# Patient Record
Sex: Female | Born: 1950 | Race: White | Hispanic: No | Marital: Married | Smoking: Never smoker
Health system: Southern US, Community
[De-identification: ages and names within clinical notes are randomized; demographics above are authoritative.]

## PROBLEM LIST (undated history)

## (undated) DIAGNOSIS — E039 Hypothyroidism, unspecified: Secondary | ICD-10-CM

## (undated) DIAGNOSIS — Z9889 Other specified postprocedural states: Secondary | ICD-10-CM

## (undated) DIAGNOSIS — D649 Anemia, unspecified: Secondary | ICD-10-CM

## (undated) DIAGNOSIS — Z972 Presence of dental prosthetic device (complete) (partial): Secondary | ICD-10-CM

## (undated) DIAGNOSIS — I10 Essential (primary) hypertension: Secondary | ICD-10-CM

## (undated) DIAGNOSIS — C801 Malignant (primary) neoplasm, unspecified: Secondary | ICD-10-CM

## (undated) DIAGNOSIS — G589 Mononeuropathy, unspecified: Secondary | ICD-10-CM

## (undated) DIAGNOSIS — T7840XA Allergy, unspecified, initial encounter: Secondary | ICD-10-CM

## (undated) DIAGNOSIS — R42 Dizziness and giddiness: Secondary | ICD-10-CM

## (undated) DIAGNOSIS — K219 Gastro-esophageal reflux disease without esophagitis: Secondary | ICD-10-CM

## (undated) DIAGNOSIS — G5790 Unspecified mononeuropathy of unspecified lower limb: Secondary | ICD-10-CM

## (undated) DIAGNOSIS — M1990 Unspecified osteoarthritis, unspecified site: Secondary | ICD-10-CM

## (undated) DIAGNOSIS — Z8619 Personal history of other infectious and parasitic diseases: Secondary | ICD-10-CM

## (undated) DIAGNOSIS — E079 Disorder of thyroid, unspecified: Secondary | ICD-10-CM

## (undated) DIAGNOSIS — E785 Hyperlipidemia, unspecified: Secondary | ICD-10-CM

## (undated) DIAGNOSIS — Z201 Contact with and (suspected) exposure to tuberculosis: Secondary | ICD-10-CM

## (undated) DIAGNOSIS — R112 Nausea with vomiting, unspecified: Secondary | ICD-10-CM

## (undated) HISTORY — DX: Gastro-esophageal reflux disease without esophagitis: K21.9

## (undated) HISTORY — DX: Allergy, unspecified, initial encounter: T78.40XA

## (undated) HISTORY — DX: Mononeuropathy, unspecified: G58.9

## (undated) HISTORY — PX: EXPLORATORY LAPAROTOMY: SUR591

## (undated) HISTORY — PX: MASTECTOMY: SHX3

## (undated) HISTORY — DX: Unspecified osteoarthritis, unspecified site: M19.90

## (undated) HISTORY — PX: DIAGNOSTIC LAPAROSCOPY: SUR761

## (undated) HISTORY — DX: Hyperlipidemia, unspecified: E78.5

## (undated) HISTORY — PX: TUBAL LIGATION: SHX77

## (undated) HISTORY — DX: Disorder of thyroid, unspecified: E07.9

## (undated) HISTORY — PX: EYE SURGERY: SHX253

## (undated) HISTORY — DX: Essential (primary) hypertension: I10

## (undated) HISTORY — DX: Personal history of other infectious and parasitic diseases: Z86.19

## (undated) HISTORY — PX: WRIST SURGERY: SHX841

---

## 1988-12-06 HISTORY — PX: CHOLECYSTECTOMY: SHX55

## 2002-10-02 LAB — HM PAP SMEAR: HM PAP: NEGATIVE

## 2004-05-07 ENCOUNTER — Encounter: Admission: RE | Admit: 2004-05-07 | Discharge: 2004-05-07 | Payer: Self-pay | Admitting: Family Medicine

## 2005-08-19 ENCOUNTER — Ambulatory Visit: Payer: Self-pay

## 2005-09-01 ENCOUNTER — Ambulatory Visit: Payer: Self-pay

## 2005-11-15 ENCOUNTER — Other Ambulatory Visit: Payer: Self-pay

## 2005-11-18 ENCOUNTER — Ambulatory Visit: Payer: Self-pay

## 2006-03-21 ENCOUNTER — Ambulatory Visit: Payer: Self-pay | Admitting: Surgery

## 2006-08-29 ENCOUNTER — Ambulatory Visit: Payer: Self-pay | Admitting: Surgery

## 2007-02-20 ENCOUNTER — Ambulatory Visit: Payer: Self-pay | Admitting: Gastroenterology

## 2007-02-20 LAB — HM COLONOSCOPY

## 2007-06-30 ENCOUNTER — Ambulatory Visit: Payer: Self-pay | Admitting: Family Medicine

## 2008-04-09 ENCOUNTER — Ambulatory Visit: Payer: Self-pay

## 2009-04-16 ENCOUNTER — Ambulatory Visit: Payer: Self-pay | Admitting: Family Medicine

## 2009-07-25 ENCOUNTER — Ambulatory Visit: Payer: Self-pay | Admitting: Family Medicine

## 2009-08-06 ENCOUNTER — Ambulatory Visit: Payer: Self-pay | Admitting: Family Medicine

## 2009-09-18 ENCOUNTER — Ambulatory Visit: Payer: Self-pay | Admitting: Family Medicine

## 2009-09-18 LAB — HM MAMMOGRAPHY

## 2009-09-26 ENCOUNTER — Ambulatory Visit: Payer: Self-pay | Admitting: Family Medicine

## 2009-10-13 ENCOUNTER — Ambulatory Visit: Payer: Self-pay

## 2009-10-16 ENCOUNTER — Ambulatory Visit: Payer: Self-pay

## 2009-12-06 HISTORY — PX: BREAST SURGERY: SHX581

## 2010-03-19 ENCOUNTER — Ambulatory Visit: Payer: Self-pay | Admitting: Surgery

## 2010-09-22 ENCOUNTER — Ambulatory Visit: Payer: Self-pay | Admitting: Surgery

## 2010-12-18 ENCOUNTER — Ambulatory Visit: Payer: Self-pay | Admitting: Surgery

## 2010-12-25 ENCOUNTER — Ambulatory Visit: Payer: Self-pay | Admitting: Surgery

## 2010-12-27 ENCOUNTER — Encounter: Payer: Self-pay | Admitting: *Deleted

## 2011-04-17 HISTORY — PX: OTHER SURGICAL HISTORY: SHX169

## 2013-06-26 ENCOUNTER — Ambulatory Visit: Payer: Self-pay | Admitting: Obstetrics and Gynecology

## 2013-06-26 LAB — CBC
HCT: 34.6 % — ABNORMAL LOW (ref 35.0–47.0)
HGB: 11.8 g/dL — ABNORMAL LOW (ref 12.0–16.0)
MCHC: 34.1 g/dL (ref 32.0–36.0)
RDW: 14.2 % (ref 11.5–14.5)

## 2013-06-26 LAB — BASIC METABOLIC PANEL
Anion Gap: 4 — ABNORMAL LOW (ref 7–16)
BUN: 14 mg/dL (ref 7–18)
Calcium, Total: 9.2 mg/dL (ref 8.5–10.1)
Osmolality: 274 (ref 275–301)
Potassium: 3.9 mmol/L (ref 3.5–5.1)

## 2013-07-13 ENCOUNTER — Inpatient Hospital Stay: Payer: Self-pay | Admitting: Obstetrics and Gynecology

## 2013-07-13 HISTORY — PX: ABDOMINAL HYSTERECTOMY: SHX81

## 2013-07-14 LAB — BASIC METABOLIC PANEL
Anion Gap: 6 — ABNORMAL LOW (ref 7–16)
Anion Gap: 4 — ABNORMAL LOW (ref 7–16)
BUN: 21 mg/dL — ABNORMAL HIGH (ref 7–18)
BUN: 13 mg/dL (ref 7–18)
Calcium, Total: 7.5 mg/dL — ABNORMAL LOW (ref 8.5–10.1)
Co2: 27 mmol/L (ref 21–32)
Creatinine: 1.19 mg/dL (ref 0.60–1.30)
Creatinine: 0.91 mg/dL (ref 0.60–1.30)
EGFR (African American): 57 — ABNORMAL LOW
EGFR (African American): >60
Glucose: 144 mg/dL — ABNORMAL HIGH (ref 65–99)
Osmolality: 277 (ref 275–301)
Potassium: 3.8 mmol/L (ref 3.5–5.1)
Potassium: 3.8 mmol/L (ref 3.5–5.1)
Sodium: 134 mmol/L — ABNORMAL LOW (ref 136–145)

## 2013-07-15 LAB — BASIC METABOLIC PANEL
Anion Gap: 7 (ref 7–16)
Calcium, Total: 8.2 mg/dL — ABNORMAL LOW (ref 8.5–10.1)
Co2: 27 mmol/L (ref 21–32)
EGFR (African American): >60
Osmolality: 272 (ref 275–301)
Potassium: 4.1 mmol/L (ref 3.5–5.1)
Sodium: 136 mmol/L (ref 136–145)

## 2013-07-15 LAB — CBC WITH DIFFERENTIAL/PLATELET
Basophil #: 0 10*3/uL (ref 0.0–0.1)
Eosinophil #: 0.1 10*3/uL (ref 0.0–0.7)
Eosinophil %: 0.6 %
HCT: 25.6 % — ABNORMAL LOW (ref 35.0–47.0)
MCH: 29.2 pg (ref 26.0–34.0)
MCHC: 34.4 g/dL (ref 32.0–36.0)
Monocyte %: 6.2 %
Neutrophil #: 9.7 10*3/uL — ABNORMAL HIGH (ref 1.4–6.5)
Platelet: 191 10*3/uL (ref 150–440)
RBC: 3.02 10*6/uL — ABNORMAL LOW (ref 3.80–5.20)
WBC: 12.1 10*3/uL — ABNORMAL HIGH (ref 3.6–11.0)

## 2013-07-17 LAB — PATHOLOGY REPORT

## 2015-01-22 LAB — LIPID PANEL
CHOLESTEROL: 261 mg/dL — AB (ref 0–200)
HDL: 45 mg/dL (ref 35–70)
LDL Cholesterol: 175 mg/dL
Triglycerides: 204 mg/dL — AB (ref 40–160)

## 2015-01-22 LAB — BASIC METABOLIC PANEL
BUN: 14 mg/dL (ref 4–21)
Creatinine: 0.8 mg/dL (ref 0.5–1.1)
GLUCOSE: 110 mg/dL
POTASSIUM: 4.4 mmol/L (ref 3.4–5.3)
Sodium: 146 mmol/L (ref 137–147)

## 2015-01-22 LAB — HEPATIC FUNCTION PANEL
ALT: 25 U/L (ref 7–35)
AST: 20 U/L (ref 13–35)

## 2015-01-22 LAB — TSH: TSH: 2.44 u[IU]/mL (ref 0.41–5.90)

## 2015-03-28 NOTE — Op Note (Signed)
PATIENT NAME:  Kaylee Reyes, Kaylee Reyes MR#:  [id number] DATE OF BIRTH:  1951-01-11  DATE OF PROCEDURE:  07/13/2013  PREOPERATIVE DIAGNOSES: Postmenopausal bleeding and fibroids with large cervical polyp.  POSTOPERATIVE DIAGNOSES: Postmenopausal bleeding and fibroids with large cervical polyp, plus large cervical fibroid between the bladder and the cervix.   PROCEDURE PERFORMED: Laparoscopic supracervical hysterectomy, bilateral salpingo-oophorectomy completed, then emergent exploratory laparotomy for pelvic hemorrhaging and cervical myomectomy to remove large cervical fibroid.   ESTIMATED BLOOD LOSS: Approximately 750 mL.   ANESTHESIA: General endotracheal anesthesia.   SURGEON: Delsa Sale, M.D.   ASSISTANT: Prentice Docker, MD.   FINDINGS: Approximately 8 weeks' size uterus with large cervical fibroid below the level of the bladder reflection along a very long cervix to very deep vagina. Normal ovaries bilaterally. Abdominal and bowel adhesions at the level of the umbilicus. Very mobile, protuberant and active gas-filled bowel.   PROCEDURE: The patient was taken to the operating room and placed in the supine position. After adequate general endotracheal anesthesia was instilled, the patient was prepped and draped in the usual sterile fashion. A side-opening speculum was placed in the patient's vagina, but found to be way too short to get to the patient's cervix. Two Deavers were then used to find the cervix. The cervix was grasped with a single-tooth tenaculum and the cup retractor was placed on the patient's cervix. The retractors were removed and attention was paid to the umbilicus. It was injected with Marcaine. Incision was made. Veress needle was placed. Hang drop test, fluid instillation test, and fluid aspiration test showed proper placement of the Veress needle. CO2 was then attached and placed on low flow. When tympany was heard around the liver, CO2 was placed on high flow. A 10 mm trocar  port was then placed into the umbilicus under direct visualization. The aforementioned findings were seen and the adhesions were seen. Under direct visualization, a 10 mm trocar port was placed in the lower left quadrant and a 5 mm trocar port was placed in the lower right quadrant. The patient was placed in Trendelenburg; however, she did not tolerate it very well and her PEEP was elevated. The bowels were found to be obscuring the vision, as were the adhesions, so the Harmonic scalpel was placed and the adhesions were removed as was the omentum from the adhesions' connection to the anterior abdominal wall. A fourth trocar port was placed in the midline of the lower pelvis, specifically to hold back the bowels as they were making the surgery incredibly difficult.   The uterine retractor was then used to move the uterus. It was very difficult to move due to the positioning of the uterus and the size of the patient. The Harmonic scalpel was used to cut across the infundibulopelvic ligament, first on the left, then on the right after cauterization with the Kleppinger. Serial bites were then taken down the sides of the broad ligament and the round ligament was traversed and transected bilaterally. Good hemostasis was identified. Serial bites were then taken down to the edge of the last uterine artery. The uterine artery was skeletonized, grasped with the Kleppinger and cauterized. The Kleppinger was then used to cauterize the right uterine artery and the (Dictation Anomaly) harmonic scalpel was used to cut across this artery. At this time, there were some accessory blood vessels that were noted. The Kleppinger was attempted to be used to cauterize the blood vessels, but the blood vessels would not stop bleeding. The ureters were not able  to be seen at this time, although they had been identified prior to this point at the IT ligament crossing over the pelvic rim. The ureters were not able to be seen and the  distortion of the ureters due to the  (Dictation Anomaly) cervical fibroids and the bleeding (Dictation Anomaly) the case was then converted to an open case. The patient was opened emergently. The (Dictation Anomaly) muscle bellies were separated after the rectus fascia was cut. The peritoneum was grasped and entered sharply. Bowels were packed back and (Dictation Anomaly) the cat claw clamp was used to grasp the (Dictation Anomaly) uterus and hand  pressure was used to manually stop the bleeding.. At this time, the bowels were packed back with laparotomy sponges were placed to remove the uterus from the pelvis.   Attention was then turned to the uterus. The bowels were packed back with moist laparotomy sponges. (Dictation Anomaly) the mobius rtetractor ewas placed. The uterus was grasped with a cat's claw grasper and (Dictation Anomaly) and a knife was used was used to cut the uterus off the top of the fibroid and the cervix was found to be posterior with (Dictation Anomaly) right lateral rotation. (Dictation Anomaly) interrupted Sutures were placed across the tops of the blood vessels on the fibroids to stop the bleeding. Attention was then turned to the cervical fibroid. (Dictation Anomaly) which was grasoed with a towel clamp and then injected with ptiressin was then used to cut across the anterior and superior aspects of the peritoneum. The peritoneum was sharply and bluntly dissected off the fibroid (Dictation Anomaly) and the firoid was delivered out of the pocket. The blood vessels were grasped and sutured as they were cauterized. The anatomy was then placed midline where it had been placed off to the left lateral (Dictation Anomaly) side due to the  fibroid. The patient's fibroids were taken off the table and the patient's ureter was identified by the right side. The top of the cervix was closed with interrupted figure-of-eight sutures for hemostasis. There was a lot of  (Dictation Anomaly) excessive  tissue in the area from the dissection of the fibroids; and after irrigation of the pelvis, removal of all of the lap sponges and (Dictation Anomaly) mobius  retractor, Arista was placed on the surface. Gelfoam was placed behind the cervix where there was raw edges and (Dictation Anomaly) anteriopr to the bladder. The bowels were allowed to fall back into the pelvis, (Dictation Anomaly)and the omentum was placed over the bowels,the muscel bellies were closed with vicryl suture, and then the fascia was closed  the On-Q pain pump was placed prior to the fascial closing. Good hemostasis was identified. Plain gut suture was used to approximate Camper's and Scarpa's fascia; 4-0 Monocryl was placed to approximate the skin edges. Steri-Strips were placed with Benzoin. A 4 x 4 was placed on the On-Q pain pump. Clear urine was noted in the Foley bag and then the Foley bag was removed and the cystoscopy was performed to be sure that there was patency of the ureters. Bilaterally there was blue efflux found in both ureters bilaterally with no suture in the bladder (Dictation Anomaly) . The Foley catheter was placed back into the bladder, and the patient was laid supine and taken to recovery after emergent x-ray. Counts were not able to be done. All counts were correct. The x-ray cleared the belly. No needles, No sponges. No laps.    ____________________________ Delsa Sale, MD cck:aw D: 07/16/2013 01:16:00 ET  T: 07/16/2013 09:56:53 ET JOB#: [id number]  cc: Delsa Sale, MD, <Dictator> Delsa Sale MD ELECTRONICALLY SIGNED 07/20/2013 23:05

## 2015-03-28 NOTE — Op Note (Signed)
PATIENT NAME:  Kaylee Reyes, Kaylee Reyes MR#:  [id number] DATE OF BIRTH:  06-07-1951  DATE OF PROCEDURE:  07/16/2013  PREOPERATIVE DIAGNOSES: Postmenopausal bleeding and fibroids with large cervical polyp.  POSTOPERATIVE DIAGNOSES: Postmenopausal bleeding and fibroids with large cervical polyp, plus large cervical fibroid between the bladder and the cervix.   PROCEDURE PERFORMED: Laparoscopic supracervical hysterectomy, bilateral salpingo-oophorectomy completed, then emergent exploratory laparotomy for pelvic hemorrhaging and cervical myomectomy to remove large cervical fibroid.   ESTIMATED BLOOD LOSS: Approximately 750 mL.   ANESTHESIA: General endotracheal anesthesia.   SURGEON: Delsa Sale, M.D.   ASSISTANT: Prentice Docker, MD.   FINDINGS: Approximately 8 weeks' size uterus with large cervical fibroid below the level of the bladder reflection along a very long cervix to very deep vagina. Normal ovaries bilaterally. Abdominal and bowel adhesions at the level of the umbilicus. Very mobile, protuberant and active gas-filled bowel.   PROCEDURE: The patient was taken to the operating room and placed in the supine position. After adequate general endotracheal anesthesia was instilled, the patient was prepped and draped in the usual sterile fashion. A side-opening speculum was placed in the patient's vagina, but found to be way too short to get to the patients cervix. Two Deavers were then used to find the cervix. The cervix was grasped with a single-tooth tenaculum and the cup retractor was placed on the patient's cervix. The retractors were removed and attention was paid to the umbilicus. It was injected with Marcaine. Incision was made. Veress needle was placed. Hang drop test, fluid instillation test, and fluid aspiration test showed proper placement of the Veress needle. CO2 was then attached and placed on low flow. When tympany was heard around the liver, CO2 was placed on high flow. A 10 mm trocar  port was then placed into the umbilicus under direct visualization. The aforementioned findings were seen and the adhesions were seen. Under direct visualization, a 10 mm trocar port was placed in the lower left quadrant and a 5 mm trocar port was placed in the lower right quadrant. The patient was placed in Trendelenburg; however, she did not tolerate it very well and her PEEP was elevated. The bowels were found to be obscuring the vision, as were the adhesions, so the Harmonic scalpel was placed and the adhesions were removed as was the omentum from the adhesions' connection to the anterior abdominal wall. A fourth trocar port was placed in the midline of the lower pelvis, specifically to hold back the bowels as they were making the surgery incredibly difficult.   The uterine retractor was then used to move the uterus. It was very difficult to move due to the positioning of the uterus and the size of the patient. The Harmonic scalpel was used to cut across the infundibulopelvic ligament, first on the left, then on the right after cauterization with the Kleppinger. Serial bites were then taken down the sides of the broad ligament and the round ligament was traversed and transected bilaterally. Good hemostasis was identified. Serial bites were then taken down to the edge of the last uterine artery. The uterine artery was skeletonized, grasped with the Kleppinger and cauterized. The Kleppinger was then used to cauterize the right uterine artery and the (Dictation Anomaly) <<MISSING TEXT>> was used to cut across this artery. At this time, there were some accessory blood vessels that were noted. The Kleppinger was attempted to be used to cauterize the blood vessels, but the blood vessels would not stop bleeding. The ureters were not able  to be seen at this time, although they had been identified prior to this point at the IT ligament crossing over the pelvic rim. The ureters were not able to be seen and the  distortion of the ureters due to the  (Dictation Anomaly) <<MISSING TEXT>> fibroids and the bleeding (Dictation Anomaly) <<MISSING TEXT>> to an open case. The patient was opened emergently. The (Dictation Anomaly) <<MISSING TEXT>> were separated after the rectus fascia was cut. The peritoneum was grasped and entered sharply. Bowels were packed back and (Dictation Anomaly) <<MISSING TEXT>> used to grasp the (Dictation Anomaly) <<MISSING TEXT>> pressure. At this time, the bowels were packed back with laparotomy sponges to remove the bladder from the pelvis.   Attention was then turned to the uterus. The bowels were packed back with moist laparotomy sponges. (Dictation Anomaly) <<MISSING TEXT>>. The uterus was grasped with a cats claw grasper and (Dictation Anomaly) <<MISSING TEXT>> was used to cut the uterus off the top of the fibroid and the cervix was found posterior with (Dictation Anomaly) rotation. (Dictation Anomaly) <<MISSING TEXT>> Sutures were placed across the tops of the blood vessels on the fibroids to stop the bleeding. Attention was then turned to the cervical fibroid. (Dictation Anomaly) <<MISSING TEXT>> was then used to cut across the anterior and superior aspects of the peritoneum. The peritoneum was sharply and bluntly dissected off the fibroid (Dictation Anomaly) <<MISSING TEXT>> out of the pocket. The blood vessels were grasped and sutured as they were cauterized. The anatomy was then placed midline where it had been placed off to the left lateral (Dictation Anomaly) <<MISSING TEXT>> fibroid. The patients fibroids were taken off the table and the patients ureter was identified by the right side. The top of the cervix was closed with interrupted figure-of-eight sutures for hemostasis. There was a lot of  (Dictation Anomaly) <<MISSING TEXT>> tissue in the area from the dissection of the fibroids; and after irrigation of the pelvis, removal of all of the lap sponges and (Dictation Anomaly) <<MISSING  TEXT>> retractor, Arista was placed on the surface. Gelfoam was placed behind the cervix where there was raw edges and (Dictation Anomaly) <<MISSING TEXT>> bladder. The bowels were allowed to fall back into the pelvis, (Dictation Anomaly)<<MISSING TEXT>>  With the fascia closed, the On-Q pain pump was placed prior to the fascial closing. The muscle bellies were approximated. Good hemostasis was identified. Plain gut suture was used to approximate Camper's and Scarpa's fascia; 4-0 Monocryl was placed to approximate the skin edges. Steri-Strips were placed with Benzoin. A 4 x 4 was placed on the On-Q pain pump. Clear urine was noted in the Foley bag and then the Foley bag was removed and the cystoscopy was performed to be sure that there was patency of the ureters. Bilaterally there was blue efflux found in both ureters bilaterally with no suture in the bladder (Dictation Anomaly) <<MISSING TEXT>>. The Foley catheter was placed back into the bladder, and the patient was laid supine and taken to recovery after emergent x-ray. Counts were not able to be done. All counts were correct. The x-ray cleared the belly. No needles, No sponges. No laps.    ____________________________ Delsa Sale, MD cck:aw D: 07/16/2013 01:16:53 ET T: 07/16/2013 09:56:53 ET JOB#: [id number]  cc: Delsa Sale, MD, <Dictator>

## 2015-07-10 ENCOUNTER — Other Ambulatory Visit: Payer: Self-pay | Admitting: Family Medicine

## 2015-07-10 ENCOUNTER — Telehealth: Payer: Self-pay | Admitting: Family Medicine

## 2015-07-10 MED ORDER — MAGIC MOUTHWASH: ORAL | Status: DC

## 2015-07-10 NOTE — Telephone Encounter (Signed)
Last ov was on 01/21/2015

## 2015-07-10 NOTE — Telephone Encounter (Signed)
Called [facility] back and give correct medication information.

## 2015-07-10 NOTE — Telephone Encounter (Signed)
It should be magic mouthwash. 156mL Thanks.!

## 2015-07-10 NOTE — Telephone Encounter (Signed)
Spoke with Colletta Maryland from [place] regarding below note, stephanie stated that [facility] has sent a magic mouthwash prescription request, but instead the prescription that was sent was Hydrocortizone Micronized powder, she wants verification of order.  Thanks,

## 2015-07-10 NOTE — Telephone Encounter (Signed)
[facility] called about a prescription mix up.  Please call them back.  They needed magic mouthwash and recd. A rx for something else.  Thanks, [employer]

## 2015-07-28 ENCOUNTER — Telehealth: Payer: Self-pay | Admitting: Family Medicine

## 2015-07-28 DIAGNOSIS — Z87898 Personal history of other specified conditions: Secondary | ICD-10-CM

## 2015-07-28 NOTE — Telephone Encounter (Signed)
Patient called and left message on voicemail stating that she is needing a Referral for a bilateral diagnostic mammogram. Please call patient for additional information.  [phone]    KB

## 2015-07-29 NOTE — Telephone Encounter (Signed)
Please advise referral? Patient has had breast biopsy and lump or mass in breast in the past.

## 2015-07-30 NOTE — Telephone Encounter (Signed)
Please order bilateral diagnostic mammogram. Thanks.

## 2015-07-30 NOTE — Telephone Encounter (Signed)
Pt is calling back.  CH#[phone]/[id number]

## 2015-07-30 NOTE — Telephone Encounter (Signed)
Patient is calling back to see if referral has been done yet? Please advise?

## 2015-07-31 ENCOUNTER — Other Ambulatory Visit: Payer: Self-pay | Admitting: Family Medicine

## 2015-07-31 DIAGNOSIS — Z1231 Encounter for screening mammogram for malignant neoplasm of breast: Secondary | ICD-10-CM

## 2015-07-31 NOTE — Telephone Encounter (Signed)
Patient has appt 08/06/2015 with [place].

## 2015-07-31 NOTE — Telephone Encounter (Signed)
Need to call [facility] and schedule appt for pt.

## 2015-08-01 NOTE — Telephone Encounter (Signed)
Due to patient's insurance she will need to go to [facility] instead of [place]. Canceled appt to [facility] and scheduled appt with [facility].

## 2015-08-06 ENCOUNTER — Ambulatory Visit: Payer: Self-pay

## 2015-08-15 ENCOUNTER — Telehealth: Payer: Self-pay | Admitting: Family Medicine

## 2015-08-15 NOTE — Telephone Encounter (Signed)
Pt called saying she is going out of the state on the [place]rd.   She said that you were referring her to another doctor and for a needle biopsy.  She wants to schedule her apt's accordingly.  Her call back is [phone]  Thanks Con Memos.

## 2015-08-18 ENCOUNTER — Other Ambulatory Visit: Payer: Self-pay | Admitting: Family Medicine

## 2015-08-18 DIAGNOSIS — R928 Other abnormal and inconclusive findings on diagnostic imaging of breast: Secondary | ICD-10-CM

## 2015-08-18 DIAGNOSIS — R599 Enlarged lymph nodes, unspecified: Secondary | ICD-10-CM

## 2015-08-18 NOTE — Telephone Encounter (Addendum)
The patient does not have an appt setup and does need a referral. Patient stated that she prefers Dr. Pat Patrick if he is in her network and if he does biopsy's.

## 2015-08-18 NOTE — Progress Notes (Unsigned)
Please refer surgery for abnormal mammogram and enlarged cervical lymph node. She prefers [place].

## 2015-08-19 ENCOUNTER — Other Ambulatory Visit: Payer: Self-pay | Admitting: Family Medicine

## 2015-08-20 ENCOUNTER — Ambulatory Visit (INDEPENDENT_AMBULATORY_CARE_PROVIDER_SITE_OTHER): Payer: 59 | Admitting: Surgery

## 2015-08-20 ENCOUNTER — Encounter: Payer: Self-pay | Admitting: Surgery

## 2015-08-20 VITALS — BP 162/92 | HR 76 | Temp 98.3°F | Ht 67.0 in | Wt 283.0 lb

## 2015-08-20 DIAGNOSIS — N63 Unspecified lump in unspecified breast: Secondary | ICD-10-CM

## 2015-08-20 MED ORDER — ONDANSETRON HCL 4 MG PO TABS: 4.0000 mg | ORAL_TABLET | Freq: Every day | ORAL | Status: DC | PRN

## 2015-08-20 NOTE — Patient Instructions (Signed)
I will call you and arrange testing/follow-up as soon as I make a decision as to what we need to do in the case of your breast mass.

## 2015-08-20 NOTE — Progress Notes (Signed)
Surgical Consultation  08/20/2015  Kaylee Reyes is an 64 y.o. female.   Chief Complaint  Patient presents with  . Other     HPI: She has a newly discovered right breast mass. She had a biopsy on the right side with benign findings 6-8 years ago. She noted the gradual onset of some discomfort in that upper outer quadrant and the development of some solid area. 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  She's had no other new breast symptoms. She denies any breast infection breast drainage recent breast intervention. She has some joint symptoms that have come on since she started her statin medications but otherwise has not had any other major medical change in her history.  She is having marked nausea over the last several weeks. She thinks it may be related to stress from the knowledge she has an abnormal mammogram.  Past Medical History  Diagnosis Date  . Hypertension   . GERD (gastroesophageal reflux disease)   . Thyroid disease   . Hyperlipidemia     Past Surgical History  Procedure Laterality Date  . Abdominal hysterectomy  2013    Total  . Breast surgery  2011  . Cholecystectomy  1990  . Wrist surgery    . Cesarean section  1988  . Exploratory laparotomy      Left breast    Family History  Problem Relation Age of Onset  . Hypertension Mother   . Thyroid disease Mother   . Hypertension Brother   . Diabetes Brother     Social History:  reports that she has never smoked. She does not have any smokeless tobacco history on file. She reports that she does not drink alcohol or use illicit drugs.  Allergies:  Allergies  Allergen Reactions  . Penicillins Shortness Of Breath  . Pravastatin Sodium     Muscle pain, muscle cramps, muscle weakness    Medications reviewed.     Review of Systems  Constitutional: Negative.   Eyes: Negative.   Respiratory: Negative.   Cardiovascular: Negative.    Gastrointestinal: Positive for nausea. Negative for vomiting.  Genitourinary: Negative.   Musculoskeletal: Positive for myalgias, back pain, joint pain and neck pain.  Skin: Negative.   Neurological: Negative.   Psychiatric/Behavioral: Negative.        BP 162/92 mmHg  Pulse 76  Temp(Src) 98.3 F (36.8 C) (Oral)  Ht 5\' 7"  (1.702 m)  Wt 128.368 kg (283 lb)  BMI 44.31 kg/m2  Physical Exam  Constitutional: She is oriented to person, place, and time and well-developed, well-nourished, and in no distress.  Moderately overweight  HENT:  Head: Normocephalic and atraumatic.  Eyes: Conjunctivae are normal. Pupils are equal, round, and reactive to light.  Neck: Normal range of motion. Neck supple.  Cardiovascular: Normal rate and regular rhythm.   Pulmonary/Chest: Effort normal and breath sounds normal.  Abdominal: Soft. Bowel sounds are normal.  Musculoskeletal: Normal range of motion. She exhibits no edema or tenderness.  Neurological: She is alert and oriented to person, place, and time.  Skin: Skin is warm and dry.  Psychiatric: Mood and judgment normal.   she has a firm palpable area above the nipple slightly lateral to it and the site of her previous scar. There is not a dominant mass but a large area of induration with some skin dimpling. I'm not certain whether dimpling is related to her previous surgery or to the development of this mass.  No results found for this or any previous visit (from the past 48 hour(s)). No results found.  Assessment/Plan: 1. Breast mass in female I've independently reviewed her films. She does need an ultrasound-guided biopsy of this area. I talk with her length about the options and were going to set her up for biopsy. We will make some decisions with regard to further intervention but will likely have her see the cancer doctors for assistance in developing a treatment plan. She is in agreement   Dia Crawford III dermatitis

## 2015-08-21 ENCOUNTER — Encounter: Payer: Self-pay | Admitting: Family Medicine

## 2015-08-21 ENCOUNTER — Telehealth: Payer: Self-pay | Admitting: Surgery

## 2015-08-21 NOTE — Telephone Encounter (Signed)
Patient calling to see when her appt for her guided biopsy is scheduled for please call patient back, She was trying to have it done before she left out of town next saturday

## 2015-08-21 NOTE — Telephone Encounter (Signed)
Spoke with Dr. Pat Patrick at this time. We are in the process of getting a plan together for Biopsy and will call patient on Monday of next week.  Called patient back at this time and information above was given.  Patient states that she will be out of town 9/24-9/30 and would like to have this done prior to going out of town.  Explained that we will try our best to get this accomplished before this date.  Will return phone call to patient as soon as I know what definitive plan is.

## 2015-08-26 ENCOUNTER — Telehealth: Payer: Self-pay | Admitting: Surgery

## 2015-08-26 NOTE — Telephone Encounter (Signed)
Called [facility] to schedule breast biopsy and they stated that patient could go to [facility] or [facility]

## 2015-08-26 NOTE — Telephone Encounter (Signed)
She needs an appointment for a biopsy. She is going out of town and would like to know exactly what is going on and what needs to be done.

## 2015-08-26 NOTE — Telephone Encounter (Signed)
Amber, just so you know, patient is not able to have her breast biopsy done at [facility] because of her insurance. Therefore, Dr. Pat Patrick was going to ask Dr. Azalee Course if it was a possibility that she could do it in the office. When you e-mail him, please remind him about this.  However, if this is not possible, [facility] is able to do it. Patient's insurance covers this facility. Thank you.

## 2015-08-27 ENCOUNTER — Other Ambulatory Visit: Payer: Self-pay | Admitting: *Deleted

## 2015-08-27 MED ORDER — HYDROCHLOROTHIAZIDE 25 MG PO TABS: 25.0000 mg | ORAL_TABLET | Freq: Every day | ORAL | Status: DC

## 2015-08-27 NOTE — Telephone Encounter (Signed)
Patient decided to go to [facility] to have this done since she is covered by her insurance. Patient stated that she would call us when her biopsy would be covered once she schedules it.

## 2015-08-28 ENCOUNTER — Encounter: Payer: Self-pay | Admitting: Family Medicine

## 2015-08-28 ENCOUNTER — Telehealth: Payer: Self-pay

## 2015-08-28 NOTE — Telephone Encounter (Signed)
Called patient asking her if she has been able to schedule her breast biopsy with [facility]. Patient stated that according to her insurance company, she is not covered by [employer]. Patient then stated that she asked her insurance company to send her a list of different locations that she is covered to have her breast biopsy. Patient also asked me if I could help her call her insurance company and hopefully get some answers. I told her that I would call her as soon as I had some information.   [facility] ID: [id number] Group: [id number] Customer Service: [phone]

## 2015-09-03 NOTE — Telephone Encounter (Signed)
Called patient's insurance and asked where patient was able to have her U/S Breast Biopsy. The insurance representative gave me  [facility] with Dr. Jamal Collin. I then called patient to let her know and she stated that if Dr. Jamal Collin was able to see her next week, then she would go there, if not she will continue on going to [facility] to have her U/S Breast Biopsy. After contacting Dr. Jamal Collin, I was given an appointment for 09/16/2015. Then I called patient to let her know that this appointment was for a consult first, she then told me that she would rather go to [place] and have this done. So, after all, I called Dr. Angie Fava office and cancelled her appointment per patient's request and kept her appointment for [place] on 09/19/2015 at 8:30 AM.

## 2015-09-06 DIAGNOSIS — C801 Malignant (primary) neoplasm, unspecified: Secondary | ICD-10-CM

## 2015-09-06 DIAGNOSIS — Z853 Personal history of malignant neoplasm of breast: Secondary | ICD-10-CM

## 2015-09-06 HISTORY — DX: Personal history of malignant neoplasm of breast: Z85.3

## 2015-09-06 HISTORY — DX: Malignant (primary) neoplasm, unspecified: C80.1

## 2015-09-16 ENCOUNTER — Ambulatory Visit: Payer: Self-pay | Admitting: General Surgery

## 2015-09-25 DIAGNOSIS — I10 Essential (primary) hypertension: Secondary | ICD-10-CM | POA: Insufficient documentation

## 2015-09-25 DIAGNOSIS — E66813 Obesity, class 3: Secondary | ICD-10-CM | POA: Insufficient documentation

## 2015-09-25 DIAGNOSIS — E669 Obesity, unspecified: Secondary | ICD-10-CM | POA: Insufficient documentation

## 2015-09-25 DIAGNOSIS — K219 Gastro-esophageal reflux disease without esophagitis: Secondary | ICD-10-CM | POA: Insufficient documentation

## 2015-09-25 DIAGNOSIS — Z8711 Personal history of peptic ulcer disease: Secondary | ICD-10-CM

## 2015-09-25 DIAGNOSIS — E039 Hypothyroidism, unspecified: Secondary | ICD-10-CM | POA: Insufficient documentation

## 2015-09-25 DIAGNOSIS — N63 Unspecified lump in unspecified breast: Secondary | ICD-10-CM | POA: Insufficient documentation

## 2015-09-25 DIAGNOSIS — L659 Nonscarring hair loss, unspecified: Secondary | ICD-10-CM | POA: Insufficient documentation

## 2015-09-25 DIAGNOSIS — K760 Fatty (change of) liver, not elsewhere classified: Secondary | ICD-10-CM

## 2015-09-25 DIAGNOSIS — L309 Dermatitis, unspecified: Secondary | ICD-10-CM | POA: Insufficient documentation

## 2015-09-25 DIAGNOSIS — E7800 Pure hypercholesterolemia, unspecified: Secondary | ICD-10-CM | POA: Insufficient documentation

## 2015-09-25 DIAGNOSIS — J309 Allergic rhinitis, unspecified: Secondary | ICD-10-CM | POA: Insufficient documentation

## 2015-09-25 DIAGNOSIS — Z6835 Body mass index (BMI) 35.0-35.9, adult: Secondary | ICD-10-CM | POA: Insufficient documentation

## 2015-09-25 HISTORY — DX: Personal history of peptic ulcer disease: Z87.11

## 2015-09-26 ENCOUNTER — Ambulatory Visit (INDEPENDENT_AMBULATORY_CARE_PROVIDER_SITE_OTHER): Payer: 59 | Admitting: Family Medicine

## 2015-09-26 ENCOUNTER — Encounter: Payer: Self-pay | Admitting: Family Medicine

## 2015-09-26 VITALS — BP 130/88 | HR 70 | Temp 98.8°F | Resp 16 | Wt 296.0 lb

## 2015-09-26 DIAGNOSIS — B372 Candidiasis of skin and nail: Secondary | ICD-10-CM

## 2015-09-26 MED ORDER — KETOCONAZOLE 2 % EX CREA: 1.0000 "application " | TOPICAL_CREAM | Freq: Every day | CUTANEOUS | Status: DC

## 2015-09-26 NOTE — Progress Notes (Signed)
Patient: Kaylee Reyes Female    DOB: 1951/01/16   64 y.o.   MRN: [id number] Visit Date: 09/26/2015  Today's Provider: Lelon Huh, MD   Chief Complaint  Patient presents with  . Rash   Subjective:    Rash This is a new problem. The current episode started 1 to 4 weeks ago (x3 weeks). The problem has been gradually worsening since onset. The affected locations include the abdomen. The rash is characterized by burning and redness. She was exposed to nothing. Associated symptoms include congestion. Pertinent negatives include no diarrhea, fatigue, fever or shortness of breath. Past treatments include antihistamine, anti-itch cream and moisturizer. The treatment provided no relief.      Allergies  Allergen Reactions  . Penicillins Shortness Of Breath  . Pravastatin Sodium     Muscle pain, muscle cramps, muscle weakness   Previous Medications   ALUM & MAG HYDROXIDE-SIMETH (MAGIC MOUTHWASH) SOLN    RINSE AND SPIT ONE TEASPOONFUL BY MOUTH EVERY 3 TO 4 HOURS AS NEEDED   ATENOLOL (TENORMIN) 50 MG TABLET    TAKE ONE TABLET BY MOUTH ONCE DAILY   B COMPLEX VITAMINS TABLET    Take 1 tablet by mouth daily.   CHOLECALCIFEROL (VITAMIN D) 2000 UNITS TABLET    Take 2,000 Units by mouth daily.   COENZYME Q10 (COQ10) 100 MG CAPS    Take 1 capsule by mouth daily.   DOCUSATE SODIUM (COLACE) 100 MG CAPSULE    Take 100 mg by mouth daily.   FEXOFENADINE (ALLEGRA) 180 MG TABLET    Take 180 mg by mouth daily.   HYDROCHLOROTHIAZIDE (HYDRODIURIL) 25 MG TABLET    Take 1 tablet (25 mg total) by mouth daily.   LANSOPRAZOLE (PREVACID) 15 MG CAPSULE    Take 15 mg by mouth daily at 12 noon.   LEVOTHYROXINE (SYNTHROID, LEVOTHROID) 75 MCG TABLET    TAKE ONE TABLET BY MOUTH ONCE DAILY   MECLIZINE (ANTIVERT) 25 MG TABLET    TAKE ONE TABLET BY MOUTH EVERY 4 TO 6 HOURS AS NEEDED   OMEGA-3 FATTY ACIDS (FISH OIL) 1000 MG CAPS    Take 1 capsule by mouth once.   ONDANSETRON (ZOFRAN) 4 MG TABLET    Take 1  tablet (4 mg total) by mouth daily as needed for nausea or vomiting.   TRIAMCINOLONE CREAM (KENALOG) 0.5 %    Apply 1 application topically 3 (three) times daily. Apply 1 application to ear 2-3 time daily   TURMERIC CURCUMIN 500 MG CAPS    Take 1 tablet by mouth 1 day or 1 dose.    Review of Systems  Constitutional: Negative for fever and fatigue.  HENT: Positive for congestion.   Respiratory: Negative for shortness of breath.   Cardiovascular: Negative for chest pain and palpitations.  Gastrointestinal: Negative for diarrhea.  Skin: Positive for rash.    Social History  Substance Use Topics  . Smoking status: Never Smoker   . Smokeless tobacco: Not on file  . Alcohol Use: No   Objective:   BP 130/88 mmHg  Pulse 70  Temp(Src) 98.8 F (37.1 C) (Oral)  Resp 16  Wt 296 lb (134.265 kg)  SpO2 96%  Physical Exam  Extensive area of intertrigo pannus mostly left of midline    Assessment & Plan:     1. Candidiasis, intertrigo  - ketoconazole (NIZORAL) 2 % cream; Apply 1 application topically daily.  Dispense: 50 g; Refill: 0  Call if symptoms change  or if not rapidly improving.         Lelon Huh, MD  [facility]

## 2015-09-29 ENCOUNTER — Telehealth: Payer: Self-pay | Admitting: Surgery

## 2015-09-29 NOTE — Telephone Encounter (Signed)
Patient called stating that she had received a call from [facility] and was told that she had breast cancer. I told her that [facility] had not contacted [place] about it. However, I told her that we would call [facility] so they could send [place] her pathology results and then go from there.  Patient has an appointment scheduled for Wednesday 10/01/2015 with Dr. Azalee Course. Patient knows about appointment. Pathology report received.

## 2015-10-01 ENCOUNTER — Encounter: Payer: Self-pay | Admitting: Surgery

## 2015-10-01 ENCOUNTER — Ambulatory Visit (INDEPENDENT_AMBULATORY_CARE_PROVIDER_SITE_OTHER): Payer: 59 | Admitting: Surgery

## 2015-10-01 VITALS — BP 167/111 | HR 66 | Temp 98.1°F | Wt 292.0 lb

## 2015-10-01 DIAGNOSIS — B354 Tinea corporis: Secondary | ICD-10-CM

## 2015-10-01 DIAGNOSIS — C50911 Malignant neoplasm of unspecified site of right female breast: Secondary | ICD-10-CM | POA: Diagnosis not present

## 2015-10-01 DIAGNOSIS — M25569 Pain in unspecified knee: Secondary | ICD-10-CM | POA: Insufficient documentation

## 2015-10-01 DIAGNOSIS — K760 Fatty (change of) liver, not elsewhere classified: Secondary | ICD-10-CM | POA: Insufficient documentation

## 2015-10-01 HISTORY — DX: Tinea corporis: B35.4

## 2015-10-01 NOTE — Patient Instructions (Signed)
We will contact you once we have your appointment scheduled with [facility] for your Breast MRI.

## 2015-10-01 NOTE — Progress Notes (Signed)
Subjective:     Patient ID: Kaylee Reyes, female   DOB: 21-Feb-1951, 64 y.o.   MRN: [id number]  HPI   Kaylee Reyes presents for the evaluation of a breast mass. Onset of the symptoms was 5 months ago. Patient thought it was just scar tissue from previous area but then felt it getting bigger and sought treatment about a month ago.  She then had abnormal mammogram and resultant biopsy showing invasive lobular carcinoma. Contributing factors include menopause after age 38 and previous breast biopsy to same area and on opposite breast.  She does not have any family history of breast or other types of cancer.  She had two children but did not breast feed.  She did take birth control pills for a year when she was 69.   She denies any fever, chills, changes to skin of breast, puckering or discharge. She does state that there is some bruising post biopsy and some soreness.   The following portions of the patient's history were reviewed and updated as appropriate: allergies, current medications, past family history, past medical history, past social history, past surgical history and problem list.   Past Medical History  Diagnosis Date  . Hypertension   . GERD (gastroesophageal reflux disease)   . Thyroid disease   . Hyperlipidemia   . History of chicken pox    Past Surgical History  Procedure Laterality Date  . Cholecystectomy  1990  . Wrist surgery    . Cesarean section  1988  . Exploratory laparotomy      Left breast  . Abdominal hysterectomy  07/13/2013    Total. with BSO for postmenopausal bleeding and fibroids with large cervical polyp. Dr. Levy Sjogren and Dr. Glennon Mac [facility]  . Breast surgery  2011    BREAST BIOPSY  . Abdominal ultrasound  04/17/2011    Hepatomegaly with borderline slenomegaly. Suggestive of fatty liver. s/p cholecystectomy. Portions of aorta obscured   Family History  Problem Relation Age of Onset  . Hypertension Mother   . Thyroid disease Mother   . Hypertension  Brother   . Diabetes Brother    Social History   Social History  . Marital Status: Married    Spouse Name: N/A  . Number of Children: 2  . Years of Education: N/A   Social History Main Topics  . Smoking status: Never Smoker   . Smokeless tobacco: None  . Alcohol Use: No  . Drug Use: No  . Sexual Activity: Not Asked   Other Topics Concern  . None   Social History Narrative    Current outpatient prescriptions:  .  Alum & Mag Hydroxide-Simeth (MAGIC MOUTHWASH) SOLN, RINSE AND SPIT ONE TEASPOONFUL BY MOUTH EVERY 3 TO 4 HOURS AS NEEDED, Disp: 100 mL, Rfl: 1 .  atenolol (TENORMIN) 50 MG tablet, TAKE ONE TABLET BY MOUTH ONCE DAILY, Disp: 30 tablet, Rfl: 5 .  b complex vitamins tablet, Take 1 tablet by mouth daily., Disp: , Rfl:  .  Cholecalciferol (VITAMIN D) 2000 UNITS tablet, Take 2,000 Units by mouth daily., Disp: , Rfl:  .  Coenzyme Q10 (COQ10) 100 MG CAPS, Take 1 capsule by mouth daily., Disp: , Rfl:  .  docusate sodium (COLACE) 100 MG capsule, Take 100 mg by mouth daily., Disp: , Rfl:  .  fexofenadine (ALLEGRA) 180 MG tablet, Take 180 mg by mouth daily., Disp: , Rfl:  .  hydrochlorothiazide (HYDRODIURIL) 25 MG tablet, Take 1 tablet (25 mg total) by mouth daily., Disp:  30 tablet, Rfl: 12 .  ketoconazole (NIZORAL) 2 % cream, Apply 1 application topically daily., Disp: 50 g, Rfl: 0 .  ketoconazole (NIZORAL) 2 % cream, Apply 1 application topically 1 day or 1 dose., Disp: , Rfl:  .  lansoprazole (PREVACID) 15 MG capsule, Take 15 mg by mouth daily at 12 noon., Disp: , Rfl:  .  levothyroxine (SYNTHROID, LEVOTHROID) 75 MCG tablet, TAKE ONE TABLET BY MOUTH ONCE DAILY, Disp: 30 tablet, Rfl: 5 .  meclizine (ANTIVERT) 25 MG tablet, TAKE ONE TABLET BY MOUTH EVERY 4 TO 6 HOURS AS NEEDED, Disp: 30 tablet, Rfl: 3 .  Omega-3 Fatty Acids (FISH OIL) 1000 MG CAPS, Take 1 capsule by mouth once., Disp: , Rfl:  .  ondansetron (ZOFRAN) 4 MG tablet, Take 1 tablet (4 mg total) by mouth daily as needed  for nausea or vomiting., Disp: 30 tablet, Rfl: 1 .  ondansetron (ZOFRAN) 4 MG tablet, Take 1 tablet by mouth., Disp: , Rfl:  .  triamcinolone cream (KENALOG) 0.5 %, Apply 1 application topically 3 (three) times daily. Apply 1 application to ear 2-3 time daily, Disp: , Rfl:  .  Turmeric Curcumin 500 MG CAPS, Take 1 tablet by mouth 1 day or 1 dose., Disp: , Rfl:  Allergies  Allergen Reactions  . Penicillins Shortness Of Breath  . Pravastatin Sodium     Muscle pain, muscle cramps, muscle weakness     Review of Systems  Constitutional: Negative for fever, activity change, appetite change, fatigue and unexpected weight change.  HENT: Negative for congestion and sore throat.   Respiratory: Negative for cough, chest tightness, shortness of breath and wheezing.   Cardiovascular: Negative for chest pain and palpitations.  Gastrointestinal: Negative for nausea, vomiting, diarrhea, constipation and abdominal distention.  Genitourinary: Negative for dysuria, frequency and hematuria.  Musculoskeletal: Negative for back pain and joint swelling.  Skin: Negative for color change, pallor, rash and wound.  Hematological: Negative for adenopathy. Bruises/bleeds easily.  Psychiatric/Behavioral: Negative for confusion and agitation. The patient is nervous/anxious.   All other systems reviewed and are negative.  Filed Vitals:   10/01/15 0824  BP: 167/111  Pulse: 66  Temp: 98.1 F (36.7 C)      Objective:   Physical Exam  Constitutional: She is oriented to person, place, and time. She appears well-developed and well-nourished. No distress.  HENT:  Head: Normocephalic and atraumatic.  Right Ear: External ear normal.  Left Ear: External ear normal.  Nose: Nose normal.  Mouth/Throat: Oropharynx is clear and moist. No oropharyngeal exudate.  Eyes: Conjunctivae are normal. Pupils are equal, round, and reactive to light. No scleral icterus.  Neck: Normal range of motion. Neck supple. No tracheal  deviation present. No thyromegaly present.  Cardiovascular: Normal rate, regular rhythm, normal heart sounds and intact distal pulses.  Exam reveals no gallop and no friction rub.   No murmur heard. Pulmonary/Chest: Effort normal and breath sounds normal. No respiratory distress. She has no wheezes. She has no rales.  Abdominal: Soft. Bowel sounds are normal. She exhibits no distension. There is no tenderness.  Musculoskeletal: Normal range of motion. She exhibits no edema or tenderness.  Lymphadenopathy:    She has no cervical adenopathy.       Right cervical: No superficial cervical, no deep cervical and no posterior cervical adenopathy present.      Left cervical: No superficial cervical, no deep cervical and no posterior cervical adenopathy present.    She has no axillary adenopathy.  Right axillary: No pectoral and no lateral adenopathy present.       Left axillary: No pectoral and no lateral adenopathy present. Neurological: She is alert and oriented to person, place, and time. No cranial nerve deficit.  Skin: Skin is warm and dry. No rash noted. No erythema. No pallor.  Psychiatric: She has a normal mood and affect. Her behavior is normal. Judgment and thought content normal.  Vitals reviewed. Breast exam:  Right breast: bruising between 9-12oclock positions, palpable mass about 5cm x4cm in size, mobile, tender, no nipple discharge, retraction or puckering, no lymphadenopathy in cervical, supraclavicular or axilla Left breast: 2cm cyst palpable with some tenderness at 3 oclock position, soft, rubbery, mobile, no skin changes, retraction or nipple discharge, no cervical, supraclavicular or axillary lymphadenopathy      Assessment:     64 yr old female with right breast invasive lobular carcinoma    Plan:     I discussed extensively lobular carcinoma the treatment options, risks, benefits, alternatives and expected outcomes. I personally reviewed the pathology report, the images,  radiology report and spoke with our breast radiologist about her case. I explained to the patient that lobular carcinoma does not show as well on traditional imagining and that an MRI of both breast would be necessary to get an idea of the size of the lesion, as well as rule out any pathology in the contralateral breast.  I explained that it would depend on the size of lesion in comparison to her breast size if lumpectomy would be offered, verses neoadjuvant chemo prior to lumpectomy or mastectomy.  I discussed that the risks of recurrence usually are about equal for lumpectomy + radiation and mastectomy but that this all depends on multiple factors.  Patient has gone through a lot of anxiety from breast exams, mammograms and biopsies on both breast in the past.  She is leaning more toward bilateral mastectomy to avoid this anxiety in the future.  I will schedule her for Breast MRI ASAP and will have her f/u in clinic with those results so we can start operative planning.    I spent 60 minutes discussing the treatment, education and coordinating patient care.

## 2015-10-02 ENCOUNTER — Other Ambulatory Visit: Payer: Self-pay | Admitting: Surgery

## 2015-10-02 ENCOUNTER — Telehealth: Payer: Self-pay

## 2015-10-02 DIAGNOSIS — C801 Malignant (primary) neoplasm, unspecified: Secondary | ICD-10-CM

## 2015-10-02 NOTE — Telephone Encounter (Signed)
Patient called me back stating that she received a call from [facility] and her appointment for her Breast MRI will be on Tuesday 10/07/2015 at 1:30 PM. Patient also stated that she is claustrophobic so she was wanting a prescription for Valium to take when she had her appointment. She stated that she will come by the office tomorrow morning to pick-up her prescription and her CD to take to her appointment.  I told patient that I had discussed with Dr. Azalee Course if she needed to be seen by an Oncologist and she stated that she did. So, I told patient that since she will be needing to be seen by the Oncologist, that I will tell Angie to send that referral. I also told patient that the [facility] will be contacting her with an appointment to be seen by Dr. Grayland Ormond after we get results from her MRI. Patient understood.

## 2015-10-02 NOTE — Telephone Encounter (Signed)
Patient called asking if we had her Breast MRI scheduled for her. I told her that I had faxed her chart to [facility]" yesterday while she was been seen by Dr. Azalee Course. I told her that I would call [facility] to ask when they would contact her with an appointment for her breast MRI. Patient understood.  Called [facility] for patient's status for scheduling her breast MRI. I was told that patient will definitely be contacted by tomorrow.

## 2015-10-03 ENCOUNTER — Telehealth: Payer: Self-pay | Admitting: Surgery

## 2015-10-03 ENCOUNTER — Other Ambulatory Visit: Payer: Self-pay | Admitting: Surgery

## 2015-10-03 DIAGNOSIS — C50911 Malignant neoplasm of unspecified site of right female breast: Secondary | ICD-10-CM

## 2015-10-03 MED ORDER — DIAZEPAM 5 MG PO TABS: 5.0000 mg | ORAL_TABLET | ORAL | Status: DC

## 2015-10-03 NOTE — Telephone Encounter (Signed)
I have called Kaylee Reyes with Dr Maralyn Sago office--Patient's PCP. Patient will need a referral with [employer] to be seen with the [facility] with Dr Grayland Ormond. Pearl[place] advised me that she would do this referral online with [facility] today. I have put a referral in through [employer] to the [facility]. They will call patient with appointment date and time.

## 2015-10-07 ENCOUNTER — Ambulatory Visit
Admission: RE | Admit: 2015-10-07 | Discharge: 2015-10-07 | Disposition: A | Discharge status: Home / Self Care | Payer: 59 | Source: Ambulatory Visit | Attending: Surgery | Admitting: Surgery

## 2015-10-07 ENCOUNTER — Encounter: Payer: Self-pay | Admitting: Family Medicine

## 2015-10-07 DIAGNOSIS — C801 Malignant (primary) neoplasm, unspecified: Secondary | ICD-10-CM

## 2015-10-07 MED ORDER — GADOBENATE DIMEGLUMINE 529 MG/ML IV SOLN
20.0000 mL | Freq: Once | INTRAVENOUS | Status: AC | PRN
  Administered 2015-10-07: 20 mL via INTRAVENOUS

## 2015-10-09 ENCOUNTER — Telehealth: Payer: Self-pay

## 2015-10-09 NOTE — Telephone Encounter (Signed)
Phoned patient and introduced navigation service.  Per her request Fed-Exed Breast Cancer Treatment Handbook/folder with hospital services.  Will meet her at initial Medical Oncology visit with Dr. Grayland Ormond on 10/14/15 at 10:00.  Notified patient that MRI report not available at this time.   Oncology Nurse Navigator Documentation    Navigator Encounter Type: Introductory phone call (10/09/15 1600) Patient Visit Type: Initial (10/09/15 1600) Treatment Phase: Treatment (10/09/15 1600) Barriers/Navigation Needs: Financial;Education (10/09/15 1600) Education: Accessing Care/ Finding Providers;Understanding Cancer/ Treatment Options;Coping with Diagnosis/ Prognosis;Newly Diagnosed Cancer Education (10/09/15 1600) Interventions: Coordination of Care (10/09/15 1600)   Coordination of Care: MD Appointments (10/09/15 1600)        Time Spent with Patient: 60 (10/09/15 1600)

## 2015-10-13 ENCOUNTER — Telehealth: Payer: Self-pay

## 2015-10-13 NOTE — Telephone Encounter (Signed)
[facility] to ask for patient's preliminary report and I was told that because they are needing patient's mammogram images. I called [employer] and asked if they had already mailed [facility] imaging patient's images and they stated that they had mailed it on 10/10/2015.

## 2015-10-14 ENCOUNTER — Encounter: Payer: Self-pay | Admitting: Family Medicine

## 2015-10-14 ENCOUNTER — Inpatient Hospital Stay: Payer: 59 | Attending: Oncology | Admitting: Oncology

## 2015-10-14 VITALS — BP 140/79 | HR 69 | Temp 96.3°F | Resp 18 | Ht 66.14 in | Wt 295.2 lb

## 2015-10-14 DIAGNOSIS — I10 Essential (primary) hypertension: Secondary | ICD-10-CM | POA: Insufficient documentation

## 2015-10-14 DIAGNOSIS — C50911 Malignant neoplasm of unspecified site of right female breast: Secondary | ICD-10-CM | POA: Insufficient documentation

## 2015-10-14 DIAGNOSIS — Z79899 Other long term (current) drug therapy: Secondary | ICD-10-CM

## 2015-10-14 DIAGNOSIS — Z170 Estrogen receptor positive status [ER+]: Secondary | ICD-10-CM | POA: Insufficient documentation

## 2015-10-14 DIAGNOSIS — E785 Hyperlipidemia, unspecified: Secondary | ICD-10-CM | POA: Diagnosis not present

## 2015-10-14 DIAGNOSIS — E079 Disorder of thyroid, unspecified: Secondary | ICD-10-CM | POA: Insufficient documentation

## 2015-10-14 DIAGNOSIS — K219 Gastro-esophageal reflux disease without esophagitis: Secondary | ICD-10-CM | POA: Insufficient documentation

## 2015-10-15 ENCOUNTER — Ambulatory Visit (INDEPENDENT_AMBULATORY_CARE_PROVIDER_SITE_OTHER): Payer: 59 | Admitting: Surgery

## 2015-10-15 ENCOUNTER — Encounter: Payer: Self-pay | Admitting: Surgery

## 2015-10-15 VITALS — BP 147/82 | HR 66 | Temp 98.0°F | Ht 66.0 in | Wt 295.0 lb

## 2015-10-15 DIAGNOSIS — C50911 Malignant neoplasm of unspecified site of right female breast: Secondary | ICD-10-CM | POA: Diagnosis not present

## 2015-10-15 DIAGNOSIS — N63 Unspecified lump in breast: Secondary | ICD-10-CM

## 2015-10-15 DIAGNOSIS — N6320 Unspecified lump in the left breast, unspecified quadrant: Secondary | ICD-10-CM | POA: Insufficient documentation

## 2015-10-15 NOTE — Patient Instructions (Signed)
You will be receiving a call from our surgery scheduler detailing your pre operative appointment and procedure date and time. If you have any questions, please give our office a call.

## 2015-10-15 NOTE — Progress Notes (Signed)
Subjective:     Patient ID: Kaylee Reyes, female   DOB: 10-Feb-1951, 64 y.o.   MRN: [id number]  HPI  64 yr old with Right 5cm lobular breast cancer.  She came in today to discuss results of MRI and planning for surgery.  Patient states she's doing ok but has had some anxiety.  She states its very frustrating to wait for results.   Review of Systems  Constitutional: Negative for fever, appetite change and unexpected weight change.  HENT: Negative for congestion.   Respiratory: Negative for shortness of breath and wheezing.   Cardiovascular: Negative for chest pain, palpitations and leg swelling.  Gastrointestinal: Negative for abdominal pain and abdominal distention.  Skin: Negative for color change, pallor, rash and wound.  Psychiatric/Behavioral: The patient is nervous/anxious.   All other systems reviewed and are negative.  Filed Vitals:   10/15/15 0849  BP: 147/82  Pulse: 66  Temp: 98 F (36.7 C)      Objective:   Physical Exam  Constitutional: She is oriented to person, place, and time. She appears well-developed and well-nourished. No distress.  HENT:  Head: Normocephalic and atraumatic.  Right Ear: External ear normal.  Left Ear: External ear normal.  Nose: Nose normal.  Mouth/Throat: Oropharynx is clear and moist. No oropharyngeal exudate.  Eyes: Conjunctivae are normal. Pupils are equal, round, and reactive to light. No scleral icterus.  Neck: Normal range of motion. Neck supple. No tracheal deviation present.  Cardiovascular: Normal rate, regular rhythm and normal heart sounds.   Pulmonary/Chest: Effort normal and breath sounds normal. No respiratory distress.  Abdominal: Soft. Bowel sounds are normal.  Musculoskeletal: Normal range of motion. She exhibits no edema or tenderness.  Neurological: She is alert and oriented to person, place, and time.  Skin: Skin is warm and dry. No rash noted. No erythema. No pallor.  Psychiatric: She has a normal mood and affect. Her  behavior is normal. Judgment and thought content normal.  Vitals reviewed.      Assessment:     64 yr old with Right 5cm lobular breast cancer and suspicious mass on left breast    Plan:     I had an extensive discussion with the patient and her friend concerning the MRI results.  Showing a large 4.8cm mass in right breast, another smaller mass in right and then 2 suspicious areas in left breast 49mm and 2.5cm as well.  Patient had previously been leaning toward a bilateral mastectomy as well.  I went over the options again with patient which given the size would including right mastectomy with SLN and possible lumpectomy after biopsy or bilateral mastectomy with bilateral SLN.  Patient confirmed that she wanted to proceed with bilateral mastectomy with bilateral SLN biopsy.  I discussed the risks, benefits, alternatives and expected outcomes with the patient including bleeding, infection, hematoma and seroma formation, need for possible drainage procedure, leaving drains at time of procedure, damage to surrounding structures, including nerve damage causing numbness to skin, arm, difficulty raising or lowering arm, and swelling or lymphedema in the arm.  Explained that she would be admitted overnight for observation and pain control, with a special bra to help with compression and soreness.  Patient was given the opportunity to ask questions and have them answered and agrees to bilateral mastectomy with sentinel lymph node biopsies.    We also discussed her getting a PET scan and let her know that the results of that would not change the main operation, but  that if it did show an area and if Dr. Grayland Ormond felt she would need radiation she would need a port placement.  If these results show before her mastectomy can perform at time of or after if lymph nodes are positive.   I discussed the risks, benefits, alternatives and expected outcomes with the patient including bleeding, infection, pneumothorax,  possible need for chest tube placement, possible need for hospital observation, possible need for removal of port if not working properly or in the case of infection.  Patient understands and agrees to have port placement if needed.    I spent over 60 minutes with the patient discussing results, education, counseling and coordinating patient care.

## 2015-10-16 ENCOUNTER — Other Ambulatory Visit: Payer: Self-pay | Admitting: *Deleted

## 2015-10-16 MED ORDER — ATENOLOL 50 MG PO TABS: 50.0000 mg | ORAL_TABLET | Freq: Every day | ORAL | Status: DC

## 2015-10-16 NOTE — Addendum Note (Signed)
Addended by: Hubbard Robinson on: 10/16/2015 02:58 PM   Modules accepted: Orders

## 2015-10-16 NOTE — Telephone Encounter (Signed)
Requesting 90 day supply.

## 2015-10-17 ENCOUNTER — Telehealth: Payer: Self-pay | Admitting: Surgery

## 2015-10-17 NOTE — Telephone Encounter (Signed)
Angie, please call patient to discuss scheduling her mastectomy. Thank you.

## 2015-10-17 NOTE — Progress Notes (Signed)
Met patient, and friend at initial Oncology visit with Dr. Grayland Ormond on 10/14/15.  PET scheduled 10/21/15. She saw Dr. Azalee Course following day, and bilateral mastectomy is planned for 10/28/15.  She had questions about diet.  Given book on diet for cancer patients, and information on diet for breast cancer patient from [email or web address].  Faxed approval form to [facility] to pay for camisoles for patient to wear post-op.    Oncology Nurse Navigator Documentation  Referral date to RadOnc/MedOnc: 10/14/15 (10/17/15 1500) Navigator Encounter Type: Initial MedOnc;Clinic/MDC;Telephone;Treatment (10/17/15 1500) Patient Visit Type: Medonc;Surgery (10/17/15 1500) Treatment Phase: Treatment (10/17/15 1500) Barriers/Navigation Needs: Financial;Education (10/17/15 1500) Education: Understanding Cancer/ Treatment Options;Coping with Diagnosis/ Prognosis;Newly Diagnosed Cancer Education;lymphedema class;Preparing for Upcoming Surgery/ Treatment (10/17/15 1500) Interventions: Coordination of Care;Education Method (10/17/15 1500)   Coordination of Care: MD Appointments;Radiology (10/17/15 1500) Education Method: Written;Verbal;Teach-back (10/17/15 1500)    Specialty Items/DME:  (camisoles) (10/17/15 1500) Time Spent with Patient: > 120 (10/17/15 1500)

## 2015-10-20 ENCOUNTER — Telehealth: Payer: Self-pay | Admitting: Surgery

## 2015-10-20 NOTE — Telephone Encounter (Signed)
Awaiting response from Dr. Azalee Course at this time. Will call patient back as soon as this decision has been made.

## 2015-10-20 NOTE — Telephone Encounter (Signed)
Pt advised of pre op date/time and sx date. Sx: 10/28/15 with Dr Lenor Derrick Mastectomy with bilateral sentinel lymph node biopsy with a possible port placement. Pre op: 10/23/15 @ 9:15 am-office.    Patient was advised to arrive at 10:15 the day of surgery at Radiology for the Sentinel Node Injection.    Patient has questions about being prescribed her pain medication prior to surgery so that she can have these available when sent home from hospital. Please contact patient to discuss this.

## 2015-10-20 NOTE — Telephone Encounter (Signed)
Patient is having surgery next week with Dr. Azalee Course, she would like to know if she can get a prescription for her pain medication before the surgery. Please call and advise

## 2015-10-20 NOTE — Telephone Encounter (Signed)
Returned patient call. Informed patient that she will be given a prescription for pain medication upon discharge. Patient confirmed understanding of information.

## 2015-10-20 NOTE — Telephone Encounter (Signed)
Spoke with Dr. Azalee Course at this time. She states that patient will be admitted 1-2 nights following surgery and her plan is to write script and give to family the day of patient's surgery so that they may have this filled while she is in the hospital.

## 2015-10-22 NOTE — Progress Notes (Signed)
[place]  [phone] [phone]) [phone]  ID: Kaylee Reyes OB: 02/27/1951  MR#: [id number]  [id number]  Patient Care Team: Birdie Sons, MD as PCP - General (Family Medicine) [name], MD as Consulting Physician (Gastroenterology)  CHIEF COMPLAINT:  Chief Complaint  Patient presents with  . New Evaluation    breast cancer    INTERVAL HISTORY: Patient is a 64 year old female who was noted to have an abnormal mammogram on routine screening. Subsequent biopsy revealed invasive lobular carcinoma. She has been referred to clinic for evaluation and discussion of treatment options. She currently is anxious, but otherwise feels well. She has no neurologic complaints. She denies any recent fevers or illnesses. She denies any pain. She has a good appetite and denies weight loss. She denies any chest pain or shortness of breath. She has no nausea, vomiting, constipation, or diarrhea. She has no urinary complaints. Patient otherwise feels well and offers no further specific complaints.  REVIEW OF SYSTEMS:   Review of Systems  Constitutional: Negative for fever and malaise/fatigue.  Cardiovascular: Negative.   Gastrointestinal: Negative.   Musculoskeletal: Negative.   Neurological: Negative.  Negative for weakness.  Psychiatric/Behavioral: The patient is nervous/anxious.     As per HPI. Otherwise, a complete review of systems is negatve.  PAST MEDICAL HISTORY: Past Medical History  Diagnosis Date  . Hypertension   . GERD (gastroesophageal reflux disease)   . Thyroid disease   . Hyperlipidemia   . History of chicken pox     PAST SURGICAL HISTORY: Past Surgical History  Procedure Laterality Date  . Cholecystectomy  1990  . Wrist surgery    . Cesarean section  1988  . Exploratory laparotomy      Left breast  . Abdominal hysterectomy  07/13/2013    Total. with BSO for postmenopausal bleeding and fibroids with large cervical polyp. Dr.  Levy Sjogren and Dr. Glennon Mac at [facility]  . Breast surgery  2011    BREAST BIOPSY  . Abdominal ultrasound  04/17/2011    Hepatomegaly with borderline slenomegaly. Suggestive of fatty liver. s/p cholecystectomy. Portions of aorta obscured    FAMILY HISTORY Family History  Problem Relation Age of Onset  . Hypertension Mother   . Thyroid disease Mother   . Hypertension Brother   . Diabetes Brother        ADVANCED DIRECTIVES:    HEALTH MAINTENANCE: Social History  Substance Use Topics  . Smoking status: Never Smoker   . Smokeless tobacco: Never Used  . Alcohol Use: No     Colonoscopy:  PAP:  Bone density:  Lipid panel:  Allergies  Allergen Reactions  . Penicillins Shortness Of Breath  . Nickel Hives  . Pravastatin Sodium     Muscle pain, muscle cramps, muscle weakness    Current Outpatient Prescriptions  Medication Sig Dispense Refill  . Alum & Mag Hydroxide-Simeth (MAGIC MOUTHWASH) SOLN RINSE AND SPIT ONE TEASPOONFUL BY MOUTH EVERY 3 TO 4 HOURS AS NEEDED 100 mL 1  . b complex vitamins tablet Take 1 tablet by mouth daily.    . Cholecalciferol (VITAMIN D) 2000 UNITS tablet Take 2,000 Units by mouth daily.    Marland Kitchen docusate sodium (COLACE) 100 MG capsule Take 100 mg by mouth daily.    . fexofenadine (ALLEGRA) 180 MG tablet Take 180 mg by mouth daily.    . hydrochlorothiazide (HYDRODIURIL) 25 MG tablet Take 1 tablet (25 mg total) by mouth daily. 30 tablet 12  . ketoconazole (NIZORAL) 2 % cream  Apply 1 application topically daily. 50 g 0  . lansoprazole (PREVACID) 15 MG capsule Take 15 mg by mouth daily at 12 noon.    Marland Kitchen levothyroxine (SYNTHROID, LEVOTHROID) 75 MCG tablet TAKE ONE TABLET BY MOUTH ONCE DAILY 30 tablet 5  . meclizine (ANTIVERT) 25 MG tablet TAKE ONE TABLET BY MOUTH EVERY 4 TO 6 HOURS AS NEEDED 30 tablet 3  . Omega-3 Fatty Acids (FISH OIL) 1000 MG CAPS Take 1 capsule by mouth once.    . Turmeric Curcumin 500 MG CAPS Take 1 tablet by mouth 1 day or 1 dose.    Marland Kitchen atenolol  (TENORMIN) 50 MG tablet Take 1 tablet (50 mg total) by mouth daily. 90 tablet 4  . hydrochlorothiazide (HYDRODIURIL) 25 MG tablet Take 1 tablet by mouth daily.     No current facility-administered medications for this visit.    OBJECTIVE: Filed Vitals:   10/14/15 1058  BP: 140/79  Pulse: 69  Temp: 96.3 F (35.7 C)  Resp: 18     Body mass index is 47.44 kg/(m^2).    ECOG FS:0 - Asymptomatic  General: Well-developed, well-nourished, no acute distress. Eyes: Pink conjunctiva, anicteric sclera. HEENT: Normocephalic, moist mucous membranes, clear oropharnyx. Breasts: Patient requested exam be deferred today. Lungs: Clear to auscultation bilaterally. Heart: Regular rate and rhythm. No rubs, murmurs, or gallops. Abdomen: Soft, nontender, nondistended. No organomegaly noted, normoactive bowel sounds. Musculoskeletal: No edema, cyanosis, or clubbing. Neuro: Alert, answering all questions appropriately. Cranial nerves grossly intact. Skin: No rashes or petechiae noted. Psych: Normal affect. Lymphatics: No cervical, calvicular, axillary or inguinal LAD.   LAB RESULTS:  Lab Results  Component Value Date   NA 146 01/22/2015   K 4.4 01/22/2015   CL 102 07/15/2013   CO2 27 07/15/2013   GLUCOSE 112* 07/15/2013   BUN 14 01/22/2015   CREATININE 0.8 01/22/2015   CALCIUM 8.2* 07/15/2013   AST 20 01/22/2015   ALT 25 01/22/2015   GFRNONAA >60 07/15/2013   GFRAA >60 07/15/2013    Lab Results  Component Value Date   WBC 12.1* 07/15/2013   NEUTROABS 9.7* 07/15/2013   HGB 8.8* 07/15/2013   HCT 25.6* 07/15/2013   MCV 85 07/15/2013   PLT 191 07/15/2013     STUDIES: Mr Breast Bilateral W Wo Contrast  10/13/2015  CLINICAL DATA:  64 year old female initially presenting for a palpable area of concern in the right breast. Mammogram demonstrated a spiculated mass in the right breast, which was subsequently biopsied and demonstrates invasive lobular carcinoma. MRI evaluation for extent of  disease. LABS:  Creatinine of 0.8 milligrams/deciliter on 01/22/2015. EXAM: BILATERAL BREAST MRI WITH AND WITHOUT CONTRAST TECHNIQUE: Multiplanar, multisequence MR images of both breasts were obtained prior to and following the intravenous administration of 20 ml of MultiHance. THREE-DIMENSIONAL MR IMAGE RENDERING ON INDEPENDENT WORKSTATION: Three-dimensional MR images were rendered by post-processing of the original MR data on an independent workstation. The three-dimensional MR images were interpreted, and findings are reported in the following complete MRI report for this study. Three dimensional images were evaluated at the independent DynaCad workstation COMPARISON:  Previous exam(s). FINDINGS: Breast composition: c. Heterogeneous fibroglandular tissue. Background parenchymal enhancement: Mild. Right breast: In the superior right breast, there is a large spiculated mass with washout kinetics measuring 4.8 x 3.7 x 2.8 cm. Clumped non mass enhancement extends slightly inferior to this dominant mass. Both in the lower outer and lower inner quadrants, there are scattered small enhancing foci in masses, some of which demonstrate washout  kinetics. For example, there is a 4 mm enhancing mass in the lower outer quadrant, posterior depth in series 8, image 95, which lies approximately 3.5 cm inferior and posterior to the dominant mass. Additionally, there is a 4 mm enhancing mass in the central right breast in series 8, image 102, anterior depth with washout kinetics which lies approximately 2.0 cm inferior to the inferior margin of the dominant mass. Just medial to this, is another 4 mm enhancing mass with borderline washout kinetics an series 8, image 97. Left breast: In the central left breast, middle depth there is an enhancing 8 mm oval mass with washout kinetics (series 8, image 95). Lateral to this in the middle depth, there is a 2.5 cm area of clumped non mass enhancement with foci of washout kinetics (series 8,  image 96). There are multiple scattered foci of enhancement elsewhere in the left breast, though less suspicious than the small enhancing masses just described. Lymph nodes: No abnormal appearing lymph nodes. Ancillary findings:  None. IMPRESSION: 1. The biopsy proven malignancy in the superior right breast measures up to 4.8 cm. 2. Scattered small enhancing masses are seen in the lower outer and inter quadrants of the right breast suspicious for satellite lesions. A 4 mm enhancing mass in the lower outer quadrant is the farthest from the dominant mass lying approximately 3.5 cm inferior and posterior. 3. There is a suspicious 8 mm mass in the central left breast. 4. There is a suspicious 2.5 cm area of clumped non mass enhancement in the lateral left breast. RECOMMENDATION: 1. If breast conservation is desired for the right breast, a MRI guided biopsy is recommended for the 4 mm enhancing mass in the lower outer quadrant, posterior depth. 2. MRI guided biopsy is recommended for the 8 mm oval mass in the central left breast 3. MRI biopsy is recommended for the 2.5 cm area of clumped non mass enhancement in the lateral left breast. BI-RADS CATEGORY  BI-RADS 4.  Suspicious. Electronically Signed   By: Ammie Ferrier M.D.   On: 10/13/2015 15:36    ASSESSMENT: At least stage IIB ER/PR positive invasive lobular carcinoma of the right breast.  PLAN:    1. Breast cancer: MRI results reviewed independently and reported as above revealing the known mass in the right breast as well as 2 suspicious areas in the left breast. Patient previously evaluated by surgery and is contemplating bilateral mastectomy. We had a lengthy discussion regarding the pros and cons of mastectomy versus lumpectomy. We also discussed the fact that her breast cancer is lobular and not very responsive to chemotherapy. She would definitely benefit from an aromatase inhibitor and possibly XRT depending on the final pathology of her surgery.  Because of the metastatic potential of lobular cancer and the fact that she may have suspicious lesions in her left breast, we will get a PET scan to further evaluate and assess for systemic disease. She has been instructed to keep her follow-up appointment with surgery in the next week. Return to clinic several days after her PET scan for further evaluation.  Approximately 45 minutes was spent in discussion of which greater than 50% was consultation.   Patient expressed understanding and was in agreement with this plan. She also understands that She can call clinic at any time with any questions, concerns, or complaints.   Invasive lobular carcinoma of right breast, stage 2 (HCC)   Staging form: Breast, AJCC 7th Edition     Clinical stage from  10/22/2015: Stage IIB (T3, N0, M0) - Signed by Lloyd Huger, MD on 10/22/2015   Lloyd Huger, MD   10/22/2015 9:17 AM

## 2015-10-23 ENCOUNTER — Encounter
Admission: RE | Admit: 2015-10-23 | Discharge: 2015-10-23 | Disposition: A | Discharge status: Home / Self Care | Payer: 59 | Source: Ambulatory Visit | Attending: Oncology | Admitting: Oncology

## 2015-10-23 ENCOUNTER — Encounter
Admission: RE | Admit: 2015-10-23 | Discharge: 2015-10-23 | Disposition: A | Discharge status: Home / Self Care | Payer: 59 | Source: Ambulatory Visit | Attending: Surgery | Admitting: Surgery

## 2015-10-23 DIAGNOSIS — Z01812 Encounter for preprocedural laboratory examination: Secondary | ICD-10-CM | POA: Insufficient documentation

## 2015-10-23 DIAGNOSIS — Z01810 Encounter for preprocedural cardiovascular examination: Secondary | ICD-10-CM | POA: Insufficient documentation

## 2015-10-23 DIAGNOSIS — C50911 Malignant neoplasm of unspecified site of right female breast: Secondary | ICD-10-CM

## 2015-10-23 HISTORY — DX: Hypothyroidism, unspecified: E03.9

## 2015-10-23 LAB — CBC
HEMATOCRIT: 36.5 % (ref 35.0–47.0)
HEMOGLOBIN: 12 g/dL (ref 12.0–16.0)
MCH: 27.8 pg (ref 26.0–34.0)
MCHC: 33 g/dL (ref 32.0–36.0)
MCV: 84.2 fL (ref 80.0–100.0)
Platelets: 195 10*3/uL (ref 150–440)
RBC: 4.33 MIL/uL (ref 3.80–5.20)
RDW: 14 % (ref 11.5–14.5)
WBC: 7.2 10*3/uL (ref 3.6–11.0)

## 2015-10-23 LAB — BASIC METABOLIC PANEL
ANION GAP: 9 (ref 5–15)
BUN: 14 mg/dL (ref 6–20)
CALCIUM: 9.4 mg/dL (ref 8.9–10.3)
CO2: 28 mmol/L (ref 22–32)
Chloride: 102 mmol/L (ref 101–111)
Creatinine, Ser: 0.8 mg/dL (ref 0.44–1.00)
GFR calc Af Amer: >60 mL/min (ref >60)
GFR calc non Af Amer: >60 mL/min (ref >60)
GLUCOSE: 101 mg/dL — AB (ref 65–99)
POTASSIUM: 4 mmol/L (ref 3.5–5.1)
Sodium: 139 mmol/L (ref 135–145)

## 2015-10-23 LAB — TYPE AND SCREEN
ABO/RH(D): A POS
Antibody Screen: NEGATIVE

## 2015-10-23 LAB — ABO/RH: ABO/RH(D): A POS

## 2015-10-23 LAB — GLUCOSE, CAPILLARY: Glucose-Capillary: 84 mg/dL (ref 65–99)

## 2015-10-23 MED ORDER — FLUDEOXYGLUCOSE F - 18 (FDG) INJECTION
12.2300 | Freq: Once | INTRAVENOUS | Status: DC | PRN
  Administered 2015-10-23: 12.23 via INTRAVENOUS
  Filled 2015-10-23: qty 12.23

## 2015-10-23 NOTE — Patient Instructions (Signed)
  Your procedure is scheduled [id number] 10/28/2015 Report to Radiology. [facility] To find out your arrival time please call [phone] between 1PM - 3PM on .  Remember: Instructions that are not followed completely may result in serious medical risk, up to and including death, or upon the discretion of your surgeon and anesthesiologist your surgery may need to be rescheduled.    __X__ 1. Do not eat food or drink liquids after midnight. No gum chewing or hard candies.     __X__ 2. No Alcohol for 24 hours before or after surgery.   ____ 3. Bring all medications with you on the day of surgery if instructed.    __X__ 4. Notify your doctor if there is any change in your medical condition     (cold, fever, infections).     Do not wear jewelry, make-up, hairpins, clips or nail polish.  Do not wear lotions, powders, or perfumes.   Do not shave 48 hours prior to surgery. Men may shave face and neck.  Do not bring valuables to the hospital.    [facility] is not responsible for any belongings or valuables.               Contacts, dentures or bridgework may not be worn into surgery.  Leave your suitcase in the car. After surgery it may be brought to your room.  For patients admitted to the hospital, discharge time is determined by your                treatment team.   Patients discharged the day of surgery will not be allowed to drive home.   Please read over the following fact sheets that you were given:   Surgical Site Infection Prevention   __X__ Take these medicines the morning of surgery with A SIP OF WATER:    1. ATENOLOL  2. PREVACID  3. LEVOTHYROXINE  4.  5.  6.  ____ Fleet Enema (as directed)   __X__ Use CHG Soap as directed  ____ Use inhalers on the day of surgery  ____ Stop metformin 2 days prior to surgery    ____ Take 1/2 of usual insulin dose the night before surgery and none on the morning of surgery.   ____ Stop Coumadin/Plavix/aspirin on    ____ Stop Anti-inflammatories on    __X__ Stop supplements until after surgery.    ____ Bring C-Pap to the hospital.

## 2015-10-24 ENCOUNTER — Telehealth: Payer: Self-pay | Admitting: Oncology

## 2015-10-24 NOTE — Telephone Encounter (Signed)
PET looks great. No metastatic disease.  Webb Silversmith has been in close contact with her these past couple weeks.

## 2015-10-24 NOTE — Telephone Encounter (Signed)
Forwarded to MD.

## 2015-10-24 NOTE — Telephone Encounter (Signed)
Patient would like a call back regarding her PET results. Said she has surgery on Tuesday. Please call: [phone]. Thanks.

## 2015-10-27 ENCOUNTER — Ambulatory Visit: Payer: 59

## 2015-10-27 ENCOUNTER — Encounter: Payer: Self-pay | Admitting: Oncology

## 2015-10-27 ENCOUNTER — Telehealth: Payer: Self-pay

## 2015-10-27 NOTE — Telephone Encounter (Signed)
Call placed to patient to discuss PET results, patient scheduled for surgery tomorrow. Patient will be set up with appointment in approximately 2 weeks to see Dr. Grayland Ormond and Dr. Baruch Gouty.

## 2015-10-27 NOTE — Telephone Encounter (Signed)
Received call from [place] (OR scheduler) that [facility] was not scheduled. Informed her that patient has been scheduled and is in computer to arrive at 10:15am for injection at 10:30am at [facility]. She states that radiology knows nothing about this.  Spoke with Abby in radiology who informs me that this has been scheduled by [facility] scheduling at [facility] and she will have them move to [facility]. No problem and everything is on same schedule.  Called Leah back and informed her of this. Everything is in line for surgery at this time.

## 2015-10-28 ENCOUNTER — Encounter
Admission: RE | Disposition: A | Discharge status: Home / Self Care | Payer: Self-pay | Source: Ambulatory Visit | Attending: Surgery

## 2015-10-28 ENCOUNTER — Encounter (HOSPITAL_COMMUNITY): Payer: 59

## 2015-10-28 ENCOUNTER — Ambulatory Visit
Admission: RE | Admit: 2015-10-28 | Discharge: 2015-10-28 | Disposition: A | Discharge status: Home / Self Care | Payer: 59 | Source: Ambulatory Visit | Attending: Surgery | Admitting: Surgery

## 2015-10-28 ENCOUNTER — Encounter: Payer: Self-pay | Admitting: *Deleted

## 2015-10-28 ENCOUNTER — Inpatient Hospital Stay: Payer: 59 | Admitting: Certified Registered Nurse Anesthetist

## 2015-10-28 ENCOUNTER — Observation Stay
Admission: RE | Admit: 2015-10-28 | Discharge: 2015-10-30 | Disposition: A | Discharge status: Home / Self Care | Payer: 59 | Source: Ambulatory Visit | Attending: Surgery | Admitting: Surgery

## 2015-10-28 DIAGNOSIS — Z833 Family history of diabetes mellitus: Secondary | ICD-10-CM | POA: Insufficient documentation

## 2015-10-28 DIAGNOSIS — Z6835 Body mass index (BMI) 35.0-35.9, adult: Secondary | ICD-10-CM | POA: Diagnosis present

## 2015-10-28 DIAGNOSIS — Z8349 Family history of other endocrine, nutritional and metabolic diseases: Secondary | ICD-10-CM | POA: Insufficient documentation

## 2015-10-28 DIAGNOSIS — E66813 Obesity, class 3: Secondary | ICD-10-CM | POA: Diagnosis present

## 2015-10-28 DIAGNOSIS — Z9049 Acquired absence of other specified parts of digestive tract: Secondary | ICD-10-CM | POA: Diagnosis not present

## 2015-10-28 DIAGNOSIS — Z888 Allergy status to other drugs, medicaments and biological substances status: Secondary | ICD-10-CM | POA: Insufficient documentation

## 2015-10-28 DIAGNOSIS — I10 Essential (primary) hypertension: Secondary | ICD-10-CM | POA: Diagnosis present

## 2015-10-28 DIAGNOSIS — Z90710 Acquired absence of both cervix and uterus: Secondary | ICD-10-CM | POA: Diagnosis not present

## 2015-10-28 DIAGNOSIS — Z8249 Family history of ischemic heart disease and other diseases of the circulatory system: Secondary | ICD-10-CM | POA: Insufficient documentation

## 2015-10-28 DIAGNOSIS — K219 Gastro-esophageal reflux disease without esophagitis: Secondary | ICD-10-CM | POA: Diagnosis not present

## 2015-10-28 DIAGNOSIS — D0511 Intraductal carcinoma in situ of right breast: Principal | ICD-10-CM | POA: Insufficient documentation

## 2015-10-28 DIAGNOSIS — N63 Unspecified lump in breast: Secondary | ICD-10-CM | POA: Diagnosis present

## 2015-10-28 DIAGNOSIS — C50911 Malignant neoplasm of unspecified site of right female breast: Secondary | ICD-10-CM | POA: Diagnosis present

## 2015-10-28 DIAGNOSIS — E785 Hyperlipidemia, unspecified: Secondary | ICD-10-CM | POA: Insufficient documentation

## 2015-10-28 DIAGNOSIS — C773 Secondary and unspecified malignant neoplasm of axilla and upper limb lymph nodes: Secondary | ICD-10-CM | POA: Insufficient documentation

## 2015-10-28 DIAGNOSIS — E079 Disorder of thyroid, unspecified: Secondary | ICD-10-CM | POA: Diagnosis not present

## 2015-10-28 DIAGNOSIS — N6320 Unspecified lump in the left breast, unspecified quadrant: Secondary | ICD-10-CM

## 2015-10-28 DIAGNOSIS — D242 Benign neoplasm of left breast: Secondary | ICD-10-CM | POA: Insufficient documentation

## 2015-10-28 DIAGNOSIS — Z880 Allergy status to penicillin: Secondary | ICD-10-CM | POA: Insufficient documentation

## 2015-10-28 DIAGNOSIS — Z79899 Other long term (current) drug therapy: Secondary | ICD-10-CM | POA: Insufficient documentation

## 2015-10-28 DIAGNOSIS — Z9013 Acquired absence of bilateral breasts and nipples: Secondary | ICD-10-CM

## 2015-10-28 DIAGNOSIS — E669 Obesity, unspecified: Secondary | ICD-10-CM | POA: Diagnosis present

## 2015-10-28 DIAGNOSIS — Z6822 Body mass index (BMI) 22.0-22.9, adult: Secondary | ICD-10-CM | POA: Diagnosis not present

## 2015-10-28 DIAGNOSIS — Z170 Estrogen receptor positive status [ER+]: Secondary | ICD-10-CM | POA: Insufficient documentation

## 2015-10-28 HISTORY — DX: Malignant (primary) neoplasm, unspecified: C80.1

## 2015-10-28 HISTORY — PX: MASTECTOMY W/ SENTINEL NODE BIOPSY: SHX2001

## 2015-10-28 LAB — GLUCOSE, CAPILLARY: Glucose-Capillary: 116 mg/dL — ABNORMAL HIGH (ref 65–99)

## 2015-10-28 SURGERY — MASTECTOMY WITH SENTINEL LYMPH NODE BIOPSY
Anesthesia: General | Laterality: Bilateral | Wound class: Clean

## 2015-10-28 MED ORDER — ACETAMINOPHEN 10 MG/ML IV SOLN
INTRAVENOUS | Status: DC | PRN
  Administered 2015-10-28: 1000 mg via INTRAVENOUS

## 2015-10-28 MED ORDER — DIPHENHYDRAMINE HCL 12.5 MG/5ML PO ELIX: 12.5000 mg | ORAL_SOLUTION | Freq: Four times a day (QID) | ORAL | Status: DC | PRN

## 2015-10-28 MED ORDER — LACTATED RINGERS IV SOLN
INTRAVENOUS | Status: DC
  Administered 2015-10-28: 1 mL via INTRAVENOUS
  Administered 2015-10-29 (×2): via INTRAVENOUS

## 2015-10-28 MED ORDER — CYCLOBENZAPRINE HCL 5 MG PO TABS
7.5000 mg | ORAL_TABLET | Freq: Three times a day (TID) | ORAL | Status: DC
  Administered 2015-10-29 – 2015-10-30 (×5): 7.5 mg via ORAL
  Filled 2015-10-28 (×8): qty 1.5

## 2015-10-28 MED ORDER — PROPOFOL 10 MG/ML IV BOLUS
INTRAVENOUS | Status: DC | PRN
  Administered 2015-10-28: 200 mg via INTRAVENOUS

## 2015-10-28 MED ORDER — HYDRALAZINE HCL 20 MG/ML IJ SOLN: 10.0000 mg | INTRAMUSCULAR | Status: DC | PRN

## 2015-10-28 MED ORDER — MIDAZOLAM HCL 2 MG/2ML IJ SOLN
INTRAMUSCULAR | Status: DC | PRN
  Administered 2015-10-28: 2 mg via INTRAVENOUS

## 2015-10-28 MED ORDER — CEFAZOLIN SODIUM 10 G IJ SOLR
3.0000 g | INTRAMUSCULAR | Status: DC
  Filled 2015-10-28: qty 3000

## 2015-10-28 MED ORDER — PANTOPRAZOLE SODIUM 40 MG PO TBEC
40.0000 mg | DELAYED_RELEASE_TABLET | Freq: Every day | ORAL | Status: DC
  Administered 2015-10-29 – 2015-10-30 (×2): 40 mg via ORAL
  Filled 2015-10-28 (×2): qty 1

## 2015-10-28 MED ORDER — CHLORHEXIDINE GLUCONATE 4 % EX LIQD
1.0000 | Freq: Once | CUTANEOUS | Status: DC
Start: 2015-10-28 — End: 2015-10-28

## 2015-10-28 MED ORDER — TECHNETIUM TC 99M SULFUR COLLOID
1.0600 | Freq: Once | INTRAVENOUS | Status: DC | PRN
  Administered 2015-10-28: 1.06 via INTRAVENOUS
  Filled 2015-10-28: qty 1.06

## 2015-10-28 MED ORDER — CLINDAMYCIN PHOSPHATE 600 MG/50ML IV SOLN
600.0000 mg | Freq: Once | INTRAVENOUS | Status: AC
  Administered 2015-10-28: 600 mg via INTRAVENOUS

## 2015-10-28 MED ORDER — FENTANYL CITRATE (PF) 100 MCG/2ML IJ SOLN: 25.0000 ug | INTRAMUSCULAR | Status: DC | PRN

## 2015-10-28 MED ORDER — OXYCODONE HCL 5 MG PO TABS
5.0000 mg | ORAL_TABLET | ORAL | Status: DC | PRN
  Administered 2015-10-29: 5 mg via ORAL
  Filled 2015-10-28 (×2): qty 1

## 2015-10-28 MED ORDER — FAMOTIDINE 20 MG PO TABS
ORAL_TABLET | ORAL | Status: AC
  Administered 2015-10-28: 20 mg via ORAL
  Filled 2015-10-28: qty 1

## 2015-10-28 MED ORDER — ONDANSETRON HCL 4 MG/2ML IJ SOLN: 4.0000 mg | Freq: Once | INTRAMUSCULAR | Status: DC | PRN

## 2015-10-28 MED ORDER — ACETAMINOPHEN 10 MG/ML IV SOLN
INTRAVENOUS | Status: AC
  Filled 2015-10-28: qty 100

## 2015-10-28 MED ORDER — ONDANSETRON 4 MG PO TBDP: 4.0000 mg | ORAL_TABLET | Freq: Four times a day (QID) | ORAL | Status: DC | PRN

## 2015-10-28 MED ORDER — SUCCINYLCHOLINE CHLORIDE 20 MG/ML IJ SOLN
INTRAMUSCULAR | Status: DC | PRN
  Administered 2015-10-28: 160 mg via INTRAVENOUS

## 2015-10-28 MED ORDER — CHLORHEXIDINE GLUCONATE 4 % EX LIQD
1.0000 | Freq: Once | CUTANEOUS | Status: DC
Start: 2015-10-29 — End: 2015-10-28

## 2015-10-28 MED ORDER — EPHEDRINE SULFATE 50 MG/ML IJ SOLN
INTRAMUSCULAR | Status: DC | PRN
  Administered 2015-10-28 (×3): 10 mg via INTRAVENOUS

## 2015-10-28 MED ORDER — ACETAMINOPHEN 500 MG PO TABS
1000.0000 mg | ORAL_TABLET | Freq: Four times a day (QID) | ORAL | Status: DC
  Administered 2015-10-28 – 2015-10-30 (×6): 1000 mg via ORAL
  Filled 2015-10-28 (×8): qty 2

## 2015-10-28 MED ORDER — METHYLENE BLUE 1 % INJ SOLN
INTRAMUSCULAR | Status: AC
  Filled 2015-10-28: qty 10

## 2015-10-28 MED ORDER — HYDROCHLOROTHIAZIDE 25 MG PO TABS
25.0000 mg | ORAL_TABLET | Freq: Every day | ORAL | Status: DC
  Administered 2015-10-29 – 2015-10-30 (×2): 25 mg via ORAL
  Filled 2015-10-28 (×3): qty 1

## 2015-10-28 MED ORDER — CLINDAMYCIN PHOSPHATE 600 MG/50ML IV SOLN
INTRAVENOUS | Status: AC
  Administered 2015-10-28: 600 mg via INTRAVENOUS
  Filled 2015-10-28: qty 50

## 2015-10-28 MED ORDER — FAMOTIDINE 20 MG PO TABS
20.0000 mg | ORAL_TABLET | Freq: Once | ORAL | Status: AC
  Administered 2015-10-28: 20 mg via ORAL

## 2015-10-28 MED ORDER — DOCUSATE SODIUM 100 MG PO CAPS
100.0000 mg | ORAL_CAPSULE | Freq: Every day | ORAL | Status: DC
  Administered 2015-10-28 – 2015-10-30 (×3): 100 mg via ORAL
  Filled 2015-10-28 (×3): qty 1

## 2015-10-28 MED ORDER — FENTANYL CITRATE (PF) 100 MCG/2ML IJ SOLN
INTRAMUSCULAR | Status: DC | PRN
  Administered 2015-10-28: 50 ug via INTRAVENOUS
  Administered 2015-10-28: 100 ug via INTRAVENOUS
  Administered 2015-10-28 (×2): 50 ug via INTRAVENOUS
  Administered 2015-10-28: 100 ug via INTRAVENOUS

## 2015-10-28 MED ORDER — KETOROLAC TROMETHAMINE 30 MG/ML IJ SOLN
30.0000 mg | Freq: Four times a day (QID) | INTRAMUSCULAR | Status: DC
  Administered 2015-10-28 – 2015-10-30 (×7): 30 mg via INTRAVENOUS
  Filled 2015-10-28 (×7): qty 1

## 2015-10-28 MED ORDER — HYDROMORPHONE HCL 1 MG/ML IJ SOLN: 0.2500 mg | INTRAMUSCULAR | Status: DC | PRN

## 2015-10-28 MED ORDER — SENNA 8.6 MG PO TABS
1.0000 | ORAL_TABLET | Freq: Two times a day (BID) | ORAL | Status: DC
  Administered 2015-10-28 – 2015-10-30 (×4): 8.6 mg via ORAL
  Filled 2015-10-28 (×4): qty 1

## 2015-10-28 MED ORDER — LEVOTHYROXINE SODIUM 75 MCG PO TABS
75.0000 ug | ORAL_TABLET | Freq: Every day | ORAL | Status: DC
  Administered 2015-10-29 – 2015-10-30 (×2): 75 ug via ORAL
  Filled 2015-10-28 (×2): qty 1

## 2015-10-28 MED ORDER — ONDANSETRON HCL 4 MG/2ML IJ SOLN: 4.0000 mg | Freq: Four times a day (QID) | INTRAMUSCULAR | Status: DC | PRN

## 2015-10-28 MED ORDER — DIPHENHYDRAMINE HCL 50 MG/ML IJ SOLN: 12.5000 mg | Freq: Four times a day (QID) | INTRAMUSCULAR | Status: DC | PRN

## 2015-10-28 MED ORDER — LACTATED RINGERS IV SOLN
INTRAVENOUS | Status: DC
  Administered 2015-10-28 (×2): via INTRAVENOUS

## 2015-10-28 MED ORDER — ONDANSETRON HCL 4 MG/2ML IJ SOLN
INTRAMUSCULAR | Status: DC | PRN
  Administered 2015-10-28: 4 mg via INTRAVENOUS

## 2015-10-28 MED ORDER — DEXAMETHASONE SODIUM PHOSPHATE 4 MG/ML IJ SOLN
INTRAMUSCULAR | Status: DC | PRN
  Administered 2015-10-28: 5 mg via INTRAVENOUS

## 2015-10-28 MED ORDER — SODIUM CHLORIDE 0.9 % IJ SOLN
INTRAMUSCULAR | Status: AC
  Filled 2015-10-28: qty 10

## 2015-10-28 MED ORDER — ENOXAPARIN SODIUM 40 MG/0.4ML ~~LOC~~ SOLN
40.0000 mg | SUBCUTANEOUS | Status: DC
  Administered 2015-10-29 – 2015-10-30 (×2): 40 mg via SUBCUTANEOUS
  Filled 2015-10-28 (×2): qty 0.4

## 2015-10-28 MED ORDER — ATENOLOL 50 MG PO TABS
50.0000 mg | ORAL_TABLET | Freq: Every day | ORAL | Status: DC
  Administered 2015-10-29 – 2015-10-30 (×2): 50 mg via ORAL
  Filled 2015-10-28 (×3): qty 1

## 2015-10-28 MED ORDER — KETOROLAC TROMETHAMINE 30 MG/ML IJ SOLN
INTRAMUSCULAR | Status: DC | PRN
  Administered 2015-10-28: 30 mg via INTRAVENOUS

## 2015-10-28 MED ORDER — HYDROMORPHONE HCL 1 MG/ML IJ SOLN: 0.5000 mg | INTRAMUSCULAR | Status: DC | PRN

## 2015-10-28 SURGICAL SUPPLY — 69 items
ADH LQ OCL WTPRF AMP STRL LF (MISCELLANEOUS) ×2
ADHESIVE MASTISOL STRL (MISCELLANEOUS) ×4 IMPLANT
APPLIER CLIP 9.375 SM OPEN (CLIP)
APR CLP SM 9.3 20 MLT OPN (CLIP)
BLADE SURG 15 STRL LF DISP TIS (BLADE) ×2 IMPLANT
BLADE SURG 15 STRL SS (BLADE) ×4
BLADE SURG SZ11 CARB STEEL (BLADE) ×4 IMPLANT
BNDG GAUZE 4.5X4.1 6PLY STRL (MISCELLANEOUS) ×3 IMPLANT
BULB RESERV EVAC DRAIN JP 100C (MISCELLANEOUS) ×12 IMPLANT
CANISTER SUCT 1200ML W/VALVE (MISCELLANEOUS) ×4 IMPLANT
CHLORAPREP W/TINT 26ML (MISCELLANEOUS) ×4 IMPLANT
CLIP APPLIE 9.375 SM OPEN (CLIP) ×1 IMPLANT
CLOSURE WOUND 1/2 X4 (GAUZE/BANDAGES/DRESSINGS)
CNTNR SPEC 2.5X3XGRAD LEK (MISCELLANEOUS) ×2
CONT SPEC 4OZ STER OR WHT (MISCELLANEOUS) ×2
CONT SPEC 4OZ STRL OR WHT (MISCELLANEOUS) ×2
CONTAINER SPEC 2.5X3XGRAD LEK (MISCELLANEOUS) ×2 IMPLANT
COVER LIGHT HANDLE STERIS (MISCELLANEOUS) ×8 IMPLANT
DECANTER SPIKE VIAL GLASS SM (MISCELLANEOUS) ×8 IMPLANT
DRAIN CHANNEL JP 19F (MISCELLANEOUS) ×12 IMPLANT
DRAPE C-ARM XRAY 36X54 (DRAPES) ×4 IMPLANT
DRAPE LAPAROTOMY TRNSV 106X77 (MISCELLANEOUS) ×4 IMPLANT
DRAPE SHEET LG 3/4 BI-LAMINATE (DRAPES) ×3 IMPLANT
GAUZE FLUFF 18X24 1PLY STRL (GAUZE/BANDAGES/DRESSINGS) ×4 IMPLANT
GLOVE BIO SURGEON STRL SZ7.5 (GLOVE) ×8 IMPLANT
GLOVE BIOGEL PI IND STRL 8 (GLOVE) ×2 IMPLANT
GLOVE BIOGEL PI INDICATOR 8 (GLOVE) ×2
GLOVE PI ORTHOPRO 6.5 (GLOVE) ×2
GLOVE PI ORTHOPRO STRL 6.5 (GLOVE) ×2 IMPLANT
GOWN STRL REUS W/ TWL LRG LVL3 (GOWN DISPOSABLE) ×4 IMPLANT
GOWN STRL REUS W/TWL LRG LVL3 (GOWN DISPOSABLE) ×8
KIT RM TURNOVER STRD PROC AR (KITS) ×4 IMPLANT
LABEL OR SOLS (LABEL) ×1 IMPLANT
LIQUID BAND (GAUZE/BANDAGES/DRESSINGS) ×10 IMPLANT
NDL FILTER BLUNT 18X1 1/2 (NEEDLE) ×1 IMPLANT
NDL HYPO 25X1 1.5 SAFETY (NEEDLE) IMPLANT
NDL HYPO 27GX1-1/4 (NEEDLE) ×1 IMPLANT
NDL SAFETY 18GX1.5 (NEEDLE) ×4 IMPLANT
NDL SAFETY 22GX1.5 (NEEDLE) ×4 IMPLANT
NDL SAFETY ECLIPSE 18X1.5 (NEEDLE) ×1 IMPLANT
NEEDLE FILTER BLUNT 18X 1/2SAF (NEEDLE) ×2
NEEDLE FILTER BLUNT 18X1 1/2 (NEEDLE) ×2 IMPLANT
NEEDLE HYPO 18GX1.5 SHARP (NEEDLE) ×4
NEEDLE HYPO 25X1 1.5 SAFETY (NEEDLE) ×4 IMPLANT
NEEDLE HYPO 27GX1-1/4 (NEEDLE) ×4 IMPLANT
PACK BASIN MINOR ARMC (MISCELLANEOUS) ×4 IMPLANT
PACK PORT-A-CATH (MISCELLANEOUS) ×4 IMPLANT
PAD GROUND ADULT SPLIT (MISCELLANEOUS) ×4 IMPLANT
SLEVE PROBE SENORX GAMMA FIND (MISCELLANEOUS) ×4 IMPLANT
SPONGE LAP 18X18 5 PK (GAUZE/BANDAGES/DRESSINGS) ×4 IMPLANT
STRIP CLOSURE SKIN 1/2X4 (GAUZE/BANDAGES/DRESSINGS) ×1 IMPLANT
SURGI-BRA LG (MISCELLANEOUS) ×3 IMPLANT
SUT ETH BLK MONO 3 0 FS 1 12/B (SUTURE) ×4 IMPLANT
SUT MNCRL 4-0 (SUTURE) ×8
SUT MNCRL 4-0 27XMFL (SUTURE) ×4
SUT PROLENE 3 0 SH DA (SUTURE) ×4 IMPLANT
SUT VIC AB 0 CT1 18XCR BRD 8 (SUTURE) ×1 IMPLANT
SUT VIC AB 0 CT1 27 (SUTURE) ×12
SUT VIC AB 0 CT1 27XCR 8 STRN (SUTURE) ×3 IMPLANT
SUT VIC AB 0 CT1 8-18 (SUTURE) ×4
SUT VIC AB 3-0 SH 27 (SUTURE) ×12
SUT VIC AB 3-0 SH 27X BRD (SUTURE) ×6 IMPLANT
SUTURE MNCRL 4-0 27XMF (SUTURE) ×4 IMPLANT
SYR 20CC LL (SYRINGE) ×3 IMPLANT
SYR 3ML LL SCALE MARK (SYRINGE) ×4 IMPLANT
SYR BULB IRRIG 60ML STRL (SYRINGE) ×3 IMPLANT
SYRINGE 10CC LL (SYRINGE) ×4 IMPLANT
TAPE MICROFOAM 4IN (TAPE) ×4 IMPLANT
WATER STERILE IRR 1000ML POUR (IV SOLUTION) ×4 IMPLANT

## 2015-10-28 NOTE — Anesthesia Procedure Notes (Signed)
Procedure Name: Intubation Date/Time: 10/28/2015 2:23 PM Performed by: Jennette Bill Pre-anesthesia Checklist: Patient identified, Suction available, Emergency Drugs available, Patient being monitored and Timeout performed Patient Re-evaluated:Patient Re-evaluated prior to inductionOxygen Delivery Method: Circle system utilized Preoxygenation: Pre-oxygenation with 100% oxygen Intubation Type: IV induction and Cricoid Pressure applied Ventilation: Mask ventilation without difficulty Laryngoscope Size: Mac and 3 Grade View: Grade III Tube type: Oral Tube size: 7.0 mm Airway Equipment and Method: Stylet Placement Confirmation: ETT inserted through vocal cords under direct vision,  positive ETCO2 and breath sounds checked- equal and bilateral

## 2015-10-28 NOTE — Anesthesia Preprocedure Evaluation (Addendum)
Anesthesia Evaluation  Patient identified by MRN, date of birth, ID band Patient awake    Reviewed: Allergy & Precautions, H&P , NPO status , Patient's Chart, lab work & pertinent test results, reviewed documented beta blocker date and time   History of Anesthesia Complications (+) PONV and history of anesthetic complications  Airway Mallampati: III  TM Distance: >3 FB Neck ROM: full    Dental  (+) Teeth Intact   Pulmonary neg pulmonary ROS, Current Smoker,    Pulmonary exam normal        Cardiovascular hypertension, negative cardio ROS Normal cardiovascular exam Rhythm:regular Rate:Normal     Neuro/Psych negative neurological ROS  negative psych ROS   GI/Hepatic negative GI ROS, Neg liver ROS, GERD  ,  Endo/Other  negative endocrine ROSHypothyroidism Morbid obesity  Renal/GU negative Renal ROS  negative genitourinary   Musculoskeletal   Abdominal   Peds  Hematology negative hematology ROS (+)   Anesthesia Other Findings   Reproductive/Obstetrics negative OB ROS                             Anesthesia Physical Anesthesia Plan  ASA: III  Anesthesia Plan: General ETT   Post-op Pain Management:    Induction:   Airway Management Planned:   Additional Equipment:   Intra-op Plan:   Post-operative Plan:   Informed Consent: I have reviewed the patients History and Physical, chart, labs and discussed the procedure including the risks, benefits and alternatives for the proposed anesthesia with the patient or authorized representative who has indicated his/her understanding and acceptance.     Plan Discussed with: CRNA  Anesthesia Plan Comments:         Anesthesia Quick Evaluation

## 2015-10-28 NOTE — Transfer of Care (Signed)
Immediate Anesthesia Transfer of Care Note  Patient: Kaylee Reyes  Procedure(s) Performed: Procedure(s): MASTECTOMY WITH SENTINEL LYMPH NODE BIOPSY (Bilateral)  Patient Location: PACU  Anesthesia Type:General  Level of Consciousness: awake  Airway & Oxygen Therapy: Patient Spontanous Breathing and Patient connected to face mask oxygen  Post-op Assessment: Report given to RN  Post vital signs: Reviewed  Last Vitals:  Filed Vitals:   10/28/15 1806 10/28/15 1807  BP: 160/86 160/86  Pulse: 71 75  Temp: 37 C 37 C  Resp: 18 12    Complications: No apparent anesthesia complications

## 2015-10-28 NOTE — OR Nursing (Signed)
After discussion with Dr. Baruch Merl he instructed me to start patient's IV in her hand and they would move it in the OR if needed. Patient with small veins, #22 gauge was placed in the left hand with use of the vein finder.

## 2015-10-28 NOTE — H&P (View-Only) (Signed)
Subjective:     Patient ID: Kaylee Reyes, female   DOB: 02/09/1951, 64 y.o.   MRN: [id number]  HPI   Kaylee Reyes presents for the evaluation of a breast mass. Onset of the symptoms was 5 months ago. Patient thought it was just scar tissue from previous area but then felt it getting bigger and sought treatment about a month ago.  She then had abnormal mammogram and resultant biopsy showing invasive lobular carcinoma. Contributing factors include menopause after age 51 and previous breast biopsy to same area and on opposite breast.  She does not have any family history of breast or other types of cancer.  She had two children but did not breast feed.  She did take birth control pills for a year when she was 36.   She denies any fever, chills, changes to skin of breast, puckering or discharge. She does state that there is some bruising post biopsy and some soreness.   The following portions of the patient's history were reviewed and updated as appropriate: allergies, current medications, past family history, past medical history, past social history, past surgical history and problem list.   Past Medical History  Diagnosis Date  . Hypertension   . GERD (gastroesophageal reflux disease)   . Thyroid disease   . Hyperlipidemia   . History of chicken pox    Past Surgical History  Procedure Laterality Date  . Cholecystectomy  1990  . Wrist surgery    . Cesarean section  1988  . Exploratory laparotomy      Left breast  . Abdominal hysterectomy  07/13/2013    Total. with BSO for postmenopausal bleeding and fibroids with large cervical polyp. Dr. Kletyt and Dr. Jackson at [facility]  . Breast surgery  2011    BREAST BIOPSY  . Abdominal ultrasound  04/17/2011    Hepatomegaly with borderline slenomegaly. Suggestive of fatty liver. s/p cholecystectomy. Portions of aorta obscured   Family History  Problem Relation Age of Onset  . Hypertension Mother   . Thyroid disease Mother   . Hypertension  Brother   . Diabetes Brother    Social History   Social History  . Marital Status: Married    Spouse Name: N/A  . Number of Children: 2  . Years of Education: N/A   Social History Main Topics  . Smoking status: Never Smoker   . Smokeless tobacco: None  . Alcohol Use: No  . Drug Use: No  . Sexual Activity: Not Asked   Other Topics Concern  . None   Social History Narrative    Current outpatient prescriptions:  .  Alum & Mag Hydroxide-Simeth (MAGIC MOUTHWASH) SOLN, RINSE AND SPIT ONE TEASPOONFUL BY MOUTH EVERY 3 TO 4 HOURS AS NEEDED, Disp: 100 mL, Rfl: 1 .  atenolol (TENORMIN) 50 MG tablet, TAKE ONE TABLET BY MOUTH ONCE DAILY, Disp: 30 tablet, Rfl: 5 .  b complex vitamins tablet, Take 1 tablet by mouth daily., Disp: , Rfl:  .  Cholecalciferol (VITAMIN D) 2000 UNITS tablet, Take 2,000 Units by mouth daily., Disp: , Rfl:  .  Coenzyme Q10 (COQ10) 100 MG CAPS, Take 1 capsule by mouth daily., Disp: , Rfl:  .  docusate sodium (COLACE) 100 MG capsule, Take 100 mg by mouth daily., Disp: , Rfl:  .  fexofenadine (ALLEGRA) 180 MG tablet, Take 180 mg by mouth daily., Disp: , Rfl:  .  hydrochlorothiazide (HYDRODIURIL) 25 MG tablet, Take 1 tablet (25 mg total) by mouth daily., Disp:   30 tablet, Rfl: 12 .  ketoconazole (NIZORAL) 2 % cream, Apply 1 application topically daily., Disp: 50 g, Rfl: 0 .  ketoconazole (NIZORAL) 2 % cream, Apply 1 application topically 1 day or 1 dose., Disp: , Rfl:  .  lansoprazole (PREVACID) 15 MG capsule, Take 15 mg by mouth daily at 12 noon., Disp: , Rfl:  .  levothyroxine (SYNTHROID, LEVOTHROID) 75 MCG tablet, TAKE ONE TABLET BY MOUTH ONCE DAILY, Disp: 30 tablet, Rfl: 5 .  meclizine (ANTIVERT) 25 MG tablet, TAKE ONE TABLET BY MOUTH EVERY 4 TO 6 HOURS AS NEEDED, Disp: 30 tablet, Rfl: 3 .  Omega-3 Fatty Acids (FISH OIL) 1000 MG CAPS, Take 1 capsule by mouth once., Disp: , Rfl:  .  ondansetron (ZOFRAN) 4 MG tablet, Take 1 tablet (4 mg total) by mouth daily as needed  for nausea or vomiting., Disp: 30 tablet, Rfl: 1 .  ondansetron (ZOFRAN) 4 MG tablet, Take 1 tablet by mouth., Disp: , Rfl:  .  triamcinolone cream (KENALOG) 0.5 %, Apply 1 application topically 3 (three) times daily. Apply 1 application to ear 2-3 time daily, Disp: , Rfl:  .  Turmeric Curcumin 500 MG CAPS, Take 1 tablet by mouth 1 day or 1 dose., Disp: , Rfl:  Allergies  Allergen Reactions  . Penicillins Shortness Of Breath  . Pravastatin Sodium     Muscle pain, muscle cramps, muscle weakness     Review of Systems  Constitutional: Negative for fever, activity change, appetite change, fatigue and unexpected weight change.  HENT: Negative for congestion and sore throat.   Respiratory: Negative for cough, chest tightness, shortness of breath and wheezing.   Cardiovascular: Negative for chest pain and palpitations.  Gastrointestinal: Negative for nausea, vomiting, diarrhea, constipation and abdominal distention.  Genitourinary: Negative for dysuria, frequency and hematuria.  Musculoskeletal: Negative for back pain and joint swelling.  Skin: Negative for color change, pallor, rash and wound.  Hematological: Negative for adenopathy. Bruises/bleeds easily.  Psychiatric/Behavioral: Negative for confusion and agitation. The patient is nervous/anxious.   All other systems reviewed and are negative.  Filed Vitals:   10/01/15 0824  BP: 167/111  Pulse: 66  Temp: 98.1 F (36.7 C)      Objective:   Physical Exam  Constitutional: She is oriented to person, place, and time. She appears well-developed and well-nourished. No distress.  HENT:  Head: Normocephalic and atraumatic.  Right Ear: External ear normal.  Left Ear: External ear normal.  Nose: Nose normal.  Mouth/Throat: Oropharynx is clear and moist. No oropharyngeal exudate.  Eyes: Conjunctivae are normal. Pupils are equal, round, and reactive to light. No scleral icterus.  Neck: Normal range of motion. Neck supple. No tracheal  deviation present. No thyromegaly present.  Cardiovascular: Normal rate, regular rhythm, normal heart sounds and intact distal pulses.  Exam reveals no gallop and no friction rub.   No murmur heard. Pulmonary/Chest: Effort normal and breath sounds normal. No respiratory distress. She has no wheezes. She has no rales.  Abdominal: Soft. Bowel sounds are normal. She exhibits no distension. There is no tenderness.  Musculoskeletal: Normal range of motion. She exhibits no edema or tenderness.  Lymphadenopathy:    She has no cervical adenopathy.       Right cervical: No superficial cervical, no deep cervical and no posterior cervical adenopathy present.      Left cervical: No superficial cervical, no deep cervical and no posterior cervical adenopathy present.    She has no axillary adenopathy.         Right axillary: No pectoral and no lateral adenopathy present.       Left axillary: No pectoral and no lateral adenopathy present. Neurological: She is alert and oriented to person, place, and time. No cranial nerve deficit.  Skin: Skin is warm and dry. No rash noted. No erythema. No pallor.  Psychiatric: She has a normal mood and affect. Her behavior is normal. Judgment and thought content normal.  Vitals reviewed. Breast exam:  Right breast: bruising between 9-12oclock positions, palpable mass about 5cm x4cm in size, mobile, tender, no nipple discharge, retraction or puckering, no lymphadenopathy in cervical, supraclavicular or axilla Left breast: 2cm cyst palpable with some tenderness at 3 oclock position, soft, rubbery, mobile, no skin changes, retraction or nipple discharge, no cervical, supraclavicular or axillary lymphadenopathy      Assessment:     64 yr old female with right breast invasive lobular carcinoma    Plan:     I discussed extensively lobular carcinoma the treatment options, risks, benefits, alternatives and expected outcomes. I personally reviewed the pathology report, the images,  radiology report and spoke with our breast radiologist about her case. I explained to the patient that lobular carcinoma does not show as well on traditional imagining and that an MRI of both breast would be necessary to get an idea of the size of the lesion, as well as rule out any pathology in the contralateral breast.  I explained that it would depend on the size of lesion in comparison to her breast size if lumpectomy would be offered, verses neoadjuvant chemo prior to lumpectomy or mastectomy.  I discussed that the risks of recurrence usually are about equal for lumpectomy + radiation and mastectomy but that this all depends on multiple factors.  Patient has gone through a lot of anxiety from breast exams, mammograms and biopsies on both breast in the past.  She is leaning more toward bilateral mastectomy to avoid this anxiety in the future.  I will schedule her for Breast MRI ASAP and will have her f/u in clinic with those results so we can start operative planning.    I spent 60 minutes discussing the treatment, education and coordinating patient care.        

## 2015-10-28 NOTE — Interval H&P Note (Signed)
History and Physical Interval Note:  10/28/2015 2:01 PM  Kaylee Reyes  has presented today for surgery, with the diagnosis of INVASIVE LOBULAR CARCINOMA RIGHT BREAST, LEFT BREAST LUMP  The various methods of treatment have been discussed with the patient and family. After consideration of risks, benefits and other options for treatment, the patient has consented to  Procedure(s): MASTECTOMY WITH SENTINEL LYMPH NODE BIOPSY (Bilateral) INSERTION PORT-A-CATH (N/A) as a surgical intervention .  The patient's history has been reviewed, patient examined, no change in status, stable for surgery.  I have reviewed the patient's chart and labs.  Questions were answered to the patient's satisfaction.     Catherine L Loflin

## 2015-10-28 NOTE — Progress Notes (Signed)
  Oncology Nurse Navigator Documentation    Navigator Encounter Type: Treatment (10/28/15 1100) Patient Visit Type: Surgery (10/28/15 1100) Treatment Phase: Treatment (10/28/15 1100) Barriers/Navigation Needs: No barriers at this time;Family concerns (10/28/15 1100) Education: Preparing for Upcoming Surgery/ Treatment (10/28/15 1100)              Time Spent with Patient: 15 (10/28/15 1100)   Met patient, family and friends prior to surgery.  Support offered.

## 2015-10-29 ENCOUNTER — Telehealth: Payer: Self-pay | Admitting: Surgery

## 2015-10-29 DIAGNOSIS — D0511 Intraductal carcinoma in situ of right breast: Secondary | ICD-10-CM | POA: Diagnosis not present

## 2015-10-29 LAB — CBC
HEMATOCRIT: 31.5 % — AB (ref 35.0–47.0)
HEMOGLOBIN: 10.6 g/dL — AB (ref 12.0–16.0)
MCH: 28.5 pg (ref 26.0–34.0)
MCHC: 33.7 g/dL (ref 32.0–36.0)
MCV: 84.7 fL (ref 80.0–100.0)
Platelets: 174 10*3/uL (ref 150–440)
RBC: 3.72 MIL/uL — ABNORMAL LOW (ref 3.80–5.20)
RDW: 14.6 % — ABNORMAL HIGH (ref 11.5–14.5)
WBC: 10.5 10*3/uL (ref 3.6–11.0)

## 2015-10-29 LAB — BASIC METABOLIC PANEL
Anion gap: 9 (ref 5–15)
BUN: 15 mg/dL (ref 6–20)
CHLORIDE: 103 mmol/L (ref 101–111)
CO2: 26 mmol/L (ref 22–32)
Calcium: 8.4 mg/dL — ABNORMAL LOW (ref 8.9–10.3)
Creatinine, Ser: 1.02 mg/dL — ABNORMAL HIGH (ref 0.44–1.00)
GFR calc Af Amer: >60 mL/min (ref >60)
GFR calc non Af Amer: 57 mL/min — ABNORMAL LOW (ref >60)
Glucose, Bld: 133 mg/dL — ABNORMAL HIGH (ref 65–99)
POTASSIUM: 4 mmol/L (ref 3.5–5.1)
SODIUM: 138 mmol/L (ref 135–145)

## 2015-10-29 NOTE — Telephone Encounter (Signed)
Kaylee Reyes,   Koren Shiver called from the [employer] [employer] She called about patient, Kaylee Reyes. Ms Stuntz had a double mastectomy on 11/22 with Loflin and will be discharged tomorrow on Thanksgiving (11/24). She would like her discharge medications called in today so her daughter can go ahead and pick those up. She had already talked to Dr Azalee Course about this and Dr Azalee Course said she would do this, but patient forgot to remind her. Could you please talk to Dr Azalee Course and make sure these medications are called in today to patients pharmacy. Thank you.

## 2015-10-29 NOTE — Op Note (Signed)
Bilateral breast mastectomy with Sentinal Node Biopsy Procedure Note  Indications: This patient presents with history of right breast cancer and suspicious nodules in Left breast with clinically negative axillary lymph node exam.  Pre-operative Diagnosis: bilateral breast disease  Post-operative Diagnosis: bilateral breast disease  Surgeon: Hubbard Robinson, MD  Assistants: Marta Lamas, MD  Anesthesia: General endotracheal anesthesia  ASA Class: 3  Procedure Details  The patient was seen in the Holding Room. The risks, benefits, complications, treatment options, and expected outcomes were discussed with the patient. The possibilities of reaction to medication, pulmonary aspiration, bleeding, infection, the need for additional procedures, failure to diagnose a condition, and creating a complication requiring transfusion or operation were discussed with the patient. The patient concurred with the proposed plan, giving informed consent.  he patient was taken to Operating Room and identified as Kaylee Reyes and the procedure verified as bilateral mastectomy and bilateral Sentinal Node Biopsy. A Time Out was held and the above information confirmed.  After induction of anesthesia, the bilateral arm, breast, and chest were prepped and draped in standard fashion.  Methylene blue dye was injected into bilateral areolar region.  The mastectomy was first performed on the right by creating an elliptical incision of the right breast.  The skin flaps were raised using electrocautery and elevating skin with retractors.  Dissection was carried out to the level of the clavical, sternum, inferior mammory fold and through the axillary tail and carried down to the pectoral fascia.  Orientation sutures were placed and the specimen was sent to pathology. Hemostasis was achieved with cautery. Using a hand-held gamma probe, axillary sentinel nodes were identified.  Dissection was carried through the axilla.  There  were two blue axillary sentinel nodes removed and submitted to pathology. Two 57 French JP drains were placed one extending to the mastectomy site and the second in the lateral axillary region. The wound was irrigated out and hemostasis achieved.  The drains were sutured in the 3-0 nylon.  The deep dermal layer was closed with 2-0 Vicryl and the  incision was closed with a 4-0 monocryl subcuticular closure.  Sterile surgical glue was applied.   The mastectomy was first performed on the left by creating an elliptical incision of the left breast with a  10-blade scalpel. The skin flaps were raised using electrocautery and elevating skin with retractors.  Dissection was carried out to the level of the clavical, sternum, inferior mammory fold and through the axillary tail and carried down to the pectoral fascia.  Orientation sutures were placed and the specimen was sent to pathology. Hemostasis was achieved with cautery. Using a hand-held gamma probe, axillary sentinel nodes were identified.  Dissection was carried through the axilla.  There were four axillary sentinel nodes that were identified as at 10% of the maximal count on the gamaprobe and these were removed and submitted to pathology. Two 62 French JP drains were placed one extending to the mastectomy site and the second in the lateral axillary region. The drains were sutured in the 3-0 nylon.  The wound was irrigated out and hemostasis achieved. The deep dermal layer was closed with 2-0 Vicryl and the  incision was closed with a 4-0 monocryl subcuticular closure.  Sterile surgical glue was applied.     At the end of the operation, all sponge, instrument, and needle counts were correct.  Gauze fluff padding and a surgical bra were then placed on the patient for dressing.   Findings: grossly clear surgical  margins and no adenopathy  Estimated Blood Loss:  less than 50 mL         Drains: Two JP drains to each breast as described above         Total IV  Fluids: 2080ml         Specimens: Right breast; Right sentinel lymph node #1 and 2 Left breast; Left sentinel lymph node #1,2,3,and 4                Complications:  None; patient tolerated the procedure well.         Disposition: PACU - hemodynamically stable.         Condition: stable

## 2015-10-29 NOTE — Telephone Encounter (Signed)
Returned phone call to [place] at this time. No answer. Left voicemail stating that this was discussed with Dr. Azalee Course yesterday and she will be rounding to see the patient shortly and will write prescriptions at that time.

## 2015-10-29 NOTE — Progress Notes (Signed)
Pt arrived on unit

## 2015-10-29 NOTE — Progress Notes (Signed)
64 yr old POD#1 from Bilateral mastectomy with bilateral sentinel lymph node biopsy for Right lobular breast cancer and suspicious nodules in left breast.  Patient doing well, not complaining of pain at this time.  She is getting up to walk now.  Denies any complaints.   Filed Vitals:   10/29/15 0600 10/29/15 1321  BP: 124/56 117/49  Pulse: 60 62  Temp: 97.5 F (36.4 C) 98.1 F (36.7 C)  Resp:  16   PE:  Gen: NAD, sitting up in bed, comfortable Chest:  Right incision site with ecchymosis/bruising surrounding incision site, good capillary refill, no erythema or drainage;  Left Incision site: likewise with ecchymosis surrounding incision site but good capillary refill, no erythema or drainage;  Drain minimal output with serous drainage.   A/P: Encourage ambulation, continue IV toradol, teach drain care, will have her stay overnight to evaluate ecchymosis

## 2015-10-30 DIAGNOSIS — D0511 Intraductal carcinoma in situ of right breast: Secondary | ICD-10-CM | POA: Diagnosis not present

## 2015-10-30 MED ORDER — CYCLOBENZAPRINE HCL 7.5 MG PO TABS: 7.5000 mg | ORAL_TABLET | Freq: Three times a day (TID) | ORAL | Status: DC

## 2015-10-30 MED ORDER — OXYCODONE-ACETAMINOPHEN 5-325 MG PO TABS: 1.0000 | ORAL_TABLET | ORAL | Status: DC | PRN

## 2015-10-30 NOTE — Progress Notes (Signed)
called patient back  around 16:00.  Dr.Loflin talked to patient regarding drainage around JP tube site.

## 2015-10-30 NOTE — Progress Notes (Signed)
Alert and oriented. Vss. No signs of acute distress. JP drains intact. Discharge instructions given. Patient verbalized understansding. No other issues observed.

## 2015-10-31 ENCOUNTER — Other Ambulatory Visit: Payer: Self-pay | Admitting: Surgery

## 2015-10-31 DIAGNOSIS — C50911 Malignant neoplasm of unspecified site of right female breast: Secondary | ICD-10-CM

## 2015-10-31 LAB — SURGICAL PATHOLOGY

## 2015-10-31 MED ORDER — CYCLOBENZAPRINE HCL 5 MG PO TABS: 5.0000 mg | ORAL_TABLET | Freq: Three times a day (TID) | ORAL | Status: DC | PRN

## 2015-10-31 NOTE — Anesthesia Postprocedure Evaluation (Signed)
Anesthesia Post Note  Patient: Kaylee Reyes  Procedure(s) Performed: Procedure(s) (LRB): MASTECTOMY WITH SENTINEL LYMPH NODE BIOPSY (Bilateral)  Patient location during evaluation: PACU Anesthesia Type: General Level of consciousness: awake, awake and alert and oriented Pain management: pain level controlled Vital Signs Assessment: post-procedure vital signs reviewed and stable Respiratory status: spontaneous breathing Cardiovascular status: blood pressure returned to baseline Anesthetic complications: no    Last Vitals:  Filed Vitals:   10/29/15 2050 10/30/15 0555  BP: 124/61 116/56  Pulse: 59 56  Temp: 36.8 C 36.7 C  Resp: 17 18    Last Pain:  Filed Vitals:   10/30/15 1008  PainSc: 0-No pain                 Appolonia Ackert

## 2015-10-31 NOTE — Discharge Summary (Signed)
Physician Discharge Summary  Patient ID: Kaylee Reyes MRN: [id number] DOB/AGE: 02/10/1951 64 y.o.  Admit date: 10/28/2015 Discharge date: 10/31/2015  Admission Diagnoses: Right lobular breast CA, Left breast mass  Discharge Diagnoses:  Principal Problem:   Invasive lobular carcinoma of right breast, stage 2 ([facility]) Active Problems:   Morbid obesity (HCC)   Hypertension   GERD (gastroesophageal reflux disease)   Left breast lump   S/P bilateral mastectomy   Discharged Condition: good  Hospital Course: 64 yr old female with right lobular breast CA and left breast with two suspicious nodules of MRI, she underwent Bilateral mastectomy with bilateral sentinel lymphnode biopsies.  She tolerated the procedure well. She was able to start moving, walking in hallways, eating regular diet and pain tolerated by po meds.  She has 4 JP drains which she was taught how to measure, empty and recharge.   Consults: None  Significant Diagnostic Studies:   Treatments: surgery: Bilateral mastectomy with bilateral sentinel lymph node biopies  Discharge Exam: Blood pressure 116/56, pulse 56, temperature 98.1 F (36.7 C), temperature source Oral, resp. rate 18, height 5\' 6"  (1.676 m), weight 138 lb 9.6 oz (62.869 kg), SpO2 96 %. General appearance: alert and no distress Incision/Wound:  Right and left mastectomy sites with some bruising and ecchymosis surrounding incisions but no erythema or cellulitis.  JP drains with serous drainage   Disposition: 01-Home or Self Care  Discharge Instructions    Call MD for:  persistant nausea and vomiting    Complete by:  As directed      Call MD for:  redness, tenderness, or signs of infection (pain, swelling, redness, odor or green/yellow discharge around incision site)    Complete by:  As directed      Call MD for:  severe uncontrolled pain    Complete by:  As directed      Call MD for:  temperature >100.4    Complete by:  As directed      Diet - low  sodium heart healthy    Complete by:  As directed      Discharge instructions    Complete by:  As directed   Measure and record JP drain output daily     Driving Restrictions    Complete by:  As directed   No driving while on prescription pain medication     Increase activity slowly    Complete by:  As directed      Lifting restrictions    Complete by:  As directed   No lifting over 15 lbs for 4 weeks     No wound care    Complete by:  As directed             Medication List    TAKE these medications        atenolol 50 MG tablet  Commonly known as:  TENORMIN  Take 1 tablet (50 mg total) by mouth daily.     b complex vitamins tablet  Take 1 tablet by mouth daily.     docusate sodium 100 MG capsule  Commonly known as:  COLACE  Take 100 mg by mouth daily.     fexofenadine 180 MG tablet  Commonly known as:  ALLEGRA  Take 180 mg by mouth daily.     Fish Oil 1000 MG Caps  Take 1 capsule by mouth once.     hydrochlorothiazide 25 MG tablet  Commonly known as:  HYDRODIURIL  Take 1 tablet (25 mg total)  by mouth daily.     hydrochlorothiazide 25 MG tablet  Commonly known as:  HYDRODIURIL  Take 1 tablet by mouth daily.     ketoconazole 2 % cream  Commonly known as:  NIZORAL  Apply 1 application topically daily.     lansoprazole 15 MG capsule  Commonly known as:  PREVACID  Take 15 mg by mouth daily at 12 noon.     levothyroxine 75 MCG tablet  Commonly known as:  SYNTHROID, LEVOTHROID  TAKE ONE TABLET BY MOUTH ONCE DAILY     magic mouthwash Soln  RINSE AND SPIT ONE TEASPOONFUL BY MOUTH EVERY 3 TO 4 HOURS AS NEEDED     meclizine 25 MG tablet  Commonly known as:  ANTIVERT  TAKE ONE TABLET BY MOUTH EVERY 4 TO 6 HOURS AS NEEDED     oxyCODONE-acetaminophen 5-325 MG tablet  Commonly known as:  ROXICET  Take 1-2 tablets by mouth every 4 (four) hours as needed for severe pain.     Turmeric Curcumin 500 MG Caps  Take 1 tablet by mouth 1 day or 1 dose.     Vitamin D  2000 UNITS tablet  Take 2,000 Units by mouth daily.           Follow-up Information    Follow up with Hubbard Robinson, MD. Call in 1 week.   Specialty:  Surgery   Why:  drain removal, , For wound re-check   Contact information:   [place] [address] [place] [address] [phone]       Signed: Hubbard Robinson 10/31/2015, 3:47 PM

## 2015-11-03 ENCOUNTER — Telehealth: Payer: Self-pay

## 2015-11-03 NOTE — Telephone Encounter (Signed)
Post discharge call to patient made at this time. Pain is controlled with minimal medication. States that drains are continuing to drain without difficulty with approximately 120cc at max daily between all drains. She did state that where bruising has occurred, skin is beginning to peel away. I did explain that this does happen with significant bruising and offered reassurance with this. She was instructed to keep this area covered with a dry dressing until skin is replaced in order to not expose this area to possible pathogens on her clothes/nightgown. I also expressed the importance of not applying any creams / lotions, etc to her incisions or site that skin is sloughing. She verbalizes understanding of this information. She denies fever and any further concern at this time.   Confirmed patient appointment scheduled.   Encouraged patient to call with any questions that arise prior to appointment.

## 2015-11-06 ENCOUNTER — Other Ambulatory Visit: Payer: Self-pay | Admitting: *Deleted

## 2015-11-06 ENCOUNTER — Ambulatory Visit (INDEPENDENT_AMBULATORY_CARE_PROVIDER_SITE_OTHER): Payer: 59 | Admitting: Surgery

## 2015-11-06 ENCOUNTER — Encounter: Payer: Self-pay | Admitting: Surgery

## 2015-11-06 VITALS — BP 129/84 | HR 92 | Temp 99.1°F | Wt 290.0 lb

## 2015-11-06 DIAGNOSIS — N6320 Unspecified lump in the left breast, unspecified quadrant: Secondary | ICD-10-CM

## 2015-11-06 DIAGNOSIS — Z9013 Acquired absence of bilateral breasts and nipples: Secondary | ICD-10-CM

## 2015-11-06 DIAGNOSIS — C50911 Malignant neoplasm of unspecified site of right female breast: Secondary | ICD-10-CM

## 2015-11-06 DIAGNOSIS — N63 Unspecified lump in breast: Secondary | ICD-10-CM

## 2015-11-06 MED ORDER — CYCLOBENZAPRINE HCL 5 MG PO TABS: 5.0000 mg | ORAL_TABLET | Freq: Three times a day (TID) | ORAL | Status: DC | PRN

## 2015-11-06 MED ORDER — PERCOCET 5-325 MG PO TABS: 1.0000 | ORAL_TABLET | ORAL | Status: DC | PRN

## 2015-11-06 NOTE — Patient Instructions (Signed)
Follow up next week on 11/12/15 @ 0830 in the [place] office with Dr Azalee Course to check your incision sites. Please call our office if you have any questions.

## 2015-11-06 NOTE — Progress Notes (Signed)
64 yr old female s/p Bilateral mastectomy for Right lobular carcinoma and left suspicious mass on MRI.  Patient is tired and feeling a little down today. She states getting some energy back but she is nervous about treatment.  She states only a few oz of fluid draining from JP drains daily.  She states pain got really bad yesterday when she tried to go without pain medication for most of the day.    Filed Vitals:   11/06/15 0915  BP: 129/84  Pulse: 92  Temp: 99.1 F (37.3 C)   PE:  Gen: sad, tearing up Chest:  Right mastectomy site:  Some bruising over site with scattered blister and darker bruising toward midline, doe not appear to be sloughing tissue, JP drains, serous output, removed Left mastectomy site:  Some areas of blisterlike tissue with ecchymosis, likely superficial necrolysis, not sloughing yet but likely will soon, JP drains also serous, removed today  A/P:   Wound care:  I discussed that with the areas of darker tissue, likely from a blood supply issue and that the superificial skin, especially in the blister like areas will likely slough off, will have her place vaseline gauze dressings to the area once daily.  And have her f/u in 1 week for a wound check   I also discussed her pathology results with benign disease in the left breast as well as benign lymph nodes.  I did let her know that the two lymph nodes in the right axilla were positive for metastatic disease as well.  I will discuss with Dr. Grayland Ormond and hopefully get her placed on tumor board again to discuss if further axillary excision needed, would likely place port at the same time.  Will have her f/u in 1 week.

## 2015-11-07 ENCOUNTER — Ambulatory Visit: Payer: 59 | Admitting: Surgery

## 2015-11-10 ENCOUNTER — Encounter: Payer: Self-pay | Admitting: Radiation Oncology

## 2015-11-10 ENCOUNTER — Ambulatory Visit
Admission: RE | Admit: 2015-11-10 | Discharge: 2015-11-10 | Disposition: A | Discharge status: Home / Self Care | Payer: BLUE CROSS/BLUE SHIELD | Source: Ambulatory Visit | Attending: Radiation Oncology | Admitting: Radiation Oncology

## 2015-11-10 ENCOUNTER — Other Ambulatory Visit: Payer: Self-pay | Admitting: *Deleted

## 2015-11-10 ENCOUNTER — Inpatient Hospital Stay: Payer: 59 | Attending: Oncology | Admitting: Oncology

## 2015-11-10 VITALS — BP 121/78 | HR 41 | Temp 96.3°F | Resp 18 | Wt 288.7 lb

## 2015-11-10 DIAGNOSIS — K219 Gastro-esophageal reflux disease without esophagitis: Secondary | ICD-10-CM | POA: Insufficient documentation

## 2015-11-10 DIAGNOSIS — E079 Disorder of thyroid, unspecified: Secondary | ICD-10-CM | POA: Insufficient documentation

## 2015-11-10 DIAGNOSIS — Z9013 Acquired absence of bilateral breasts and nipples: Secondary | ICD-10-CM | POA: Diagnosis not present

## 2015-11-10 DIAGNOSIS — I10 Essential (primary) hypertension: Secondary | ICD-10-CM | POA: Insufficient documentation

## 2015-11-10 DIAGNOSIS — Z170 Estrogen receptor positive status [ER+]: Secondary | ICD-10-CM | POA: Diagnosis not present

## 2015-11-10 DIAGNOSIS — C50912 Malignant neoplasm of unspecified site of left female breast: Secondary | ICD-10-CM | POA: Diagnosis not present

## 2015-11-10 DIAGNOSIS — E039 Hypothyroidism, unspecified: Secondary | ICD-10-CM | POA: Insufficient documentation

## 2015-11-10 DIAGNOSIS — Z79899 Other long term (current) drug therapy: Secondary | ICD-10-CM | POA: Diagnosis not present

## 2015-11-10 DIAGNOSIS — C50911 Malignant neoplasm of unspecified site of right female breast: Secondary | ICD-10-CM

## 2015-11-10 DIAGNOSIS — E785 Hyperlipidemia, unspecified: Secondary | ICD-10-CM | POA: Insufficient documentation

## 2015-11-10 NOTE — Consult Note (Signed)
Except an outstanding is perfect of Radiation Oncology NEW PATIENT EVALUATION  Name: Kaylee Reyes  MRN: [id number]  Date:   11/10/2015     DOB: 1950/12/28   This 64 y.o. 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 Breast Cancer    Pt is here for initial consultation of breast cancer.      DIAGNOSIS: The encounter diagnosis was Malignant neoplasm of right female breast, unspecified site of breast ([place]).   PREVIOUS INVESTIGATIONS:  Pathology reports reviewed Mammograms reviewed and PET scan reviewed Clinical notes reviewed  HPI: Patient is a 64 year old female with history of benign breast disease status post lumpectomy several years prior who noticed an enlarging mass in the right breast on self-examination. Mammogram revealed a 3.6 cm mass spiculated irregular margins in the 12:00 position of the right breast. This was followed by breast MRI scan showing another 4 mm lesion in the right breast as well as an 8 mm oval mass in the central left breast. The biopsy-proven malignancy in the superior right breast measuring up to 4.8 cm. PET scan demonstrated no evidence of hypermetabolic activity either in axillary region or distant disease. 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 Margins were clear. 2 out of 6 sentinel lymph nodes were positive for metastatic disease. She's had a discussion with medical oncology regarding systemic chemotherapy and is seeing him again today. She's here today for radiation oncology  opinion. She is slowly recovering from bilateral mastectomies. She does have comorbidities including hypertension GERD and obesity.  PLANNED TREATMENT REGIMEN: Right chest wall peripheral lymphatic radiation  PAST MEDICAL HISTORY:  has a past medical history of Hypertension; GERD (gastroesophageal reflux disease); Thyroid disease; Hyperlipidemia; History of chicken pox; Hypothyroidism; and Cancer ([place]).    PAST SURGICAL HISTORY:  Past Surgical History  Procedure Laterality Date  . Cholecystectomy  1990  . Wrist surgery    . Cesarean section  1988  . Exploratory laparotomy      Left breast  . Abdominal hysterectomy  07/13/2013    Total. with BSO for postmenopausal bleeding and fibroids with large cervical polyp. Dr. Levy Sjogren and Dr. Glennon Mac at [facility]  . Breast surgery  2011    BREAST BIOPSY  . Abdominal ultrasound  04/17/2011    Hepatomegaly with borderline slenomegaly. Suggestive of fatty liver. s/p cholecystectomy. Portions of aorta obscured  . Mastectomy w/ sentinel node biopsy Bilateral 10/28/2015    Procedure: MASTECTOMY WITH SENTINEL LYMPH NODE BIOPSY;  Surgeon: Hubbard Robinson, MD;  Location: [facility] ORS;  Service: General;  Laterality: Bilateral;    FAMILY HISTORY: family history includes Diabetes in her brother; Hypertension in her brother and mother; Thyroid disease in her mother.  SOCIAL HISTORY:  reports that she has never smoked. She has never used smokeless tobacco. She reports that she does not drink alcohol or use illicit drugs.  ALLERGIES: Penicillins; Nickel; and Pravastatin sodium  MEDICATIONS:  Current Outpatient Prescriptions  Medication Sig Dispense Refill  . Alum & Mag Hydroxide-Simeth (MAGIC MOUTHWASH) SOLN RINSE AND SPIT ONE TEASPOONFUL BY MOUTH EVERY 3 TO  4 HOURS AS NEEDED 100 mL 1  . atenolol (TENORMIN) 50 MG tablet Take 1 tablet (50 mg total) by mouth daily. 90 tablet 4  . b complex vitamins tablet Take 1 tablet by mouth daily.    . Cholecalciferol (VITAMIN  D) 2000 UNITS tablet Take 2,000 Units by mouth daily.    . cyclobenzaprine (FLEXERIL) 5 MG tablet Take 1 tablet (5 mg total) by mouth 3 (three) times daily as needed for muscle spasms. 60 tablet 1  . docusate sodium (COLACE) 100 MG capsule Take 100 mg by mouth daily.    . fexofenadine (ALLEGRA) 180 MG tablet Take 180 mg by mouth daily.    . hydrochlorothiazide (HYDRODIURIL) 25 MG tablet Take 1 tablet by mouth daily.    Marland Kitchen ketoconazole (NIZORAL) 2 % cream Apply 1 application topically daily. 50 g 0  . lansoprazole (PREVACID) 15 MG capsule Take 15 mg by mouth daily at 12 noon.    Marland Kitchen levothyroxine (SYNTHROID, LEVOTHROID) 75 MCG tablet TAKE ONE TABLET BY MOUTH ONCE DAILY 30 tablet 5  . meclizine (ANTIVERT) 25 MG tablet TAKE ONE TABLET BY MOUTH EVERY 4 TO 6 HOURS AS NEEDED 30 tablet 3  . Omega-3 Fatty Acids (FISH OIL) 1000 MG CAPS Take 1 capsule by mouth once.    Marland Kitchen PERCOCET 5-325 MG tablet Take 1-2 tablets by mouth every 4 (four) hours as needed for severe pain. 30 tablet 0  . Turmeric Curcumin 500 MG CAPS Take 1 tablet by mouth 1 day or 1 dose.     No current facility-administered medications for this encounter.    ECOG PERFORMANCE STATUS:  0 - Asymptomatic  REVIEW OF SYSTEMS:  Patient denies any weight loss, fatigue, weakness, fever, chills or night sweats. Patient denies any loss of vision, blurred vision. Patient denies any ringing  of the ears or hearing loss. No irregular heartbeat. Patient denies heart murmur or history of fainting. Patient denies any chest pain or pain radiating to her upper extremities. Patient denies any shortness of breath, difficulty breathing at night, cough or hemoptysis. Patient denies any swelling in the lower legs. Patient denies any nausea vomiting, vomiting of blood, or coffee ground material in the vomitus. Patient denies any stomach pain. Patient states has had normal bowel movements no significant constipation or diarrhea. Patient denies any dysuria, hematuria or  significant nocturia. Patient denies any problems walking, swelling in the joints or loss of balance. Patient denies any skin changes, loss of hair or loss of weight. Patient denies any excessive worrying or anxiety or significant depression. Patient denies any problems with insomnia. Patient denies excessive thirst, polyuria, polydipsia. Patient denies any swollen glands, patient denies easy bruising or easy bleeding. Patient denies any recent infections, allergies or URI. Patient "s visual fields have not changed significantly in recent time.    PHYSICAL EXAM: BP 121/78 mmHg  Pulse 41  Temp(Src) 96.3 F (35.7 C)  Resp 18  Wt 288 lb 11.1 oz (130.95 kg) Well-developed obese female status post bilateral mastectomies still with her bandages present. Well-developed well-nourished patient in NAD. HEENT reveals PERLA, EOMI, discs not visualized.  Oral cavity is clear. No oral mucosal lesions are identified. Neck is clear without evidence of cervical or supraclavicular adenopathy. Lungs are clear to A&P. Cardiac examination is essentially unremarkable with regular rate and rhythm without murmur rub or thrill. Abdomen is benign with no organomegaly or masses noted. Motor sensory and DTR levels are equal and symmetric in the upper and lower extremities. Cranial nerves  II through XII are grossly intact. Proprioception is intact. No peripheral adenopathy or edema is identified. No motor or sensory levels are noted. Crude visual fields are within normal range.  LABORATORY DATA: Pathology reports are reviewed    RADIOLOGY RESULTS: Mammograms are reviewed MRIs have been requested for my review   IMPRESSION: Stage IIB invasive lobular carcinoma the right breast status post bilateral mastectomies in 64 year old female  PLAN: Based on the patient's poor prognostic factors including large tumor size approaching 4 cm 2 out of 6 sentinel lymph nodes positive for metastatic disease I would advocate for right chest  wall and peripheral lymphatic radiation. Would treat both areas to 5000 cGy boosting her scar another 1400 cGy using electron beam. Discussion which I've had with medical oncology is whether systemic chemotherapy will be recommended and based on the mixed features of her ductal carcinoma in situ probably is a mixed element in her right breast which would probably benefit from chemotherapy. Patient will decide on that after consultation with medical oncology. I would sequence my radiation after chemotherapy if that is the treatment plan. Otherwise I would go ahead to right chest wall and peripheral lymphatic radiation. Risks and benefits of that treatment including skin reaction, fatigue, alteration of blood counts, possibility of lymphedema of her right upper extremity all were discussed in detail with the patient and her family. They all seem to comprehend my treatment plan well. Patient may seek a second opinion at [facility]. I will wait feedback from the patient regarding her option on chemotherapy.  I would like to take this opportunity for allowing me to participate in the care of your patient.Armstead Peaks., MD

## 2015-11-12 ENCOUNTER — Ambulatory Visit (INDEPENDENT_AMBULATORY_CARE_PROVIDER_SITE_OTHER): Payer: 59 | Admitting: Surgery

## 2015-11-12 ENCOUNTER — Encounter: Payer: Self-pay | Admitting: Surgery

## 2015-11-12 VITALS — BP 142/81 | HR 84 | Temp 98.5°F | Ht 66.0 in | Wt 289.0 lb

## 2015-11-12 DIAGNOSIS — C50911 Malignant neoplasm of unspecified site of right female breast: Secondary | ICD-10-CM

## 2015-11-12 NOTE — Progress Notes (Signed)
64yr old female s/p Bilateral mastectomy for Right lobular CA and left suspicious masses.  Patient doing well today, states feeling coming back around incision sites.  She states that she has had some drainage from left sided drain as well that is yellowish.  ALso some skin sloughing occurred as expected.   Filed Vitals:   11/12/15 0900  BP: 142/81  Pulse: 84  Temp: 98.5 F (36.9 C)   PE:  Gen: NAD Right breast: healing well laterally, some skin sloughing in medial aspect, no erythema or purulence, no evidence of seroma Left breast: more skin sloughing on this side, larger area of incision, serous drainage from previous drain site, no evidence of infection or seroma  A/P:  Will need to continue bilateral vaseline gauze for superficial necrolysis of skin.  Explained to patinet to expect feeling to come back and some burning sensation that she is having.  Will see her in 2-3 weeks for wound check.

## 2015-11-17 ENCOUNTER — Other Ambulatory Visit: Payer: Self-pay

## 2015-11-17 ENCOUNTER — Other Ambulatory Visit: Payer: Self-pay | Admitting: Surgery

## 2015-11-17 MED ORDER — PERCOCET 5-325 MG PO TABS: 1.0000 | ORAL_TABLET | ORAL | Status: DC | PRN

## 2015-11-17 NOTE — Progress Notes (Signed)
Erroneous

## 2015-11-17 NOTE — Telephone Encounter (Signed)
Patient would like 1 more prescription of Percocet. Please call and advise

## 2015-11-17 NOTE — Telephone Encounter (Signed)
Called patient to let her know that her pain prescription is left at the front desk. Patient understood. Patient also stated that she is not taking her pain medication as prescribed. She is taking them when she really needs them or at night time.

## 2015-11-23 NOTE — Progress Notes (Signed)
[place]  [phone] [phone]  ID: Kaylee Reyes OB: [birth year]  MR#: [id number]  [id number]  Patient Care Team: Birdie Sons, MD as PCP - General (Family Medicine) [name], MD as Consulting Physician (Gastroenterology)  CHIEF COMPLAINT:  No chief complaint on file.   INTERVAL HISTORY: Patient returns to clinic today for further evaluation, discussion of her pathology results, and treatment planning. She continues to be anxious, but otherwise feels well. She has no neurologic complaints. She denies any recent fevers or illnesses. She denies any pain. She has a good appetite and denies weight loss. She denies any chest pain or shortness of breath. She has no nausea, vomiting, constipation, or diarrhea. She has no urinary complaints. Patient offers no further specific complaints today.  REVIEW OF SYSTEMS:   Review of Systems  Constitutional: Negative for fever and malaise/fatigue.  Cardiovascular: Negative.   Gastrointestinal: Negative.   Musculoskeletal: Negative.   Neurological: Negative.  Negative for weakness.  Psychiatric/Behavioral: The patient is nervous/anxious.     As per HPI. Otherwise, a complete review of systems is negatve.  PAST MEDICAL HISTORY: Past Medical History  Diagnosis Date  . Hypertension   . GERD (gastroesophageal reflux disease)   . Thyroid disease   . Hyperlipidemia   . History of chicken pox   . Hypothyroidism   . Cancer [facility])     breast cancer    PAST SURGICAL HISTORY: Past Surgical History  Procedure Laterality Date  . Cholecystectomy  1990  . Wrist surgery    . Cesarean section  1988  . Exploratory laparotomy      Left breast  . Abdominal hysterectomy  07/13/2013    Total. with BSO for postmenopausal bleeding and fibroids with large cervical polyp. Dr. Levy Sjogren and Dr. Glennon Mac at [facility]  . Breast surgery  2011    BREAST BIOPSY  . Abdominal ultrasound  04/17/2011    Hepatomegaly  with borderline slenomegaly. Suggestive of fatty liver. s/p cholecystectomy. Portions of aorta obscured  . Mastectomy w/ sentinel node biopsy Bilateral 10/28/2015    Procedure: MASTECTOMY WITH SENTINEL LYMPH NODE BIOPSY;  Surgeon: Hubbard Robinson, MD;  Location: [facility] ORS;  Service: General;  Laterality: Bilateral;    FAMILY HISTORY Family History  Problem Relation Age of Onset  . Hypertension Mother   . Thyroid disease Mother   . Hypertension Brother   . Diabetes Brother        ADVANCED DIRECTIVES:    HEALTH MAINTENANCE: Social History  Substance Use Topics  . Smoking status: Never Smoker   . Smokeless tobacco: Never Used  . Alcohol Use: No     Colonoscopy:  PAP:  Bone density:  Lipid panel:  Allergies  Allergen Reactions  . Penicillins Shortness Of Breath  . Nickel Hives  . Pravastatin Sodium     Muscle pain, muscle cramps, muscle weakness    Current Outpatient Prescriptions  Medication Sig Dispense Refill  . Alum & Mag Hydroxide-Simeth (MAGIC MOUTHWASH) SOLN RINSE AND SPIT ONE TEASPOONFUL BY MOUTH EVERY 3 TO 4 HOURS AS NEEDED 100 mL 1  . atenolol (TENORMIN) 50 MG tablet Take 1 tablet (50 mg total) by mouth daily. 90 tablet 4  . b complex vitamins tablet Take 1 tablet by mouth daily.    . Cholecalciferol (VITAMIN D) 2000 UNITS tablet Take 2,000 Units by mouth daily.    . cyclobenzaprine (FLEXERIL) 5 MG tablet Take 1 tablet (5 mg total) by mouth 3 (three) times daily as needed  for muscle spasms. 60 tablet 1  . docusate sodium (COLACE) 100 MG capsule Take 100 mg by mouth daily.    . fexofenadine (ALLEGRA) 180 MG tablet Take 180 mg by mouth daily.    . hydrochlorothiazide (HYDRODIURIL) 25 MG tablet Take 1 tablet by mouth daily.    Marland Kitchen ketoconazole (NIZORAL) 2 % cream Apply 1 application topically daily. 50 g 0  . lansoprazole (PREVACID) 15 MG capsule Take 15 mg by mouth daily at 12 noon.    Marland Kitchen levothyroxine (SYNTHROID, LEVOTHROID) 75 MCG tablet TAKE ONE TABLET BY  MOUTH ONCE DAILY 30 tablet 5  . meclizine (ANTIVERT) 25 MG tablet TAKE ONE TABLET BY MOUTH EVERY 4 TO 6 HOURS AS NEEDED 30 tablet 3  . Omega-3 Fatty Acids (FISH OIL) 1000 MG CAPS Take 1 capsule by mouth once.    Marland Kitchen PERCOCET 5-325 MG tablet Take 1-2 tablets by mouth every 4 (four) hours as needed for severe pain. 30 tablet 0  . Turmeric Curcumin 500 MG CAPS Take 1 tablet by mouth 1 day or 1 dose.     No current facility-administered medications for this visit.    OBJECTIVE: There were no vitals filed for this visit.   There is no weight on file to calculate BMI.    ECOG FS:0 - Asymptomatic  General: Well-developed, well-nourished, no acute distress. Eyes: Pink conjunctiva, anicteric sclera. Breasts: Patient requested exam be deferred today. Lungs: Clear to auscultation bilaterally. Heart: Regular rate and rhythm. No rubs, murmurs, or gallops. Abdomen: Soft, nontender, nondistended. No organomegaly noted, normoactive bowel sounds. Musculoskeletal: No edema, cyanosis, or clubbing. Neuro: Alert, answering all questions appropriately. Cranial nerves grossly intact. Skin: No rashes or petechiae noted. Psych: Normal affect.   LAB RESULTS:  Lab Results  Component Value Date   NA 138 10/29/2015   K 4.0 10/29/2015   CL 103 10/29/2015   CO2 26 10/29/2015   GLUCOSE 133* 10/29/2015   BUN 15 10/29/2015   CREATININE 1.02* 10/29/2015   CALCIUM 8.4* 10/29/2015   AST 20 01/22/2015   ALT 25 01/22/2015   GFRNONAA 57* 10/29/2015   GFRAA >60 10/29/2015    Lab Results  Component Value Date   WBC 10.5 10/29/2015   NEUTROABS 9.7* 07/15/2013   HGB 10.6* 10/29/2015   HCT 31.5* 10/29/2015   MCV 84.7 10/29/2015   PLT 174 10/29/2015     STUDIES: Nm Sentinel Node Inj-no Rpt (breast)  10/28/2015  CLINICAL DATA: Bilateral Breast CA, will need bilateral sentinel lymph node biopsies Sulfur colloid was injected intradermally by the nuclear medicine technologist for breast cancer sentinel node  localization.    ASSESSMENT: Pathologic stage IIB ER/PR positive invasive lobular carcinoma of the right breast.  PLAN:    1. Breast cancer: Patient is now status post mastectomy. Surgical pathology results as well as PET scan results reviewed independently confirming stage IIB disease. After lengthy discussion with the patient, she has agreed to adjuvant chemotherapy using Taxotere and Cytoxan. She will also require XRT at the conclusion of her treatments. Finally, patient will benefit from an aromatase inhibitor for 5 years at the conclusion of her treatments. Return to clinic in approximately one month to initiate cycle 1 of 4 of Taxotere and Cytoxan.   Approximately 30 minutes was spent in discussion of which greater than 50% was consultation.   Patient expressed understanding and was in agreement with this plan. She also understands that She can call clinic at any time with any questions, concerns, or complaints.  Invasive lobular carcinoma of right breast, stage 2 ([place])   Staging form: Breast, AJCC 7th Edition     Pathologic stage: Stage IIB (T2, N1a, M0) - Signed by Lloyd Huger, MD on 10/22/2015   Lloyd Huger, MD   11/23/2015 9:44 AM

## 2015-11-25 ENCOUNTER — Telehealth: Payer: Self-pay

## 2015-11-25 ENCOUNTER — Telehealth: Payer: Self-pay | Admitting: Surgery

## 2015-11-25 NOTE — Telephone Encounter (Signed)
Please call patient. She had a bilateral mastectomy on 10/28/15 with Dr Azalee Course. She states there is a "horrible odor, very strong, distinct smell" that is coming from her wound/drainage. She is concerned that it may be infected. Doesn't know if she needs to come in to be seen for this or if there is anything she can do to get rid of the smell. Please call and advise, thanks.

## 2015-11-25 NOTE — Patient Instructions (Signed)

## 2015-11-25 NOTE — Telephone Encounter (Signed)
Returned phone call to patient at this time. She states that she has had a "distinct" odor since the drain has been pulled. Has a white film over breast when she showers but does not come off during shower, under left arm.  Denies fever and drainage. Appointment made to see surgeon tomorrow.

## 2015-11-25 NOTE — Telephone Encounter (Signed)
  Oncology Nurse Navigator Documentation    Navigator Encounter Type: Telephone (11/25/15 1100) Patient Visit Type: Medonc;Follow-up (11/25/15 1100) Treatment Phase: First Chemo Tx (Chemo clas) (11/25/15 1100) Barriers/Navigation Needs: Education (11/25/15 1100) Education: Preparing for Upcoming Surgery/ Treatment (11/25/15 1100) Interventions: Coordination of Care (11/25/15 1100)            Time Spent with Patient: 15 (11/25/15 1100)  Patient had questions about chemo class she will attend on 11/27/15.  Reports she has a foul odor , and drainage from mastectomy incisions.  "still draining", though drains are removed. Encouraged patient to call surgeon for immediate  assessment .  If she is unable to reach office I will call.

## 2015-11-26 ENCOUNTER — Encounter: Payer: Self-pay | Admitting: General Surgery

## 2015-11-26 ENCOUNTER — Ambulatory Visit (INDEPENDENT_AMBULATORY_CARE_PROVIDER_SITE_OTHER): Payer: 59 | Admitting: General Surgery

## 2015-11-26 VITALS — BP 145/83 | HR 80 | Temp 98.1°F | Ht 66.0 in | Wt 289.0 lb

## 2015-11-26 DIAGNOSIS — Z9013 Acquired absence of bilateral breasts and nipples: Secondary | ICD-10-CM

## 2015-11-26 MED ORDER — SILVER SULFADIAZINE 1 % EX CREA
TOPICAL_CREAM | CUTANEOUS | Status: DC
Start: 2015-11-26 — End: 2015-12-16

## 2015-11-26 MED ORDER — SULFAMETHOXAZOLE-TRIMETHOPRIM 800-160 MG PO TABS: 1.0000 | ORAL_TABLET | Freq: Two times a day (BID) | ORAL | Status: DC

## 2015-11-26 NOTE — Patient Instructions (Addendum)
You have 2 medications that have been sent to the pharmacy at this time. One is to apply to the darkened area of the wound on each side daily. The other you will take by mouth twice a day. Make sure that you have something on your stomach prior to taking this medication.  We will then see you in Clinic as planned next week with Dr. Azalee Course.  If you have any questions or concerns at all, please call our office.

## 2015-11-26 NOTE — Progress Notes (Signed)
Outpatient Surgical Follow Up  11/26/2015  Kaylee Reyes is an 64 y.o. female.   Chief Complaint  Patient presents with  . Routine Post Op    Bilateral Mastectomy (10/28/15)- Dr. Azalee Course    HPI:  64 year old female returns for follow-up of her bilateral mastectomy with. She has noticed a new smell and is afraid of the irrigating infected. She is continue daily dressing changes as best as she can. She denies any current fevers, chills, nausea, vomiting. She is mostly concerned about  The smell  Past Medical History  Diagnosis Date  . Hypertension   . GERD (gastroesophageal reflux disease)   . Thyroid disease   . Hyperlipidemia   . History of chicken pox   . Hypothyroidism   . Cancer [facility])     breast cancer    Past Surgical History  Procedure Laterality Date  . Cholecystectomy  1990  . Wrist surgery    . Cesarean section  1988  . Exploratory laparotomy      Left breast  . Abdominal hysterectomy  07/13/2013    Total. with BSO for postmenopausal bleeding and fibroids with large cervical polyp. Dr. Levy Sjogren and Dr. Glennon Mac at [facility]  . Breast surgery  2011    BREAST BIOPSY  . Abdominal ultrasound  04/17/2011    Hepatomegaly with borderline slenomegaly. Suggestive of fatty liver. s/p cholecystectomy. Portions of aorta obscured  . Mastectomy w/ sentinel node biopsy Bilateral 10/28/2015    Procedure: MASTECTOMY WITH SENTINEL LYMPH NODE BIOPSY;  Surgeon: Hubbard Robinson, MD;  Location: [facility] ORS;  Service: General;  Laterality: Bilateral;    Family History  Problem Relation Age of Onset  . Hypertension Mother   . Thyroid disease Mother   . Hypertension Brother   . Diabetes Brother     Social History:  reports that she has never smoked. She has never used smokeless tobacco. She reports that she does not drink alcohol or use illicit drugs.  Allergies:  Allergies  Allergen Reactions  . Penicillins Shortness Of Breath  . Nickel Hives  . Pravastatin Sodium     Muscle  pain, muscle cramps, muscle weakness    Medications reviewed.    ROS  multipoint review of systems was completed, all pertinent positives and negatives are documented in the history of present illness and remainder negative.   BP 145/83 mmHg  Pulse 80  Temp(Src) 98.1 F (36.7 C) (Oral)  Ht 5\' 6"  (1.676 m)  Wt 131.09 kg (289 lb)  BMI 46.67 kg/m2  Physical Exam   Gen.: No acute distress Chest: Clear to auscultation Breast: Bilateral mastectomy  Incisions with evidence of eschar , wound breakdown , spreading erythema. There is a soupy drainage coming from the eschar on bilateral breasts. No palpable fluctuance and the wounds are open bilaterally with active drainage.  Heart: Regular rate and rhythm Abdomen: Very large, soft, nontender   No results found for this or any previous visit (from the past 48 hour(s)). No results found.  Assessment/Plan:  1. S/P bilateral mastectomy  64 year old female with wound disruption the bilateral mastectomy sites. There is evidence of infection and bilateral sites. We'll prescribe oral antibiotics with Bactrim and topical antibiotics with Silvadene. Instructed patient on how to use the Silvadene in clinic today. She will continue local wound care at home. Return to clinic in 1 week for additional follow-up. Should the area progressed she may need further debridement and possible wound VAC placement for continued healing.     Clayburn Pert,  MD FACS General Surgeon  11/26/2015,3:59 PM

## 2015-11-27 ENCOUNTER — Inpatient Hospital Stay: Payer: 59

## 2015-12-03 ENCOUNTER — Ambulatory Visit (INDEPENDENT_AMBULATORY_CARE_PROVIDER_SITE_OTHER): Payer: 59 | Admitting: Surgery

## 2015-12-03 ENCOUNTER — Encounter: Payer: Self-pay | Admitting: Surgery

## 2015-12-03 VITALS — BP 146/85 | HR 90 | Temp 97.8°F | Wt 284.0 lb

## 2015-12-03 DIAGNOSIS — C50911 Malignant neoplasm of unspecified site of right female breast: Secondary | ICD-10-CM

## 2015-12-03 DIAGNOSIS — Z9013 Acquired absence of bilateral breasts and nipples: Secondary | ICD-10-CM

## 2015-12-03 NOTE — Patient Instructions (Signed)
Continue dressings with silvadene and vaseline gauze

## 2015-12-03 NOTE — Progress Notes (Signed)
64yr old female s/p Bilateral mastectomy for Right lobular CA and left suspicious masses.  Patient with superficial necrolysis of both mastectomy sites.  She was seen by my partner Dr. Adonis Huguenin last week and given Bactrim and Silvadene.   Patient states feeling better, odor has resolved as well.  She states she sometimes gets chills but no fevers.   Filed Vitals:   12/03/15 1500  BP: 146/85  Pulse: 90  Temp: 97.8 F (36.6 C)   PE:  Gen: NAD Chest wall:  Right sided mastectomy site, residual eschar and fibrinous material removed manually, good granulation tissue seen, most of it is all flush with chest wall at this time Left sided mastectomy site: eschar and fibrinous material removed from majority of wound, good granulation tissue beneath, lateral area about 4cm in size that is about 60mm deep, otherwise ganulation tissue flush with skin, no erythema or signs of infection  A/P:  Debrided the eschar tissue way today in clinic, healing well, no sx of infection today, continue Vaseline gauze and silvadene.  Will have her RTC in 1 week to ensure no infection before scheduling port placement

## 2015-12-08 ENCOUNTER — Encounter: Payer: Self-pay | Admitting: Oncology

## 2015-12-10 ENCOUNTER — Ambulatory Visit (INDEPENDENT_AMBULATORY_CARE_PROVIDER_SITE_OTHER): Payer: BLUE CROSS/BLUE SHIELD | Admitting: Surgery

## 2015-12-10 ENCOUNTER — Other Ambulatory Visit: Payer: Self-pay | Admitting: *Deleted

## 2015-12-10 ENCOUNTER — Encounter: Payer: Self-pay | Admitting: Surgery

## 2015-12-10 VITALS — BP 157/97 | HR 79 | Temp 98.7°F | Wt 285.0 lb

## 2015-12-10 DIAGNOSIS — C50911 Malignant neoplasm of unspecified site of right female breast: Secondary | ICD-10-CM

## 2015-12-10 MED ORDER — LIDOCAINE-PRILOCAINE 2.5-2.5 % EX CREA: 1.0000 "application " | TOPICAL_CREAM | Freq: Once | CUTANEOUS | Status: DC

## 2015-12-10 NOTE — Patient Instructions (Signed)
Please keep your wound dry. Do not apply any type of cream. We will be contacting you with the exact date in reference to your port-incision. Please give [place] a call if you have any questions or concerns.

## 2015-12-10 NOTE — Progress Notes (Signed)
Subjective:     Patient ID: Kaylee Reyes, female   DOB: 04/01/1951, 65 y.o.   MRN: [id number]  HPI 65yr old female s/p Bilateral mastectomy for Right lobular CA and left suspicious masses. Patient with superficial necrolysis of bilateral mastectomy sites.Patient states doing well, still some drainage but all redness gone.    Past Medical History  Diagnosis Date  . Hypertension   . GERD (gastroesophageal reflux disease)   . Thyroid disease   . Hyperlipidemia   . History of chicken pox   . Hypothyroidism   . Cancer [facility])     breast cancer   Past Surgical History  Procedure Laterality Date  . Cholecystectomy  1990  . Wrist surgery    . Cesarean section  1988  . Exploratory laparotomy      Left breast  . Abdominal hysterectomy  07/13/2013    Total. with BSO for postmenopausal bleeding and fibroids with large cervical polyp. Dr. Levy Sjogren and Dr. Glennon Mac at [facility]  . Breast surgery  2011    BREAST BIOPSY  . Abdominal ultrasound  04/17/2011    Hepatomegaly with borderline slenomegaly. Suggestive of fatty liver. s/p cholecystectomy. Portions of aorta obscured  . Mastectomy w/ sentinel node biopsy Bilateral 10/28/2015    Procedure: MASTECTOMY WITH SENTINEL LYMPH NODE BIOPSY;  Surgeon: Hubbard Robinson, MD;  Location: [facility] ORS;  Service: General;  Laterality: Bilateral;   Family History  Problem Relation Age of Onset  . Hypertension Mother   . Thyroid disease Mother   . Hypertension Brother   . Diabetes Brother    Social History   Social History  . Marital Status: Married    Spouse Name: N/A  . Number of Children: 2  . Years of Education: N/A   Social History Main Topics  . Smoking status: Never Smoker   . Smokeless tobacco: Never Used  . Alcohol Use: No  . Drug Use: No  . Sexual Activity: No   Other Topics Concern  . None   Social History Narrative    Current outpatient prescriptions:  .  Alum & Mag Hydroxide-Simeth (MAGIC MOUTHWASH) SOLN, RINSE AND SPIT ONE  TEASPOONFUL BY MOUTH EVERY 3 TO 4 HOURS AS NEEDED, Disp: 100 mL, Rfl: 1 .  atenolol (TENORMIN) 50 MG tablet, Take 1 tablet (50 mg total) by mouth daily., Disp: 90 tablet, Rfl: 4 .  b complex vitamins tablet, Take 1 tablet by mouth daily., Disp: , Rfl:  .  Cholecalciferol (VITAMIN D) 2000 UNITS tablet, Take 2,000 Units by mouth daily., Disp: , Rfl:  .  cyclobenzaprine (FLEXERIL) 5 MG tablet, Take 1 tablet (5 mg total) by mouth 3 (three) times daily as needed for muscle spasms., Disp: 60 tablet, Rfl: 1 .  docusate sodium (COLACE) 100 MG capsule, Take 100 mg by mouth daily., Disp: , Rfl:  .  fexofenadine (ALLEGRA) 180 MG tablet, Take 180 mg by mouth daily., Disp: , Rfl:  .  hydrochlorothiazide (HYDRODIURIL) 25 MG tablet, Take 1 tablet by mouth daily., Disp: , Rfl:  .  ketoconazole (NIZORAL) 2 % cream, Apply 1 application topically daily., Disp: 50 g, Rfl: 0 .  lansoprazole (PREVACID) 15 MG capsule, Take 15 mg by mouth daily at 12 noon., Disp: , Rfl:  .  levothyroxine (SYNTHROID, LEVOTHROID) 75 MCG tablet, TAKE ONE TABLET BY MOUTH ONCE DAILY, Disp: 30 tablet, Rfl: 5 .  meclizine (ANTIVERT) 25 MG tablet, TAKE ONE TABLET BY MOUTH EVERY 4 TO 6 HOURS AS NEEDED, Disp: 30 tablet, Rfl:  3 .  Omega-3 Fatty Acids (FISH OIL) 1000 MG CAPS, Take 1 capsule by mouth once., Disp: , Rfl:  .  PERCOCET 5-325 MG tablet, Take 1-2 tablets by mouth every 4 (four) hours as needed for severe pain., Disp: 30 tablet, Rfl: 0 .  silver sulfADIAZINE (SILVADENE) 1 % cream, Apply to affected area daily, Disp: 400 g, Rfl: 1 .  silver sulfADIAZINE (SILVADENE) 1 % cream, Apply 1 application topically., Disp: , Rfl:  .  sulfamethoxazole-trimethoprim (BACTRIM DS,SEPTRA DS) 800-160 MG tablet, Take 1 tablet by mouth 2 (two) times daily., Disp: 14 tablet, Rfl: 0 .  Turmeric Curcumin 500 MG CAPS, Take 1 tablet by mouth 1 day or 1 dose., Disp: , Rfl:  Allergies  Allergen Reactions  . Penicillins Shortness Of Breath  . Nickel Hives  .  Pravastatin Sodium     Muscle pain, muscle cramps, muscle weakness    Filed Vitals:   12/10/15 1401  BP: 157/97  Pulse: 79  Temp: 98.7 F (37.1 C)      Review of Systems  Constitutional: Positive for chills. Negative for fever, activity change, appetite change, fatigue and unexpected weight change.  HENT: Negative for sore throat.   Respiratory: Negative for cough, choking, shortness of breath and wheezing.   Cardiovascular: Negative for chest pain, palpitations and leg swelling.  Gastrointestinal: Negative for abdominal pain, diarrhea and constipation.  Genitourinary: Negative for dysuria and hematuria.  Musculoskeletal: Negative for back pain and joint swelling.  Hematological: Negative for adenopathy. Does not bruise/bleed easily.  Psychiatric/Behavioral: Negative for agitation. The patient is nervous/anxious.        Objective:   Physical Exam  Constitutional: She is oriented to person, place, and time. She appears well-developed and well-nourished. No distress.  HENT:  Head: Normocephalic and atraumatic.  Right Ear: External ear normal.  Left Ear: External ear normal.  Nose: Nose normal.  Mouth/Throat: Oropharynx is clear and moist.  Eyes: Conjunctivae are normal. Pupils are equal, round, and reactive to light. No scleral icterus.  Neck: Normal range of motion. Neck supple. No tracheal deviation present.  Cardiovascular: Normal rate, regular rhythm, normal heart sounds and intact distal pulses.  Exam reveals no gallop and no friction rub.   No murmur heard. Pulmonary/Chest: Effort normal and breath sounds normal. No respiratory distress. She has no wheezes.  Abdominal: Soft. Bowel sounds are normal. She exhibits no distension. There is no tenderness. There is no rebound.  Musculoskeletal: Normal range of motion. She exhibits no edema.  Lymphadenopathy:    She has no cervical adenopathy.  Neurological: She is alert and oriented to person, place, and time. No cranial  nerve deficit.  Skin: Skin is warm and dry. No rash noted. No erythema.  Left mastectomy site: 5cm area granulation tissue flush with skin except for a 1 cm area laterally, silver nitrate applied  Right mastectomy site: 3cm area of granulation tissue all flush with skin, silver nitrate applied.   Psychiatric: She has a normal mood and affect. Her behavior is normal. Judgment and thought content normal.  Vitals reviewed.      Assessment:     65 yr old with right lobular breast cancer, s/p bilateral mastectomy    Plan:     Mastectomy wounds:  Healing well, silver nitrate applied to decrease drainage, changed to just dry dressings   Chemotherapy to begin soon, needs port placement. I discussed the risks, benefits, alternatives and expected outcomes with the patient including bleeding, infection, pneumothorax, possible need for chest  tube placement, possible need for hospital observation, possible need for removal of port if not working properly or in the case of infection. Patient understands and agrees to have port placement hopefully Jan 16 or 17th.

## 2015-12-11 ENCOUNTER — Inpatient Hospital Stay: Payer: BLUE CROSS/BLUE SHIELD | Admitting: Oncology

## 2015-12-11 ENCOUNTER — Telehealth: Payer: Self-pay | Admitting: Surgery

## 2015-12-11 ENCOUNTER — Other Ambulatory Visit: Payer: Self-pay | Admitting: Family Medicine

## 2015-12-11 ENCOUNTER — Inpatient Hospital Stay: Payer: BLUE CROSS/BLUE SHIELD

## 2015-12-11 ENCOUNTER — Other Ambulatory Visit: Payer: 59

## 2015-12-11 NOTE — Telephone Encounter (Signed)
Pt contacted office for refill request on the following medications:  Alum & Mag Hydroxide-Simeth (MAGIC MOUTHWASH) SOLN.  Pt is requesting refills if possible.  [facility] [place].  [phone]

## 2015-12-11 NOTE — Telephone Encounter (Signed)
Pt advised of pre op date/time and sx date. Sx: 12/23/15 with Dr Lina Sayre Placement. Pre op: 12/16/15 between 1-5:00pm--Phone.

## 2015-12-12 MED ORDER — MAGIC MOUTHWASH: ORAL | Status: DC

## 2015-12-16 ENCOUNTER — Encounter: Payer: Self-pay | Admitting: *Deleted

## 2015-12-16 ENCOUNTER — Inpatient Hospital Stay: Admission: RE | Admit: 2015-12-16 | Payer: BLUE CROSS/BLUE SHIELD | Source: Ambulatory Visit

## 2015-12-16 NOTE — Telephone Encounter (Signed)
Rx called in to pharmacy. 

## 2015-12-16 NOTE — Patient Instructions (Signed)
  Your procedure is scheduled on: 12/23/15 Report to Day Surgery. To find out your arrival time please call [phone] between 1PM - 3PM on 12/22/15.  Remember: Instructions that are not followed completely may result in serious medical risk, up to and including death, or upon the discretion of your surgeon and anesthesiologist your surgery may need to be rescheduled.    _x___ 1. Do not eat food or drink liquids after midnight. No gum chewing or hard candies.     ___x_ 2. No Alcohol for 24 hours before or after surgery.   ____ 3. Bring all medications with you on the day of surgery if instructed.    __x__ 4. Notify your doctor if there is any change in your medical condition     (cold, fever, infections).     Do not wear jewelry, make-up, hairpins, clips or nail polish.  Do not wear lotions, powders, or perfumes. You may wear deodorant.  Do not shave 48 hours prior to surgery. Men may shave face and neck.  Do not bring valuables to the hospital.    [facility] is not responsible for any belongings or valuables.               Contacts, dentures or bridgework may not be worn into surgery.  Leave your suitcase in the car. After surgery it may be brought to your room.  For patients admitted to the hospital, discharge time is determined by your                treatment team.   Patients discharged the day of surgery will not be allowed to drive home.   Please read over the following fact sheets that you were given:      __x__ Take these medicines the morning of surgery with A SIP OF WATER:    1.Prevacid the night before surgery and morning of surgery  2. atenolol  3. levothyroxine  4.percocet if needed  5.  6.  ____ Fleet Enema (as directed)   __x__ Use CHG Soap as directed if able to pick up at PAT  ____ Use inhalers on the day of surgery  ____ Stop metformin 2 days prior to surgery    ____ Take 1/2 of usual insulin dose the night before surgery and none on the morning of  surgery.   ____ Stop Coumadin/Plavix/aspirin on   ____ Stop Anti-inflammatories on percocet or tylenol only   __x__ Stop supplements until after surgery.    ____ Bring C-Pap to the hospital.

## 2015-12-18 ENCOUNTER — Encounter: Payer: Self-pay | Admitting: General Surgery

## 2015-12-18 ENCOUNTER — Ambulatory Visit (INDEPENDENT_AMBULATORY_CARE_PROVIDER_SITE_OTHER): Payer: BLUE CROSS/BLUE SHIELD | Admitting: General Surgery

## 2015-12-18 ENCOUNTER — Telehealth: Payer: Self-pay

## 2015-12-18 VITALS — BP 138/86 | HR 85 | Temp 98.9°F | Ht 66.0 in | Wt 285.0 lb

## 2015-12-18 DIAGNOSIS — Z9013 Acquired absence of bilateral breasts and nipples: Secondary | ICD-10-CM

## 2015-12-18 NOTE — Patient Instructions (Signed)
We have seen you today for decreased range of motion in your left arm. This is due to scar tissue growing in to help your breast heal from surgery.  Make sure you complete your arm exercises that you were shown today.  1. Walk your fingers up the wall and walk your body into the wall until you obtain complete range of motion back. If you are having difficulty walking up the wall, you may squat down to get the range of motion back. Hold for 30 seconds to 1  minute, then repeat 5 times.  2. Place your hand on the wall and twist your body away from your hand until range of motion is complete. Hold for 30 seconds to a minute and then repeat 5 times.  You will want to do these exercises at least twice daily.  Dr. Azalee Course will follow-up with you next week for your port. If you have ANY other questions or concerns please call and speak with a nurse.

## 2015-12-18 NOTE — Pre-Procedure Instructions (Signed)
Patient instructed on use of CHG soap the night before and morning of surgery

## 2015-12-18 NOTE — Progress Notes (Signed)
Outpatient Surgical Follow Up  12/18/2015  Kaylee Reyes is an 65 y.o. female.   Chief Complaint  Patient presents with  . Post-op Problem    Bilateral Mastectomy (10/28/15)- Dr. Azalee Course    HPI:  64 year old female well-known the surgery department who is 6 weeks status post bilateral mastectomy returns to clinic due to concerns about a new area of constriction to her left surgical site. Patient states over the last few days she's had a hard area on the most lateral aspect of her left mastectomy incision which has started to decrease the mobility of her left shoulder. Patient states she has not been taught any exercises to maintain her mobility this is new to her. She denies any fevers, chills, nausea, vomiting, diarrhea, constipation. She states her wounds are continuing to improve daily. Patient is due to have her Chemo-Port placed in the next week.  Past Medical History  Diagnosis Date  . Hypertension   . GERD (gastroesophageal reflux disease)   . Thyroid disease   . Hyperlipidemia   . History of chicken pox   . Hypothyroidism   . Cancer [facility])     breast cancer    Past Surgical History  Procedure Laterality Date  . Cholecystectomy  1990  . Wrist surgery    . Cesarean section  1988  . Exploratory laparotomy      Left breast  . Abdominal hysterectomy  07/13/2013    Total. with BSO for postmenopausal bleeding and fibroids with large cervical polyp. Dr. [facility]  . Breast surgery  2011    BREAST BIOPSY  . Abdominal ultrasound  04/17/2011    Hepatomegaly with borderline slenomegaly. Suggestive of fatty liver. s/p cholecystectomy. Portions of aorta obscured  . Mastectomy w/ sentinel node biopsy Bilateral 10/28/2015    Procedure: MASTECTOMY WITH SENTINEL LYMPH NODE BIOPSY;  Surgeon: Hubbard Robinson, MD;  Location: [facility] ORS;  Service: General;  Laterality: Bilateral;    Family History  Problem Relation Age of Onset  . Hypertension Mother   . Thyroid  disease Mother   . Hypertension Brother   . Diabetes Brother     Social History:  reports that she has never smoked. She has never used smokeless tobacco. She reports that she does not drink alcohol or use illicit drugs.  Allergies:  Allergies  Allergen Reactions  . Penicillins Shortness Of Breath  . Nickel Hives  . Pravastatin Sodium     Muscle pain, muscle cramps, muscle weakness    Medications reviewed.    ROS  a multipoint review of systems was completed. All pertinent positives and negatives are document in history of present illness the remainder negative.   BP 138/86 mmHg  Pulse 85  Temp(Src) 98.9 F (37.2 C) (Oral)  Ht 5\' 6"  (1.676 m)  Wt 129.275 kg (285 lb)  BMI 46.02 kg/m2  Physical Exam  Gen.: No acute distress  chest: Clear to auscultation Heart: Regular in rhythm Breast: Bilateral mastectomy incision sites with some openings of the skin bilaterally without any spreading erythema or purulent drainage. The left lateral aspect patient is of concern of is an area of scar where her wound is starting to heal together on the lateral aspects where she had an axillary dissection. No evidence of new mass.    No results found for this or any previous visit (from the past 48 hour(s)). No results found.  Assessment/Plan:  1. S/P bilateral mastectomy  65 year old female status post bilateral mastectomies. Discussed  that this is a result of continued healing from the wound. Provided the patient with multiple exercises to regain the mobility of her shoulder. Discharged her that should the mobility not prove within send her for formal physical therapy. Patient will continue with the plan for chemotherapy port placement next week with Dr. Azalee Course.     Clayburn Pert, MD FACS General Surgeon  12/18/2015,2:47 PM

## 2015-12-18 NOTE — Telephone Encounter (Signed)
Patient calls in and states that she has a hardened area that has come up in her left axillary area that is not associated with any of the incisions but is extremely painful. Denies drainage, redness, or fever. Patient would like this looked at by a surgeon prior to scheduled port placement on 1/17.

## 2015-12-23 ENCOUNTER — Ambulatory Visit: Payer: BLUE CROSS/BLUE SHIELD

## 2015-12-23 ENCOUNTER — Ambulatory Visit: Payer: BLUE CROSS/BLUE SHIELD | Admitting: Anesthesiology

## 2015-12-23 ENCOUNTER — Encounter
Admission: RE | Disposition: A | Discharge status: Home / Self Care | Payer: Self-pay | Source: Ambulatory Visit | Attending: Surgery

## 2015-12-23 ENCOUNTER — Ambulatory Visit
Admission: RE | Admit: 2015-12-23 | Discharge: 2015-12-23 | Disposition: A | Discharge status: Home / Self Care | Payer: BLUE CROSS/BLUE SHIELD | Source: Ambulatory Visit | Attending: Surgery | Admitting: Surgery

## 2015-12-23 ENCOUNTER — Encounter: Payer: Self-pay | Admitting: *Deleted

## 2015-12-23 DIAGNOSIS — Z95828 Presence of other vascular implants and grafts: Secondary | ICD-10-CM

## 2015-12-23 DIAGNOSIS — C50912 Malignant neoplasm of unspecified site of left female breast: Secondary | ICD-10-CM | POA: Insufficient documentation

## 2015-12-23 HISTORY — PX: PORTACATH PLACEMENT: SHX2246

## 2015-12-23 LAB — POCT I-STAT 4, (NA,K, GLUC, HGB,HCT)
Glucose, Bld: 109 mg/dL — ABNORMAL HIGH (ref 65–99)
HCT: 36 % (ref 36.0–46.0)
Hemoglobin: 12.2 g/dL (ref 12.0–15.0)
Potassium: 3.8 mmol/L (ref 3.5–5.1)
Sodium: 140 mmol/L (ref 135–145)

## 2015-12-23 SURGERY — INSERTION, TUNNELED CENTRAL VENOUS DEVICE, WITH PORT
Anesthesia: General | Laterality: Left | Wound class: Clean

## 2015-12-23 MED ORDER — LACTATED RINGERS IV SOLN
INTRAVENOUS | Status: DC
  Administered 2015-12-23: 10:00:00 via INTRAVENOUS

## 2015-12-23 MED ORDER — ONDANSETRON HCL 4 MG/2ML IJ SOLN: 4.0000 mg | Freq: Once | INTRAMUSCULAR | Status: DC | PRN

## 2015-12-23 MED ORDER — CIPROFLOXACIN IN D5W 400 MG/200ML IV SOLN
INTRAVENOUS | Status: AC
  Administered 2015-12-23: 400 mg via INTRAVENOUS
  Filled 2015-12-23: qty 200

## 2015-12-23 MED ORDER — BUPIVACAINE HCL (PF) 0.5 % IJ SOLN
INTRAMUSCULAR | Status: DC | PRN
  Administered 2015-12-23: 20 mL

## 2015-12-23 MED ORDER — FENTANYL CITRATE (PF) 100 MCG/2ML IJ SOLN
INTRAMUSCULAR | Status: DC | PRN
  Administered 2015-12-23: 25 ug via INTRAVENOUS
  Administered 2015-12-23: 50 ug via INTRAVENOUS
  Administered 2015-12-23: 25 ug via INTRAVENOUS

## 2015-12-23 MED ORDER — MIDAZOLAM HCL 2 MG/2ML IJ SOLN
INTRAMUSCULAR | Status: DC | PRN
  Administered 2015-12-23: 2 mg via INTRAVENOUS

## 2015-12-23 MED ORDER — SODIUM CHLORIDE 0.9 % IJ SOLN
INTRAMUSCULAR | Status: AC
  Filled 2015-12-23: qty 50

## 2015-12-23 MED ORDER — ONDANSETRON HCL 4 MG/2ML IJ SOLN
INTRAMUSCULAR | Status: DC | PRN
Start: 2015-12-23 — End: 2015-12-23
  Administered 2015-12-23: 4 mg via INTRAVENOUS

## 2015-12-23 MED ORDER — FENTANYL CITRATE (PF) 100 MCG/2ML IJ SOLN: 25.0000 ug | INTRAMUSCULAR | Status: DC | PRN

## 2015-12-23 MED ORDER — CHLORHEXIDINE GLUCONATE 4 % EX LIQD: 1.0000 "application " | Freq: Once | CUTANEOUS | Status: DC

## 2015-12-23 MED ORDER — DEXAMETHASONE SODIUM PHOSPHATE 4 MG/ML IJ SOLN
INTRAMUSCULAR | Status: DC | PRN
  Administered 2015-12-23: 5 mg via INTRAVENOUS

## 2015-12-23 MED ORDER — BUPIVACAINE HCL (PF) 0.5 % IJ SOLN
INTRAMUSCULAR | Status: AC
  Filled 2015-12-23: qty 30

## 2015-12-23 MED ORDER — PERCOCET 5-325 MG PO TABS: 1.0000 | ORAL_TABLET | ORAL | Status: DC | PRN

## 2015-12-23 MED ORDER — LIDOCAINE HCL (CARDIAC) 20 MG/ML IV SOLN
INTRAVENOUS | Status: DC | PRN
  Administered 2015-12-23: 67 mg via INTRAVENOUS

## 2015-12-23 MED ORDER — PROPOFOL 10 MG/ML IV BOLUS
INTRAVENOUS | Status: DC | PRN
  Administered 2015-12-23: 160 mg via INTRAVENOUS

## 2015-12-23 MED ORDER — CIPROFLOXACIN IN D5W 400 MG/200ML IV SOLN: 400.0000 mg | INTRAVENOUS | Status: DC

## 2015-12-23 MED ORDER — GLYCOPYRROLATE 0.2 MG/ML IJ SOLN
INTRAMUSCULAR | Status: DC | PRN
  Administered 2015-12-23: 0.2 mg via INTRAVENOUS

## 2015-12-23 MED ORDER — HEPARIN SODIUM (PORCINE) 5000 UNIT/ML IJ SOLN
INTRAMUSCULAR | Status: AC
  Filled 2015-12-23: qty 1

## 2015-12-23 MED ORDER — SODIUM CHLORIDE 0.9 % IV SOLN
INTRAVENOUS | Status: DC | PRN
  Administered 2015-12-23: 20 mL via INTRAMUSCULAR

## 2015-12-23 SURGICAL SUPPLY — 30 items
BLADE SURG SZ11 CARB STEEL (BLADE) ×3 IMPLANT
CANISTER SUCT 1200ML W/VALVE (MISCELLANEOUS) ×3 IMPLANT
CHLORAPREP W/TINT 26ML (MISCELLANEOUS) ×3 IMPLANT
COVER LIGHT HANDLE STERIS (MISCELLANEOUS) ×6 IMPLANT
DECANTER SPIKE VIAL GLASS SM (MISCELLANEOUS) ×6 IMPLANT
DRAPE C-ARM XRAY 36X54 (DRAPES) ×3 IMPLANT
ELECT REM PT RETURN 9FT ADLT (ELECTROSURGICAL) ×3
ELECTRODE REM PT RTRN 9FT ADLT (ELECTROSURGICAL) ×1 IMPLANT
GLOVE BIOGEL M 7.0 STRL (GLOVE) ×4 IMPLANT
GLOVE EXAM NITRILE PF MED BLUE (GLOVE) ×4 IMPLANT
GLOVE PI ORTHOPRO 6.5 (GLOVE) ×2
GLOVE PI ORTHOPRO STRL 6.5 (GLOVE) ×1 IMPLANT
GOWN STRL REUS W/ TWL LRG LVL3 (GOWN DISPOSABLE) ×2 IMPLANT
GOWN STRL REUS W/TWL LRG LVL3 (GOWN DISPOSABLE) ×9
KIT PORT POWER 8FR ISP CVUE (Catheter) ×3 IMPLANT
KIT RM TURNOVER STRD PROC AR (KITS) ×3 IMPLANT
LABEL OR SOLS (LABEL) ×3 IMPLANT
LIQUID BAND (GAUZE/BANDAGES/DRESSINGS) ×3 IMPLANT
NDL FILTER BLUNT 18X1 1/2 (NEEDLE) ×1 IMPLANT
NEEDLE FILTER BLUNT 18X 1/2SAF (NEEDLE) ×2
NEEDLE FILTER BLUNT 18X1 1/2 (NEEDLE) ×1 IMPLANT
PACK PORT-A-CATH (MISCELLANEOUS) ×3 IMPLANT
SUT MNCRL 4-0 (SUTURE) ×3
SUT MNCRL 4-0 27XMFL (SUTURE) ×1
SUT PROLENE 3 0 SH DA (SUTURE) ×3 IMPLANT
SUT VIC AB 3-0 SH 27 (SUTURE) ×3
SUT VIC AB 3-0 SH 27X BRD (SUTURE) ×1 IMPLANT
SUTURE MNCRL 4-0 27XMF (SUTURE) ×1 IMPLANT
SYR 3ML LL SCALE MARK (SYRINGE) ×3 IMPLANT
SYRINGE 10CC LL (SYRINGE) ×3 IMPLANT

## 2015-12-23 NOTE — Discharge Instructions (Signed)

## 2015-12-23 NOTE — Progress Notes (Signed)
Dr. Azalee Course at bedside , discussed IV placement , okay to place IV on right arm

## 2015-12-23 NOTE — OR Nursing (Signed)
Dr Azalee Course in to see pt to discuss results of chest xray. Dr Azalee Course states no concern over chest xray. States pt ready to go home.

## 2015-12-23 NOTE — H&P (View-Only) (Signed)
Subjective:     Patient ID: Kaylee Reyes, female   DOB: Jul 06, 1951, 65 y.o.   MRN: [id number]  HPI 65yr old female s/p Bilateral mastectomy for Right lobular CA and left suspicious masses. Patient with superficial necrolysis of bilateral mastectomy sites.Patient states doing well, still some drainage but all redness gone.    Past Medical History  Diagnosis Date  . Hypertension   . GERD (gastroesophageal reflux disease)   . Thyroid disease   . Hyperlipidemia   . History of chicken pox   . Hypothyroidism   . Cancer [facility])     breast cancer   Past Surgical History  Procedure Laterality Date  . Cholecystectomy  1990  . Wrist surgery    . Cesarean section  1988  . Exploratory laparotomy      Left breast  . Abdominal hysterectomy  07/13/2013    Total. with BSO for postmenopausal bleeding and fibroids with large cervical polyp. Dr. Levy Sjogren and Dr. Glennon Mac at [facility]  . Breast surgery  2011    BREAST BIOPSY  . Abdominal ultrasound  04/17/2011    Hepatomegaly with borderline slenomegaly. Suggestive of fatty liver. s/p cholecystectomy. Portions of aorta obscured  . Mastectomy w/ sentinel node biopsy Bilateral 10/28/2015    Procedure: MASTECTOMY WITH SENTINEL LYMPH NODE BIOPSY;  Surgeon: Hubbard Robinson, MD;  Location: [facility] ORS;  Service: General;  Laterality: Bilateral;   Family History  Problem Relation Age of Onset  . Hypertension Mother   . Thyroid disease Mother   . Hypertension Brother   . Diabetes Brother    Social History   Social History  . Marital Status: Married    Spouse Name: N/A  . Number of Children: 2  . Years of Education: N/A   Social History Main Topics  . Smoking status: Never Smoker   . Smokeless tobacco: Never Used  . Alcohol Use: No  . Drug Use: No  . Sexual Activity: No   Other Topics Concern  . None   Social History Narrative    Current outpatient prescriptions:  .  Alum & Mag Hydroxide-Simeth (MAGIC MOUTHWASH) SOLN, RINSE AND SPIT ONE  TEASPOONFUL BY MOUTH EVERY 3 TO 4 HOURS AS NEEDED, Disp: 100 mL, Rfl: 1 .  atenolol (TENORMIN) 50 MG tablet, Take 1 tablet (50 mg total) by mouth daily., Disp: 90 tablet, Rfl: 4 .  b complex vitamins tablet, Take 1 tablet by mouth daily., Disp: , Rfl:  .  Cholecalciferol (VITAMIN D) 2000 UNITS tablet, Take 2,000 Units by mouth daily., Disp: , Rfl:  .  cyclobenzaprine (FLEXERIL) 5 MG tablet, Take 1 tablet (5 mg total) by mouth 3 (three) times daily as needed for muscle spasms., Disp: 60 tablet, Rfl: 1 .  docusate sodium (COLACE) 100 MG capsule, Take 100 mg by mouth daily., Disp: , Rfl:  .  fexofenadine (ALLEGRA) 180 MG tablet, Take 180 mg by mouth daily., Disp: , Rfl:  .  hydrochlorothiazide (HYDRODIURIL) 25 MG tablet, Take 1 tablet by mouth daily., Disp: , Rfl:  .  ketoconazole (NIZORAL) 2 % cream, Apply 1 application topically daily., Disp: 50 g, Rfl: 0 .  lansoprazole (PREVACID) 15 MG capsule, Take 15 mg by mouth daily at 12 noon., Disp: , Rfl:  .  levothyroxine (SYNTHROID, LEVOTHROID) 75 MCG tablet, TAKE ONE TABLET BY MOUTH ONCE DAILY, Disp: 30 tablet, Rfl: 5 .  meclizine (ANTIVERT) 25 MG tablet, TAKE ONE TABLET BY MOUTH EVERY 4 TO 6 HOURS AS NEEDED, Disp: 30 tablet, Rfl:  3 .  Omega-3 Fatty Acids (FISH OIL) 1000 MG CAPS, Take 1 capsule by mouth once., Disp: , Rfl:  .  PERCOCET 5-325 MG tablet, Take 1-2 tablets by mouth every 4 (four) hours as needed for severe pain., Disp: 30 tablet, Rfl: 0 .  silver sulfADIAZINE (SILVADENE) 1 % cream, Apply to affected area daily, Disp: 400 g, Rfl: 1 .  silver sulfADIAZINE (SILVADENE) 1 % cream, Apply 1 application topically., Disp: , Rfl:  .  sulfamethoxazole-trimethoprim (BACTRIM DS,SEPTRA DS) 800-160 MG tablet, Take 1 tablet by mouth 2 (two) times daily., Disp: 14 tablet, Rfl: 0 .  Turmeric Curcumin 500 MG CAPS, Take 1 tablet by mouth 1 day or 1 dose., Disp: , Rfl:  Allergies  Allergen Reactions  . Penicillins Shortness Of Breath  . Nickel Hives  .  Pravastatin Sodium     Muscle pain, muscle cramps, muscle weakness    Filed Vitals:   12/10/15 1401  BP: 157/97  Pulse: 79  Temp: 98.7 F (37.1 C)      Review of Systems  Constitutional: Positive for chills. Negative for fever, activity change, appetite change, fatigue and unexpected weight change.  HENT: Negative for sore throat.   Respiratory: Negative for cough, choking, shortness of breath and wheezing.   Cardiovascular: Negative for chest pain, palpitations and leg swelling.  Gastrointestinal: Negative for abdominal pain, diarrhea and constipation.  Genitourinary: Negative for dysuria and hematuria.  Musculoskeletal: Negative for back pain and joint swelling.  Hematological: Negative for adenopathy. Does not bruise/bleed easily.  Psychiatric/Behavioral: Negative for agitation. The patient is nervous/anxious.        Objective:   Physical Exam  Constitutional: She is oriented to person, place, and time. She appears well-developed and well-nourished. No distress.  HENT:  Head: Normocephalic and atraumatic.  Right Ear: External ear normal.  Left Ear: External ear normal.  Nose: Nose normal.  Mouth/Throat: Oropharynx is clear and moist.  Eyes: Conjunctivae are normal. Pupils are equal, round, and reactive to light. No scleral icterus.  Neck: Normal range of motion. Neck supple. No tracheal deviation present.  Cardiovascular: Normal rate, regular rhythm, normal heart sounds and intact distal pulses.  Exam reveals no gallop and no friction rub.   No murmur heard. Pulmonary/Chest: Effort normal and breath sounds normal. No respiratory distress. She has no wheezes.  Abdominal: Soft. Bowel sounds are normal. She exhibits no distension. There is no tenderness. There is no rebound.  Musculoskeletal: Normal range of motion. She exhibits no edema.  Lymphadenopathy:    She has no cervical adenopathy.  Neurological: She is alert and oriented to person, place, and time. No cranial  nerve deficit.  Skin: Skin is warm and dry. No rash noted. No erythema.  Left mastectomy site: 5cm area granulation tissue flush with skin except for a 1 cm area laterally, silver nitrate applied  Right mastectomy site: 3cm area of granulation tissue all flush with skin, silver nitrate applied.   Psychiatric: She has a normal mood and affect. Her behavior is normal. Judgment and thought content normal.  Vitals reviewed.      Assessment:     65 yr old with right lobular breast cancer, s/p bilateral mastectomy    Plan:     Mastectomy wounds:  Healing well, silver nitrate applied to decrease drainage, changed to just dry dressings   Chemotherapy to begin soon, needs port placement. I discussed the risks, benefits, alternatives and expected outcomes with the patient including bleeding, infection, pneumothorax, possible need for chest  tube placement, possible need for hospital observation, possible need for removal of port if not working properly or in the case of infection. Patient understands and agrees to have port placement hopefully Jan 16 or 17th.

## 2015-12-23 NOTE — Anesthesia Preprocedure Evaluation (Addendum)
Anesthesia Evaluation  Patient identified by MRN, date of birth, ID band Patient awake    Reviewed: Allergy & Precautions, H&P , NPO status , Patient's Chart, lab work & pertinent test results, reviewed documented beta blocker date and time   Airway Mallampati: III  TM Distance: >3 FB Neck ROM: full    Dental  (+) Teeth Intact   Pulmonary neg pulmonary ROS,    Pulmonary exam normal        Cardiovascular Exercise Tolerance: Good hypertension, negative cardio ROS Normal cardiovascular exam Rate:Normal     Neuro/Psych negative neurological ROS  negative psych ROS   GI/Hepatic negative GI ROS, Neg liver ROS, GERD  ,  Endo/Other  negative endocrine ROSHypothyroidism   Renal/GU negative Renal ROS  negative genitourinary   Musculoskeletal   Abdominal   Peds  Hematology negative hematology ROS (+)   Anesthesia Other Findings   Reproductive/Obstetrics negative OB ROS                            Anesthesia Physical Anesthesia Plan  ASA: III  Anesthesia Plan: General LMA   Post-op Pain Management:    Induction:   Airway Management Planned:   Additional Equipment:   Intra-op Plan:   Post-operative Plan:   Informed Consent: I have reviewed the patients History and Physical, chart, labs and discussed the procedure including the risks, benefits and alternatives for the proposed anesthesia with the patient or authorized representative who has indicated his/her understanding and acceptance.     Plan Discussed with: CRNA  Anesthesia Plan Comments:         Anesthesia Quick Evaluation

## 2015-12-23 NOTE — Anesthesia Procedure Notes (Signed)
Procedure Name: LMA Insertion Date/Time: 12/23/2015 10:05 AM Performed by: Rosaria Ferries, Margery Szostak Pre-anesthesia Checklist: Patient identified, Emergency Drugs available, Suction available and Patient being monitored Patient Re-evaluated:Patient Re-evaluated prior to inductionOxygen Delivery Method: Circle system utilized Preoxygenation: Pre-oxygenation with 100% oxygen Intubation Type: IV induction LMA: LMA inserted LMA Size: 4.0 Number of attempts: 1 Tube secured with: Tape Dental Injury: Teeth and Oropharynx as per pre-operative assessment

## 2015-12-23 NOTE — Interval H&P Note (Signed)
History and Physical Interval Note:  12/23/2015 9:15 AM  Kaylee Reyes  has presented today for surgery, with the diagnosis of Right lobular carcinoma  The various methods of treatment have been discussed with the patient and family. After consideration of risks, benefits and other options for treatment, the patient has consented to  Procedure(s): INSERTION PORT-A-CATH (Left) as a surgical intervention .  The patient's history has been reviewed, patient examined, no change in status, stable for surgery.  I have reviewed the patient's chart and labs.  Questions were answered to the patient's satisfaction.     Rome Schlauch L Kristof Nadeem

## 2015-12-23 NOTE — Transfer of Care (Signed)
Immediate Anesthesia Transfer of Care Note  Patient: Kaylee Reyes  Procedure(s) Performed: Procedure(s): INSERTION PORT-A-CATH (Left)  Patient Location: PACU  Anesthesia Type:General  Level of Consciousness: awake, alert , oriented and patient cooperative  Airway & Oxygen Therapy: Patient Spontanous Breathing and Patient connected to nasal cannula oxygen  Post-op Assessment: Report given to RN and Post -op Vital signs reviewed and stable  Post vital signs: Reviewed and stable  Last Vitals:  Filed Vitals:   12/23/15 0903  BP: 162/94  Pulse: 88  Temp: 37.1 C  Resp: 16    Complications: No apparent anesthesia complications

## 2015-12-23 NOTE — Op Note (Signed)
Central Venous Catheter Insertion Procedure Note  Procedure: Insertion of Central Venous Catheter  Indications:  vascular access  Pre op Diagnosis:  Left breat cancer, need for chemotherapy  Postop diagnosis: left breast cancer, need for chemotherapy.   Surgeon: Hubbard Robinson, MD   Anesthesia: general with LMA and Local  Procedure Details  Informed consent was obtained for the procedure, including sedation.  Risks of lung perforation, hemorrhage, arrhythmia, and adverse drug reaction were discussed.  After consent obtained patient taken back to the operating room suite and placed on the table in the supine position.  Anesthasia was induced and LMA placed without difficulty.  Time out was performed to confirm the patient name and procedure.   Under sterile conditions the skin above the midline chest subclavian vein was prepped with chlorprep and covered with a sterile drape. Local anesthesia was applied to the skin and subcutaneous tissues.  An 18-gauge needle was then inserted into the vein. A guide wire was then passed easily through the catheter and position confirmed with fluroscopic guidance. There were no arrhythmias. The needle was then removed, small skin incision made around the wire and inducer catheter placed.  After the wire was removed, the catheter was then placed using fluoroscopic guidance to 23cm and tear away catheter removed.  At this time attention was turned to the inferior area were local anesthetic was injected into skin.  15 blade scalpel used to make a 2cm incision and bovie electrocautery used to make an inferior pocket.  A tunneling device was used to take the catheter subcutaneous down to the inferior pocket.  The porta cath was then placed on the catheter.  A need was used to flush the port and was easily accomplished.  2-0 Prolene was then used to suture the port in place in three areas.  The subcutaneous tissue was then reapproximated using 3-o Vicryl and  subcuticular closure using 4-0 Monocryl.   Sterile glue was applied to the wounds.  Final fluoroscopic imaging was used to confirm placement.  Heuber needle was used to access the port and drew back blood and flushed heparinized saline easily.    All counts were correct at the end of the case.   Findings: There were no changes to vital signs. Catheter was flushed with 10cc of heparinzed saline.  Patient did tolerate procedure well.  Recommendations: CXR ordered to verify placement.

## 2015-12-26 ENCOUNTER — Telehealth: Payer: Self-pay | Admitting: Surgery

## 2015-12-26 NOTE — Telephone Encounter (Signed)
Patient would like to speak with nurse, she has some questions about her port a cath. She wants to know if everything can be done through her port a cath. In example, when she has lab work done if they can access her port for that instead of being stuck with a needle in her arm.

## 2015-12-26 NOTE — Anesthesia Postprocedure Evaluation (Signed)
Anesthesia Post Note  Patient: Kaylee Reyes  Procedure(s) Performed: Procedure(s) (LRB): INSERTION PORT-A-CATH (Left)  Patient location during evaluation: PACU Anesthesia Type: General Level of consciousness: awake and alert Pain management: pain level controlled Vital Signs Assessment: post-procedure vital signs reviewed and stable Respiratory status: spontaneous breathing, nonlabored ventilation, respiratory function stable and patient connected to nasal cannula oxygen Cardiovascular status: blood pressure returned to baseline and stable Postop Assessment: no signs of nausea or vomiting Anesthetic complications: no    Last Vitals:  Filed Vitals:   12/23/15 1218 12/23/15 1223  BP: 174/71 138/62  Pulse: 73 73  Temp: 35.6 C   Resp:      Last Pain:  Filed Vitals:   12/24/15 0818  PainSc: 0-No pain                 Molli Barrows

## 2015-12-29 ENCOUNTER — Encounter: Payer: Self-pay | Admitting: Surgery

## 2015-12-29 ENCOUNTER — Inpatient Hospital Stay: Payer: BLUE CROSS/BLUE SHIELD | Attending: Oncology | Admitting: Oncology

## 2015-12-29 ENCOUNTER — Inpatient Hospital Stay: Payer: BLUE CROSS/BLUE SHIELD

## 2015-12-29 ENCOUNTER — Ambulatory Visit (INDEPENDENT_AMBULATORY_CARE_PROVIDER_SITE_OTHER): Payer: BLUE CROSS/BLUE SHIELD | Admitting: Surgery

## 2015-12-29 VITALS — BP 149/87 | HR 66

## 2015-12-29 VITALS — BP 170/95 | HR 73 | Temp 97.8°F | Wt 289.0 lb

## 2015-12-29 VITALS — BP 168/90 | HR 70 | Temp 97.2°F | Resp 18 | Wt 289.3 lb

## 2015-12-29 DIAGNOSIS — Z9013 Acquired absence of bilateral breasts and nipples: Secondary | ICD-10-CM

## 2015-12-29 DIAGNOSIS — C50911 Malignant neoplasm of unspecified site of right female breast: Secondary | ICD-10-CM | POA: Diagnosis not present

## 2015-12-29 DIAGNOSIS — E785 Hyperlipidemia, unspecified: Secondary | ICD-10-CM | POA: Diagnosis not present

## 2015-12-29 DIAGNOSIS — Z79899 Other long term (current) drug therapy: Secondary | ICD-10-CM | POA: Diagnosis not present

## 2015-12-29 DIAGNOSIS — K649 Unspecified hemorrhoids: Secondary | ICD-10-CM | POA: Insufficient documentation

## 2015-12-29 DIAGNOSIS — E039 Hypothyroidism, unspecified: Secondary | ICD-10-CM | POA: Diagnosis not present

## 2015-12-29 DIAGNOSIS — C50912 Malignant neoplasm of unspecified site of left female breast: Secondary | ICD-10-CM

## 2015-12-29 DIAGNOSIS — I10 Essential (primary) hypertension: Secondary | ICD-10-CM | POA: Diagnosis not present

## 2015-12-29 DIAGNOSIS — Z170 Estrogen receptor positive status [ER+]: Secondary | ICD-10-CM | POA: Insufficient documentation

## 2015-12-29 DIAGNOSIS — D701 Agranulocytosis secondary to cancer chemotherapy: Secondary | ICD-10-CM | POA: Diagnosis not present

## 2015-12-29 DIAGNOSIS — K219 Gastro-esophageal reflux disease without esophagitis: Secondary | ICD-10-CM | POA: Insufficient documentation

## 2015-12-29 DIAGNOSIS — M79606 Pain in leg, unspecified: Secondary | ICD-10-CM | POA: Diagnosis not present

## 2015-12-29 DIAGNOSIS — Z5111 Encounter for antineoplastic chemotherapy: Secondary | ICD-10-CM | POA: Insufficient documentation

## 2015-12-29 LAB — COMPREHENSIVE METABOLIC PANEL
ALBUMIN: 3.4 g/dL — AB (ref 3.5–5.0)
ALK PHOS: 89 U/L (ref 38–126)
ALT: 15 U/L (ref 14–54)
AST: 17 U/L (ref 15–41)
Anion gap: 9 (ref 5–15)
BUN: 14 mg/dL (ref 6–20)
CHLORIDE: 99 mmol/L — AB (ref 101–111)
CO2: 24 mmol/L (ref 22–32)
Calcium: 8.7 mg/dL — ABNORMAL LOW (ref 8.9–10.3)
Creatinine, Ser: 0.97 mg/dL (ref 0.44–1.00)
GFR calc non Af Amer: >60 mL/min (ref >60)
GLUCOSE: 152 mg/dL — AB (ref 65–99)
Potassium: 3.6 mmol/L (ref 3.5–5.1)
SODIUM: 132 mmol/L — AB (ref 135–145)
Total Bilirubin: 0.5 mg/dL (ref 0.3–1.2)
Total Protein: 8 g/dL (ref 6.5–8.1)

## 2015-12-29 LAB — CBC WITH DIFFERENTIAL/PLATELET
BASOS ABS: 0.1 10*3/uL (ref 0–0.1)
Basophils Relative: 1 %
EOS ABS: 0.4 10*3/uL (ref 0–0.7)
EOS PCT: 4 %
HCT: 31.9 % — ABNORMAL LOW (ref 35.0–47.0)
HEMOGLOBIN: 10.5 g/dL — AB (ref 12.0–16.0)
LYMPHS ABS: 1.5 10*3/uL (ref 1.0–3.6)
Lymphocytes Relative: 17 %
MCH: 27.5 pg (ref 26.0–34.0)
MCHC: 32.9 g/dL (ref 32.0–36.0)
MCV: 83.4 fL (ref 80.0–100.0)
Monocytes Absolute: 0.3 10*3/uL (ref 0.2–0.9)
Monocytes Relative: 3 %
NEUTROS PCT: 75 %
Neutro Abs: 6.6 10*3/uL — ABNORMAL HIGH (ref 1.4–6.5)
PLATELETS: 262 10*3/uL (ref 150–440)
RBC: 3.83 MIL/uL (ref 3.80–5.20)
RDW: 15.4 % — ABNORMAL HIGH (ref 11.5–14.5)
WBC: 8.8 10*3/uL (ref 3.6–11.0)

## 2015-12-29 MED ORDER — PROCHLORPERAZINE MALEATE 10 MG PO TABS: 10.0000 mg | ORAL_TABLET | Freq: Four times a day (QID) | ORAL | Status: DC | PRN

## 2015-12-29 MED ORDER — SODIUM CHLORIDE 0.9 % IV SOLN
Freq: Once | INTRAVENOUS | Status: AC
  Administered 2015-12-29: 11:00:00 via INTRAVENOUS
  Filled 2015-12-29: qty 1000

## 2015-12-29 MED ORDER — SODIUM CHLORIDE 0.9 % IV SOLN
1500.0000 mg | Freq: Once | INTRAVENOUS | Status: AC
  Administered 2015-12-29: 1500 mg via INTRAVENOUS
  Filled 2015-12-29: qty 50

## 2015-12-29 MED ORDER — SODIUM CHLORIDE 0.9 % IJ SOLN
10.0000 mL | INTRAMUSCULAR | Status: DC | PRN
  Administered 2015-12-29: 10 mL
  Filled 2015-12-29: qty 10

## 2015-12-29 MED ORDER — HEPARIN SOD (PORK) LOCK FLUSH 100 UNIT/ML IV SOLN
500.0000 [IU] | Freq: Once | INTRAVENOUS | Status: AC | PRN
  Administered 2015-12-29: 500 [IU]
  Filled 2015-12-29: qty 5

## 2015-12-29 MED ORDER — DOCETAXEL CHEMO INJECTION 160 MG/16ML
75.0000 mg/m2 | Freq: Once | INTRAVENOUS | Status: AC
  Administered 2015-12-29: 190 mg via INTRAVENOUS
  Filled 2015-12-29: qty 19

## 2015-12-29 MED ORDER — LIDOCAINE-PRILOCAINE 2.5-2.5 % EX CREA: TOPICAL_CREAM | CUTANEOUS | Status: DC

## 2015-12-29 MED ORDER — PALONOSETRON HCL INJECTION 0.25 MG/5ML
0.2500 mg | Freq: Once | INTRAVENOUS | Status: AC
  Administered 2015-12-29: 0.25 mg via INTRAVENOUS
  Filled 2015-12-29: qty 5

## 2015-12-29 MED ORDER — ONDANSETRON HCL 8 MG PO TABS: 8.0000 mg | ORAL_TABLET | Freq: Two times a day (BID) | ORAL | Status: DC | PRN

## 2015-12-29 MED ORDER — SODIUM CHLORIDE 0.9 % IV SOLN
10.0000 mg | Freq: Once | INTRAVENOUS | Status: AC
  Administered 2015-12-29: 10 mg via INTRAVENOUS
  Filled 2015-12-29: qty 1

## 2015-12-29 MED ORDER — PERCOCET 5-325 MG PO TABS: 1.0000 | ORAL_TABLET | ORAL | Status: DC | PRN

## 2015-12-29 NOTE — Progress Notes (Signed)
65 yr old s/p bilateral mastectomy healing wounds and port placement last week.   Patient doing well, no pain.  Working on shoulder ROM  Filed Vitals:   12/29/15 0822  BP: 170/95  Pulse: 73  Temp: 97.8 F (36.6 C)   PE;  Gen: NAD Chest: incision sites healling well from port placement Mastectomy inicsion sites: good granulation tissue, some serous drainage.   A/P: Applied silver nitrate to wounds to help decrease drainage.  She is going for first round of chemo today.

## 2015-12-29 NOTE — Progress Notes (Signed)
Patient states she is having a little exertional SOB.  Also states she has been constipated and used Miralax which gave her diarrhea.   States her mouth and lips are dry.  She has been drinking water but probably not enough.  Thinks she may be dehydrated.

## 2015-12-31 ENCOUNTER — Inpatient Hospital Stay: Payer: BLUE CROSS/BLUE SHIELD

## 2015-12-31 DIAGNOSIS — C50911 Malignant neoplasm of unspecified site of right female breast: Secondary | ICD-10-CM | POA: Diagnosis not present

## 2015-12-31 DIAGNOSIS — C50912 Malignant neoplasm of unspecified site of left female breast: Secondary | ICD-10-CM

## 2015-12-31 MED ORDER — PEGFILGRASTIM INJECTION 6 MG/0.6ML ~~LOC~~
6.0000 mg | PREFILLED_SYRINGE | Freq: Once | SUBCUTANEOUS | Status: AC
  Administered 2015-12-31: 6 mg via SUBCUTANEOUS
  Filled 2015-12-31: qty 0.6

## 2015-12-31 NOTE — Progress Notes (Signed)
[place]  Telephone:([phone]) [phone] [phone]) [phone]  ID: Jaquita Folds OB: 1951/03/08  MR#: [id number]  [id number]  Patient Care Team: Birdie Sons, MD as PCP - General (Family Medicine) [name], MD as Consulting Physician (Gastroenterology)  CHIEF COMPLAINT:  Chief Complaint  Patient presents with  . Breast Cancer    INTERVAL HISTORY: Patient returns to clinic today for further evaluation and initiation of adjuvant chemotherapy. Her treatment was delayed secondary to a surgical site infection on her breast. She currently feels well and is back to her baseline. She has no neurologic complaints. She denies any recent fevers or illnesses. She denies any pain. She has a good appetite and denies weight loss. She denies any chest pain or shortness of breath. She has no nausea, vomiting, constipation, or diarrhea. She has no urinary complaints. Patient offers no further specific complaints today.  REVIEW OF SYSTEMS:   Review of Systems  Constitutional: Negative for fever and malaise/fatigue.  Cardiovascular: Negative.   Gastrointestinal: Negative.   Musculoskeletal: Negative.   Neurological: Negative.  Negative for weakness.  Psychiatric/Behavioral: The patient is nervous/anxious.     As per HPI. Otherwise, a complete review of systems is negatve.  PAST MEDICAL HISTORY: Past Medical History  Diagnosis Date  . Hypertension   . GERD (gastroesophageal reflux disease)   . Thyroid disease   . Hyperlipidemia   . History of chicken pox   . Hypothyroidism   . Cancer [facility])     breast cancer    PAST SURGICAL HISTORY: Past Surgical History  Procedure Laterality Date  . Cholecystectomy  1990  . Wrist surgery    . Cesarean section  1988  . Exploratory laparotomy      Left breast  . Abdominal hysterectomy  07/13/2013    Total. with BSO for postmenopausal bleeding and fibroids with large cervical polyp. Dr. Levy Sjogren and Dr. Glennon Mac at [facility]  . Breast  surgery  2011    BREAST BIOPSY  . Abdominal ultrasound  04/17/2011    Hepatomegaly with borderline slenomegaly. Suggestive of fatty liver. s/p cholecystectomy. Portions of aorta obscured  . Mastectomy w/ sentinel node biopsy Bilateral 10/28/2015    Procedure: MASTECTOMY WITH SENTINEL LYMPH NODE BIOPSY;  Surgeon: Hubbard Robinson, MD;  Location: [facility] ORS;  Service: General;  Laterality: Bilateral;  . Portacath placement Left 12/23/2015    Procedure: INSERTION PORT-A-CATH;  Surgeon: Hubbard Robinson, MD;  Location: [facility] ORS;  Service: General;  Laterality: Left;    FAMILY HISTORY Family History  Problem Relation Age of Onset  . Hypertension Mother   . Thyroid disease Mother   . Hypertension Brother   . Diabetes Brother        ADVANCED DIRECTIVES:    HEALTH MAINTENANCE: Social History  Substance Use Topics  . Smoking status: Never Smoker   . Smokeless tobacco: Never Used  . Alcohol Use: No     Colonoscopy:  PAP:  Bone density:  Lipid panel:  Allergies  Allergen Reactions  . Penicillins Shortness Of Breath  . Nickel Hives  . Pravastatin Sodium     Muscle pain, muscle cramps, muscle weakness    Current Outpatient Prescriptions  Medication Sig Dispense Refill  . atenolol (TENORMIN) 50 MG tablet Take 1 tablet (50 mg total) by mouth daily. 90 tablet 4  . cyclobenzaprine (FLEXERIL) 5 MG tablet Take 1 tablet (5 mg total) by mouth 3 (three) times daily as needed for muscle spasms. 60 tablet 1  . docusate sodium (COLACE)  100 MG capsule Take 100 mg by mouth daily.    . fexofenadine (ALLEGRA) 180 MG tablet Take 180 mg by mouth daily.    . hydrochlorothiazide (HYDRODIURIL) 25 MG tablet Take 1 tablet by mouth daily.    Marland Kitchen ketoconazole (NIZORAL) 2 % cream Apply 1 application topically daily. 50 g 0  . lansoprazole (PREVACID) 15 MG capsule Take 15 mg by mouth daily at 12 noon.    Marland Kitchen levothyroxine (SYNTHROID, LEVOTHROID) 75 MCG tablet TAKE ONE TABLET BY MOUTH ONCE DAILY 30 tablet 5   . lidocaine-prilocaine (EMLA) cream Apply 1 application topically once. Apply to port 1-2 hours prior to chemotherapy appointment. Cover area with plastic wrap. 30 g 1  . lidocaine-prilocaine (EMLA) cream Apply to affected area once 30 g 3  . magic mouthwash SOLN RINSE AND SPIT ONE TEASPOONFUL BY MOUTH EVERY 3 TO 4 HOURS AS NEEDED 100 mL 1  . meclizine (ANTIVERT) 25 MG tablet TAKE ONE TABLET BY MOUTH EVERY 4 TO 6 HOURS AS NEEDED 30 tablet 3  . ondansetron (ZOFRAN) 8 MG tablet Take 1 tablet (8 mg total) by mouth 2 (two) times daily as needed for refractory nausea / vomiting. Start on day 3 after chemo. 30 tablet 1  . PERCOCET 5-325 MG tablet Take 1-2 tablets by mouth every 4 (four) hours as needed for severe pain. 60 tablet 0  . prochlorperazine (COMPAZINE) 10 MG tablet Take 1 tablet (10 mg total) by mouth every 6 (six) hours as needed (Nausea or vomiting). 30 tablet 1   No current facility-administered medications for this visit.    OBJECTIVE: Filed Vitals:   12/29/15 0945  BP: 168/90  Pulse: 70  Temp: 97.2 F (36.2 C)  Resp: 18     Body mass index is 46.72 kg/(m^2).    ECOG FS:0 - Asymptomatic  General: Well-developed, well-nourished, no acute distress. Eyes: Pink conjunctiva, anicteric sclera. Breasts: Surgical scar on right breast without erythema. Lungs: Clear to auscultation bilaterally. Heart: Regular rate and rhythm. No rubs, murmurs, or gallops. Abdomen: Soft, nontender, nondistended. No organomegaly noted, normoactive bowel sounds. Musculoskeletal: No edema, cyanosis, or clubbing. Neuro: Alert, answering all questions appropriately. Cranial nerves grossly intact. Skin: No rashes or petechiae noted. Psych: Normal affect.   LAB RESULTS:  Lab Results  Component Value Date   NA 132* 12/29/2015   K 3.6 12/29/2015   CL 99* 12/29/2015   CO2 24 12/29/2015   GLUCOSE 152* 12/29/2015   BUN 14 12/29/2015   CREATININE 0.97 12/29/2015   CALCIUM 8.7* 12/29/2015   PROT 8.0  12/29/2015   ALBUMIN 3.4* 12/29/2015   AST 17 12/29/2015   ALT 15 12/29/2015   ALKPHOS 89 12/29/2015   BILITOT 0.5 12/29/2015   GFRNONAA >60 12/29/2015   GFRAA >60 12/29/2015    Lab Results  Component Value Date   WBC 8.8 12/29/2015   NEUTROABS 6.6* 12/29/2015   HGB 10.5* 12/29/2015   HCT 31.9* 12/29/2015   MCV 83.4 12/29/2015   PLT 262 12/29/2015     STUDIES: Dg Chest Port 1 View  12/23/2015  CLINICAL DATA:  Catheter placement EXAM: PORTABLE CHEST 1 VIEW COMPARISON:  12/18/2010 chest radiograph. FINDINGS: Left subclavian MediPort terminates in the high right atrium near the cavoatrial junction. There is abnormal focal narrowing of the left subclavian MediPort catheter between the medial left clavicle and anterior left first rib. Stable cardiomediastinal silhouette with mild cardiomegaly. No pneumothorax. No pleural effusion. Lungs appear clear, with no acute consolidative airspace disease and no pulmonary  edema. IMPRESSION: 1. Abnormal focal narrowing of the left subclavian MediPort catheter between the medial left clavicle and anterior left first rib, which is a risk factor for catheter pinch-off. 2. No pneumothorax. 3. Stable mild cardiomegaly with no pulmonary edema. These results were called by telephone at the time of interpretation on 12/23/2015 at 12:36 pm to RN [facility], who verbally acknowledged these results. Electronically Signed   By: Ilona Sorrel M.D.   On: 12/23/2015 12:49   Dg C-arm 1-60 Min  12/23/2015  CLINICAL DATA:  Port placement EXAM: DG C-ARM 61-120 MIN COMPARISON:  None. FINDINGS: There is a left subclavian porta catheter with tip at the SVC or upper right atrial level. No flow limiting kink. IMPRESSION: Left subclavian porta catheter with tip at the lower SVC or upper right atrium. Electronically Signed   By: Monte Fantasia M.D.   On: 12/23/2015 11:28    ASSESSMENT: Pathologic stage IIB ER/PR positive invasive lobular carcinoma of the right breast.  PLAN:      1. Breast cancer: Patient is now status post mastectomy. Surgical pathology results as well as PET scan results reviewed independently confirming stage IIB disease. Proceed with cycle 1 of 4 of Taxotere and Cytoxan. She will also require XRT at the conclusion of her treatments. Finally, patient will benefit from an aromatase inhibitor for 5 years at the conclusion of her treatments. Return to clinic in 2 days for Neulasta, 1 week for laboratory work and further evaluation, and then in 3 weeks for consideration of cycle 2. 2. Wound infection: Resolved, monitor closely while on chemotherapy.   Patient expressed understanding and was in agreement with this plan. She also understands that She can call clinic at any time with any questions, concerns, or complaints.   Invasive lobular carcinoma of right breast, stage 2 ([place])   Staging form: Breast, AJCC 7th Edition     Pathologic stage: Stage IIB (T2, N1a, M0) - Signed by Lloyd Huger, MD on 10/22/2015   Lloyd Huger, MD   12/31/2015 3:05 PM

## 2016-01-05 ENCOUNTER — Inpatient Hospital Stay (HOSPITAL_BASED_OUTPATIENT_CLINIC_OR_DEPARTMENT_OTHER): Payer: BLUE CROSS/BLUE SHIELD | Admitting: Oncology

## 2016-01-05 ENCOUNTER — Inpatient Hospital Stay: Payer: BLUE CROSS/BLUE SHIELD

## 2016-01-05 VITALS — BP 138/84 | HR 93 | Temp 96.7°F | Resp 16 | Wt 286.6 lb

## 2016-01-05 DIAGNOSIS — D701 Agranulocytosis secondary to cancer chemotherapy: Secondary | ICD-10-CM | POA: Diagnosis not present

## 2016-01-05 DIAGNOSIS — K649 Unspecified hemorrhoids: Secondary | ICD-10-CM | POA: Diagnosis not present

## 2016-01-05 DIAGNOSIS — C50912 Malignant neoplasm of unspecified site of left female breast: Secondary | ICD-10-CM

## 2016-01-05 DIAGNOSIS — Z170 Estrogen receptor positive status [ER+]: Secondary | ICD-10-CM | POA: Diagnosis not present

## 2016-01-05 DIAGNOSIS — C50911 Malignant neoplasm of unspecified site of right female breast: Secondary | ICD-10-CM | POA: Diagnosis not present

## 2016-01-05 DIAGNOSIS — Z9013 Acquired absence of bilateral breasts and nipples: Secondary | ICD-10-CM

## 2016-01-05 DIAGNOSIS — Z79899 Other long term (current) drug therapy: Secondary | ICD-10-CM

## 2016-01-05 DIAGNOSIS — M79606 Pain in leg, unspecified: Secondary | ICD-10-CM

## 2016-01-05 LAB — CBC WITH DIFFERENTIAL/PLATELET
BASOS ABS: 0 10*3/uL (ref 0–0.1)
Basophils Relative: 2 %
Eosinophils Absolute: 0.1 10*3/uL (ref 0–0.7)
HEMATOCRIT: 30 % — AB (ref 35.0–47.0)
Hemoglobin: 10.2 g/dL — ABNORMAL LOW (ref 12.0–16.0)
LYMPHS ABS: 0.9 10*3/uL — AB (ref 1.0–3.6)
MCH: 28 pg (ref 26.0–34.0)
MCHC: 33.8 g/dL (ref 32.0–36.0)
MCV: 82.8 fL (ref 80.0–100.0)
MONO ABS: 0.2 10*3/uL (ref 0.2–0.9)
NEUTROS ABS: 0.5 10*3/uL — AB (ref 1.4–6.5)
Neutrophils Relative %: 29 %
PLATELETS: 189 10*3/uL (ref 150–440)
RBC: 3.63 MIL/uL — ABNORMAL LOW (ref 3.80–5.20)
RDW: 15.5 % — AB (ref 11.5–14.5)
WBC: 1.7 10*3/uL — ABNORMAL LOW (ref 3.6–11.0)

## 2016-01-05 LAB — COMPREHENSIVE METABOLIC PANEL
ALT: 21 U/L (ref 14–54)
AST: 24 U/L (ref 15–41)
Albumin: 3.8 g/dL (ref 3.5–5.0)
Alkaline Phosphatase: 83 U/L (ref 38–126)
Anion gap: 8 (ref 5–15)
BILIRUBIN TOTAL: 0.7 mg/dL (ref 0.3–1.2)
BUN: 9 mg/dL (ref 6–20)
CO2: 27 mmol/L (ref 22–32)
Calcium: 8.9 mg/dL (ref 8.9–10.3)
Chloride: 97 mmol/L — ABNORMAL LOW (ref 101–111)
Creatinine, Ser: 0.93 mg/dL (ref 0.44–1.00)
Glucose, Bld: 126 mg/dL — ABNORMAL HIGH (ref 65–99)
POTASSIUM: 3.6 mmol/L (ref 3.5–5.1)
Sodium: 132 mmol/L — ABNORMAL LOW (ref 135–145)
TOTAL PROTEIN: 7.9 g/dL (ref 6.5–8.1)

## 2016-01-05 MED ORDER — HYDROCORTISONE 2.5 % RE CREA: 1.0000 "application " | TOPICAL_CREAM | Freq: Two times a day (BID) | RECTAL | Status: DC

## 2016-01-05 NOTE — Progress Notes (Signed)
Patient has noticed a slight decrease in appetite.  The night after Neulasta injection she had severe leg pain from knee down that lasted 3 days.  Now has new muscle spasms in low back that was not relieved with muscle relaxer or pain medication.  Has history hemorrhoids and they seem to be flared up at this time.

## 2016-01-05 NOTE — Progress Notes (Signed)
[place]  Telephone:(336) [phone] Fax:(336) [phone]  ID: Jaquita Folds OB: [birth year]  MR#: [id number]  [id number]  Patient Care Team: Birdie Sons, MD as PCP - General (Family Medicine) [name], MD as Consulting Physician (Gastroenterology)  CHIEF COMPLAINT:  Chief Complaint  Patient presents with  . Breast Cancer    INTERVAL HISTORY: Patient returns to clinic today for further evaluation and to assess her toleration of cycle 1 of Taxotere and Cytoxan. Patient had leg pain for several days after her Neulasta injection that was helped with Tylenol and Claritin. She also complaining of back spasms as well as worsening hemorrhoids. She otherwise tolerated her treatment well. She has no neurologic complaints. She denies any recent fevers or illnesses. She denies any pain. She has a good appetite and denies weight loss. She denies any chest pain or shortness of breath. She has no nausea, vomiting, constipation, or diarrhea. She has no urinary complaints. Patient offers no further specific complaints today.  REVIEW OF SYSTEMS:   Review of Systems  Constitutional: Negative for fever and malaise/fatigue.  Respiratory: Negative.  Negative for shortness of breath.   Cardiovascular: Negative.   Gastrointestinal: Negative.   Musculoskeletal: Positive for back pain and joint pain.  Neurological: Negative.  Negative for weakness.  Psychiatric/Behavioral: The patient is nervous/anxious.     As per HPI. Otherwise, a complete review of systems is negatve.  PAST MEDICAL HISTORY: Past Medical History  Diagnosis Date  . Hypertension   . GERD (gastroesophageal reflux disease)   . Thyroid disease   . Hyperlipidemia   . History of chicken pox   . Hypothyroidism   . Cancer [facility])     breast cancer    PAST SURGICAL HISTORY: Past Surgical History  Procedure Laterality Date  . Cholecystectomy  1990  . Wrist surgery    . Cesarean section  1988  .  Exploratory laparotomy      Left breast  . Abdominal hysterectomy  07/13/2013    Total. with BSO for postmenopausal bleeding and fibroids with large cervical polyp. Dr. Levy Sjogren and Dr. Glennon Mac at [facility]  . Breast surgery  2011    BREAST BIOPSY  . Abdominal ultrasound  04/17/2011    Hepatomegaly with borderline slenomegaly. Suggestive of fatty liver. s/p cholecystectomy. Portions of aorta obscured  . Mastectomy w/ sentinel node biopsy Bilateral 10/28/2015    Procedure: MASTECTOMY WITH SENTINEL LYMPH NODE BIOPSY;  Surgeon: Hubbard Robinson, MD;  Location: [facility] ORS;  Service: General;  Laterality: Bilateral;  . Portacath placement Left 12/23/2015    Procedure: INSERTION PORT-A-CATH;  Surgeon: Hubbard Robinson, MD;  Location: [facility] ORS;  Service: General;  Laterality: Left;    FAMILY HISTORY Family History  Problem Relation Age of Onset  . Hypertension Mother   . Thyroid disease Mother   . Hypertension Brother   . Diabetes Brother        ADVANCED DIRECTIVES:    HEALTH MAINTENANCE: Social History  Substance Use Topics  . Smoking status: Never Smoker   . Smokeless tobacco: Never Used  . Alcohol Use: No     Colonoscopy:  PAP:  Bone density:  Lipid panel:  Allergies  Allergen Reactions  . Penicillins Shortness Of Breath  . Nickel Hives  . Pravastatin Sodium     Muscle pain, muscle cramps, muscle weakness    Current Outpatient Prescriptions  Medication Sig Dispense Refill  . atenolol (TENORMIN) 50 MG tablet Take 1 tablet (50 mg total) by mouth daily. [place]  tablet 4  . cyclobenzaprine (FLEXERIL) 5 MG tablet Take 1 tablet (5 mg total) by mouth 3 (three) times daily as needed for muscle spasms. 60 tablet 1  . docusate sodium (COLACE) 100 MG capsule Take 100 mg by mouth daily.    . fexofenadine (ALLEGRA) 180 MG tablet Take 180 mg by mouth daily.    . hydrochlorothiazide (HYDRODIURIL) 25 MG tablet Take 1 tablet by mouth daily.    Marland Kitchen ketoconazole (NIZORAL) 2 % cream Apply 1  application topically daily. 50 g 0  . lansoprazole (PREVACID) 15 MG capsule Take 15 mg by mouth daily at 12 noon.    Marland Kitchen levothyroxine (SYNTHROID, LEVOTHROID) 75 MCG tablet TAKE ONE TABLET BY MOUTH ONCE DAILY 30 tablet 5  . lidocaine-prilocaine (EMLA) cream Apply 1 application topically once. Apply to port 1-2 hours prior to chemotherapy appointment. Cover area with plastic wrap. 30 g 1  . lidocaine-prilocaine (EMLA) cream Apply to affected area once 30 g 3  . magic mouthwash SOLN RINSE AND SPIT ONE TEASPOONFUL BY MOUTH EVERY 3 TO 4 HOURS AS NEEDED 100 mL 1  . meclizine (ANTIVERT) 25 MG tablet TAKE ONE TABLET BY MOUTH EVERY 4 TO 6 HOURS AS NEEDED 30 tablet 3  . ondansetron (ZOFRAN) 8 MG tablet Take 1 tablet (8 mg total) by mouth 2 (two) times daily as needed for refractory nausea / vomiting. Start on day 3 after chemo. 30 tablet 1  . PERCOCET 5-325 MG tablet Take 1-2 tablets by mouth every 4 (four) hours as needed for severe pain. 60 tablet 0  . prochlorperazine (COMPAZINE) 10 MG tablet Take 1 tablet (10 mg total) by mouth every 6 (six) hours as needed (Nausea or vomiting). 30 tablet 1  . hydrocortisone (ANUSOL-HC) 2.5 % rectal cream Place 1 application rectally 2 (two) times daily. 30 g 1   No current facility-administered medications for this visit.    OBJECTIVE: Filed Vitals:   01/05/16 1150  BP: 138/84  Pulse: 93  Temp: 96.7 F (35.9 C)  Resp: 16     Body mass index is 46.28 kg/(m^2).    ECOG FS:0 - Asymptomatic  General: Well-developed, well-nourished, no acute distress. Eyes: Pink conjunctiva, anicteric sclera. Breasts: Surgical scar on right breast without erythema. Lungs: Clear to auscultation bilaterally. Heart: Regular rate and rhythm. No rubs, murmurs, or gallops. Abdomen: Soft, nontender, nondistended. No organomegaly noted, normoactive bowel sounds. Musculoskeletal: No edema, cyanosis, or clubbing. Neuro: Alert, answering all questions appropriately. Cranial nerves grossly  intact. Skin: No rashes or petechiae noted. Psych: Normal affect.   LAB RESULTS:  Lab Results  Component Value Date   NA 132* 01/05/2016   K 3.6 01/05/2016   CL 97* 01/05/2016   CO2 27 01/05/2016   GLUCOSE 126* 01/05/2016   BUN 9 01/05/2016   CREATININE 0.93 01/05/2016   CALCIUM 8.9 01/05/2016   PROT 7.9 01/05/2016   ALBUMIN 3.8 01/05/2016   AST 24 01/05/2016   ALT 21 01/05/2016   ALKPHOS 83 01/05/2016   BILITOT 0.7 01/05/2016   GFRNONAA >60 01/05/2016   GFRAA >60 01/05/2016    Lab Results  Component Value Date   WBC 1.7* 01/05/2016   NEUTROABS 0.5* 01/05/2016   HGB 10.2* 01/05/2016   HCT 30.0* 01/05/2016   MCV 82.8 01/05/2016   PLT 189 01/05/2016     STUDIES: Dg Chest Port 1 View  12/23/2015  CLINICAL DATA:  Catheter placement EXAM: PORTABLE CHEST 1 VIEW COMPARISON:  12/18/2010 chest radiograph. FINDINGS: Left subclavian MediPort terminates  in the high right atrium near the cavoatrial junction. There is abnormal focal narrowing of the left subclavian MediPort catheter between the medial left clavicle and anterior left first rib. Stable cardiomediastinal silhouette with mild cardiomegaly. No pneumothorax. No pleural effusion. Lungs appear clear, with no acute consolidative airspace disease and no pulmonary edema. IMPRESSION: 1. Abnormal focal narrowing of the left subclavian MediPort catheter between the medial left clavicle and anterior left first rib, which is a risk factor for catheter pinch-off. 2. No pneumothorax. 3. Stable mild cardiomegaly with no pulmonary edema. These results were called by telephone at the time of interpretation on 12/23/2015 at 12:36 pm to RN [facility], who verbally acknowledged these results. Electronically Signed   By: Ilona Sorrel M.D.   On: 12/23/2015 12:49   Dg C-arm 1-60 Min  12/23/2015  CLINICAL DATA:  Port placement EXAM: DG C-ARM 61-120 MIN COMPARISON:  None. FINDINGS: There is a left subclavian porta catheter with tip at the SVC or  upper right atrial level. No flow limiting kink. IMPRESSION: Left subclavian porta catheter with tip at the lower SVC or upper right atrium. Electronically Signed   By: Monte Fantasia M.D.   On: 12/23/2015 11:28    ASSESSMENT: Pathologic stage IIB ER/PR positive invasive lobular carcinoma of the right breast.  PLAN:    1. Breast cancer: Patient is now status post mastectomy. Surgical pathology results as well as PET scan results reviewed independently confirming stage IIB disease. Patient tolerated cycle 1 of 4 of Taxotere and Cytoxan last week without significant side effects. She will require XRT at the conclusion of her treatments. Finally, patient will benefit from an aromatase inhibitor for 5 years at the conclusion of her treatments. Return to clinic in 2 weeks for consideration of cycle 2. 2. Wound infection: Resolved, monitor closely while on chemotherapy. 3. Leukopenia: Secondary to chemotherapy. Continue Neulasta as above. 4. Leg pain: Secondary to Neulasta, continue Tylenol and Claritin as needed. 5. Hemorrhoids: Patient was called in a prescription for Anusol cream.   Patient expressed understanding and was in agreement with this plan. She also understands that She can call clinic at any time with any questions, concerns, or complaints.   Invasive lobular carcinoma of right breast, stage 2 ([place])   Staging form: Breast, AJCC 7th Edition     Pathologic stage: Stage IIB (T2, N1a, M0) - Signed by Lloyd Huger, MD on 10/22/2015   Lloyd Huger, MD   01/05/2016 12:50 PM

## 2016-01-07 ENCOUNTER — Encounter: Payer: Self-pay | Admitting: Surgery

## 2016-01-07 ENCOUNTER — Telehealth: Payer: Self-pay | Admitting: *Deleted

## 2016-01-07 ENCOUNTER — Ambulatory Visit (INDEPENDENT_AMBULATORY_CARE_PROVIDER_SITE_OTHER): Payer: BLUE CROSS/BLUE SHIELD | Admitting: Surgery

## 2016-01-07 VITALS — BP 148/89 | HR 91 | Temp 98.3°F | Wt 288.0 lb

## 2016-01-07 DIAGNOSIS — C50911 Malignant neoplasm of unspecified site of right female breast: Secondary | ICD-10-CM

## 2016-01-07 DIAGNOSIS — T814XXD Infection following a procedure, subsequent encounter: Secondary | ICD-10-CM

## 2016-01-07 DIAGNOSIS — IMO0001 Reserved for inherently not codable concepts without codable children: Secondary | ICD-10-CM

## 2016-01-07 DIAGNOSIS — K644 Residual hemorrhoidal skin tags: Secondary | ICD-10-CM

## 2016-01-07 DIAGNOSIS — Z9013 Acquired absence of bilateral breasts and nipples: Secondary | ICD-10-CM

## 2016-01-07 MED ORDER — PRAMOXINE-HC 1-1 % EX FOAM
Freq: Three times a day (TID) | CUTANEOUS | Status: DC
Start: 2016-01-07 — End: 2016-01-12

## 2016-01-07 NOTE — Telephone Encounter (Signed)
States the rx she was given for her hemorroids is not helping much and is asking if we can order Epifoam if her insurance will cover it.

## 2016-01-07 NOTE — Progress Notes (Signed)
Outpatient postop visit  01/07/2016  Kaylee Reyes is an 65 y.o. female.    Procedure status post bilateral mastectomy  CC: Patient called this morning worried about infection and she is on chemotherapy at this point. I asked her to come to the office to be evaluated.  HPI: Patient describes drainage and states that her daughter found a "hole" in the right sided mastectomy wound. She denies fevers or chills or increased pain and has been using Vaseline gauze dressing.  Medications reviewed.    Physical Exam:  BP 148/89 mmHg  Pulse 91  Temp(Src) 98.3 F (36.8 C) (Oral)  Wt 288 lb (130.636 kg)    PE: Bilateral mastectomy sites which are open and granulating without erythema or drainage the RN and I both looked closely at both wounds and I found no "hole" to explain the patient's complaints. At this point I see no sign of infection.    Assessment/Plan:  No sign of infection granulating well no "hole" and no area of drainage. Silver nitrate is applied to both right and left mastectomy site wounds which are healing well with secondary granulation tissue. Follow-up next week with Dr. Ashok Croon, MD, FACS

## 2016-01-07 NOTE — Telephone Encounter (Signed)
OK to order per Dr Grayland Ormond, pt informed rx sent to pharmacy, but will prob need a PA

## 2016-01-07 NOTE — Telephone Encounter (Signed)
P/A submitted 01/07/16 @ 11:39./Heather S

## 2016-01-09 ENCOUNTER — Telehealth: Payer: Self-pay | Admitting: Oncology

## 2016-01-09 NOTE — Telephone Encounter (Signed)
Prior authorization was sent this week, we are waiting to hear back from insurance company.

## 2016-01-09 NOTE — Telephone Encounter (Signed)
She said she's been waiting a few days to get her epifoam or epicream (was unsure of name) Rx from [facility] and they told her they are still waiting on something from the doctor. She wanted to ask if you could please take care of it because she really needs the cream. Thanks.

## 2016-01-12 ENCOUNTER — Telehealth: Payer: Self-pay | Admitting: *Deleted

## 2016-01-12 ENCOUNTER — Telehealth: Payer: Self-pay

## 2016-01-12 ENCOUNTER — Other Ambulatory Visit: Payer: Self-pay | Admitting: Family Medicine

## 2016-01-12 MED ORDER — HYDROCORTISONE ACE-PRAMOXINE 2.5-1 % RE CREA: TOPICAL_CREAM | RECTAL | Status: DC

## 2016-01-12 MED ORDER — LEVOTHYROXINE SODIUM 75 MCG PO TABS: 75.0000 ug | ORAL_TABLET | Freq: Every day | ORAL | Status: DC

## 2016-01-12 NOTE — Telephone Encounter (Signed)
The Epifoam has been denied per [employer]

## 2016-01-12 NOTE — Telephone Encounter (Signed)
Analpram e scribed per VO Dr Grayland Ormond Patient informed of rx being sent to pharmacy. We discussed trying a sitz bath

## 2016-01-12 NOTE — Telephone Encounter (Signed)
Pt needs refill on levothyroxine (SYNTHROID, LEVOTHROID) 75 MCG tablet  but her insurance changed her pharmacy to [facility].  Her call back is [phone]  Thanks, Con Memos

## 2016-01-12 NOTE — Telephone Encounter (Signed)
Awaiting to hear if her Epifoam has been approved. She states the hydrocortisone is NOT helping and she cannot continue to go on like this. She is using Miralax so that she does not get constipated and she is not having liquid stools, but just going at all is irritating her hemorrhoids

## 2016-01-12 NOTE — Telephone Encounter (Signed)
The Epifoam was denied by insurance and is wanting something else sent in to pharmacy.

## 2016-01-15 ENCOUNTER — Telehealth: Payer: Self-pay | Admitting: *Deleted

## 2016-01-15 ENCOUNTER — Encounter: Payer: Self-pay | Admitting: Surgery

## 2016-01-15 ENCOUNTER — Ambulatory Visit (INDEPENDENT_AMBULATORY_CARE_PROVIDER_SITE_OTHER): Payer: BLUE CROSS/BLUE SHIELD | Admitting: Surgery

## 2016-01-15 VITALS — BP 165/89 | HR 88 | Temp 99.2°F | Ht 66.0 in | Wt 298.3 lb

## 2016-01-15 DIAGNOSIS — C50911 Malignant neoplasm of unspecified site of right female breast: Secondary | ICD-10-CM

## 2016-01-15 NOTE — Telephone Encounter (Signed)
Per Dr Grayland Ormond, use OTC med to treat unless she becomes fevered. Patient informed of this and repeated back to me

## 2016-01-15 NOTE — Telephone Encounter (Signed)
Awoke this morning with a deep cough, Denies fever (T 98) or sob, just a nagging deep cough when she tries to get up and move. State she does bring up phlegm, but swallows it back down. She did take a claritin this morning, but asking if she needs anything else since she is to get chemo Monday

## 2016-01-15 NOTE — Progress Notes (Signed)
65 year old with invasive lobular cancer of the right breast status post bilateral mastectomy with skin necrosis. Patient is healing well she does have granulation tissue over the areas. She has started chemotherapy is doing well with that she's had some sores in her mouth. Patient's eating and drinking well and otherwise hasn't had an upset stomach. She does have a cough right now and is using Claritin and will use some Mucinex to help. She denies any fever chills nausea vomiting or diarrhea.  Filed Vitals:   01/15/16 1634  BP: 165/89  Pulse: 88  Temp: 99.2 F (37.3 C)   PE:  Gen:NAD Chest wall:  Areas with hypergranulation tissue, all smaller than previously, treated with silver nitrate  A/P:   Areas of hyper granulation tissue over the previous necrotic areas. She's feeling well and seems to be handling chemotherapy well as well. Dressings to dry dressings only and twice a day keep the area clean and dry. We will have her return to clinic in 4 weeks.

## 2016-01-19 ENCOUNTER — Inpatient Hospital Stay: Payer: BLUE CROSS/BLUE SHIELD | Attending: Oncology

## 2016-01-19 ENCOUNTER — Other Ambulatory Visit: Payer: Self-pay | Admitting: Family Medicine

## 2016-01-19 ENCOUNTER — Inpatient Hospital Stay: Payer: BLUE CROSS/BLUE SHIELD

## 2016-01-19 ENCOUNTER — Inpatient Hospital Stay (HOSPITAL_BASED_OUTPATIENT_CLINIC_OR_DEPARTMENT_OTHER): Payer: BLUE CROSS/BLUE SHIELD | Admitting: Oncology

## 2016-01-19 VITALS — BP 171/78 | HR 84 | Temp 98.2°F | Resp 16 | Wt 280.0 lb

## 2016-01-19 DIAGNOSIS — C50912 Malignant neoplasm of unspecified site of left female breast: Secondary | ICD-10-CM

## 2016-01-19 DIAGNOSIS — I10 Essential (primary) hypertension: Secondary | ICD-10-CM | POA: Diagnosis not present

## 2016-01-19 DIAGNOSIS — K649 Unspecified hemorrhoids: Secondary | ICD-10-CM | POA: Insufficient documentation

## 2016-01-19 DIAGNOSIS — E876 Hypokalemia: Secondary | ICD-10-CM | POA: Diagnosis not present

## 2016-01-19 DIAGNOSIS — Z79899 Other long term (current) drug therapy: Secondary | ICD-10-CM | POA: Diagnosis not present

## 2016-01-19 DIAGNOSIS — C50911 Malignant neoplasm of unspecified site of right female breast: Secondary | ICD-10-CM | POA: Insufficient documentation

## 2016-01-19 DIAGNOSIS — Z5111 Encounter for antineoplastic chemotherapy: Secondary | ICD-10-CM | POA: Insufficient documentation

## 2016-01-19 DIAGNOSIS — E785 Hyperlipidemia, unspecified: Secondary | ICD-10-CM | POA: Diagnosis not present

## 2016-01-19 DIAGNOSIS — Z170 Estrogen receptor positive status [ER+]: Secondary | ICD-10-CM

## 2016-01-19 DIAGNOSIS — K219 Gastro-esophageal reflux disease without esophagitis: Secondary | ICD-10-CM | POA: Diagnosis not present

## 2016-01-19 DIAGNOSIS — E039 Hypothyroidism, unspecified: Secondary | ICD-10-CM | POA: Insufficient documentation

## 2016-01-19 LAB — COMPREHENSIVE METABOLIC PANEL
ALBUMIN: 4 g/dL (ref 3.5–5.0)
ALT: 23 U/L (ref 14–54)
AST: 27 U/L (ref 15–41)
Alkaline Phosphatase: 79 U/L (ref 38–126)
Anion gap: 10 (ref 5–15)
BUN: 18 mg/dL (ref 6–20)
CHLORIDE: 99 mmol/L — AB (ref 101–111)
CO2: 24 mmol/L (ref 22–32)
CREATININE: 0.96 mg/dL (ref 0.44–1.00)
Calcium: 8.6 mg/dL — ABNORMAL LOW (ref 8.9–10.3)
GFR calc Af Amer: >60 mL/min (ref >60)
GLUCOSE: 150 mg/dL — AB (ref 65–99)
Potassium: 3 mmol/L — ABNORMAL LOW (ref 3.5–5.1)
Sodium: 133 mmol/L — ABNORMAL LOW (ref 135–145)
Total Bilirubin: 0.5 mg/dL (ref 0.3–1.2)
Total Protein: 8.1 g/dL (ref 6.5–8.1)

## 2016-01-19 LAB — CBC WITH DIFFERENTIAL/PLATELET
BASOS ABS: 0.1 10*3/uL (ref 0–0.1)
Basophils Relative: 1 %
Eosinophils Absolute: 0 10*3/uL (ref 0–0.7)
Eosinophils Relative: 0 %
HEMATOCRIT: 31.6 % — AB (ref 35.0–47.0)
Hemoglobin: 10.8 g/dL — ABNORMAL LOW (ref 12.0–16.0)
LYMPHS PCT: 17 %
Lymphs Abs: 1.1 10*3/uL (ref 1.0–3.6)
MCH: 28.2 pg (ref 26.0–34.0)
MCHC: 34.1 g/dL (ref 32.0–36.0)
MCV: 82.7 fL (ref 80.0–100.0)
Monocytes Absolute: 0.5 10*3/uL (ref 0.2–0.9)
Monocytes Relative: 7 %
NEUTROS ABS: 5.2 10*3/uL (ref 1.4–6.5)
Neutrophils Relative %: 75 %
PLATELETS: 220 10*3/uL (ref 150–440)
RBC: 3.83 MIL/uL (ref 3.80–5.20)
RDW: 17 % — ABNORMAL HIGH (ref 11.5–14.5)
WBC: 6.9 10*3/uL (ref 3.6–11.0)

## 2016-01-19 MED ORDER — SODIUM CHLORIDE 0.9 % IV SOLN
10.0000 mg | Freq: Once | INTRAVENOUS | Status: AC
  Administered 2016-01-19: 10 mg via INTRAVENOUS
  Filled 2016-01-19: qty 1

## 2016-01-19 MED ORDER — POTASSIUM CHLORIDE CRYS ER 20 MEQ PO TBCR: 20.0000 meq | EXTENDED_RELEASE_TABLET | Freq: Every day | ORAL | Status: DC

## 2016-01-19 MED ORDER — SODIUM CHLORIDE 0.9% FLUSH
10.0000 mL | INTRAVENOUS | Status: DC | PRN
  Administered 2016-01-19: 10 mL via INTRAVENOUS
  Filled 2016-01-19: qty 10

## 2016-01-19 MED ORDER — HEPARIN SOD (PORK) LOCK FLUSH 100 UNIT/ML IV SOLN
500.0000 [IU] | Freq: Once | INTRAVENOUS | Status: AC
  Administered 2016-01-19: 500 [IU] via INTRAVENOUS
  Filled 2016-01-19: qty 5

## 2016-01-19 MED ORDER — DOCETAXEL CHEMO INJECTION 160 MG/16ML: 75.0000 mg/m2 | Freq: Once | INTRAVENOUS | Status: DC

## 2016-01-19 MED ORDER — MECLIZINE HCL 25 MG PO TABS: ORAL_TABLET | ORAL | Status: DC

## 2016-01-19 MED ORDER — PALONOSETRON HCL INJECTION 0.25 MG/5ML
0.2500 mg | Freq: Once | INTRAVENOUS | Status: AC
  Administered 2016-01-19: 0.25 mg via INTRAVENOUS
  Filled 2016-01-19: qty 5

## 2016-01-19 MED ORDER — SODIUM CHLORIDE 0.9 % IV SOLN
75.0000 mg/m2 | Freq: Once | INTRAVENOUS | Status: AC
  Administered 2016-01-19: 180 mg via INTRAVENOUS
  Filled 2016-01-19: qty 18

## 2016-01-19 MED ORDER — SODIUM CHLORIDE 0.9 % IV SOLN
1500.0000 mg | Freq: Once | INTRAVENOUS | Status: AC
  Administered 2016-01-19: 1500 mg via INTRAVENOUS
  Filled 2016-01-19: qty 50

## 2016-01-19 MED ORDER — SODIUM CHLORIDE 0.9 % IV SOLN
Freq: Once | INTRAVENOUS | Status: AC
  Administered 2016-01-19: 11:00:00 via INTRAVENOUS
  Filled 2016-01-19: qty 1000

## 2016-01-19 NOTE — Progress Notes (Signed)
[place]  Telephone:(336) [phone] Fax:(336) [phone]  ID: Kaylee Reyes OB: 10/30/1951  MR#: [id number]  [id number]  Patient Care Team: Birdie Sons, MD as PCP - General (Family Medicine) [name], MD as Consulting Physician (Gastroenterology)  CHIEF COMPLAINT:  Chief Complaint  Patient presents with  . Breast Cancer    INTERVAL HISTORY: Patient returns to clinic today for further evaluation and consideration of cycle 2 of Taxotere and Cytoxan. Patient feels well today and is asymptomatic. She does have a cough today which she believes to be allergies. She had a fever 100.2 on Friday night and 100.4 on Saturday night but none before of since that time. She has no neurologic complaints. She denies any recent fevers or illnesses. She denies any pain. She has a good appetite and denies weight loss. She denies any chest pain or shortness of breath. She has no nausea, vomiting, constipation, or diarrhea. She has no urinary complaints. Patient offers no specific complaints today.  REVIEW OF SYSTEMS:   Review of Systems  Constitutional: Negative for fever and malaise/fatigue.  Respiratory: Positive for cough.   Cardiovascular: Negative.   Gastrointestinal: Negative.   Genitourinary: Negative.   Musculoskeletal: Negative.   Neurological: Negative.  Negative for weakness.  Psychiatric/Behavioral: Negative.     As per HPI. Otherwise, a complete review of systems is negatve.  PAST MEDICAL HISTORY: Past Medical History  Diagnosis Date  . Hypertension   . GERD (gastroesophageal reflux disease)   . Thyroid disease   . Hyperlipidemia   . History of chicken pox   . Hypothyroidism   . Cancer [facility])     breast cancer    PAST SURGICAL HISTORY: Past Surgical History  Procedure Laterality Date  . Cholecystectomy  1990  . Wrist surgery    . Cesarean section  1988  . Exploratory laparotomy      Left breast  . Abdominal hysterectomy  07/13/2013   Total. with BSO for postmenopausal bleeding and fibroids with large cervical polyp. Dr. Levy Sjogren and Dr. Glennon Mac at [facility]  . Breast surgery  2011    BREAST BIOPSY  . Abdominal ultrasound  04/17/2011    Hepatomegaly with borderline slenomegaly. Suggestive of fatty liver. s/p cholecystectomy. Portions of aorta obscured  . Mastectomy w/ sentinel node biopsy Bilateral 10/28/2015    Procedure: MASTECTOMY WITH SENTINEL LYMPH NODE BIOPSY;  Surgeon: Hubbard Robinson, MD;  Location: [facility] ORS;  Service: General;  Laterality: Bilateral;  . Portacath placement Left 12/23/2015    Procedure: INSERTION PORT-A-CATH;  Surgeon: Hubbard Robinson, MD;  Location: [facility] ORS;  Service: General;  Laterality: Left;    FAMILY HISTORY Family History  Problem Relation Age of Onset  . Hypertension Mother   . Thyroid disease Mother   . Hypertension Brother   . Diabetes Brother        ADVANCED DIRECTIVES:    HEALTH MAINTENANCE: Social History  Substance Use Topics  . Smoking status: Never Smoker   . Smokeless tobacco: Never Used  . Alcohol Use: No     Colonoscopy:  PAP:  Bone density:  Lipid panel:  Allergies  Allergen Reactions  . Penicillins Shortness Of Breath  . Nickel Hives  . Pravastatin Sodium     Muscle pain, muscle cramps, muscle weakness    Current Outpatient Prescriptions  Medication Sig Dispense Refill  . atenolol (TENORMIN) 50 MG tablet Take 1 tablet (50 mg total) by mouth daily. 90 tablet 4  . cyclobenzaprine (FLEXERIL) 5 MG tablet Take  1 tablet (5 mg total) by mouth 3 (three) times daily as needed for muscle spasms. 60 tablet 1  . docusate sodium (COLACE) 100 MG capsule Take 100 mg by mouth daily.    . fexofenadine (ALLEGRA) 180 MG tablet Take 180 mg by mouth daily.    . hydrochlorothiazide (HYDRODIURIL) 25 MG tablet Take 1 tablet by mouth daily.    . hydrocortisone-pramoxine (ANALPRAM-HC) 2.5-1 % rectal cream Use a thin film on affected area 4 times a day as needed 30 g 1  .  ketoconazole (NIZORAL) 2 % cream Apply 1 application topically daily. 50 g 0  . lansoprazole (PREVACID) 15 MG capsule Take 15 mg by mouth daily at 12 noon.    Marland Kitchen levothyroxine (SYNTHROID, LEVOTHROID) 75 MCG tablet Take 1 tablet (75 mcg total) by mouth daily. 30 tablet 5  . lidocaine-prilocaine (EMLA) cream Apply to affected area once 30 g 3  . magic mouthwash SOLN RINSE AND SPIT ONE TEASPOONFUL BY MOUTH EVERY 3 TO 4 HOURS AS NEEDED 100 mL 1  . ondansetron (ZOFRAN) 8 MG tablet Take 1 tablet (8 mg total) by mouth 2 (two) times daily as needed for refractory nausea / vomiting. Start on day 3 after chemo. 30 tablet 1  . PERCOCET 5-325 MG tablet Take 1-2 tablets by mouth every 4 (four) hours as needed for severe pain. 60 tablet 0  . prochlorperazine (COMPAZINE) 10 MG tablet Take 1 tablet (10 mg total) by mouth every 6 (six) hours as needed (Nausea or vomiting). 30 tablet 1  . meclizine (ANTIVERT) 25 MG tablet TAKE ONE TABLET BY MOUTH EVERY 4 TO 6 HOURS AS NEEDED 30 tablet 3  . potassium chloride SA (K-DUR,KLOR-CON) 20 MEQ tablet Take 1 tablet (20 mEq total) by mouth daily. 30 tablet 2   No current facility-administered medications for this visit.   Facility-Administered Medications Ordered in Other Visits  Medication Dose Route Frequency Provider Last Rate Last Dose  . cyclophosphamide (CYTOXAN) 1,500 mg in sodium chloride 0.9 % 250 mL chemo infusion  1,500 mg Intravenous Once Lloyd Huger, MD      . heparin lock flush 100 unit/mL  500 Units Intravenous Once Lloyd Huger, MD      . sodium chloride flush (NS) 0.9 % injection 10 mL  10 mL Intravenous PRN Lloyd Huger, MD   10 mL at 01/19/16 0910    OBJECTIVE: Filed Vitals:   01/19/16 0937  BP: 171/78  Pulse: 84  Temp: 98.2 F (36.8 C)  Resp: 16     Body mass index is 45.21 kg/(m^2).    ECOG FS:0 - Asymptomatic  General: Well-developed, well-nourished, no acute distress. Eyes: Pink conjunctiva, anicteric sclera. Breasts:  Surgical scar on right breast without erythema. Lungs: Clear to auscultation bilaterally. Heart: Regular rate and rhythm. No rubs, murmurs, or gallops. Abdomen: Soft, nontender, nondistended. No organomegaly noted, normoactive bowel sounds. Musculoskeletal: No edema, cyanosis, or clubbing. Neuro: Alert, answering all questions appropriately. Cranial nerves grossly intact. Skin: No rashes or petechiae noted. Psych: Normal affect.   LAB RESULTS:  Lab Results  Component Value Date   NA 133* 01/19/2016   K 3.0* 01/19/2016   CL 99* 01/19/2016   CO2 24 01/19/2016   GLUCOSE 150* 01/19/2016   BUN 18 01/19/2016   CREATININE 0.96 01/19/2016   CALCIUM 8.6* 01/19/2016   PROT 8.1 01/19/2016   ALBUMIN 4.0 01/19/2016   AST 27 01/19/2016   ALT 23 01/19/2016   ALKPHOS 79 01/19/2016   BILITOT  0.5 01/19/2016   GFRNONAA >60 01/19/2016   GFRAA >60 01/19/2016    Lab Results  Component Value Date   WBC 6.9 01/19/2016   NEUTROABS 5.2 01/19/2016   HGB 10.8* 01/19/2016   HCT 31.6* 01/19/2016   MCV 82.7 01/19/2016   PLT 220 01/19/2016     STUDIES: Dg Chest Port 1 View  12/23/2015  CLINICAL DATA:  Catheter placement EXAM: PORTABLE CHEST 1 VIEW COMPARISON:  12/18/2010 chest radiograph. FINDINGS: Left subclavian MediPort terminates in the high right atrium near the cavoatrial junction. There is abnormal focal narrowing of the left subclavian MediPort catheter between the medial left clavicle and anterior left first rib. Stable cardiomediastinal silhouette with mild cardiomegaly. No pneumothorax. No pleural effusion. Lungs appear clear, with no acute consolidative airspace disease and no pulmonary edema. IMPRESSION: 1. Abnormal focal narrowing of the left subclavian MediPort catheter between the medial left clavicle and anterior left first rib, which is a risk factor for catheter pinch-off. 2. No pneumothorax. 3. Stable mild cardiomegaly with no pulmonary edema. These results were called by telephone  at the time of interpretation on 12/23/2015 at 12:36 pm to RN [facility], who verbally acknowledged these results. Electronically Signed   By: Ilona Sorrel M.D.   On: 12/23/2015 12:49   Dg C-arm 1-60 Min  12/23/2015  CLINICAL DATA:  Port placement EXAM: DG C-ARM 61-120 MIN COMPARISON:  None. FINDINGS: There is a left subclavian porta catheter with tip at the SVC or upper right atrial level. No flow limiting kink. IMPRESSION: Left subclavian porta catheter with tip at the lower SVC or upper right atrium. Electronically Signed   By: Monte Fantasia M.D.   On: 12/23/2015 11:28    ASSESSMENT: Pathologic stage IIB ER/PR positive invasive lobular carcinoma of the right breast.  PLAN:    1. Breast cancer: Patient is now status post mastectomy. Surgical pathology results as well as PET scan results reviewed independently confirming stage IIB disease. Proceed with cycle 2 of 4 of Taxotere and Cytoxan today. She will require XRT at the conclusion of her treatments. Finally, patient will benefit from an aromatase inhibitor for 5 years at the conclusion of her treatments. Return to clinic in two days for nuelasta injection and then in 3 weeks for consideration of cycle 3. 2. Wound infection: Resolved, monitor closely while on chemotherapy. 3. Leukopenia: Resolved. Secondary to chemotherapy. Continue Neulasta as above. 4. Leg pain: Resolved. Possibly secondary to Neulasta, continue Tylenol and Claritin as needed. 5. Hemorrhoids: Improved. 6. Hypokalemia: Potassium 20 meq daily sent to patients pharmacy.   Patient expressed understanding and was in agreement with this plan. She also understands that She can call clinic at any time with any questions, concerns, or complaints.   Invasive lobular carcinoma of right breast, stage 2 ([place])   Staging form: Breast, AJCC 7th Edition     Pathologic stage: Stage IIB (T2, N1a, M0) - Signed by Lloyd Huger, MD on 10/22/2015   Mayra Reel, NP   01/19/2016 12:41  PM  Patient was seen and evaluated and I agree with the assessment and plan as dictated above.  Lloyd Huger, MD 01/25/2016 6:54 AM

## 2016-01-19 NOTE — Telephone Encounter (Signed)
Pt contacted office for refill request on the following medications: meclizine (ANTIVERT) 25 MG tablet to [facility] in [place]. Thanks TNP

## 2016-01-19 NOTE — Progress Notes (Signed)
Patient is having productive cough and was taking Mucinex but it made her cough even more.  Saturday night had a temp on 100.4 and woke up in a sweat.  She has found that warm compresses has helped the hemorrhoids.  Also having some nausea that is helped with Meclizine.

## 2016-01-20 ENCOUNTER — Inpatient Hospital Stay: Payer: BLUE CROSS/BLUE SHIELD

## 2016-01-20 DIAGNOSIS — C50911 Malignant neoplasm of unspecified site of right female breast: Secondary | ICD-10-CM | POA: Diagnosis not present

## 2016-01-20 DIAGNOSIS — C50912 Malignant neoplasm of unspecified site of left female breast: Secondary | ICD-10-CM

## 2016-01-20 MED ORDER — PEGFILGRASTIM INJECTION 6 MG/0.6ML ~~LOC~~
6.0000 mg | PREFILLED_SYRINGE | Freq: Once | SUBCUTANEOUS | Status: AC
  Administered 2016-01-20: 6 mg via SUBCUTANEOUS
  Filled 2016-01-20: qty 0.6

## 2016-01-21 ENCOUNTER — Telehealth: Payer: Self-pay | Admitting: *Deleted

## 2016-01-21 ENCOUNTER — Other Ambulatory Visit: Payer: Self-pay | Admitting: Oncology

## 2016-01-21 NOTE — Telephone Encounter (Signed)
Continue to use OTC meds, No abx to be ordered at this time. Patient informed and will continue Mucinex adn try a different cough med

## 2016-01-21 NOTE — Telephone Encounter (Signed)
Patient has compazine listed in her meds.  Is she taking that?  Can try phenergan if neither is working. Thanks.

## 2016-01-21 NOTE — Telephone Encounter (Signed)
Contacted pt adn advised to try her Compazine. She asked how to spell it adn I spelled prochlorperazine for her . She said she will have to look for it

## 2016-01-21 NOTE — Telephone Encounter (Signed)
Cough is back worse again, Robitussin and Mucinex were stopped because she was better from last week, but she restarted it yesterday when the cough came back. States she is coughing up mucous, but does not know what color it is as it was in the middle of the night. She feels congestion is going down into her chest. She had chemo yesterday. Temperature this morning is 99.

## 2016-01-21 NOTE — Telephone Encounter (Signed)
Having nausea which is getting worse as the day progresses, Zofran not helping

## 2016-01-25 ENCOUNTER — Other Ambulatory Visit: Payer: Self-pay

## 2016-01-26 ENCOUNTER — Telehealth: Payer: Self-pay | Admitting: *Deleted

## 2016-01-26 NOTE — Telephone Encounter (Signed)
Having a lot of nausea but is controlled with ondansetron, feeling extremely drained tired, reports she is eating and drinking just fine, but mentioned possible IVF to see if that would help, states she has not lost any weight. Had some diarrhea (now smooshy stools) which has aggravated her hemorrhoids and nothing she has tried is helping adn she is asking about Epifoam again. I suggested she have her pharmacy try to run the last rx again and see if it will go through now that she has tried the other meds and failed. I suggested that she try sitz bath but she claims that she gets numbness in her legs when she sits on the toilet for the time it takes to do the sitz. She sates she cannot get in and out of a tub to soak. Reports that she has had some mild mouth sores, but they are not bad.

## 2016-01-26 NOTE — Telephone Encounter (Signed)
Per Dr Grayland Ormond, no IVF pt informed and she stated that she did go do a sitz bath after we spoke and it seems to feel a little bit better. She has also called her pharmacy to try to get the Epifoam again

## 2016-01-28 ENCOUNTER — Encounter: Payer: Self-pay | Admitting: Surgery

## 2016-01-28 ENCOUNTER — Ambulatory Visit (INDEPENDENT_AMBULATORY_CARE_PROVIDER_SITE_OTHER): Payer: BLUE CROSS/BLUE SHIELD | Admitting: Surgery

## 2016-01-28 VITALS — BP 148/82 | HR 89 | Temp 98.1°F | Ht 66.0 in | Wt 280.0 lb

## 2016-01-28 DIAGNOSIS — Z9013 Acquired absence of bilateral breasts and nipples: Secondary | ICD-10-CM

## 2016-01-28 DIAGNOSIS — C50911 Malignant neoplasm of unspecified site of right female breast: Secondary | ICD-10-CM

## 2016-01-28 MED ORDER — AZITHROMYCIN 250 MG PO TABS
ORAL_TABLET | ORAL | Status: AC
Start: 2016-01-28 — End: 2016-02-02

## 2016-01-28 NOTE — Progress Notes (Signed)
65 year old status post bilateral mastectomy was some skin necrosis for left breast lobular carcinoma. Patient is undergoing chemotherapy and has gone through her second treatment. Sensation states that she is just been feeling fatigued and tired and weak. Patient has also had 3 weeks of coughing nasal sinus drainage malaise and shortness of breath. Patient states that she has not had any antibiotics for this at this time patient does deny a fever but states that she's coughing up some yellow to white colored phlegm. Patient states that there is some drainage from her wounds that is minimal and she is continuing to deep dry bandages on them.  Filed Vitals:   01/28/16 0954  BP: 148/82  Pulse: 89  Temp: 98.1 F (36.7 C)   Review of Systems - General ROS: positive for  - chills, malaise and weight loss ENT ROS: positive for - headaches, nasal congestion, nasal discharge, sinus pain, sore throat and vocal changes Hematological and Lymphatic ROS: positive for - bruising, fatigue and pallor Breast ROS: positive for - drainage from wounds Respiratory ROS: no cough, shortness of breath, or wheezing Gastrointestinal ROS: no abdominal pain, change in bowel habits, or black or bloody stools Musculoskeletal ROS: positive for - muscle pain and muscular weakness Neurological ROS: negative for - dizziness, memory loss, numbness/tingling or visual changes   PE:  Gen: mild distress, appears tired and older than stated age Cardio: RRR, no murmur or gallop Res: Rale and rhonchi in bilateral lungs, worse in lower fields, some clearing with cough but not fully Abd: soft, non-tender, non-distended Breast: incision sites healing with good granulation tissue, both sides without erythema, normal serous drainage on bandages, silver nitrate applied Ext: 2+ pulses, 1+ edema  A/P:   Left breast cancer:  silver nitrate to her wounds this seems to be improving continue with dry dressings she does not need to follow-up  with me from a wound standpoint. We will need to follow up with me once  In about 4 months for her wound check and chest wall evaluation.  Acute upper respiratory infection:  Given the duration of time of [redacted] weeks along with the yellowish phlegm that she's been coughing up and her other symptoms we'll start her on a Z-Pak and see if she can clear the symptoms.

## 2016-01-28 NOTE — Patient Instructions (Signed)
Continue taking your Mucinex but increase your water intake as much as possible.  I have sent in your antibiotics to your pharmacy. Take your antibiotics until they are completely gone.  You do not require a follow-up at this time, your incisions look great. Please call with any questions or concerns.

## 2016-01-29 ENCOUNTER — Telehealth: Payer: Self-pay | Admitting: Surgery

## 2016-01-29 NOTE — Telephone Encounter (Signed)
Called patient back to let her know that it was safe for her to take her Z-Pack. Since patient had a hoarse voice, I recommended for her to start drinking warm tea or warm beverages. Patient stated that she started her Z-Pack this AM and that she would start drinking warm beverages to help with her hoarseness. I told patient that if she had any questions that we are here to help.

## 2016-01-29 NOTE — Telephone Encounter (Signed)
Kaylee Reyes saw Dr Azalee Course in the [facility] office yesterday 01/28/16. Dr Azalee Course prescribed her an antibiotic (Z-Pak) for an acute upper respiratory infection that she has had for several weeks. She has not taken the Z-Pak yet. She would like to speak with the nurse to make sure we have a complete, updated list of all the medications she takes and that there will no interactions when starting the Z-Pak. Please call and advise. Thank you.

## 2016-02-02 ENCOUNTER — Ambulatory Visit
Admission: RE | Admit: 2016-02-02 | Discharge: 2016-02-02 | Disposition: A | Discharge status: Home / Self Care | Payer: BLUE CROSS/BLUE SHIELD | Source: Ambulatory Visit | Attending: Oncology | Admitting: Oncology

## 2016-02-02 ENCOUNTER — Telehealth: Payer: Self-pay | Admitting: *Deleted

## 2016-02-02 DIAGNOSIS — C50912 Malignant neoplasm of unspecified site of left female breast: Secondary | ICD-10-CM

## 2016-02-02 DIAGNOSIS — R05 Cough: Secondary | ICD-10-CM | POA: Insufficient documentation

## 2016-02-02 DIAGNOSIS — R059 Cough, unspecified: Secondary | ICD-10-CM

## 2016-02-02 NOTE — Telephone Encounter (Signed)
CXR order entered patietn notified and she was given directions on where to go for CXR. She thanked me.

## 2016-02-02 NOTE — Telephone Encounter (Signed)
Called to request that a CXR be ordered for the cough she has had for a month to see what is going on

## 2016-02-09 ENCOUNTER — Inpatient Hospital Stay (HOSPITAL_BASED_OUTPATIENT_CLINIC_OR_DEPARTMENT_OTHER): Payer: BLUE CROSS/BLUE SHIELD | Admitting: Oncology

## 2016-02-09 ENCOUNTER — Inpatient Hospital Stay: Payer: BLUE CROSS/BLUE SHIELD

## 2016-02-09 ENCOUNTER — Inpatient Hospital Stay: Payer: BLUE CROSS/BLUE SHIELD | Attending: Oncology

## 2016-02-09 VITALS — BP 144/83 | HR 84 | Temp 98.1°F | Resp 16 | Wt 291.4 lb

## 2016-02-09 DIAGNOSIS — E039 Hypothyroidism, unspecified: Secondary | ICD-10-CM | POA: Insufficient documentation

## 2016-02-09 DIAGNOSIS — E876 Hypokalemia: Secondary | ICD-10-CM | POA: Diagnosis not present

## 2016-02-09 DIAGNOSIS — Z79899 Other long term (current) drug therapy: Secondary | ICD-10-CM | POA: Insufficient documentation

## 2016-02-09 DIAGNOSIS — R05 Cough: Secondary | ICD-10-CM

## 2016-02-09 DIAGNOSIS — Z7689 Persons encountering health services in other specified circumstances: Secondary | ICD-10-CM | POA: Insufficient documentation

## 2016-02-09 DIAGNOSIS — D649 Anemia, unspecified: Secondary | ICD-10-CM | POA: Diagnosis not present

## 2016-02-09 DIAGNOSIS — C50912 Malignant neoplasm of unspecified site of left female breast: Secondary | ICD-10-CM

## 2016-02-09 DIAGNOSIS — Z9013 Acquired absence of bilateral breasts and nipples: Secondary | ICD-10-CM | POA: Diagnosis not present

## 2016-02-09 DIAGNOSIS — Z170 Estrogen receptor positive status [ER+]: Secondary | ICD-10-CM

## 2016-02-09 DIAGNOSIS — K219 Gastro-esophageal reflux disease without esophagitis: Secondary | ICD-10-CM | POA: Insufficient documentation

## 2016-02-09 DIAGNOSIS — R21 Rash and other nonspecific skin eruption: Secondary | ICD-10-CM | POA: Diagnosis not present

## 2016-02-09 DIAGNOSIS — C50911 Malignant neoplasm of unspecified site of right female breast: Secondary | ICD-10-CM

## 2016-02-09 DIAGNOSIS — E785 Hyperlipidemia, unspecified: Secondary | ICD-10-CM | POA: Insufficient documentation

## 2016-02-09 DIAGNOSIS — Z5111 Encounter for antineoplastic chemotherapy: Secondary | ICD-10-CM | POA: Insufficient documentation

## 2016-02-09 DIAGNOSIS — I10 Essential (primary) hypertension: Secondary | ICD-10-CM | POA: Insufficient documentation

## 2016-02-09 LAB — COMPREHENSIVE METABOLIC PANEL
ALT: 12 U/L — ABNORMAL LOW (ref 14–54)
ANION GAP: 8 (ref 5–15)
AST: 15 U/L (ref 15–41)
Albumin: 3.7 g/dL (ref 3.5–5.0)
Alkaline Phosphatase: 65 U/L (ref 38–126)
BILIRUBIN TOTAL: 0.3 mg/dL (ref 0.3–1.2)
BUN: 13 mg/dL (ref 6–20)
CHLORIDE: 101 mmol/L (ref 101–111)
CO2: 24 mmol/L (ref 22–32)
Calcium: 8.8 mg/dL — ABNORMAL LOW (ref 8.9–10.3)
Creatinine, Ser: 0.82 mg/dL (ref 0.44–1.00)
Glucose, Bld: 122 mg/dL — ABNORMAL HIGH (ref 65–99)
POTASSIUM: 3.6 mmol/L (ref 3.5–5.1)
Sodium: 133 mmol/L — ABNORMAL LOW (ref 135–145)
TOTAL PROTEIN: 7.7 g/dL (ref 6.5–8.1)

## 2016-02-09 LAB — IRON AND TIBC
Iron: 48 ug/dL (ref 28–170)
SATURATION RATIOS: 15 % (ref 10.4–31.8)
TIBC: 326 ug/dL (ref 250–450)
UIBC: 278 ug/dL

## 2016-02-09 LAB — CBC WITH DIFFERENTIAL/PLATELET
BASOS ABS: 0.1 10*3/uL (ref 0–0.1)
Basophils Relative: 1 %
EOS PCT: 0 %
Eosinophils Absolute: 0 10*3/uL (ref 0–0.7)
HEMATOCRIT: 28.5 % — AB (ref 35.0–47.0)
Hemoglobin: 9.6 g/dL — ABNORMAL LOW (ref 12.0–16.0)
LYMPHS PCT: 12 %
Lymphs Abs: 1 10*3/uL (ref 1.0–3.6)
MCH: 28.5 pg (ref 26.0–34.0)
MCHC: 33.5 g/dL (ref 32.0–36.0)
MCV: 84.9 fL (ref 80.0–100.0)
MONO ABS: 0.4 10*3/uL (ref 0.2–0.9)
MONOS PCT: 5 %
NEUTROS ABS: 6.9 10*3/uL — AB (ref 1.4–6.5)
Neutrophils Relative %: 82 %
PLATELETS: 177 10*3/uL (ref 150–440)
RBC: 3.36 MIL/uL — ABNORMAL LOW (ref 3.80–5.20)
RDW: 19.3 % — AB (ref 11.5–14.5)
WBC: 8.4 10*3/uL (ref 3.6–11.0)

## 2016-02-09 LAB — VITAMIN B12: VITAMIN B 12: 897 pg/mL (ref 180–914)

## 2016-02-09 LAB — FOLATE: Folate: 6.4 ng/mL (ref >5.9)

## 2016-02-09 MED ORDER — PALONOSETRON HCL INJECTION 0.25 MG/5ML
0.2500 mg | Freq: Once | INTRAVENOUS | Status: AC
  Administered 2016-02-09: 0.25 mg via INTRAVENOUS
  Filled 2016-02-09: qty 5

## 2016-02-09 MED ORDER — SODIUM CHLORIDE 0.9 % IV SOLN
Freq: Once | INTRAVENOUS | Status: AC
  Administered 2016-02-09: 11:00:00 via INTRAVENOUS
  Filled 2016-02-09: qty 1000

## 2016-02-09 MED ORDER — SODIUM CHLORIDE 0.9% FLUSH
10.0000 mL | INTRAVENOUS | Status: DC | PRN
  Administered 2016-02-09: 10 mL via INTRAVENOUS
  Filled 2016-02-09: qty 10

## 2016-02-09 MED ORDER — HEPARIN SOD (PORK) LOCK FLUSH 100 UNIT/ML IV SOLN
500.0000 [IU] | Freq: Once | INTRAVENOUS | Status: AC | PRN
  Administered 2016-02-09: 500 [IU]
  Filled 2016-02-09 (×2): qty 5

## 2016-02-09 MED ORDER — SODIUM CHLORIDE 0.9 % IV SOLN
10.0000 mg | Freq: Once | INTRAVENOUS | Status: AC
  Administered 2016-02-09: 10 mg via INTRAVENOUS
  Filled 2016-02-09: qty 1

## 2016-02-09 MED ORDER — DOCETAXEL CHEMO INJECTION 160 MG/16ML
180.0000 mg | Freq: Once | INTRAVENOUS | Status: AC
  Administered 2016-02-09: 180 mg via INTRAVENOUS
  Filled 2016-02-09: qty 18

## 2016-02-09 MED ORDER — SODIUM CHLORIDE 0.9 % IV SOLN
1500.0000 mg | Freq: Once | INTRAVENOUS | Status: AC
  Administered 2016-02-09: 1500 mg via INTRAVENOUS
  Filled 2016-02-09: qty 25

## 2016-02-09 NOTE — Progress Notes (Signed)
[place]  Telephone:([phone]) [phone] [phone]) [phone]  ID: Jaquita Folds OB: [birth year]  MR#: [id number]  [id number]  Patient Care Team: Birdie Sons, MD as PCP - General (Family Medicine) [name], MD as Consulting Physician (Gastroenterology)  CHIEF COMPLAINT:  Chief Complaint  Patient presents with  . Breast Cancer    INTERVAL HISTORY: Patient returns to clinic today for further evaluation and consideration of cycle 3 of Taxotere and Cytoxan. Patient feels well today and is asymptomatic. She does still have a cough today and it is very bothersome for her. She has to wear a maxi pad due to urinating when she coughs which is causing her irritation because she has an allergy to maxi pads.  She has no neurologic complaints. She denies any recent fevers or illnesses. She denies any pain. She has a good appetite and denies weight loss. She denies any chest pain or shortness of breath. She has no nausea, vomiting, constipation, or diarrhea. She has no urinary complaints. Patient offers no other specific complaints today.  REVIEW OF SYSTEMS:   Review of Systems  Constitutional: Positive for malaise/fatigue. Negative for fever.  Respiratory: Positive for cough.   Cardiovascular: Negative.   Gastrointestinal: Negative.   Genitourinary: Negative.   Musculoskeletal: Negative.   Skin: Positive for rash.  Neurological: Positive for weakness.  Psychiatric/Behavioral: Negative.     As per HPI. Otherwise, a complete review of systems is negatve.  PAST MEDICAL HISTORY: Past Medical History  Diagnosis Date  . Hypertension   . GERD (gastroesophageal reflux disease)   . Thyroid disease   . Hyperlipidemia   . History of chicken pox   . Hypothyroidism   . Cancer [facility])     breast cancer    PAST SURGICAL HISTORY: Past Surgical History  Procedure Laterality Date  . Cholecystectomy  1990  . Wrist surgery    . Cesarean section  1988  . Exploratory  laparotomy      Left breast  . Abdominal hysterectomy  07/13/2013    Total. with BSO for postmenopausal bleeding and fibroids with large cervical polyp. Dr. Levy Sjogren and Dr. Glennon Mac at [facility]  . Breast surgery  2011    BREAST BIOPSY  . Abdominal ultrasound  04/17/2011    Hepatomegaly with borderline slenomegaly. Suggestive of fatty liver. s/p cholecystectomy. Portions of aorta obscured  . Mastectomy w/ sentinel node biopsy Bilateral 10/28/2015    Procedure: MASTECTOMY WITH SENTINEL LYMPH NODE BIOPSY;  Surgeon: Hubbard Robinson, MD;  Location: [facility] ORS;  Service: General;  Laterality: Bilateral;  . Portacath placement Left 12/23/2015    Procedure: INSERTION PORT-A-CATH;  Surgeon: Hubbard Robinson, MD;  Location: [facility] ORS;  Service: General;  Laterality: Left;    FAMILY HISTORY Family History  Problem Relation Age of Onset  . Hypertension Mother   . Thyroid disease Mother   . Hypertension Brother   . Diabetes Brother        ADVANCED DIRECTIVES:    HEALTH MAINTENANCE: Social History  Substance Use Topics  . Smoking status: Never Smoker   . Smokeless tobacco: Never Used  . Alcohol Use: No     Colonoscopy:  PAP:  Bone density:  Lipid panel:  Allergies  Allergen Reactions  . Penicillins Shortness Of Breath  . Nickel Hives  . Pravastatin Sodium     Muscle pain, muscle cramps, muscle weakness    Current Outpatient Prescriptions  Medication Sig Dispense Refill  . atenolol (TENORMIN) 50 MG tablet Take 1 tablet (50  mg total) by mouth daily. 90 tablet 4  . cyclobenzaprine (FLEXERIL) 5 MG tablet Take 1 tablet (5 mg total) by mouth 3 (three) times daily as needed for muscle spasms. 60 tablet 1  . docusate sodium (COLACE) 100 MG capsule Take 100 mg by mouth daily.    . fexofenadine (ALLEGRA) 180 MG tablet Take 180 mg by mouth daily.    . hydrochlorothiazide (HYDRODIURIL) 25 MG tablet Take 1 tablet by mouth daily.    Marland Kitchen ketoconazole (NIZORAL) 2 % cream Apply 1 application  topically daily. 50 g 0  . lansoprazole (PREVACID) 15 MG capsule Take 15 mg by mouth daily at 12 noon.    Marland Kitchen levothyroxine (SYNTHROID, LEVOTHROID) 75 MCG tablet Take 1 tablet (75 mcg total) by mouth daily. 30 tablet 5  . lidocaine-prilocaine (EMLA) cream Apply to affected area once 30 g 3  . magic mouthwash SOLN RINSE AND SPIT ONE TEASPOONFUL BY MOUTH EVERY 3 TO 4 HOURS AS NEEDED 100 mL 1  . meclizine (ANTIVERT) 25 MG tablet TAKE ONE TABLET BY MOUTH EVERY 4 TO 6 HOURS AS NEEDED 30 tablet 3  . ondansetron (ZOFRAN) 8 MG tablet Take 1 tablet (8 mg total) by mouth 2 (two) times daily as needed for refractory nausea / vomiting. Start on day 3 after chemo. 30 tablet 1  . pramoxine-hydrocortisone 1-1 % foam Apply topically 3 (three) times daily. For hemorrhoids    . prochlorperazine (COMPAZINE) 10 MG tablet Take 1 tablet (10 mg total) by mouth every 6 (six) hours as needed (Nausea or vomiting). 30 tablet 1  . potassium chloride SA (K-DUR,KLOR-CON) 20 MEQ tablet Take 1 tablet (20 mEq total) by mouth daily. (Patient not taking: Reported on 02/09/2016) 30 tablet 2   No current facility-administered medications for this visit.   Facility-Administered Medications Ordered in Other Visits  Medication Dose Route Frequency Provider Last Rate Last Dose  . cyclophosphamide (CYTOXAN) 1,500 mg in sodium chloride 0.9 % 250 mL chemo infusion  1,500 mg Intravenous Once Lloyd Huger, MD      . dexamethasone (DECADRON) 10 mg in sodium chloride 0.9 % 50 mL IVPB  10 mg Intravenous Once Lloyd Huger, MD   10 mg at 02/09/16 1138  . DOCEtaxel (TAXOTERE) 180 mg in dextrose 5 % 250 mL chemo infusion  180 mg Intravenous Once Lloyd Huger, MD      . heparin lock flush 100 unit/mL  500 Units Intracatheter Once PRN Lloyd Huger, MD      . sodium chloride flush (NS) 0.9 % injection 10 mL  10 mL Intravenous PRN Lloyd Huger, MD   10 mL at 02/09/16 0930    OBJECTIVE: Filed Vitals:   02/09/16 1006  BP:  144/83  Pulse: 84  Temp: 98.1 F (36.7 C)  Resp: 16     Body mass index is 47.06 kg/(m^2).    ECOG FS:0 - Asymptomatic  General: Well-developed, well-nourished, no acute distress. Eyes: Pink conjunctiva, anicteric sclera. Breasts: Exam deferred today. Lungs: Clear to auscultation bilaterally. Heart: Regular rate and rhythm. No rubs, murmurs, or gallops. Abdomen: Soft, nontender, nondistended. No organomegaly noted, normoactive bowel sounds. Musculoskeletal: No edema, cyanosis, or clubbing. Neuro: Alert, answering all questions appropriately. Cranial nerves grossly intact. Skin: No rashes or petechiae noted. Psych: Normal affect.   LAB RESULTS:  Lab Results  Component Value Date   NA 133* 02/09/2016   K 3.6 02/09/2016   CL 101 02/09/2016   CO2 24 02/09/2016   GLUCOSE  122* 02/09/2016   BUN 13 02/09/2016   CREATININE 0.82 02/09/2016   CALCIUM 8.8* 02/09/2016   PROT 7.7 02/09/2016   ALBUMIN 3.7 02/09/2016   AST 15 02/09/2016   ALT 12* 02/09/2016   ALKPHOS 65 02/09/2016   BILITOT 0.3 02/09/2016   GFRNONAA >60 02/09/2016   GFRAA >60 02/09/2016    Lab Results  Component Value Date   WBC 8.4 02/09/2016   NEUTROABS 6.9* 02/09/2016   HGB 9.6* 02/09/2016   HCT 28.5* 02/09/2016   MCV 84.9 02/09/2016   PLT 177 02/09/2016   Lab Results  Component Value Date   IRON 48 02/09/2016   TIBC 326 02/09/2016   IRONPCTSAT 15 02/09/2016     STUDIES: Dg Chest 2 View  02/02/2016  CLINICAL DATA:  Persistent cough for 1 month. EXAM: CHEST  2 VIEW COMPARISON:  12/23/2015. FINDINGS: The cardiac silhouette, mediastinal and hilar contours are within normal limits and stable. The left subclavian power port is stable. The lungs are clear. No pleural effusion. The bony thorax is intact. IMPRESSION: No acute cardiopulmonary findings. Electronically Signed   By: Marijo Sanes M.D.   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  PLAN:    1. Breast cancer: Patient is now status post mastectomy. Surgical pathology results as well as PET scan results reviewed independently confirming stage IIB disease. Proceed with cycle 3 of 4 of Taxotere and Cytoxan today. She will require XRT at the conclusion of her treatments. Finally, patient will benefit from an aromatase inhibitor for 5 years at the conclusion of her treatments. Return to clinic in two days for nuelasta injection and then in 3 weeks for consideration of cycle 4. 2. Rash: OTC Benadryl or OTC Butt Paste. 3. Leukopenia: Resolved. Secondary to chemotherapy. Continue Neulasta as above. 4. Anemia: B12/Folate and Iron Studies to be drawn today. 5. Cough: CXR was negative. Monitor. 6. Hypokalemia:  Resolved. Continue Potassium 20 meq daily.   Patient expressed understanding and was in agreement with this plan. She also understands that She can call clinic at any time with any questions, concerns, or complaints.   Invasive lobular carcinoma of right breast, stage 2 ([place])   Staging form: Breast, AJCC 7th Edition     Pathologic stage: Stage IIB (T2, N1a, M0) - Signed by Lloyd Huger, MD on 10/22/2015   Mayra Reel, NP   02/09/2016 11:47 AM  Patient was seen and evaluated and I agree with the assessment and plan as dictated above.  Lloyd Huger, MD 02/13/2016 10:30 PM

## 2016-02-09 NOTE — Progress Notes (Signed)
Patient had more nausea after last treatment but med did help.  Still has a cough despite taking a round of antibiotics and is concerned that it could be cancer in her lungs.  She has been wearing a maxi pad due to the coughing causing leak in urination and she is allergic to the maxi pads so would like to know what kind of cream she can use.  Also having an increase in fatigue.

## 2016-02-10 ENCOUNTER — Inpatient Hospital Stay: Payer: BLUE CROSS/BLUE SHIELD

## 2016-02-11 ENCOUNTER — Inpatient Hospital Stay: Payer: BLUE CROSS/BLUE SHIELD

## 2016-02-11 DIAGNOSIS — C50912 Malignant neoplasm of unspecified site of left female breast: Secondary | ICD-10-CM

## 2016-02-11 DIAGNOSIS — C50911 Malignant neoplasm of unspecified site of right female breast: Secondary | ICD-10-CM | POA: Diagnosis not present

## 2016-02-11 MED ORDER — PEGFILGRASTIM INJECTION 6 MG/0.6ML ~~LOC~~
6.0000 mg | PREFILLED_SYRINGE | Freq: Once | SUBCUTANEOUS | Status: AC
  Administered 2016-02-11: 6 mg via SUBCUTANEOUS
  Filled 2016-02-11: qty 0.6

## 2016-02-18 ENCOUNTER — Other Ambulatory Visit: Payer: Self-pay | Admitting: Internal Medicine

## 2016-02-27 ENCOUNTER — Encounter: Payer: Self-pay | Admitting: *Deleted

## 2016-03-01 ENCOUNTER — Inpatient Hospital Stay: Payer: BLUE CROSS/BLUE SHIELD | Admitting: *Deleted

## 2016-03-01 ENCOUNTER — Inpatient Hospital Stay: Payer: BLUE CROSS/BLUE SHIELD

## 2016-03-01 ENCOUNTER — Inpatient Hospital Stay (HOSPITAL_BASED_OUTPATIENT_CLINIC_OR_DEPARTMENT_OTHER): Payer: BLUE CROSS/BLUE SHIELD | Admitting: Internal Medicine

## 2016-03-01 VITALS — BP 144/84 | HR 77 | Temp 97.7°F | Resp 16 | Wt 292.1 lb

## 2016-03-01 DIAGNOSIS — E876 Hypokalemia: Secondary | ICD-10-CM

## 2016-03-01 DIAGNOSIS — Z7689 Persons encountering health services in other specified circumstances: Secondary | ICD-10-CM

## 2016-03-01 DIAGNOSIS — Z170 Estrogen receptor positive status [ER+]: Secondary | ICD-10-CM | POA: Diagnosis not present

## 2016-03-01 DIAGNOSIS — C50912 Malignant neoplasm of unspecified site of left female breast: Secondary | ICD-10-CM

## 2016-03-01 DIAGNOSIS — C50911 Malignant neoplasm of unspecified site of right female breast: Secondary | ICD-10-CM

## 2016-03-01 DIAGNOSIS — Z79899 Other long term (current) drug therapy: Secondary | ICD-10-CM

## 2016-03-01 DIAGNOSIS — Z9013 Acquired absence of bilateral breasts and nipples: Secondary | ICD-10-CM

## 2016-03-01 LAB — COMPREHENSIVE METABOLIC PANEL
ALBUMIN: 3.8 g/dL (ref 3.5–5.0)
ALT: 13 U/L — ABNORMAL LOW (ref 14–54)
ANION GAP: 7 (ref 5–15)
AST: 24 U/L (ref 15–41)
Alkaline Phosphatase: 74 U/L (ref 38–126)
BILIRUBIN TOTAL: 0.6 mg/dL (ref 0.3–1.2)
BUN: 13 mg/dL (ref 6–20)
CO2: 26 mmol/L (ref 22–32)
CREATININE: 0.87 mg/dL (ref 0.44–1.00)
Calcium: 8.4 mg/dL — ABNORMAL LOW (ref 8.9–10.3)
Chloride: 101 mmol/L (ref 101–111)
GFR calc Af Amer: >60 mL/min (ref >60)
Glucose, Bld: 136 mg/dL — ABNORMAL HIGH (ref 65–99)
POTASSIUM: 3.2 mmol/L — AB (ref 3.5–5.1)
Sodium: 134 mmol/L — ABNORMAL LOW (ref 135–145)
Total Protein: 8 g/dL (ref 6.5–8.1)

## 2016-03-01 LAB — CBC WITH DIFFERENTIAL/PLATELET
BASOS PCT: 1 %
Basophils Absolute: 0.1 10*3/uL (ref 0–0.1)
EOS PCT: 0 %
Eosinophils Absolute: 0 10*3/uL (ref 0–0.7)
HEMATOCRIT: 27.2 % — AB (ref 35.0–47.0)
Hemoglobin: 9.2 g/dL — ABNORMAL LOW (ref 12.0–16.0)
Lymphocytes Relative: 8 %
Lymphs Abs: 0.8 10*3/uL — ABNORMAL LOW (ref 1.0–3.6)
MCH: 29.5 pg (ref 26.0–34.0)
MCHC: 33.9 g/dL (ref 32.0–36.0)
MCV: 87.1 fL (ref 80.0–100.0)
MONO ABS: 0.5 10*3/uL (ref 0.2–0.9)
MONOS PCT: 5 %
NEUTROS ABS: 9.1 10*3/uL — AB (ref 1.4–6.5)
Neutrophils Relative %: 86 %
Platelets: 189 10*3/uL (ref 150–440)
RBC: 3.13 MIL/uL — ABNORMAL LOW (ref 3.80–5.20)
RDW: 20.8 % — AB (ref 11.5–14.5)
WBC: 10.5 10*3/uL (ref 3.6–11.0)

## 2016-03-01 MED ORDER — PALONOSETRON HCL INJECTION 0.25 MG/5ML
0.2500 mg | Freq: Once | INTRAVENOUS | Status: AC
  Administered 2016-03-01: 0.25 mg via INTRAVENOUS
  Filled 2016-03-01: qty 5

## 2016-03-01 MED ORDER — DOCETAXEL CHEMO INJECTION 160 MG/16ML
180.0000 mg | Freq: Once | INTRAVENOUS | Status: AC
  Administered 2016-03-01: 180 mg via INTRAVENOUS
  Filled 2016-03-01: qty 18

## 2016-03-01 MED ORDER — OXYCODONE-ACETAMINOPHEN 5-325 MG PO TABS: 1.0000 | ORAL_TABLET | Freq: Three times a day (TID) | ORAL | Status: DC | PRN

## 2016-03-01 MED ORDER — SODIUM CHLORIDE 0.9 % IV SOLN
10.0000 mg | Freq: Once | INTRAVENOUS | Status: AC
  Administered 2016-03-01: 10 mg via INTRAVENOUS
  Filled 2016-03-01: qty 1

## 2016-03-01 MED ORDER — SODIUM CHLORIDE 0.9 % IV SOLN
Freq: Once | INTRAVENOUS | Status: AC
  Administered 2016-03-01: 11:00:00 via INTRAVENOUS
  Filled 2016-03-01: qty 1000

## 2016-03-01 MED ORDER — SODIUM CHLORIDE 0.9 % IV SOLN
1500.0000 mg | Freq: Once | INTRAVENOUS | Status: AC
  Administered 2016-03-01: 1500 mg via INTRAVENOUS
  Filled 2016-03-01: qty 25

## 2016-03-01 MED ORDER — HEPARIN SOD (PORK) LOCK FLUSH 100 UNIT/ML IV SOLN
500.0000 [IU] | Freq: Once | INTRAVENOUS | Status: AC | PRN
  Administered 2016-03-01: 500 [IU]
  Filled 2016-03-01: qty 5

## 2016-03-01 MED ORDER — DOCETAXEL CHEMO INJECTION 160 MG/16ML: 75.0000 mg/m2 | Freq: Once | INTRAVENOUS | Status: DC

## 2016-03-01 NOTE — Progress Notes (Signed)
On Friday patient was reaching up trying to get something out of cabinet then later that night she was having left side shoulder pain.  She has tried a muscle relaxer medication with no relief when ice applied to shoulder it helped at first.  The pain was relieved with Oxycodone 5/325mg  that she had from Dr. Azalee Course and would like a refill.  Today is patient's last chemotherapy and has not been scheduled to start XRT yet.

## 2016-03-01 NOTE — Progress Notes (Signed)
[place] OFFICE PROGRESS NOTE  Patient Care Team: Birdie Sons, MD as PCP - General (Family Medicine) [name], MD as Consulting Physician (Gastroenterology)   SUMMARY OF ONCOLOGIC HISTORY:  # INVASIVE LOBULAR BREAST CANCER STAGE II [T2N1; no ALND;  s/p Mastec & SLNx-  Dr.Loflin] ER/PR POS; Her 2 NEG;   INTERVAL HISTORY:  I am seeing this patient in absence of Dr.Finnegan;  I reviewed the patient's prior charts/pertinent labs/imaging in detail; findings are summarized above.   A very pleasant 66 year old female patient with above history of stage II invasive lobular cancer currently on adjuvant chemotherapy is here for follow-up. She is currently status post 3 cycles.  Patient was trying to reach something high when she strained her left arm/shoulder- currently improving; she is taking 1 Percocet every 8 hours as needed.  Patient has intermittent blood in stools/chronic hemorrhoidal- with the bowel movements. This is not any worse. She denies any obvious constipation or diarrhea tingling or numbness. No nausea no vomiting. No fevers. No skin rash.    REVIEW OF SYSTEMS:  A complete 10 point review of system is done which is negative except mentioned above/history of present illness.   PAST MEDICAL HISTORY :  Past Medical History  Diagnosis Date  . Hypertension   . GERD (gastroesophageal reflux disease)   . Thyroid disease   . Hyperlipidemia   . History of chicken pox   . Hypothyroidism   . Cancer [facility])     breast cancer    PAST SURGICAL HISTORY :   Past Surgical History  Procedure Laterality Date  . Cholecystectomy  1990  . Wrist surgery    . Cesarean section  1988  . Exploratory laparotomy      Left breast  . Abdominal hysterectomy  07/13/2013    Total. with BSO for postmenopausal bleeding and fibroids with large cervical polyp. Dr. Levy Sjogren and Dr. Glennon Mac at [facility]  . Breast surgery  2011    BREAST BIOPSY  . Abdominal ultrasound  04/17/2011   Hepatomegaly with borderline slenomegaly. Suggestive of fatty liver. s/p cholecystectomy. Portions of aorta obscured  . Mastectomy w/ sentinel node biopsy Bilateral 10/28/2015    Procedure: MASTECTOMY WITH SENTINEL LYMPH NODE BIOPSY;  Surgeon: Hubbard Robinson, MD;  Location: [facility] ORS;  Service: General;  Laterality: Bilateral;  . Portacath placement Left 12/23/2015    Procedure: INSERTION PORT-A-CATH;  Surgeon: Hubbard Robinson, MD;  Location: [facility] ORS;  Service: General;  Laterality: Left;    FAMILY HISTORY :   Family History  Problem Relation Age of Onset  . Hypertension Mother   . Thyroid disease Mother   . Hypertension Brother   . Diabetes Brother     SOCIAL HISTORY:   Social History  Substance Use Topics  . Smoking status: Never Smoker   . Smokeless tobacco: Never Used  . Alcohol Use: No    ALLERGIES:  is allergic to penicillins; nickel; and pravastatin sodium.  MEDICATIONS:  Current Outpatient Prescriptions  Medication Sig Dispense Refill  . atenolol (TENORMIN) 50 MG tablet Take 1 tablet (50 mg total) by mouth daily. 90 tablet 4  . cyclobenzaprine (FLEXERIL) 5 MG tablet Take 1 tablet (5 mg total) by mouth 3 (three) times daily as needed for muscle spasms. 60 tablet 1  . docusate sodium (COLACE) 100 MG capsule Take 100 mg by mouth daily.    . fexofenadine (ALLEGRA) 180 MG tablet Take 180 mg by mouth daily.    . hydrochlorothiazide (HYDRODIURIL) 25  MG tablet Take 1 tablet by mouth daily.    Marland Kitchen ketoconazole (NIZORAL) 2 % cream Apply 1 application topically daily. 50 g 0  . lansoprazole (PREVACID) 15 MG capsule Take 15 mg by mouth daily at 12 noon.    Marland Kitchen levothyroxine (SYNTHROID, LEVOTHROID) 75 MCG tablet Take 1 tablet (75 mcg total) by mouth daily. 30 tablet 5  . lidocaine-prilocaine (EMLA) cream Apply to affected area once 30 g 3  . magic mouthwash SOLN RINSE AND SPIT ONE TEASPOONFUL BY MOUTH EVERY 3 TO 4 HOURS AS NEEDED 100 mL 1  . meclizine (ANTIVERT) 25 MG tablet TAKE  ONE TABLET BY MOUTH EVERY 4 TO 6 HOURS AS NEEDED 30 tablet 3  . ondansetron (ZOFRAN) 8 MG tablet Take 1 tablet (8 mg total) by mouth 2 (two) times daily as needed for refractory nausea / vomiting. Start on day 3 after chemo. 30 tablet 1  . potassium chloride SA (K-DUR,KLOR-CON) 20 MEQ tablet Take 1 tablet (20 mEq total) by mouth daily. 30 tablet 2  . pramoxine-hydrocortisone 1-1 % foam Apply topically 3 (three) times daily. For hemorrhoids    . prochlorperazine (COMPAZINE) 10 MG tablet Take 1 tablet (10 mg total) by mouth every 6 (six) hours as needed (Nausea or vomiting). 30 tablet 1   No current facility-administered medications for this visit.   Facility-Administered Medications Ordered in Other Visits  Medication Dose Route Frequency Provider Last Rate Last Dose  . sodium chloride flush (NS) 0.9 % injection 10 mL  10 mL Intravenous PRN Lloyd Huger, MD   10 mL at 02/09/16 0930    PHYSICAL EXAMINATION: ECOG PERFORMANCE STATUS: 0 - Asymptomatic  BP 144/84 mmHg  Pulse 77  Temp(Src) 97.7 F (36.5 C) (Tympanic)  Resp 16  Wt 292 lb 1.8 oz (132.5 kg)  Filed Weights   03/01/16 0947  Weight: 292 lb 1.8 oz (132.5 kg)    GENERAL: Well-nourished well-developed; Alert, no distress and comfortable.  Accompanied by family.  EYES: no pallor or icterus OROPHARYNX: no thrush or ulceration; good dentition  NECK: supple, no masses felt LYMPH:  no palpable lymphadenopathy in the cervical, axillary or inguinal regions LUNGS: clear to auscultation and  No wheeze or crackles HEART/CVS: regular rate & rhythm and no murmurs; No lower extremity edema ABDOMEN:abdomen soft, non-tender and normal bowel sounds Musculoskeletal:no cyanosis of digits and no clubbing  PSYCH: alert & oriented x 3 with fluent speech NEURO: no focal motor/sensory deficits SKIN:  no rashes or significant lesions; bilateral mastectomies. Port - no erythema and no discharge accessed.  LABORATORY DATA:  I have reviewed the  data as listed    Component Value Date/Time   NA 134* 03/01/2016 0920   NA 146 01/22/2015   NA 136 07/15/2013 1057   K 3.2* 03/01/2016 0920   K 4.1 07/15/2013 1057   CL 101 03/01/2016 0920   CL 102 07/15/2013 1057   CO2 26 03/01/2016 0920   CO2 27 07/15/2013 1057   GLUCOSE 136* 03/01/2016 0920   GLUCOSE 112* 07/15/2013 1057   BUN 13 03/01/2016 0920   BUN 14 01/22/2015   BUN 10 07/15/2013 1057   CREATININE 0.87 03/01/2016 0920   CREATININE 0.8 01/22/2015   CREATININE 0.98 07/15/2013 1057   CALCIUM 8.4* 03/01/2016 0920   CALCIUM 8.2* 07/15/2013 1057   PROT 8.0 03/01/2016 0920   ALBUMIN 3.8 03/01/2016 0920   AST 24 03/01/2016 0920   ALT 13* 03/01/2016 0920   ALKPHOS 74 03/01/2016 0920  BILITOT 0.6 03/01/2016 0920   GFRNONAA >60 03/01/2016 0920   GFRNONAA >60 07/15/2013 1057   GFRAA >60 03/01/2016 0920   GFRAA >60 07/15/2013 1057    No results found for: SPEP, UPEP  Lab Results  Component Value Date   WBC 10.5 03/01/2016   NEUTROABS 9.1* 03/01/2016   HGB 9.2* 03/01/2016   HCT 27.2* 03/01/2016   MCV 87.1 03/01/2016   PLT 189 03/01/2016      Chemistry      Component Value Date/Time   NA 134* 03/01/2016 0920   NA 146 01/22/2015   NA 136 07/15/2013 1057   K 3.2* 03/01/2016 0920   K 4.1 07/15/2013 1057   CL 101 03/01/2016 0920   CL 102 07/15/2013 1057   CO2 26 03/01/2016 0920   CO2 27 07/15/2013 1057   BUN 13 03/01/2016 0920   BUN 14 01/22/2015   BUN 10 07/15/2013 1057   CREATININE 0.87 03/01/2016 0920   CREATININE 0.8 01/22/2015   CREATININE 0.98 07/15/2013 1057   GLU 110 01/22/2015      Component Value Date/Time   CALCIUM 8.4* 03/01/2016 0920   CALCIUM 8.2* 07/15/2013 1057   ALKPHOS 74 03/01/2016 0920   AST 24 03/01/2016 0920   ALT 13* 03/01/2016 0920   BILITOT 0.6 03/01/2016 0920        ASSESSMENT & PLAN:   # BREAST CANCER STAGE II ER/PR POS; her 2 NEG. Currently on adjuvant cytoxan Taxotere proceed with cycle #4 today. Patient tolerating  chemotherapy well.  # Discussed the need for radiation given positive lymph nodes. We will recommend follow-up with Dr. Donella Stade in the next one to 2 weeks. Patient will need antihormone therapy post radiation.  # CBC within normal limits except for anemia hemoglobin 9 likely from chemotherapy. CMP within normal limits except for mild hypokalemia.  # Hypokalemia 3.2 recommend taking potassium pills at home.  # Left shoulder/arm pain- likely muscle strain/ clinically not suggestive of any blood clots. However if it continues to get worse we'll recommend ultrasound of the arm.  # Patient follow-up with Dr. Grayland Ormond in 3 weeks with labs.     Cammie Sickle, MD 03/01/2016 9:58 AM

## 2016-03-03 ENCOUNTER — Inpatient Hospital Stay: Payer: BLUE CROSS/BLUE SHIELD

## 2016-03-03 VITALS — BP 143/83 | HR 80 | Temp 97.6°F | Resp 20

## 2016-03-03 DIAGNOSIS — C50911 Malignant neoplasm of unspecified site of right female breast: Secondary | ICD-10-CM | POA: Diagnosis not present

## 2016-03-03 DIAGNOSIS — C50912 Malignant neoplasm of unspecified site of left female breast: Secondary | ICD-10-CM

## 2016-03-03 MED ORDER — PEGFILGRASTIM INJECTION 6 MG/0.6ML ~~LOC~~
6.0000 mg | PREFILLED_SYRINGE | Freq: Once | SUBCUTANEOUS | Status: AC
  Administered 2016-03-03: 6 mg via SUBCUTANEOUS
  Filled 2016-03-03: qty 0.6

## 2016-03-04 ENCOUNTER — Telehealth: Payer: Self-pay | Admitting: Surgery

## 2016-03-04 ENCOUNTER — Encounter: Payer: Self-pay | Admitting: *Deleted

## 2016-03-04 ENCOUNTER — Other Ambulatory Visit: Payer: Self-pay | Admitting: *Deleted

## 2016-03-04 NOTE — Telephone Encounter (Signed)
Will speak with Dr. Azalee Course regarding this patient and return phone call.

## 2016-03-04 NOTE — Telephone Encounter (Signed)
Spoke with patient at this time. Explained that with the difficulty and manipulation that occurred at her treatment on Monday, she is most likely having pain and bruising from the inflammation around the port but not due to the port being dislodged or out of place. Explained to patient that I would like for her to use Ice on this area 4-5 times daily for 15-20 minutes each time for the next 5-7 days and see if this does not help the pain. Encouraged patient to call back in after this period of time, she is still having this pain. She verbalizes understanding and will call if this is no better after ice.

## 2016-03-04 NOTE — Telephone Encounter (Signed)
Patient has called and stated that when she had her chemo treatment, the nurse stated that the port was twisted, it is now bruised a lot from where they did the treatment. She states that she feels there is a stinging and shoots of pain in the area. She would like a nurse to call her back to discuss this.

## 2016-03-04 NOTE — Telephone Encounter (Signed)
Spoke with Sheena regarding Chemo treatment on 03/01/16 to understand exactly what happened with port.  She states that [facility] accessed the port and initially did not get a blood return. She placed the patient back in the chair and was able to re-access port with a fantastic blood return. No difficulty with flushing was noted and there was not any reported symptoms from patient throughout treatment.

## 2016-03-10 ENCOUNTER — Encounter: Payer: Self-pay | Admitting: Radiation Oncology

## 2016-03-10 ENCOUNTER — Telehealth: Payer: Self-pay | Admitting: Surgery

## 2016-03-10 ENCOUNTER — Ambulatory Visit
Admission: RE | Admit: 2016-03-10 | Discharge: 2016-03-10 | Disposition: A | Discharge status: Home / Self Care | Payer: BLUE CROSS/BLUE SHIELD | Source: Ambulatory Visit | Attending: Radiation Oncology | Admitting: Radiation Oncology

## 2016-03-10 ENCOUNTER — Other Ambulatory Visit: Payer: Self-pay | Admitting: *Deleted

## 2016-03-10 VITALS — BP 154/80 | HR 83 | Temp 97.2°F | Wt 290.8 lb

## 2016-03-10 DIAGNOSIS — C50911 Malignant neoplasm of unspecified site of right female breast: Secondary | ICD-10-CM

## 2016-03-10 NOTE — Progress Notes (Signed)
Radiation Oncology Follow up Note  Name: Kaylee Reyes   Date:   03/10/2016 MRN:  [id number] DOB: 09-26-1951    This 65 y.o. female presents to the clinic today for delayed start for radiation therapy to right chest wall and peripheral lymphatics for stage IIB (T2 N1 M0) ER/PR positive HER-2/neu negative invasive lobular carcinoma with mixed invasive and lobular features status post bilateral mastectomies and adjuvant systemic chemotherapy.  REFERRING PROVIDER: Birdie Sons, MD  HPI: Patient is a 65 year old female originally consult of actinic in December 2016 for stage IIB ER/PR positive HER-2/neu negative lobular carcinoma with mixed features status post bilateral mastectomies. 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 Surgical pathology showed 2 of 6 sentinel lymph nodes positive for metastatic disease with 1 node metastatic focus measuring 2.4 cm the other measuring 1.4 cm. Margins were clear. She is on undergone 4 cycles of Cytoxan and Taxotere which she is tolerated fairly well her only problem is been slow healing of her mastectomy scars which still granulation present in the central part of the scar. She is seen today for radiation therapy portion of her treatment plan.  COMPLICATIONS OF TREATMENT: none  FOLLOW UP COMPLIANCE: keeps appointments   PHYSICAL EXAM:  BP 154/80 mmHg  Pulse 83  Temp(Src) 97.2 F (36.2 C)  Wt 290 lb 12.6 oz (131.9 kg) Patient is status post bilateral mastectomies. Both sites have granulation tissue in the central part of the scar. There is also still some oozing from the scar. No evidence of chest wall mass or nodularity is identified no axillary or supraclavicular adenopathy is appreciated. Patient is obese. Well-developed well-nourished patient in NAD. HEENT reveals PERLA, EOMI, discs not  visualized.  Oral cavity is clear. No oral mucosal lesions are identified. Neck is clear without evidence of cervical or supraclavicular adenopathy. Lungs are clear to A&P. Cardiac examination is essentially unremarkable with regular rate and rhythm without murmur rub or thrill. Abdomen is benign with no organomegaly or masses noted. Motor sensory and DTR levels are equal and symmetric in the upper and lower extremities. Cranial nerves II through XII are grossly intact. Proprioception is intact. No peripheral adenopathy or edema is identified. No motor or sensory levels are noted. Crude visual fields are within normal range.  RADIOLOGY RESULTS: No current films for review  PLAN: At the present time I to go ahead with radiation therapy to her right chest wall and peripheral lymphatics. Would plan on delivering 5000 cGy to both chest wall M peripheral lymphatics over 5 weeks. Would also boost her scar another 1400 cGy using electrons. I would like to await another 2 weeks for further healing of her right mastectomy scar and have set up an appointment a follow-up with surgeon for possible opinion regarding ways to further heal this area. The delayed healing may be due to patient's chemotherapy and/or obesity. I personally set up and ordered CT simulation in about 2 weeks time we will evaluate her scar at that time. Risks and benefits of treatment including skin reaction fatigue alteration of blood counts possibility of lymphedema of her right upper extremity all were explained in detail to the patient. She seems to comprehend my treatment plan well.  I would like to take this opportunity for allowing me to participate in the care of your patient.Armstead Peaks., MD

## 2016-03-10 NOTE — Telephone Encounter (Signed)
Kim from the [facility] in [place] called to schedule Kaylee Reyes an appointment in the next few days. They want to start radiation in the next couple of weeks but they feel the right breast is still a little wet and would like for it to be healed a little more. They would like for Dr. Azalee Course to see her. Can you give me a date/time?

## 2016-03-10 NOTE — Progress Notes (Signed)
Education discussed with patient and her sister.  Written information for specific site given.  25 minutes spent with patient and sister answering questions.

## 2016-03-11 NOTE — Telephone Encounter (Signed)
Scheduled patient on 4/11 with Dr. Burt Knack per [employer]

## 2016-03-15 ENCOUNTER — Telehealth: Payer: Self-pay | Admitting: *Deleted

## 2016-03-15 NOTE — Telephone Encounter (Signed)
Complains of extreme fatigue and sob with any exertion. Last Hgb 9.2 on 3/27. Asking if this is nml. Her last chemo was on 3/27 as well. She has next appt on 4/17. Please advise

## 2016-03-15 NOTE — Telephone Encounter (Signed)
Per Dr Grayland Ormond, will monitor for now, hgb not at level for transfusion. Pt informed

## 2016-03-16 ENCOUNTER — Ambulatory Visit (INDEPENDENT_AMBULATORY_CARE_PROVIDER_SITE_OTHER): Payer: BLUE CROSS/BLUE SHIELD | Admitting: Surgery

## 2016-03-16 ENCOUNTER — Ambulatory Visit: Payer: BLUE CROSS/BLUE SHIELD | Admitting: Surgery

## 2016-03-16 ENCOUNTER — Encounter: Payer: Self-pay | Admitting: Surgery

## 2016-03-16 VITALS — HR 100 | Temp 98.8°F | Ht 66.0 in | Wt 298.0 lb

## 2016-03-16 DIAGNOSIS — C50911 Malignant neoplasm of unspecified site of right female breast: Secondary | ICD-10-CM | POA: Diagnosis not present

## 2016-03-16 NOTE — Patient Instructions (Signed)
We have placed silver nitrate to the wound bed again today. Please keep a dressing that you have been provided today over this area until completely dry.  We will follow-up with you in office next week as scheduled below.

## 2016-03-16 NOTE — Progress Notes (Signed)
Outpatient postop visit  03/16/2016  Kaylee Reyes is an 65 y.o. female.    Procedure: Bilateral Mastectomy  CC: Drainage  HPI: Patient here for evaluation of her wounds. She has completed chemotherapy and is starting radiotherapy. She had bilateral mastectomies and both wounds broke down and have been healing by secondary intention. Her radiotherapy is been delayed due to these open wounds.  Medications reviewed.    Physical Exam:  There were no vitals taken for this visit.    PE: Morbidly obese female patient bilateral mastectomy wounds healing by secondary intention both wounds are very shallow and superficial and flat there is no erythema only clean granulation tissue which is treated with silver nitrate at the edges. Calves are nontender    Assessment/Plan:  Healing wounds so nitrate applied she is to have radiation therapy on the right side only once these wounds are healed. I would suggest use a silver nitrate weekly until these are healed so that she can start her radiotherapy and complete her course. I discussed with her the fact that radiotherapy impairs wound healing and we would not want to start radiotherapy until this is completely healed and even with that there is a risk of the wounds opening again.  Florene Glen, MD, FACS

## 2016-03-22 ENCOUNTER — Ambulatory Visit
Admission: RE | Admit: 2016-03-22 | Discharge: 2016-03-22 | Disposition: A | Discharge status: Home / Self Care | Payer: BLUE CROSS/BLUE SHIELD | Source: Ambulatory Visit | Attending: Radiation Oncology | Admitting: Radiation Oncology

## 2016-03-22 ENCOUNTER — Inpatient Hospital Stay: Payer: BLUE CROSS/BLUE SHIELD

## 2016-03-22 ENCOUNTER — Inpatient Hospital Stay: Payer: BLUE CROSS/BLUE SHIELD | Attending: Oncology | Admitting: Oncology

## 2016-03-22 ENCOUNTER — Ambulatory Visit: Admission: RE | Admit: 2016-03-22 | Payer: BLUE CROSS/BLUE SHIELD | Source: Ambulatory Visit

## 2016-03-22 VITALS — BP 150/86 | HR 87 | Temp 99.0°F | Resp 18 | Wt 297.4 lb

## 2016-03-22 DIAGNOSIS — K219 Gastro-esophageal reflux disease without esophagitis: Secondary | ICD-10-CM | POA: Diagnosis not present

## 2016-03-22 DIAGNOSIS — E039 Hypothyroidism, unspecified: Secondary | ICD-10-CM | POA: Insufficient documentation

## 2016-03-22 DIAGNOSIS — Z9013 Acquired absence of bilateral breasts and nipples: Secondary | ICD-10-CM | POA: Diagnosis not present

## 2016-03-22 DIAGNOSIS — E876 Hypokalemia: Secondary | ICD-10-CM | POA: Insufficient documentation

## 2016-03-22 DIAGNOSIS — E785 Hyperlipidemia, unspecified: Secondary | ICD-10-CM | POA: Diagnosis not present

## 2016-03-22 DIAGNOSIS — Z9221 Personal history of antineoplastic chemotherapy: Secondary | ICD-10-CM | POA: Insufficient documentation

## 2016-03-22 DIAGNOSIS — I10 Essential (primary) hypertension: Secondary | ICD-10-CM | POA: Diagnosis not present

## 2016-03-22 DIAGNOSIS — Z79899 Other long term (current) drug therapy: Secondary | ICD-10-CM | POA: Insufficient documentation

## 2016-03-22 DIAGNOSIS — C50912 Malignant neoplasm of unspecified site of left female breast: Secondary | ICD-10-CM

## 2016-03-22 DIAGNOSIS — C50911 Malignant neoplasm of unspecified site of right female breast: Secondary | ICD-10-CM | POA: Insufficient documentation

## 2016-03-22 DIAGNOSIS — D649 Anemia, unspecified: Secondary | ICD-10-CM | POA: Diagnosis not present

## 2016-03-22 DIAGNOSIS — Z170 Estrogen receptor positive status [ER+]: Secondary | ICD-10-CM | POA: Insufficient documentation

## 2016-03-22 LAB — CBC WITH DIFFERENTIAL/PLATELET
BASOS PCT: 1 %
Basophils Absolute: 0.1 10*3/uL (ref 0–0.1)
EOS ABS: 0 10*3/uL (ref 0–0.7)
Eosinophils Relative: 0 %
HCT: 25.7 % — ABNORMAL LOW (ref 35.0–47.0)
HEMOGLOBIN: 8.8 g/dL — AB (ref 12.0–16.0)
LYMPHS ABS: 0.9 10*3/uL — AB (ref 1.0–3.6)
Lymphocytes Relative: 12 %
MCH: 30.8 pg (ref 26.0–34.0)
MCHC: 34.3 g/dL (ref 32.0–36.0)
MCV: 89.7 fL (ref 80.0–100.0)
Monocytes Absolute: 0.4 10*3/uL (ref 0.2–0.9)
Monocytes Relative: 5 %
NEUTROS PCT: 82 %
Neutro Abs: 5.8 10*3/uL (ref 1.4–6.5)
PLATELETS: 154 10*3/uL (ref 150–440)
RBC: 2.86 MIL/uL — AB (ref 3.80–5.20)
RDW: 20.7 % — ABNORMAL HIGH (ref 11.5–14.5)
WBC: 7.1 10*3/uL (ref 3.6–11.0)

## 2016-03-22 LAB — COMPREHENSIVE METABOLIC PANEL
ALT: 15 U/L (ref 14–54)
ANION GAP: 5 (ref 5–15)
AST: 20 U/L (ref 15–41)
Albumin: 3.9 g/dL (ref 3.5–5.0)
Alkaline Phosphatase: 69 U/L (ref 38–126)
BUN: 11 mg/dL (ref 6–20)
CHLORIDE: 102 mmol/L (ref 101–111)
CO2: 26 mmol/L (ref 22–32)
Calcium: 8.6 mg/dL — ABNORMAL LOW (ref 8.9–10.3)
Creatinine, Ser: 0.79 mg/dL (ref 0.44–1.00)
GFR calc non Af Amer: >60 mL/min (ref >60)
GLUCOSE: 118 mg/dL — AB (ref 65–99)
Potassium: 3.4 mmol/L — ABNORMAL LOW (ref 3.5–5.1)
SODIUM: 133 mmol/L — AB (ref 135–145)
Total Bilirubin: 0.5 mg/dL (ref 0.3–1.2)
Total Protein: 7.5 g/dL (ref 6.5–8.1)

## 2016-03-22 NOTE — Progress Notes (Signed)
[place]  Telephone:([phone]) [phone] Fax:(336) [phone]  ID: Kaylee Reyes OB: 1951-09-19  MR#: [id number]  [id number]  Patient Care Team: Birdie Sons, MD as PCP - General (Family Medicine) [name], MD as Consulting Physician (Gastroenterology)  CHIEF COMPLAINT:  Chief Complaint  Patient presents with  . Breast Cancer  . Anemia    INTERVAL HISTORY: Patient returns to clinic today for further evaluation and laboratory work. She has now completed her adjuvant chemotherapy and has consultation with radiation oncology to discuss XRT. She continues to have increased fatigue and dyspnea exertion, but otherwise feels well.  She has no neurologic complaints. She denies any recent fevers or illnesses. She denies any pain. She has a good appetite and denies weight loss. She denies any chest pain. She has no nausea, vomiting, constipation, or diarrhea. She has no urinary complaints. Patient offers no other specific complaints today.  REVIEW OF SYSTEMS:   Review of Systems  Constitutional: Positive for malaise/fatigue. Negative for fever.  Respiratory: Positive for shortness of breath. Negative for cough.   Cardiovascular: Negative.   Gastrointestinal: Negative.   Genitourinary: Negative.   Musculoskeletal: Negative.   Skin: Negative for rash.  Neurological: Positive for weakness.  Psychiatric/Behavioral: Negative.     As per HPI. Otherwise, a complete review of systems is negatve.  PAST MEDICAL HISTORY: Past Medical History  Diagnosis Date  . Hypertension   . GERD (gastroesophageal reflux disease)   . Thyroid disease   . Hyperlipidemia   . History of chicken pox   . Hypothyroidism   . Cancer [facility])     breast cancer    PAST SURGICAL HISTORY: Past Surgical History  Procedure Laterality Date  . Cholecystectomy  1990  . Wrist surgery    . Cesarean section  1988  . Exploratory laparotomy      Left breast  . Abdominal hysterectomy  07/13/2013      Total. with BSO for postmenopausal bleeding and fibroids with large cervical polyp. Dr. Levy Sjogren and Dr. Glennon Mac at [facility]  . Breast surgery  2011    BREAST BIOPSY  . Abdominal ultrasound  04/17/2011    Hepatomegaly with borderline slenomegaly. Suggestive of fatty liver. s/p cholecystectomy. Portions of aorta obscured  . Mastectomy w/ sentinel node biopsy Bilateral 10/28/2015    Procedure: MASTECTOMY WITH SENTINEL LYMPH NODE BIOPSY;  Surgeon: Hubbard Robinson, MD;  Location: [facility] ORS;  Service: General;  Laterality: Bilateral;  . Portacath placement Left 12/23/2015    Procedure: INSERTION PORT-A-CATH;  Surgeon: Hubbard Robinson, MD;  Location: [facility] ORS;  Service: General;  Laterality: Left;    FAMILY HISTORY Family History  Problem Relation Age of Onset  . Hypertension Mother   . Thyroid disease Mother   . Hypertension Brother   . Diabetes Brother        ADVANCED DIRECTIVES:    HEALTH MAINTENANCE: Social History  Substance Use Topics  . Smoking status: Never Smoker   . Smokeless tobacco: Never Used  . Alcohol Use: No     Colonoscopy:  PAP:  Bone density:  Lipid panel:  Allergies  Allergen Reactions  . Penicillins Shortness Of Breath  . Nickel Hives  . Pravastatin Sodium     Muscle pain, muscle cramps, muscle weakness    Current Outpatient Prescriptions  Medication Sig Dispense Refill  . atenolol (TENORMIN) 50 MG tablet Take 1 tablet (50 mg total) by mouth daily. 90 tablet 4  . cyclobenzaprine (FLEXERIL) 5 MG tablet Take 1 tablet (5  mg total) by mouth 3 (three) times daily as needed for muscle spasms. 60 tablet 1  . docusate sodium (COLACE) 100 MG capsule Take 100 mg by mouth daily.    . fexofenadine (ALLEGRA) 180 MG tablet Take 180 mg by mouth daily.    . hydrochlorothiazide (HYDRODIURIL) 25 MG tablet Take 1 tablet by mouth daily.    Marland Kitchen ketoconazole (NIZORAL) 2 % cream Apply 1 application topically daily. 50 g 0  . lansoprazole (PREVACID) 15 MG capsule Take 15  mg by mouth daily at 12 noon.    Marland Kitchen levothyroxine (SYNTHROID, LEVOTHROID) 75 MCG tablet Take 1 tablet (75 mcg total) by mouth daily. 30 tablet 5  . lidocaine-prilocaine (EMLA) cream Apply to affected area once 30 g 3  . magic mouthwash SOLN RINSE AND SPIT ONE TEASPOONFUL BY MOUTH EVERY 3 TO 4 HOURS AS NEEDED 100 mL 1  . meclizine (ANTIVERT) 25 MG tablet TAKE ONE TABLET BY MOUTH EVERY 4 TO 6 HOURS AS NEEDED 30 tablet 3  . ondansetron (ZOFRAN) 8 MG tablet Take 1 tablet (8 mg total) by mouth 2 (two) times daily as needed for refractory nausea / vomiting. Start on day 3 after chemo. 30 tablet 1  . oxyCODONE-acetaminophen (PERCOCET/ROXICET) 5-325 MG tablet Take 1 tablet by mouth every 8 (eight) hours as needed for severe pain. 30 tablet 0  . potassium chloride SA (K-DUR,KLOR-CON) 20 MEQ tablet Take 1 tablet (20 mEq total) by mouth daily. 30 tablet 2  . pramoxine-hydrocortisone 1-1 % foam Apply topically 3 (three) times daily. For hemorrhoids    . prochlorperazine (COMPAZINE) 10 MG tablet Take 1 tablet (10 mg total) by mouth every 6 (six) hours as needed (Nausea or vomiting). 30 tablet 1   No current facility-administered medications for this visit.   Facility-Administered Medications Ordered in Other Visits  Medication Dose Route Frequency Provider Last Rate Last Dose  . sodium chloride flush (NS) 0.9 % injection 10 mL  10 mL Intravenous PRN Lloyd Huger, MD   10 mL at 02/09/16 0930    OBJECTIVE: Filed Vitals:   03/22/16 0925  BP: 150/86  Pulse: 87  Temp: 99 F (37.2 C)  Resp: 18     Body mass index is 48.02 kg/(m^2).    ECOG FS:0 - Asymptomatic  General: Well-developed, well-nourished, no acute distress. Eyes: Pink conjunctiva, anicteric sclera. Breasts: Exam deferred today. Lungs: Clear to auscultation bilaterally. Heart: Regular rate and rhythm. No rubs, murmurs, or gallops. Abdomen: Soft, nontender, nondistended. No organomegaly noted, normoactive bowel sounds. Musculoskeletal:  No edema, cyanosis, or clubbing. Neuro: Alert, answering all questions appropriately. Cranial nerves grossly intact. Skin: No rashes or petechiae noted. Psych: Normal affect.   LAB RESULTS:  Lab Results  Component Value Date   NA 133* 03/22/2016   K 3.4* 03/22/2016   CL 102 03/22/2016   CO2 26 03/22/2016   GLUCOSE 118* 03/22/2016   BUN 11 03/22/2016   CREATININE 0.79 03/22/2016   CALCIUM 8.6* 03/22/2016   PROT 7.5 03/22/2016   ALBUMIN 3.9 03/22/2016   AST 20 03/22/2016   ALT 15 03/22/2016   ALKPHOS 69 03/22/2016   BILITOT 0.5 03/22/2016   GFRNONAA >60 03/22/2016   GFRAA >60 03/22/2016    Lab Results  Component Value Date   WBC 7.1 03/22/2016   NEUTROABS 5.8 03/22/2016   HGB 8.8* 03/22/2016   HCT 25.7* 03/22/2016   MCV 89.7 03/22/2016   PLT 154 03/22/2016   Lab Results  Component Value Date   IRON  48 02/09/2016   TIBC 326 02/09/2016   IRONPCTSAT 15 02/09/2016     STUDIES: No results found.  ASSESSMENT: Pathologic stage IIB ER/PR positive invasive lobular carcinoma of the right breast.  PLAN:    1. Breast cancer: Patient is now status post mastectomy. Surgical pathology results as well as PET scan results reviewed independently confirming stage IIB disease. She completed 4 cycles of Taxotere and Cytoxan on March 03, 2016. She still having issues with wound healing, and XRT may be delayed. She has an appointment with radiation oncology to further discuss treatment. Patient will benefit from an aromatase inhibitor for 5 years at the conclusion of her treatments. Return to clinic in 3 months with repeat laboratory work and further evaluation 2. Leukopenia: Resolved. Secondary to chemotherapy.  3. Anemia: Patient's hemoglobin has trended down, but she does not require blood transfusion at this time. We also discussed the option of using Procrit which she has declined. Monitor.  4. Hypokalemia:  Mild. Continue Potassium 20 meq daily.   Patient expressed understanding  and was in agreement with this plan. She also understands that She can call clinic at any time with any questions, concerns, or complaints.   Invasive lobular carcinoma of right breast, stage 2 ([place])   Staging form: Breast, AJCC 7th Edition     Pathologic stage: Stage IIB (T2, N1a, M0) - Signed by Lloyd Huger, MD on 10/22/2015   Lloyd Huger, MD   03/22/2016 10:39 AM  Patient was seen and evaluated and I agree with the assessment and plan as dictated above.  Lloyd Huger, MD 03/22/2016 10:39 AM

## 2016-03-22 NOTE — Progress Notes (Signed)
Patient has not started radiation due to slow healing wounds from her mastectomy surgery and she is scheduled to see Dr. Baruch Gouty today.  Has concerns of dyspnea on exertion with fatigue and not able to walk very long before needing to rest.

## 2016-03-25 ENCOUNTER — Encounter: Payer: Self-pay | Admitting: General Surgery

## 2016-03-25 ENCOUNTER — Ambulatory Visit (INDEPENDENT_AMBULATORY_CARE_PROVIDER_SITE_OTHER): Payer: BLUE CROSS/BLUE SHIELD | Admitting: General Surgery

## 2016-03-25 VITALS — BP 177/83 | HR 89 | Temp 97.8°F | Ht 66.0 in | Wt 300.6 lb

## 2016-03-25 DIAGNOSIS — Z9013 Acquired absence of bilateral breasts and nipples: Secondary | ICD-10-CM

## 2016-03-25 NOTE — Progress Notes (Signed)
Outpatient Surgical Follow Up  03/25/2016  Kaylee Reyes is an 65 y.o. female.   Chief Complaint  Patient presents with  . Follow-up    Bilateral Breast wounds    HPI: 65 year old female returns to clinic for additional wound check of bilateral breasts). She is 5 months status post bilateral mastectomy. She's been having ongoing wound care and wound complications since mastectomy. She is slated to start radiation therapy but radiation oncologist required her wound to be completely healed prior to starting radiation. At her last visit she had silver nitrate applied to her draining wounds with market improvement. Patient has had scant drainage of a clear to serous fluid from bilateral sites over the last week and appears to be improving. She denies any fevers, chills, nausea, vomiting, diarrhea, constipation. Does have consistent continued intermittent pains to the operative sites. She had chest pains last week and was evaluated by oncology for this. She also discussed sensation of swelling to her lower extremity today.  Past Medical History  Diagnosis Date  . Hypertension   . GERD (gastroesophageal reflux disease)   . Thyroid disease   . Hyperlipidemia   . History of chicken pox   . Hypothyroidism   . Cancer [facility])     breast cancer    Past Surgical History  Procedure Laterality Date  . Cholecystectomy  1990  . Wrist surgery    . Cesarean section  1988  . Exploratory laparotomy      Left breast  . Abdominal hysterectomy  07/13/2013    Total. with BSO for postmenopausal bleeding and fibroids with large cervical polyp. Dr. Levy Sjogren and Dr. Glennon Mac at [facility]  . Breast surgery  2011    BREAST BIOPSY  . Abdominal ultrasound  04/17/2011    Hepatomegaly with borderline slenomegaly. Suggestive of fatty liver. s/p cholecystectomy. Portions of aorta obscured  . Mastectomy w/ sentinel node biopsy Bilateral 10/28/2015    Procedure: MASTECTOMY WITH SENTINEL LYMPH NODE BIOPSY;  Surgeon:  Hubbard Robinson, MD;  Location: [facility] ORS;  Service: General;  Laterality: Bilateral;  . Portacath placement Left 12/23/2015    Procedure: INSERTION PORT-A-CATH;  Surgeon: Hubbard Robinson, MD;  Location: [facility] ORS;  Service: General;  Laterality: Left;    Family History  Problem Relation Age of Onset  . Hypertension Mother   . Thyroid disease Mother   . Hypertension Brother   . Diabetes Brother     Social History:  reports that she has never smoked. She has never used smokeless tobacco. She reports that she does not drink alcohol or use illicit drugs.  Allergies:  Allergies  Allergen Reactions  . Penicillins Shortness Of Breath  . Nickel Hives  . Pravastatin Sodium     Muscle pain, muscle cramps, muscle weakness    Medications reviewed.    ROS A multipoint review of systems was completed, all pertinent positives and negatives are documented within the history of present illness and remainder negative.   Pulse 89  Temp(Src) 97.8 F (36.6 C) (Oral)  Ht 5\' 6"  (1.676 m)  Wt 136.351 kg (300 lb 9.6 oz)  BMI 48.54 kg/m2  Physical Exam Gen.: No acute distress Breasts: Bilateral mastectomy sites visualized again today. Complex contracted scars bilaterally. There is a 2 x 0.5 cm area of continued wound healing on the patient's left and there is a 3 x 0.5 cm area on the patient's right. The dressing is scant and much improved from last visit. Chest: Clear to auscultation Heart: Regular rate  and rhythm Abdomen: Soft and nontender    No results found for this or any previous visit (from the past 48 hour(s)). No results found.  Assessment/Plan:  1. S/P bilateral mastectomy 65 year old female with complex wound status post bilateral mastectomy. No indication for silver nitrate application today. Discussed anticipated complete healing within the next couple of weeks that should allow her to start radiation therapy. However given the need for complete healing plan for  continued follow-up in 1 more week to further evaluate whether or not she needs continued advanced wound care.     Clayburn Pert, MD FACS General Surgeon  03/25/2016,11:30 AM

## 2016-03-25 NOTE — Patient Instructions (Signed)
Continue Dressing changes as you have been. The area is looking [place]!  Follow-up in 1 week with Dr. Adonis Huguenin as scheduled below.

## 2016-03-26 ENCOUNTER — Ambulatory Visit
Admission: RE | Admit: 2016-03-26 | Discharge: 2016-03-26 | Disposition: A | Discharge status: Home / Self Care | Payer: BLUE CROSS/BLUE SHIELD | Source: Ambulatory Visit | Attending: Internal Medicine | Admitting: Internal Medicine

## 2016-03-26 ENCOUNTER — Telehealth: Payer: Self-pay | Admitting: *Deleted

## 2016-03-26 DIAGNOSIS — M7989 Other specified soft tissue disorders: Secondary | ICD-10-CM

## 2016-03-26 DIAGNOSIS — C50911 Malignant neoplasm of unspecified site of right female breast: Secondary | ICD-10-CM

## 2016-03-26 DIAGNOSIS — C50912 Malignant neoplasm of unspecified site of left female breast: Secondary | ICD-10-CM

## 2016-03-26 NOTE — Telephone Encounter (Signed)
Called to report that she has had swelling in her left lower leg up to her knee for 3 days and it got so bad last night that she could hardly get her shoe on. She reports that she does have a little swelling in her right ankle but not bad. She reports that she takes HCTZ for swelling and wants to know if that can be increased or what else needs to be done. The swelling does go down some with elevation of her feet. Denies pain in the legs. She reports that she had some SOB Monday, but that has improved. Please advise.

## 2016-03-26 NOTE — Telephone Encounter (Signed)
Doppler BLE  Per Dr Rogue Bussing. Order sent to scheduling Appt for 145 agreed on by patient

## 2016-03-26 NOTE — Telephone Encounter (Addendum)
I contacted patient back with her u/s results. I told the patient that her bil. Lower extremity u/s was negative for a blot.  I encouraged pt to finish taking her oral potassium, and to continue elevating her legs. She states that her leg swelling slightly improves with elevation. She admits to being on her feet frequently. Her husband had a medical procedure yesterday. She sat down for several hours in waiting room. She does admit to having varicose veins. She understands that sitting and standing for long periods of time can definitely contribute to lower extremity edema.  I suggested stockings/compression hose; she stated, "at this time, I would rather not wear any stocking due to the hot weather."  She thanked me for calling her back with the results.

## 2016-03-31 ENCOUNTER — Ambulatory Visit: Payer: BLUE CROSS/BLUE SHIELD | Admitting: Radiation Oncology

## 2016-03-31 ENCOUNTER — Inpatient Hospital Stay
Admission: RE | Admit: 2016-03-31 | Payer: BLUE CROSS/BLUE SHIELD | Source: Ambulatory Visit | Admitting: Radiation Oncology

## 2016-04-01 ENCOUNTER — Encounter: Payer: Self-pay | Admitting: General Surgery

## 2016-04-01 ENCOUNTER — Ambulatory Visit (INDEPENDENT_AMBULATORY_CARE_PROVIDER_SITE_OTHER): Payer: BLUE CROSS/BLUE SHIELD | Admitting: General Surgery

## 2016-04-01 VITALS — BP 172/99 | HR 76 | Temp 97.7°F | Wt 299.4 lb

## 2016-04-01 DIAGNOSIS — Z9013 Acquired absence of bilateral breasts and nipples: Secondary | ICD-10-CM

## 2016-04-01 NOTE — Patient Instructions (Signed)
We will see you back in 1 week for a wound check.

## 2016-04-01 NOTE — Progress Notes (Signed)
Outpatient Surgical Follow Up  04/01/2016  Kaylee Reyes is an 65 y.o. female.   Chief Complaint  Patient presents with  . Follow-up    wound Check    HPI: 65 year old female returns to clinic for additional wound check from bilateral mastectomy scars. Patient reports she still having some drainage on the dressings with the right being more than the left. However the drainage is continued to decrease. She denies any fevers, chills, nausea, vomiting, diarrhea, constipation. She is very interested in meeting down with the process as well as possible and frustrated that it continues to have drainage.  Past Medical History  Diagnosis Date  . Hypertension   . GERD (gastroesophageal reflux disease)   . Thyroid disease   . Hyperlipidemia   . History of chicken pox   . Hypothyroidism   . Cancer [facility])     breast cancer    Past Surgical History  Procedure Laterality Date  . Cholecystectomy  1990  . Wrist surgery    . Cesarean section  1988  . Exploratory laparotomy      Left breast  . Abdominal hysterectomy  07/13/2013    Total. with BSO for postmenopausal bleeding and fibroids with large cervical polyp. Dr. Levy Sjogren and Dr. Glennon Mac at [facility]  . Breast surgery  2011    BREAST BIOPSY  . Abdominal ultrasound  04/17/2011    Hepatomegaly with borderline slenomegaly. Suggestive of fatty liver. s/p cholecystectomy. Portions of aorta obscured  . Mastectomy w/ sentinel node biopsy Bilateral 10/28/2015    Procedure: MASTECTOMY WITH SENTINEL LYMPH NODE BIOPSY;  Surgeon: Hubbard Robinson, MD;  Location: [facility] ORS;  Service: General;  Laterality: Bilateral;  . Portacath placement Left 12/23/2015    Procedure: INSERTION PORT-A-CATH;  Surgeon: Hubbard Robinson, MD;  Location: [facility] ORS;  Service: General;  Laterality: Left;    Family History  Problem Relation Age of Onset  . Hypertension Mother   . Thyroid disease Mother   . Hypertension Brother   . Diabetes Brother     Social History:   reports that she has never smoked. She has never used smokeless tobacco. She reports that she does not drink alcohol or use illicit drugs.  Allergies:  Allergies  Allergen Reactions  . Penicillins Shortness Of Breath  . Nickel Hives  . Pravastatin Sodium     Muscle pain, muscle cramps, muscle weakness    Medications reviewed.    ROS A multipoint review of systems was completed. All pertinent positives and negatives are documented in the history of present illness the remainder negative.   BP 172/99 mmHg  Pulse 76  Temp(Src) 97.7 F (36.5 C) (Oral)  Wt 135.807 kg (299 lb 6.4 oz)  Physical Exam  Gen.: No acute distress Breast: Bilateral breast exams performed. Bilateral breast surgically absent with complex wound from postoperative wound breakdown. The left mastectomy scar appears to be fully epithelialized with evidence of a small blister in the middle of the incision site. No active drainage or hyper granulation tissue. Right sided scar with a 0.1 x 0.9 cm area of granulation tissue. Otherwise fully epithelialized. No evidence of infection or cellulitis to bilateral sites Chest: Clear to auscultation Heart: Regular rate and rhythm Abdomen: Soft and nontender  No results found for this or any previous visit (from the past 48 hour(s)). No results found.  Assessment/Plan:  1. S/P bilateral mastectomy 65 year old female status post bilateral mastectomy. Continues to deal with bilateral mastectomy wounds that need to be fully healed prior  to starting radiation. The right sided area of granulation tissue was treated with silver nitrate today. This is much smaller than a hasn't passed and she was commended on her excellent care she is ready at home and encourage that it is healing. She will follow-up in clinic in 1 week for additional wound check with Dr. Jan Fireman, MD [facility] General Surgeon  04/01/2016,11:53 AM

## 2016-04-07 ENCOUNTER — Ambulatory Visit: Payer: BLUE CROSS/BLUE SHIELD | Admitting: Radiation Oncology

## 2016-04-07 ENCOUNTER — Inpatient Hospital Stay
Admission: RE | Admit: 2016-04-07 | Payer: BLUE CROSS/BLUE SHIELD | Source: Ambulatory Visit | Admitting: Radiation Oncology

## 2016-04-08 ENCOUNTER — Ambulatory Visit (INDEPENDENT_AMBULATORY_CARE_PROVIDER_SITE_OTHER): Payer: BLUE CROSS/BLUE SHIELD | Admitting: Surgery

## 2016-04-08 ENCOUNTER — Encounter: Payer: Self-pay | Admitting: Surgery

## 2016-04-08 VITALS — BP 165/79 | HR 80 | Temp 98.4°F | Ht 66.0 in | Wt 304.0 lb

## 2016-04-08 DIAGNOSIS — C50911 Malignant neoplasm of unspecified site of right female breast: Secondary | ICD-10-CM

## 2016-04-08 NOTE — Progress Notes (Signed)
65 yr old female with left lobular CA who had bilateral mastectomy and wound complications.  She has completed her chemo and now waiting complete wound healing before starting Rx.  She is anxious about starting Rx.  We also discussed the need for the tamoxifen and that since she has had hysterectomy she does not have any additional risk for endometrial cancer.    Filed Vitals:   04/08/16 0900  BP: 165/79  Pulse: 80  Temp: 98.4 F (36.9 C)   PE:  Gen: NAD Chest wall: right mastectomy site completely healed, left mastectomy site with 1cm area of scab, good granualtion tissue, close to completely healed, no masses, no lymphadenopathy in either chest wall or axilla  A/P:  She is doing well, she should be ready for Rx as soon as Dr. Donella Stade feels appropriate.  I will have her f/u in November since today was a 6 month f/u from operation.

## 2016-04-08 NOTE — Patient Instructions (Signed)
We will see you back in six months for a follow up. If you have questions or concerns please feel free to call our office.

## 2016-04-12 ENCOUNTER — Inpatient Hospital Stay: Payer: BLUE CROSS/BLUE SHIELD | Attending: Oncology

## 2016-04-12 DIAGNOSIS — D649 Anemia, unspecified: Secondary | ICD-10-CM | POA: Insufficient documentation

## 2016-04-12 DIAGNOSIS — C50911 Malignant neoplasm of unspecified site of right female breast: Secondary | ICD-10-CM | POA: Insufficient documentation

## 2016-04-12 DIAGNOSIS — Z9013 Acquired absence of bilateral breasts and nipples: Secondary | ICD-10-CM | POA: Diagnosis not present

## 2016-04-12 DIAGNOSIS — Z9221 Personal history of antineoplastic chemotherapy: Secondary | ICD-10-CM | POA: Diagnosis not present

## 2016-04-12 DIAGNOSIS — Z170 Estrogen receptor positive status [ER+]: Secondary | ICD-10-CM | POA: Insufficient documentation

## 2016-04-12 DIAGNOSIS — C801 Malignant (primary) neoplasm, unspecified: Secondary | ICD-10-CM

## 2016-04-12 DIAGNOSIS — E876 Hypokalemia: Secondary | ICD-10-CM | POA: Diagnosis not present

## 2016-04-12 DIAGNOSIS — Z79899 Other long term (current) drug therapy: Secondary | ICD-10-CM | POA: Diagnosis not present

## 2016-04-12 MED ORDER — HEPARIN SOD (PORK) LOCK FLUSH 100 UNIT/ML IV SOLN
500.0000 [IU] | Freq: Once | INTRAVENOUS | Status: AC
Start: 2016-04-12 — End: 2016-04-12
  Administered 2016-04-12: 500 [IU] via INTRAVENOUS
  Filled 2016-04-12: qty 5

## 2016-04-14 ENCOUNTER — Encounter: Payer: Self-pay | Admitting: Radiation Oncology

## 2016-04-14 ENCOUNTER — Ambulatory Visit
Admission: RE | Admit: 2016-04-14 | Discharge: 2016-04-14 | Disposition: A | Discharge status: Home / Self Care | Payer: BLUE CROSS/BLUE SHIELD | Source: Ambulatory Visit | Attending: Radiation Oncology | Admitting: Radiation Oncology

## 2016-04-14 VITALS — BP 161/93 | HR 79 | Wt 306.9 lb

## 2016-04-14 DIAGNOSIS — C50912 Malignant neoplasm of unspecified site of left female breast: Secondary | ICD-10-CM

## 2016-04-14 DIAGNOSIS — C50911 Malignant neoplasm of unspecified site of right female breast: Secondary | ICD-10-CM | POA: Insufficient documentation

## 2016-04-14 DIAGNOSIS — Z510 Encounter for antineoplastic radiation therapy: Secondary | ICD-10-CM | POA: Insufficient documentation

## 2016-04-14 DIAGNOSIS — Z170 Estrogen receptor positive status [ER+]: Secondary | ICD-10-CM | POA: Insufficient documentation

## 2016-04-14 NOTE — Progress Notes (Signed)
Radiation Oncology Follow up Note  Name: Kaylee Reyes   Date:   04/14/2016 MRN:  [id number] DOB: [birth year]    This 65 y.o. female presents to the clinic today for follow-up for right chest wall peripheral lymphatic radiation for stage IIB (T2 N1 M0) ER/PR positive HER-2/neu negative invasive lobular carcinoma with mixed invasive lobular features status post bilateral mastectomies and adjuvant chemotherapy.  REFERRING PROVIDER: Birdie Sons, MD  HPI: Patient is a 65 year old female originally consult in back in April where it was decided to pursue right chest wall peripheral hepatic radiation after she underwent bilateral mastectomies for a 3.6 cm grade 2 invasive lobular carcinoma with ductal carcinoma in situ of the right breast. 2 of 6 sentinel lymph nodes were positive for metastatic disease with one node measuring 2.4 cm. She'll 14 cycles of Cytoxan and Taxotere. Original treatment planning was delayed based on poor healing of her mastectomy site. She is seen today and skin is healing well. Still some slight granulation tissue in the middle of her scars bilaterally. She is otherwise doing well..  COMPLICATIONS OF TREATMENT: none  FOLLOW UP COMPLIANCE: keeps appointments   PHYSICAL EXAM:  BP 161/93 mmHg  Pulse 79  Wt 306 lb 14.1 oz (139.2 kg) Patient is status post bilateral mastectomies incision is now healed well. Still slight granulation tissue in the center of both scars. No axillary or supra clavicular adenopathy is noted bilaterally no chest wall masses of or nodularity are noted. Patient is obese Well-developed well-nourished patient in NAD. HEENT reveals PERLA, EOMI, discs not visualized.  Oral cavity is clear. No oral mucosal lesions are identified. Neck is clear without evidence of cervical or supraclavicular adenopathy. Lungs are clear to A&P. Cardiac examination is essentially unremarkable with regular rate and rhythm without murmur rub or thrill. Abdomen is benign with  no organomegaly or masses noted. Motor sensory and DTR levels are equal and symmetric in the upper and lower extremities. Cranial nerves II through XII are grossly intact. Proprioception is intact. No peripheral adenopathy or edema is identified. No motor or sensory levels are noted. Crude visual fields are within normal range.  RADIOLOGY RESULTS: No current films for review  PLAN: At the present time will go ahead with radiation therapy as planned. Would plan on delivering 5000 cGy to her chest wall and peripheral lymphatics. We will also boost her scar another 1400 cGy using electrons. I personally ordered CT simulation for next week. Risks and benefits of treatment again were reviewed as were originally outlined to the patient.  I would like to take this opportunity for allowing me to participate in the care of your patient.Armstead Peaks., MD

## 2016-04-19 ENCOUNTER — Ambulatory Visit
Admission: RE | Admit: 2016-04-19 | Discharge: 2016-04-19 | Disposition: A | Discharge status: Home / Self Care | Payer: BLUE CROSS/BLUE SHIELD | Source: Ambulatory Visit | Attending: Radiation Oncology | Admitting: Radiation Oncology

## 2016-04-19 ENCOUNTER — Encounter: Payer: Self-pay | Admitting: *Deleted

## 2016-04-19 DIAGNOSIS — C50911 Malignant neoplasm of unspecified site of right female breast: Secondary | ICD-10-CM | POA: Diagnosis present

## 2016-04-19 DIAGNOSIS — Z170 Estrogen receptor positive status [ER+]: Secondary | ICD-10-CM | POA: Diagnosis not present

## 2016-04-19 DIAGNOSIS — Z510 Encounter for antineoplastic radiation therapy: Secondary | ICD-10-CM | POA: Diagnosis not present

## 2016-04-22 DIAGNOSIS — C50911 Malignant neoplasm of unspecified site of right female breast: Secondary | ICD-10-CM | POA: Diagnosis not present

## 2016-04-23 ENCOUNTER — Other Ambulatory Visit: Payer: Self-pay | Admitting: *Deleted

## 2016-04-23 DIAGNOSIS — C50911 Malignant neoplasm of unspecified site of right female breast: Secondary | ICD-10-CM

## 2016-04-26 ENCOUNTER — Ambulatory Visit
Admission: RE | Admit: 2016-04-26 | Discharge: 2016-04-26 | Disposition: A | Discharge status: Home / Self Care | Payer: BLUE CROSS/BLUE SHIELD | Source: Ambulatory Visit | Attending: Radiation Oncology | Admitting: Radiation Oncology

## 2016-04-26 DIAGNOSIS — C50911 Malignant neoplasm of unspecified site of right female breast: Secondary | ICD-10-CM | POA: Diagnosis not present

## 2016-04-27 ENCOUNTER — Ambulatory Visit
Admission: RE | Admit: 2016-04-27 | Discharge: 2016-04-27 | Disposition: A | Discharge status: Home / Self Care | Payer: BLUE CROSS/BLUE SHIELD | Source: Ambulatory Visit | Attending: Radiation Oncology | Admitting: Radiation Oncology

## 2016-04-27 DIAGNOSIS — C50911 Malignant neoplasm of unspecified site of right female breast: Secondary | ICD-10-CM | POA: Diagnosis not present

## 2016-04-28 ENCOUNTER — Ambulatory Visit
Admission: RE | Admit: 2016-04-28 | Discharge: 2016-04-28 | Disposition: A | Discharge status: Home / Self Care | Payer: BLUE CROSS/BLUE SHIELD | Source: Ambulatory Visit | Attending: Radiation Oncology | Admitting: Radiation Oncology

## 2016-04-28 ENCOUNTER — Other Ambulatory Visit: Payer: Self-pay | Admitting: *Deleted

## 2016-04-28 DIAGNOSIS — C50911 Malignant neoplasm of unspecified site of right female breast: Secondary | ICD-10-CM | POA: Diagnosis not present

## 2016-04-28 DIAGNOSIS — R222 Localized swelling, mass and lump, trunk: Secondary | ICD-10-CM

## 2016-04-29 ENCOUNTER — Ambulatory Visit
Admission: RE | Admit: 2016-04-29 | Discharge: 2016-04-29 | Disposition: A | Discharge status: Home / Self Care | Payer: BLUE CROSS/BLUE SHIELD | Source: Ambulatory Visit | Attending: Radiation Oncology | Admitting: Radiation Oncology

## 2016-04-29 ENCOUNTER — Other Ambulatory Visit: Payer: Self-pay | Admitting: *Deleted

## 2016-04-29 DIAGNOSIS — C50911 Malignant neoplasm of unspecified site of right female breast: Secondary | ICD-10-CM | POA: Diagnosis not present

## 2016-04-29 DIAGNOSIS — R222 Localized swelling, mass and lump, trunk: Secondary | ICD-10-CM | POA: Insufficient documentation

## 2016-04-29 DIAGNOSIS — C50919 Malignant neoplasm of unspecified site of unspecified female breast: Secondary | ICD-10-CM | POA: Insufficient documentation

## 2016-04-29 LAB — POCT I-STAT CREATININE: CREATININE: 0.8 mg/dL (ref 0.44–1.00)

## 2016-04-29 MED ORDER — IOPAMIDOL (ISOVUE-370) INJECTION 76%
75.0000 mL | Freq: Once | INTRAVENOUS | Status: AC | PRN
  Administered 2016-04-29: 75 mL via INTRAVENOUS

## 2016-04-30 ENCOUNTER — Ambulatory Visit
Admission: RE | Admit: 2016-04-30 | Discharge: 2016-04-30 | Disposition: A | Discharge status: Home / Self Care | Payer: BLUE CROSS/BLUE SHIELD | Source: Ambulatory Visit | Attending: Radiation Oncology | Admitting: Radiation Oncology

## 2016-04-30 DIAGNOSIS — C50911 Malignant neoplasm of unspecified site of right female breast: Secondary | ICD-10-CM | POA: Diagnosis not present

## 2016-05-04 ENCOUNTER — Ambulatory Visit
Admission: RE | Admit: 2016-05-04 | Discharge: 2016-05-04 | Disposition: A | Discharge status: Home / Self Care | Payer: BLUE CROSS/BLUE SHIELD | Source: Ambulatory Visit | Attending: Radiation Oncology | Admitting: Radiation Oncology

## 2016-05-04 DIAGNOSIS — C50911 Malignant neoplasm of unspecified site of right female breast: Secondary | ICD-10-CM | POA: Diagnosis not present

## 2016-05-05 ENCOUNTER — Ambulatory Visit
Admission: RE | Admit: 2016-05-05 | Discharge: 2016-05-05 | Disposition: A | Discharge status: Home / Self Care | Payer: BLUE CROSS/BLUE SHIELD | Source: Ambulatory Visit | Attending: Radiation Oncology | Admitting: Radiation Oncology

## 2016-05-05 DIAGNOSIS — C50911 Malignant neoplasm of unspecified site of right female breast: Secondary | ICD-10-CM | POA: Diagnosis not present

## 2016-05-06 ENCOUNTER — Inpatient Hospital Stay: Payer: BLUE CROSS/BLUE SHIELD | Attending: Radiation Oncology

## 2016-05-06 ENCOUNTER — Ambulatory Visit
Admission: RE | Admit: 2016-05-06 | Discharge: 2016-05-06 | Disposition: A | Discharge status: Home / Self Care | Payer: BLUE CROSS/BLUE SHIELD | Source: Ambulatory Visit | Attending: Radiation Oncology | Admitting: Radiation Oncology

## 2016-05-06 DIAGNOSIS — Z170 Estrogen receptor positive status [ER+]: Secondary | ICD-10-CM | POA: Insufficient documentation

## 2016-05-06 DIAGNOSIS — C50912 Malignant neoplasm of unspecified site of left female breast: Secondary | ICD-10-CM

## 2016-05-06 DIAGNOSIS — C50911 Malignant neoplasm of unspecified site of right female breast: Secondary | ICD-10-CM | POA: Insufficient documentation

## 2016-05-06 DIAGNOSIS — Z452 Encounter for adjustment and management of vascular access device: Secondary | ICD-10-CM | POA: Insufficient documentation

## 2016-05-06 LAB — CBC WITH DIFFERENTIAL/PLATELET
BASOS PCT: 0 %
Basophils Absolute: 0 10*3/uL (ref 0–0.1)
Eosinophils Absolute: 0.1 10*3/uL (ref 0–0.7)
Eosinophils Relative: 2 %
HEMATOCRIT: 31.5 % — AB (ref 35.0–47.0)
HEMOGLOBIN: 10.6 g/dL — AB (ref 12.0–16.0)
LYMPHS ABS: 0.8 10*3/uL — AB (ref 1.0–3.6)
Lymphocytes Relative: 12 %
MCH: 28.4 pg (ref 26.0–34.0)
MCHC: 33.5 g/dL (ref 32.0–36.0)
MCV: 84.9 fL (ref 80.0–100.0)
MONOS PCT: 5 %
Monocytes Absolute: 0.3 10*3/uL (ref 0.2–0.9)
NEUTROS ABS: 5.1 10*3/uL (ref 1.4–6.5)
NEUTROS PCT: 81 %
Platelets: 180 10*3/uL (ref 150–440)
RBC: 3.71 MIL/uL — ABNORMAL LOW (ref 3.80–5.20)
RDW: 15.5 % — ABNORMAL HIGH (ref 11.5–14.5)
WBC: 6.3 10*3/uL (ref 3.6–11.0)

## 2016-05-06 LAB — COMPREHENSIVE METABOLIC PANEL
ALBUMIN: 4 g/dL (ref 3.5–5.0)
ALK PHOS: 71 U/L (ref 38–126)
ALT: 20 U/L (ref 14–54)
ANION GAP: 7 (ref 5–15)
AST: 24 U/L (ref 15–41)
BUN: 13 mg/dL (ref 6–20)
CALCIUM: 9 mg/dL (ref 8.9–10.3)
CHLORIDE: 102 mmol/L (ref 101–111)
CO2: 28 mmol/L (ref 22–32)
Creatinine, Ser: 0.8 mg/dL (ref 0.44–1.00)
GFR calc non Af Amer: >60 mL/min (ref >60)
GLUCOSE: 125 mg/dL — AB (ref 65–99)
POTASSIUM: 3.4 mmol/L — AB (ref 3.5–5.1)
Sodium: 137 mmol/L (ref 135–145)
Total Bilirubin: 0.6 mg/dL (ref 0.3–1.2)
Total Protein: 7.9 g/dL (ref 6.5–8.1)

## 2016-05-07 ENCOUNTER — Ambulatory Visit
Admission: RE | Admit: 2016-05-07 | Discharge: 2016-05-07 | Disposition: A | Discharge status: Home / Self Care | Payer: BLUE CROSS/BLUE SHIELD | Source: Ambulatory Visit | Attending: Radiation Oncology | Admitting: Radiation Oncology

## 2016-05-07 DIAGNOSIS — C50911 Malignant neoplasm of unspecified site of right female breast: Secondary | ICD-10-CM | POA: Diagnosis not present

## 2016-05-09 ENCOUNTER — Other Ambulatory Visit: Payer: Self-pay | Admitting: Oncology

## 2016-05-09 DIAGNOSIS — E876 Hypokalemia: Secondary | ICD-10-CM

## 2016-05-10 ENCOUNTER — Ambulatory Visit
Admission: RE | Admit: 2016-05-10 | Discharge: 2016-05-10 | Disposition: A | Discharge status: Home / Self Care | Payer: BLUE CROSS/BLUE SHIELD | Source: Ambulatory Visit | Attending: Radiation Oncology | Admitting: Radiation Oncology

## 2016-05-10 DIAGNOSIS — C50911 Malignant neoplasm of unspecified site of right female breast: Secondary | ICD-10-CM | POA: Diagnosis not present

## 2016-05-11 ENCOUNTER — Other Ambulatory Visit: Payer: Self-pay | Admitting: General Surgery

## 2016-05-11 ENCOUNTER — Ambulatory Visit
Admission: RE | Admit: 2016-05-11 | Discharge: 2016-05-11 | Disposition: A | Discharge status: Home / Self Care | Payer: BLUE CROSS/BLUE SHIELD | Source: Ambulatory Visit | Attending: Radiation Oncology | Admitting: Radiation Oncology

## 2016-05-11 DIAGNOSIS — C50911 Malignant neoplasm of unspecified site of right female breast: Secondary | ICD-10-CM | POA: Diagnosis not present

## 2016-05-12 ENCOUNTER — Ambulatory Visit (HOSPITAL_COMMUNITY)
Admission: RE | Admit: 2016-05-12 | Discharge: 2016-05-12 | Disposition: A | Discharge status: Home / Self Care | Payer: BLUE CROSS/BLUE SHIELD | Source: Ambulatory Visit | Attending: Radiation Oncology | Admitting: Radiation Oncology

## 2016-05-12 ENCOUNTER — Encounter (HOSPITAL_COMMUNITY): Payer: Self-pay

## 2016-05-12 ENCOUNTER — Other Ambulatory Visit: Payer: Self-pay | Admitting: *Deleted

## 2016-05-12 ENCOUNTER — Ambulatory Visit
Admission: RE | Admit: 2016-05-12 | Discharge: 2016-05-12 | Disposition: A | Discharge status: Home / Self Care | Payer: BLUE CROSS/BLUE SHIELD | Source: Ambulatory Visit | Attending: Radiation Oncology | Admitting: Radiation Oncology

## 2016-05-12 DIAGNOSIS — L02213 Cutaneous abscess of chest wall: Secondary | ICD-10-CM | POA: Insufficient documentation

## 2016-05-12 DIAGNOSIS — C50911 Malignant neoplasm of unspecified site of right female breast: Secondary | ICD-10-CM

## 2016-05-12 LAB — CBC
HCT: 32.1 % — ABNORMAL LOW (ref 36.0–46.0)
Hemoglobin: 9.7 g/dL — ABNORMAL LOW (ref 12.0–15.0)
MCH: 26.7 pg (ref 26.0–34.0)
MCHC: 30.2 g/dL (ref 30.0–36.0)
MCV: 88.4 fL (ref 78.0–100.0)
PLATELETS: 195 10*3/uL (ref 150–400)
RBC: 3.63 MIL/uL — ABNORMAL LOW (ref 3.87–5.11)
RDW: 14 % (ref 11.5–15.5)
WBC: 7.6 10*3/uL (ref 4.0–10.5)

## 2016-05-12 LAB — APTT: aPTT: 30 seconds (ref 24–37)

## 2016-05-12 LAB — PROTIME-INR
INR: 1.21 (ref 0.00–1.49)
PROTHROMBIN TIME: 15.5 s — AB (ref 11.6–15.2)

## 2016-05-12 MED ORDER — LIDOCAINE HCL (PF) 1 % IJ SOLN
INTRAMUSCULAR | Status: AC
  Filled 2016-05-12: qty 30

## 2016-05-12 MED ORDER — LIDOCAINE HCL (PF) 1 % IJ SOLN
INTRAMUSCULAR | Status: AC
  Filled 2016-05-12: qty 5

## 2016-05-12 MED ORDER — HYDROCODONE-ACETAMINOPHEN 5-325 MG PO TABS
1.0000 | ORAL_TABLET | Freq: Once | ORAL | Status: AC
  Administered 2016-05-12: 1 via ORAL

## 2016-05-12 MED ORDER — MIDAZOLAM HCL 2 MG/2ML IJ SOLN
INTRAMUSCULAR | Status: AC
  Filled 2016-05-12: qty 4

## 2016-05-12 MED ORDER — MIDAZOLAM HCL 2 MG/2ML IJ SOLN
INTRAMUSCULAR | Status: AC | PRN
Start: 2016-05-12 — End: 2016-05-12
  Administered 2016-05-12: 1 mg via INTRAVENOUS

## 2016-05-12 MED ORDER — FENTANYL CITRATE (PF) 100 MCG/2ML IJ SOLN
INTRAMUSCULAR | Status: AC | PRN
  Administered 2016-05-12: 50 ug via INTRAVENOUS

## 2016-05-12 MED ORDER — HYDROCODONE-ACETAMINOPHEN 5-325 MG PO TABS
ORAL_TABLET | ORAL | Status: DC
Start: 2016-05-12 — End: 2016-05-13
  Filled 2016-05-12: qty 1

## 2016-05-12 MED ORDER — FENTANYL CITRATE (PF) 100 MCG/2ML IJ SOLN
INTRAMUSCULAR | Status: AC
  Filled 2016-05-12: qty 4

## 2016-05-12 MED ORDER — CLINDAMYCIN HCL 300 MG PO CAPS: 300.0000 mg | ORAL_CAPSULE | Freq: Three times a day (TID) | ORAL | Status: DC

## 2016-05-12 MED ORDER — SODIUM CHLORIDE 0.9 % IV SOLN
INTRAVENOUS | Status: DC
  Administered 2016-05-12: 10:00:00 via INTRAVENOUS

## 2016-05-12 NOTE — Discharge Instructions (Signed)
Needle Biopsy, Care After °Refer to this sheet in the next few weeks. These instructions provide you with information about caring for yourself after your procedure. Your health care provider may also give you more specific instructions. Your treatment has been planned according to current medical practices, but problems sometimes occur. Call your health care provider if you have any problems or questions after your procedure. °WHAT TO EXPECT AFTER THE PROCEDURE °After your procedure, it is common to have soreness, bruising, or mild pain at the biopsy site. This should go away in a few days. °HOME CARE INSTRUCTIONS °· Rest as directed by your health care provider. °· Take medicines only as directed by your health care provider. °· There are many different ways to close and cover the biopsy site, including stitches (sutures), skin glue, and adhesive strips. Follow your health care provider's instructions about: °¨ Biopsy site care. °¨ Bandage (dressing) changes and removal. °¨ Biopsy site closure removal. °· Check your biopsy site every day for signs of infection. Watch for: °¨ Redness, swelling, or pain. °¨ Fluid, blood, or pus. °SEEK MEDICAL CARE IF: °· You have a fever. °· You have redness, swelling, or pain at the biopsy site that lasts longer than a few days. °· You have fluid, blood, or pus coming from the biopsy site. °· You feel nauseous. °· You vomit. °SEEK IMMEDIATE MEDICAL CARE IF: °· You have shortness of breath. °· You have trouble breathing. °· You have chest pain.   °· You feel dizzy or you faint. °· You have bleeding that does not stop with pressure or a bandage. °· You cough up blood. °· You have pain in your abdomen. °  °This information is not intended to replace advice given to you by your health care provider. Make sure you discuss any questions you have with your health care provider. °  °Document Released: 04/08/2015 Document Reviewed: 04/08/2015 °Elsevier Interactive Patient Education ©2016  Elsevier Inc. ° °

## 2016-05-12 NOTE — Procedures (Signed)
S/p [place] rt chest wall abscess aspiration No comp 10cc pus aspirated   Stable Full report in PACS

## 2016-05-12 NOTE — Sedation Documentation (Signed)
10cc purulent drainage aspirated

## 2016-05-12 NOTE — Sedation Documentation (Addendum)
In US 

## 2016-05-12 NOTE — Sedation Documentation (Signed)
MD at bedside. 

## 2016-05-12 NOTE — H&P (Signed)
Chief Complaint: chest wall fluid collection  Referring Physician:Dr. Noreene Filbert  Supervising Physician: Daryll Brod  Patient Status: Out-pt  HPI: Kaylee Reyes is an 65 y.o. female who is s/p bilateral mastectomy for lobular cell breast carcinoma.  She underwent surgery back in November, but has taken until about a month ago to heal her mastectomy wounds.  She has been treated with chemotherapy, but is now needing radiation.  She started this about 10 days ago and after the first treatment she noticed a "knot" in the central portion of her chest.  She had a CT of the chest that revealed a small fluid collection, likely post op seroma.  We have been asked to evaluate this for aspiration and to send this for cytology.  In the last 10 days this has increasing gown in size and become more tender.  She denies fevers, but this has become increasing hyperemic as well.    Past Medical History:  Past Medical History  Diagnosis Date  . Hypertension   . GERD (gastroesophageal reflux disease)   . Thyroid disease   . Hyperlipidemia   . History of chicken pox   . Hypothyroidism   . Cancer [facility])     breast cancer    Past Surgical History:  Past Surgical History  Procedure Laterality Date  . Cholecystectomy  1990  . Wrist surgery    . Cesarean section  1988  . Exploratory laparotomy      Left breast  . Abdominal hysterectomy  07/13/2013    Total. with BSO for postmenopausal bleeding and fibroids with large cervical polyp. Dr. Levy Sjogren and Dr. Glennon Mac at [facility]  . Breast surgery  2011    BREAST BIOPSY  . Abdominal ultrasound  04/17/2011    Hepatomegaly with borderline slenomegaly. Suggestive of fatty liver. s/p cholecystectomy. Portions of aorta obscured  . Mastectomy w/ sentinel node biopsy Bilateral 10/28/2015    Procedure: MASTECTOMY WITH SENTINEL LYMPH NODE BIOPSY;  Surgeon: Hubbard Robinson, MD;  Location: [facility] ORS;  Service: General;  Laterality: Bilateral;  . Portacath  placement Left 12/23/2015    Procedure: INSERTION PORT-A-CATH;  Surgeon: Hubbard Robinson, MD;  Location: [facility] ORS;  Service: General;  Laterality: Left;    Family History:  Family History  Problem Relation Age of Onset  . Hypertension Mother   . Thyroid disease Mother   . Hypertension Brother   . Diabetes Brother     Social History:  reports that she has never smoked. She has never used smokeless tobacco. She reports that she does not drink alcohol or use illicit drugs.  Allergies:  Allergies  Allergen Reactions  . Penicillins Shortness Of Breath  . Nickel Hives  . Pravastatin Sodium     Muscle pain, muscle cramps, muscle weakness    Medications:   Medication List    ASK your doctor about these medications        atenolol 50 MG tablet  Commonly known as:  TENORMIN  Take 1 tablet (50 mg total) by mouth daily.     cyclobenzaprine 5 MG tablet  Commonly known as:  FLEXERIL  Take 1 tablet (5 mg total) by mouth 3 (three) times daily as needed for muscle spasms.     docusate sodium 100 MG capsule  Commonly known as:  COLACE  Take 100 mg by mouth daily.     fexofenadine 180 MG tablet  Commonly known as:  ALLEGRA  Take 180 mg by mouth daily.  hydrochlorothiazide 25 MG tablet  Commonly known as:  HYDRODIURIL  Take 1 tablet by mouth daily.     ketoconazole 2 % cream  Commonly known as:  NIZORAL  Apply 1 application topically daily.     lansoprazole 15 MG capsule  Commonly known as:  PREVACID  Take 15 mg by mouth daily at 12 noon.     levothyroxine 75 MCG tablet  Commonly known as:  SYNTHROID, LEVOTHROID  Take 1 tablet (75 mcg total) by mouth daily.     lidocaine-prilocaine cream  Commonly known as:  EMLA  Apply to affected area once     magic mouthwash Soln  RINSE AND SPIT ONE TEASPOONFUL BY MOUTH EVERY 3 TO 4 HOURS AS NEEDED     meclizine 25 MG tablet  Commonly known as:  ANTIVERT  TAKE ONE TABLET BY MOUTH EVERY 4 TO 6 HOURS AS NEEDED      ondansetron 8 MG tablet  Commonly known as:  ZOFRAN  Take 1 tablet (8 mg total) by mouth 2 (two) times daily as needed for refractory nausea / vomiting. Start on day 3 after chemo.     oxyCODONE-acetaminophen 5-325 MG tablet  Commonly known as:  PERCOCET/ROXICET  Take 1 tablet by mouth every 8 (eight) hours as needed for severe pain.     potassium chloride SA 20 MEQ tablet  Commonly known as:  K-DUR,KLOR-CON  TAKE 1 TABLET(20 MEQ) BY MOUTH DAILY     pramoxine-hydrocortisone 1-1 % foam  Apply topically 3 (three) times daily. For hemorrhoids     prochlorperazine 10 MG tablet  Commonly known as:  COMPAZINE  Take 1 tablet (10 mg total) by mouth every 6 (six) hours as needed (Nausea or vomiting).        Please HPI for pertinent positives, otherwise complete 10 system ROS negative.  Mallampati Score: MD Evaluation Airway: WNL Heart: WNL Abdomen: WNL Chest/ Lungs: WNL ASA  Classification: 3 Mallampati/Airway Score: One  Physical Exam: BP 159/90 mmHg  Pulse 74  Temp(Src) 98.4 F (36.9 C)  Resp 18  Ht 5\' 7"  (1.702 m)  Wt 297 lb (134.718 kg)  BMI 46.51 kg/m2  SpO2 99% Body mass index is 46.51 kg/(m^2). General: pleasant, obese white female who is laying in bed in NAD HEENT: head is normocephalic, atraumatic.  Sclera are noninjected.  PERRL.  Ears and nose without any masses or lesions.  Mouth is pink and moist Heart: regular, rate, and rhythm.  Normal s1,s2. No obvious murmurs, gallops, or rubs noted.  Palpable radial and pedal pulses bilaterally Lungs/chest: CTAB, no wheezes, rhonchi, or rales noted.  Respiratory effort.  Central area of erythema and induration noted.  An area of about 5x5cm is noted that is indurated.  Her mastectomy incisions do appear healed currently. Abd: soft, NT, obese, +BS, no masses MS: all 4 extremities are symmetrical with no cyanosis, clubbing, or edema. Psych: A&Ox3 with an appropriate affect.   Labs: Results for orders placed or performed  during the hospital encounter of 05/12/16 (from the past 48 hour(s))  CBC upon arrival     Status: Abnormal   Collection Time: 05/12/16  9:50 AM  Result Value Ref Range   WBC 7.6 4.0 - 10.5 K/uL   RBC 3.63 (L) 3.87 - 5.11 MIL/uL   Hemoglobin 9.7 (L) 12.0 - 15.0 g/dL   HCT 32.1 (L) 36.0 - 46.0 %   MCV 88.4 78.0 - 100.0 fL   MCH 26.7 26.0 - 34.0 pg   MCHC 30.2  30.0 - 36.0 g/dL   RDW 14.0 11.5 - 15.5 %   Platelets 195 150 - 400 K/uL    Imaging: No results found.  Assessment/Plan 1. Chest wall fluid collection Given its current appearance, I am concerned this fluid collection may be an abscess given the appearance on her skin.  This area has also obviously grown in size since her CT scan about 10 days ago.  This is the area that she does get radiation, so she could have some changes secondary to her radiation as opposed to infection.  We will proceed with an aspiration and send the sample for cytology.  We should be able to tell if the fluid appears purulent at this time as well.  If that is the case, she may need abx therapy, but would defer that to her primary oncologist. -the procedure including risks and and complications were discussed with the patient and include, but not limited to bleeding and infection along with sedation complications which were discussed as well.  She understands and is agreeable to proceed.  Consent is signed.  Thank you for this interesting consult.  I greatly enjoyed meeting Kaylee Reyes and look forward to participating in their care.  A copy of this report was sent to the requesting provider on this date.  Electronically Signed: Henreitta Cea 05/12/2016, 10:28 AM   I spent a total of  30 Minutes   in face to face in clinical consultation, greater than 50% of which was counseling/coordinating care for chest wall fluid collection

## 2016-05-12 NOTE — Sedation Documentation (Signed)
Right chest injection site left without bandaid due to irritated red skin.  Pt requested no bandaid. No bleeding at site.  Area is red and indurated, started first day of radiation therapy.

## 2016-05-12 NOTE — Sedation Documentation (Signed)
Arrived to [facility] for US aspiration today.  Awaiting MD.  Pt and family informed of events, questions answered.

## 2016-05-13 ENCOUNTER — Ambulatory Visit
Admission: RE | Admit: 2016-05-13 | Discharge: 2016-05-13 | Disposition: A | Discharge status: Home / Self Care | Payer: BLUE CROSS/BLUE SHIELD | Source: Ambulatory Visit | Attending: Radiation Oncology | Admitting: Radiation Oncology

## 2016-05-13 DIAGNOSIS — C50911 Malignant neoplasm of unspecified site of right female breast: Secondary | ICD-10-CM | POA: Diagnosis not present

## 2016-05-14 ENCOUNTER — Ambulatory Visit
Admission: RE | Admit: 2016-05-14 | Discharge: 2016-05-14 | Disposition: A | Discharge status: Home / Self Care | Payer: BLUE CROSS/BLUE SHIELD | Source: Ambulatory Visit | Attending: Radiation Oncology | Admitting: Radiation Oncology

## 2016-05-14 ENCOUNTER — Ambulatory Visit: Payer: BLUE CROSS/BLUE SHIELD

## 2016-05-17 ENCOUNTER — Ambulatory Visit
Admission: RE | Admit: 2016-05-17 | Discharge: 2016-05-17 | Disposition: A | Discharge status: Home / Self Care | Payer: BLUE CROSS/BLUE SHIELD | Source: Ambulatory Visit | Attending: Radiation Oncology | Admitting: Radiation Oncology

## 2016-05-17 DIAGNOSIS — C50911 Malignant neoplasm of unspecified site of right female breast: Secondary | ICD-10-CM | POA: Diagnosis not present

## 2016-05-17 LAB — AEROBIC/ANAEROBIC CULTURE W GRAM STAIN (SURGICAL/DEEP WOUND)

## 2016-05-17 LAB — AEROBIC/ANAEROBIC CULTURE (SURGICAL/DEEP WOUND)

## 2016-05-18 ENCOUNTER — Ambulatory Visit
Admission: RE | Admit: 2016-05-18 | Discharge: 2016-05-18 | Disposition: A | Discharge status: Home / Self Care | Payer: BLUE CROSS/BLUE SHIELD | Source: Ambulatory Visit | Attending: Radiation Oncology | Admitting: Radiation Oncology

## 2016-05-18 DIAGNOSIS — C50911 Malignant neoplasm of unspecified site of right female breast: Secondary | ICD-10-CM | POA: Diagnosis not present

## 2016-05-19 ENCOUNTER — Ambulatory Visit
Admission: RE | Admit: 2016-05-19 | Discharge: 2016-05-19 | Disposition: A | Discharge status: Home / Self Care | Payer: BLUE CROSS/BLUE SHIELD | Source: Ambulatory Visit | Attending: Radiation Oncology | Admitting: Radiation Oncology

## 2016-05-19 DIAGNOSIS — C50911 Malignant neoplasm of unspecified site of right female breast: Secondary | ICD-10-CM | POA: Diagnosis not present

## 2016-05-20 ENCOUNTER — Ambulatory Visit
Admission: RE | Admit: 2016-05-20 | Discharge: 2016-05-20 | Disposition: A | Discharge status: Home / Self Care | Payer: BLUE CROSS/BLUE SHIELD | Source: Ambulatory Visit | Attending: Radiation Oncology | Admitting: Radiation Oncology

## 2016-05-20 ENCOUNTER — Ambulatory Visit
Admission: RE | Admit: 2016-05-20 | Discharge: 2016-05-20 | Disposition: A | Discharge status: Home / Self Care | Payer: BLUE CROSS/BLUE SHIELD | Source: Ambulatory Visit | Attending: Surgery | Admitting: Surgery

## 2016-05-20 ENCOUNTER — Telehealth: Payer: Self-pay | Admitting: Surgery

## 2016-05-20 ENCOUNTER — Inpatient Hospital Stay: Payer: BLUE CROSS/BLUE SHIELD

## 2016-05-20 ENCOUNTER — Ambulatory Visit
Admission: RE | Admit: 2016-05-20 | Discharge: 2016-05-20 | Disposition: A | Discharge status: Home / Self Care | Payer: BLUE CROSS/BLUE SHIELD | Source: Ambulatory Visit | Attending: General Surgery | Admitting: General Surgery

## 2016-05-20 DIAGNOSIS — Z95828 Presence of other vascular implants and grafts: Secondary | ICD-10-CM | POA: Diagnosis not present

## 2016-05-20 DIAGNOSIS — C50911 Malignant neoplasm of unspecified site of right female breast: Secondary | ICD-10-CM | POA: Diagnosis not present

## 2016-05-20 LAB — CBC
HCT: 31.4 % — ABNORMAL LOW (ref 35.0–47.0)
HEMOGLOBIN: 10.5 g/dL — AB (ref 12.0–16.0)
MCH: 28 pg (ref 26.0–34.0)
MCHC: 33.5 g/dL (ref 32.0–36.0)
MCV: 83.4 fL (ref 80.0–100.0)
Platelets: 214 10*3/uL (ref 150–440)
RBC: 3.76 MIL/uL — AB (ref 3.80–5.20)
RDW: 15.7 % — ABNORMAL HIGH (ref 11.5–14.5)
WBC: 6.2 10*3/uL (ref 3.6–11.0)

## 2016-05-20 MED ORDER — HEPARIN SOD (PORK) LOCK FLUSH 100 UNIT/ML IV SOLN
500.0000 [IU] | INTRAVENOUS | Status: AC | PRN
  Administered 2016-05-20: 500 [IU]

## 2016-05-20 MED ORDER — SODIUM CHLORIDE 0.9% FLUSH
10.0000 mL | INTRAVENOUS | Status: AC | PRN
  Administered 2016-05-20: 10 mL
  Filled 2016-05-20: qty 10

## 2016-05-20 NOTE — Telephone Encounter (Signed)
Patient is returning a call from [employer] regarding port removal.

## 2016-05-20 NOTE — Telephone Encounter (Signed)
Returned phone call to patient at this time. No answer. Left voicemail for return phone call. 

## 2016-05-20 NOTE — Telephone Encounter (Signed)
Patient would like to know if she has her port removed, if later she had to have chemo again could she have it replaced. There was a lot of trouble accessing it today. Please call and advise.

## 2016-05-20 NOTE — Telephone Encounter (Signed)
Phone call to patient returned. She states that when her port was flushed the nurses were having difficulty accessing her port and one of the nurses told her that it felt like the port had turned on to it's side. They were eventually able to flush the port but never got a blood return.   Spoke with Dr. Adonis Huguenin and a PA Chest X-ray has been ordered.   Explained to patient that she would need to have Chest X-ray and then I will consult with Dr. Azalee Course on Monday as to how to proceed depending on the current position of the port.

## 2016-05-21 ENCOUNTER — Telehealth: Payer: Self-pay

## 2016-05-21 ENCOUNTER — Ambulatory Visit
Admission: RE | Admit: 2016-05-21 | Discharge: 2016-05-21 | Disposition: A | Discharge status: Home / Self Care | Payer: BLUE CROSS/BLUE SHIELD | Source: Ambulatory Visit | Attending: Radiation Oncology | Admitting: Radiation Oncology

## 2016-05-21 DIAGNOSIS — C50911 Malignant neoplasm of unspecified site of right female breast: Secondary | ICD-10-CM | POA: Diagnosis not present

## 2016-05-21 NOTE — Telephone Encounter (Signed)
Spoke with patient at this time in regards to her Chest X-ray results. Explained that her port is in wonderful position and that positioning is not the problem with the catheter not working properly.  Call has been placed to Tanya Nones at this time as Lelon Frohlich is out of the office but patient may need Clot buster at next blood draw / flushing if this is still a problem at that time. No answer. Left voicemail for return phone call.

## 2016-05-24 ENCOUNTER — Inpatient Hospital Stay: Payer: BLUE CROSS/BLUE SHIELD

## 2016-05-24 ENCOUNTER — Ambulatory Visit
Admission: RE | Admit: 2016-05-24 | Discharge: 2016-05-24 | Disposition: A | Discharge status: Home / Self Care | Payer: BLUE CROSS/BLUE SHIELD | Source: Ambulatory Visit | Attending: Radiation Oncology | Admitting: Radiation Oncology

## 2016-05-24 DIAGNOSIS — C50911 Malignant neoplasm of unspecified site of right female breast: Secondary | ICD-10-CM | POA: Diagnosis not present

## 2016-05-24 NOTE — Telephone Encounter (Signed)
This information was given to [facility] at this time. She will pass this along to the Chemo nurses for further investigation prior to patient's next flushing and lab appointment.

## 2016-05-25 ENCOUNTER — Telehealth: Payer: Self-pay | Admitting: Family Medicine

## 2016-05-25 ENCOUNTER — Ambulatory Visit (INDEPENDENT_AMBULATORY_CARE_PROVIDER_SITE_OTHER): Payer: BLUE CROSS/BLUE SHIELD | Admitting: Family Medicine

## 2016-05-25 ENCOUNTER — Ambulatory Visit
Admission: RE | Admit: 2016-05-25 | Discharge: 2016-05-25 | Disposition: A | Discharge status: Home / Self Care | Payer: BLUE CROSS/BLUE SHIELD | Source: Ambulatory Visit | Attending: Radiation Oncology | Admitting: Radiation Oncology

## 2016-05-25 ENCOUNTER — Encounter: Payer: Self-pay | Admitting: Family Medicine

## 2016-05-25 VITALS — BP 120/74 | HR 66 | Temp 98.3°F | Resp 16 | Wt 301.0 lb

## 2016-05-25 DIAGNOSIS — E039 Hypothyroidism, unspecified: Secondary | ICD-10-CM | POA: Diagnosis not present

## 2016-05-25 DIAGNOSIS — S161XXA Strain of muscle, fascia and tendon at neck level, initial encounter: Secondary | ICD-10-CM

## 2016-05-25 DIAGNOSIS — C50911 Malignant neoplasm of unspecified site of right female breast: Secondary | ICD-10-CM | POA: Diagnosis not present

## 2016-05-25 MED ORDER — MECLIZINE HCL 25 MG PO TABS: ORAL_TABLET | ORAL | Status: DC

## 2016-05-25 MED ORDER — CYCLOBENZAPRINE HCL 5 MG PO TABS: 5.0000 mg | ORAL_TABLET | Freq: Three times a day (TID) | ORAL | Status: DC | PRN

## 2016-05-25 NOTE — Progress Notes (Signed)
       Patient: Kaylee Reyes Female    DOB: 13-Jul-1951   65 y.o.   MRN: [id number] Visit Date: 05/25/2016  Today's Provider: Lelon Huh, MD   No chief complaint on file.  Subjective:    HPI  Patient has been having muscle spasms in her neck. Patient was taking cyclobenzaprine for the spasms. Patient is now out of the medication and wants to get a refill.     Allergies  Allergen Reactions  . Penicillins Shortness Of Breath  . Nickel Hives  . Pravastatin Sodium     Muscle pain, muscle cramps, muscle weakness   Current Meds  Medication Sig  . atenolol (TENORMIN) 50 MG tablet Take 1 tablet (50 mg total) by mouth daily.  Marland Kitchen docusate sodium (COLACE) 100 MG capsule Take 100 mg by mouth daily.  . fexofenadine (ALLEGRA) 180 MG tablet Take 180 mg by mouth daily.  . hydrochlorothiazide (HYDRODIURIL) 25 MG tablet Take 1 tablet by mouth daily.  Marland Kitchen ketoconazole (NIZORAL) 2 % cream Apply 1 application topically daily.  . lansoprazole (PREVACID) 15 MG capsule Take 15 mg by mouth daily at 12 noon.  Marland Kitchen levothyroxine (SYNTHROID, LEVOTHROID) 75 MCG tablet Take 1 tablet (75 mcg total) by mouth daily.  Marland Kitchen lidocaine-prilocaine (EMLA) cream Apply to affected area once  . magic mouthwash SOLN RINSE AND SPIT ONE TEASPOONFUL BY MOUTH EVERY 3 TO 4 HOURS AS NEEDED  . meclizine (ANTIVERT) 25 MG tablet TAKE ONE TABLET BY MOUTH EVERY 4 TO 6 HOURS AS NEEDED  . potassium chloride SA (K-DUR,KLOR-CON) 20 MEQ tablet TAKE 1 TABLET(20 MEQ) BY MOUTH DAILY  . pramoxine-hydrocortisone 1-1 % foam Apply topically 3 (three) times daily. For hemorrhoids    Review of Systems  Constitutional: Negative for fever, chills, appetite change and fatigue.  Respiratory: Negative for chest tightness and shortness of breath.   Cardiovascular: Negative for chest pain and palpitations.  Gastrointestinal: Negative for nausea, vomiting and abdominal pain.  Neurological: Negative for dizziness and weakness.    Social History    Substance Use Topics  . Smoking status: Never Smoker   . Smokeless tobacco: Never Used  . Alcohol Use: No   Objective:   BP 120/74 mmHg  Pulse 66  Temp(Src) 98.3 F (36.8 C) (Oral)  Resp 16  Wt 301 lb (136.533 kg)  SpO2 94%  Physical Exam  General appearance: alert, well developed, well nourished, cooperative and in no distress Head: Normocephalic, without obvious abnormality, atraumatic Respiratory: Respirations even and unlabored, normal respiratory rate Extremities: Mild tenderness along left superior trapezius.      Assessment & Plan:     1. Neck strain, initial encounter  - cyclobenzaprine (FLEXERIL) 5 MG tablet; Take 1 tablet (5 mg total) by mouth 3 (three) times daily as needed for muscle spasms.  Dispense: 30 tablet; Refill: 1  2. Hypothyroidism, unspecified hypothyroidism type  - TSH        Lelon Huh, MD  [facility]

## 2016-05-25 NOTE — Telephone Encounter (Signed)
Pt called to ask for a refill on cyclobenzaprine (FLEXERIL) 5 MG tablet but while I was sending message pt decided to come in for OV. Thanks TNP

## 2016-05-26 ENCOUNTER — Telehealth: Payer: Self-pay | Admitting: Family Medicine

## 2016-05-26 ENCOUNTER — Other Ambulatory Visit: Payer: Self-pay | Admitting: Family Medicine

## 2016-05-26 ENCOUNTER — Ambulatory Visit
Admission: RE | Admit: 2016-05-26 | Discharge: 2016-05-26 | Disposition: A | Discharge status: Home / Self Care | Payer: BLUE CROSS/BLUE SHIELD | Source: Ambulatory Visit | Attending: Radiation Oncology | Admitting: Radiation Oncology

## 2016-05-26 DIAGNOSIS — C50911 Malignant neoplasm of unspecified site of right female breast: Secondary | ICD-10-CM | POA: Diagnosis not present

## 2016-05-26 LAB — TSH: TSH: 2.02

## 2016-05-26 NOTE — Telephone Encounter (Signed)
Pt cxed request.  She found the written lab order

## 2016-05-27 ENCOUNTER — Ambulatory Visit
Admission: RE | Admit: 2016-05-27 | Discharge: 2016-05-27 | Disposition: A | Discharge status: Home / Self Care | Payer: BLUE CROSS/BLUE SHIELD | Source: Ambulatory Visit | Attending: Radiation Oncology | Admitting: Radiation Oncology

## 2016-05-27 DIAGNOSIS — C50911 Malignant neoplasm of unspecified site of right female breast: Secondary | ICD-10-CM | POA: Diagnosis not present

## 2016-05-28 ENCOUNTER — Ambulatory Visit
Admission: RE | Admit: 2016-05-28 | Discharge: 2016-05-28 | Disposition: A | Discharge status: Home / Self Care | Payer: BLUE CROSS/BLUE SHIELD | Source: Ambulatory Visit | Attending: Radiation Oncology | Admitting: Radiation Oncology

## 2016-05-28 ENCOUNTER — Ambulatory Visit: Payer: BLUE CROSS/BLUE SHIELD

## 2016-05-31 ENCOUNTER — Ambulatory Visit
Admission: RE | Admit: 2016-05-31 | Discharge: 2016-05-31 | Disposition: A | Discharge status: Home / Self Care | Payer: BLUE CROSS/BLUE SHIELD | Source: Ambulatory Visit | Attending: Radiation Oncology | Admitting: Radiation Oncology

## 2016-05-31 DIAGNOSIS — C50911 Malignant neoplasm of unspecified site of right female breast: Secondary | ICD-10-CM | POA: Diagnosis not present

## 2016-06-01 ENCOUNTER — Telehealth: Payer: Self-pay | Admitting: Family Medicine

## 2016-06-01 ENCOUNTER — Ambulatory Visit
Admission: RE | Admit: 2016-06-01 | Discharge: 2016-06-01 | Disposition: A | Discharge status: Home / Self Care | Payer: BLUE CROSS/BLUE SHIELD | Source: Ambulatory Visit | Attending: Radiation Oncology | Admitting: Radiation Oncology

## 2016-06-01 DIAGNOSIS — C50911 Malignant neoplasm of unspecified site of right female breast: Secondary | ICD-10-CM | POA: Diagnosis not present

## 2016-06-01 DIAGNOSIS — E039 Hypothyroidism, unspecified: Secondary | ICD-10-CM

## 2016-06-01 NOTE — Telephone Encounter (Signed)
Pt wants results from [facility] labs done on 05/25/16.

## 2016-06-01 NOTE — Telephone Encounter (Signed)
Called [facility], lab results are being faxed to office.

## 2016-06-02 ENCOUNTER — Ambulatory Visit
Admission: RE | Admit: 2016-06-02 | Discharge: 2016-06-02 | Disposition: A | Discharge status: Home / Self Care | Payer: BLUE CROSS/BLUE SHIELD | Source: Ambulatory Visit | Attending: Radiation Oncology | Admitting: Radiation Oncology

## 2016-06-02 DIAGNOSIS — C50911 Malignant neoplasm of unspecified site of right female breast: Secondary | ICD-10-CM | POA: Diagnosis not present

## 2016-06-03 ENCOUNTER — Ambulatory Visit
Admission: RE | Admit: 2016-06-03 | Discharge: 2016-06-03 | Disposition: A | Discharge status: Home / Self Care | Payer: BLUE CROSS/BLUE SHIELD | Source: Ambulatory Visit | Attending: Radiation Oncology | Admitting: Radiation Oncology

## 2016-06-03 ENCOUNTER — Inpatient Hospital Stay: Payer: BLUE CROSS/BLUE SHIELD

## 2016-06-03 DIAGNOSIS — C50911 Malignant neoplasm of unspecified site of right female breast: Secondary | ICD-10-CM | POA: Diagnosis not present

## 2016-06-03 LAB — CBC
HCT: 32.2 % — ABNORMAL LOW (ref 35.0–47.0)
HEMOGLOBIN: 10.9 g/dL — AB (ref 12.0–16.0)
MCH: 27.9 pg (ref 26.0–34.0)
MCHC: 33.9 g/dL (ref 32.0–36.0)
MCV: 82.4 fL (ref 80.0–100.0)
Platelets: 175 10*3/uL (ref 150–440)
RBC: 3.9 MIL/uL (ref 3.80–5.20)
RDW: 16 % — ABNORMAL HIGH (ref 11.5–14.5)
WBC: 6 10*3/uL (ref 3.6–11.0)

## 2016-06-04 ENCOUNTER — Ambulatory Visit: Payer: BLUE CROSS/BLUE SHIELD

## 2016-06-04 DIAGNOSIS — C50911 Malignant neoplasm of unspecified site of right female breast: Secondary | ICD-10-CM | POA: Diagnosis not present

## 2016-06-07 ENCOUNTER — Ambulatory Visit
Admission: RE | Admit: 2016-06-07 | Discharge: 2016-06-07 | Disposition: A | Discharge status: Home / Self Care | Payer: BLUE CROSS/BLUE SHIELD | Source: Ambulatory Visit | Attending: Radiation Oncology | Admitting: Radiation Oncology

## 2016-06-07 ENCOUNTER — Ambulatory Visit: Payer: BLUE CROSS/BLUE SHIELD

## 2016-06-07 DIAGNOSIS — C50911 Malignant neoplasm of unspecified site of right female breast: Secondary | ICD-10-CM | POA: Diagnosis not present

## 2016-06-09 ENCOUNTER — Ambulatory Visit
Admission: RE | Admit: 2016-06-09 | Discharge: 2016-06-09 | Disposition: A | Discharge status: Home / Self Care | Payer: BLUE CROSS/BLUE SHIELD | Source: Ambulatory Visit | Attending: Radiation Oncology | Admitting: Radiation Oncology

## 2016-06-09 ENCOUNTER — Ambulatory Visit: Payer: BLUE CROSS/BLUE SHIELD

## 2016-06-09 DIAGNOSIS — C50911 Malignant neoplasm of unspecified site of right female breast: Secondary | ICD-10-CM | POA: Diagnosis not present

## 2016-06-09 LAB — TSH: TSH: 2.02 u[IU]/mL (ref 0.450–4.500)

## 2016-06-10 ENCOUNTER — Ambulatory Visit
Admission: RE | Admit: 2016-06-10 | Discharge: 2016-06-10 | Disposition: A | Discharge status: Home / Self Care | Payer: BLUE CROSS/BLUE SHIELD | Source: Ambulatory Visit | Attending: Radiation Oncology | Admitting: Radiation Oncology

## 2016-06-10 ENCOUNTER — Ambulatory Visit: Payer: BLUE CROSS/BLUE SHIELD

## 2016-06-10 DIAGNOSIS — C50911 Malignant neoplasm of unspecified site of right female breast: Secondary | ICD-10-CM | POA: Diagnosis not present

## 2016-06-11 ENCOUNTER — Ambulatory Visit
Admission: RE | Admit: 2016-06-11 | Discharge: 2016-06-11 | Disposition: A | Discharge status: Home / Self Care | Payer: BLUE CROSS/BLUE SHIELD | Source: Ambulatory Visit | Attending: Radiation Oncology | Admitting: Radiation Oncology

## 2016-06-11 DIAGNOSIS — C50911 Malignant neoplasm of unspecified site of right female breast: Secondary | ICD-10-CM | POA: Diagnosis not present

## 2016-06-14 ENCOUNTER — Ambulatory Visit
Admission: RE | Admit: 2016-06-14 | Discharge: 2016-06-14 | Disposition: A | Discharge status: Home / Self Care | Payer: BLUE CROSS/BLUE SHIELD | Source: Ambulatory Visit | Attending: Radiation Oncology | Admitting: Radiation Oncology

## 2016-06-14 DIAGNOSIS — C50911 Malignant neoplasm of unspecified site of right female breast: Secondary | ICD-10-CM | POA: Diagnosis not present

## 2016-06-15 ENCOUNTER — Ambulatory Visit
Admission: RE | Admit: 2016-06-15 | Discharge: 2016-06-15 | Disposition: A | Discharge status: Home / Self Care | Payer: BLUE CROSS/BLUE SHIELD | Source: Ambulatory Visit | Attending: Radiation Oncology | Admitting: Radiation Oncology

## 2016-06-15 ENCOUNTER — Encounter: Payer: Self-pay | Admitting: Family Medicine

## 2016-06-15 DIAGNOSIS — C50911 Malignant neoplasm of unspecified site of right female breast: Secondary | ICD-10-CM | POA: Diagnosis not present

## 2016-06-16 ENCOUNTER — Ambulatory Visit
Admission: RE | Admit: 2016-06-16 | Discharge: 2016-06-16 | Disposition: A | Discharge status: Home / Self Care | Payer: BLUE CROSS/BLUE SHIELD | Source: Ambulatory Visit | Attending: Radiation Oncology | Admitting: Radiation Oncology

## 2016-06-16 DIAGNOSIS — C50911 Malignant neoplasm of unspecified site of right female breast: Secondary | ICD-10-CM | POA: Diagnosis not present

## 2016-06-17 ENCOUNTER — Ambulatory Visit: Payer: BLUE CROSS/BLUE SHIELD

## 2016-06-17 ENCOUNTER — Ambulatory Visit
Admission: RE | Admit: 2016-06-17 | Discharge: 2016-06-17 | Disposition: A | Discharge status: Home / Self Care | Payer: BLUE CROSS/BLUE SHIELD | Source: Ambulatory Visit | Attending: Radiation Oncology | Admitting: Radiation Oncology

## 2016-06-17 DIAGNOSIS — C50911 Malignant neoplasm of unspecified site of right female breast: Secondary | ICD-10-CM | POA: Diagnosis not present

## 2016-06-18 ENCOUNTER — Ambulatory Visit
Admission: RE | Admit: 2016-06-18 | Discharge: 2016-06-18 | Disposition: A | Discharge status: Home / Self Care | Payer: BLUE CROSS/BLUE SHIELD | Source: Ambulatory Visit | Attending: Radiation Oncology | Admitting: Radiation Oncology

## 2016-06-18 DIAGNOSIS — C50911 Malignant neoplasm of unspecified site of right female breast: Secondary | ICD-10-CM | POA: Diagnosis not present

## 2016-06-21 ENCOUNTER — Inpatient Hospital Stay: Payer: BLUE CROSS/BLUE SHIELD

## 2016-06-21 ENCOUNTER — Encounter: Payer: Self-pay | Admitting: *Deleted

## 2016-06-21 ENCOUNTER — Ambulatory Visit: Payer: BLUE CROSS/BLUE SHIELD

## 2016-06-21 ENCOUNTER — Inpatient Hospital Stay: Payer: BLUE CROSS/BLUE SHIELD | Attending: Oncology | Admitting: Oncology

## 2016-06-21 ENCOUNTER — Ambulatory Visit
Admission: RE | Admit: 2016-06-21 | Discharge: 2016-06-21 | Disposition: A | Discharge status: Home / Self Care | Payer: BLUE CROSS/BLUE SHIELD | Source: Ambulatory Visit | Attending: Radiation Oncology | Admitting: Radiation Oncology

## 2016-06-21 VITALS — BP 155/85 | HR 71 | Temp 96.8°F | Resp 18 | Wt 299.7 lb

## 2016-06-21 DIAGNOSIS — I10 Essential (primary) hypertension: Secondary | ICD-10-CM | POA: Insufficient documentation

## 2016-06-21 DIAGNOSIS — Z170 Estrogen receptor positive status [ER+]: Secondary | ICD-10-CM | POA: Insufficient documentation

## 2016-06-21 DIAGNOSIS — Z79811 Long term (current) use of aromatase inhibitors: Secondary | ICD-10-CM | POA: Diagnosis not present

## 2016-06-21 DIAGNOSIS — D649 Anemia, unspecified: Secondary | ICD-10-CM | POA: Insufficient documentation

## 2016-06-21 DIAGNOSIS — C50911 Malignant neoplasm of unspecified site of right female breast: Secondary | ICD-10-CM | POA: Diagnosis not present

## 2016-06-21 DIAGNOSIS — Z9221 Personal history of antineoplastic chemotherapy: Secondary | ICD-10-CM | POA: Diagnosis not present

## 2016-06-21 DIAGNOSIS — Z923 Personal history of irradiation: Secondary | ICD-10-CM | POA: Diagnosis not present

## 2016-06-21 DIAGNOSIS — Z79899 Other long term (current) drug therapy: Secondary | ICD-10-CM | POA: Insufficient documentation

## 2016-06-21 DIAGNOSIS — E785 Hyperlipidemia, unspecified: Secondary | ICD-10-CM | POA: Insufficient documentation

## 2016-06-21 DIAGNOSIS — K219 Gastro-esophageal reflux disease without esophagitis: Secondary | ICD-10-CM | POA: Diagnosis not present

## 2016-06-21 DIAGNOSIS — C50912 Malignant neoplasm of unspecified site of left female breast: Secondary | ICD-10-CM

## 2016-06-21 LAB — COMPREHENSIVE METABOLIC PANEL
ALT: 25 U/L (ref 14–54)
ANION GAP: 7 (ref 5–15)
AST: 29 U/L (ref 15–41)
Albumin: 4 g/dL (ref 3.5–5.0)
Alkaline Phosphatase: 81 U/L (ref 38–126)
BILIRUBIN TOTAL: 0.8 mg/dL (ref 0.3–1.2)
BUN: 11 mg/dL (ref 6–20)
CO2: 27 mmol/L (ref 22–32)
Calcium: 8.8 mg/dL — ABNORMAL LOW (ref 8.9–10.3)
Chloride: 101 mmol/L (ref 101–111)
Creatinine, Ser: 0.87 mg/dL (ref 0.44–1.00)
Glucose, Bld: 116 mg/dL — ABNORMAL HIGH (ref 65–99)
POTASSIUM: 3.7 mmol/L (ref 3.5–5.1)
Sodium: 135 mmol/L (ref 135–145)
TOTAL PROTEIN: 8.2 g/dL — AB (ref 6.5–8.1)

## 2016-06-21 LAB — CBC WITH DIFFERENTIAL/PLATELET
Basophils Absolute: 0 10*3/uL (ref 0–0.1)
Basophils Relative: 1 %
Eosinophils Absolute: 0.2 10*3/uL (ref 0–0.7)
Eosinophils Relative: 3 %
HEMATOCRIT: 32 % — AB (ref 35.0–47.0)
Hemoglobin: 10.9 g/dL — ABNORMAL LOW (ref 12.0–16.0)
LYMPHS PCT: 10 %
Lymphs Abs: 0.7 10*3/uL — ABNORMAL LOW (ref 1.0–3.6)
MCH: 27.7 pg (ref 26.0–34.0)
MCHC: 34.2 g/dL (ref 32.0–36.0)
MCV: 81 fL (ref 80.0–100.0)
MONO ABS: 0.3 10*3/uL (ref 0.2–0.9)
MONOS PCT: 4 %
NEUTROS ABS: 5.7 10*3/uL (ref 1.4–6.5)
Neutrophils Relative %: 82 %
Platelets: 174 10*3/uL (ref 150–440)
RBC: 3.95 MIL/uL (ref 3.80–5.20)
RDW: 16 % — AB (ref 11.5–14.5)
WBC: 6.8 10*3/uL (ref 3.6–11.0)

## 2016-06-21 MED ORDER — LETROZOLE 2.5 MG PO TABS: 2.5000 mg | ORAL_TABLET | Freq: Every day | ORAL | Status: DC

## 2016-06-21 MED ORDER — SODIUM CHLORIDE 0.9% FLUSH
10.0000 mL | INTRAVENOUS | Status: DC | PRN
  Administered 2016-06-21: 10 mL via INTRAVENOUS
  Filled 2016-06-21: qty 10

## 2016-06-21 MED ORDER — HEPARIN SOD (PORK) LOCK FLUSH 100 UNIT/ML IV SOLN
500.0000 [IU] | Freq: Once | INTRAVENOUS | Status: AC
  Administered 2016-06-21: 500 [IU] via INTRAVENOUS

## 2016-06-21 MED ORDER — HEPARIN SOD (PORK) LOCK FLUSH 100 UNIT/ML IV SOLN
INTRAVENOUS | Status: AC
  Filled 2016-06-21: qty 5

## 2016-06-21 NOTE — Progress Notes (Signed)
Feeling well today. Finished radiation treatments today. Here to discuss "breast cancer pill." questions if can stop taking potassium tablet.

## 2016-06-22 ENCOUNTER — Ambulatory Visit: Payer: BLUE CROSS/BLUE SHIELD

## 2016-06-27 NOTE — Progress Notes (Signed)
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  INTERVAL HISTORY: Patient returns to clinic today for further evaluation and initiation of an aromatase inhibitor. She has now completed her XRT. She currently feels well and is asymptomatic. She has no neurologic complaints. She denies any recent fevers or illnesses. She denies any pain. She has a good appetite and denies weight loss. She denies any chest pain. She has no nausea, vomiting, constipation, or diarrhea. She has no urinary complaints. Patient offers no specific complaints today.  REVIEW OF SYSTEMS:   Review of Systems  Constitutional: Negative for fever and malaise/fatigue.  Respiratory: Negative for cough and shortness of breath.   Cardiovascular: Negative.   Gastrointestinal: Negative.  Negative for abdominal pain.  Genitourinary: Negative.   Musculoskeletal: Negative.   Skin: Negative for rash.  Neurological: Negative for weakness.  Psychiatric/Behavioral: Negative.  The patient is not nervous/anxious.     As per HPI. Otherwise, a complete review of systems is negatve.  PAST MEDICAL HISTORY: Past Medical History:  Diagnosis Date  . Cancer [facility])    breast cancer  . GERD (gastroesophageal reflux disease)   . History of chicken pox   . Hyperlipidemia   . Hypertension   . Hypothyroidism   . Thyroid disease     PAST SURGICAL HISTORY: Past Surgical History:  Procedure Laterality Date  . ABDOMINAL HYSTERECTOMY  07/13/2013   Total. with BSO for postmenopausal bleeding and fibroids with large cervical polyp. Dr. Levy Sjogren and Dr. Glennon Mac at [facility] [place]  . ABDOMINAL ULTRASOUND  04/17/2011   Hepatomegaly with borderline slenomegaly. Suggestive of fatty liver. s/p cholecystectomy. Portions of aorta obscured  . BREAST SURGERY  2011   BREAST BIOPSY  . CESAREAN SECTION  1988  . CHOLECYSTECTOMY  1990  . EXPLORATORY LAPAROTOMY     Left breast  . MASTECTOMY W/ SENTINEL NODE BIOPSY Bilateral 10/28/2015   Procedure: MASTECTOMY WITH SENTINEL LYMPH NODE BIOPSY;  Surgeon: Hubbard Robinson, MD;  Location: [facility] ORS;  Service: General;  Laterality: Bilateral;  . PORTACATH PLACEMENT Left 12/23/2015   Procedure: INSERTION PORT-A-CATH;  Surgeon: Hubbard Robinson, MD;  Location: [facility] ORS;  Service: General;  Laterality: Left;  . WRIST SURGERY      FAMILY HISTORY Family History  Problem Relation Age of Onset  . Hypertension Mother   . Thyroid disease Mother   . Hypertension Brother   . Diabetes Brother        ADVANCED DIRECTIVES:    HEALTH MAINTENANCE: Social History  Substance Use Topics  . Smoking status: Never Smoker  . Smokeless tobacco: Never Used  . Alcohol use No     Colonoscopy:  PAP:  Bone density:  Lipid panel:  Allergies  Allergen Reactions  . Penicillins Shortness Of Breath  . Nickel Hives  . Pravastatin Sodium     Muscle pain, muscle cramps, muscle weakness    Current Outpatient Prescriptions  Medication Sig Dispense Refill  . atenolol (TENORMIN) 50 MG tablet Take 1 tablet (50 mg total) by mouth daily. 90 tablet 4  . cyclobenzaprine (FLEXERIL) 5 MG tablet Take 1 tablet (5 mg total) by mouth 3 (three) times daily as needed for muscle spasms. 30 tablet 1  . docusate sodium (  COLACE) 100 MG capsule Take 100 mg by mouth daily.    . fexofenadine (ALLEGRA) 180 MG tablet Take 180 mg by mouth daily.    . hydrochlorothiazide (HYDRODIURIL) 25 MG tablet Take 1 tablet by mouth daily.    Marland Kitchen ketoconazole (NIZORAL) 2 % cream Apply 1 application topically daily. 50 g 0  . lansoprazole (PREVACID) 15 MG capsule Take 15 mg by mouth daily at 12 noon.    Marland Kitchen levothyroxine  (SYNTHROID, LEVOTHROID) 75 MCG tablet Take 1 tablet (75 mcg total) by mouth daily. 30 tablet 5  . lidocaine-prilocaine (EMLA) cream Apply to affected area once 30 g 3  . magic mouthwash SOLN RINSE AND SPIT ONE TEASPOONFUL BY MOUTH EVERY 3 TO 4 HOURS AS NEEDED 100 mL 1  . meclizine (ANTIVERT) 25 MG tablet TAKE ONE TABLET BY MOUTH EVERY 4 TO 6 HOURS AS NEEDED FOR NAUSEA 30 tablet 3  . potassium chloride SA (K-DUR,KLOR-CON) 20 MEQ tablet TAKE 1 TABLET(20 MEQ) BY MOUTH DAILY 30 tablet 3  . pramoxine-hydrocortisone 1-1 % foam Apply topically 3 (three) times daily. For hemorrhoids    . letrozole (FEMARA) 2.5 MG tablet Take 1 tablet (2.5 mg total) by mouth daily. 30 tablet 6   No current facility-administered medications for this visit.    Facility-Administered Medications Ordered in Other Visits  Medication Dose Route Frequency Provider Last Rate Last Dose  . sodium chloride flush (NS) 0.9 % injection 10 mL  10 mL Intravenous PRN Lloyd Huger, MD   10 mL at 02/09/16 0930  . sodium chloride flush (NS) 0.9 % injection 10 mL  10 mL Intravenous PRN Lloyd Huger, MD   10 mL at 06/21/16 0950    OBJECTIVE: Vitals:   06/21/16 1023  BP: (!) 155/85  Pulse: 71  Resp: 18  Temp: (!) 96.8 F (36 C)     Body mass index is 46.94 kg/m.    ECOG FS:0 - Asymptomatic  General: Well-developed, well-nourished, no acute distress. Eyes: Pink conjunctiva, anicteric sclera. Breasts: Exam deferred today. Lungs: Clear to auscultation bilaterally. Heart: Regular rate and rhythm. No rubs, murmurs, or gallops. Abdomen: Soft, nontender, nondistended. No organomegaly noted, normoactive bowel sounds. Musculoskeletal: No edema, cyanosis, or clubbing. Neuro: Alert, answering all questions appropriately. Cranial nerves grossly intact. Skin: No rashes or petechiae noted. Psych: Normal affect.   LAB RESULTS:  Lab Results  Component Value Date   NA 135 06/21/2016   K 3.7 06/21/2016   CL 101 06/21/2016    CO2 27 06/21/2016   GLUCOSE 116 (H) 06/21/2016   BUN 11 06/21/2016   CREATININE 0.87 06/21/2016   CALCIUM 8.8 (L) 06/21/2016   PROT 8.2 (H) 06/21/2016   ALBUMIN 4.0 06/21/2016   AST 29 06/21/2016   ALT 25 06/21/2016   ALKPHOS 81 06/21/2016   BILITOT 0.8 06/21/2016   GFRNONAA >60 06/21/2016   GFRAA >60 06/21/2016    Lab Results  Component Value Date   WBC 6.8 06/21/2016   NEUTROABS 5.7 06/21/2016   HGB 10.9 (L) 06/21/2016   HCT 32.0 (L) 06/21/2016   MCV 81.0 06/21/2016   PLT 174 06/21/2016   Lab Results  Component Value Date   IRON 48 02/09/2016   TIBC 326 02/09/2016   IRONPCTSAT 15 02/09/2016     STUDIES: No results found.  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  PLAN:    1. Pathologic stage IIB ER/PR positive invasive lobular carcinoma of the right upper  breast: Patient is now status post mastectomy. Surgical pathology results as well as PET scan results reviewed independently confirming stage IIB disease. She completed 4 cycles of Taxotere and Cytoxan on March 03, 2016. She has now completed adjuvant XRT. Patient was offered the PALLAS clinical trial, but ultimately refused therefore she will take letrozole alone for total 5 years completing in July 2022.  We will get a baseline bone mineral density in the next 1-2 weeks. Patient will then return to clinic in 3 months for further evaluation.  2. Leukopenia: Resolved. Secondary to chemotherapy.  3. Anemia: Mild, monitor. 4. Hypokalemia:  Resolved. Patient has requested to discontinue her oral past potassium supplementation.   Patient expressed understanding and was in agreement with this plan. She also understands that She can call clinic at any time with any questions, concerns, or complaints.   Invasive lobular carcinoma of right breast, stage 2 ([place])   Staging form: Breast, AJCC 7th Edition     Pathologic stage: Stage IIB (T2, N1a, M0) - Signed by  Lloyd Huger, MD on 10/22/2015   Lloyd Huger, MD   06/27/2016 11:15 PM

## 2016-07-05 ENCOUNTER — Inpatient Hospital Stay: Payer: BLUE CROSS/BLUE SHIELD

## 2016-07-07 ENCOUNTER — Ambulatory Visit: Payer: BLUE CROSS/BLUE SHIELD

## 2016-07-13 ENCOUNTER — Other Ambulatory Visit: Payer: Self-pay | Admitting: Family Medicine

## 2016-07-28 ENCOUNTER — Encounter: Payer: Self-pay | Admitting: *Deleted

## 2016-07-29 ENCOUNTER — Ambulatory Visit
Admission: RE | Admit: 2016-07-29 | Discharge: 2016-07-29 | Disposition: A | Discharge status: Home / Self Care | Payer: BLUE CROSS/BLUE SHIELD | Source: Ambulatory Visit | Attending: Oncology | Admitting: Oncology

## 2016-07-29 DIAGNOSIS — C50919 Malignant neoplasm of unspecified site of unspecified female breast: Secondary | ICD-10-CM | POA: Diagnosis not present

## 2016-07-29 DIAGNOSIS — Z13820 Encounter for screening for osteoporosis: Secondary | ICD-10-CM | POA: Insufficient documentation

## 2016-07-29 DIAGNOSIS — C50911 Malignant neoplasm of unspecified site of right female breast: Secondary | ICD-10-CM

## 2016-08-04 ENCOUNTER — Inpatient Hospital Stay: Payer: BLUE CROSS/BLUE SHIELD | Attending: Oncology

## 2016-08-04 ENCOUNTER — Telehealth: Payer: Self-pay | Admitting: Family Medicine

## 2016-08-04 ENCOUNTER — Ambulatory Visit
Admission: RE | Admit: 2016-08-04 | Discharge: 2016-08-04 | Disposition: A | Discharge status: Home / Self Care | Payer: BLUE CROSS/BLUE SHIELD | Source: Ambulatory Visit | Attending: Radiation Oncology | Admitting: Radiation Oncology

## 2016-08-04 ENCOUNTER — Encounter: Payer: Self-pay | Admitting: Radiation Oncology

## 2016-08-04 VITALS — BP 169/85 | HR 73 | Temp 95.8°F | Resp 22 | Wt 300.5 lb

## 2016-08-04 DIAGNOSIS — Z9221 Personal history of antineoplastic chemotherapy: Secondary | ICD-10-CM | POA: Insufficient documentation

## 2016-08-04 DIAGNOSIS — Z923 Personal history of irradiation: Secondary | ICD-10-CM | POA: Diagnosis not present

## 2016-08-04 DIAGNOSIS — Z95828 Presence of other vascular implants and grafts: Secondary | ICD-10-CM

## 2016-08-04 DIAGNOSIS — Z9013 Acquired absence of bilateral breasts and nipples: Secondary | ICD-10-CM | POA: Diagnosis not present

## 2016-08-04 DIAGNOSIS — Z170 Estrogen receptor positive status [ER+]: Secondary | ICD-10-CM | POA: Diagnosis not present

## 2016-08-04 DIAGNOSIS — Z79811 Long term (current) use of aromatase inhibitors: Secondary | ICD-10-CM | POA: Diagnosis not present

## 2016-08-04 DIAGNOSIS — Z452 Encounter for adjustment and management of vascular access device: Secondary | ICD-10-CM | POA: Insufficient documentation

## 2016-08-04 DIAGNOSIS — C50911 Malignant neoplasm of unspecified site of right female breast: Secondary | ICD-10-CM | POA: Diagnosis present

## 2016-08-04 DIAGNOSIS — B372 Candidiasis of skin and nail: Secondary | ICD-10-CM

## 2016-08-04 MED ORDER — HEPARIN SOD (PORK) LOCK FLUSH 100 UNIT/ML IV SOLN
500.0000 [IU] | Freq: Once | INTRAVENOUS | Status: AC
  Administered 2016-08-04: 500 [IU] via INTRAVENOUS

## 2016-08-04 MED ORDER — SODIUM CHLORIDE 0.9% FLUSH
10.0000 mL | INTRAVENOUS | Status: DC | PRN
  Administered 2016-08-04: 10 mL via INTRAVENOUS
  Filled 2016-08-04: qty 10

## 2016-08-04 MED ORDER — KETOCONAZOLE 2 % EX CREA: 1.0000 "application " | TOPICAL_CREAM | Freq: Every day | CUTANEOUS | 3 refills | Status: DC

## 2016-08-04 NOTE — Telephone Encounter (Signed)
Pt called needing refill on her ketoconazole 2%   [employer] back [phone]  Thanks Con Memos

## 2016-08-04 NOTE — Progress Notes (Signed)
Radiation Oncology Follow up Note  Name: Kaylee Reyes   Date:   08/04/2016 MRN:  [id number] DOB: [birth year]    This 65 y.o. female presents to the clinic today for one-month follow-up status post right chest wall and peripheral lymphatic radiation for stage IIB invasive lobular carcinoma of the right breast.  REFERRING PROVIDER: Birdie Sons, MD  HPI: Patient is a 65 year old female now out 1 month having completed external beam radiation therapy to her right chest wall and peripheral lymphatics for a stage IIb (T2 N1 M0) ER/PR positive HER-2/neu negative invasive lobular carcinoma. She had bilateral mastectomies adjuvant chemotherapy and completed chest wall peripheral lymphatic radiation. She is seen today in routine follow-up and is doing well. She states she certainly tight around the chest although that is to be expected from her bilateral mastectomies. She specifically denies any swelling of her right upper extremity cough or bone pain.. She started letrozole which is causing some hot flashes and I suggested vitamin E supplements for that.  COMPLICATIONS OF TREATMENT: none  FOLLOW UP COMPLIANCE: keeps appointments   PHYSICAL EXAM:  BP (!) 169/85   Pulse 73   Temp (!) 95.8 F (35.4 C)   Resp (!) 22   Wt (!) 300 lb 7.8 oz (136.3 kg)   BMI 47.06 kg/m  Well-developed obese female in NAD. She is status post bilateral mastectomies. Chest wall the right is healing well with no evidence of mass or nodularity. No axillary or supraclavicular adenopathy is appreciated. No evidence of lymphedema and right or left upper extremity is noted. Well-developed well-nourished patient in NAD. HEENT reveals PERLA, EOMI, discs not visualized.  Oral cavity is clear. No oral mucosal lesions are identified. Neck is clear without evidence of cervical or supraclavicular adenopathy. Lungs are clear to A&P. Cardiac examination is essentially unremarkable with regular rate and rhythm without murmur rub or  thrill. Abdomen is benign with no organomegaly or masses noted. Motor sensory and DTR levels are equal and symmetric in the upper and lower extremities. Cranial nerves II through XII are grossly intact. Proprioception is intact. No peripheral adenopathy or edema is identified. No motor or sensory levels are noted. Crude visual fields are within normal range.  RADIOLOGY RESULTS: No current films for review  PLAN: Present time patient is doing well with no evidence of disease 1 month out from external beam radiation therapy. I am please were overall progress. I have asked to see her back in 4-5 months for follow-up. She continues close follow-up care with medical oncology. Patient knows to call with any concerns.  I would like to take this opportunity to thank you for allowing me to participate in the care of your patient.Armstead Peaks., MD

## 2016-09-03 ENCOUNTER — Other Ambulatory Visit: Payer: Self-pay | Admitting: *Deleted

## 2016-09-03 DIAGNOSIS — E876 Hypokalemia: Secondary | ICD-10-CM

## 2016-09-03 MED ORDER — POTASSIUM CHLORIDE CRYS ER 20 MEQ PO TBCR: EXTENDED_RELEASE_TABLET | ORAL | 0 refills | Status: DC

## 2016-09-03 NOTE — Telephone Encounter (Signed)
Do you want to refill her potassium tabs? Last potassium check was 06/21/16    Ref Range & Units 7mo ago 52mo ago   Sodium 135 - 145 mmol/L 135  137    Potassium 3.5 - 5.1 mmol/L 3.7  3.4

## 2016-09-19 NOTE — Progress Notes (Signed)
[place]  [phone] [phone]  ID: Kaylee Reyes OB: 28-Feb-1951  MR#: [id number]  [id number]  Patient Care Team: Birdie Sons, MD as PCP - General (Family Medicine) [name], MD as Consulting Physician (Gastroenterology) Hubbard Robinson, MD as Consulting Physician (Surgery)  CHIEF COMPLAINT: Pathologic stage IIB ER/PR positive invasive lobular carcinoma of the right upper breast.  INTERVAL HISTORY: Patient returns to clinic today for routine 3 month evaluation. She has some mild back pain and "stomach ache", but otherwise feels well.  She has no neurologic complaints. She denies any recent fevers or illnesses. She denies any other pain. She has a good appetite and denies weight loss. She denies any chest pain. She has no nausea, vomiting, constipation, or diarrhea. She has no urinary complaints. Patient offers no further specific complaints today.  REVIEW OF SYSTEMS:   Review of Systems  Constitutional: Negative for fever and malaise/fatigue.  Respiratory: Negative for cough and shortness of breath.   Cardiovascular: Negative.   Gastrointestinal: Negative.  Negative for abdominal pain.  Genitourinary: Negative.   Musculoskeletal: Positive for back pain.  Skin: Negative for rash.  Neurological: Negative.  Negative for sensory change and weakness.  Psychiatric/Behavioral: Negative.  The patient is not nervous/anxious.     As per HPI. Otherwise, a complete review of systems is negatve.  PAST MEDICAL HISTORY: Past Medical History:  Diagnosis Date  . Cancer [facility])    breast cancer  . GERD (gastroesophageal reflux disease)   . History of chicken pox   . Hyperlipidemia   . Hypertension   . Hypothyroidism   . Thyroid disease     PAST SURGICAL HISTORY: Past Surgical History:  Procedure Laterality Date  . ABDOMINAL HYSTERECTOMY  07/13/2013   Total. with BSO for postmenopausal bleeding and fibroids with large cervical  polyp. Dr. Levy Sjogren and Dr. Glennon Mac at [facility]  . ABDOMINAL ULTRASOUND  04/17/2011   Hepatomegaly with borderline slenomegaly. Suggestive of fatty liver. s/p cholecystectomy. Portions of aorta obscured  . BREAST SURGERY  2011   BREAST BIOPSY  . CESAREAN SECTION  1988  . CHOLECYSTECTOMY  1990  . EXPLORATORY LAPAROTOMY     Left breast  . MASTECTOMY W/ SENTINEL NODE BIOPSY Bilateral 10/28/2015   Procedure: MASTECTOMY WITH SENTINEL LYMPH NODE BIOPSY;  Surgeon: Hubbard Robinson, MD;  Location: [facility] ORS;  Service: General;  Laterality: Bilateral;  . PORTACATH PLACEMENT Left 12/23/2015   Procedure: INSERTION PORT-A-CATH;  Surgeon: Hubbard Robinson, MD;  Location: [facility] ORS;  Service: General;  Laterality: Left;  . WRIST SURGERY      FAMILY HISTORY Family History  Problem Relation Age of Onset  . Hypertension Mother   . Thyroid disease Mother   . Hypertension Brother   . Diabetes Brother        ADVANCED DIRECTIVES:    HEALTH MAINTENANCE: Social History  Substance Use Topics  . Smoking status: Never Smoker  . Smokeless tobacco: Never Used  . Alcohol use No     Colonoscopy:  PAP:  Bone density:  Lipid panel:  Allergies  Allergen Reactions  . Penicillins Shortness Of Breath  . Nickel Hives  . Pravastatin Sodium     Muscle pain, muscle cramps, muscle weakness    Current Outpatient Prescriptions  Medication Sig Dispense Refill  . atenolol (TENORMIN) 50 MG tablet Take 1 tablet (50 mg total) by mouth daily. 90 tablet 4  . cyclobenzaprine (FLEXERIL) 5 MG tablet Take 1 tablet (5 mg total) by mouth 3 (  three) times daily as needed for muscle spasms. 30 tablet 1  . docusate sodium (COLACE) 100 MG capsule Take 100 mg by mouth daily.    . fexofenadine (ALLEGRA) 180 MG tablet Take 180 mg by mouth daily.    . hydrochlorothiazide (HYDRODIURIL) 25 MG tablet Take 1 tablet by mouth daily.    Marland Kitchen ketoconazole (NIZORAL) 2 % cream Apply 1 application topically daily. 50 g 3  . lansoprazole  (PREVACID) 15 MG capsule Take 15 mg by mouth daily at 12 noon.    Marland Kitchen letrozole (FEMARA) 2.5 MG tablet Take 1 tablet (2.5 mg total) by mouth daily. 30 tablet 6  . levothyroxine (SYNTHROID, LEVOTHROID) 75 MCG tablet TAKE 1 TABLET(75 MCG) BY MOUTH DAILY 30 tablet 12  . lidocaine-prilocaine (EMLA) cream Apply to affected area once 30 g 3  . magic mouthwash SOLN RINSE AND SPIT ONE TEASPOONFUL BY MOUTH EVERY 3 TO 4 HOURS AS NEEDED 100 mL 1  . meclizine (ANTIVERT) 25 MG tablet TAKE ONE TABLET BY MOUTH EVERY 4 TO 6 HOURS AS NEEDED FOR NAUSEA 30 tablet 3  . potassium chloride SA (K-DUR,KLOR-CON) 20 MEQ tablet TAKE 1 TABLET(20 MEQ) BY MOUTH DAILY 30 tablet 0  . pramoxine-hydrocortisone 1-1 % foam Apply topically 3 (three) times daily. For hemorrhoids    . vitamin E 1000 UNIT capsule Take 1,000 Units by mouth daily.     No current facility-administered medications for this visit.    Facility-Administered Medications Ordered in Other Visits  Medication Dose Route Frequency Provider Last Rate Last Dose  . sodium chloride flush (NS) 0.9 % injection 10 mL  10 mL Intravenous PRN Lloyd Huger, MD   10 mL at 02/09/16 0930  . sodium chloride flush (NS) 0.9 % injection 10 mL  10 mL Intravenous PRN Lloyd Huger, MD   10 mL at 06/21/16 0950    OBJECTIVE: Vitals:   09/20/16 1016  BP: (!) 175/98  Pulse: 75  Resp: 18  Temp: 97.5 F (36.4 C)     Body mass index is 47.98 kg/m.    ECOG FS:0 - Asymptomatic  General: Well-developed, well-nourished, no acute distress. Eyes: Pink conjunctiva, anicteric sclera. Breasts: Exam deferred today. Lungs: Clear to auscultation bilaterally. Heart: Regular rate and rhythm. No rubs, murmurs, or gallops. Abdomen: Soft, nontender, nondistended. No organomegaly noted, normoactive bowel sounds. Musculoskeletal: No edema, cyanosis, or clubbing. Neuro: Alert, answering all questions appropriately. Cranial nerves grossly intact. Skin: No rashes or petechiae  noted. Psych: Normal affect.   LAB RESULTS:  Lab Results  Component Value Date   NA 139 09/20/2016   K 4.2 09/20/2016   CL 103 09/20/2016   CO2 31 09/20/2016   GLUCOSE 110 (H) 09/20/2016   BUN 11 09/20/2016   CREATININE 0.81 09/20/2016   CALCIUM 9.4 09/20/2016   PROT 8.2 (H) 06/21/2016   ALBUMIN 4.0 06/21/2016   AST 29 06/21/2016   ALT 25 06/21/2016   ALKPHOS 81 06/21/2016   BILITOT 0.8 06/21/2016   GFRNONAA >60 09/20/2016   GFRAA >60 09/20/2016    Lab Results  Component Value Date   WBC 7.8 09/20/2016   NEUTROABS 5.7 06/21/2016   HGB 10.9 (L) 09/20/2016   HCT 31.7 (L) 09/20/2016   MCV 85.0 09/20/2016   PLT 203 09/20/2016   Lab Results  Component Value Date   IRON 48 02/09/2016   TIBC 326 02/09/2016   IRONPCTSAT 15 02/09/2016     STUDIES: No results found.  ASSESSMENT: Pathologic stage IIB ER/PR positive  invasive lobular carcinoma of the right upper breast.  PLAN:    1. Pathologic stage IIB ER/PR positive invasive lobular carcinoma of the right upper breast: Patient is now status post mastectomy. Surgical pathology results as well as PET scan results reviewed independently confirming stage IIB disease. She completed 4 cycles of Taxotere and Cytoxan on March 03, 2016. She has now completed adjuvant XRT. Patient was offered the PALLAS clinical trial, but ultimately refused therefore she will take letrozole alone for total 5 years completing in July 2022.  Baseline bone mineral density on July 29, 2016 reported a T score of -0.8 which is considered normal. Repeat in August 2019. Patient will then return to clinic in 6 months for further evaluation.  2. Leukopenia: Resolved. Secondary to chemotherapy.  3. Anemia: Mild, monitor. 4. Hypokalemia:  Resolved. Patient has requested to discontinue her oral past potassium supplementation. 5. Back pain, "stomach ache": Likely unrelated to letrozole. Monitor.  Approximately 30 minutes was spent in discussion of which  greater than 50% was consultation.   Patient expressed understanding and was in agreement with this plan. She also understands that She can call clinic at any time with any questions, concerns, or complaints.   Invasive lobular carcinoma of right breast, stage 2 ([place])   Staging form: Breast, AJCC 7th Edition     Pathologic stage: Stage IIB (T2, N1a, M0) - Signed by Lloyd Huger, MD on 10/22/2015   Lloyd Huger, MD   09/20/2016 2:39 PM

## 2016-09-20 ENCOUNTER — Telehealth: Payer: Self-pay | Admitting: *Deleted

## 2016-09-20 ENCOUNTER — Inpatient Hospital Stay: Payer: BLUE CROSS/BLUE SHIELD

## 2016-09-20 ENCOUNTER — Inpatient Hospital Stay: Payer: BLUE CROSS/BLUE SHIELD | Attending: Oncology | Admitting: Oncology

## 2016-09-20 ENCOUNTER — Other Ambulatory Visit: Payer: Self-pay | Admitting: *Deleted

## 2016-09-20 VITALS — BP 175/98 | HR 75 | Temp 97.5°F | Resp 18 | Wt 306.3 lb

## 2016-09-20 DIAGNOSIS — I10 Essential (primary) hypertension: Secondary | ICD-10-CM | POA: Diagnosis not present

## 2016-09-20 DIAGNOSIS — Z9011 Acquired absence of right breast and nipple: Secondary | ICD-10-CM

## 2016-09-20 DIAGNOSIS — Z79899 Other long term (current) drug therapy: Secondary | ICD-10-CM

## 2016-09-20 DIAGNOSIS — E785 Hyperlipidemia, unspecified: Secondary | ICD-10-CM | POA: Insufficient documentation

## 2016-09-20 DIAGNOSIS — K219 Gastro-esophageal reflux disease without esophagitis: Secondary | ICD-10-CM | POA: Insufficient documentation

## 2016-09-20 DIAGNOSIS — Z170 Estrogen receptor positive status [ER+]: Secondary | ICD-10-CM | POA: Diagnosis not present

## 2016-09-20 DIAGNOSIS — D649 Anemia, unspecified: Secondary | ICD-10-CM | POA: Diagnosis not present

## 2016-09-20 DIAGNOSIS — E039 Hypothyroidism, unspecified: Secondary | ICD-10-CM | POA: Diagnosis not present

## 2016-09-20 DIAGNOSIS — C50911 Malignant neoplasm of unspecified site of right female breast: Secondary | ICD-10-CM

## 2016-09-20 DIAGNOSIS — Z452 Encounter for adjustment and management of vascular access device: Secondary | ICD-10-CM | POA: Diagnosis not present

## 2016-09-20 DIAGNOSIS — Z923 Personal history of irradiation: Secondary | ICD-10-CM | POA: Diagnosis not present

## 2016-09-20 DIAGNOSIS — Z9221 Personal history of antineoplastic chemotherapy: Secondary | ICD-10-CM

## 2016-09-20 LAB — BASIC METABOLIC PANEL
Anion gap: 5 (ref 5–15)
BUN: 11 mg/dL (ref 6–20)
CO2: 31 mmol/L (ref 22–32)
Calcium: 9.4 mg/dL (ref 8.9–10.3)
Chloride: 103 mmol/L (ref 101–111)
Creatinine, Ser: 0.81 mg/dL (ref 0.44–1.00)
GFR calc Af Amer: >60 mL/min (ref >60)
GLUCOSE: 110 mg/dL — AB (ref 65–99)
POTASSIUM: 4.2 mmol/L (ref 3.5–5.1)
Sodium: 139 mmol/L (ref 135–145)

## 2016-09-20 LAB — CBC
HEMATOCRIT: 31.7 % — AB (ref 35.0–47.0)
Hemoglobin: 10.9 g/dL — ABNORMAL LOW (ref 12.0–16.0)
MCH: 29.1 pg (ref 26.0–34.0)
MCHC: 34.3 g/dL (ref 32.0–36.0)
MCV: 85 fL (ref 80.0–100.0)
Platelets: 203 10*3/uL (ref 150–440)
RBC: 3.73 MIL/uL — ABNORMAL LOW (ref 3.80–5.20)
RDW: 15.4 % — AB (ref 11.5–14.5)
WBC: 7.8 10*3/uL (ref 3.6–11.0)

## 2016-09-20 MED ORDER — SODIUM CHLORIDE 0.9 % IJ SOLN
10.0000 mL | Freq: Once | INTRAMUSCULAR | Status: AC
  Administered 2016-09-20: 10 mL via INTRAVENOUS
  Filled 2016-09-20: qty 10

## 2016-09-20 MED ORDER — HEPARIN SOD (PORK) LOCK FLUSH 100 UNIT/ML IV SOLN
INTRAVENOUS | Status: AC
  Filled 2016-09-20: qty 5

## 2016-09-20 MED ORDER — HEPARIN SOD (PORK) LOCK FLUSH 100 UNIT/ML IV SOLN
500.0000 [IU] | Freq: Once | INTRAVENOUS | Status: AC
  Administered 2016-09-20: 500 [IU] via INTRAVENOUS

## 2016-09-20 MED ORDER — MAGIC MOUTHWASH: ORAL | 1 refills | Status: DC

## 2016-09-20 NOTE — Telephone Encounter (Signed)
Rx called in to pharmacy. 

## 2016-09-20 NOTE — Telephone Encounter (Signed)
Informed of lab results and is asking if she needs to continue taking Potassium since her K+ is now 4.2. Please advise

## 2016-09-20 NOTE — Progress Notes (Signed)
States has been having hot flashes but starting taking vit e and hot flashes have improved. Questions if could get PAC removed. Had breast exam at end of August by Dr. Baruch Gouty.

## 2016-09-20 NOTE — Telephone Encounter (Signed)
Informed per VO Dr Grayland Ormond ok to stop potassium tabs

## 2016-09-21 ENCOUNTER — Other Ambulatory Visit: Payer: Self-pay | Admitting: *Deleted

## 2016-09-24 ENCOUNTER — Telehealth: Payer: Self-pay | Admitting: *Deleted

## 2016-09-24 NOTE — Telephone Encounter (Signed)
That's fine. Thanks!

## 2016-09-24 NOTE — Telephone Encounter (Addendum)
Patient advised OK to drink her Green Tea

## 2016-09-24 NOTE — Telephone Encounter (Signed)
She has started drinking decaf Green Tea and it says to check with MD to be sure it does not interfere with her meds. Please advise

## 2016-09-27 ENCOUNTER — Ambulatory Visit (INDEPENDENT_AMBULATORY_CARE_PROVIDER_SITE_OTHER): Payer: BLUE CROSS/BLUE SHIELD | Admitting: Surgery

## 2016-09-27 ENCOUNTER — Other Ambulatory Visit: Payer: Self-pay

## 2016-09-27 ENCOUNTER — Encounter: Payer: Self-pay | Admitting: Surgery

## 2016-09-27 VITALS — BP 189/110 | HR 66 | Temp 98.3°F | Ht 67.0 in | Wt 295.4 lb

## 2016-09-27 DIAGNOSIS — C50911 Malignant neoplasm of unspecified site of right female breast: Secondary | ICD-10-CM

## 2016-09-27 NOTE — Progress Notes (Signed)
09/27/2016  Kaylee Reyes is an 65 y.o. female seen for the diagnosis of Invasive lobular carcinoma of right breast, stage 2 ([place]) [C50.911].  She has completed her chemotherapy and other treatments and is now on letrozole. Overall she is doing well with no acute complaints. she denies N/V, D/C, fevers or malaise.  She does endorse some abdominal pain/bloating that is intermittent and intermittent lower back pain but does not notice them together all the time.  Neither pain is constant.  She contributes the pains to the letrozole but is worried she has a recurrence of her cancer.  She also stopped taking her PPI around the same time as she started the letrozole.  She has had intermittent constipation and then diarrhea but states this is normal for her.    Past Medical History:  Diagnosis Date  . Cancer [facility])    breast cancer  . GERD (gastroesophageal reflux disease)   . History of chicken pox   . Hyperlipidemia   . Hypertension   . Hypothyroidism   . Thyroid disease     Past Surgical History:  Procedure Laterality Date  . ABDOMINAL HYSTERECTOMY  07/13/2013   Total. with BSO for postmenopausal bleeding and fibroids with large cervical polyp. Dr. Levy Sjogren and Dr. Glennon Mac at [facility]  . ABDOMINAL ULTRASOUND  04/17/2011   Hepatomegaly with borderline slenomegaly. Suggestive of fatty liver. s/p cholecystectomy. Portions of aorta obscured  . BREAST SURGERY  2011   BREAST BIOPSY  . CESAREAN SECTION  1988  . CHOLECYSTECTOMY  1990  . EXPLORATORY LAPAROTOMY     Left breast  . MASTECTOMY W/ SENTINEL NODE BIOPSY Bilateral 10/28/2015   Procedure: MASTECTOMY WITH SENTINEL LYMPH NODE BIOPSY;  Surgeon: Hubbard Robinson, MD;  Location: [facility] ORS;  Service: General;  Laterality: Bilateral;  . PORTACATH PLACEMENT Left 12/23/2015   Procedure: INSERTION PORT-A-CATH;  Surgeon: Hubbard Robinson, MD;  Location: [facility] ORS;  Service: General;  Laterality: Left;  . WRIST SURGERY      Family History  Problem  Relation Age of Onset  . Hypertension Mother   . Thyroid disease Mother   . Hypertension Brother   . Diabetes Brother     Social History:  reports that she has never smoked. She has never used smokeless tobacco. She reports that she does not drink alcohol or use drugs.  Allergies:  Allergies  Allergen Reactions  . Penicillins Shortness Of Breath  . Nickel Hives  . Pravastatin Sodium     Muscle pain, muscle cramps, muscle weakness    Medications reviewed.  Physical Exam:  BP (!) 189/110   Pulse 66   Temp 98.3 F (36.8 C) (Oral)   Ht 5\' 7"  (1.702 m)   Wt 295 lb 6.4 oz (134 kg)   BMI 46.27 kg/m   Gen: patient resting comfortably in clinic, no cardiovascular or respiratory distress Chest wall: bilateral mastectomy sites with some scarring after difficult healing, no new masses in chest wall or axilla Abd/GI: soft, obese, non tender  CBC Latest Ref Rng & Units 09/20/2016 06/21/2016 06/03/2016  WBC 3.6 - 11.0 K/uL 7.8 6.8 6.0  Hemoglobin 12.0 - 16.0 g/dL 10.9(L) 10.9(L) 10.9(L)  Hematocrit 35.0 - 47.0 % 31.7(L) 32.0(L) 32.2(L)  Platelets 150 - 440 K/uL 203 174 175   CMP Latest Ref Rng & Units 09/20/2016 06/21/2016 05/06/2016  Glucose 65 - 99 mg/dL 110(H) 116(H) 125(H)  BUN 6 - 20 mg/dL 11 11 13   Creatinine 0.44 - 1.00 mg/dL 0.81 0.87 0.80  Sodium 135 - 145 mmol/L 139 135 137  Potassium 3.5 - 5.1 mmol/L 4.2 3.7 3.4(L)  Chloride 101 - 111 mmol/L 103 101 102  CO2 22 - 32 mmol/L 31 27 28   Calcium 8.9 - 10.3 mg/dL 9.4 8.8(L) 9.0  Total Protein 6.5 - 8.1 g/dL - 8.2(H) 7.9  Total Bilirubin 0.3 - 1.2 mg/dL - 0.8 0.6  Alkaline Phos 38 - 126 U/L - 81 71  AST 15 - 41 U/L - 29 24  ALT 14 - 54 U/L - 25 20    Assessment/Plan: Kaylee Reyes is an 65 y.o. female seen for the diagnosis of Invasive lobular carcinoma of right breast, stage 2 ([place]) [C50.911].  She has completed the majority of her treatments, all chemotherapy and is now on the letrozole for 5 years.  Her symptoms of  abdominal pain are likely from stopping her PPI more so than the letrozole, attempted to convince her to resume PPI or to let me refer her to her GI, Dr. Allen Norris.  She would like to hold off on that at this time.  I do think her lower back pain is likely normal lower back pain from previous back injuries as her bone scan and Alkaline Phos is normal.  She does not have any evidence of recurrent disease.  Since she is done with her port we will remove it during the next two weeks.     I discussed the risk benefits alternatives with the patient to include a risk of bleeding, infection and damage to surrounding structures. The patient was given opportunity to ask questions and have them answered. I will book her for port placement in the upcoming weeks.      Zanita Millman L. Essam Lowdermilk MD General Surgeon  09/27/2016,12:36 PM

## 2016-09-27 NOTE — Patient Instructions (Addendum)
We will remove your port on 10/11/16. Please see (Blue) Pre-care sheet for further information.

## 2016-09-29 ENCOUNTER — Telehealth: Payer: Self-pay | Admitting: Surgery

## 2016-09-29 NOTE — Telephone Encounter (Signed)
Pt advised of pre op date/time and sx date. Sx: 10/11/16 with Dr Lina Sayre Removal.  Pre op: 10/04/16 between 9-1:00pm--Phone.   Patient made aware to call [phone], between 1-3:00pm the Friday before surgery, to find out what time to arrive.

## 2016-10-04 ENCOUNTER — Encounter
Admission: RE | Admit: 2016-10-04 | Discharge: 2016-10-04 | Disposition: A | Discharge status: Home / Self Care | Payer: BLUE CROSS/BLUE SHIELD | Source: Ambulatory Visit | Attending: Surgery | Admitting: Surgery

## 2016-10-04 DIAGNOSIS — I10 Essential (primary) hypertension: Secondary | ICD-10-CM | POA: Insufficient documentation

## 2016-10-04 HISTORY — DX: Other specified postprocedural states: R11.2

## 2016-10-04 HISTORY — DX: Other specified postprocedural states: Z98.890

## 2016-10-04 NOTE — Patient Instructions (Signed)
  Your procedure is scheduled on:10/11/16 Report to Day Surgery. [facility]OND FLOOR To find out your arrival time please call [phone] between 1PM - 3PM on 10/08/16  Remember: Instructions that are not followed completely may result in serious medical risk, up to and including death, or upon the discretion of your surgeon and anesthesiologist your surgery may need to be rescheduled.    _X___ 1. Do not eat food or drink liquids after midnight. No gum chewing or hard candies.   _X ____ 3. Do Not Smoke For 24 Hours Prior to Your Surgery.   ____ 4. Bring all medications with you on the day of surgery if instructed.    __X__ 5. Notify your doctor if there is any change in your medical condition     (cold, fever, infections).       Do not wear jewelry, make-up, hairpins, clips or nail polish.  Do not wear lotions, powders, or perfumes. You may wear deodorant.  Do not shave 48 hours prior to surgery. Men may shave face and neck.  Do not bring valuables to the hospital.    [facility] is not responsible for any belongings or valuables.               Contacts, dentures or bridgework may not be worn into surgery.  Leave your suitcase in the car. After surgery it may be brought to your room.  For patients admitted to the hospital, discharge time is determined by your                treatment team.   Patients discharged the day of surgery will not be allowed to drive home.      _X___ Take these medicines the morning of surgery with A SIP OF WATER:    1. ATENOLOL  2. LEVOTHYROXINE  3.   4.  5.  6.  ____ Fleet Enema (as directed)   _X___ Use CHG Soap as directed  ____ Use inhalers on the day of surgery  ____ Stop metformin 2 days prior to surgery    ____ Take 1/2 of usual insulin dose the night before surgery and none on the morning of surgery.   ____ Stop Coumadin/Plavix/aspirin on   _X___ Stop Anti-inflammatories on  STOP IBUPROFEN NOW   _X___ Stop supplements  until after surgery.   STOP SUPERB AND TUMERIC NOW ____ Bring C-Pap to the hospital.

## 2016-10-08 ENCOUNTER — Other Ambulatory Visit: Payer: Self-pay | Admitting: Family Medicine

## 2016-10-08 ENCOUNTER — Other Ambulatory Visit: Payer: Self-pay | Admitting: *Deleted

## 2016-10-08 MED ORDER — LETROZOLE 2.5 MG PO TABS: 2.5000 mg | ORAL_TABLET | Freq: Every day | ORAL | 1 refills | Status: DC

## 2016-10-10 MED ORDER — CIPROFLOXACIN IN D5W 400 MG/200ML IV SOLN
400.0000 mg | INTRAVENOUS | Status: AC
  Administered 2016-10-11: 400 mg via INTRAVENOUS

## 2016-10-11 ENCOUNTER — Encounter: Payer: Self-pay | Admitting: *Deleted

## 2016-10-11 ENCOUNTER — Encounter
Admission: RE | Disposition: A | Discharge status: Home / Self Care | Payer: Self-pay | Source: Ambulatory Visit | Attending: Surgery

## 2016-10-11 ENCOUNTER — Ambulatory Visit
Admission: RE | Admit: 2016-10-11 | Discharge: 2016-10-11 | Disposition: A | Discharge status: Home / Self Care | Payer: BLUE CROSS/BLUE SHIELD | Source: Ambulatory Visit | Attending: Surgery | Admitting: Surgery

## 2016-10-11 ENCOUNTER — Ambulatory Visit: Payer: BLUE CROSS/BLUE SHIELD | Admitting: Anesthesiology

## 2016-10-11 DIAGNOSIS — C50911 Malignant neoplasm of unspecified site of right female breast: Secondary | ICD-10-CM | POA: Diagnosis not present

## 2016-10-11 DIAGNOSIS — Z79899 Other long term (current) drug therapy: Secondary | ICD-10-CM | POA: Diagnosis not present

## 2016-10-11 DIAGNOSIS — Z9221 Personal history of antineoplastic chemotherapy: Secondary | ICD-10-CM | POA: Insufficient documentation

## 2016-10-11 DIAGNOSIS — I10 Essential (primary) hypertension: Secondary | ICD-10-CM | POA: Diagnosis not present

## 2016-10-11 DIAGNOSIS — Z79811 Long term (current) use of aromatase inhibitors: Secondary | ICD-10-CM | POA: Insufficient documentation

## 2016-10-11 DIAGNOSIS — Z170 Estrogen receptor positive status [ER+]: Secondary | ICD-10-CM

## 2016-10-11 DIAGNOSIS — K219 Gastro-esophageal reflux disease without esophagitis: Secondary | ICD-10-CM | POA: Diagnosis not present

## 2016-10-11 DIAGNOSIS — Z791 Long term (current) use of non-steroidal anti-inflammatories (NSAID): Secondary | ICD-10-CM | POA: Insufficient documentation

## 2016-10-11 DIAGNOSIS — Z6841 Body Mass Index (BMI) 40.0 and over, adult: Secondary | ICD-10-CM | POA: Insufficient documentation

## 2016-10-11 DIAGNOSIS — Z452 Encounter for adjustment and management of vascular access device: Secondary | ICD-10-CM | POA: Diagnosis not present

## 2016-10-11 DIAGNOSIS — E039 Hypothyroidism, unspecified: Secondary | ICD-10-CM | POA: Diagnosis not present

## 2016-10-11 DIAGNOSIS — E119 Type 2 diabetes mellitus without complications: Secondary | ICD-10-CM | POA: Diagnosis not present

## 2016-10-11 DIAGNOSIS — C50411 Malignant neoplasm of upper-outer quadrant of right female breast: Secondary | ICD-10-CM

## 2016-10-11 DIAGNOSIS — Z9013 Acquired absence of bilateral breasts and nipples: Secondary | ICD-10-CM | POA: Diagnosis not present

## 2016-10-11 HISTORY — PX: PORT-A-CATH REMOVAL: SHX5289

## 2016-10-11 SURGERY — REMOVAL PORT-A-CATH
Anesthesia: Monitor Anesthesia Care | Laterality: Left | Wound class: Clean

## 2016-10-11 MED ORDER — CIPROFLOXACIN IN D5W 400 MG/200ML IV SOLN
INTRAVENOUS | Status: AC
  Filled 2016-10-11: qty 200

## 2016-10-11 MED ORDER — FENTANYL CITRATE (PF) 100 MCG/2ML IJ SOLN: 25.0000 ug | INTRAMUSCULAR | Status: DC | PRN

## 2016-10-11 MED ORDER — FAMOTIDINE 20 MG PO TABS
20.0000 mg | ORAL_TABLET | Freq: Once | ORAL | Status: AC
  Administered 2016-10-11: 20 mg via ORAL

## 2016-10-11 MED ORDER — IBUPROFEN 600 MG PO TABS
600.0000 mg | ORAL_TABLET | Freq: Once | ORAL | Status: AC
  Administered 2016-10-11: 600 mg via ORAL

## 2016-10-11 MED ORDER — LACTATED RINGERS IV SOLN
INTRAVENOUS | Status: DC
  Administered 2016-10-11: 11:00:00 via INTRAVENOUS

## 2016-10-11 MED ORDER — PROPOFOL 500 MG/50ML IV EMUL
INTRAVENOUS | Status: DC | PRN
  Administered 2016-10-11: 100 ug/kg/min via INTRAVENOUS

## 2016-10-11 MED ORDER — BUPIVACAINE HCL 0.5 % IJ SOLN
INTRAMUSCULAR | Status: DC | PRN
  Administered 2016-10-11: 8 mL

## 2016-10-11 MED ORDER — FAMOTIDINE 20 MG PO TABS
ORAL_TABLET | ORAL | Status: AC
  Filled 2016-10-11: qty 1

## 2016-10-11 MED ORDER — PROPOFOL 10 MG/ML IV BOLUS
INTRAVENOUS | Status: DC | PRN
Start: 2016-10-11 — End: 2016-10-11
  Administered 2016-10-11: 50 mg via INTRAVENOUS

## 2016-10-11 MED ORDER — IBUPROFEN 600 MG PO TABS
ORAL_TABLET | ORAL | Status: AC
  Filled 2016-10-11: qty 1

## 2016-10-11 MED ORDER — CHLORHEXIDINE GLUCONATE CLOTH 2 % EX PADS: 6.0000 | MEDICATED_PAD | Freq: Once | CUTANEOUS | Status: DC

## 2016-10-11 MED ORDER — BUPIVACAINE HCL (PF) 0.5 % IJ SOLN
INTRAMUSCULAR | Status: AC
  Filled 2016-10-11: qty 30

## 2016-10-11 MED ORDER — ONDANSETRON HCL 4 MG/2ML IJ SOLN: 4.0000 mg | Freq: Once | INTRAMUSCULAR | Status: DC | PRN

## 2016-10-11 MED ORDER — MIDAZOLAM HCL 2 MG/2ML IJ SOLN
INTRAMUSCULAR | Status: DC | PRN
  Administered 2016-10-11: 2 mg via INTRAVENOUS

## 2016-10-11 SURGICAL SUPPLY — 20 items
ADH SKN CLS APL DERMABOND .7 (GAUZE/BANDAGES/DRESSINGS) ×1
CANISTER SUCT 1200ML W/VALVE (MISCELLANEOUS) ×3 IMPLANT
CHLORAPREP W/TINT 26ML (MISCELLANEOUS) ×3 IMPLANT
DERMABOND ADVANCED (GAUZE/BANDAGES/DRESSINGS) ×2
DERMABOND ADVANCED .7 DNX12 (GAUZE/BANDAGES/DRESSINGS) ×1 IMPLANT
ELECT REM PT RETURN 9FT ADLT (ELECTROSURGICAL) ×3
ELECTRODE REM PT RTRN 9FT ADLT (ELECTROSURGICAL) ×1 IMPLANT
GLOVE PI ORTHOPRO 6.5 (GLOVE) ×2
GLOVE PI ORTHOPRO STRL 6.5 (GLOVE) ×1 IMPLANT
GOWN STRL REUS W/ TWL LRG LVL3 (GOWN DISPOSABLE) ×2 IMPLANT
GOWN STRL REUS W/TWL LRG LVL3 (GOWN DISPOSABLE) ×6
KIT RM TURNOVER STRD PROC AR (KITS) ×3 IMPLANT
LABEL OR SOLS (LABEL) ×3 IMPLANT
LIQUID BAND (GAUZE/BANDAGES/DRESSINGS) ×2 IMPLANT
PACK PORT-A-CATH (MISCELLANEOUS) ×3 IMPLANT
SUT MNCRL 4-0 (SUTURE) ×3
SUT MNCRL 4-0 27XMFL (SUTURE) ×1
SUT VIC AB 3-0 SH 27 (SUTURE) ×3
SUT VIC AB 3-0 SH 27X BRD (SUTURE) ×1 IMPLANT
SUTURE MNCRL 4-0 27XMF (SUTURE) ×1 IMPLANT

## 2016-10-11 NOTE — Discharge Instructions (Signed)
AMBULATORY SURGERY  °DISCHARGE INSTRUCTIONS ° ° °1) The drugs that you were given will stay in your system until tomorrow so for the next 24 hours you should not: ° °A) Drive an automobile °B) Make any legal decisions °C) Drink any alcoholic beverage ° ° °2) You may resume regular meals tomorrow.  Today it is better to start with liquids and gradually work up to solid foods. ° °You may eat anything you prefer, but it is better to start with liquids, then soup and crackers, and gradually work up to solid foods. ° ° °3) Please notify your doctor immediately if you have any unusual bleeding, trouble breathing, redness and pain at the surgery site, drainage, fever, or pain not relieved by medication. ° ° ° °4) Additional Instructions: ° ° ° ° ° ° ° °Please contact your physician with any problems or Same Day Surgery at [phone], Monday through Friday 6 am to 4 pm, or [place] at [place] [facility] number at [phone]. °

## 2016-10-11 NOTE — Op Note (Signed)
PROCEDURE:  Port removal   Pre-operative Diagnosis:  Breast cancer, port in place  Post-operative Diagnosis: Breast cancer   Surgeon: Susa Griffins, MD  Anesthesia: IV sedation, marcaine .25% w epi and lidocaine 1%  Findings: Port removed without issues  Estimated Blood Loss: Minimal         Drains: None         Specimens: None       Complications: none         Procedure Details  The patient was seen again in the Holding Room. The benefits, complications, treatment options, and expected outcomes were discussed with the patient. The risks of bleeding, infection, recurrence of symptoms, failure to resolve symptoms,  thrombosis nonfunction breakage pneumothorax hemopneumothorax any of which could require chest tube or further surgery were reviewed with the patient.   The patient was taken to Operating Room, identified as Kaylee Reyes and the procedure verified.  A Time Out was held and the above information confirmed.  The patient was given anesthesia and tolerated well. The chest was prepped with Chloraprep and draped in the sterile fashion. The patient was positioned in the supine position.  Local anesthetic was infiltrated into the skin along the previous incision site.  An incision was made over the previous site with a 15 blade scalpel.  Port could be readily seen through incision.  The three fixing sutures were found and cut. THe port was taken through the site.  A figure of eight with 3-0 Vicryl was placed around the catheter and tied down as the catheter was fully removed.   The wound was closed with interrupted 3-0 Vicryl followed by 4-0 subcuticular Monocryl sutures. Dermabond used to coat the skin

## 2016-10-11 NOTE — Anesthesia Preprocedure Evaluation (Signed)
Anesthesia Evaluation  Patient identified by MRN, date of birth, ID band Patient awake    Reviewed: Allergy & Precautions, H&P , NPO status , Patient's Chart, lab work & pertinent test results, reviewed documented beta blocker date and time   History of Anesthesia Complications (+) PONV and history of anesthetic complications  Airway Mallampati: II  TM Distance: >3 FB Neck ROM: full    Dental no notable dental hx. (+) Teeth Intact   Pulmonary neg pulmonary ROS,    Pulmonary exam normal breath sounds clear to auscultation       Cardiovascular Exercise Tolerance: Good hypertension, negative cardio ROS   Rhythm:regular Rate:Normal     Neuro/Psych negative neurological ROS  negative psych ROS   GI/Hepatic negative GI ROS, Neg liver ROS, GERD  Medicated,  Endo/Other  negative endocrine ROSdiabetesHypothyroidism Morbid obesity  Renal/GU      Musculoskeletal   Abdominal   Peds  Hematology negative hematology ROS (+)   Anesthesia Other Findings   Reproductive/Obstetrics negative OB ROS                             Anesthesia Physical Anesthesia Plan  ASA: III  Anesthesia Plan: MAC   Post-op Pain Management:    Induction:   Airway Management Planned:   Additional Equipment:   Intra-op Plan:   Post-operative Plan:   Informed Consent: I have reviewed the patients History and Physical, chart, labs and discussed the procedure including the risks, benefits and alternatives for the proposed anesthesia with the patient or authorized representative who has indicated his/her understanding and acceptance.     Plan Discussed with: CRNA  Anesthesia Plan Comments:         Anesthesia Quick Evaluation

## 2016-10-11 NOTE — Interval H&P Note (Signed)
History and Physical Interval Note:  10/11/2016 1:07 PM  Kaylee Reyes  has presented today for surgery, with the diagnosis of Port catheter no longer in use  The various methods of treatment have been discussed with the patient and family. After consideration of risks, benefits and other options for treatment, the patient has consented to  Procedure(s): REMOVAL PORT-A-CATH (Left) as a surgical intervention .  The patient's history has been reviewed, patient examined, no change in status, stable for surgery.  I have reviewed the patient's chart and labs.  Questions were answered to the patient's satisfaction.    Gen: NAD Cardio: RRR, no murmur or gallop Res: CTAB/L No wheezing or rhonchi Abd: soft, nt,nd Ext: no edema   Catherine L Loflin

## 2016-10-11 NOTE — Transfer of Care (Signed)
Immediate Anesthesia Transfer of Care Note  Patient: Kaylee Reyes  Procedure(s) Performed: Procedure(s): REMOVAL PORT-A-CATH (Left)  Patient Location: PACU  Anesthesia Type:MAC  Level of Consciousness: awake, alert  and oriented  Airway & Oxygen Therapy: Patient Spontanous Breathing  Post-op Assessment: Report given to RN and Post -op Vital signs reviewed and stable  Post vital signs: Reviewed and stable  Last Vitals:  Vitals:   10/11/16 1057 10/11/16 1408  BP: (!) 119/55 136/69  Pulse: 69   Resp: 18 18  Temp: 36.7 C 36.1 C    Last Pain:  Vitals:   10/11/16 1057  TempSrc: Oral         Complications: No apparent anesthesia complications

## 2016-10-11 NOTE — H&P (View-Only) (Signed)
09/27/2016  Kaylee Reyes is an 65 y.o. female seen for the diagnosis of Invasive lobular carcinoma of right breast, stage 2 ([place]) [C50.911].  She has completed her chemotherapy and other treatments and is now on letrozole. Overall she is doing well with no acute complaints. she denies N/V, D/C, fevers or malaise.  She does endorse some abdominal pain/bloating that is intermittent and intermittent lower back pain but does not notice them together all the time.  Neither pain is constant.  She contributes the pains to the letrozole but is worried she has a recurrence of her cancer.  She also stopped taking her PPI around the same time as she started the letrozole.  She has had intermittent constipation and then diarrhea but states this is normal for her.    Past Medical History:  Diagnosis Date  . Cancer [facility])    breast cancer  . GERD (gastroesophageal reflux disease)   . History of chicken pox   . Hyperlipidemia   . Hypertension   . Hypothyroidism   . Thyroid disease     Past Surgical History:  Procedure Laterality Date  . ABDOMINAL HYSTERECTOMY  07/13/2013   Total. with BSO for postmenopausal bleeding and fibroids with large cervical polyp. Dr. Levy Sjogren and Dr. Glennon Mac at [facility]  . ABDOMINAL ULTRASOUND  04/17/2011   Hepatomegaly with borderline slenomegaly. Suggestive of fatty liver. s/p cholecystectomy. Portions of aorta obscured  . BREAST SURGERY  2011   BREAST BIOPSY  . CESAREAN SECTION  1988  . CHOLECYSTECTOMY  1990  . EXPLORATORY LAPAROTOMY     Left breast  . MASTECTOMY W/ SENTINEL NODE BIOPSY Bilateral 10/28/2015   Procedure: MASTECTOMY WITH SENTINEL LYMPH NODE BIOPSY;  Surgeon: Hubbard Robinson, MD;  Location: [facility] ORS;  Service: General;  Laterality: Bilateral;  . PORTACATH PLACEMENT Left 12/23/2015   Procedure: INSERTION PORT-A-CATH;  Surgeon: Hubbard Robinson, MD;  Location: [facility] ORS;  Service: General;  Laterality: Left;  . WRIST SURGERY      Family History  Problem  Relation Age of Onset  . Hypertension Mother   . Thyroid disease Mother   . Hypertension Brother   . Diabetes Brother     Social History:  reports that she has never smoked. She has never used smokeless tobacco. She reports that she does not drink alcohol or use drugs.  Allergies:  Allergies  Allergen Reactions  . Penicillins Shortness Of Breath  . Nickel Hives  . Pravastatin Sodium     Muscle pain, muscle cramps, muscle weakness    Medications reviewed.  Physical Exam:  BP (!) 189/110   Pulse 66   Temp 98.3 F (36.8 C) (Oral)   Ht 5\' 7"  (1.702 m)   Wt 295 lb 6.4 oz (134 kg)   BMI 46.27 kg/m   Gen: patient resting comfortably in clinic, no cardiovascular or respiratory distress Chest wall: bilateral mastectomy sites with some scarring after difficult healing, no new masses in chest wall or axilla Abd/GI: soft, obese, non tender  CBC Latest Ref Rng & Units 09/20/2016 06/21/2016 06/03/2016  WBC 3.6 - 11.0 K/uL 7.8 6.8 6.0  Hemoglobin 12.0 - 16.0 g/dL 10.9(L) 10.9(L) 10.9(L)  Hematocrit 35.0 - 47.0 % 31.7(L) 32.0(L) 32.2(L)  Platelets 150 - 440 K/uL 203 174 175   CMP Latest Ref Rng & Units 09/20/2016 06/21/2016 05/06/2016  Glucose 65 - 99 mg/dL 110(H) 116(H) 125(H)  BUN 6 - 20 mg/dL 11 11 13   Creatinine 0.44 - 1.00 mg/dL 0.81 0.87 0.80  Sodium 135 - 145 mmol/L 139 135 137  Potassium 3.5 - 5.1 mmol/L 4.2 3.7 3.4(L)  Chloride 101 - 111 mmol/L 103 101 102  CO2 22 - 32 mmol/L 31 27 28   Calcium 8.9 - 10.3 mg/dL 9.4 8.8(L) 9.0  Total Protein 6.5 - 8.1 g/dL - 8.2(H) 7.9  Total Bilirubin 0.3 - 1.2 mg/dL - 0.8 0.6  Alkaline Phos 38 - 126 U/L - 81 71  AST 15 - 41 U/L - 29 24  ALT 14 - 54 U/L - 25 20    Assessment/Plan: Kaylee Reyes is an 65 y.o. female seen for the diagnosis of Invasive lobular carcinoma of right breast, stage 2 ([place]) [C50.911].  She has completed the majority of her treatments, all chemotherapy and is now on the letrozole for 5 years.  Her symptoms of  abdominal pain are likely from stopping her PPI more so than the letrozole, attempted to convince her to resume PPI or to let me refer her to her GI, Dr. Allen Norris.  She would like to hold off on that at this time.  I do think her lower back pain is likely normal lower back pain from previous back injuries as her bone scan and Alkaline Phos is normal.  She does not have any evidence of recurrent disease.  Since she is done with her port we will remove it during the next two weeks.     I discussed the risk benefits alternatives with the patient to include a risk of bleeding, infection and damage to surrounding structures. The patient was given opportunity to ask questions and have them answered. I will book her for port placement in the upcoming weeks.      Haruka Kowaleski L. Anayla Giannetti MD General Surgeon  09/27/2016,12:36 PM

## 2016-10-12 ENCOUNTER — Encounter: Payer: Self-pay | Admitting: Surgery

## 2016-10-15 NOTE — Anesthesia Postprocedure Evaluation (Signed)
Anesthesia Post Note  Patient: Kaylee Reyes  Procedure(s) Performed: Procedure(s) (LRB): REMOVAL PORT-A-CATH (Left)  Patient location during evaluation: PACU Anesthesia Type: General Level of consciousness: awake and alert Pain management: pain level controlled Vital Signs Assessment: post-procedure vital signs reviewed and stable Respiratory status: spontaneous breathing, nonlabored ventilation, respiratory function stable and patient connected to nasal cannula oxygen Cardiovascular status: blood pressure returned to baseline and stable Postop Assessment: no signs of nausea or vomiting Anesthetic complications: no    Last Vitals:  Vitals:   10/11/16 1443 10/11/16 1513  BP: 137/69 138/65  Pulse: (!) 59   Resp: 16   Temp: 36.4 C     Last Pain:  Vitals:   10/12/16 0806  TempSrc:   PainSc: 0-No pain                 Molli Barrows

## 2016-10-21 ENCOUNTER — Ambulatory Visit (INDEPENDENT_AMBULATORY_CARE_PROVIDER_SITE_OTHER): Payer: BLUE CROSS/BLUE SHIELD | Admitting: Surgery

## 2016-10-21 ENCOUNTER — Encounter: Payer: Self-pay | Admitting: Surgery

## 2016-10-21 VITALS — BP 165/87 | HR 66 | Temp 98.3°F | Wt 292.0 lb

## 2016-10-21 DIAGNOSIS — C50911 Malignant neoplasm of unspecified site of right female breast: Secondary | ICD-10-CM

## 2016-10-21 MED ORDER — UNABLE TO FIND: 0 refills | Status: DC

## 2016-10-21 NOTE — Patient Instructions (Signed)
We will call you with your 6 month follow-up appointment with Dr. Azalee Course when it is time to schedule this.  Please call with any questions or concerns prior to this appointment.

## 2016-10-21 NOTE — Progress Notes (Signed)
65 year old female well-known to the surgery service who had bilateral mastectomies back in November [id number] complicated by superficial lateral lysis. The patient has completed her chemotherapy and have port removal last week. Patient states that there've been minimal pain in the area. She has had no fevers chills or drainage from the area. Patient states that has been itching and she scratched some of the glue off in her sleep.  Vitals:   10/21/16 1418  BP: (!) 165/87  Pulse: 66  Temp: 98.3 F (36.8 C)   PE: Gen: NAD Left chest port incision c/d/i with glue in place, some flaking, no erythema or drainage.    A/P:  Patient healing well from port removal. Instructed to place a dry dressing on at night to keep her from scratching the glue off. The glue should falloff in another week to 2 weeks. Patient is to follow-up in 6 months for her 1-1/2 year breast cancer follow-up.

## 2016-10-26 ENCOUNTER — Other Ambulatory Visit: Payer: Self-pay | Admitting: Family Medicine

## 2016-10-29 ENCOUNTER — Telehealth: Payer: Self-pay | Admitting: Family Medicine

## 2016-10-29 ENCOUNTER — Other Ambulatory Visit: Payer: Self-pay

## 2016-10-29 DIAGNOSIS — B372 Candidiasis of skin and nail: Secondary | ICD-10-CM

## 2016-10-29 MED ORDER — KETOCONAZOLE 2 % EX CREA: 1.0000 "application " | TOPICAL_CREAM | Freq: Every day | CUTANEOUS | 3 refills | Status: DC

## 2016-10-29 NOTE — Telephone Encounter (Signed)
Message sent to Dr. Caryn Section.

## 2016-10-29 NOTE — Telephone Encounter (Signed)
Pt needs a refill   ketoconazole (NIZORAL) 2 % cream  Taking 08/04/16 -- Birdie Sons, MD    Apply 1 application topically daily.   [facility], [place]

## 2016-10-29 NOTE — Telephone Encounter (Signed)
Patient requesting refill  Thanks

## 2016-11-17 ENCOUNTER — Other Ambulatory Visit: Payer: Self-pay | Admitting: Family Medicine

## 2016-11-17 DIAGNOSIS — S161XXA Strain of muscle, fascia and tendon at neck level, initial encounter: Secondary | ICD-10-CM

## 2016-11-17 NOTE — Telephone Encounter (Signed)
Pt contacted office for refill request on the following medications:  cyclobenzaprine (FLEXERIL) 5 MG tablet.  90 day supply.  [facility].  [phone]

## 2016-11-18 MED ORDER — CYCLOBENZAPRINE HCL 5 MG PO TABS: 5.0000 mg | ORAL_TABLET | Freq: Three times a day (TID) | ORAL | 1 refills | Status: DC | PRN

## 2016-11-23 ENCOUNTER — Ambulatory Visit (INDEPENDENT_AMBULATORY_CARE_PROVIDER_SITE_OTHER): Payer: BLUE CROSS/BLUE SHIELD | Admitting: Family Medicine

## 2016-11-23 ENCOUNTER — Encounter: Payer: Self-pay | Admitting: Family Medicine

## 2016-11-23 VITALS — BP 142/80 | Temp 98.5°F | Resp 16 | Wt 293.0 lb

## 2016-11-23 DIAGNOSIS — J329 Chronic sinusitis, unspecified: Secondary | ICD-10-CM | POA: Diagnosis not present

## 2016-11-23 DIAGNOSIS — J40 Bronchitis, not specified as acute or chronic: Secondary | ICD-10-CM | POA: Diagnosis not present

## 2016-11-23 MED ORDER — AZITHROMYCIN 250 MG PO TABS: ORAL_TABLET | ORAL | 0 refills | Status: DC

## 2016-11-23 NOTE — Progress Notes (Signed)
Patient: Kaylee Reyes Female    DOB: 1951-01-16   65 y.o.   MRN: [id number] Visit Date: 11/23/2016  Today's Provider: Lelon Huh, MD   Chief Complaint  Patient presents with  . Sinusitis    possibly    Subjective:    HPI Patient comes in today c/o cough and congestion for the last 1-2 weeks and getting worse.. Patient reports that she also has ear fullness, sore throat, and sinus pressure. She also mentions that she feels like the congestion has moved into her chest. Patient reports that she does take a daily allergy medication (Allegra), but it has not helped. Patient reports that she has been doing home remedies such as tea with honey, humidifiers, and cough drops.      Allergies  Allergen Reactions  . Penicillins Shortness Of Breath    Has patient had a PCN reaction causing immediate rash, facial/tongue/throat swelling, SOB or lightheadedness with hypotension: Yes Has patient had a PCN reaction causing severe rash involving mucus membranes or skin necrosis: No Has patient had a PCN reaction that required hospitalization No Has patient had a PCN reaction occurring within the last 10 years: No If all of the above answers are "NO", then may proceed with Cephalosporin use.   . Nickel Hives  . Pravastatin Sodium     Muscle pain, muscle cramps, muscle weakness     Current Outpatient Prescriptions:  .  atenolol (TENORMIN) 50 MG tablet, TAKE 1 TABLET BY MOUTH DAILY, Disp: 90 tablet, Rfl: 4 .  B Complex-C (SUPER B COMPLEX PO), Take 1 tablet by mouth daily., Disp: , Rfl:  .  Cholecalciferol (VITAMIN D3) 2000 units TABS, Take 2,000 Units by mouth daily., Disp: , Rfl:  .  cyclobenzaprine (FLEXERIL) 5 MG tablet, Take 1 tablet (5 mg total) by mouth 3 (three) times daily as needed for muscle spasms., Disp: 30 tablet, Rfl: 1 .  docusate sodium (COLACE) 100 MG capsule, Take 200 mg by mouth daily. , Disp: , Rfl:  .  fexofenadine (ALLEGRA) 180 MG tablet, Take 180 mg by mouth  daily as needed for allergies. , Disp: , Rfl:  .  hydrochlorothiazide (HYDRODIURIL) 25 MG tablet, TAKE 1 TABLET BY MOUTH EVERY DAY, Disp: 30 tablet, Rfl: 11 .  ibuprofen (ADVIL,MOTRIN) 200 MG tablet, Take 400 mg by mouth every 6 (six) hours as needed for mild pain., Disp: , Rfl:  .  ketoconazole (NIZORAL) 2 % cream, Apply 1 application topically daily., Disp: 50 g, Rfl: 3 .  letrozole (FEMARA) 2.5 MG tablet, Take 1 tablet (2.5 mg total) by mouth daily., Disp: 90 tablet, Rfl: 1 .  levothyroxine (SYNTHROID, LEVOTHROID) 75 MCG tablet, TAKE 1 TABLET(75 MCG) BY MOUTH DAILY, Disp: 30 tablet, Rfl: 12 .  magic mouthwash SOLN, RINSE AND SPIT ONE TEASPOONFUL BY MOUTH EVERY 3 TO 4 HOURS AS NEEDED (Patient taking differently: Take 5 mLs by mouth 2 (two) times daily as needed for mouth pain. RINSE AND SPIT ONE TEASPOONFUL BY MOUTH EVERY 3 TO 4 HOURS AS NEEDED), Disp: 100 mL, Rfl: 1 .  Magnesium 400 MG TABS, Take 400 mg by mouth daily., Disp: , Rfl:  .  meclizine (ANTIVERT) 25 MG tablet, TAKE ONE TABLET BY MOUTH EVERY 4 TO 6 HOURS AS NEEDED FOR NAUSEA, Disp: 30 tablet, Rfl: 3 .  Misc Natural Products (TURMERIC CURCUMIN) CAPS, Take 1 capsule by mouth daily., Disp: , Rfl:  .  UNABLE TO FIND, Breast Prosthesis [id number]), Disp: 2 each,  Rfl: 0 .  UNABLE TO FIND, Mastectomy Bra (L8000), Disp: 2 each, Rfl: 0 No current facility-administered medications for this visit.   Facility-Administered Medications Ordered in Other Visits:  .  sodium chloride flush (NS) 0.9 % injection 10 mL, 10 mL, Intravenous, PRN, Lloyd Huger, MD, 10 mL at 02/09/16 0930 .  sodium chloride flush (NS) 0.9 % injection 10 mL, 10 mL, Intravenous, PRN, Lloyd Huger, MD, 10 mL at 06/21/16 0950  Review of Systems  Constitutional: Positive for activity change and fatigue. Negative for fever.  HENT: Positive for congestion, ear pain, postnasal drip, rhinorrhea, sinus pain, sinus pressure and sore throat.   Respiratory: Positive for cough  and shortness of breath.   Cardiovascular: Negative.   Neurological: Positive for headaches.    Social History  Substance Use Topics  . Smoking status: Never Smoker  . Smokeless tobacco: Never Used  . Alcohol use No   Objective:   BP (!) 142/80 (BP Location: Right Wrist, Patient Position: Sitting, Cuff Size: Large)   Temp 98.5 F (36.9 C)   Resp 16   Wt 293 lb (132.9 kg)   BMI 47.29 kg/m   Physical Exam  General Appearance:    Alert, cooperative, no distress  HENT:   generalized TM normal without fluid or infection, neck without nodes, throat normal without erythema or exudate, maxillary sinuses tender and nasal mucosa pale and congested  Eyes:    PERRL, conjunctiva/corneas clear, EOM's intact       Lungs:     Clear to auscultation bilaterally, respirations unlabored  Heart:    Regular rate and rhythm  Neurologic:   Awake, alert, oriented x 3. No apparent focal neurological           defect.           Assessment & Plan:     1. Sinusitis, unspecified chronicity, unspecified location  - azithromycin (ZITHROMAX) 250 MG tablet; 2 by mouth today, then 1 daily for 4 days  Dispense: 6 tablet; Refill: 0  2. Bronchitis  - azithromycin (ZITHROMAX) 250 MG tablet; 2 by mouth today, then 1 daily for 4 days  Dispense: 6 tablet; Refill: 0  Call if symptoms change or if not rapidly improving.         The entirety of the information documented in the History of Present Illness, Review of Systems and Physical Exam were personally obtained by me. Portions of this information were initially documented by Wilburt Finlay, CMA and reviewed by me for thoroughness and accuracy.    Lelon Huh, MD  [facility]

## 2016-11-25 IMAGING — CR DG CHEST 1V
1 series · 1 of 1 positions shown · non-contrast
Comparison: 02/02/2016 .

CLINICAL DATA: Port-A-Cath.

EXAM:
CHEST 1 VIEW

[chest pa]
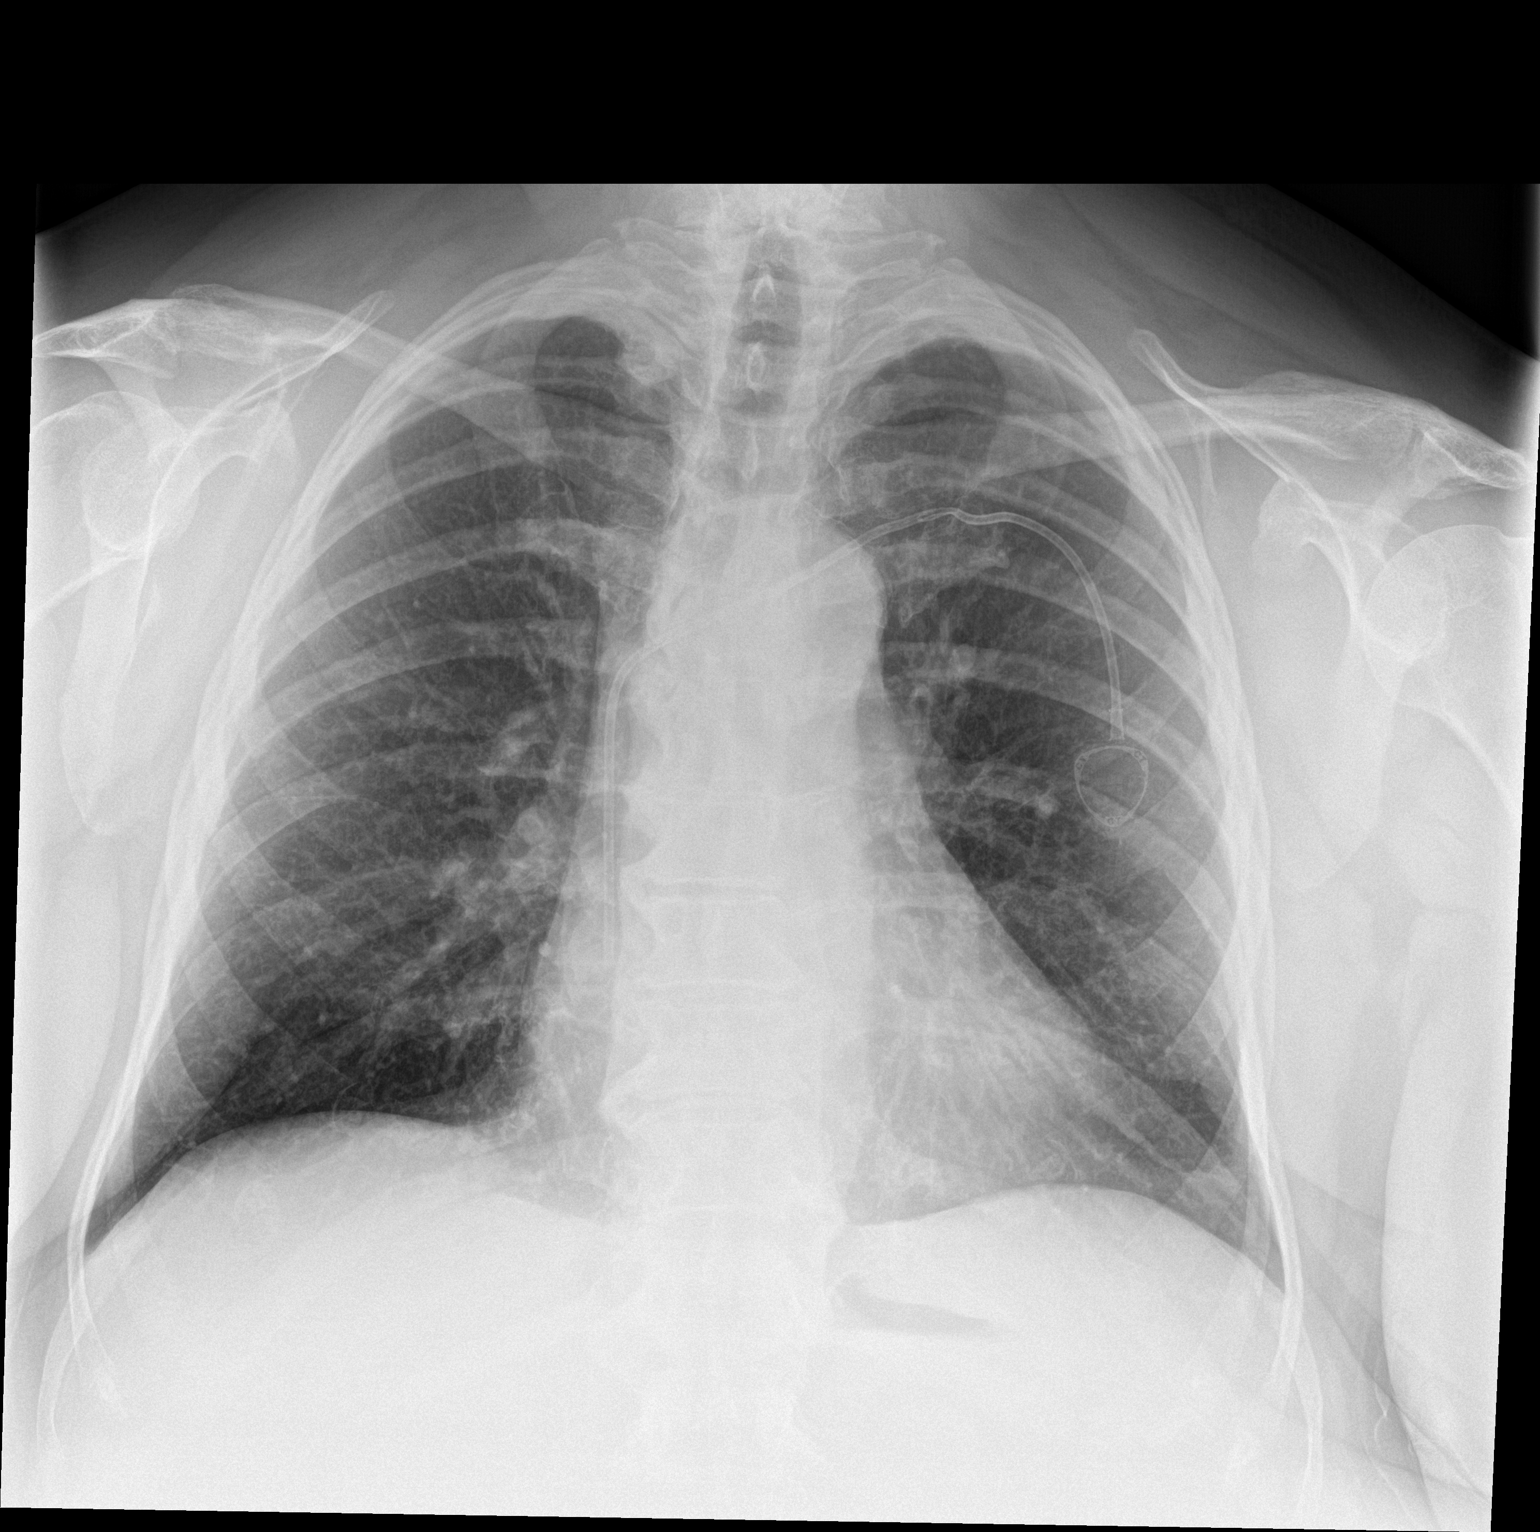

[1 of 1 positions shown; findings below may reference images not displayed]

FINDINGS: Prior port catheter noted with tip cavoatrial junction. Mediastinum
hilar structures normal. Mild cardiomegaly with normal pulmonary
vascularity. No focal infiltrate. No pleural effusion or
pneumothorax. No acute bony abnormality .
IMPRESSION: 1. PowerPort catheter in stable position with its tip at cavoatrial
junction.

2. No acute cardiopulmonary disease.

## 2016-12-15 ENCOUNTER — Telehealth: Payer: Self-pay | Admitting: *Deleted

## 2016-12-15 ENCOUNTER — Other Ambulatory Visit: Payer: Self-pay | Admitting: *Deleted

## 2016-12-15 ENCOUNTER — Encounter: Payer: Self-pay | Admitting: Radiation Oncology

## 2016-12-15 ENCOUNTER — Ambulatory Visit
Admission: RE | Admit: 2016-12-15 | Discharge: 2016-12-15 | Disposition: A | Discharge status: Home / Self Care | Payer: BLUE CROSS/BLUE SHIELD | Source: Ambulatory Visit | Attending: Radiation Oncology | Admitting: Radiation Oncology

## 2016-12-15 VITALS — BP 175/90 | HR 76 | Temp 97.3°F | Resp 20 | Wt 296.0 lb

## 2016-12-15 DIAGNOSIS — Z9013 Acquired absence of bilateral breasts and nipples: Secondary | ICD-10-CM | POA: Diagnosis not present

## 2016-12-15 DIAGNOSIS — Z170 Estrogen receptor positive status [ER+]: Secondary | ICD-10-CM | POA: Insufficient documentation

## 2016-12-15 DIAGNOSIS — C50911 Malignant neoplasm of unspecified site of right female breast: Secondary | ICD-10-CM

## 2016-12-15 DIAGNOSIS — M545 Low back pain: Secondary | ICD-10-CM | POA: Diagnosis not present

## 2016-12-15 DIAGNOSIS — Z79811 Long term (current) use of aromatase inhibitors: Secondary | ICD-10-CM | POA: Diagnosis not present

## 2016-12-15 DIAGNOSIS — Z923 Personal history of irradiation: Secondary | ICD-10-CM | POA: Insufficient documentation

## 2016-12-15 MED ORDER — CALCIUM CARBONATE 600 MG PO TABS: 600.0000 mg | ORAL_TABLET | Freq: Two times a day (BID) | ORAL | 5 refills | Status: DC

## 2016-12-15 NOTE — Progress Notes (Signed)
Radiation Oncology Follow up Note  Name: Kaylee Reyes   Date:   12/15/2016 MRN:  [id number] DOB: 19-Dec-1950    This 66 y.o. female presents to the clinic today for 5 month follow-up status post radiation therapy to her right chest wall peripheral lymphatics for stage IIb ER/PR positive invasive lobular carcinoma status post bilateral mastectomies.  REFERRING PROVIDER: Birdie Sons, MD  HPI: Patient is a 66 year old female now seen out 5 months having completed radiation therapy to her right chest wall and peripheral lymphatics for stage IIB (T2 N1 M0) ER/PR positive HER-2/neu negative invasive lobular carcinoma status post bilateral mastectomies. She is seen today in routine follow-up is doing well she states there some tightness across her chest most likely secondary to scarring and fibrosis secondary to her mastectomies. She's also having some lower back pain most likely arthritic in nature. She specifically denies cough or bone pain or any new chest wall nodularity or masses.. She is currently on letrozole tolerating that well without side effect.  COMPLICATIONS OF TREATMENT: none  FOLLOW UP COMPLIANCE: keeps appointments   PHYSICAL EXAM:  BP (!) 175/90   Pulse 76   Temp 97.3 F (36.3 C)   Resp 20   Wt 295 lb 15.5 oz (134.2 kg)   BMI 47.77 kg/m  Patient is status post bilateral mastectomies with very little sparing of subcutaneous tissue in the chest wall bilaterally. No evidence of mass or nodularity in either scars noted. No evidence of lymphedema of her upper extremities is noted. No axillary or supraclavicular adenopathy is identified. Well-developed well-nourished patient in NAD. HEENT reveals PERLA, EOMI, discs not visualized.  Oral cavity is clear. No oral mucosal lesions are identified. Neck is clear without evidence of cervical or supraclavicular adenopathy. Lungs are clear to A&P. Cardiac examination is essentially unremarkable with regular rate and rhythm without  murmur rub or thrill. Abdomen is benign with no organomegaly or masses noted. Motor sensory and DTR levels are equal and symmetric in the upper and lower extremities. Cranial nerves II through XII are grossly intact. Proprioception is intact. No peripheral adenopathy or edema is identified. No motor or sensory levels are noted. Crude visual fields are within normal range.  RADIOLOGY RESULTS: No current films for review  PLAN: Present time patient is doing well with no evidence of disease. Based on her complaints about difficulty with mobility and her upper chest second her to her bilateral mastectomies I reviewed I am referring her to physical therapy for evaluation. Otherwise I'm please were overall progress. I've asked to see her back in 6 months for follow-up. She continues on letrozole without side effect. Patient knows to call sooner with any concerns.  I would like to take this opportunity to thank you for allowing me to participate in the care of your patient.Armstead Peaks., MD

## 2016-12-15 NOTE — Telephone Encounter (Signed)
Pt questioned if needs to start taking calcium while on letrozole. Pt informed that it is recommended to take calcium supplement. Prescription for calcium sent to pharmacy. Pt instructed to continue vit d supplement daily.

## 2017-01-14 ENCOUNTER — Other Ambulatory Visit: Payer: Self-pay | Admitting: *Deleted

## 2017-01-15 MED ORDER — MAGIC MOUTHWASH: ORAL | 1 refills | Status: DC

## 2017-01-15 NOTE — Telephone Encounter (Signed)
Please call in Magic Mouthwash 

## 2017-01-17 NOTE — Telephone Encounter (Signed)
Rx called in to pharmacy. 

## 2017-01-21 ENCOUNTER — Other Ambulatory Visit: Payer: Self-pay

## 2017-01-21 ENCOUNTER — Ambulatory Visit (INDEPENDENT_AMBULATORY_CARE_PROVIDER_SITE_OTHER): Payer: BLUE CROSS/BLUE SHIELD | Admitting: Surgery

## 2017-01-21 ENCOUNTER — Telehealth: Payer: Self-pay

## 2017-01-21 ENCOUNTER — Encounter: Payer: Self-pay | Admitting: Surgery

## 2017-01-21 VITALS — BP 172/100 | HR 68 | Temp 98.2°F | Ht 66.0 in | Wt 294.4 lb

## 2017-01-21 DIAGNOSIS — B372 Candidiasis of skin and nail: Secondary | ICD-10-CM | POA: Diagnosis not present

## 2017-01-21 MED ORDER — NYSTATIN 100000 UNIT/GM EX POWD: Freq: Two times a day (BID) | CUTANEOUS | 0 refills | Status: DC

## 2017-01-21 NOTE — Progress Notes (Signed)
01/21/2017  History of Present Illness: Kaylee Reyes is a 66 y.o. female s/p bilateral mastectomy with sentinel node biopsy on 10/28/15 for right breast cancer with suspicious nodules on the left breast.  Her course was complicated by bilateral flap necrosis and infection.  She completed chemotherapy and radiation and had her port-a-cath removed on 10/11/16.    She called this morning reporting that last night she noted moisture over the left chest at the mastectomy site and had a foul smell to it.  Denies having any fevers or chills, chest pain or pain over the incisions, shortness of breath, nausea, or vomiting.  Past Medical History: Past Medical History:  Diagnosis Date  . Cancer [facility])    breast cancer  . GERD (gastroesophageal reflux disease)   . History of chicken pox   . Hyperlipidemia   . Hypertension   . Hypothyroidism   . PONV (postoperative nausea and vomiting)    exploratory surgery at [facility]  . Thyroid disease      Past Surgical History: Past Surgical History:  Procedure Laterality Date  . ABDOMINAL HYSTERECTOMY  07/13/2013   Total. with BSO for postmenopausal bleeding and fibroids with large cervical polyp. Dr. Levy Sjogren and Dr. Glennon Mac at [facility]  . ABDOMINAL ULTRASOUND  04/17/2011   Hepatomegaly with borderline slenomegaly. Suggestive of fatty liver. s/p cholecystectomy. Portions of aorta obscured  . BREAST SURGERY Right 2011   BREAST BIOPSY  . CESAREAN SECTION  1988  . CHOLECYSTECTOMY  1990  . EXPLORATORY LAPAROTOMY     Left breast  . MASTECTOMY W/ SENTINEL NODE BIOPSY Bilateral 10/28/2015   Procedure: MASTECTOMY WITH SENTINEL LYMPH NODE BIOPSY;  Surgeon: Hubbard Robinson, MD;  Location: [facility] ORS;  Service: General;  Laterality: Bilateral;  . PORT-A-CATH REMOVAL Left 10/11/2016   Procedure: REMOVAL PORT-A-CATH;  Surgeon: Hubbard Robinson, MD;  Location: [facility] ORS;  Service: General;  Laterality: Left;  . PORTACATH PLACEMENT Left 12/23/2015   Procedure: INSERTION  PORT-A-CATH;  Surgeon: Hubbard Robinson, MD;  Location: [facility] ORS;  Service: General;  Laterality: Left;  . WRIST SURGERY      Home Medications: Prior to Admission medications   Medication Sig Start Date End Date Taking? Authorizing Provider  atenolol (TENORMIN) 50 MG tablet TAKE 1 TABLET BY MOUTH DAILY 10/27/16   Birdie Sons, MD  B Complex-C (SUPER B COMPLEX PO) Take 1 tablet by mouth daily.    Historical Provider, MD  calcium carbonate (OS-CAL) 600 MG TABS tablet Take 1 tablet (600 mg total) by mouth 2 (two) times daily with a meal. 12/15/16   Lloyd Huger, MD  Cholecalciferol (VITAMIN D3) 2000 units TABS Take 2,000 Units by mouth daily.    Historical Provider, MD  cyclobenzaprine (FLEXERIL) 5 MG tablet Take 1 tablet (5 mg total) by mouth 3 (three) times daily as needed for muscle spasms. 11/18/16   Birdie Sons, MD  docusate sodium (COLACE) 100 MG capsule Take 200 mg by mouth daily.     Historical Provider, MD  fexofenadine (ALLEGRA) 180 MG tablet Take 180 mg by mouth daily as needed for allergies.     Historical Provider, MD  hydrochlorothiazide (HYDRODIURIL) 25 MG tablet TAKE 1 TABLET BY MOUTH EVERY DAY 10/09/16   Birdie Sons, MD  hydrocortisone (CORTEF) 20 MG tablet  01/17/17   Historical Provider, MD  ibuprofen (ADVIL,MOTRIN) 200 MG tablet Take 400 mg by mouth every 6 (six) hours as needed for mild pain.    Historical Provider, MD  ketoconazole (NIZORAL) 2 % cream Apply 1 application topically daily. 10/29/16   Birdie Sons, MD  letrozole [facility]) 2.5 MG tablet Take 1 tablet (2.5 mg total) by mouth daily. 10/08/16   Lloyd Huger, MD  levothyroxine (SYNTHROID, LEVOTHROID) 75 MCG tablet TAKE 1 TABLET(75 MCG) BY MOUTH DAILY 07/13/16   Birdie Sons, MD  magic mouthwash SOLN RINSE AND SPIT ONE TEASPOONFUL BY MOUTH EVERY 3 TO 4 HOURS AS NEEDED 01/15/17   Birdie Sons, MD  Magnesium 400 MG TABS Take 400 mg by mouth daily.    Historical Provider, MD  meclizine  (ANTIVERT) 25 MG tablet TAKE ONE TABLET BY MOUTH EVERY 4 TO 6 HOURS AS NEEDED FOR NAUSEA 05/25/16   Birdie Sons, MD  Misc Natural Products (TURMERIC CURCUMIN) CAPS Take 1 capsule by mouth daily.    Historical Provider, MD  UNABLE TO FIND Breast Prosthesis [phone]) 10/21/16   Hubbard Robinson, MD  UNABLE TO FIND Mastectomy Bra [phone]) 10/21/16   Hubbard Robinson, MD    Allergies: Allergies  Allergen Reactions  . Penicillins Shortness Of Breath    Has patient had a PCN reaction causing immediate rash, facial/tongue/throat swelling, SOB or lightheadedness with hypotension: Yes Has patient had a PCN reaction causing severe rash involving mucus membranes or skin necrosis: No Has patient had a PCN reaction that required hospitalization No Has patient had a PCN reaction occurring within the last 10 years: No If all of the above answers are "NO", then may proceed with Cephalosporin use.   . Nickel Hives  . Pravastatin Sodium     Muscle pain, muscle cramps, muscle weakness    Social History:  reports that she has never smoked. She has never used smokeless tobacco. She reports that she does not drink alcohol or use drugs.   Family History: Family History  Problem Relation Age of Onset  . Hypertension Mother   . Thyroid disease Mother   . Hypertension Brother   . Diabetes Brother     Review of Systems: Review of Systems  Constitutional: Negative for chills and fever.  HENT: Negative for hearing loss.   Eyes: Negative for blurred vision.  Respiratory: Negative for cough and shortness of breath.   Cardiovascular: Negative for chest pain and leg swelling.  Gastrointestinal: Negative for abdominal pain, heartburn, nausea and vomiting.  Genitourinary: Negative for dysuria and hematuria.  Musculoskeletal: Negative for myalgias.  Skin: Positive for rash (erythema over the lateral portion of left breast scar).  Neurological: Negative for dizziness.  Psychiatric/Behavioral: Negative  for depression.  All other systems reviewed and are negative.   Physical Exam BP (!) 172/100   Pulse 68   Temp 98.2 F (36.8 C) (Oral)   Ht 5\' 6"  (1.676 m)   Wt 133.5 kg (294 lb 6.4 oz)   BMI 47.52 kg/m  CONSTITUTIONAL: No acute distress. HEENT:  Normocephalic, atraumatic, extraocular motion intact. NECK: Trachea is midline, and there is no jugular venous distension.  RESPIRATORY:  Lungs are clear, and breath sounds are equal bilaterally. Normal respiratory effort without pathologic use of accessory muscles. CARDIOVASCULAR: Heart is regular without murmurs, gallops, or rubs. BREAST:  Right mastectomy site scar is clean and dry without erythema or evidence of infection.  No new wounds and healed well.  The left mastectomy site scar is clean, with an area of erythema measuring 3 cm x 2 cm over the lateral portion of the wound, between skin folds.  There is no drainage.  There  is a punctate wound 2 cm medial to the erythema which has no drainage and is too small to probe.  No fluctuance. GI: The abdomen is soft, nondistended, nontender.  MUSCULOSKELETAL:  Normal muscle strength and tone in all four extremities.  No peripheral edema or cyanosis. SKIN: Skin turgor is normal. There are no pathologic skin lesions.  NEUROLOGIC:  Motor and sensation is grossly normal.  Cranial nerves are grossly intact. PSYCH:  Alert and oriented to person, place and time. Affect is normal.  Labs/Imaging: None  Assessment and Plan: This is a 66 y.o. female s/p bilateral mastectomy and sentinel node biopsy, complicated by bilateral flap necrosis and infection.  She presents with erythema and foul smell over the lateral portion of the left mastectomy wound.  --This area of erythema being between skin folds likely represents a yeast infection.  There is no evidence of abscess or drainage.  --Will give Rx for Nystatin powder, to apply to area twice daily, cover with gauze and tape.  Currently no antibiotic is  needed. --Patient will follow up in one week to check her wound.  Patient is aware to call us if the erythema spreads, if there is purulent drainage, worsening pain, fevers, or chills.     Melvyn Neth, [place]

## 2017-01-21 NOTE — Patient Instructions (Signed)
Please pick up your medicine at the pharmacy. Please apply the powder to the affected area twice daily and cover with a bandage. Please see your follow up appointment listed below.

## 2017-01-21 NOTE — Telephone Encounter (Signed)
Kaylee Reyes had a double mastectomy back in November 2016 with Dr. Azalee Course. She only noticed the smell. She knows the smell because that's what she had before when she had leakage. Looks like little white splots. It's inside the flap/fold. She has redness around the site. No fever or chills, No nausea or vomiting, No inflammation was noticed. Please call patient and advice.

## 2017-01-21 NOTE — Telephone Encounter (Signed)
Spoke with patient at this time. She began smelling a foul odor and having white drainage from the most lateral side of the left mastectomy incision last night. Denies fever. Denies Nausea and vomiting.  Spoke with Dr. Hampton Abbot at this time. He would like to see patient in office today. Patient placed on schedule. Spoke with patient and she will be here today at scheduled time.

## 2017-01-28 ENCOUNTER — Ambulatory Visit: Payer: Self-pay | Admitting: Surgery

## 2017-03-01 ENCOUNTER — Encounter: Payer: Self-pay | Admitting: Certified Nurse Midwife

## 2017-03-02 ENCOUNTER — Telehealth: Payer: Self-pay | Admitting: Obstetrics and Gynecology

## 2017-03-09 ENCOUNTER — Encounter: Payer: BLUE CROSS/BLUE SHIELD | Admitting: Obstetrics and Gynecology

## 2017-03-21 ENCOUNTER — Inpatient Hospital Stay: Payer: BLUE CROSS/BLUE SHIELD | Attending: Oncology | Admitting: Oncology

## 2017-03-21 VITALS — BP 171/88 | HR 65 | Temp 97.3°F | Resp 18 | Wt 296.6 lb

## 2017-03-21 DIAGNOSIS — E785 Hyperlipidemia, unspecified: Secondary | ICD-10-CM | POA: Diagnosis not present

## 2017-03-21 DIAGNOSIS — Z923 Personal history of irradiation: Secondary | ICD-10-CM | POA: Diagnosis not present

## 2017-03-21 DIAGNOSIS — Z170 Estrogen receptor positive status [ER+]: Secondary | ICD-10-CM | POA: Insufficient documentation

## 2017-03-21 DIAGNOSIS — Z79899 Other long term (current) drug therapy: Secondary | ICD-10-CM | POA: Insufficient documentation

## 2017-03-21 DIAGNOSIS — K219 Gastro-esophageal reflux disease without esophagitis: Secondary | ICD-10-CM | POA: Diagnosis not present

## 2017-03-21 DIAGNOSIS — Z9221 Personal history of antineoplastic chemotherapy: Secondary | ICD-10-CM

## 2017-03-21 DIAGNOSIS — D649 Anemia, unspecified: Secondary | ICD-10-CM | POA: Diagnosis not present

## 2017-03-21 DIAGNOSIS — C50911 Malignant neoplasm of unspecified site of right female breast: Secondary | ICD-10-CM | POA: Insufficient documentation

## 2017-03-21 DIAGNOSIS — Z9013 Acquired absence of bilateral breasts and nipples: Secondary | ICD-10-CM | POA: Diagnosis not present

## 2017-03-21 DIAGNOSIS — I10 Essential (primary) hypertension: Secondary | ICD-10-CM | POA: Insufficient documentation

## 2017-03-21 DIAGNOSIS — E039 Hypothyroidism, unspecified: Secondary | ICD-10-CM | POA: Insufficient documentation

## 2017-03-21 NOTE — Progress Notes (Signed)
Complains of mild joint/back pain, hot flashes, and forgetfulness while taking letrozole. Pt is able to tolerate symptoms. 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

## 2017-03-21 NOTE — Progress Notes (Signed)
[place]  Telephone:([phone] Fax:([phone]) [phone]  ID: Jaquita Folds OB: 05/17/1951  MR#: [id number]  IPJ#:[id number]  Patient Care Team: Birdie Sons, MD as PCP - General (Family Medicine) [name], MD as Consulting Physician (Gastroenterology) Hubbard Robinson, MD as Consulting Physician (Surgery)  CHIEF COMPLAINT: Pathologic stage IIB ER/PR positive invasive lobular carcinoma of the right upper breast.  INTERVAL HISTORY: Patient returns to clinic today for routine 6 month evaluation. She currently feels well and is asymptomatic. She is tolerating letrozole with only mild joint pain and hot flashes which do not affect her day-to-day activity. She also complains of some "forgetfulness".  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 She has no other neurologic complaints. She denies any recent fevers or illnesses. She denies any other pain. She has a good appetite and denies weight loss. She denies any chest pain. She has no nausea, vomiting, constipation, or diarrhea. She has no urinary complaints. Patient offers no further specific complaints today.  REVIEW OF SYSTEMS:   Review of Systems  Constitutional: Negative for fever, malaise/fatigue and weight loss.  Respiratory: Negative for cough and shortness of breath.   Cardiovascular: Negative.  Negative for chest pain and leg swelling.  Gastrointestinal: Negative.  Negative for abdominal pain.  Genitourinary: Negative.   Musculoskeletal: Positive for back pain and joint pain.  Skin: Negative.  Negative for rash.  Neurological: Positive for sensory change. Negative for weakness.  Psychiatric/Behavioral: Positive for memory loss. The patient is not nervous/anxious.     As per HPI. Otherwise, a complete review of systems is negative.  PAST MEDICAL HISTORY: Past Medical History:  Diagnosis Date  . Cancer ([place]) 09/2015   breast cancer  . GERD (gastroesophageal reflux disease)   .  History of chicken pox   . Hyperlipidemia   . Hypertension   . Hypothyroidism   . PONV (postoperative nausea and vomiting)    exploratory surgery at [facility]  . Thyroid disease     PAST SURGICAL HISTORY: Past Surgical History:  Procedure Laterality Date  . ABDOMINAL HYSTERECTOMY  07/13/2013   Total. with BSO for postmenopausal bleeding and fibroids with large cervical polyp. Dr. Levy Sjogren and Dr. Glennon Mac at [facility]  . ABDOMINAL ULTRASOUND  04/17/2011   Hepatomegaly with borderline slenomegaly. Suggestive of fatty liver. s/p cholecystectomy. Portions of aorta obscured  . BREAST SURGERY Right 2011   BREAST BIOPSY  . CESAREAN SECTION  1988  . CHOLECYSTECTOMY  1990  . EXPLORATORY LAPAROTOMY     Left breast  . MASTECTOMY W/ SENTINEL NODE BIOPSY Bilateral 10/28/2015   Procedure: MASTECTOMY WITH SENTINEL LYMPH NODE BIOPSY;  Surgeon: Hubbard Robinson, MD;  Location: [facility] ORS;  Service: General;  Laterality: Bilateral;  . PORT-A-CATH REMOVAL Left 10/11/2016   Procedure: REMOVAL PORT-A-CATH;  Surgeon: Hubbard Robinson, MD;  Location: [facility] ORS;  Service: General;  Laterality: Left;  . PORTACATH PLACEMENT Left 12/23/2015   Procedure: INSERTION PORT-A-CATH;  Surgeon: Hubbard Robinson, MD;  Location: [facility] ORS;  Service: General;  Laterality: Left;  . WRIST SURGERY      FAMILY HISTORY Family History  Problem Relation Age of Onset  . Hypertension Mother   . Thyroid disease Mother   . Hypertension Brother   . Diabetes Brother   . Breast cancer Neg Hx   . Ovarian cancer Neg Hx   . Colon cancer Neg Hx        ADVANCED DIRECTIVES:    HEALTH MAINTENANCE: Social History  Substance  Use Topics  . Smoking status: Never Smoker  . Smokeless tobacco: Never Used  . Alcohol use No     Colonoscopy:  PAP:  Bone density:  Lipid panel:  Allergies  Allergen Reactions  . Penicillins Shortness Of Breath    Has patient had a PCN reaction causing immediate rash, facial/tongue/throat swelling, SOB or  lightheadedness with hypotension: Yes Has patient had a PCN reaction causing severe rash involving mucus membranes or skin necrosis: No Has patient had a PCN reaction that required hospitalization No Has patient had a PCN reaction occurring within the last 10 years: No If all of the above answers are "NO", then may proceed with Cephalosporin use.   . Nickel Hives  . Pravastatin Sodium     Muscle pain, muscle cramps, muscle weakness    Current Outpatient Prescriptions  Medication Sig Dispense Refill  . atenolol (TENORMIN) 50 MG tablet TAKE 1 TABLET BY MOUTH DAILY 90 tablet 4  . B Complex-C (SUPER B COMPLEX PO) Take 1 tablet by mouth daily.    . calcium carbonate (OS-CAL) 600 MG TABS tablet Take 1 tablet (600 mg total) by mouth 2 (two) times daily with a meal. 60 tablet 5  . Cholecalciferol (VITAMIN D3) 2000 units TABS Take 2,000 Units by mouth daily.    . cyclobenzaprine (FLEXERIL) 5 MG tablet Take 1 tablet (5 mg total) by mouth 3 (three) times daily as needed for muscle spasms. 30 tablet 1  . docusate sodium (COLACE) 100 MG capsule Take 200 mg by mouth daily.     . fexofenadine (ALLEGRA) 180 MG tablet Take 180 mg by mouth daily as needed for allergies.     . hydrochlorothiazide (HYDRODIURIL) 25 MG tablet TAKE 1 TABLET BY MOUTH EVERY DAY 30 tablet 11  . hydrocortisone (CORTEF) 20 MG tablet   0  . ibuprofen (ADVIL,MOTRIN) 200 MG tablet Take 400 mg by mouth every 6 (six) hours as needed for mild pain.    Marland Kitchen ketoconazole (NIZORAL) 2 % cream Apply 1 application topically daily. 50 g 3  . letrozole (FEMARA) 2.5 MG tablet Take 1 tablet (2.5 mg total) by mouth daily. 90 tablet 1  . levothyroxine (SYNTHROID, LEVOTHROID) 75 MCG tablet TAKE 1 TABLET(75 MCG) BY MOUTH DAILY 30 tablet 12  . magic mouthwash SOLN RINSE AND SPIT ONE TEASPOONFUL BY MOUTH EVERY 3 TO 4 HOURS AS NEEDED 100 mL 1  . Magnesium 400 MG TABS Take 400 mg by mouth daily.    . meclizine (ANTIVERT) 25 MG tablet TAKE ONE TABLET BY MOUTH  EVERY 4 TO 6 HOURS AS NEEDED FOR NAUSEA 30 tablet 3  . Misc Natural Products (TURMERIC CURCUMIN) CAPS Take 1 capsule by mouth daily.    Marland Kitchen nystatin (MYCOSTATIN/NYSTOP) powder Apply topically 2 (two) times daily. Apply to affected area for one week twice daily. 15 g 0   No current facility-administered medications for this visit.    Facility-Administered Medications Ordered in Other Visits  Medication Dose Route Frequency Provider Last Rate Last Dose  . sodium chloride flush (NS) 0.9 % injection 10 mL  10 mL Intravenous PRN Lloyd Huger, MD   10 mL at 02/09/16 0930  . sodium chloride flush (NS) 0.9 % injection 10 mL  10 mL Intravenous PRN Lloyd Huger, MD   10 mL at 06/21/16 0950    OBJECTIVE: Vitals:   03/21/17 1021  BP: (!) 171/88  Pulse: 65  Resp: 18  Temp: 97.3 F (36.3 C)     Body  mass index is 47.87 kg/m.    ECOG FS:0 - Asymptomatic  General: Well-developed, well-nourished, no acute distress. Eyes: Pink conjunctiva, anicteric sclera. Breasts: Patient requested exam be deferred today. Lungs: Clear to auscultation bilaterally. Heart: Regular rate and rhythm. No rubs, murmurs, or gallops. Abdomen: Soft, nontender, nondistended. No organomegaly noted, normoactive bowel sounds. Musculoskeletal: No edema, cyanosis, or clubbing. Neuro: Alert, answering all questions appropriately. Cranial nerves grossly intact. Skin: No rashes or petechiae noted. Psych: Normal affect.   LAB RESULTS:  Lab Results  Component Value Date   NA 139 09/20/2016   K 4.2 09/20/2016   CL 103 09/20/2016   CO2 31 09/20/2016   GLUCOSE 110 (H) 09/20/2016   BUN 11 09/20/2016   CREATININE 0.81 09/20/2016   CALCIUM 9.4 09/20/2016   PROT 8.2 (H) 06/21/2016   ALBUMIN 4.0 06/21/2016   AST 29 06/21/2016   ALT 25 06/21/2016   ALKPHOS 81 06/21/2016   BILITOT 0.8 06/21/2016   GFRNONAA >60 09/20/2016   GFRAA >60 09/20/2016    Lab Results  Component Value Date   WBC 7.8 09/20/2016    NEUTROABS 5.7 06/21/2016   HGB 10.9 (L) 09/20/2016   HCT 31.7 (L) 09/20/2016   MCV 85.0 09/20/2016   PLT 203 09/20/2016   Lab Results  Component Value Date   IRON 48 02/09/2016   TIBC 326 02/09/2016   IRONPCTSAT 15 02/09/2016     STUDIES: No results found.  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  PLAN:    1. Pathologic stage IIB ER/PR positive invasive lobular carcinoma of the right upper breast: Patient is now status post bilateral mastectomy, therefore does not require additional mammograms. She completed 4 cycles of Taxotere and Cytoxan on March 03, 2016. She has now completed adjuvant XRT. Patient was offered the PALLAS clinical trial, but ultimately refused therefore she will take letrozole alone for total 5 years completing in July 2022.  Baseline bone mineral density on July 29, 2016 reported a T score of -0.8 which is considered normal. Repeat in August 2019. Return to clinic in 6 months for further evaluation.  2. Leukopenia: Resolved. Secondary to chemotherapy.  3. Anemia: Mild, monitor. 4. Hot flashes: Secondary to letrozole. Continue treatment as prescribed.  Approximately 30 minutes was spent in discussion of which greater than 50% was consultation.   Patient expressed understanding and was in agreement with this plan. She also understands that She can call clinic at any time with any questions, concerns, or complaints.   Invasive lobular carcinoma of right breast, stage 2 ([place])   Staging form: Breast, AJCC 7th Edition     Pathologic stage: Stage IIB (T2, N1a, M0) - Signed by Lloyd Huger, MD on 10/22/2015   Lloyd Huger, MD   03/27/2017 7:32 AM

## 2017-03-23 ENCOUNTER — Encounter: Payer: Self-pay | Admitting: Obstetrics and Gynecology

## 2017-03-23 ENCOUNTER — Ambulatory Visit (INDEPENDENT_AMBULATORY_CARE_PROVIDER_SITE_OTHER): Payer: BLUE CROSS/BLUE SHIELD | Admitting: Obstetrics and Gynecology

## 2017-03-23 VITALS — BP 161/80 | HR 72 | Ht 66.0 in | Wt 295.7 lb

## 2017-03-23 DIAGNOSIS — N952 Postmenopausal atrophic vaginitis: Secondary | ICD-10-CM

## 2017-03-23 DIAGNOSIS — C50911 Malignant neoplasm of unspecified site of right female breast: Secondary | ICD-10-CM | POA: Diagnosis not present

## 2017-03-23 DIAGNOSIS — Z780 Asymptomatic menopausal state: Secondary | ICD-10-CM | POA: Diagnosis not present

## 2017-03-23 DIAGNOSIS — N816 Rectocele: Secondary | ICD-10-CM | POA: Diagnosis not present

## 2017-03-23 DIAGNOSIS — Z170 Estrogen receptor positive status [ER+]: Secondary | ICD-10-CM

## 2017-03-23 DIAGNOSIS — N3946 Mixed incontinence: Secondary | ICD-10-CM

## 2017-03-23 DIAGNOSIS — N8111 Cystocele, midline: Secondary | ICD-10-CM

## 2017-03-23 NOTE — Progress Notes (Signed)
GYN ENCOUNTER NOTE  Subjective:       Kaylee Reyes is a 66 y.o. [phone] female is here for gynecologic evaluation of the following issues:  1. Transfer of care 2. Questionable pelvic organ prolapse  66 year old female transition care from [place] post exploratory laparotomy with abdominal cervical hysterectomy bilateral salpingo-oophorectomy in 2014; patient had a cervical fibroid; cervix was left for vaginal vault support. Patient notes increased vaginal irritation and burning without obvious prolapse of structures at the introitus. Patient has IBS symptoms including both constipation and diarrhea Patient does have mixed incontinence with both stress urinary incontinence and an unstable bladder; she is not interested in being on any medication at this time. Patient has had a DEXA scan in 2017 which was normal. Patient is due to have a colonoscopy this upcoming year with Dr. Allen Norris Patient has history of adenocarcinoma of the right breast; status post bilateral mastectomy; status post chemotherapy finishing in March 2017; status post x-ray therapy in July 2017. Currently no evidence of disease.  Patient is no longer getting mammograms; she is getting twice yearly breast exams by radiation oncology   Gynecologic History No LMP recorded. Patient has had a hysterectomy. Abdominal supracervical hysterectomy BSO Contraception: status post hysterectomy Last Pap: ?  Obstetric History OB History  Gravida Para Term Preterm AB Living  3 2 2   1 2   SAB TAB Ectopic Multiple Live Births  1       2    # Outcome Date GA Lbr Len/2nd Weight Sex Delivery Anes PTL Lv  3 Term 1988   11 lb 14.4 oz (5.398 kg) F CS-LTranv   LIV  2 SAB 1976          1 Term 1975   9 lb 12.8 oz (4.445 kg) F Vag-Spont   LIV      Past Medical History:  Diagnosis Date  . Cancer ([place]) 09/2015   breast cancer  . GERD (gastroesophageal reflux disease)   . History of chicken pox   . Hyperlipidemia   .  Hypertension   . Hypothyroidism   . PONV (postoperative nausea and vomiting)    exploratory surgery at [facility]  . Thyroid disease     Past Surgical History:  Procedure Laterality Date  . ABDOMINAL HYSTERECTOMY  07/13/2013   Total. with BSO for postmenopausal bleeding and fibroids with large cervical polyp. Dr. Levy Sjogren and Dr. Glennon Mac at [facility]  . ABDOMINAL ULTRASOUND  04/17/2011   Hepatomegaly with borderline slenomegaly. Suggestive of fatty liver. s/p cholecystectomy. Portions of aorta obscured  . BREAST SURGERY Right 2011   BREAST BIOPSY  . CESAREAN SECTION  1988  . CHOLECYSTECTOMY  1990  . EXPLORATORY LAPAROTOMY     Left breast  . MASTECTOMY W/ SENTINEL NODE BIOPSY Bilateral 10/28/2015   Procedure: MASTECTOMY WITH SENTINEL LYMPH NODE BIOPSY;  Surgeon: Hubbard Robinson, MD;  Location: [facility] ORS;  Service: General;  Laterality: Bilateral;  . PORT-A-CATH REMOVAL Left 10/11/2016   Procedure: REMOVAL PORT-A-CATH;  Surgeon: Hubbard Robinson, MD;  Location: [facility] ORS;  Service: General;  Laterality: Left;  . PORTACATH PLACEMENT Left 12/23/2015   Procedure: INSERTION PORT-A-CATH;  Surgeon: Hubbard Robinson, MD;  Location: [facility] ORS;  Service: General;  Laterality: Left;  . WRIST SURGERY      Current Outpatient Prescriptions on File Prior to Visit  Medication Sig Dispense Refill  . atenolol (TENORMIN) 50 MG tablet TAKE 1 TABLET BY MOUTH DAILY 90 tablet 4  . B Complex-C (  SUPER B COMPLEX PO) Take 1 tablet by mouth daily.    . calcium carbonate (OS-CAL) 600 MG TABS tablet Take 1 tablet (600 mg total) by mouth 2 (two) times daily with a meal. 60 tablet 5  . Cholecalciferol (VITAMIN D3) 2000 units TABS Take 2,000 Units by mouth daily.    . cyclobenzaprine (FLEXERIL) 5 MG tablet Take 1 tablet (5 mg total) by mouth 3 (three) times daily as needed for muscle spasms. 30 tablet 1  . docusate sodium (COLACE) 100 MG capsule Take 200 mg by mouth daily.     . fexofenadine (ALLEGRA) 180 MG tablet Take 180  mg by mouth daily as needed for allergies.     . hydrochlorothiazide (HYDRODIURIL) 25 MG tablet TAKE 1 TABLET BY MOUTH EVERY DAY 30 tablet 11  . hydrocortisone (CORTEF) 20 MG tablet   0  . ibuprofen (ADVIL,MOTRIN) 200 MG tablet Take 400 mg by mouth every 6 (six) hours as needed for mild pain.    Marland Kitchen ketoconazole (NIZORAL) 2 % cream Apply 1 application topically daily. 50 g 3  . letrozole (FEMARA) 2.5 MG tablet Take 1 tablet (2.5 mg total) by mouth daily. 90 tablet 1  . levothyroxine (SYNTHROID, LEVOTHROID) 75 MCG tablet TAKE 1 TABLET(75 MCG) BY MOUTH DAILY 30 tablet 12  . magic mouthwash SOLN RINSE AND SPIT ONE TEASPOONFUL BY MOUTH EVERY 3 TO 4 HOURS AS NEEDED 100 mL 1  . Magnesium 400 MG TABS Take 400 mg by mouth daily.    . meclizine (ANTIVERT) 25 MG tablet TAKE ONE TABLET BY MOUTH EVERY 4 TO 6 HOURS AS NEEDED FOR NAUSEA 30 tablet 3  . Misc Natural Products (TURMERIC CURCUMIN) CAPS Take 1 capsule by mouth daily.    Marland Kitchen nystatin (MYCOSTATIN/NYSTOP) powder Apply topically 2 (two) times daily. Apply to affected area for one week twice daily. 15 g 0   Current Facility-Administered Medications on File Prior to Visit  Medication Dose Route Frequency Provider Last Rate Last Dose  . sodium chloride flush (NS) 0.9 % injection 10 mL  10 mL Intravenous PRN Lloyd Huger, MD   10 mL at 02/09/16 0930  . sodium chloride flush (NS) 0.9 % injection 10 mL  10 mL Intravenous PRN Lloyd Huger, MD   10 mL at 06/21/16 0950    Allergies  Allergen Reactions  . Penicillins Shortness Of Breath    Has patient had a PCN reaction causing immediate rash, facial/tongue/throat swelling, SOB or lightheadedness with hypotension: Yes Has patient had a PCN reaction causing severe rash involving mucus membranes or skin necrosis: No Has patient had a PCN reaction that required hospitalization No Has patient had a PCN reaction occurring within the last 10 years: No If all of the above answers are "NO", then may proceed  with Cephalosporin use.   . Nickel Hives  . Pravastatin Sodium     Muscle pain, muscle cramps, muscle weakness    Social History   Social History  . Marital status: Married    Spouse name: N/A  . Number of children: 2  . Years of education: N/A   Occupational History  . Not on file.   Social History Main Topics  . Smoking status: Never Smoker  . Smokeless tobacco: Never Used  . Alcohol use No  . Drug use: No  . Sexual activity: No   Other Topics Concern  . Not on file   Social History Narrative  . No narrative on file    Family  History  Problem Relation Age of Onset  . Hypertension Mother   . Thyroid disease Mother   . Hypertension Brother   . Diabetes Brother   . Breast cancer Neg Hx   . Ovarian cancer Neg Hx   . Colon cancer Neg Hx     The following portions of the patient's history were reviewed and updated as appropriate: allergies, current medications, past family history, past medical history, past social history, past surgical history and problem list.  Review of Systems Review of Systems  Constitutional: Negative.   HENT: Negative.   Eyes: Negative.   Respiratory: Negative.   Cardiovascular: Negative.   Gastrointestinal: Positive for abdominal pain, constipation and diarrhea.       History of irritable bowel syndrome  Genitourinary: Negative.        History of mixed incontinence No vaginal bleeding  Musculoskeletal: Negative.   Skin: Negative.   Neurological: Negative.   Endo/Heme/Allergies: Negative.   Psychiatric/Behavioral: Negative.      Objective:   BP (!) 161/80   Pulse 72   Ht 5\' 6"  (1.676 m)   Wt 295 lb 11.2 oz (134.1 kg)   BMI 47.73 kg/m  CONSTITUTIONAL: Well-developed, well-nourished female in no acute distress.  HENT:  Normocephalic, atraumatic.  NECK: Not examined SKIN: Skin is warm and dry. No rash noted. Not diaphoretic. No erythema. No pallor. [place]: Alert and oriented to person, place, and time. PSYCHIATRIC:  Normal mood and affect. Normal behavior. Normal judgment and thought content. CARDIOVASCULAR:Not Examined RESPIRATORY: Not Examined BREASTS: Not Examined ABDOMEN: Soft, non distended; Non tender.  No Organomegaly. PELVIC:  External Genitalia: Normal  BUS: Normal  Vagina: Moderate to severe atrophy  Cervix: Normal; no lesions; no discharge; situated high vaginal vault  Uterus:  Surgically absent  Adnexa: Surgically absent ; no masses  RV: Normal external exam; normal sphincter tone; no rectal masses; mild rectocele  Bladder: Nontender MUSCULOSKELETAL: Normal range of motion. No tenderness.  No cyanosis, clubbing, or edema.     Assessment:   1. Vaginal atrophy  2. Cystocele, midline  3. Rectocele  4. Malignant neoplasm of right breast in female, estrogen receptor positive, unspecified site of breast ([place])  5. Menopause  6. Morbid obesity ([place])   7. Mixed Incontinence  Plan:   1. Reassurance is given regarding prolapse issues 2. Return in July 2018 for annual gynecologic exam 3. Patient declines medical management for mixed Incontinence  A total of 30 minutes were spent face-to-face with the patient during the encounter with greater than 50% dealing with counseling and coordination of care.  Brayton Mars, MD  Note: This dictation was prepared with Dragon dictation along with smaller phrase technology. Any transcriptional errors that result from this process are unintentional.

## 2017-03-23 NOTE — Patient Instructions (Signed)
1. No significant prolapse is noted today 2. Return in July 2018 for annual exam

## 2017-03-30 ENCOUNTER — Ambulatory Visit: Payer: Self-pay | Admitting: Certified Nurse Midwife

## 2017-04-04 ENCOUNTER — Encounter: Payer: Self-pay | Admitting: Family Medicine

## 2017-04-04 ENCOUNTER — Ambulatory Visit (INDEPENDENT_AMBULATORY_CARE_PROVIDER_SITE_OTHER): Payer: BLUE CROSS/BLUE SHIELD | Admitting: Family Medicine

## 2017-04-04 VITALS — BP 132/78 | HR 66 | Temp 98.0°F | Resp 16 | Wt 301.0 lb

## 2017-04-04 DIAGNOSIS — K760 Fatty (change of) liver, not elsewhere classified: Secondary | ICD-10-CM

## 2017-04-04 DIAGNOSIS — E7800 Pure hypercholesterolemia, unspecified: Secondary | ICD-10-CM | POA: Diagnosis not present

## 2017-04-04 DIAGNOSIS — I10 Essential (primary) hypertension: Secondary | ICD-10-CM | POA: Diagnosis not present

## 2017-04-04 DIAGNOSIS — E039 Hypothyroidism, unspecified: Secondary | ICD-10-CM | POA: Diagnosis not present

## 2017-04-04 DIAGNOSIS — R590 Localized enlarged lymph nodes: Secondary | ICD-10-CM

## 2017-04-04 NOTE — Progress Notes (Signed)
Patient: Kaylee Reyes Female    DOB: [birth year]   66 y.o.   MRN: [id number] Visit Date: 04/04/2017  Today's Provider: Lelon Huh, MD   Chief Complaint  Patient presents with  . Adenopathy   Subjective:    HPI Swollen glands:  Patient comes in reporting that her glands on the right side of her neck are swollen and tender. These symptoms started 5 days ago and are unchanged.  She is also due for follow up of hyperlipidemia, hypertension,and hypothyroid. She is not currently taking a statin due to previous adverse reaction to pravastatin.  Aside from soreness in neck as above, she is feeling well.   Lab Results  Component Value Date   TSH 2.020 05/26/2016   Lab Results  Component Value Date   CHOL 261 (A) 01/22/2015   HDL 45 01/22/2015   LDLCALC 175 01/22/2015   TRIG 204 (A) 01/22/2015       Allergies  Allergen Reactions  . Penicillins Shortness Of Breath    Has patient had a PCN reaction causing immediate rash, facial/tongue/throat swelling, SOB or lightheadedness with hypotension: Yes Has patient had a PCN reaction causing severe rash involving mucus membranes or skin necrosis: No Has patient had a PCN reaction that required hospitalization No Has patient had a PCN reaction occurring within the last 10 years: No If all of the above answers are "NO", then may proceed with Cephalosporin use.   . Nickel Hives  . Pravastatin Sodium     Muscle pain, muscle cramps, muscle weakness     Current Outpatient Prescriptions:  .  atenolol (TENORMIN) 50 MG tablet, TAKE 1 TABLET BY MOUTH DAILY, Disp: 90 tablet, Rfl: 4 .  B Complex-C (SUPER B COMPLEX PO), Take 1 tablet by mouth daily., Disp: , Rfl:  .  calcium carbonate (OS-CAL) 600 MG TABS tablet, Take 1 tablet (600 mg total) by mouth 2 (two) times daily with a meal., Disp: 60 tablet, Rfl: 5 .  Cholecalciferol (VITAMIN D3) 2000 units TABS, Take 2,000 Units by mouth daily., Disp: , Rfl:  .  cyclobenzaprine (FLEXERIL)  5 MG tablet, Take 1 tablet (5 mg total) by mouth 3 (three) times daily as needed for muscle spasms., Disp: 30 tablet, Rfl: 1 .  docusate sodium (COLACE) 100 MG capsule, Take 200 mg by mouth daily. , Disp: , Rfl:  .  fexofenadine (ALLEGRA) 180 MG tablet, Take 180 mg by mouth daily as needed for allergies. , Disp: , Rfl:  .  hydrochlorothiazide (HYDRODIURIL) 25 MG tablet, TAKE 1 TABLET BY MOUTH EVERY DAY, Disp: 30 tablet, Rfl: 11 .  hydrocortisone (CORTEF) 20 MG tablet, , Disp: , Rfl: 0 .  ibuprofen (ADVIL,MOTRIN) 200 MG tablet, Take 400 mg by mouth every 6 (six) hours as needed for mild pain., Disp: , Rfl:  .  ketoconazole (NIZORAL) 2 % cream, Apply 1 application topically daily., Disp: 50 g, Rfl: 3 .  letrozole (FEMARA) 2.5 MG tablet, Take 1 tablet (2.5 mg total) by mouth daily., Disp: 90 tablet, Rfl: 1 .  levothyroxine (SYNTHROID, LEVOTHROID) 75 MCG tablet, TAKE 1 TABLET(75 MCG) BY MOUTH DAILY, Disp: 30 tablet, Rfl: 12 .  magic mouthwash SOLN, RINSE AND SPIT ONE TEASPOONFUL BY MOUTH EVERY 3 TO 4 HOURS AS NEEDED, Disp: 100 mL, Rfl: 1 .  Magnesium 400 MG TABS, Take 400 mg by mouth daily., Disp: , Rfl:  .  meclizine (ANTIVERT) 25 MG tablet, TAKE ONE TABLET BY MOUTH EVERY 4 TO  6 HOURS AS NEEDED FOR NAUSEA, Disp: 30 tablet, Rfl: 3 .  Misc Natural Products (TURMERIC CURCUMIN) CAPS, Take 1 capsule by mouth daily., Disp: , Rfl:  .  nystatin (MYCOSTATIN/NYSTOP) powder, Apply topically 2 (two) times daily. Apply to affected area for one week twice daily., Disp: 15 g, Rfl: 0 No current facility-administered medications for this visit.   Facility-Administered Medications Ordered in Other Visits:  .  sodium chloride flush (NS) 0.9 % injection 10 mL, 10 mL, Intravenous, PRN, Lloyd Huger, MD, 10 mL at 02/09/16 0930 .  sodium chloride flush (NS) 0.9 % injection 10 mL, 10 mL, Intravenous, PRN, Lloyd Huger, MD, 10 mL at 06/21/16 0950  Review of Systems  Constitutional: Negative for appetite change,  chills, fatigue and fever.  HENT: Positive for postnasal drip. Negative for congestion, drooling, ear discharge, ear pain, facial swelling, mouth sores, nosebleeds and trouble swallowing.   Eyes: Negative for photophobia, pain, discharge, redness, itching and visual disturbance.  Respiratory: Negative for chest tightness and shortness of breath.   Cardiovascular: Negative for chest pain and palpitations.  Gastrointestinal: Negative for abdominal pain, nausea and vomiting.  Musculoskeletal: Positive for neck pain (right side of her neck).  Neurological: Negative for dizziness and weakness.    Social History  Substance Use Topics  . Smoking status: Never Smoker  . Smokeless tobacco: Never Used  . Alcohol use No   Objective:   BP 132/78 (BP Location: Left Wrist, Patient Position: Sitting, Cuff Size: Large)   Pulse 66   Temp 98 F (36.7 C) (Oral)   Resp 16   Wt (!) 301 lb (136.5 kg)   SpO2 96% Comment: room air  BMI 48.58 kg/m  Vitals:   04/04/17 1040  Resp: 16  Weight: (!) 301 lb (136.5 kg)     Physical Exam  General Appearance:    Alert, cooperative, no distress  HENT:   bilateral TM normal without fluid or infection, neck has bilateral anterior cervical nodes enlarged, sinuses nontender and nasal mucosa pale and congested  Eyes:    PERRL, conjunctiva/corneas clear, EOM's intact       Lungs:     Clear to auscultation bilaterally, respirations unlabored  Heart:    Regular rate and rhythm  Neurologic:   Awake, alert, oriented x 3. No apparent focal neurological           defect.           Assessment & Plan:     1. Lymphadenopathy, anterior cervical Likely transient vi.ral LAD. No sign of bacterial infections.  - CBC with Differential/Platelet  2. Essential hypertension Well controlled.  Continue current medications.    3. Fatty infiltration of liver   4. Hypothyroidism, unspecified type  - T4 AND TSH  5. Hypercholesterolemia Intolerant to pravastatin - Lipid  panel - Comprehensive metabolic panel - CBC with Differential/Platelet       Lelon Huh, MD  [facility]

## 2017-04-05 LAB — CBC WITH DIFFERENTIAL/PLATELET
BASOS ABS: 0 10*3/uL (ref 0.0–0.2)
BASOS: 0 %
EOS (ABSOLUTE): 0.1 10*3/uL (ref 0.0–0.4)
Eos: 2 %
Hematocrit: 34.1 % (ref 34.0–46.6)
Hemoglobin: 11.6 g/dL (ref 11.1–15.9)
IMMATURE GRANS (ABS): 0 10*3/uL (ref 0.0–0.1)
Immature Granulocytes: 0 %
LYMPHS: 15 %
Lymphocytes Absolute: 1.2 10*3/uL (ref 0.7–3.1)
MCH: 28.2 pg (ref 26.6–33.0)
MCHC: 34 g/dL (ref 31.5–35.7)
MCV: 83 fL (ref 79–97)
MONOS ABS: 0.3 10*3/uL (ref 0.1–0.9)
Monocytes: 4 %
NEUTROS ABS: 6.4 10*3/uL (ref 1.4–7.0)
NEUTROS PCT: 79 %
PLATELETS: 219 10*3/uL (ref 150–379)
RBC: 4.11 x10E6/uL (ref 3.77–5.28)
RDW: 14.6 % (ref 12.3–15.4)
WBC: 8.1 10*3/uL (ref 3.4–10.8)

## 2017-04-05 LAB — COMPREHENSIVE METABOLIC PANEL
A/G RATIO: 1.2 (ref 1.2–2.2)
ALBUMIN: 4.2 g/dL (ref 3.6–4.8)
ALK PHOS: 108 IU/L (ref 39–117)
ALT: 29 IU/L (ref 0–32)
AST: 27 IU/L (ref 0–40)
BILIRUBIN TOTAL: 0.4 mg/dL (ref 0.0–1.2)
BUN / CREAT RATIO: 14 (ref 12–28)
BUN: 12 mg/dL (ref 8–27)
CHLORIDE: 99 mmol/L (ref 96–106)
CO2: 27 mmol/L (ref 18–29)
Calcium: 9.9 mg/dL (ref 8.7–10.3)
Creatinine, Ser: 0.84 mg/dL (ref 0.57–1.00)
GFR calc Af Amer: 85 mL/min/{1.73_m2} (ref >59)
GFR calc non Af Amer: 74 mL/min/{1.73_m2} (ref >59)
Globulin, Total: 3.4 g/dL (ref 1.5–4.5)
Glucose: 111 mg/dL — ABNORMAL HIGH (ref 65–99)
POTASSIUM: 4.6 mmol/L (ref 3.5–5.2)
Sodium: 143 mmol/L (ref 134–144)
TOTAL PROTEIN: 7.6 g/dL (ref 6.0–8.5)

## 2017-04-05 LAB — LIPID PANEL
CHOL/HDL RATIO: 6.4 ratio — AB (ref 0.0–4.4)
Cholesterol, Total: 248 mg/dL — ABNORMAL HIGH (ref 100–199)
HDL: 39 mg/dL — AB (ref >39)
LDL Calculated: 166 mg/dL — ABNORMAL HIGH (ref 0–99)
TRIGLYCERIDES: 213 mg/dL — AB (ref 0–149)
VLDL CHOLESTEROL CAL: 43 mg/dL — AB (ref 5–40)

## 2017-04-05 LAB — T4 AND TSH
T4, Total: 7.4 ug/dL (ref 4.5–12.0)
TSH: 2.2 u[IU]/mL (ref 0.450–4.500)

## 2017-04-06 ENCOUNTER — Telehealth: Payer: Self-pay | Admitting: *Deleted

## 2017-04-06 ENCOUNTER — Telehealth: Payer: Self-pay

## 2017-04-06 MED ORDER — EZETIMIBE 10 MG PO TABS: 10.0000 mg | ORAL_TABLET | Freq: Every day | ORAL | 3 refills | Status: DC

## 2017-04-06 NOTE — Telephone Encounter (Signed)
See result note.  

## 2017-04-06 NOTE — Telephone Encounter (Signed)
-----   Message from Birdie Sons, MD sent at 04/06/2017 10:21 AM EDT ----- Cholesterol is still high at 248. Should try a non-statin medication to get cholesterol down. Try Zetia 10mg  once a day, #30, rf x 3. This should not effect muscles the way pravastatin did. Rest of labs are good. Follow up for cholesterol. Call if any problems with Zetia.

## 2017-04-06 NOTE — Telephone Encounter (Signed)
Patient was notified of results. Expressed understanding. Rx sent to pharmacy. 

## 2017-04-06 NOTE — Telephone Encounter (Signed)
Patient called wanting to know if her lab results were in. Results are in patients chart waiting for review.

## 2017-04-07 ENCOUNTER — Other Ambulatory Visit: Payer: Self-pay | Admitting: Family Medicine

## 2017-05-04 ENCOUNTER — Ambulatory Visit: Payer: Self-pay | Admitting: Surgery

## 2017-05-16 ENCOUNTER — Encounter: Payer: Self-pay | Admitting: Surgery

## 2017-05-16 ENCOUNTER — Ambulatory Visit (INDEPENDENT_AMBULATORY_CARE_PROVIDER_SITE_OTHER): Payer: Medicare Other | Admitting: Surgery

## 2017-05-16 VITALS — BP 169/89 | HR 84 | Temp 98.2°F | Ht 66.0 in | Wt 301.8 lb

## 2017-05-16 DIAGNOSIS — C50911 Malignant neoplasm of unspecified site of right female breast: Secondary | ICD-10-CM | POA: Diagnosis not present

## 2017-05-16 NOTE — Patient Instructions (Signed)
We have sent in your refill of your Nystatin to your pharmacy. Please use this as needed and call if you have any other skin concerns.  We will see you back in 6 months. You will not need any further imaging. After this coming appointment, if everything is ok- we will go to a yearly check-up on your mastectomy sites.  If you have any questions or concerns at [facility], please call and we will work you in to be seen by Dr. Hampton Abbot.

## 2017-05-16 NOTE — Progress Notes (Signed)
05/16/2017  History of Present Illness: Kaylee Reyes is a 66 y.o. female s/p bilateral mastectomy with sentinel node biopsy on 10/28/15 for right breast cancer with suspicious nodules on the left breast.  She presents for 6 month follow up.  She was last seen on 2/16 with a mild yeast infection of the left breast lateral aspect of the scar, and has been using nystatin powder as needed.  She reports that the medications that she's on cause her night sweats and she sleeps on her left side, which then causes the moisture to accumulate on the lateral portion of the left scar.    She denies feeling any lumps or masses on either side. Denies any fevers or chills. Denies any chest pain or shortness of breath. She saw Dr. Grayland Ormond on 4/16 and continues following with him.  Past Medical History: Past Medical History:  Diagnosis Date  . Cancer ([place]) 09/2015   breast cancer  . GERD (gastroesophageal reflux disease)   . History of chicken pox   . Hyperlipidemia   . Hypertension   . Hypothyroidism   . PONV (postoperative nausea and vomiting)    exploratory surgery at [facility]  . Thyroid disease      Past Surgical History: Past Surgical History:  Procedure Laterality Date  . ABDOMINAL HYSTERECTOMY  07/13/2013   Total. with BSO for postmenopausal bleeding and fibroids with large cervical polyp. Dr. Levy Sjogren and Dr. Glennon Mac at [facility]  . ABDOMINAL ULTRASOUND  04/17/2011   Hepatomegaly with borderline slenomegaly. Suggestive of fatty liver. s/p cholecystectomy. Portions of aorta obscured  . BREAST SURGERY Right 2011   BREAST BIOPSY  . CESAREAN SECTION  1988  . CHOLECYSTECTOMY  1990  . EXPLORATORY LAPAROTOMY     Left breast  . MASTECTOMY W/ SENTINEL NODE BIOPSY Bilateral 10/28/2015   Procedure: MASTECTOMY WITH SENTINEL LYMPH NODE BIOPSY;  Surgeon: Hubbard Robinson, MD;  Location: [facility] ORS;  Service: General;  Laterality: Bilateral;  . PORT-A-CATH REMOVAL Left 10/11/2016   Procedure: REMOVAL  PORT-A-CATH;  Surgeon: Hubbard Robinson, MD;  Location: [facility] ORS;  Service: General;  Laterality: Left;  . PORTACATH PLACEMENT Left 12/23/2015   Procedure: INSERTION PORT-A-CATH;  Surgeon: Hubbard Robinson, MD;  Location: [facility] ORS;  Service: General;  Laterality: Left;  . WRIST SURGERY      Home Medications: Prior to Admission medications   Medication Sig Start Date End Date Taking? Authorizing Provider  atenolol (TENORMIN) 50 MG tablet TAKE 1 TABLET BY MOUTH DAILY 10/27/16  Yes Birdie Sons, MD  B Complex-C (SUPER B COMPLEX PO) Take 1 tablet by mouth daily.   Yes [provider]  calcium carbonate (OS-CAL) 600 MG TABS tablet Take 1 tablet (600 mg total) by mouth 2 (two) times daily with a meal. 12/15/16  Yes Lloyd Huger, MD  Cholecalciferol (VITAMIN D3) 2000 units TABS Take 2,000 Units by mouth daily.   Yes [provider]  cyclobenzaprine (FLEXERIL) 5 MG tablet Take 1 tablet (5 mg total) by mouth 3 (three) times daily as needed for muscle spasms. 11/18/16  Yes Birdie Sons, MD  docusate sodium (COLACE) 100 MG capsule Take 200 mg by mouth daily.    Yes [provider]  fexofenadine (ALLEGRA) 180 MG tablet Take 180 mg by mouth daily as needed for allergies.    Yes [provider]  hydrochlorothiazide (HYDRODIURIL) 25 MG tablet TAKE 1 TABLET BY MOUTH EVERY DAY 10/09/16  Yes Birdie Sons, MD  ibuprofen (ADVIL,MOTRIN) 200  MG tablet Take 400 mg by mouth every 6 (six) hours as needed for mild pain.   Yes [provider]  ketoconazole (NIZORAL) 2 % cream Apply 1 application topically daily. Patient taking differently: Apply 1 application topically daily as needed.  10/29/16  Yes Birdie Sons, MD  letrozole [facility]) 2.5 MG tablet Take 1 tablet (2.5 mg total) by mouth daily. 10/08/16  Yes Lloyd Huger, MD  levothyroxine (SYNTHROID, LEVOTHROID) 75 MCG tablet TAKE 1 TABLET(75 MCG) BY MOUTH DAILY 07/13/16  Yes Birdie Sons, MD   magic mouthwash SOLN RINSE AND SPIT ONE TEASPOONFUL BY MOUTH EVERY 3 TO 4 HOURS AS NEEDED 01/15/17  Yes Birdie Sons, MD  Magnesium 400 MG TABS Take 400 mg by mouth daily.   Yes [provider]  meclizine (ANTIVERT) 25 MG tablet TAKE ONE TABLET BY MOUTH EVERY 4 TO 6 HOURS AS NEEDED FOR NAUSEA 05/25/16  Yes Fisher, Kirstie Peri, MD  Misc Natural Products (TURMERIC CURCUMIN) CAPS Take 1 capsule by mouth daily.   Yes [provider]  nystatin (MYCOSTATIN/NYSTOP) powder Apply topically 2 (two) times daily. Apply to affected area for one week twice daily. Patient taking differently: Apply 1 g topically 2 (two) times daily as needed. Apply to affected area for one week twice daily. 01/21/17  Yes Olean Ree, MD    Allergies: Allergies  Allergen Reactions  . Penicillins Shortness Of Breath    Has patient had a PCN reaction causing immediate rash, facial/tongue/throat swelling, SOB or lightheadedness with hypotension: Yes Has patient had a PCN reaction causing severe rash involving mucus membranes or skin necrosis: No Has patient had a PCN reaction that required hospitalization No Has patient had a PCN reaction occurring within the last 10 years: No If all of the above answers are "NO", then may proceed with Cephalosporin use.   . Nickel Hives  . Pravastatin Sodium Other (See Comments)    Myalgia    Review of Systems: Review of Systems  Constitutional: Negative for chills and fever.  Respiratory: Negative for shortness of breath.   Cardiovascular: Negative for chest pain.  Gastrointestinal: Negative for abdominal pain.  Skin: Positive for rash.    Physical Exam BP (!) 169/89   Pulse 84   Temp 98.2 F (36.8 C) (Oral)   Ht 5\' 6"  (1.676 m)   Wt (!) 136.9 kg (301 lb 12.8 oz)   BMI 48.71 kg/m  CONSTITUTIONAL: No acute distress, obese. LYMPH NODES:  No palpable lymph nodes over bilateral axilla and neck. BREAST:  Right mastectomy site scar is clean and dry without  erythema or evidence of infection.  No new wounds and healed well.  The left mastectomy site scar is clean, with an area of erythema measuring 1.5 cm over the lateral portion of the wound, between skin folds, consistent with yeast infection.  Left chest port-a-cath removal scar well healed as well. GI: The abdomen is soft, nondistended, nontender, obese. SKIN: small area of rash / erythema consistent with yeast infection over the lateral aspect of the left mastectomy scar.  This area measures approximately 1.5 cm diameter.  PSYCH:  Alert and oriented to person, place and time. Affect is normal.  Labs/Imaging: None  Assessment and Plan: This is a 66 y.o. female s/p bilateral mastectomy and sentinel node biopsy, complicated by bilateral flap necrosis and infection.  No new masses or lumps or lymph nodes palpable on examination today.  Will send new prescription for nystatin powder that the patient may  apply as needed for skin yeast infection of the left lateral mastectomy site.  She will follow up with [place] in 6 months for her 2 year follow up appointment.   Melvyn Neth, [place]

## 2017-05-18 ENCOUNTER — Other Ambulatory Visit: Payer: Self-pay

## 2017-05-18 DIAGNOSIS — B379 Candidiasis, unspecified: Secondary | ICD-10-CM

## 2017-05-18 MED ORDER — NYSTATIN 100000 UNIT/GM EX POWD: 1.0000 g | Freq: Two times a day (BID) | CUTANEOUS | 1 refills | Status: AC | PRN

## 2017-06-15 ENCOUNTER — Other Ambulatory Visit: Payer: Self-pay | Admitting: Family Medicine

## 2017-06-21 ENCOUNTER — Other Ambulatory Visit: Payer: Self-pay | Admitting: Family Medicine

## 2017-06-22 ENCOUNTER — Encounter: Payer: BLUE CROSS/BLUE SHIELD | Admitting: Obstetrics and Gynecology

## 2017-06-27 ENCOUNTER — Other Ambulatory Visit: Payer: Self-pay

## 2017-06-27 MED ORDER — LETROZOLE 2.5 MG PO TABS: 2.5000 mg | ORAL_TABLET | Freq: Every day | ORAL | 1 refills | Status: DC

## 2017-06-29 ENCOUNTER — Ambulatory Visit
Admission: RE | Admit: 2017-06-29 | Discharge: 2017-06-29 | Disposition: A | Discharge status: Home / Self Care | Payer: Medicare Other | Source: Ambulatory Visit | Attending: Radiation Oncology | Admitting: Radiation Oncology

## 2017-06-29 ENCOUNTER — Encounter: Payer: Self-pay | Admitting: Radiation Oncology

## 2017-06-29 VITALS — BP 162/91 | HR 65 | Temp 98.0°F | Resp 16 | Ht 66.0 in | Wt 279.6 lb

## 2017-06-29 DIAGNOSIS — Z923 Personal history of irradiation: Secondary | ICD-10-CM | POA: Insufficient documentation

## 2017-06-29 DIAGNOSIS — C50911 Malignant neoplasm of unspecified site of right female breast: Secondary | ICD-10-CM | POA: Diagnosis not present

## 2017-06-29 DIAGNOSIS — Z170 Estrogen receptor positive status [ER+]: Secondary | ICD-10-CM | POA: Insufficient documentation

## 2017-06-29 DIAGNOSIS — Z79811 Long term (current) use of aromatase inhibitors: Secondary | ICD-10-CM | POA: Insufficient documentation

## 2017-06-29 NOTE — Progress Notes (Signed)
Radiation Oncology Follow up Note  Name: Kaylee Reyes   Date:   06/29/2017 MRN:  [id number] DOB: [birth year]    This 66 y.o. 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 Seen today in routine follow-up she is doing well. She specifically denies any chest wall masses or nodularity any swelling of her upper extremities cough or bone pain. She is currently on. Femara tongue that well without side effect. Her hot flashes have improved.  COMPLICATIONS OF TREATMENT: none  FOLLOW UP COMPLIANCE: keeps appointments   PHYSICAL EXAM:  BP (!) 162/91 (BP Location: Left Wrist, Patient Position: Sitting, Cuff Size: Normal)   Pulse 65   Temp 98 F (36.7 C) (Tympanic)   Resp 16   Ht 5\' 6"  (1.676 m)   Wt 279 lb 9.8 oz (126.8 kg)   BMI 45.13 kg/m  Well-developed obese female in NAD she is status post bilateral mastectomies no evidence of chest wall nodularity or masses are identified. No evidence of axillary or supraclavicular adenopathy is noted. Well-developed well-nourished patient in NAD. HEENT reveals PERLA, EOMI, discs not visualized.  Oral cavity is clear. No oral mucosal lesions are identified. Neck is clear without evidence of cervical or supraclavicular adenopathy. Lungs are clear to A&P. Cardiac examination is essentially unremarkable with regular rate and rhythm without murmur rub or thrill. Abdomen is benign with no organomegaly or masses noted. Motor sensory and DTR levels are equal and symmetric in the upper and lower extremities. Cranial nerves II through XII are grossly intact.  Proprioception is intact. No peripheral adenopathy or edema is identified. No motor or sensory levels are noted. Crude visual fields are within normal range.  RADIOLOGY RESULTS: No current films for review  PLAN: Present time patient is doing well with no evidence of disease. I've asked to see her back in 1 year for follow-up. She continues on letrozole without side effect. She continues close follow-up care with both surgeon and medical oncology. Patient knows to call with any concerns.  I would like to take this opportunity to thank you for allowing me to participate in the care of your patient.Armstead Peaks., MD

## 2017-06-29 NOTE — Progress Notes (Signed)
Patient here for follow up no changes since last appointment 

## 2017-07-14 ENCOUNTER — Encounter: Payer: Self-pay | Admitting: *Deleted

## 2017-07-19 ENCOUNTER — Other Ambulatory Visit: Payer: Self-pay | Admitting: Family Medicine

## 2017-07-19 ENCOUNTER — Telehealth: Payer: Self-pay | Admitting: Family Medicine

## 2017-07-19 MED ORDER — HYDROCHLOROTHIAZIDE 25 MG PO TABS: 25.0000 mg | ORAL_TABLET | Freq: Every day | ORAL | 3 refills | Status: DC

## 2017-07-19 MED ORDER — LEVOTHYROXINE SODIUM 75 MCG PO TABS: 75.0000 ug | ORAL_TABLET | Freq: Every day | ORAL | 3 refills | Status: DC

## 2017-07-19 MED ORDER — LETROZOLE 2.5 MG PO TABS: 2.5000 mg | ORAL_TABLET | Freq: Every day | ORAL | 1 refills | Status: DC

## 2017-07-19 MED ORDER — ATENOLOL 50 MG PO TABS
50.0000 mg | ORAL_TABLET | Freq: Every day | ORAL | 4 refills | Status: DC
Start: 2017-07-19 — End: 2017-07-20

## 2017-07-19 NOTE — Telephone Encounter (Signed)
Please review. Thanks!  

## 2017-07-19 NOTE — Telephone Encounter (Signed)
Pt contacted office for refill request on the following medications:  levothyroxine (SYNTHROID, LEVOTHROID) 75 MCG tablet  letrozole (FEMARA) 2.5 MG tablet.  Pt request the accord brand.  atenolol (TENORMIN) 50 MG tablet  hydrochlorothiazide (HYDRODIURIL) 25 MG tablet  90 day supply.  [facility] mail order.  UQ#[phone]/LS

## 2017-07-19 NOTE — Telephone Encounter (Signed)
[facility] faxed refill request for the following medications:   1. letrozole (FEMARA) 2.5 MG tablet.  Pt request the accord brand.  2. atenolol (TENORMIN) 50 MG tablet  3. hydrochlorothiazide (HYDRODIURIL) 25 MG tablet  90 day supply.  [facility] mail order.  Pt called this morning requesting Rx for the medications to be sent to [facility]. It looks like the Rx were sent to the local pharmacy. Please advise. Thanks TNP

## 2017-07-20 MED ORDER — HYDROCHLOROTHIAZIDE 25 MG PO TABS: 25.0000 mg | ORAL_TABLET | Freq: Every day | ORAL | 4 refills | Status: DC

## 2017-07-20 MED ORDER — ATENOLOL 50 MG PO TABS: 50.0000 mg | ORAL_TABLET | Freq: Every day | ORAL | 4 refills | Status: DC

## 2017-07-20 MED ORDER — LETROZOLE 2.5 MG PO TABS: 2.5000 mg | ORAL_TABLET | Freq: Every day | ORAL | 4 refills | Status: DC

## 2017-07-20 NOTE — Telephone Encounter (Signed)
[place] and canceled the medications as stated below.

## 2017-07-20 NOTE — Telephone Encounter (Signed)
These were all sent to [facility] in [place] yesterday. Can you cancel all ofl the prescriptions prescriptions that were sent to [facility], including the prescription for letrozole sent in by Dr. Grayland Ormond.

## 2017-07-21 ENCOUNTER — Other Ambulatory Visit: Payer: Self-pay | Admitting: Family Medicine

## 2017-07-21 NOTE — Telephone Encounter (Signed)
[facility] faxed a refill request on the following medications:  levothyroxine (SYNTHROID, LEVOTHROID) 75 MCG tablet.  [facility] mail order.  Pt states she is almost of the Rx letrozole (FEMARA) 2.5 MG tablet. Pt is requesting a Rx sent to [facility] for a 30 day supply only.   OH#[phone]/TC

## 2017-07-22 ENCOUNTER — Encounter: Payer: Self-pay | Admitting: Obstetrics and Gynecology

## 2017-07-22 ENCOUNTER — Ambulatory Visit (INDEPENDENT_AMBULATORY_CARE_PROVIDER_SITE_OTHER): Payer: Medicare Other | Admitting: Obstetrics and Gynecology

## 2017-07-22 VITALS — BP 142/86 | HR 67 | Ht 66.0 in | Wt 273.7 lb

## 2017-07-22 DIAGNOSIS — N816 Rectocele: Secondary | ICD-10-CM | POA: Diagnosis not present

## 2017-07-22 DIAGNOSIS — N952 Postmenopausal atrophic vaginitis: Secondary | ICD-10-CM

## 2017-07-22 DIAGNOSIS — Z90711 Acquired absence of uterus with remaining cervical stump: Secondary | ICD-10-CM

## 2017-07-22 DIAGNOSIS — Z01419 Encounter for gynecological examination (general) (routine) without abnormal findings: Secondary | ICD-10-CM

## 2017-07-22 DIAGNOSIS — N8111 Cystocele, midline: Secondary | ICD-10-CM | POA: Diagnosis not present

## 2017-07-22 DIAGNOSIS — Z170 Estrogen receptor positive status [ER+]: Secondary | ICD-10-CM

## 2017-07-22 DIAGNOSIS — C50911 Malignant neoplasm of unspecified site of right female breast: Secondary | ICD-10-CM

## 2017-07-22 MED ORDER — LEVOTHYROXINE SODIUM 75 MCG PO TABS: 75.0000 ug | ORAL_TABLET | Freq: Every day | ORAL | 3 refills | Status: DC

## 2017-07-22 MED ORDER — LETROZOLE 2.5 MG PO TABS: 2.5000 mg | ORAL_TABLET | Freq: Every day | ORAL | 0 refills | Status: DC

## 2017-07-22 NOTE — Progress Notes (Signed)
ANNUAL PREVENTATIVE CARE GYN  ENCOUNTER NOTE  Subjective:       Kaylee Reyes is a 66 y.o. G55P2012 female here for a routine annual gynecologic exam.  Current complaints: 1.  None  66 year old white female status post abdominal supracervical hysterectomy bilateral salpingo-oophorectomy in 2014, history of cervical fibroid, cervix was maintained to help with vaginal vault support, presents for her annual exam. Patient is status post bilateral mastectomy, chemotherapy, and x-ray therapy for adenocarcinoma of the right breast, finishing all therapies in July 2017. Patient is currently without evidence of disease. Patient is no longer getting mammograms. She has had a transition of care providers with her surgeon having left the area this past year.  Patient has lost 22 pounds in the past year.   Gynecologic History No LMP recorded. Patient has had a hysterectomy. Abdominal supracervical Contraception: hyst Last Pap: 2017 . Results were: normal Last mammogram: 2016 breast ca-. Results were: abnormal DEXA scan 2017 normal  Obstetric History OB History  Gravida Para Term Preterm AB Living  3 2 2   1 2   SAB TAB Ectopic Multiple Live Births  1       2    # Outcome Date GA Lbr Len/2nd Weight Sex Delivery Anes PTL Lv  3 Term 1988   11 lb 14.4 oz (5.398 kg) F CS-LTranv   LIV  2 SAB 1976          1 Term 1975   9 lb 12.8 oz (4.445 kg) F Vag-Spont   LIV      Past Medical History:  Diagnosis Date  . Cancer ([place]) 09/2015   breast cancer  . GERD (gastroesophageal reflux disease)   . History of chicken pox   . Hyperlipidemia   . Hypertension   . Hypothyroidism   . PONV (postoperative nausea and vomiting)    exploratory surgery at [facility]  . Thyroid disease     Past Surgical History:  Procedure Laterality Date  . ABDOMINAL HYSTERECTOMY  07/13/2013   Total. with BSO for postmenopausal bleeding and fibroids with large cervical polyp. Dr. Levy Sjogren and Dr. Glennon Mac at [facility]  . ABDOMINAL  ULTRASOUND  04/17/2011   Hepatomegaly with borderline slenomegaly. Suggestive of fatty liver. s/p cholecystectomy. Portions of aorta obscured  . BREAST SURGERY Right 2011   BREAST BIOPSY  . CESAREAN SECTION  1988  . CHOLECYSTECTOMY  1990  . EXPLORATORY LAPAROTOMY     Left breast  . MASTECTOMY W/ SENTINEL NODE BIOPSY Bilateral 10/28/2015   Procedure: MASTECTOMY WITH SENTINEL LYMPH NODE BIOPSY;  Surgeon: Hubbard Robinson, MD;  Location: [facility] ORS;  Service: General;  Laterality: Bilateral;  . PORT-A-CATH REMOVAL Left 10/11/2016   Procedure: REMOVAL PORT-A-CATH;  Surgeon: Hubbard Robinson, MD;  Location: [facility] ORS;  Service: General;  Laterality: Left;  . PORTACATH PLACEMENT Left 12/23/2015   Procedure: INSERTION PORT-A-CATH;  Surgeon: Hubbard Robinson, MD;  Location: [facility] ORS;  Service: General;  Laterality: Left;  . WRIST SURGERY      Current Outpatient Prescriptions on File Prior to Visit  Medication Sig Dispense Refill  . atenolol (TENORMIN) 50 MG tablet Take 1 tablet (50 mg total) by mouth daily. 90 tablet 4  . B Complex-C (SUPER B COMPLEX PO) Take 1 tablet by mouth daily.    . calcium carbonate (OS-CAL) 600 MG TABS tablet Take 1 tablet (600 mg total) by mouth 2 (two) times daily with a meal. 60 tablet 5  . Cholecalciferol (VITAMIN D3) 2000 units TABS Take  2,000 Units by mouth daily.    . cyclobenzaprine (FLEXERIL) 5 MG tablet Take 1 tablet (5 mg total) by mouth 3 (three) times daily as needed for muscle spasms. 30 tablet 1  . docusate sodium (COLACE) 100 MG capsule Take 200 mg by mouth daily.     . hydrochlorothiazide (HYDRODIURIL) 25 MG tablet Take 1 tablet (25 mg total) by mouth daily. 90 tablet 4  . Hydrocortisone Micronized POWD RINSE AND SPIT 5ML BY MOUTH EVERY 3 TO 4 HOURS AS NEEDED 1 Bottle 4  . ibuprofen (ADVIL,MOTRIN) 200 MG tablet Take 400 mg by mouth every 6 (six) hours as needed for mild pain.    Marland Kitchen ketoconazole (NIZORAL) 2 % cream Apply 1 application topically daily.  (Patient taking differently: Apply 1 application topically daily as needed. ) 50 g 3  . letrozole (FEMARA) 2.5 MG tablet Take 1 tablet (2.5 mg total) by mouth daily. 30 tablet 0  . levothyroxine (SYNTHROID, LEVOTHROID) 75 MCG tablet Take 1 tablet (75 mcg total) by mouth daily. 90 tablet 3  . Loratadine (CLARITIN) 10 MG CAPS Take by mouth.    . Magnesium 400 MG TABS Take 400 mg by mouth daily.    . meclizine (ANTIVERT) 25 MG tablet TAKE ONE TABLET BY MOUTH EVERY 4 TO 6 HOURS AS NEEDED FOR NAUSEA 30 tablet 3  . Misc Natural Products (TURMERIC CURCUMIN) CAPS Take 1 capsule by mouth daily.     Current Facility-Administered Medications on File Prior to Visit  Medication Dose Route Frequency Provider Last Rate Last Dose  . sodium chloride flush (NS) 0.9 % injection 10 mL  10 mL Intravenous PRN Lloyd Huger, MD   10 mL at 02/09/16 0930  . sodium chloride flush (NS) 0.9 % injection 10 mL  10 mL Intravenous PRN Lloyd Huger, MD   10 mL at 06/21/16 0950    Allergies  Allergen Reactions  . Penicillins Shortness Of Breath    Has patient had a PCN reaction causing immediate rash, facial/tongue/throat swelling, SOB or lightheadedness with hypotension: Yes Has patient had a PCN reaction causing severe rash involving mucus membranes or skin necrosis: No Has patient had a PCN reaction that required hospitalization No Has patient had a PCN reaction occurring within the last 10 years: No If all of the above answers are "NO", then may proceed with Cephalosporin use.   . Nickel Hives  . Pravastatin Sodium Other (See Comments)    Myalgia     Social History   Social History  . Marital status: Married    Spouse name: N/A  . Number of children: 2  . Years of education: N/A   Occupational History  . Not on file.   Social History Main Topics  . Smoking status: Never Smoker  . Smokeless tobacco: Never Used  . Alcohol use No  . Drug use: No  . Sexual activity: No   Other Topics  Concern  . Not on file   Social History Narrative  . No narrative on file    Family History  Problem Relation Age of Onset  . Hypertension Mother   . Thyroid disease Mother   . Hypertension Brother   . Diabetes Brother   . Breast cancer Neg Hx   . Ovarian cancer Neg Hx   . Colon cancer Neg Hx     The following portions of the patient's history were reviewed and updated as appropriate: allergies, current medications, past family history, past medical history, past social history,  past surgical history and problem list.  Review of Systems Review of Systems  Constitutional: Positive for weight loss. Negative for chills and fever.       22 pound weight loss  HENT: Negative.   Eyes: Negative.   Respiratory: Negative.   Cardiovascular: Negative.   Gastrointestinal: Negative.   Genitourinary: Negative.   Musculoskeletal: Negative.   Skin: Negative.   Neurological: Negative for weakness.       Foot pain and paresthesias persist despite acupuncture Chemotherapy brain fog is intermittent  Endo/Heme/Allergies: Negative.   Psychiatric/Behavioral: Negative.       Objective:   BP (!) 142/86   Pulse 67   Ht 5\' 6"  (1.676 m)   Wt 273 lb 11.2 oz (124.1 kg)   BMI 44.18 kg/m  CONSTITUTIONAL: Well-developed, well-nourished female in no acute distress.  PSYCHIATRIC: Normal mood and affect. Normal behavior. Normal judgment and thought content. [place]: Alert and oriented to person, place, and time. Normal muscle tone coordination. No cranial nerve deficit noted. HENT:  Normocephalic, atraumatic, External right and left ear normal. Oropharynx is clear and moist EYES: Conjunctivae and EOM are normal.  No scleral icterus.  NECK: Normal range of motion, supple, no masses.  Normal thyroid.  SKIN: Skin is warm and dry. No rash noted. Not diaphoretic. No erythema. No pallor. CARDIOVASCULAR: Normal heart rate noted, regular rhythm, no murmur. RESPIRATORY: Clear to auscultation bilaterally.  Effort and breath sounds normal, no problems with respiration noted. BREASTS: Bilateral mastectomy changes are present; significant scarring of chest wall is noted. No area of increased tenderness, no new mass identified ABDOMEN: Soft, normal bowel sounds, no distention noted.  No tenderness, rebound or guarding.  BLADDER: Normal PELVIC:  External Genitalia: Normal  BUS: Normal  Vagina: Atrophic; first-degree cystocele; no enterocele  Cervix: Normal ; no lesions  Uterus: Surgically absent  Adnexa: No palpable masses or tenderness  RV: External Exam NormaI, No Rectal Masses and Normal Sphincter tone  MUSCULOSKELETAL: Normal range of motion. No tenderness.  No cyanosis, clubbing, or edema.  2+ distal pulses. LYMPHATIC: No Axillary, Supraclavicular, or Inguinal Adenopathy.    Assessment:   Annual gynecologic examination 66 y.o. Contraception: status post hysterectomy bmi 44 Problem List Items Addressed This Visit    Morbid obesity ([place])   Malignant neoplasm of right breast in female, estrogen receptor positive ([place])   Rectocele   Cystocele, midline   Vaginal atrophy    Other Visit Diagnoses    Well woman exam with routine gynecological exam    -  Primary      Plan:  Pap: Pap, Reflex if ASCUS Mammogram: Not Indicated Stool Guaiac Testing:  due for colonoscopy Labs: thru pcp Routine preventative health maintenance measures emphasized: Exercise/Diet/Weight control, Tobacco Warnings, Alcohol/Substance use risks and Stress Management Return to Clinic - 1 Year   [place], [place]' Brayton Mars, MD  Note: This dictation was prepared with Dragon dictation along with smaller phrase technology. Any transcriptional errors that result from this process are unintentional.

## 2017-07-22 NOTE — Patient Instructions (Signed)
1. Pap smear is done 2. Mammograms no longer necessary 3. Colonoscopy is recommended Or. Screening labs are to be obtained for primary care 5. Continue with calcium and vitamin D supplementation 1200 mg a day 6. Return in 1 year for annual exam 7. Confirm with Dr. Maryjane Hurter regarding breast exam surveillance for breast cancer monitoring   Health Maintenance for Postmenopausal Women Menopause is a normal process in which your reproductive ability comes to an end. This process happens gradually over a span of months to years, usually between the ages of 65 and 70. Menopause is complete when you have missed 12 consecutive menstrual periods. It is important to talk with your health care provider about some of the most common conditions that affect postmenopausal women, such as heart disease, cancer, and bone loss (osteoporosis). Adopting a healthy lifestyle and getting preventive care can help to promote your health and wellness. Those actions can also lower your chances of developing some of these common conditions. What should I know about menopause? During menopause, you may experience a number of symptoms, such as:  Moderate-to-severe hot flashes.  Night sweats.  Decrease in sex drive.  Mood swings.  Headaches.  Tiredness.  Irritability.  Memory problems.  Insomnia.  Choosing to treat or not to treat menopausal changes is an individual decision that you make with your health care provider. What should I know about hormone replacement therapy and supplements? Hormone therapy products are effective for treating symptoms that are associated with menopause, such as hot flashes and night sweats. Hormone replacement carries certain risks, especially as you become older. If you are thinking about using estrogen or estrogen with progestin treatments, discuss the benefits and risks with your health care provider. What should I know about heart disease and stroke? Heart disease, heart  attack, and stroke become more likely as you age. This may be due, in part, to the hormonal changes that your body experiences during menopause. These can affect how your body processes dietary fats, triglycerides, and cholesterol. Heart attack and stroke are both medical emergencies. There are many things that you can do to help prevent heart disease and stroke:  Have your blood pressure checked at least every 1-2 years. High blood pressure causes heart disease and increases the risk of stroke.  If you are 65-94 years old, ask your health care provider if you should take aspirin to prevent a heart attack or a stroke.  Do not use any tobacco products, including cigarettes, chewing tobacco, or electronic cigarettes. If you need help quitting, ask your health care provider.  It is important to eat a healthy diet and maintain a healthy weight. ? Be sure to include plenty of vegetables, fruits, low-fat dairy products, and lean protein. ? Avoid eating foods that are high in solid fats, added sugars, or salt (sodium).  Get regular exercise. This is one of the most important things that you can do for your health. ? Try to exercise for at least 150 minutes each week. The type of exercise that you do should increase your heart rate and make you sweat. This is known as moderate-intensity exercise. ? Try to do strengthening exercises at least twice each week. Do these in addition to the moderate-intensity exercise.  Know your numbers.Ask your health care provider to check your cholesterol and your blood glucose. Continue to have your blood tested as directed by your health care provider.  What should I know about cancer screening? There are several types of cancer. Take the  following steps to reduce your risk and to catch any cancer development as early as possible. Breast Cancer  Practice breast self-awareness. ? This means understanding how your breasts normally appear and feel. ? It also means  doing regular breast self-exams. Let your health care provider know about any changes, no matter how small.  If you are 65 or older, have a clinician do a breast exam (clinical breast exam or CBE) every year. Depending on your age, family history, and medical history, it may be recommended that you also have a yearly breast X-ray (mammogram).  If you have a family history of breast cancer, talk with your health care provider about genetic screening.  If you are at high risk for breast cancer, talk with your health care provider about having an MRI and a mammogram every year.  Breast cancer (BRCA) gene test is recommended for women who have family members with BRCA-related cancers. Results of the assessment will determine the need for genetic counseling and BRCA1 and for BRCA2 testing. BRCA-related cancers include these types: ? Breast. This occurs in males or females. ? Ovarian. ? Tubal. This may also be called fallopian tube cancer. ? Cancer of the abdominal or pelvic lining (peritoneal cancer). ? Prostate. ? Pancreatic.  Cervical, Uterine, and Ovarian Cancer Your health care provider may recommend that you be screened regularly for cancer of the pelvic organs. These include your ovaries, uterus, and vagina. This screening involves a pelvic exam, which includes checking for microscopic changes to the surface of your cervix (Pap test).  For women ages 21-65, health care providers may recommend a pelvic exam and a Pap test every three years. For women ages 16-65, they may recommend the Pap test and pelvic exam, combined with testing for human papilloma virus (HPV), every five years. Some types of HPV increase your risk of cervical cancer. Testing for HPV may also be done on women of any age who have unclear Pap test results.  Other health care providers may not recommend any screening for nonpregnant women who are considered low risk for pelvic cancer and have no symptoms. Ask your health care  provider if a screening pelvic exam is right for you.  If you have had past treatment for cervical cancer or a condition that could lead to cancer, you need Pap tests and screening for cancer for at least 20 years after your treatment. If Pap tests have been discontinued for you, your risk factors (such as having a new sexual partner) need to be reassessed to determine if you should start having screenings again. Some women have medical problems that increase the chance of getting cervical cancer. In these cases, your health care provider may recommend that you have screening and Pap tests more often.  If you have a family history of uterine cancer or ovarian cancer, talk with your health care provider about genetic screening.  If you have vaginal bleeding after reaching menopause, tell your health care provider.  There are currently no reliable tests available to screen for ovarian cancer.  Lung Cancer Lung cancer screening is recommended for adults 22-29 years old who are at high risk for lung cancer because of a history of smoking. A yearly low-dose CT scan of the lungs is recommended if you:  Currently smoke.  Have a history of at least 30 pack-years of smoking and you currently smoke or have quit within the past 15 years. A pack-year is smoking an average of one pack of cigarettes per day  for one year.  Yearly screening should:  Continue until it has been 15 years since you quit.  Stop if you develop a health problem that would prevent you from having lung cancer treatment.  Colorectal Cancer  This type of cancer can be detected and can often be prevented.  Routine colorectal cancer screening usually begins at age 24 and continues through age 58.  If you have risk factors for colon cancer, your health care provider may recommend that you be screened at an earlier age.  If you have a family history of colorectal cancer, talk with your health care provider about genetic  screening.  Your health care provider may also recommend using home test kits to check for hidden blood in your stool.  A small camera at the end of a tube can be used to examine your colon directly (sigmoidoscopy or colonoscopy). This is done to check for the earliest forms of colorectal cancer.  Direct examination of the colon should be repeated every 5-10 years until age 75. However, if early forms of precancerous polyps or small growths are found or if you have a family history or genetic risk for colorectal cancer, you may need to be screened more often.  Skin Cancer  Check your skin from head to toe regularly.  Monitor any moles. Be sure to tell your health care provider: ? About any new moles or changes in moles, especially if there is a change in a mole's shape or color. ? If you have a mole that is larger than the size of a pencil eraser.  If any of your family members has a history of skin cancer, especially at a young age, talk with your health care provider about genetic screening.  Always use sunscreen. Apply sunscreen liberally and repeatedly throughout the day.  Whenever you are outside, protect yourself by wearing long sleeves, pants, a wide-brimmed hat, and sunglasses.  What should I know about osteoporosis? Osteoporosis is a condition in which bone destruction happens more quickly than new bone creation. After menopause, you may be at an increased risk for osteoporosis. To help prevent osteoporosis or the bone fractures that can happen because of osteoporosis, the following is recommended:  If you are 15-62 years old, get at least 1,000 mg of calcium and at least 600 mg of vitamin D per day.  If you are older than age 65 but younger than age 25, get at least 1,200 mg of calcium and at least 600 mg of vitamin D per day.  If you are older than age 82, get at least 1,200 mg of calcium and at least 800 mg of vitamin D per day.  Smoking and excessive alcohol intake  increase the risk of osteoporosis. Eat foods that are rich in calcium and vitamin D, and do weight-bearing exercises several times each week as directed by your health care provider. What should I know about how menopause affects my mental health? Depression may occur at any age, but it is more common as you become older. Common symptoms of depression include:  Low or sad mood.  Changes in sleep patterns.  Changes in appetite or eating patterns.  Feeling an overall lack of motivation or enjoyment of activities that you previously enjoyed.  Frequent crying spells.  Talk with your health care provider if you think that you are experiencing depression. What should I know about immunizations? It is important that you get and maintain your immunizations. These include:  Tetanus, diphtheria, and pertussis (Tdap)  booster vaccine.  Influenza every year before the flu season begins.  Pneumonia vaccine.  Shingles vaccine.  Your health care provider may also recommend other immunizations. This information is not intended to replace advice given to you by your health care provider. Make sure you discuss any questions you have with your health care provider. Document Released: 01/14/2006 Document Revised: 06/11/2016 Document Reviewed: 08/26/2015 Elsevier Interactive Patient Education  2018 [employer].

## 2017-07-27 ENCOUNTER — Other Ambulatory Visit: Payer: Self-pay | Admitting: Family Medicine

## 2017-07-27 DIAGNOSIS — S161XXA Strain of muscle, fascia and tendon at neck level, initial encounter: Secondary | ICD-10-CM

## 2017-07-27 LAB — PAP IG W/ RFLX HPV ASCU: PAP SMEAR COMMENT: 0

## 2017-08-02 ENCOUNTER — Encounter: Payer: Self-pay | Admitting: Obstetrics and Gynecology

## 2017-08-02 ENCOUNTER — Telehealth: Payer: Self-pay | Admitting: Family Medicine

## 2017-08-02 DIAGNOSIS — Z1211 Encounter for screening for malignant neoplasm of colon: Secondary | ICD-10-CM

## 2017-08-02 NOTE — Telephone Encounter (Signed)
Please advise 

## 2017-08-19 ENCOUNTER — Telehealth: Payer: Self-pay

## 2017-08-19 ENCOUNTER — Other Ambulatory Visit: Payer: Self-pay

## 2017-08-19 DIAGNOSIS — Z1211 Encounter for screening for malignant neoplasm of colon: Secondary | ICD-10-CM

## 2017-08-19 NOTE — Telephone Encounter (Signed)
Gastroenterology Pre-Procedure Review  Request Date: 9/27 Requesting Physician: Dr. Allen Norris  PATIENT REVIEW QUESTIONS: The patient responded to the following health history questions as indicated:    1. Are you having any GI issues? no 2. Do you have a personal history of Polyps? no 3. Do you have a family history of Colon Cancer or Polyps? yes (patient: breast cancer with mastestomy) 4. Diabetes Mellitus? no 5. Joint replacements in the past 12 months?no 6. Major health problems in the past 3 months?no 7. Any artificial heart valves, MVP, or defibrillator?no    MEDICATIONS & ALLERGIES:    Patient reports the following regarding taking any anticoagulation/antiplatelet therapy:   Plavix, Coumadin, Eliquis, Xarelto, Lovenox, Pradaxa, Brilinta, or Effient? no Aspirin? no  Patient confirms/reports the following medications:  Current Outpatient Prescriptions  Medication Sig Dispense Refill  . atenolol (TENORMIN) 50 MG tablet Take 1 tablet (50 mg total) by mouth daily. 90 tablet 4  . B Complex-C (SUPER B COMPLEX PO) Take 1 tablet by mouth daily.    . calcium carbonate (OS-CAL) 600 MG TABS tablet Take 1 tablet (600 mg total) by mouth 2 (two) times daily with a meal. 60 tablet 5  . Cholecalciferol (VITAMIN D3) 2000 units TABS Take 2,000 Units by mouth daily.    . cyclobenzaprine (FLEXERIL) 5 MG tablet TAKE 1 TABLET(5 MG) BY MOUTH THREE TIMES DAILY AS NEEDED FOR MUSCLE SPASMS 30 tablet 5  . docusate sodium (COLACE) 100 MG capsule Take 200 mg by mouth daily.     . hydrochlorothiazide (HYDRODIURIL) 25 MG tablet Take 1 tablet (25 mg total) by mouth daily. 90 tablet 4  . Hydrocortisone Micronized POWD RINSE AND SPIT 5ML BY MOUTH EVERY 3 TO 4 HOURS AS NEEDED 1 Bottle 4  . ibuprofen (ADVIL,MOTRIN) 200 MG tablet Take 400 mg by mouth every 6 (six) hours as needed for mild pain.    Marland Kitchen ketoconazole (NIZORAL) 2 % cream Apply 1 application topically daily. (Patient taking differently: Apply 1 application  topically daily as needed. ) 50 g 3  . letrozole (FEMARA) 2.5 MG tablet Take 1 tablet (2.5 mg total) by mouth daily. 30 tablet 0  . levothyroxine (SYNTHROID, LEVOTHROID) 75 MCG tablet Take 1 tablet (75 mcg total) by mouth daily. 90 tablet 3  . Loratadine (CLARITIN) 10 MG CAPS Take by mouth.    . Magnesium 400 MG TABS Take 400 mg by mouth daily.    . meclizine (ANTIVERT) 25 MG tablet TAKE ONE TABLET BY MOUTH EVERY 4 TO 6 HOURS AS NEEDED FOR NAUSEA 30 tablet 3  . Misc Natural Products (TURMERIC CURCUMIN) CAPS Take 1 capsule by mouth daily.     No current facility-administered medications for this visit.    Facility-Administered Medications Ordered in Other Visits  Medication Dose Route Frequency Provider Last Rate Last Dose  . sodium chloride flush (NS) 0.9 % injection 10 mL  10 mL Intravenous PRN Lloyd Huger, MD   10 mL at 02/09/16 0930  . sodium chloride flush (NS) 0.9 % injection 10 mL  10 mL Intravenous PRN Lloyd Huger, MD   10 mL at 06/21/16 0950    Patient confirms/reports the following allergies:  Allergies  Allergen Reactions  . Penicillins Shortness Of Breath    Has patient had a PCN reaction causing immediate rash, facial/tongue/throat swelling, SOB or lightheadedness with hypotension: Yes Has patient had a PCN reaction causing severe rash involving mucus membranes or skin necrosis: No Has patient had a PCN reaction that required  hospitalization No Has patient had a PCN reaction occurring within the last 10 years: No If all of the above answers are "NO", then may proceed with Cephalosporin use.   . Nickel Hives  . Pravastatin Sodium Other (See Comments)    Myalgia     No orders of the defined types were placed in this encounter.   AUTHORIZATION INFORMATION Primary Insurance: 1D#: Group #:  Secondary Insurance: 1D#: Group #:  SCHEDULE INFORMATION: Date: 9/27 Time: Location: [place]

## 2017-08-25 ENCOUNTER — Encounter: Payer: Self-pay | Admitting: *Deleted

## 2017-08-31 NOTE — Discharge Instructions (Signed)
General Anesthesia, Adult, Care After °These instructions provide you with information about caring for yourself after your procedure. Your health care provider may also give you more specific instructions. Your treatment has been planned according to current medical practices, but problems sometimes occur. Call your health care provider if you have any problems or questions after your procedure. °What can I expect after the procedure? °After the procedure, it is common to have: °· Vomiting. °· A sore throat. °· Mental slowness. ° °It is common to feel: °· Nauseous. °· Cold or shivery. °· Sleepy. °· Tired. °· Sore or achy, even in parts of your body where you did not have surgery. ° °Follow these instructions at home: °For at least 24 hours after the procedure: °· Do not: °? Participate in activities where you could fall or become injured. °? Drive. °? Use heavy machinery. °? Drink alcohol. °? Take sleeping pills or medicines that cause drowsiness. °? Make important decisions or sign legal documents. °? Take care of children on your own. °· Rest. °Eating and drinking °· If you vomit, drink water, juice, or soup when you can drink without vomiting. °· Drink enough fluid to keep your urine clear or pale yellow. °· Make sure you have little or no nausea before eating solid foods. °· Follow the diet recommended by your health care provider. °General instructions °· Have a responsible adult stay with you until you are awake and alert. °· Return to your normal activities as told by your health care provider. Ask your health care provider what activities are safe for you. °· Take over-the-counter and prescription medicines only as told by your health care provider. °· If you smoke, do not smoke without supervision. °· Keep all follow-up visits as told by your health care provider. This is important. °Contact a health care provider if: °· You continue to have nausea or vomiting at home, and medicines are not helpful. °· You  cannot drink fluids or start eating again. °· You cannot urinate after 8-12 hours. °· You develop a skin rash. °· You have fever. °· You have increasing redness at the site of your procedure. °Get help right away if: °· You have difficulty breathing. °· You have chest pain. °· You have unexpected bleeding. °· You feel that you are having a life-threatening or urgent problem. °This information is not intended to replace advice given to you by your health care provider. Make sure you discuss any questions you have with your health care provider. °Document Released: 02/28/2001 Document Revised: 04/26/2016 Document Reviewed: 11/06/2015 °Elsevier Interactive Patient Education © 2018 Elsevier Inc. ° °

## 2017-09-01 ENCOUNTER — Ambulatory Visit: Payer: Medicare Other | Admitting: Anesthesiology

## 2017-09-01 ENCOUNTER — Encounter
Admission: RE | Disposition: A | Discharge status: Home / Self Care | Payer: Self-pay | Source: Ambulatory Visit | Attending: Gastroenterology

## 2017-09-01 ENCOUNTER — Ambulatory Visit
Admission: RE | Admit: 2017-09-01 | Discharge: 2017-09-01 | Disposition: A | Discharge status: Home / Self Care | Payer: Medicare Other | Source: Ambulatory Visit | Attending: Gastroenterology | Admitting: Gastroenterology

## 2017-09-01 DIAGNOSIS — Z791 Long term (current) use of non-steroidal anti-inflammatories (NSAID): Secondary | ICD-10-CM | POA: Insufficient documentation

## 2017-09-01 DIAGNOSIS — I10 Essential (primary) hypertension: Secondary | ICD-10-CM | POA: Diagnosis not present

## 2017-09-01 DIAGNOSIS — Z79899 Other long term (current) drug therapy: Secondary | ICD-10-CM | POA: Diagnosis not present

## 2017-09-01 DIAGNOSIS — K219 Gastro-esophageal reflux disease without esophagitis: Secondary | ICD-10-CM | POA: Diagnosis not present

## 2017-09-01 DIAGNOSIS — E785 Hyperlipidemia, unspecified: Secondary | ICD-10-CM | POA: Diagnosis not present

## 2017-09-01 DIAGNOSIS — Z9109 Other allergy status, other than to drugs and biological substances: Secondary | ICD-10-CM | POA: Diagnosis not present

## 2017-09-01 DIAGNOSIS — K640 First degree hemorrhoids: Secondary | ICD-10-CM | POA: Insufficient documentation

## 2017-09-01 DIAGNOSIS — E039 Hypothyroidism, unspecified: Secondary | ICD-10-CM | POA: Diagnosis not present

## 2017-09-01 DIAGNOSIS — K5730 Diverticulosis of large intestine without perforation or abscess without bleeding: Secondary | ICD-10-CM | POA: Diagnosis not present

## 2017-09-01 DIAGNOSIS — Z880 Allergy status to penicillin: Secondary | ICD-10-CM | POA: Diagnosis not present

## 2017-09-01 DIAGNOSIS — Z1211 Encounter for screening for malignant neoplasm of colon: Secondary | ICD-10-CM

## 2017-09-01 HISTORY — DX: Unspecified mononeuropathy of unspecified lower limb: G57.90

## 2017-09-01 HISTORY — DX: Anemia, unspecified: D64.9

## 2017-09-01 HISTORY — DX: Presence of dental prosthetic device (complete) (partial): Z97.2

## 2017-09-01 HISTORY — DX: Dizziness and giddiness: R42

## 2017-09-01 HISTORY — DX: Contact with and (suspected) exposure to tuberculosis: Z20.1

## 2017-09-01 HISTORY — PX: COLONOSCOPY WITH PROPOFOL: SHX5780

## 2017-09-01 SURGERY — COLONOSCOPY WITH PROPOFOL
Anesthesia: General

## 2017-09-01 MED ORDER — PROPOFOL 10 MG/ML IV BOLUS
INTRAVENOUS | Status: DC | PRN
  Administered 2017-09-01: 50 mg via INTRAVENOUS
  Administered 2017-09-01: 30 mg via INTRAVENOUS
  Administered 2017-09-01: 50 mg via INTRAVENOUS
  Administered 2017-09-01: 80 mg via INTRAVENOUS
  Administered 2017-09-01: 50 mg via INTRAVENOUS
  Administered 2017-09-01: 40 mg via INTRAVENOUS

## 2017-09-01 MED ORDER — LACTATED RINGERS IV SOLN
INTRAVENOUS | Status: DC
  Administered 2017-09-01: 09:00:00 via INTRAVENOUS

## 2017-09-01 MED ORDER — STERILE WATER FOR IRRIGATION IR SOLN
Status: DC | PRN
  Administered 2017-09-01: 10:00:00

## 2017-09-01 MED ORDER — LIDOCAINE HCL (CARDIAC) 20 MG/ML IV SOLN
INTRAVENOUS | Status: DC | PRN
  Administered 2017-09-01: 40 mg via INTRAVENOUS

## 2017-09-01 SURGICAL SUPPLY — 23 items
CANISTER SUCT 1200ML W/VALVE (MISCELLANEOUS) ×3 IMPLANT
CLIP HMST 235XBRD CATH ROT (MISCELLANEOUS) IMPLANT
CLIP RESOLUTION 360 11X235 (MISCELLANEOUS)
FCP ESCP3.2XJMB 240X2.8X (MISCELLANEOUS)
FORCEPS BIOP RAD 4 LRG CAP 4 (CUTTING FORCEPS) IMPLANT
FORCEPS BIOP RJ4 240 W/NDL (MISCELLANEOUS)
FORCEPS ESCP3.2XJMB 240X2.8X (MISCELLANEOUS) IMPLANT
GOWN CVR UNV OPN BCK APRN NK (MISCELLANEOUS) ×2 IMPLANT
GOWN ISOL THUMB LOOP REG UNIV (MISCELLANEOUS) ×6
INJECTOR VARIJECT VIN23 (MISCELLANEOUS) IMPLANT
KIT DEFENDO VALVE AND CONN (KITS) IMPLANT
KIT ENDO PROCEDURE OLY (KITS) ×3 IMPLANT
MARKER SPOT ENDO TATTOO 5ML (MISCELLANEOUS) IMPLANT
PAD GROUND ADULT SPLIT (MISCELLANEOUS) IMPLANT
PROBE APC STR FIRE (PROBE) IMPLANT
RETRIEVER NET ROTH 2.5X230 LF (MISCELLANEOUS) IMPLANT
SNARE SHORT THROW 13M SML OVAL (MISCELLANEOUS) IMPLANT
SNARE SHORT THROW 30M LRG OVAL (MISCELLANEOUS) IMPLANT
SNARE SNG USE RND 15MM (INSTRUMENTS) IMPLANT
SPOT EX ENDOSCOPIC TATTOO (MISCELLANEOUS)
TRAP ETRAP POLY (MISCELLANEOUS) IMPLANT
VARIJECT INJECTOR VIN23 (MISCELLANEOUS)
WATER STERILE IRR 250ML POUR (IV SOLUTION) ×3 IMPLANT

## 2017-09-01 NOTE — H&P (Signed)
Kaylee Lame, MD [address] [place], [place] [address] Phone: [phone] Fax : [phone]  Primary Care Physician:  Birdie Sons, MD Primary Gastroenterologist:  Dr. Allen Norris  Pre-Procedure History & Physical: HPI:  Kaylee Reyes is a 66 y.o. female is here for a screening colonoscopy.   Past Medical History:  Diagnosis Date  . Anemia   . Cancer ([place]) 09/2015   breast cancer  . GERD (gastroesophageal reflux disease)   . History of chicken pox   . Hyperlipidemia   . Hypertension   . Hypothyroidism   . Neuropathy of foot    bilateral  . PONV (postoperative nausea and vomiting)    expl[facility] surgery at [facility]  . Thyroid disease   . Tuberculosis exposure    Mother had TB.  Pt shows (+) on tests, but has never had.  . Vertigo    last episode over 1 yr ago  . Wears dentures    partial upper and lower    Past Surgical History:  Procedure Laterality Date  . ABDOMINAL HYSTERECTOMY  07/13/2013   Total. with BSO for postmenopausal bleeding and fibroids with large cervical polyp. Dr. Levy Sjogren and Dr. Glennon Mac at [facility]  . ABDOMINAL ULTRASOUND  04/17/2011   Hepatomegaly with borderline slenomegaly. Suggestive of fatty liver. s/p cholecystectomy. Portions of aorta obscured  . BREAST SURGERY Right 2011   BREAST BIOPSY  . CESAREAN SECTION  1988  . CHOLECYSTECTOMY  1990  . EXPLORATORY LAPAROTOMY     Left breast  . MASTECTOMY W/ SENTINEL NODE BIOPSY Bilateral 10/28/2015   Procedure: MASTECTOMY WITH SENTINEL LYMPH NODE BIOPSY;  Surgeon: Hubbard Robinson, MD;  Location: [facility] ORS;  Service: General;  Laterality: Bilateral;  . PORT-A-CATH REMOVAL Left 10/11/2016   Procedure: REMOVAL PORT-A-CATH;  Surgeon: Hubbard Robinson, MD;  Location: [facility] ORS;  Service: General;  Laterality: Left;  . PORTACATH PLACEMENT Left 12/23/2015   Procedure: INSERTION PORT-A-CATH;  Surgeon: Hubbard Robinson, MD;  Location: [facility] ORS;  Service: General;  Laterality: Left;  . WRIST  SURGERY      Prior to Admission medications   Medication Sig Start Date End Date Taking? Authorizing Provider  atenolol (TENORMIN) 50 MG tablet Take 1 tablet (50 mg total) by mouth daily. 07/20/17  Yes Birdie Sons, MD  B Complex-C (SUPER B COMPLEX PO) Take 1 tablet by mouth daily.   Yes [provider]  calcium carbonate (OS-CAL) 600 MG TABS tablet Take 1 tablet (600 mg total) by mouth 2 (two) times daily with a meal. 12/15/16  Yes Lloyd Huger, MD  Cholecalciferol (VITAMIN D3) 2000 units TABS Take 2,000 Units by mouth daily.   Yes [provider]  cyclobenzaprine (FLEXERIL) 5 MG tablet TAKE 1 TABLET(5 MG) BY MOUTH THREE TIMES DAILY AS NEEDED FOR MUSCLE SPASMS 07/27/17  Yes Birdie Sons, MD  docusate sodium (COLACE) 100 MG capsule Take 200 mg by mouth daily.    Yes [provider]  hydrochlorothiazide (HYDRODIURIL) 25 MG tablet Take 1 tablet (25 mg total) by mouth daily. 07/20/17  Yes Birdie Sons, MD  Hydrocortisone Micronized POWD RINSE AND SPIT 5ML BY MOUTH EVERY 3 TO 4 HOURS AS NEEDED 06/22/17  Yes Birdie Sons, MD  ibuprofen (ADVIL,MOTRIN) 200 MG tablet Take 400 mg by mouth every 6 (six) hours as needed for mild pain.   Yes [provider]  ketoconazole (NIZORAL) 2 % cream Apply 1 application topically daily. Patient taking differently: Apply 1 application  topically daily as needed.  10/29/16  Yes Birdie Sons, MD  letrozole [facility]) 2.5 MG tablet Take 1 tablet (2.5 mg total) by mouth daily. 07/22/17  Yes Birdie Sons, MD  levothyroxine (SYNTHROID, LEVOTHROID) 75 MCG tablet Take 1 tablet (75 mcg total) by mouth daily. 07/22/17  Yes Birdie Sons, MD  Loratadine (CLARITIN) 10 MG CAPS Take by mouth.   Yes [provider]  Magnesium 400 MG TABS Take 400 mg by mouth daily.   Yes [provider]  meclizine (ANTIVERT) 25 MG tablet TAKE ONE TABLET BY MOUTH EVERY 4 TO 6 HOURS AS NEEDED FOR NAUSEA 05/25/16  Yes Fisher,  Kirstie Peri, MD  Misc Natural Products (TURMERIC CURCUMIN) CAPS Take 1 capsule by mouth daily.   Yes [provider]    Allergies as of 08/19/2017 - Review Complete 07/22/2017  Allergen Reaction Noted  . Penicillins Shortness Of Breath 08/20/2015  . Nickel Hives 10/15/2015  . Pravastatin sodium Other (See Comments) 08/20/2015    Family History  Problem Relation Age of Onset  . Hypertension Mother   . Thyroid disease Mother   . Hypertension Brother   . Diabetes Brother   . Breast cancer Neg Hx   . Ovarian cancer Neg Hx   . Colon cancer Neg Hx     Social History   Social History  . Marital status: Married    Spouse name: N/A  . Number of children: 2  . Years of education: N/A   Occupational History  . Not on file.   Social History Main Topics  . Smoking status: Never Smoker  . Smokeless tobacco: Never Used  . Alcohol use No  . Drug use: No  . Sexual activity: No   Other Topics Concern  . Not on file   Social History Narrative  . No narrative on file    Review of Systems: See HPI, otherwise negative ROS  Physical Exam: BP (!) 166/76   Pulse 76   Temp 98.1 F (36.7 C) (Temporal)   Resp 16   Ht 5\' 6"  (1.676 m)   Wt 255 lb (115.7 kg)   SpO2 98%   BMI 41.16 kg/m  General:   Alert,  pleasant and cooperative in NAD Head:  Normocephalic and atraumatic. Neck:  Supple; no masses or thyromegaly. Lungs:  Clear throughout to auscultation.    Heart:  Regular rate and rhythm. Abdomen:  Soft, nontender and nondistended. Normal bowel sounds, without guarding, and without rebound.   Neurologic:  Alert and  oriented x4;  grossly normal neurologically.  Impression/Plan: Kaylee Reyes is now here to undergo a screening colonoscopy.  Risks, benefits, and alternatives regarding colonoscopy have been reviewed with the patient.  Questions have been answered.  All parties agreeable.

## 2017-09-01 NOTE — Op Note (Signed)
[facility] Patient Name: Kaylee Reyes Procedure Date: 09/01/2017 9:47 AM MRN: [id number] Account #: [id number] Date of Birth: Jul 18, 1951 Admit Type: Outpatient Age: 66 Room: [facility] OR ROOM 01 Gender: Female Note Status: Finalized Procedure:            Colonoscopy Indications:          Screening for colorectal malignant neoplasm Providers:            [name] MD, MD Referring MD:         Kirstie Peri. Caryn Section, MD (Referring MD) Medicines:            Propofol per Anesthesia Complications:        No immediate complications. Procedure:            Pre-Anesthesia Assessment:                       - Prior to the procedure, a History and Physical was                        performed, and patient medications and allergies were                        reviewed. The patient's tolerance of previous                        anesthesia was also reviewed. The risks and benefits of                        the procedure and the sedation options and risks were                        discussed with the patient. All questions were                        answered, and informed consent was obtained. Prior                        Anticoagulants: The patient has taken no previous                        anticoagulant or antiplatelet agents. ASA Grade                        Assessment: II - A patient with mild systemic disease.                        After reviewing the risks and benefits, the patient was                        deemed in satisfactory condition to undergo the                        procedure.                       After obtaining informed consent, the colonoscope was                        passed under direct vision. Throughout the procedure,  the patient's blood pressure, pulse, and oxygen                        saturations were monitored continuously. The Olympus CF                        H180AL Colonoscope (S#: [id number]) was introduced  through                        the anus and advanced to the the cecum, identified by                        appendiceal orifice and ileocecal valve. The                        colonoscopy was performed without difficulty. The                        patient tolerated the procedure well. The quality of                        the bowel preparation was excellent. Findings:      The perianal and digital rectal examinations were normal.      Multiple small-mouthed diverticula were found in the sigmoid colon.      Non-bleeding internal hemorrhoids were found during retroflexion. The       hemorrhoids were Grade I (internal hemorrhoids that do not prolapse). Impression:           - Diverticulosis in the sigmoid colon.                       - Non-bleeding internal hemorrhoids.                       - No specimens collected. Recommendation:       - Discharge patient to home.                       - Resume previous diet.                       - Continue present medications.                       - Repeat colonoscopy in 10 years for screening unless                        any change in family history or lower GI problems. Procedure Code(s):    --- Professional ---                       [phone], Colonoscopy, flexible; diagnostic, including                        collection of specimen(s) by brushing or washing, when                        performed (separate procedure) Diagnosis Code(s):    --- Professional ---                       Z12.11, Encounter for screening for malignant neoplasm  of colon CPT copyright 2016 American Medical Association. All rights reserved. The codes documented in this report are preliminary and upon coder review may  be revised to meet current compliance requirements. [name] MD, MD 09/01/2017 10:19:27 AM This report has been signed electronically. Number of Addenda: 0 Note Initiated On: 09/01/2017 9:47 AM Scope Withdrawal Time: 0 hours 6 minutes  33 seconds  Total Procedure Duration: 0 hours 14 minutes 9 seconds       [facility]

## 2017-09-01 NOTE — Anesthesia Postprocedure Evaluation (Signed)
Anesthesia Post Note  Patient: Kaylee Reyes  Procedure(s) Performed: Procedure(s) (LRB): COLONOSCOPY WITH PROPOFOL (N/A)  Patient location during evaluation: PACU Anesthesia Type: General Level of consciousness: awake and alert Pain management: pain level controlled Vital Signs Assessment: post-procedure vital signs reviewed and stable Respiratory status: spontaneous breathing Cardiovascular status: blood pressure returned to baseline Postop Assessment: no headache Anesthetic complications: no    Jaci Standard, III,  Rally Ouch D

## 2017-09-01 NOTE — Transfer of Care (Signed)
Immediate Anesthesia Transfer of Care Note  Patient: Kaylee Reyes  Procedure(s) Performed: Procedure(s): COLONOSCOPY WITH PROPOFOL (N/A)  Patient Location: PACU  Anesthesia Type: General  Level of Consciousness: awake, alert  and patient cooperative  Airway and Oxygen Therapy: Patient Spontanous Breathing and Patient connected to supplemental oxygen  Post-op Assessment: Post-op Vital signs reviewed, Patient's Cardiovascular Status Stable, Respiratory Function Stable, Patent Airway and No signs of Nausea or vomiting  Post-op Vital Signs: Reviewed and stable  Complications: No apparent anesthesia complications

## 2017-09-01 NOTE — Anesthesia Procedure Notes (Signed)
Procedure Name: MAC Date/Time: 09/01/2017 9:57 AM Performed by: Janna Arch Pre-anesthesia Checklist: Patient identified, Emergency Drugs available, Suction available and Patient being monitored Patient Re-evaluated:Patient Re-evaluated prior to induction Oxygen Delivery Method: Nasal cannula

## 2017-09-01 NOTE — Anesthesia Preprocedure Evaluation (Signed)
Anesthesia Evaluation  Patient identified by MRN, date of birth, ID band Patient awake    Reviewed: Allergy & Precautions, H&P , NPO status , Patient's Chart, lab work & pertinent test results  History of Anesthesia Complications (+) PONV and history of anesthetic complications  Airway Mallampati: II  TM Distance: >3 FB Neck ROM: full    Dental no notable dental hx.    Pulmonary neg pulmonary ROS,    Pulmonary exam normal        Cardiovascular hypertension, On Medications Normal cardiovascular exam     Neuro/Psych    GI/Hepatic Neg liver ROS, Medicated,  Endo/Other  Hypothyroidism   Renal/GU negative Renal ROS     Musculoskeletal   Abdominal   Peds  Hematology   Anesthesia Other Findings   Reproductive/Obstetrics                            Anesthesia Physical Anesthesia Plan  ASA: II  Anesthesia Plan: General   Post-op Pain Management:    Induction:   PONV Risk Score and Plan:   Airway Management Planned:   Additional Equipment:   Intra-op Plan:   Post-operative Plan:   Informed Consent: I have reviewed the patients History and Physical, chart, labs and discussed the procedure including the risks, benefits and alternatives for the proposed anesthesia with the patient or authorized representative who has indicated his/her understanding and acceptance.     Plan Discussed with:   Anesthesia Plan Comments:         Anesthesia Quick Evaluation

## 2017-09-02 ENCOUNTER — Encounter: Payer: Self-pay | Admitting: Gastroenterology

## 2017-09-18 NOTE — Progress Notes (Signed)
[place]  [phone] [phone]) [phone]  ID: Kaylee Reyes OB: 1951/04/28  MR#: [id number]  LEX#:[id number]  Patient Care Team: Birdie Sons, MD as PCP - General (Family Medicine) [name], MD as Consulting Physician (Gastroenterology) Hubbard Robinson, MD as Consulting Physician (Surgery)  CHIEF COMPLAINT: Pathologic stage IIB ER/PR positive invasive lobular carcinoma of the right upper breast.  INTERVAL HISTORY: Patient returns to clinic today for routine 6 month evaluation. She continues to feel well and is asymptomatic. She is tolerating letrozole with only mild bilateral knee pain and hot flashes which do not affect her day-to-day activity. She continues to have a mild peripheral neuropathy is mildly improved. She has no other neurologic complaints. She denies any recent fevers or illnesses. She denies any other pain. She has a good appetite and denies weight loss. She denies any chest pain. She has no nausea, vomiting, constipation, or diarrhea. She has no urinary complaints. Patient offers no further specific complaints today.  REVIEW OF SYSTEMS:   Review of Systems  Constitutional: Negative for fever, malaise/fatigue and weight loss.  Respiratory: Negative for cough and shortness of breath.   Cardiovascular: Negative.  Negative for chest pain and leg swelling.  Gastrointestinal: Negative.  Negative for abdominal pain.  Genitourinary: Negative.   Musculoskeletal: Positive for back pain and joint pain.  Skin: Negative.  Negative for rash.  Neurological: Positive for sensory change. Negative for weakness.  Psychiatric/Behavioral: Negative.  Negative for memory loss. The patient is not nervous/anxious.     As per HPI. Otherwise, a complete review of systems is negative.  PAST MEDICAL HISTORY: Past Medical History:  Diagnosis Date  . Anemia   . Cancer ([place]) 09/2015   breast cancer  . GERD (gastroesophageal reflux disease)   .  History of chicken pox   . Hyperlipidemia   . Hypertension   . Hypothyroidism   . Neuropathy of foot    bilateral  . PONV (postoperative nausea and vomiting)    exploratory surgery at [facility]  . Thyroid disease   . Tuberculosis exposure    Mother had TB.  Pt shows (+) on tests, but has never had.  . Vertigo    last episode over 1 yr ago  . Wears dentures    partial upper and lower    PAST SURGICAL HISTORY: Past Surgical History:  Procedure Laterality Date  . ABDOMINAL HYSTERECTOMY  07/13/2013   Total. with BSO for postmenopausal bleeding and fibroids with large cervical polyp. Dr. Levy Sjogren and Dr. Glennon Mac at [facility]  . ABDOMINAL ULTRASOUND  04/17/2011   Hepatomegaly with borderline slenomegaly. Suggestive of fatty liver. s/p cholecystectomy. Portions of aorta obscured  . BREAST SURGERY Right 2011   BREAST BIOPSY  . CESAREAN SECTION  1988  . CHOLECYSTECTOMY  1990  . COLONOSCOPY WITH PROPOFOL N/A 09/01/2017   Procedure: COLONOSCOPY WITH PROPOFOL;  Surgeon: [name], MD;  Location: [place];  Service: Gastroenterology;  Laterality: N/A;  . EXPLORATORY LAPAROTOMY     Left breast  . MASTECTOMY W/ SENTINEL NODE BIOPSY Bilateral 10/28/2015   Procedure: MASTECTOMY WITH SENTINEL LYMPH NODE BIOPSY;  Surgeon: Hubbard Robinson, MD;  Location: [facility];  Service: General;  Laterality: Bilateral;  . PORT-A-CATH REMOVAL Left 10/11/2016   Procedure: REMOVAL PORT-A-CATH;  Surgeon: Hubbard Robinson, MD;  Location: [facility];  Service: General;  Laterality: Left;  . PORTACATH PLACEMENT Left 12/23/2015   Procedure: INSERTION PORT-A-CATH;  Surgeon: Hubbard Robinson, MD;  Location: [facility];  Service: General;  Laterality: Left;  . WRIST SURGERY      FAMILY HISTORY Family History  Problem Relation Age of Onset  . Hypertension Mother   . Thyroid disease Mother   . Hypertension Brother   . Diabetes Brother   . Breast cancer Neg Hx   . Ovarian cancer Neg Hx   . Colon cancer Neg Hx          ADVANCED DIRECTIVES:    HEALTH MAINTENANCE: Social History  Substance Use Topics  . Smoking status: Never Smoker  . Smokeless tobacco: Never Used  . Alcohol use No     Colonoscopy:  PAP:  Bone density:  Lipid panel:  Allergies  Allergen Reactions  . Penicillins Shortness Of Breath    Has patient had a PCN reaction causing immediate rash, facial/tongue/throat swelling, SOB or lightheadedness with hypotension: Yes Has patient had a PCN reaction causing severe rash involving mucus membranes or skin necrosis: No Has patient had a PCN reaction that required hospitalization No Has patient had a PCN reaction occurring within the last 10 years: No If all of the above answers are "NO", then may proceed with Cephalosporin use.   . Nickel Hives  . Pravastatin Sodium Other (See Comments)    Myalgia     Current Outpatient Prescriptions  Medication Sig Dispense Refill  . atenolol (TENORMIN) 50 MG tablet Take 1 tablet (50 mg total) by mouth daily. 90 tablet 4  . B Complex-C (SUPER B COMPLEX PO) Take 1 tablet by mouth daily.    . calcium carbonate (OS-CAL) 600 MG TABS tablet Take 1 tablet (600 mg total) by mouth 2 (two) times daily with a meal. 60 tablet 5  . Cholecalciferol (VITAMIN D3) 2000 units TABS Take 2,000 Units by mouth daily.    . cyclobenzaprine (FLEXERIL) 5 MG tablet TAKE 1 TABLET(5 MG) BY MOUTH THREE TIMES DAILY AS NEEDED FOR MUSCLE SPASMS 30 tablet 5  . docusate sodium (COLACE) 100 MG capsule Take 200 mg by mouth daily.     . hydrochlorothiazide (HYDRODIURIL) 25 MG tablet Take 1 tablet (25 mg total) by mouth daily. 90 tablet 4  . Hydrocortisone Micronized POWD RINSE AND SPIT 5ML BY MOUTH EVERY 3 TO 4 HOURS AS NEEDED 1 Bottle 4  . ibuprofen (ADVIL,MOTRIN) 200 MG tablet Take 400 mg by mouth every 6 (six) hours as needed for mild pain.    Marland Kitchen ketoconazole (NIZORAL) 2 % cream Apply 1 application topically daily. (Patient taking differently: Apply 1 application topically  daily as needed. ) 50 g 3  . letrozole (FEMARA) 2.5 MG tablet Take 1 tablet (2.5 mg total) by mouth daily. 30 tablet 0  . levothyroxine (SYNTHROID, LEVOTHROID) 75 MCG tablet Take 1 tablet (75 mcg total) by mouth daily. 90 tablet 3  . Loratadine (CLARITIN) 10 MG CAPS Take by mouth.    . Magnesium 400 MG TABS Take 400 mg by mouth daily.    . meclizine (ANTIVERT) 25 MG tablet TAKE ONE TABLET BY MOUTH EVERY 4 TO 6 HOURS AS NEEDED FOR NAUSEA 30 tablet 3  . Misc Natural Products (TURMERIC CURCUMIN) CAPS Take 1 capsule by mouth daily.     No current facility-administered medications for this visit.    Facility-Administered Medications Ordered in Other Visits  Medication Dose Route Frequency Provider Last Rate Last Dose  . sodium chloride flush (NS) 0.9 % injection 10 mL  10 mL Intravenous PRN Lloyd Huger, MD   10 mL at 02/09/16 0930  .  sodium chloride flush (NS) 0.9 % injection 10 mL  10 mL Intravenous PRN Lloyd Huger, MD   10 mL at 06/21/16 0950    OBJECTIVE: Vitals:   09/19/17 1438  BP: 140/89  Pulse: 61  Temp: 97.9 F (36.6 C)     Body mass index is 41.27 kg/m.    ECOG FS:0 - Asymptomatic  General: Well-developed, well-nourished, no acute distress. Eyes: Pink conjunctiva, anicteric sclera. Breasts: Patient requested exam be deferred today. Lungs: Clear to auscultation bilaterally. Heart: Regular rate and rhythm. No rubs, murmurs, or gallops. Abdomen: Soft, nontender, nondistended. No organomegaly noted, normoactive bowel sounds. Musculoskeletal: No edema, cyanosis, or clubbing. Neuro: Alert, answering all questions appropriately. Cranial nerves grossly intact. Skin: No rashes or petechiae noted. Psych: Normal affect.   LAB RESULTS:  Lab Results  Component Value Date   NA 143 04/04/2017   K 4.6 04/04/2017   CL 99 04/04/2017   CO2 27 04/04/2017   GLUCOSE 111 (H) 04/04/2017   BUN 12 04/04/2017   CREATININE 0.84 04/04/2017   CALCIUM 9.9 04/04/2017   PROT 7.6  04/04/2017   ALBUMIN 4.2 04/04/2017   AST 27 04/04/2017   ALT 29 04/04/2017   ALKPHOS 108 04/04/2017   BILITOT 0.4 04/04/2017   GFRNONAA 74 04/04/2017   GFRAA 85 04/04/2017    Lab Results  Component Value Date   WBC 8.1 04/04/2017   NEUTROABS 6.4 04/04/2017   HGB 11.6 04/04/2017   HCT 34.1 04/04/2017   MCV 83 04/04/2017   PLT 219 04/04/2017   Lab Results  Component Value Date   IRON 48 02/09/2016   TIBC 326 02/09/2016   IRONPCTSAT 15 02/09/2016     STUDIES: No results found.  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  PLAN:    1. Pathologic stage IIB ER/PR positive invasive lobular carcinoma of the right upper breast: Patient is now status post bilateral mastectomy, therefore does not require additional mammograms. She completed 4 cycles of Taxotere and Cytoxan on March 03, 2016. She has now completed adjuvant XRT. Patient was offered the PALLAS clinical trial, but ultimately refused therefore she will take letrozole alone for total 5 years completing in July 2022.  Baseline bone mineral density on July 29, 2016 reported a T score of -0.8 which is considered normal. Repeat in August 2019. Return to clinic in 6 months for further evaluation. Patient will require breast exam at her next visit. 2. Leukopenia: Resolved.  3. Anemia: Resolved. 4. Hot flashes: Secondary to letrozole. Continue treatment as prescribed.  Approximately 20 minutes was spent in discussion of which greater than 50% was consultation.   Patient expressed understanding and was in agreement with this plan. She also understands that She can call clinic at any time with any questions, concerns, or complaints.   Invasive lobular carcinoma of right breast, stage 2 ([place])   Staging form: Breast, AJCC 7th Edition     Pathologic stage: Stage IIB (T2, N1a, M0) - Signed by Lloyd Huger, MD on 10/22/2015   Lloyd Huger, MD   09/20/2017 9:03  AM

## 2017-09-19 ENCOUNTER — Encounter: Payer: Self-pay | Admitting: Oncology

## 2017-09-19 ENCOUNTER — Inpatient Hospital Stay: Payer: Medicare Other | Attending: Oncology | Admitting: Oncology

## 2017-09-19 VITALS — BP 140/89 | HR 61 | Temp 97.9°F | Wt 255.7 lb

## 2017-09-19 DIAGNOSIS — Z79810 Long term (current) use of selective estrogen receptor modulators (SERMs): Secondary | ICD-10-CM | POA: Insufficient documentation

## 2017-09-19 DIAGNOSIS — Z9013 Acquired absence of bilateral breasts and nipples: Secondary | ICD-10-CM | POA: Diagnosis not present

## 2017-09-19 DIAGNOSIS — Z9221 Personal history of antineoplastic chemotherapy: Secondary | ICD-10-CM | POA: Insufficient documentation

## 2017-09-19 DIAGNOSIS — Z170 Estrogen receptor positive status [ER+]: Secondary | ICD-10-CM

## 2017-09-19 DIAGNOSIS — Z923 Personal history of irradiation: Secondary | ICD-10-CM | POA: Diagnosis not present

## 2017-09-19 DIAGNOSIS — C50911 Malignant neoplasm of unspecified site of right female breast: Secondary | ICD-10-CM | POA: Insufficient documentation

## 2017-09-19 DIAGNOSIS — I10 Essential (primary) hypertension: Secondary | ICD-10-CM | POA: Insufficient documentation

## 2017-11-07 ENCOUNTER — Ambulatory Visit: Payer: Self-pay | Admitting: Surgery

## 2017-11-17 ENCOUNTER — Ambulatory Visit (INDEPENDENT_AMBULATORY_CARE_PROVIDER_SITE_OTHER): Payer: Medicare Other | Admitting: Physician Assistant

## 2017-11-17 ENCOUNTER — Encounter: Payer: Self-pay | Admitting: Physician Assistant

## 2017-11-17 VITALS — BP 138/86 | HR 64 | Temp 98.2°F | Resp 16 | Wt 246.0 lb

## 2017-11-17 DIAGNOSIS — K6289 Other specified diseases of anus and rectum: Secondary | ICD-10-CM

## 2017-11-17 DIAGNOSIS — K625 Hemorrhage of anus and rectum: Secondary | ICD-10-CM | POA: Diagnosis not present

## 2017-11-17 MED ORDER — HYDROCORTISONE 2.5 % RE CREA: 1.0000 "application " | TOPICAL_CREAM | Freq: Two times a day (BID) | RECTAL | 0 refills | Status: DC

## 2017-11-17 NOTE — Patient Instructions (Signed)
Hemorrhoids    Hemorrhoids are swollen veins in and around the rectum or anus. Hemorrhoids can cause pain, itching, or bleeding. Most of the time, they do not cause serious problems. They usually get better with diet changes, lifestyle changes, and other home treatments.  Follow these instructions at home:  Eating and drinking  · Eat foods that have fiber, such as whole grains, beans, nuts, fruits, and vegetables. Ask your doctor about taking products that have added fiber (fiber supplements).  · Drink enough fluid to keep your pee (urine) clear or pale yellow.  For Pain and Swelling  · Take a warm-water bath (sitz bath) for 20 minutes to ease pain. Do this 3-4 times a day.  · If directed, put ice on the painful area. It may be helpful to use ice between your warm baths.  ¨ Put ice in a plastic bag.  ¨ Place a towel between your skin and the bag.  ¨ Leave the ice on for 20 minutes, 2-3 times a day.  General instructions  · Take over-the-counter and prescription medicines only as told by your doctor.  ¨ Medicated creams and medicines that are inserted into the anus (suppositories) may be used or applied as told.  · Exercise often.  · Go to the bathroom when you have the urge to poop (to have a bowel movement). Do not wait.  · Avoid pushing too hard (straining) when you poop.  · Keep the butt area dry and clean. Use wet toilet paper or moist paper towels.  · Do not sit on the toilet for a long time.  Contact a doctor if:  · You have any of these:  ¨ Pain and swelling that do not get better with treatment or medicine.  ¨ Bleeding that will not stop.  ¨ Trouble pooping or you cannot poop.  ¨ Pain or swelling outside the area of the hemorrhoids.  This information is not intended to replace advice given to you by your health care provider. Make sure you discuss any questions you have with your health care provider.  Document Released: 08/31/2008 Document Revised: 04/29/2016 Document Reviewed: 08/06/2015  Elsevier  Interactive Patient Education © 2018 [employer]   

## 2017-11-17 NOTE — Progress Notes (Signed)
Patient: Kaylee Reyes Female    DOB: Oct 19, 1951   66 y.o.   MRN: [id number] Visit Date: 11/17/2017  Today's Provider: Trinna Post, PA-C   Chief Complaint  Patient presents with  . Hemorrhoids   Subjective:    HPI   Kaylee Reyes is a 66 y/o woman presenting today with one month of rectal pain and BRB per rectum. She had a colonoscopy one month ago with no polyps and some internal hemorrhoids. She says she has noticed some BRB on toilet tissues after bowel movements. She has been alternating constipation and diarrhea. She says she has a sharp, tearing feeling when she has a bowel movement.  Hemorrhoids: Patient complains of a recent flare of hemorrhoids.  (At least a month). Onset of symptoms was gradual starting 1 month ago ago with unchanged course since that time.  Started around the time of her recent colonoscopy.    She describes symptoms as anorectal itching and bleeding which only occurs with bowel movements. Treatment to date has been stool softeners: somewhat effective. Patient denies family hx of colorectal CA.     Allergies  Allergen Reactions  . Penicillins Shortness Of Breath    Has patient had a PCN reaction causing immediate rash, facial/tongue/throat swelling, SOB or lightheadedness with hypotension: Yes Has patient had a PCN reaction causing severe rash involving mucus membranes or skin necrosis: No Has patient had a PCN reaction that required hospitalization No Has patient had a PCN reaction occurring within the last 10 years: No If all of the above answers are "NO", then may proceed with Cephalosporin use.   . Nickel Hives  . Pravastatin Sodium Other (See Comments)    Myalgia      Current Outpatient Medications:  .  atenolol (TENORMIN) 50 MG tablet, Take 1 tablet (50 mg total) by mouth daily., Disp: 90 tablet, Rfl: 4 .  B Complex-C (SUPER B COMPLEX PO), Take 1 tablet by mouth daily., Disp: , Rfl:  .  calcium carbonate (OS-CAL) 600 MG TABS  tablet, Take 1 tablet (600 mg total) by mouth 2 (two) times daily with a meal., Disp: 60 tablet, Rfl: 5 .  Cholecalciferol (VITAMIN D3) 2000 units TABS, Take 2,000 Units by mouth daily., Disp: , Rfl:  .  cyclobenzaprine (FLEXERIL) 5 MG tablet, TAKE 1 TABLET(5 MG) BY MOUTH THREE TIMES DAILY AS NEEDED FOR MUSCLE SPASMS, Disp: 30 tablet, Rfl: 5 .  docusate sodium (COLACE) 100 MG capsule, Take 200 mg by mouth daily. , Disp: , Rfl:  .  hydrochlorothiazide (HYDRODIURIL) 25 MG tablet, Take 1 tablet (25 mg total) by mouth daily., Disp: 90 tablet, Rfl: 4 .  Hydrocortisone Micronized POWD, RINSE AND SPIT 5ML BY MOUTH EVERY 3 TO 4 HOURS AS NEEDED, Disp: 1 Bottle, Rfl: 4 .  ibuprofen (ADVIL,MOTRIN) 200 MG tablet, Take 400 mg by mouth every 6 (six) hours as needed for mild pain., Disp: , Rfl:  .  ketoconazole (NIZORAL) 2 % cream, Apply 1 application topically daily. (Patient taking differently: Apply 1 application topically daily as needed. ), Disp: 50 g, Rfl: 3 .  letrozole (FEMARA) 2.5 MG tablet, Take 1 tablet (2.5 mg total) by mouth daily., Disp: 30 tablet, Rfl: 0 .  levothyroxine (SYNTHROID, LEVOTHROID) 75 MCG tablet, Take 1 tablet (75 mcg total) by mouth daily., Disp: 90 tablet, Rfl: 3 .  Loratadine (CLARITIN) 10 MG CAPS, Take by mouth., Disp: , Rfl:  .  Magnesium 400 MG TABS, Take 400  mg by mouth daily., Disp: , Rfl:  .  meclizine (ANTIVERT) 25 MG tablet, TAKE ONE TABLET BY MOUTH EVERY 4 TO 6 HOURS AS NEEDED FOR NAUSEA, Disp: 30 tablet, Rfl: 3 .  Misc Natural Products (TURMERIC CURCUMIN) CAPS, Take 1 capsule by mouth daily., Disp: , Rfl:  .  hydrocortisone (ANUSOL-HC) 2.5 % rectal cream, Place 1 application rectally 2 (two) times daily., Disp: 30 g, Rfl: 0 No current facility-administered medications for this visit.   Facility-Administered Medications Ordered in Other Visits:  .  sodium chloride flush (NS) 0.9 % injection 10 mL, 10 mL, Intravenous, PRN, Lloyd Huger, MD, 10 mL at 02/09/16 0930 .   sodium chloride flush (NS) 0.9 % injection 10 mL, 10 mL, Intravenous, PRN, Lloyd Huger, MD, 10 mL at 06/21/16 0950  Review of Systems  Constitutional: Negative.   Gastrointestinal: Positive for blood in stool and rectal pain. Negative for abdominal distention, abdominal pain, anal bleeding, constipation, diarrhea, nausea and vomiting.    Social History   Tobacco Use  . Smoking status: Never Smoker  . Smokeless tobacco: Never Used  Substance Use Topics  . Alcohol use: No    Alcohol/week: 0.0 oz   Objective:   BP 138/86 (BP Location: Left Wrist, Patient Position: Sitting, Cuff Size: Normal)   Pulse 64   Temp 98.2 F (36.8 C) (Oral)   Resp 16   Wt 246 lb (111.6 kg)   BMI 39.71 kg/m  Vitals:   11/17/17 1106  BP: 138/86  Pulse: 64  Resp: 16  Temp: 98.2 F (36.8 C)  TempSrc: Oral  Weight: 246 lb (111.6 kg)     Physical Exam  Constitutional: She is oriented to person, place, and time. She appears well-developed and well-nourished.  Cardiovascular: Normal rate.  Pulmonary/Chest: Effort normal and breath sounds normal.  Abdominal: Soft. Bowel sounds are normal.  Genitourinary: Rectal exam shows external hemorrhoid. Rectal exam shows no mass and no tenderness.  Neurological: She is alert and oriented to person, place, and time.  Skin: Skin is warm and dry.  Psychiatric: She has a normal mood and affect. Her behavior is normal.        Assessment & Plan:     1. BRBPR (bright red blood per rectum)  Some external hemorrhoids but not thrombosed. Do not see anal fissure on exam, however based on history there may be a small one. Start with cream as below. Continue sitz baths and stool softener. May try topical nitroglycerin if no more improvement, or to see GI.  - hydrocortisone (ANUSOL-HC) 2.5 % rectal cream; Place 1 application rectally 2 (two) times daily.  Dispense: 30 g; Refill: 0  2. Rectal pain  - hydrocortisone (ANUSOL-HC) 2.5 % rectal cream; Place 1  application rectally 2 (two) times daily.  Dispense: 30 g; Refill: 0  No Follow-up on file.  The entirety of the information documented in the History of Present Illness, Review of Systems and Physical Exam were personally obtained by me. Portions of this information were initially documented by Ashley Royalty, CMA and reviewed by me for thoroughness and accuracy.        Trinna Post, PA-C  [facility]

## 2017-12-13 IMAGING — US US EXTREM LOW VENOUS BILAT
1 series · 14 of 24 positions shown · non-contrast
Comparison: None

CLINICAL DATA: Lower extremity swelling x5 days, left greater than
right. Edema, varicose veins. History of breast carcinoma.

EXAM:
BILATERAL LOWER EXTREMITY VENOUS DOPPLER ULTRASOUND
TECHNIQUE: Gray-scale sonography with compression, as well as color and duplex
ultrasound, were performed to evaluate the deep venous system from
the level of the common femoral vein through the popliteal and
proximal calf veins.

[Series 1: us extrem low venous bilat · 0.08mm/px · 14 of 64 slices shown]
[im 1/64]
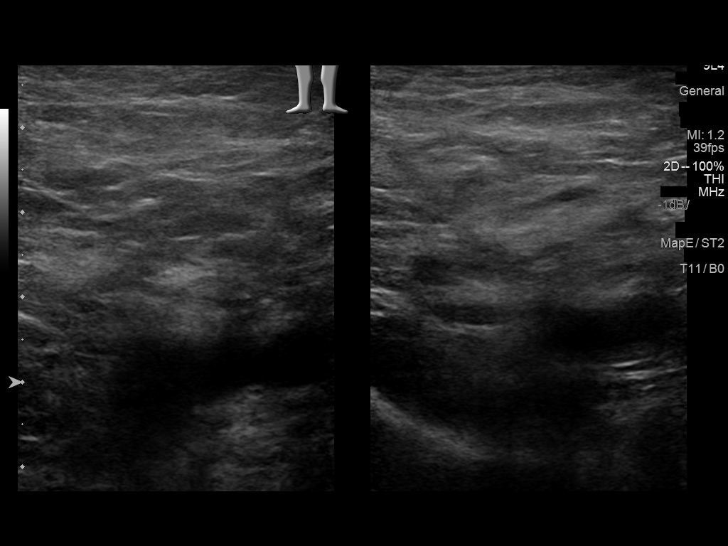
[im 6/64]
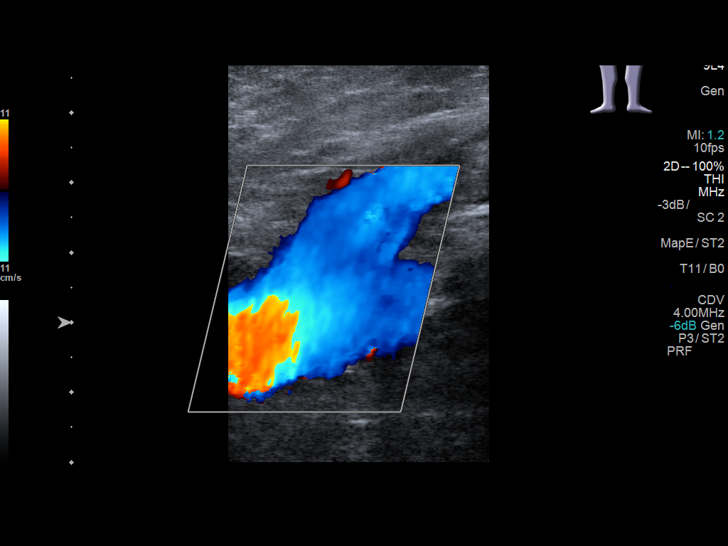
[im 11/64]
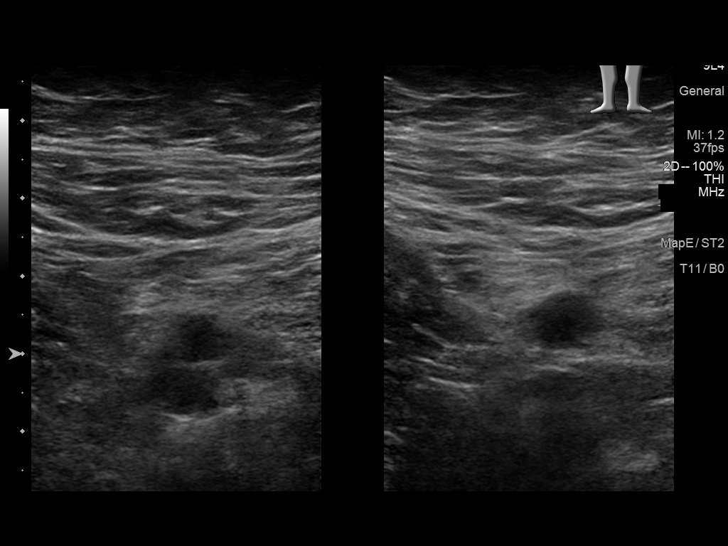
[im 17/64]
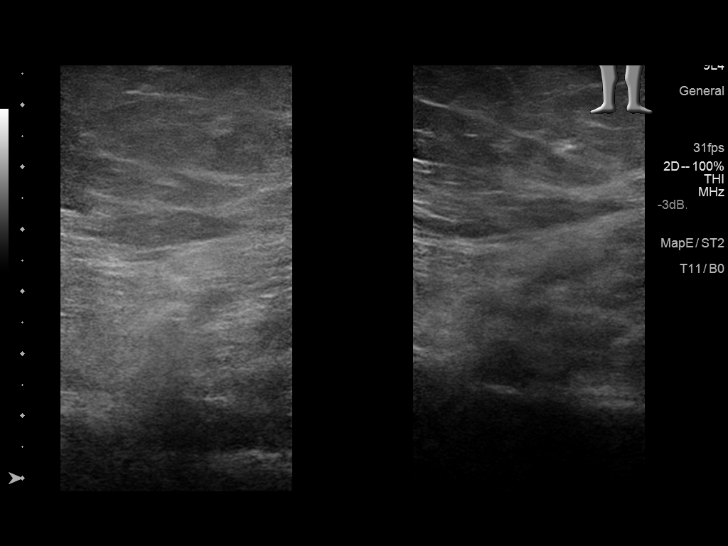
[im 20/64]
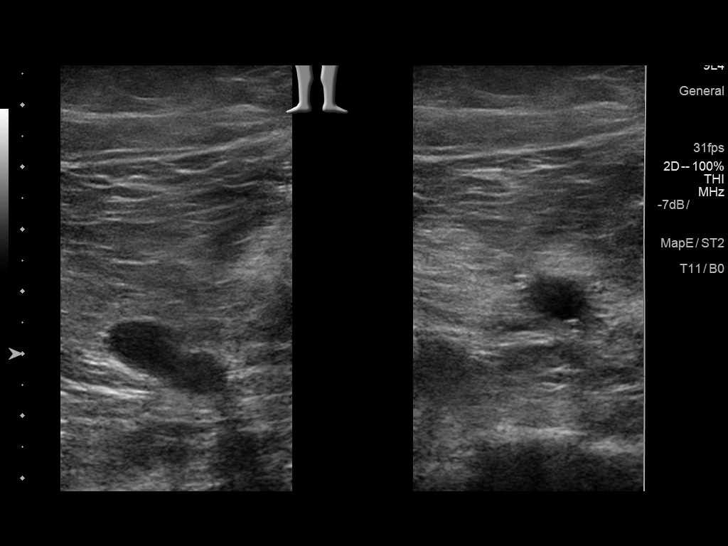
[im 25/64]
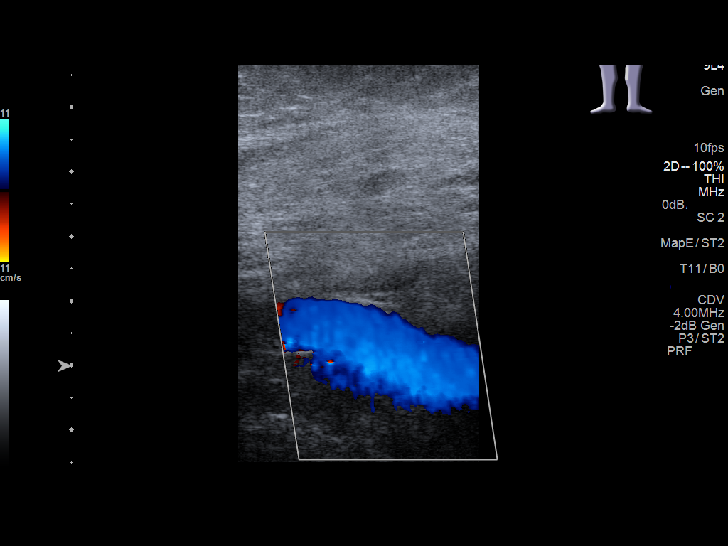
[im 31/64]
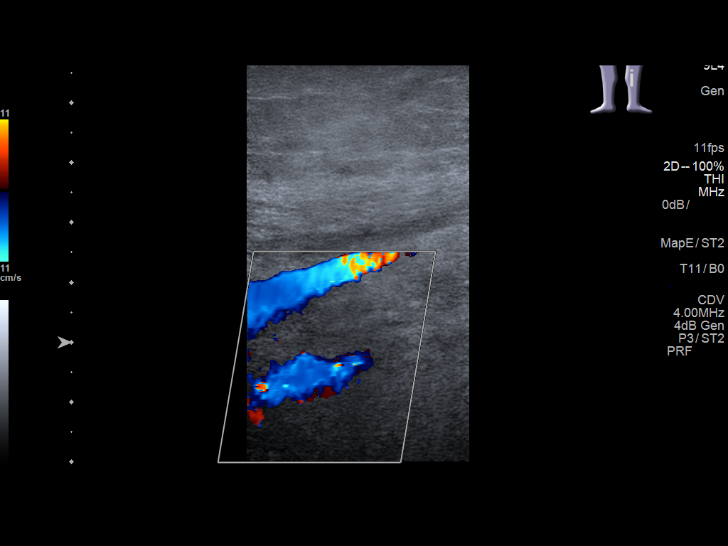
[im 33/64]
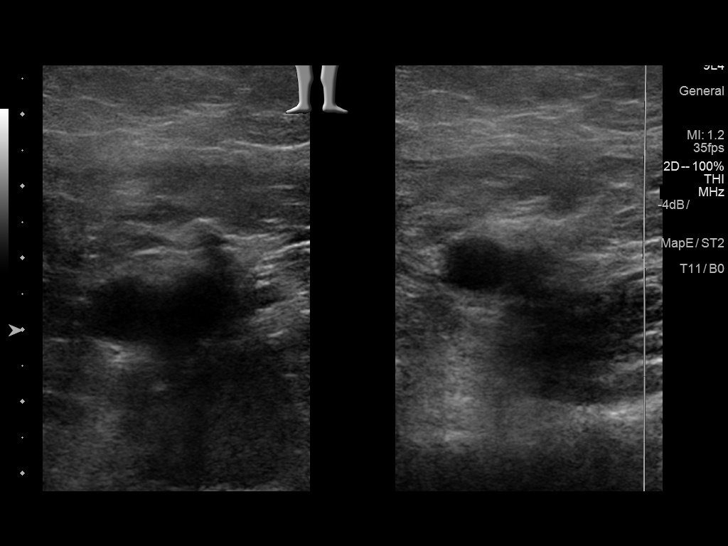
[im 39/64]
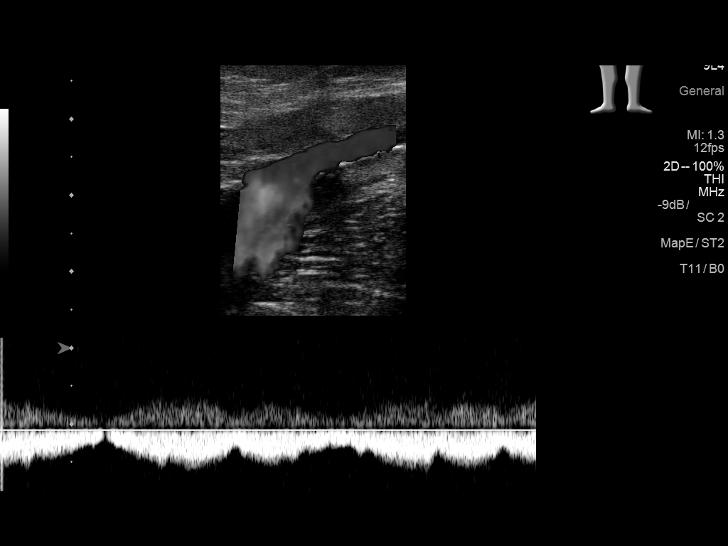
[im 44/64]
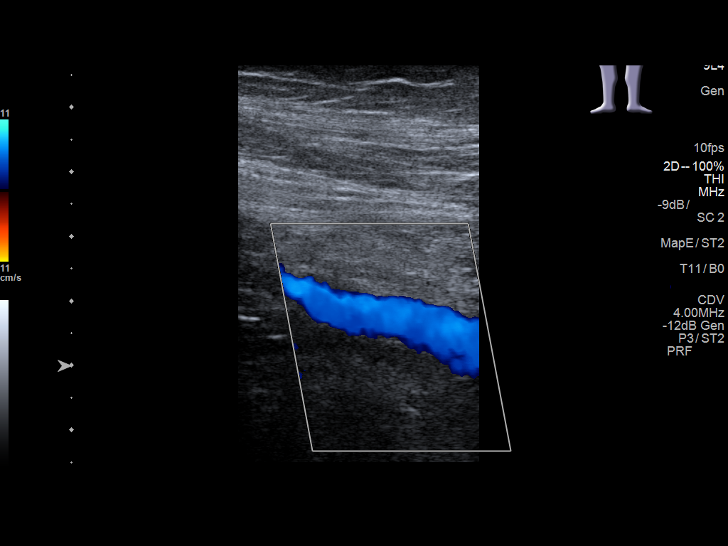
[im 50/64]
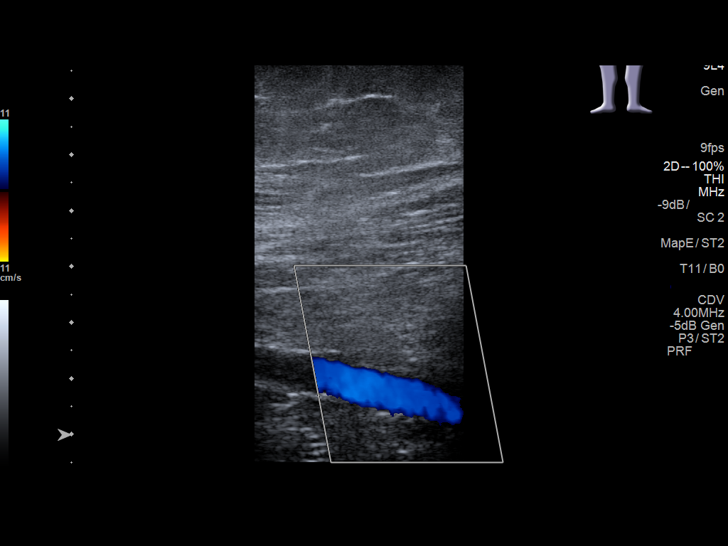
[im 53/64]
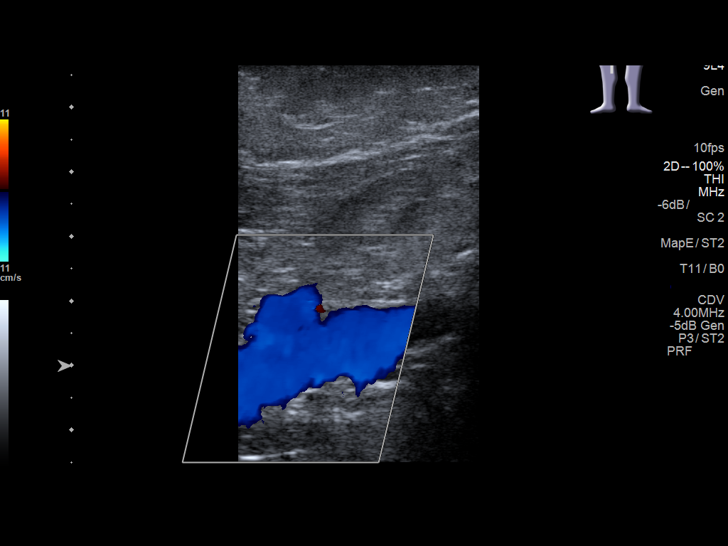
[im 58/64]
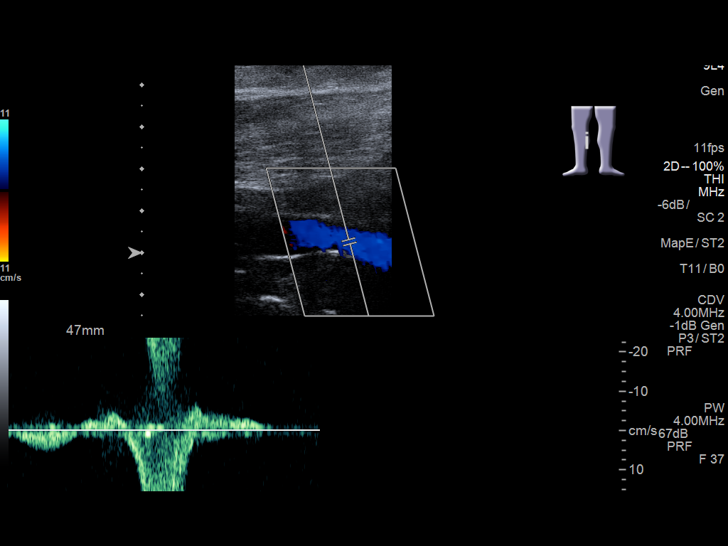
[im 64/64]
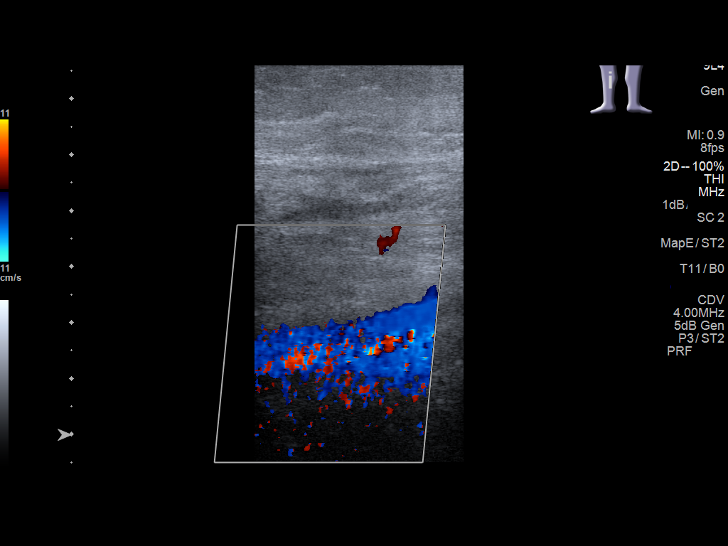

[14 of 24 positions shown; findings below may reference images not displayed]

FINDINGS: Normal compressibility of the common femoral, superficial femoral,
and popliteal veins, as well as the proximal calf veins. No filling
defects to suggest DVT on grayscale or color Doppler imaging.
Doppler waveforms show normal direction of venous flow, normal
respiratory phasicity and response to augmentation.
IMPRESSION: 1. No evidence of lower extremity deep vein thrombosis, BILATERALLY.

## 2017-12-15 ENCOUNTER — Telehealth: Payer: Self-pay | Admitting: Family Medicine

## 2017-12-15 NOTE — Telephone Encounter (Signed)
Ok, please send order as requested.

## 2017-12-15 NOTE — Telephone Encounter (Signed)
Please advise 

## 2017-12-15 NOTE — Telephone Encounter (Signed)
Patient advised rx is ready to pick-up.

## 2017-12-15 NOTE — Telephone Encounter (Signed)
Patient states that she is eligible to get mastectomy bras.  She states that she needs a prescription for these.  She uses [facility] [address].  The phone number for [facility] is [phone].  She does not know how many she is allowed to get.  She thinks that it is four a year but is not sure if it allows her to get them all now or two now and two later.  Please let patient know if she needs to pick this up or if we fax it.

## 2017-12-15 NOTE — Telephone Encounter (Signed)
Order given to provider to sign.

## 2017-12-15 NOTE — Telephone Encounter (Signed)
Patient called back and states that she spoke with [facility] and they told her that the prescription would need to be written as follows:   10 bras and the prosthesis  She would like this faxed to [facility] at [phone] and call her and let her know when this has been done.

## 2017-12-17 ENCOUNTER — Other Ambulatory Visit: Payer: Self-pay | Admitting: Family Medicine

## 2017-12-17 NOTE — Telephone Encounter (Signed)
Patient ID # for [place]  Is  [facility]  She needs refills for 90 days at a time with 3 RF on the following HCTZ 25 mg Atenolol 50 mg. Levothyroxine .75 mcg Letrozole  2.5 mg (she has to use the [employer]ord Brand due to side effects from other brands)

## 2017-12-19 MED ORDER — LEVOTHYROXINE SODIUM 75 MCG PO TABS: 75.0000 ug | ORAL_TABLET | Freq: Every day | ORAL | 3 refills | Status: DC

## 2017-12-19 MED ORDER — LETROZOLE 2.5 MG PO TABS: 2.5000 mg | ORAL_TABLET | Freq: Every day | ORAL | 3 refills | Status: DC

## 2017-12-19 MED ORDER — ATENOLOL 50 MG PO TABS: 50.0000 mg | ORAL_TABLET | Freq: Every day | ORAL | 4 refills | Status: DC

## 2017-12-19 MED ORDER — HYDROCHLOROTHIAZIDE 25 MG PO TABS: 25.0000 mg | ORAL_TABLET | Freq: Every day | ORAL | 4 refills | Status: DC

## 2017-12-21 ENCOUNTER — Other Ambulatory Visit: Payer: Self-pay | Admitting: Family Medicine

## 2017-12-21 NOTE — Telephone Encounter (Signed)
Pt stated that she had requested new Rx for all her medications be sent to [facility] since her insurance changed and requires her to use this pharmacy.   Pt requested letrozole [facility]) 2.5 MG tablet manufacturer [employer] but [facility] advised that wasn't the manufacturer that was on the Rx sent on 12/19/17. Pt is requesting a new Rx for letrozole [facility]) 2.5 MG tablet manufacturer Accord and to request the manufacturer Accord and to include refills. Pt stated that the Accord doesn't case side effects like the other brand.  Also, pt stated [facility] advised her the Rx they received for all her medications were for a 90 day and no refills. I advised that our records show all medications were sent in for 90 day supply with enough refills to last pt a year. Pt is requesting [place] to look into why [facility] is showing no refills and send in new Rx if needed.  Please advise. Thanks TNP

## 2017-12-22 NOTE — Telephone Encounter (Signed)
[employer] who stated that the rx's that were sent in on 12/19/2017 are on hold until the patient calls with "an okay to release order". Also notified [employer] that the letrozole needs to be the Accord manufacturer due to side effects. [facility] stated they will make note in letrozole prescription to use Accord manufacturer.  Patient was advised.

## 2017-12-29 ENCOUNTER — Telehealth: Payer: Self-pay | Admitting: Family Medicine

## 2017-12-29 DIAGNOSIS — K649 Unspecified hemorrhoids: Secondary | ICD-10-CM

## 2017-12-29 DIAGNOSIS — K625 Hemorrhage of anus and rectum: Secondary | ICD-10-CM

## 2017-12-29 NOTE — Telephone Encounter (Signed)
Pt stated she was returning Karmin's call about a referral and request a call back. Please advise. Thanks TNP

## 2017-12-29 NOTE — Telephone Encounter (Signed)
Pt states she is still having rectal bleeding and pain.She would like referral to specialist

## 2017-12-30 NOTE — Telephone Encounter (Signed)
Spoke with pt,advised referral was sent to [facility].Their office will contact pt

## 2018-01-03 ENCOUNTER — Encounter: Payer: Self-pay | Admitting: Gastroenterology

## 2018-01-03 ENCOUNTER — Ambulatory Visit (INDEPENDENT_AMBULATORY_CARE_PROVIDER_SITE_OTHER): Payer: Medicare Other | Admitting: Gastroenterology

## 2018-01-03 ENCOUNTER — Other Ambulatory Visit: Payer: Self-pay

## 2018-01-03 VITALS — BP 124/87 | HR 77 | Temp 98.0°F | Ht 66.0 in | Wt 242.4 lb

## 2018-01-03 DIAGNOSIS — K625 Hemorrhage of anus and rectum: Secondary | ICD-10-CM | POA: Diagnosis not present

## 2018-01-03 DIAGNOSIS — K641 Second degree hemorrhoids: Secondary | ICD-10-CM

## 2018-01-03 DIAGNOSIS — K648 Other hemorrhoids: Secondary | ICD-10-CM | POA: Diagnosis not present

## 2018-01-03 DIAGNOSIS — K5904 Chronic idiopathic constipation: Secondary | ICD-10-CM

## 2018-01-03 MED ORDER — CLOTRIMAZOLE-BETAMETHASONE 1-0.05 % EX CREA: 1.0000 "application " | TOPICAL_CREAM | Freq: Two times a day (BID) | CUTANEOUS | 0 refills | Status: AC

## 2018-01-03 NOTE — Progress Notes (Signed)
Cephas Darby, MD [address]  [place]  [place], [place] [address]  Main: [phone]  Fax: [phone]    Gastroenterology Consultation  Referring Provider:     Birdie Sons, MD Primary Care Physician:  Birdie Sons, MD Primary Gastroenterologist:  Dr. Cephas Darby Reason for Consultation:     Rectal bleeding, symptomatic hemorrhoids        HPI:   Kaylee Reyes is a 67 y.o. female referred by Dr. Birdie Sons, MD  for consultation & management of symptomatically hemorrhoids. She reports that she has been dealing with rectal bleeding, on wiping, Perianal itching, prolapse, pain, sensation of incomplete emptying, alternating constipation and diarrhea for the last 3-4 months. This has gotten worse since colonoscopy. She is taking over-the-counter stool softeners which provided only modest benefit. She had a colonoscopy in 08/2017 which was normal. She has history of breast cancer status post bilateral mastectomy, on hormone replacement therapy, closely followed by Dr. Maryjane Hurter at [facility]. She reports taking topical hydrocortisone cream which provides temporary relief. She is currently on a keto diet since June 2018.   NSAIDs: ibuprofen as needed  Antiplts/Anticoagulants/Anti thrombotics: none  GI Procedures:  Colonoscopy 09/01/2017: The perianal and digital rectal examinations were normal. Multiple small-mouthed diverticula were found in the sigmoid colon. Non-bleeding internal hemorrhoids were found during retroflexion. The hemorrhoids were Grade I (internal hemorrhoids that do not prolapse).  Past Medical History:  Diagnosis Date  . Anemia   . Cancer ([place]) 09/2015   breast cancer  . GERD (gastroesophageal reflux disease)   . History of chicken pox   . Hyperlipidemia   . Hypertension   . Hypothyroidism   . Neuropathy of foot    bilateral  . PONV (postoperative nausea and vomiting)    exploratory surgery at [facility]  . Thyroid disease   .  Tuberculosis exposure    Mother had TB.  Pt shows (+) on tests, but has never had.  . Vertigo    last episode over 1 yr ago  . Wears dentures    partial upper and lower    Past Surgical History:  Procedure Laterality Date  . ABDOMINAL HYSTERECTOMY  07/13/2013   Total. with BSO for postmenopausal bleeding and fibroids with large cervical polyp. Dr. Levy Sjogren and Dr. Glennon Mac at [facility]  . ABDOMINAL ULTRASOUND  04/17/2011   Hepatomegaly with borderline slenomegaly. Suggestive of fatty liver. s/p cholecystectomy. Portions of aorta obscured  . BREAST SURGERY Right 2011   BREAST BIOPSY  . CESAREAN SECTION  1988  . CHOLECYSTECTOMY  1990  . COLONOSCOPY WITH PROPOFOL N/A 09/01/2017   Procedure: COLONOSCOPY WITH PROPOFOL;  Surgeon: [name], MD;  Location: [facility];  Service: Gastroenterology;  Laterality: N/A;  . EXPLORATORY LAPAROTOMY     Left breast  . MASTECTOMY W/ SENTINEL NODE BIOPSY Bilateral 10/28/2015   Procedure: MASTECTOMY WITH SENTINEL LYMPH NODE BIOPSY;  Surgeon: Hubbard Robinson, MD;  Location: [facility];  Service: General;  Laterality: Bilateral;  . PORT-A-CATH REMOVAL Left 10/11/2016   Procedure: REMOVAL PORT-A-CATH;  Surgeon: Hubbard Robinson, MD;  Location: [facility];  Service: General;  Laterality: Left;  . PORTACATH PLACEMENT Left 12/23/2015   Procedure: INSERTION PORT-A-CATH;  Surgeon: Hubbard Robinson, MD;  Location: [facility];  Service: General;  Laterality: Left;  . WRIST SURGERY       Current Outpatient Medications:  .  atenolol (TENORMIN) 50 MG tablet, Take 1 tablet (50 mg total) by mouth daily.,  Disp: 90 tablet, Rfl: 4 .  B Complex-C (SUPER B COMPLEX PO), Take 1 tablet by mouth daily., Disp: , Rfl:  .  calcium carbonate (OS-CAL) 600 MG TABS tablet, Take 1 tablet (600 mg total) by mouth 2 (two) times daily with a meal., Disp: 60 tablet, Rfl: 5 .  Cholecalciferol (VITAMIN D3) 2000 units TABS, Take 2,000 Units by mouth daily., Disp: , Rfl:  .  docusate  sodium (COLACE) 100 MG capsule, Take 200 mg by mouth daily. , Disp: , Rfl:  .  hydrochlorothiazide (HYDRODIURIL) 25 MG tablet, Take 1 tablet (25 mg total) by mouth daily., Disp: 90 tablet, Rfl: 4 .  hydrocortisone (ANUSOL-HC) 2.5 % rectal cream, Place 1 application rectally 2 (two) times daily., Disp: 30 g, Rfl: 0 .  Hydrocortisone Micronized POWD, RINSE AND SPIT 5ML BY MOUTH EVERY 3 TO 4 HOURS AS NEEDED, Disp: 1 Bottle, Rfl: 4 .  ibuprofen (ADVIL,MOTRIN) 200 MG tablet, Take 400 mg by mouth every 6 (six) hours as needed for mild pain., Disp: , Rfl:  .  letrozole (FEMARA) 2.5 MG tablet, Take 1 tablet (2.5 mg total) by mouth daily., Disp: 90 tablet, Rfl: 3 .  levothyroxine (SYNTHROID, LEVOTHROID) 75 MCG tablet, Take 1 tablet (75 mcg total) by mouth daily., Disp: 90 tablet, Rfl: 3 .  Loratadine (CLARITIN) 10 MG CAPS, Take by mouth., Disp: , Rfl:  .  Magnesium 400 MG TABS, Take 400 mg by mouth daily., Disp: , Rfl:  .  meclizine (ANTIVERT) 25 MG tablet, TAKE ONE TABLET BY MOUTH EVERY 4 TO 6 HOURS AS NEEDED FOR NAUSEA, Disp: 30 tablet, Rfl: 3 .  Misc Natural Products (TURMERIC CURCUMIN) CAPS, Take 1 capsule by mouth daily., Disp: , Rfl:  .  cyclobenzaprine (FLEXERIL) 5 MG tablet, TAKE 1 TABLET(5 MG) BY MOUTH THREE TIMES DAILY AS NEEDED FOR MUSCLE SPASMS, Disp: 30 tablet, Rfl: 5 .  ketoconazole (NIZORAL) 2 % cream, Apply 1 application topically daily. (Patient not taking: Reported on 01/03/2018), Disp: 50 g, Rfl: 3 No current facility-administered medications for this visit.   Facility-Administered Medications Ordered in Other Visits:  .  sodium chloride flush (NS) 0.9 % injection 10 mL, 10 mL, Intravenous, PRN, Lloyd Huger, MD, 10 mL at 02/09/16 0930 .  sodium chloride flush (NS) 0.9 % injection 10 mL, 10 mL, Intravenous, PRN, Lloyd Huger, MD, 10 mL at 06/21/16 0950   Family History  Problem Relation Age of Onset  . Hypertension Mother   . Thyroid disease Mother   . Hypertension  Brother   . Diabetes Brother   . Breast cancer Neg Hx   . Ovarian cancer Neg Hx   . Colon cancer Neg Hx      Social History   Tobacco Use  . Smoking status: Never Smoker  . Smokeless tobacco: Never Used  Substance Use Topics  . Alcohol use: No    Alcohol/week: 0.0 oz  . Drug use: No    Allergies as of 01/03/2018 - Review Complete 01/03/2018  Allergen Reaction Noted  . Penicillins Shortness Of Breath 08/20/2015  . Nickel Hives 10/15/2015  . Pravastatin sodium Other (See Comments) 08/20/2015    Review of Systems:    All systems reviewed and negative except where noted in HPI.   Physical Exam:  BP 124/87   Pulse 77   Temp 98 F (36.7 C)   Ht 5\' 6"  (1.676 m)   Wt 242 lb 6.4 oz (110 kg)   BMI 39.12 kg/m  No  LMP recorded. Patient has had a hysterectomy.  General:   Alert,  Well-developed, well-nourished, pleasant and cooperative in NAD Head:  Normocephalic and atraumatic. Eyes:  Sclera clear, no icterus.   Conjunctiva pink. Ears:  Normal auditory acuity. Nose:  No deformity, discharge, or lesions. Mouth:  No deformity or lesions,oropharynx pink & moist. Neck:  Supple; no masses or thyromegaly. Lungs:  Respirations even and unlabored.  Clear throughout to auscultation.   No wheezes, crackles, or rhonchi. No acute distress. Heart:  Regular rate and rhythm; no murmurs, clicks, rubs, or gallops. Abdomen:  Normal bowel sounds. Soft, non-tender and non-distended without masses, hepatosplenomegaly or hernias noted.  No guarding or rebound tenderness.   Rectal: presence of external hemorrhoids, digital rectal exam revealed solid brown stool in rectal vault and internal hemorrhoids. Anoscopy was performed, no evidence of fissure Msk:  Symmetrical without gross deformities. Good, equal movement & strength bilaterally. Pulses:  Normal pulses noted. Extremities:  No clubbing or edema.  No cyanosis. Neurologic:  Alert and oriented x3;  grossly normal neurologically. Skin:  Intact  without significant lesions or rashes. No jaundice. Lymph Nodes:  No significant cervical adenopathy. Psych:  Alert and cooperative. Normal mood and affect.  Imaging Studies: No recent abdominal imaging  Assessment and Plan:   Kaylee Reyes is a 67 y.o. female with history of breast cancer status post bilateral mastectomy, currently on hormone replacement therapy is seen in consultation for treatment of symptomatic internal and external hemorrhoids as well as chronic constipation  - Discussed with patient about outpatient hemorrhoid banding and she decided to proceed with it. Consent obtained prior to performing ligation. Risks and benefits were discussed. - continue stool softeners and fiber supplements - samples of Amitiza 30mcg given - Continue hydrocortisone cream and start Lotrisone cream twice daily  Follow up in 2 weeks for banding   Cephas Darby, MD

## 2018-01-03 NOTE — Progress Notes (Signed)
PROCEDURE NOTE: The patient presents with symptomatic grade 2 hemorrhoids, unresponsive to maximal medical therapy, requesting rubber band ligation of his/her hemorrhoidal disease.  All risks, benefits and alternative forms of therapy were described and informed consent was obtained.  In the Left Lateral Decubitus position (if anoscopy is performed) anoscopic examination revealed grade 2 hemorrhoids in the all position(s).   The decision was made to band the RA internal hemorrhoid, and the [facility] O'Regan System was used to perform band ligation without complication.  Digital anorectal examination was then performed to assure proper positioning of the band, and to adjust the banded tissue as required.  The patient was discharged home without pain or other issues.  Dietary and behavioral recommendations were given and (if necessary - prescriptions were given), along with follow-up instructions.  The patient will return 2 weeks for follow-up and possible additional banding as required.  No complications were encountered and the patient tolerated the procedure well.  Rohini R Vanga, MD [address]  [address]  , [place] [address]  Main: [phone]  Fax: [phone] Pager: [phone]     

## 2018-01-06 ENCOUNTER — Telehealth: Payer: Self-pay | Admitting: *Deleted

## 2018-01-06 NOTE — Telephone Encounter (Signed)
Patient called and was seen here a few days ago and got some samples of Amitiva. She wants to see if she can get a prescription sent in to her pharmacy ([facility]

## 2018-01-09 ENCOUNTER — Encounter: Payer: Self-pay | Admitting: *Deleted

## 2018-01-09 ENCOUNTER — Inpatient Hospital Stay: Payer: Medicare Other | Attending: Nurse Practitioner | Admitting: Nurse Practitioner

## 2018-01-09 ENCOUNTER — Telehealth: Payer: Self-pay

## 2018-01-09 ENCOUNTER — Encounter: Payer: Self-pay | Admitting: Nurse Practitioner

## 2018-01-09 ENCOUNTER — Telehealth: Payer: Self-pay | Admitting: Gastroenterology

## 2018-01-09 VITALS — BP 155/94 | HR 61 | Temp 97.3°F | Resp 20 | Wt 245.5 lb

## 2018-01-09 DIAGNOSIS — E785 Hyperlipidemia, unspecified: Secondary | ICD-10-CM | POA: Insufficient documentation

## 2018-01-09 DIAGNOSIS — Z79811 Long term (current) use of aromatase inhibitors: Secondary | ICD-10-CM | POA: Insufficient documentation

## 2018-01-09 DIAGNOSIS — Z923 Personal history of irradiation: Secondary | ICD-10-CM | POA: Diagnosis not present

## 2018-01-09 DIAGNOSIS — Z170 Estrogen receptor positive status [ER+]: Secondary | ICD-10-CM | POA: Diagnosis not present

## 2018-01-09 DIAGNOSIS — K219 Gastro-esophageal reflux disease without esophagitis: Secondary | ICD-10-CM | POA: Insufficient documentation

## 2018-01-09 DIAGNOSIS — C50911 Malignant neoplasm of unspecified site of right female breast: Secondary | ICD-10-CM

## 2018-01-09 DIAGNOSIS — M549 Dorsalgia, unspecified: Secondary | ICD-10-CM | POA: Insufficient documentation

## 2018-01-09 DIAGNOSIS — R0789 Other chest pain: Secondary | ICD-10-CM | POA: Diagnosis not present

## 2018-01-09 DIAGNOSIS — I10 Essential (primary) hypertension: Secondary | ICD-10-CM | POA: Diagnosis not present

## 2018-01-09 DIAGNOSIS — L7682 Other postprocedural complications of skin and subcutaneous tissue: Secondary | ICD-10-CM

## 2018-01-09 DIAGNOSIS — E039 Hypothyroidism, unspecified: Secondary | ICD-10-CM | POA: Insufficient documentation

## 2018-01-09 DIAGNOSIS — Z9013 Acquired absence of bilateral breasts and nipples: Secondary | ICD-10-CM | POA: Diagnosis not present

## 2018-01-09 DIAGNOSIS — Z9221 Personal history of antineoplastic chemotherapy: Secondary | ICD-10-CM | POA: Insufficient documentation

## 2018-01-09 DIAGNOSIS — Z79899 Other long term (current) drug therapy: Secondary | ICD-10-CM | POA: Diagnosis not present

## 2018-01-09 NOTE — Telephone Encounter (Signed)
Patient called saying that she needs a STAT referral sent to [place] w/ Dr. Hampton Abbot due to her having pain in the area where she had her mastectomy. She reports that we need to place the order in order for her to be seen. She was hoping to be seen today. Ok to order? Please advise. Contact number is [phone]. Thanks!

## 2018-01-09 NOTE — Telephone Encounter (Signed)
Patient called stating she has some pain in her breast. Pain started this morning around 7AM. She states that the pain is radiating to her back from her breast. She states that she has not had this type of pain in before and that she did a self-breast exam and did not feel any new lumps or knots. She also states that she was having a hard time catching her breath this morning when trying to get up. She states that she would like to be seen today rather than waiting until 2/13 to be seen by Dr. Hampton Abbot. I advised her that I would have to speak with the provider that I have in office and give her a call back with what he recommends. Patient verbalized understanding.

## 2018-01-09 NOTE — Progress Notes (Signed)
  Oncology Nurse Navigator Documentation  Navigator Location: [facility] (01/09/18 1000)   )Navigator Encounter Type: Clinic/MDC (01/09/18 1000)                         Barriers/Navigation Needs: Education;Coordination of Care (01/09/18 1000)   Interventions: Coordination of Care (01/09/18 1000)   Coordination of Care: Appts (01/09/18 1000)                  Time Spent with Patient: 15 (01/09/18 1000)   Met patient in the lobby.  States she has questions about how she is feeling.  History of bilateral mastectomy for right breast cancer in 2016.  States she woke up this morning with right chest pain. States she is not sure if "it radiates from front to back or back to front".  States the pain is constant and gets worse with inhalation.  Rates pain an 8 when she breathes.  Denies fever, chills, cough, heavy exercise.  She does complain of shortness of breath "due to the pain".  I have discussed case with Beckey Rutter, NP and she is agreeable to see the patient.  Patient is willing to be worked in today.

## 2018-01-09 NOTE — Progress Notes (Signed)
Symptom Management Consult note [facility]  [phone] [phone]  Patient Care Team: Birdie Sons, MD as PCP - General (Family Medicine) [name], MD as Consulting Physician (Gastroenterology) Hubbard Robinson, MD as Consulting Physician (Surgery)   Name of the patient: Kaylee Reyes  [id number]  [birth year]   Date of visit: 01/09/18  Diagnosis- Pathologic stage IIB ER/PR +, invasive lobular carcinoma of right upper breast  Chief complaint/ Reason for visit- chest/back pain  Heme/Onc history: Patient last evaluated by primary oncologist, Dr. Grayland Ormond, on 09/19/17. Patient is s/p bilateral mastectomy. She completed 4 cycles of taxotere and cytoxan on March 03, 2016. She has completed adjuvant XRT.   Interval history-patient presents to symptom management clinic for complaints of right-sided back pain that radiates to right chest.  Started noticing symptoms this morning.  Describes pain as throbbing.  Rates discomfort 4 of 10 in intensity.  Denies shortness of breath, claudication, or dyspnea.  Nothing seems to make pain better or worse.  Pain is worsened by movement.  Denies trauma or injury.  Takes ibuprofen for symptomatic hemorrhoids.  ECOG FS:0 - Asymptomatic  Review of systems- Review of Systems  Constitutional: Negative.   HENT: Negative.   Eyes: Negative.   Respiratory: Negative.   Cardiovascular: Negative.   Gastrointestinal: Negative.   Genitourinary: Negative.   Musculoskeletal: Positive for back pain, falls and myalgias. Negative for joint pain and neck pain.  Skin: Negative.   Neurological: Negative for tingling, tremors, sensory change, speech change and focal weakness.  Psychiatric/Behavioral: The patient is nervous/anxious.      Current treatment- surveillance  Allergies  Allergen Reactions  . Penicillins Shortness Of Breath    Has patient had a PCN reaction causing immediate rash,  facial/tongue/throat swelling, SOB or lightheadedness with hypotension: Yes Has patient had a PCN reaction causing severe rash involving mucus membranes or skin necrosis: No Has patient had a PCN reaction that required hospitalization No Has patient had a PCN reaction occurring within the last 10 years: No If all of the above answers are "NO", then may proceed with Cephalosporin use.   . Nickel Hives  . Pravastatin Sodium Other (See Comments)    Myalgia      Past Medical History:  Diagnosis Date  . Anemia   . Cancer ([place]) 09/2015   breast cancer  . GERD (gastroesophageal reflux disease)   . History of chicken pox   . Hyperlipidemia   . Hypertension   . Hypothyroidism   . Neuropathy of foot    bilateral  . PONV (postoperative nausea and vomiting)    exploratory surgery at [facility]  . Thyroid disease   . Tuberculosis exposure    Mother had TB.  Pt shows (+) on tests, but has never had.  . Vertigo    last episode over 1 yr ago  . Wears dentures    partial upper and lower     Past Surgical History:  Procedure Laterality Date  . ABDOMINAL HYSTERECTOMY  07/13/2013   Total. with BSO for postmenopausal bleeding and fibroids with large cervical polyp. Dr. Levy Sjogren and Dr. Glennon Mac at [facility]  . ABDOMINAL ULTRASOUND  04/17/2011   Hepatomegaly with borderline slenomegaly. Suggestive of fatty liver. s/p cholecystectomy. Portions of aorta obscured  . BREAST SURGERY Right 2011   BREAST BIOPSY  . CESAREAN SECTION  1988  . CHOLECYSTECTOMY  1990  . COLONOSCOPY WITH PROPOFOL N/A 09/01/2017   Procedure: COLONOSCOPY WITH PROPOFOL;  Surgeon: [name],  MD;  Location: [place];  Service: Gastroenterology;  Laterality: N/A;  . EXPLORATORY LAPAROTOMY     Left breast  . MASTECTOMY W/ SENTINEL NODE BIOPSY Bilateral 10/28/2015   Procedure: MASTECTOMY WITH SENTINEL LYMPH NODE BIOPSY;  Surgeon: Hubbard Robinson, MD;  Location: [facility];  Service: General;  Laterality: Bilateral;  .  PORT-A-CATH REMOVAL Left 10/11/2016   Procedure: REMOVAL PORT-A-CATH;  Surgeon: Hubbard Robinson, MD;  Location: [facility];  Service: General;  Laterality: Left;  . PORTACATH PLACEMENT Left 12/23/2015   Procedure: INSERTION PORT-A-CATH;  Surgeon: Hubbard Robinson, MD;  Location: [facility];  Service: General;  Laterality: Left;  . WRIST SURGERY      Social History   Socioeconomic History  . Marital status: Married    Spouse name: Not on file  . Number of children: 2  . Years of education: Not on file  . Highest education level: Not on file  Social Needs  . Financial resource strain: Not on file  . Food insecurity - worry: Not on file  . Food insecurity - inability: Not on file  . Transportation needs - medical: Not on file  . Transportation needs - non-medical: Not on file  Occupational History  . Not on file  Tobacco Use  . Smoking status: Never Smoker  . Smokeless tobacco: Never Used  Substance and Sexual Activity  . Alcohol use: No    Alcohol/week: 0.0 oz  . Drug use: No  . Sexual activity: No    Birth control/protection: Surgical  Other Topics Concern  . Not on file  Social History Narrative  . Not on file    Family History  Problem Relation Age of Onset  . Hypertension Mother   . Thyroid disease Mother   . Hypertension Brother   . Diabetes Brother   . Breast cancer Neg Hx   . Ovarian cancer Neg Hx   . Colon cancer Neg Hx      Current Outpatient Medications:  .  atenolol (TENORMIN) 50 MG tablet, Take 1 tablet (50 mg total) by mouth daily., Disp: 90 tablet, Rfl: 4 .  B Complex-C (SUPER B COMPLEX PO), Take 1 tablet by mouth daily., Disp: , Rfl:  .  calcium carbonate (OS-CAL) 600 MG TABS tablet, Take 1 tablet (600 mg total) by mouth 2 (two) times daily with a meal., Disp: 60 tablet, Rfl: 5 .  Cholecalciferol (VITAMIN D3) 2000 units TABS, Take 2,000 Units by mouth daily., Disp: , Rfl:  .  clotrimazole-betamethasone (LOTRISONE) cream, Apply 1 application topically  2 (two) times daily for 14 days., Disp: 28 g, Rfl: 0 .  cyclobenzaprine (FLEXERIL) 5 MG tablet, TAKE 1 TABLET(5 MG) BY MOUTH THREE TIMES DAILY AS NEEDED FOR MUSCLE SPASMS, Disp: 30 tablet, Rfl: 5 .  hydrochlorothiazide (HYDRODIURIL) 25 MG tablet, Take 1 tablet (25 mg total) by mouth daily., Disp: 90 tablet, Rfl: 4 .  hydrocortisone (ANUSOL-HC) 2.5 % rectal cream, Place 1 application rectally 2 (two) times daily., Disp: 30 g, Rfl: 0 .  Hydrocortisone Micronized POWD, RINSE AND SPIT 5ML BY MOUTH EVERY 3 TO 4 HOURS AS NEEDED, Disp: 1 Bottle, Rfl: 4 .  ibuprofen (ADVIL,MOTRIN) 200 MG tablet, Take 400 mg by mouth every 6 (six) hours as needed for mild pain., Disp: , Rfl:  .  letrozole (FEMARA) 2.5 MG tablet, Take 1 tablet (2.5 mg total) by mouth daily., Disp: 90 tablet, Rfl: 3 .  levothyroxine (SYNTHROID, LEVOTHROID) 75 MCG tablet, Take 1 tablet (75 mcg  total) by mouth daily., Disp: 90 tablet, Rfl: 3 .  Loratadine (CLARITIN) 10 MG CAPS, Take by mouth., Disp: , Rfl:  .  Magnesium 400 MG TABS, Take 400 mg by mouth daily., Disp: , Rfl:  .  meclizine (ANTIVERT) 25 MG tablet, TAKE ONE TABLET BY MOUTH EVERY 4 TO 6 HOURS AS NEEDED FOR NAUSEA, Disp: 30 tablet, Rfl: 3 .  Misc Natural Products (TURMERIC CURCUMIN) CAPS, Take 1 capsule by mouth daily., Disp: , Rfl:  .  docusate sodium (COLACE) 100 MG capsule, Take 200 mg by mouth daily. , Disp: , Rfl:  .  ketoconazole (NIZORAL) 2 % cream, Apply 1 application topically daily. (Patient not taking: Reported on 01/03/2018), Disp: 50 g, Rfl: 3 No current facility-administered medications for this visit.   Facility-Administered Medications Ordered in Other Visits:  .  sodium chloride flush (NS) 0.9 % injection 10 mL, 10 mL, Intravenous, PRN, Lloyd Huger, MD, 10 mL at 02/09/16 0930 .  sodium chloride flush (NS) 0.9 % injection 10 mL, 10 mL, Intravenous, PRN, Lloyd Huger, MD, 10 mL at 06/21/16 0950  Physical exam:  Vitals:   01/09/18 1039  BP: (!)  155/94  Pulse: 61  Resp: 20  Temp: (!) 97.3 F (36.3 C)  TempSrc: Tympanic  Weight: 245 lb 8 oz (111.4 kg)   Physical Exam  Constitutional: She is well-developed, well-nourished, and in no distress.  HENT:  Head: Normocephalic.  Eyes: No scleral icterus.  Neck: Normal range of motion. Neck supple.  Cardiovascular: Normal rate, regular rhythm, normal heart sounds and intact distal pulses.  Pulmonary/Chest: Effort normal and breath sounds normal. No respiratory distress. She has no wheezes.  Abdominal: Soft. Bowel sounds are normal.  Musculoskeletal: Normal range of motion. She exhibits tenderness.       Right shoulder: She exhibits tenderness. She exhibits normal range of motion, no bony tenderness, no swelling, no effusion and no crepitus.       Left shoulder: Normal.       Cervical back: Normal.       Thoracic back: Normal.  Right posterior chest- TTP- diffuse. Approx level of 6th rib. No heat, erythema, or swelling. Anterior right chest- TTP at 2-4. Diffuse pain. No heat, erythema, or swelling. Well healed surgical scars. No evidence of infection.   CMP Latest Ref Rng & Units 04/04/2017  Glucose 65 - 99 mg/dL 111(H)  BUN 8 - 27 mg/dL 12  Creatinine 0.57 - 1.00 mg/dL 0.84  Sodium 134 - 144 mmol/L 143  Potassium 3.5 - 5.2 mmol/L 4.6  Chloride 96 - 106 mmol/L 99  CO2 18 - 29 mmol/L 27  Calcium 8.7 - 10.3 mg/dL 9.9  Total Protein 6.0 - 8.5 g/dL 7.6  Total Bilirubin 0.0 - 1.2 mg/dL 0.4  Alkaline Phos 39 - 117 IU/L 108  AST 0 - 40 IU/L 27  ALT 0 - 32 IU/L 29   CBC Latest Ref Rng & Units 04/04/2017  WBC 3.4 - 10.8 x10E3/uL 8.1  Hemoglobin 11.1 - 15.9 g/dL 11.6  Hematocrit 34.0 - 46.6 % 34.1  Platelets 150 - 379 x10E3/uL 219     Assessment and plan- Patient is a 67 y.o. female who presents to symptom management clinic today for right back pain.  1.  Pathologic stage IIb ER/PR positive invasive lobular carcinoma of the right upper breast.  Status post bilateral mastectomy.   Completed 4 cycles of TC chemotherapy on 03/03/16.  Completed adjuvant XRT.  Currently on letrozole.  Baseline bone  density was normal with plans to repeat in 07/2018.  Currently on surveillance.  2.  Chest wall pain-pain reproducible with palpation of chest wall. NSAIDs for anti-inflammatory action as tolerated and flexeril. NSAID precautions provided. Follow-up with PCP if symptoms do not improve in next week.    Visit Diagnosis 1. Chest wall pain   2. Invasive lobular carcinoma of right breast, stage 2 ([place])     Patient expressed understanding and was in agreement with this plan. She also understands that She can call clinic at any time with any questions, concerns, or complaints.   A total of (30) minutes of face-to-face time was spent with this patient with greater than 50% of that time in counseling and care-coordination.  Beckey Rutter, DNP, AGNP-C [place] at [facility] [phone] (office) 01/09/18 2:50 PM

## 2018-01-09 NOTE — Telephone Encounter (Addendum)
Returned call to patient at this time. She stated that she went to oncology to be seen for the pain that she was having. She stated that they told her it was inflammation of rib cage and started treating her with medication. She asked to keep her follow up appointment with Dr. Hampton Abbot. I verbalized understanding and advised patient to give our office a call if we could help her with anything prior to the appointment. She verbalized understanding.

## 2018-01-09 NOTE — Progress Notes (Signed)
Patient here today as acute add on for symptom management clinic. Patient reports she woke up this morning with sharp pain in her chest that was radiating to her back, denies any other symptoms.

## 2018-01-09 NOTE — Telephone Encounter (Signed)
Patient stated she called on Friday and needs some stool softners called in.

## 2018-01-09 NOTE — Telephone Encounter (Signed)
Left Message for patient to call back to verify if she wants to be seen tomorrow afternoon with Dr. Rosana Hoes.

## 2018-01-10 ENCOUNTER — Other Ambulatory Visit: Payer: Self-pay

## 2018-01-10 MED ORDER — LUBIPROSTONE 8 MCG PO CAPS: 8.0000 ug | ORAL_CAPSULE | Freq: Every day | ORAL | 3 refills | Status: DC

## 2018-01-10 NOTE — Telephone Encounter (Signed)
Pt stated Amitiza is working.  Rx has been faxed to pharmacy. She said depending on price she will decide between this and colace.  Thanks [employer]

## 2018-01-17 ENCOUNTER — Ambulatory Visit: Payer: Self-pay | Admitting: Surgery

## 2018-01-18 ENCOUNTER — Encounter: Payer: Self-pay | Admitting: Surgery

## 2018-01-18 ENCOUNTER — Ambulatory Visit (INDEPENDENT_AMBULATORY_CARE_PROVIDER_SITE_OTHER): Payer: Medicare Other | Admitting: Surgery

## 2018-01-18 VITALS — BP 158/83 | HR 66 | Temp 98.1°F | Ht 66.0 in | Wt 240.0 lb

## 2018-01-18 DIAGNOSIS — Z9013 Acquired absence of bilateral breasts and nipples: Secondary | ICD-10-CM | POA: Diagnosis not present

## 2018-01-18 DIAGNOSIS — C50911 Malignant neoplasm of unspecified site of right female breast: Secondary | ICD-10-CM

## 2018-01-18 NOTE — Progress Notes (Signed)
01/18/2018  History of Present Illness: Kaylee Reyes is a 67 y.o. female s/p bilateral mastectomy with sentinel node biopsy on 10/28/15 for right breast cancer with suspicious nodules on the left breast.  She's now two years out from surgery.  She's been followed by Dr. Grayland Ormond who last saw her on 09/19/17.  She was seen at the  [facility] on 01/09/18 with right sided back pain that radiated to the right chest.  This was reproducible on exam and was given NSAIDs for treatment management.  Patient reports that with the ibuprofen therapy, her pain has improved significantly and now is pretty much gone.  There was one moment where she also had pain on the left side to but that has gone away as well.  She believes the pain was more likely related to the bra that she wears that she uses for padding of her mastectomy sites.  Overall denies any new symptoms or feeling any lumps or bumps in her breasts.  She continues with her therapy with Dr. Grayland Ormond and is due to see Dr. Donella Stade soon as well.  Past Medical History: Past Medical History:  Diagnosis Date  . Anemia   . Cancer ([place]) 09/2015   breast cancer  . GERD (gastroesophageal reflux disease)   . History of chicken pox   . Hyperlipidemia   . Hypertension   . Hypothyroidism   . Neuropathy of foot    bilateral  . PONV (postoperative nausea and vomiting)    exploratory surgery at [facility]  . Thyroid disease   . Tuberculosis exposure    Mother had TB.  Pt shows (+) on tests, but has never had.  . Vertigo    last episode over 1 yr ago  . Wears dentures    partial upper and lower     Past Surgical History: Past Surgical History:  Procedure Laterality Date  . ABDOMINAL HYSTERECTOMY  07/13/2013   Total. with BSO for postmenopausal bleeding and fibroids with large cervical polyp. Dr. Levy Sjogren and Dr. Glennon Mac at [facility]  . ABDOMINAL ULTRASOUND  04/17/2011   Hepatomegaly with borderline slenomegaly. Suggestive of fatty liver.  s/p cholecystectomy. Portions of aorta obscured  . BREAST SURGERY Right 2011   BREAST BIOPSY  . CESAREAN SECTION  1988  . CHOLECYSTECTOMY  1990  . COLONOSCOPY WITH PROPOFOL N/A 09/01/2017   Procedure: COLONOSCOPY WITH PROPOFOL;  Surgeon: [name], MD;  Location: [facility];  Service: Gastroenterology;  Laterality: N/A;  . EXPLORATORY LAPAROTOMY     Left breast  . MASTECTOMY W/ SENTINEL NODE BIOPSY Bilateral 10/28/2015   Procedure: MASTECTOMY WITH SENTINEL LYMPH NODE BIOPSY;  Surgeon: Hubbard Robinson, MD;  Location: [facility];  Service: General;  Laterality: Bilateral;  . PORT-A-CATH REMOVAL Left 10/11/2016   Procedure: REMOVAL PORT-A-CATH;  Surgeon: Hubbard Robinson, MD;  Location: [facility];  Service: General;  Laterality: Left;  . PORTACATH PLACEMENT Left 12/23/2015   Procedure: INSERTION PORT-A-CATH;  Surgeon: Hubbard Robinson, MD;  Location: [facility];  Service: General;  Laterality: Left;  . WRIST SURGERY      Home Medications: Prior to Admission medications   Medication Sig Start Date End Date Taking? Authorizing Provider  atenolol (TENORMIN) 50 MG tablet Take 1 tablet (50 mg total) by mouth daily. 12/19/17   Birdie Sons, MD  B Complex-C (SUPER B COMPLEX PO) Take 1 tablet by mouth daily.    [provider]  calcium carbonate (OS-CAL) 600 MG TABS tablet Take 1  tablet (600 mg total) by mouth 2 (two) times daily with a meal. 12/15/16   Finnegan, Kathlene November, MD  Cholecalciferol (VITAMIN D3) 2000 units TABS Take 2,000 Units by mouth daily.    [provider]  cyclobenzaprine (FLEXERIL) 5 MG tablet TAKE 1 TABLET(5 MG) BY MOUTH THREE TIMES DAILY AS NEEDED FOR MUSCLE SPASMS 07/27/17   Birdie Sons, MD  docusate sodium (COLACE) 100 MG capsule Take 200 mg by mouth daily.     [provider]  hydrochlorothiazide (HYDRODIURIL) 25 MG tablet Take 1 tablet (25 mg total) by mouth daily. 12/19/17   Birdie Sons, MD  hydrocortisone (ANUSOL-HC) 2.5 %  rectal cream Place 1 application rectally 2 (two) times daily. 11/17/17   Trinna Post, PA-C  Hydrocortisone Micronized POWD RINSE AND SPIT 5ML BY MOUTH EVERY 3 TO 4 HOURS AS NEEDED 06/22/17   Birdie Sons, MD  ibuprofen (ADVIL,MOTRIN) 200 MG tablet Take 400 mg by mouth every 6 (six) hours as needed for mild pain.    [provider]  ketoconazole (NIZORAL) 2 % cream Apply 1 application topically daily. Patient not taking: Reported on 01/03/2018 10/29/16   Birdie Sons, MD  letrozole [facility]) 2.5 MG tablet Take 1 tablet (2.5 mg total) by mouth daily. 12/19/17   Birdie Sons, MD  levothyroxine (SYNTHROID, LEVOTHROID) 75 MCG tablet Take 1 tablet (75 mcg total) by mouth daily. 12/19/17   Birdie Sons, MD  Loratadine (CLARITIN) 10 MG CAPS Take by mouth.    [provider]  lubiprostone (AMITIZA) 8 MCG capsule Take 1 capsule (8 mcg total) by mouth daily. 01/10/18   Lin Landsman, MD  Magnesium 400 MG TABS Take 400 mg by mouth daily.    [provider]  meclizine (ANTIVERT) 25 MG tablet TAKE ONE TABLET BY MOUTH EVERY 4 TO 6 HOURS AS NEEDED FOR NAUSEA 05/25/16   Fisher, Kirstie Peri, MD  Misc Natural Products (TURMERIC CURCUMIN) CAPS Take 1 capsule by mouth daily.    [provider]    Allergies: Allergies  Allergen Reactions  . Penicillins Shortness Of Breath    Has patient had a PCN reaction causing immediate rash, facial/tongue/throat swelling, SOB or lightheadedness with hypotension: Yes Has patient had a PCN reaction causing severe rash involving mucus membranes or skin necrosis: No Has patient had a PCN reaction that required hospitalization No Has patient had a PCN reaction occurring within the last 10 years: No If all of the above answers are "NO", then may proceed with Cephalosporin use.   . Nickel Hives  . Pravastatin Sodium Other (See Comments)    Myalgia     Review of Systems: Review of Systems  Constitutional: Negative for  chills and fever.  Respiratory: Positive for shortness of breath (related to the previous right chest pain, now resolved).   Cardiovascular: Negative for chest pain.  Gastrointestinal: Negative for abdominal pain, nausea and vomiting.  Musculoskeletal: Positive for myalgias.    Physical Exam BP (!) 158/83   Pulse 66   Temp 98.1 F (36.7 C) (Oral)   Ht 5\' 6"  (1.676 m)   Wt 108.9 kg (240 lb)   BMI 38.74 kg/m  CONSTITUTIONAL: No acute distress RESPIRATORY:  Lungs are clear, and breath sounds are equal bilaterally. Normal respiratory effort without pathologic use of accessory muscles. CARDIOVASCULAR: Heart is regular without murmurs, gallops, or rubs. BREAST: Bilateral mastectomy scars with severe contraction and scarring from prior flap necrosis.  No new palpable masses or  lymphadenopathy. GI: The abdomen is soft, obese, non-distended, nontender to palpation. NEUROLOGIC:  Motor and sensation is grossly normal.  Cranial nerves are grossly intact. PSYCH:  Alert and oriented to person, place and time. Affect is normal.  Labs/Imaging: None recently  Assessment and Plan: This is a 67 y.o. female who presents for follow up, now s/p bilateral mastectomies and right sentinel node biopsy for right breast cancer.   Patient is overall doing well and her right chest pain is better now.  Discussed with patient to let us know if she develops any further pain issues.  The main concern at this point given her history would be if there is any recurrence of bone spread.  However, given her scars and contractions, it's not surprising to have some pain associated with them.   Will follow up with patient now yearly instead and she can follow up in one year.  No mammograms needed given the bilateral mastectomies.  Face-to-face time spent with the patient and care providers was 25 minutes, with more than 50% of the time spent counseling, educating, and coordinating care of the patient.     Melvyn Neth, [place]

## 2018-01-18 NOTE — Patient Instructions (Signed)
We will contact you in about 6 months with your annual follow up for your breast exam. Your annual foolow up will be around February 2020.  If you start to have pain that is constant and feel that nothing is helping please give our office a call.  If there is anything that we can further assist you with please give our office a call.

## 2018-01-20 ENCOUNTER — Ambulatory Visit (INDEPENDENT_AMBULATORY_CARE_PROVIDER_SITE_OTHER): Payer: Medicare Other | Admitting: Gastroenterology

## 2018-01-20 DIAGNOSIS — K641 Second degree hemorrhoids: Secondary | ICD-10-CM

## 2018-01-20 NOTE — Progress Notes (Signed)
PROCEDURE NOTE: The patient presents with symptomatic grade 2 hemorrhoids, unresponsive to maximal medical therapy, requesting rubber band ligation of his/her hemorrhoidal disease.  All risks, benefits and alternative forms of therapy were described and informed consent was obtained.  The decision was made to band the RP internal hemorrhoid, and the [place] was used to perform band ligation without complication, initial band did not grab enough tissue, therefore another band was deployed.  Digital anorectal examination was then performed to assure proper positioning of the band, and to adjust the banded tissue as required.  The patient was discharged home without pain or other issues.  Dietary and behavioral recommendations were given and (if necessary - prescriptions were given), along with follow-up instructions.  The patient will return 2 weeks for follow-up and possible additional banding as required.  No complications were encountered and the patient tolerated the procedure well.  Amitiza was expensive 100$ copay Lotrisone cream was 28$, replace with lotrimin  Given linzess 183mcg samples  Cephas Darby, MD [address]  [place]  [place], [place] [address]  Main: [phone]  Fax: [phone] Pager: [phone]

## 2018-01-23 ENCOUNTER — Telehealth: Payer: Self-pay | Admitting: Gastroenterology

## 2018-01-23 ENCOUNTER — Other Ambulatory Visit: Payer: Self-pay

## 2018-01-23 MED ORDER — CLOTRIMAZOLE 1 % EX CREA: 1.0000 "application " | TOPICAL_CREAM | Freq: Two times a day (BID) | CUTANEOUS | 0 refills | Status: DC

## 2018-01-23 NOTE — Telephone Encounter (Signed)
Patient called & l/m on answering machine stating that [facility] did not receive her prescription for cream.Please send in & call patient.

## 2018-01-24 ENCOUNTER — Telehealth: Payer: Self-pay | Admitting: Gastroenterology

## 2018-01-24 NOTE — Telephone Encounter (Signed)
Patient called in regards to Lotrimin cream.  Cost is $29.00 and she cant afford.  She doesn't have itching and wanted to know if she needs to use since she isnt having itching.  I explained to the patient that she can get the medication over the counter and it may be more affordable.  She will speak with the pharmacist and have them show her which medication it is.

## 2018-01-24 NOTE — Telephone Encounter (Signed)
Patient called & l/m wanting Dr Verlin Grills nurse to call her back.

## 2018-01-25 DIAGNOSIS — M542 Cervicalgia: Secondary | ICD-10-CM | POA: Diagnosis not present

## 2018-01-25 DIAGNOSIS — M9901 Segmental and somatic dysfunction of cervical region: Secondary | ICD-10-CM | POA: Diagnosis not present

## 2018-01-25 DIAGNOSIS — R202 Paresthesia of skin: Secondary | ICD-10-CM | POA: Diagnosis not present

## 2018-01-25 DIAGNOSIS — R200 Anesthesia of skin: Secondary | ICD-10-CM | POA: Diagnosis not present

## 2018-01-27 DIAGNOSIS — R200 Anesthesia of skin: Secondary | ICD-10-CM | POA: Diagnosis not present

## 2018-01-27 DIAGNOSIS — R202 Paresthesia of skin: Secondary | ICD-10-CM | POA: Diagnosis not present

## 2018-01-27 DIAGNOSIS — M542 Cervicalgia: Secondary | ICD-10-CM | POA: Diagnosis not present

## 2018-01-27 DIAGNOSIS — M9901 Segmental and somatic dysfunction of cervical region: Secondary | ICD-10-CM | POA: Diagnosis not present

## 2018-01-29 IMAGING — US US ASPIRATION
1 series · 9 of 9 positions shown · non-contrast
Comparison: none

INDICATION: Right anterior chest wall soft tissue abscess.

[Series 1: us aspiration · 0.07mm/px · 9 acquisitions, 9 frames shown]
[im 1/9]
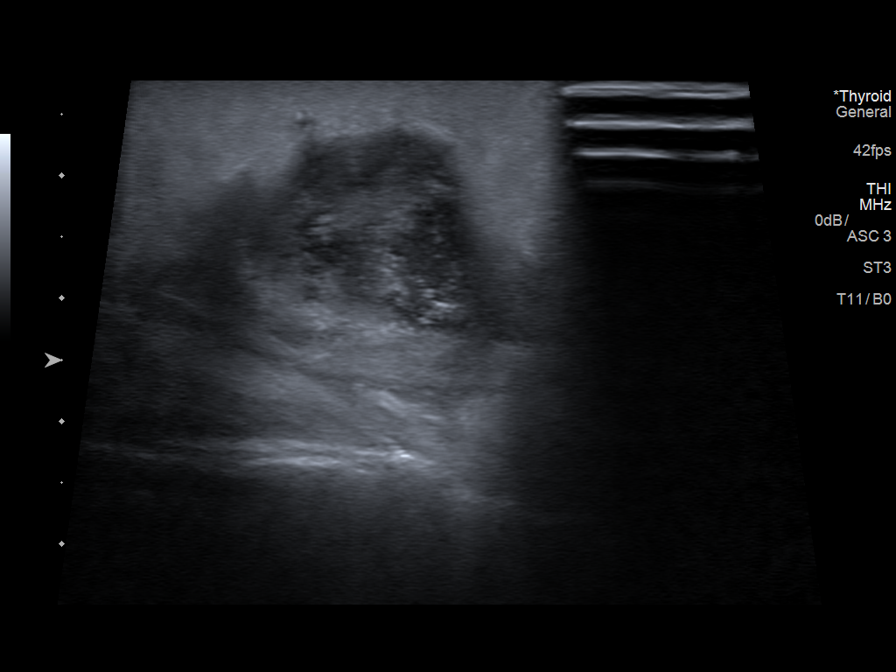
[im 2/9]
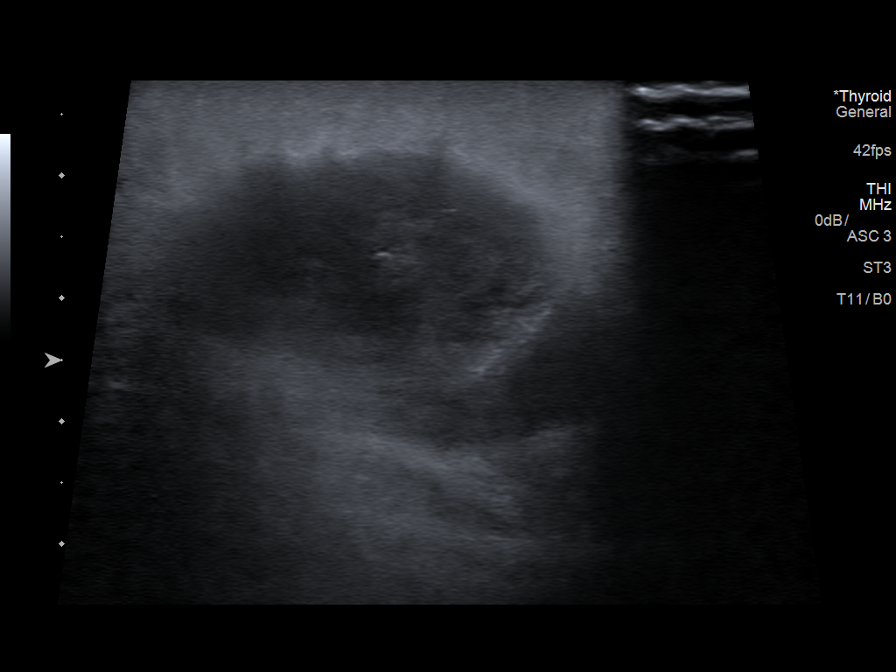
[im 3/9]
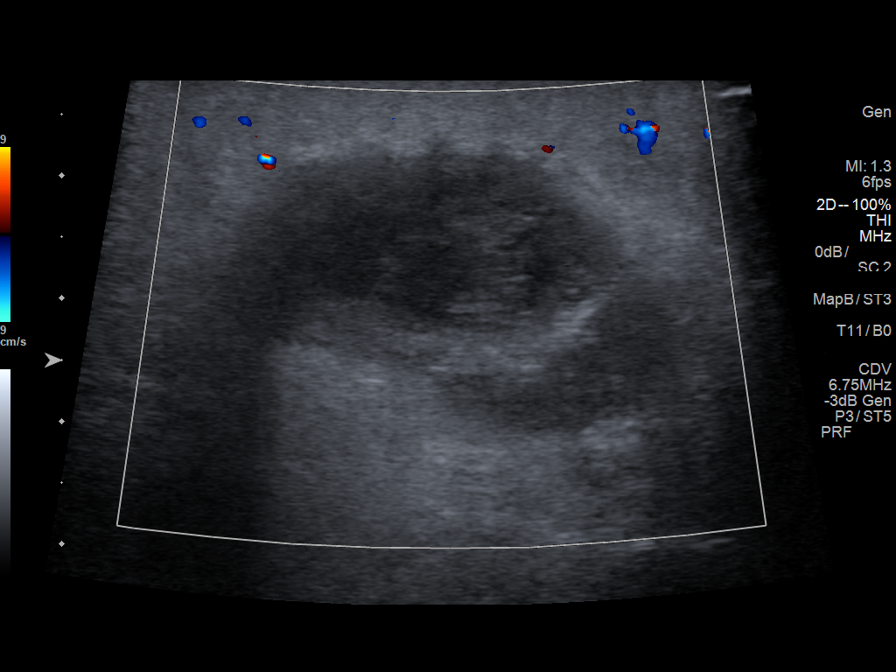
[im 4/9]
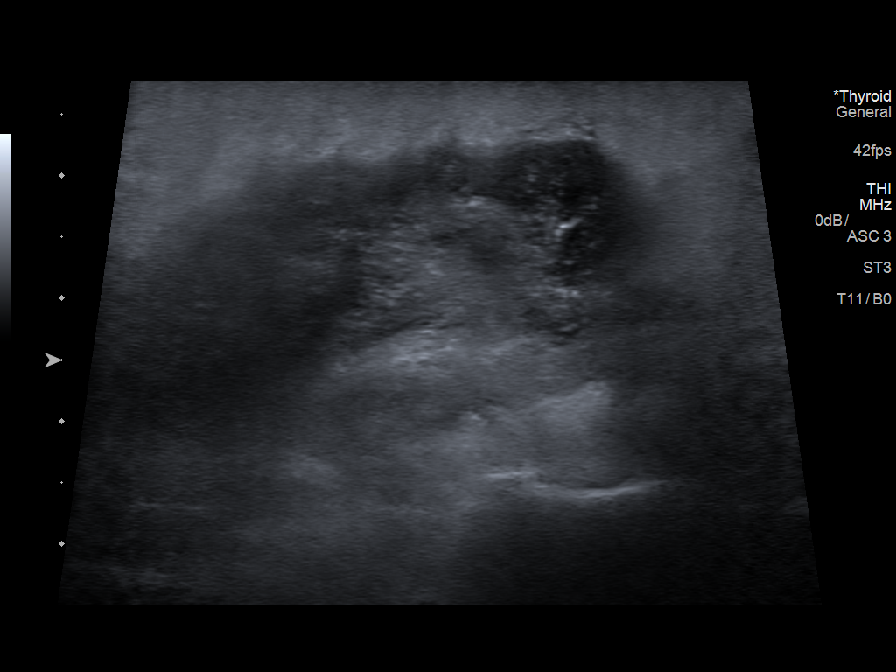
[im 5/9]
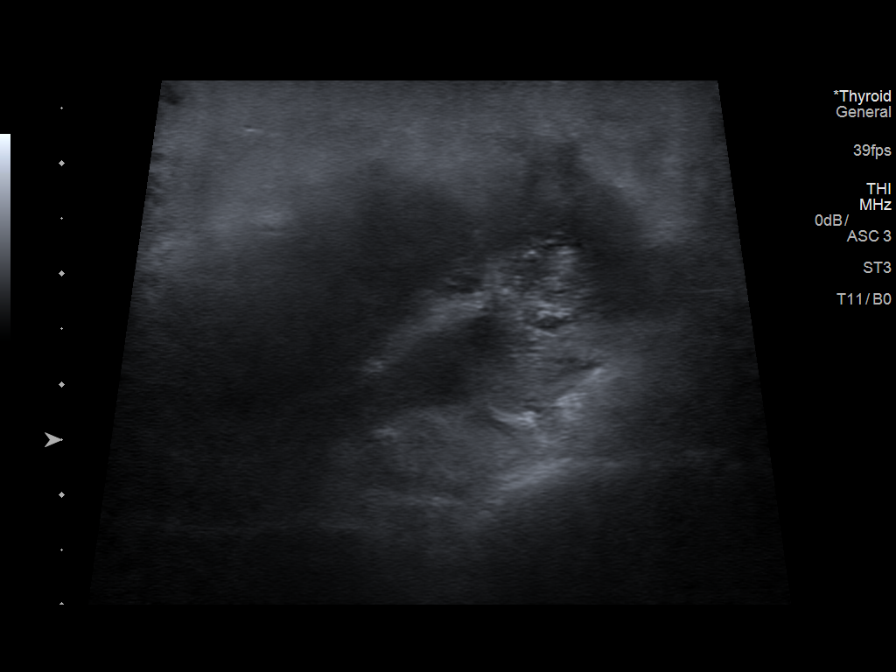
[im 6/9]
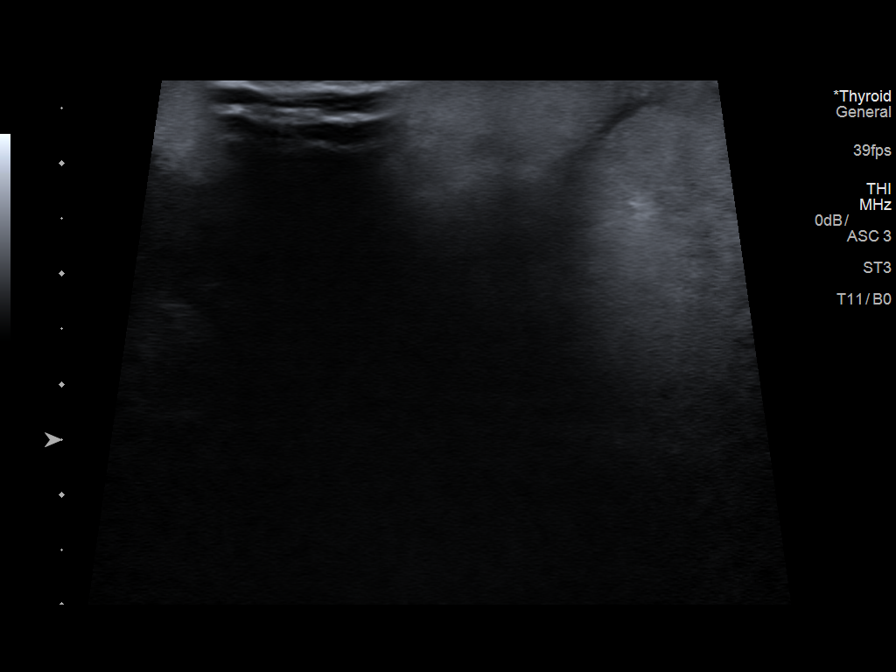
[im 7/9]
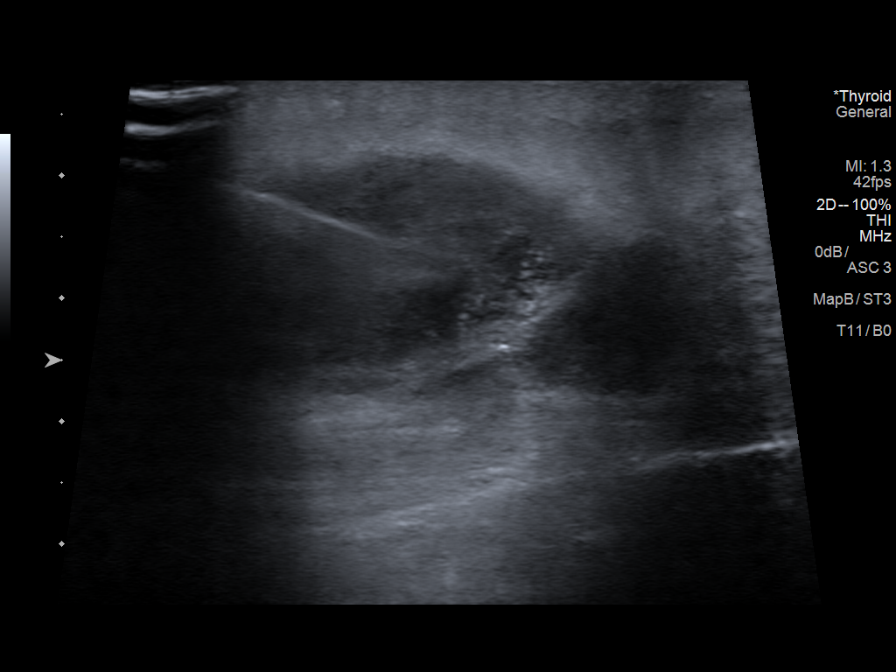
[im 8/9]
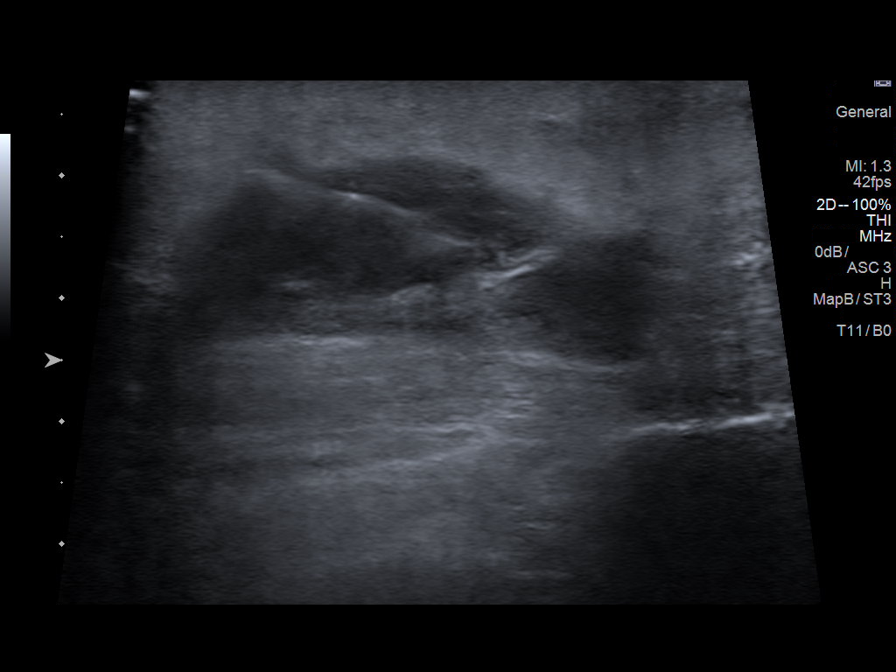
[im 9/9]
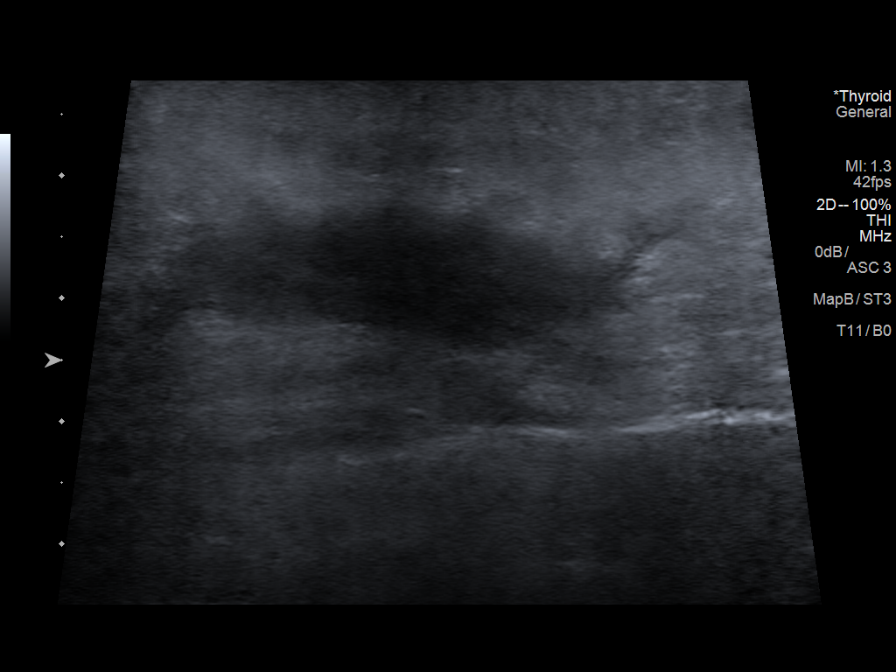

[9 of 9 positions shown; findings below may reference images not displayed]

EXAM:
US ASPIRATION

MEDICATIONS:
1% lidocaine locally.

ANESTHESIA/SEDATION:
Fentanyl 50 mcg IV; Versed 1 mg IV

Moderate Sedation Time:  6

The patient was continuously monitored during the procedure by the
interventional radiology nurse under my direct supervision.

COMPLICATIONS:
None immediate.

PROCEDURE:
Informed written consent was obtained from the patient after a
thorough discussion of the procedural risks, benefits and
alternatives. All questions were addressed. Maximal Sterile Barrier
Technique was utilized including caps, mask, sterile gowns, sterile
gloves, sterile drape, hand hygiene and skin antiseptic. A timeout
was performed prior to the initiation of the procedure.

Previous imaging reviewed. Preliminary ultrasound performed of the
right anterior chest abnormality. A heterogeneous subcutaneous fluid
collection is demonstrated. This correlates with the CT.

Under sterile conditions and local anesthesia, an 18 gauge needle
was advanced under ultrasound into the chest wall subcutaneous
collection. Syringe aspiration yielded 10 cc purulent fluid
compatible with an abscess. Syringe aspiration completely
decompressed the small abscess. Sample sent for cytology and
culture. Needle removed. Patient tolerated the procedure well. No
immediate complication.
IMPRESSION: Successful ultrasound anterior chest wall abscess aspiration.
Cytology and culture sent.

These results were called by telephone at the time of interpretation
on 05/12/2016 at [DATE] to Dr. VONTE ROCIO , who verbally
acknowledged these results.

## 2018-01-30 DIAGNOSIS — M9901 Segmental and somatic dysfunction of cervical region: Secondary | ICD-10-CM | POA: Diagnosis not present

## 2018-01-30 DIAGNOSIS — M542 Cervicalgia: Secondary | ICD-10-CM | POA: Diagnosis not present

## 2018-01-30 DIAGNOSIS — R202 Paresthesia of skin: Secondary | ICD-10-CM | POA: Diagnosis not present

## 2018-01-30 DIAGNOSIS — R200 Anesthesia of skin: Secondary | ICD-10-CM | POA: Diagnosis not present

## 2018-02-01 DIAGNOSIS — R200 Anesthesia of skin: Secondary | ICD-10-CM | POA: Diagnosis not present

## 2018-02-01 DIAGNOSIS — M9901 Segmental and somatic dysfunction of cervical region: Secondary | ICD-10-CM | POA: Diagnosis not present

## 2018-02-01 DIAGNOSIS — M542 Cervicalgia: Secondary | ICD-10-CM | POA: Diagnosis not present

## 2018-02-01 DIAGNOSIS — R202 Paresthesia of skin: Secondary | ICD-10-CM | POA: Diagnosis not present

## 2018-02-03 ENCOUNTER — Ambulatory Visit (INDEPENDENT_AMBULATORY_CARE_PROVIDER_SITE_OTHER): Payer: Medicare Other | Admitting: Gastroenterology

## 2018-02-03 ENCOUNTER — Encounter: Payer: Self-pay | Admitting: Gastroenterology

## 2018-02-03 VITALS — BP 159/84 | HR 65 | Temp 97.6°F | Ht 66.0 in | Wt 238.2 lb

## 2018-02-03 DIAGNOSIS — K641 Second degree hemorrhoids: Secondary | ICD-10-CM

## 2018-02-03 DIAGNOSIS — R200 Anesthesia of skin: Secondary | ICD-10-CM | POA: Diagnosis not present

## 2018-02-03 DIAGNOSIS — R202 Paresthesia of skin: Secondary | ICD-10-CM | POA: Diagnosis not present

## 2018-02-03 DIAGNOSIS — M542 Cervicalgia: Secondary | ICD-10-CM | POA: Diagnosis not present

## 2018-02-03 DIAGNOSIS — M9901 Segmental and somatic dysfunction of cervical region: Secondary | ICD-10-CM | POA: Diagnosis not present

## 2018-02-03 NOTE — Progress Notes (Signed)
PROCEDURE NOTE: The patient presents with symptomatic grade 2 hemorrhoids, unresponsive to maximal medical therapy, requesting rubber band ligation of his/her hemorrhoidal disease.  All risks, benefits and alternative forms of therapy were described and informed consent was obtained.  The decision was made to band the LL internal hemorrhoid, and the Mucarabones was used to perform band ligation without complication.  Digital anorectal examination was then performed to assure proper positioning of the band, and to adjust the banded tissue as required.  The patient was discharged home without pain or other issues.  Dietary and behavioral recommendations were given and (if necessary - prescriptions were given), along with follow-up instructions.  The patient will return 2 months for follow-up and possible additional banding as required.  RP and RA were previously banded.  No complications were encountered and the patient tolerated the procedure well.  Cephas Darby, MD [address]  [place]  [place], [place] [address]  Main: [phone]  Fax: [phone] Pager: [phone]

## 2018-02-06 DIAGNOSIS — M542 Cervicalgia: Secondary | ICD-10-CM | POA: Diagnosis not present

## 2018-02-06 DIAGNOSIS — R202 Paresthesia of skin: Secondary | ICD-10-CM | POA: Diagnosis not present

## 2018-02-06 DIAGNOSIS — M9901 Segmental and somatic dysfunction of cervical region: Secondary | ICD-10-CM | POA: Diagnosis not present

## 2018-02-06 DIAGNOSIS — R200 Anesthesia of skin: Secondary | ICD-10-CM | POA: Diagnosis not present

## 2018-02-10 DIAGNOSIS — R202 Paresthesia of skin: Secondary | ICD-10-CM | POA: Diagnosis not present

## 2018-02-10 DIAGNOSIS — R200 Anesthesia of skin: Secondary | ICD-10-CM | POA: Diagnosis not present

## 2018-02-10 DIAGNOSIS — M542 Cervicalgia: Secondary | ICD-10-CM | POA: Diagnosis not present

## 2018-02-10 DIAGNOSIS — M9901 Segmental and somatic dysfunction of cervical region: Secondary | ICD-10-CM | POA: Diagnosis not present

## 2018-02-13 DIAGNOSIS — R200 Anesthesia of skin: Secondary | ICD-10-CM | POA: Diagnosis not present

## 2018-02-13 DIAGNOSIS — M542 Cervicalgia: Secondary | ICD-10-CM | POA: Diagnosis not present

## 2018-02-13 DIAGNOSIS — M9901 Segmental and somatic dysfunction of cervical region: Secondary | ICD-10-CM | POA: Diagnosis not present

## 2018-02-13 DIAGNOSIS — R202 Paresthesia of skin: Secondary | ICD-10-CM | POA: Diagnosis not present

## 2018-02-17 DIAGNOSIS — R200 Anesthesia of skin: Secondary | ICD-10-CM | POA: Diagnosis not present

## 2018-02-17 DIAGNOSIS — M9901 Segmental and somatic dysfunction of cervical region: Secondary | ICD-10-CM | POA: Diagnosis not present

## 2018-02-17 DIAGNOSIS — R202 Paresthesia of skin: Secondary | ICD-10-CM | POA: Diagnosis not present

## 2018-02-17 DIAGNOSIS — M542 Cervicalgia: Secondary | ICD-10-CM | POA: Diagnosis not present

## 2018-02-20 DIAGNOSIS — M542 Cervicalgia: Secondary | ICD-10-CM | POA: Diagnosis not present

## 2018-02-20 DIAGNOSIS — R202 Paresthesia of skin: Secondary | ICD-10-CM | POA: Diagnosis not present

## 2018-02-20 DIAGNOSIS — R200 Anesthesia of skin: Secondary | ICD-10-CM | POA: Diagnosis not present

## 2018-02-20 DIAGNOSIS — M9901 Segmental and somatic dysfunction of cervical region: Secondary | ICD-10-CM | POA: Diagnosis not present

## 2018-02-24 DIAGNOSIS — M9901 Segmental and somatic dysfunction of cervical region: Secondary | ICD-10-CM | POA: Diagnosis not present

## 2018-02-24 DIAGNOSIS — R200 Anesthesia of skin: Secondary | ICD-10-CM | POA: Diagnosis not present

## 2018-02-24 DIAGNOSIS — R202 Paresthesia of skin: Secondary | ICD-10-CM | POA: Diagnosis not present

## 2018-02-24 DIAGNOSIS — M542 Cervicalgia: Secondary | ICD-10-CM | POA: Diagnosis not present

## 2018-02-27 DIAGNOSIS — R200 Anesthesia of skin: Secondary | ICD-10-CM | POA: Diagnosis not present

## 2018-02-27 DIAGNOSIS — M542 Cervicalgia: Secondary | ICD-10-CM | POA: Diagnosis not present

## 2018-02-27 DIAGNOSIS — R202 Paresthesia of skin: Secondary | ICD-10-CM | POA: Diagnosis not present

## 2018-02-27 DIAGNOSIS — M9901 Segmental and somatic dysfunction of cervical region: Secondary | ICD-10-CM | POA: Diagnosis not present

## 2018-03-03 DIAGNOSIS — M542 Cervicalgia: Secondary | ICD-10-CM | POA: Diagnosis not present

## 2018-03-03 DIAGNOSIS — R202 Paresthesia of skin: Secondary | ICD-10-CM | POA: Diagnosis not present

## 2018-03-03 DIAGNOSIS — R200 Anesthesia of skin: Secondary | ICD-10-CM | POA: Diagnosis not present

## 2018-03-03 DIAGNOSIS — M9901 Segmental and somatic dysfunction of cervical region: Secondary | ICD-10-CM | POA: Diagnosis not present

## 2018-03-10 DIAGNOSIS — M542 Cervicalgia: Secondary | ICD-10-CM | POA: Diagnosis not present

## 2018-03-10 DIAGNOSIS — R200 Anesthesia of skin: Secondary | ICD-10-CM | POA: Diagnosis not present

## 2018-03-10 DIAGNOSIS — M9901 Segmental and somatic dysfunction of cervical region: Secondary | ICD-10-CM | POA: Diagnosis not present

## 2018-03-13 ENCOUNTER — Encounter: Payer: Self-pay | Admitting: Family Medicine

## 2018-03-15 DIAGNOSIS — R200 Anesthesia of skin: Secondary | ICD-10-CM | POA: Diagnosis not present

## 2018-03-15 DIAGNOSIS — M542 Cervicalgia: Secondary | ICD-10-CM | POA: Diagnosis not present

## 2018-03-15 DIAGNOSIS — M9901 Segmental and somatic dysfunction of cervical region: Secondary | ICD-10-CM | POA: Diagnosis not present

## 2018-03-19 NOTE — Progress Notes (Signed)
[facility]  [phone] Fax:(336) [phone]  ID: Jaquita Folds OB: 1951/03/28  MR#: [id number]  RWE#:[id number]  Patient Care Team: Birdie Sons, MD as PCP - General (Family Medicine) [name], MD as Consulting Physician (Gastroenterology) Hubbard Robinson, MD as Consulting Physician (Surgery)  CHIEF COMPLAINT: Pathologic stage IIB ER/PR positive invasive lobular carcinoma of the right upper breast.  INTERVAL HISTORY: Patient returns to clinic today for routine six-month evaluation.  She is tolerating letrozole well without significant side effects.  She no longer complains of hot flashes and her knee pain has resolved with intentional weight loss.  She continues to have a mild peripheral neuropathy is mildly improved. She has no other neurologic complaints. She denies any recent fevers or illnesses. She denies any other pain. She has a good appetite and denies weight loss. She denies any chest pain or shortness of breath.. She has no nausea, vomiting, constipation, or diarrhea. She has no urinary complaints. Patient offers no specific complaints today.  REVIEW OF SYSTEMS:   Review of Systems  Constitutional: Positive for weight loss. Negative for fever and malaise/fatigue.  Respiratory: Negative for cough and shortness of breath.   Cardiovascular: Negative.  Negative for chest pain and leg swelling.  Gastrointestinal: Negative.  Negative for abdominal pain and constipation.  Genitourinary: Negative.   Musculoskeletal: Negative.  Negative for back pain and joint pain.  Skin: Negative.  Negative for rash.  Neurological: Positive for sensory change. Negative for weakness.  Psychiatric/Behavioral: Negative.  Negative for memory loss. The patient is not nervous/anxious.     As per HPI. Otherwise, a complete review of systems is negative.  PAST MEDICAL HISTORY: Past Medical History:  Diagnosis Date  . Anemia   . Cancer ([place]) 09/2015   breast  cancer  . GERD (gastroesophageal reflux disease)   . History of chicken pox   . Hyperlipidemia   . Hypertension   . Hypothyroidism   . Neuropathy of foot    bilateral  . PONV (postoperative nausea and vomiting)    explor[facility] surgery at [facility]  . Thyroid disease   . Tuberculosis exposure    Mother had TB.  Pt shows (+) on tests, but has never had.  . Vertigo    last episode over 1 yr ago  . Wears dentures    partial upper and lower    PAST SURGICAL HISTORY: Past Surgical History:  Procedure Laterality Date  . ABDOMINAL HYSTERECTOMY  07/13/2013   Total. with BSO for postmenopausal bleeding and fibroids with large cervical polyp. Dr. Levy Sjogren and Dr. Glennon Mac at [facility]  . ABDOMINAL ULTRASOUND  04/17/2011   Hepatomegaly with borderline slenomegaly. Suggestive of fatty liver. s/p cholecystectomy. Portions of aorta obscured  . BREAST SURGERY Right 2011   BREAST BIOPSY  . CESAREAN SECTION  1988  . CHOLECYSTECTOMY  1990  . COLONOSCOPY WITH PROPOFOL N/A 09/01/2017   Procedure: COLONOSCOPY WITH PROPOFOL;  Surgeon: [name], MD;  Location: [facility];  Service: Gastroenterology;  Laterality: N/A;  . EXPLORATORY LAPAROTOMY     Left breast  . MASTECTOMY W/ SENTINEL NODE BIOPSY Bilateral 10/28/2015   Procedure: MASTECTOMY WITH SENTINEL LYMPH NODE BIOPSY;  Surgeon: Hubbard Robinson, MD;  Location: [facility];  Service: General;  Laterality: Bilateral;  . PORT-A-CATH REMOVAL Left 10/11/2016   Procedure: REMOVAL PORT-A-CATH;  Surgeon: Hubbard Robinson, MD;  Location: [facility];  Service: General;  Laterality: Left;  . PORTACATH PLACEMENT Left 12/23/2015   Procedure: INSERTION PORT-A-CATH;  Surgeon:  Hubbard Robinson, MD;  Location: [facility];  Service: General;  Laterality: Left;  . WRIST SURGERY      FAMILY HISTORY Family History  Problem Relation Age of Onset  . Hypertension Mother   . Thyroid disease Mother   . Hypertension Brother   . Diabetes Brother   . Breast cancer Neg  Hx   . Ovarian cancer Neg Hx   . Colon cancer Neg Hx        ADVANCED DIRECTIVES:    HEALTH MAINTENANCE: Social History   Tobacco Use  . Smoking status: Never Smoker  . Smokeless tobacco: Never Used  Substance Use Topics  . Alcohol use: No    Alcohol/week: 0.0 oz  . Drug use: No     Colonoscopy:  PAP:  Bone density:  Lipid panel:  Allergies  Allergen Reactions  . Penicillins Shortness Of Breath    Has patient had a PCN reaction causing immediate rash, facial/tongue/throat swelling, SOB or lightheadedness with hypotension: Yes Has patient had a PCN reaction causing severe rash involving mucus membranes or skin necrosis: No Has patient had a PCN reaction that required hospitalization No Has patient had a PCN reaction occurring within the last 10 years: No If all of the above answers are "NO", then may proceed with Cephalosporin use.   . Nickel Hives  . Pravastatin Sodium Other (See Comments)    Myalgia     Current Outpatient Medications  Medication Sig Dispense Refill  . atenolol (TENORMIN) 50 MG tablet Take 1 tablet (50 mg total) by mouth daily. 90 tablet 4  . B Complex-C (SUPER B COMPLEX PO) Take 1 tablet by mouth daily.    . calcium carbonate (OS-CAL) 600 MG TABS tablet Take 1 tablet (600 mg total) by mouth 2 (two) times daily with a meal. 60 tablet 5  . Cholecalciferol (VITAMIN D3) 2000 units TABS Take 2,000 Units by mouth daily.    . clotrimazole (LOTRIMIN AF) 1 % cream Apply 1 application topically 2 (two) times daily. 30 g 0  . cyclobenzaprine (FLEXERIL) 5 MG tablet TAKE 1 TABLET(5 MG) BY MOUTH THREE TIMES DAILY AS NEEDED FOR MUSCLE SPASMS 30 tablet 5  . docusate sodium (COLACE) 100 MG capsule Take 200 mg by mouth daily.     . hydrochlorothiazide (HYDRODIURIL) 25 MG tablet Take 1 tablet (25 mg total) by mouth daily. 90 tablet 4  . hydrocortisone (ANUSOL-HC) 2.5 % rectal cream Place 1 application rectally 2 (two) times daily. 30 g 0  . Hydrocortisone Micronized  POWD RINSE AND SPIT 5ML BY MOUTH EVERY 3 TO 4 HOURS AS NEEDED 1 Bottle 4  . ibuprofen (ADVIL,MOTRIN) 200 MG tablet Take 400 mg by mouth every 6 (six) hours as needed for mild pain.    Marland Kitchen ketoconazole (NIZORAL) 2 % cream Apply 1 application topically daily. 50 g 3  . letrozole (FEMARA) 2.5 MG tablet Take 1 tablet (2.5 mg total) by mouth daily. 90 tablet 3  . levothyroxine (SYNTHROID, LEVOTHROID) 75 MCG tablet Take 1 tablet (75 mcg total) by mouth daily. 90 tablet 3  . Loratadine (CLARITIN) 10 MG CAPS Take by mouth.    . lubiprostone (AMITIZA) 8 MCG capsule Take 1 capsule (8 mcg total) by mouth daily. 30 capsule 3  . Magnesium 400 MG TABS Take 400 mg by mouth daily.    . meclizine (ANTIVERT) 25 MG tablet TAKE ONE TABLET BY MOUTH EVERY 4 TO 6 HOURS AS NEEDED FOR NAUSEA 30 tablet 3  . Misc Natural  Products (TURMERIC CURCUMIN) CAPS Take 1 capsule by mouth daily.     No current facility-administered medications for this visit.    Facility-Administered Medications Ordered in Other Visits  Medication Dose Route Frequency Provider Last Rate Last Dose  . sodium chloride flush (NS) 0.9 % injection 10 mL  10 mL Intravenous PRN Lloyd Huger, MD   10 mL at 02/09/16 0930  . sodium chloride flush (NS) 0.9 % injection 10 mL  10 mL Intravenous PRN Lloyd Huger, MD   10 mL at 06/21/16 0950    OBJECTIVE: Vitals:   03/20/18 1354 03/20/18 1358  BP:  129/84  Pulse:  63  Resp: 12   Temp:  (!) 97.2 F (36.2 C)     Body mass index is 37.78 kg/m.    ECOG FS:0 - Asymptomatic  General: Well-developed, well-nourished, no acute distress. Eyes: Pink conjunctiva, anicteric sclera. Breasts: Bilateral mastectomy. Lungs: Clear to auscultation bilaterally. Heart: Regular rate and rhythm. No rubs, murmurs, or gallops. Abdomen: Soft, nontender, nondistended. No organomegaly noted, normoactive bowel sounds. Musculoskeletal: No edema, cyanosis, or clubbing. Neuro: Alert, answering all questions  appropriately. Cranial nerves grossly intact. Skin: No rashes or petechiae noted. Psych: Normal affect.  LAB RESULTS:  Lab Results  Component Value Date   NA 143 04/04/2017   K 4.6 04/04/2017   CL 99 04/04/2017   CO2 27 04/04/2017   GLUCOSE 111 (H) 04/04/2017   BUN 12 04/04/2017   CREATININE 0.84 04/04/2017   CALCIUM 9.9 04/04/2017   PROT 7.6 04/04/2017   ALBUMIN 4.2 04/04/2017   AST 27 04/04/2017   ALT 29 04/04/2017   ALKPHOS 108 04/04/2017   BILITOT 0.4 04/04/2017   GFRNONAA 74 04/04/2017   GFRAA 85 04/04/2017    Lab Results  Component Value Date   WBC 8.1 04/04/2017   NEUTROABS 6.4 04/04/2017   HGB 11.6 04/04/2017   HCT 34.1 04/04/2017   MCV 83 04/04/2017   PLT 219 04/04/2017   Lab Results  Component Value Date   IRON 48 02/09/2016   TIBC 326 02/09/2016   IRONPCTSAT 15 02/09/2016     STUDIES: No results found.  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  PLAN:    1. Pathologic stage IIB ER/PR positive invasive lobular carcinoma of the right upper breast: Patient is now status post bilateral mastectomy, therefore does not require additional mammograms. She completed 4 cycles of Taxotere and Cytoxan on March 03, 2016.  She also completed adjuvant XRT.  Continue letrozole for a total of 5 years completing in July 2022.  Baseline bone mineral density on July 29, 2016 reported a T score of -0.8 which is considered normal. Repeat in August 2019. Return to clinic in 6 months for further evaluation.  2. Hot flashes: Improved. 3.  Weight loss: Intentional.  Approximately 20 minutes was spent in discussion of which greater than 50% was consultation.  Patient expressed understanding and was in agreement with this plan. She also understands that She can call clinic at any time with any questions, concerns, or complaints.   Invasive lobular carcinoma of right breast, stage 2 ([place])   Staging form: Breast,  AJCC 7th Edition     Pathologic stage: Stage IIB (T2, N1a, M0) - Signed by Lloyd Huger, MD on 10/22/2015   Lloyd Huger, MD   03/21/2018 9:10 AM

## 2018-03-20 ENCOUNTER — Telehealth: Payer: Self-pay | Admitting: Family Medicine

## 2018-03-20 ENCOUNTER — Inpatient Hospital Stay: Payer: Medicare Other | Attending: Oncology | Admitting: Oncology

## 2018-03-20 ENCOUNTER — Encounter: Payer: Self-pay | Admitting: Oncology

## 2018-03-20 ENCOUNTER — Other Ambulatory Visit: Payer: Self-pay

## 2018-03-20 VITALS — BP 129/84 | HR 63 | Temp 97.2°F | Resp 12 | Ht 66.0 in | Wt 234.1 lb

## 2018-03-20 DIAGNOSIS — I10 Essential (primary) hypertension: Secondary | ICD-10-CM | POA: Insufficient documentation

## 2018-03-20 DIAGNOSIS — G629 Polyneuropathy, unspecified: Secondary | ICD-10-CM | POA: Insufficient documentation

## 2018-03-20 DIAGNOSIS — Z79811 Long term (current) use of aromatase inhibitors: Secondary | ICD-10-CM

## 2018-03-20 DIAGNOSIS — Z170 Estrogen receptor positive status [ER+]: Secondary | ICD-10-CM | POA: Diagnosis not present

## 2018-03-20 DIAGNOSIS — E039 Hypothyroidism, unspecified: Secondary | ICD-10-CM | POA: Diagnosis not present

## 2018-03-20 DIAGNOSIS — Z9013 Acquired absence of bilateral breasts and nipples: Secondary | ICD-10-CM | POA: Diagnosis not present

## 2018-03-20 DIAGNOSIS — Z9221 Personal history of antineoplastic chemotherapy: Secondary | ICD-10-CM | POA: Insufficient documentation

## 2018-03-20 DIAGNOSIS — K219 Gastro-esophageal reflux disease without esophagitis: Secondary | ICD-10-CM | POA: Insufficient documentation

## 2018-03-20 DIAGNOSIS — Z79899 Other long term (current) drug therapy: Secondary | ICD-10-CM | POA: Diagnosis not present

## 2018-03-20 DIAGNOSIS — C50911 Malignant neoplasm of unspecified site of right female breast: Secondary | ICD-10-CM | POA: Diagnosis not present

## 2018-03-20 DIAGNOSIS — Z923 Personal history of irradiation: Secondary | ICD-10-CM | POA: Diagnosis not present

## 2018-03-20 DIAGNOSIS — E785 Hyperlipidemia, unspecified: Secondary | ICD-10-CM | POA: Diagnosis not present

## 2018-03-20 NOTE — Telephone Encounter (Signed)
Sleeve is to help prevent lymphedema. Patient stated order can be faxed if possible. Patient would like to be notified either way. Please advise?

## 2018-03-20 NOTE — Telephone Encounter (Signed)
Patient states that she needs a prescription for a sleeve from her under arm to her wrist.  She does not know the official name for this.  She states that the material is like the material of a compression sock.  She will be taking this to [facility].  The number there is [phone].  She states that you could talk to Helene Kelp there if you have any questions.  Please call patient when ready for pick up.

## 2018-03-21 NOTE — Telephone Encounter (Signed)
Called the pharmacy and she reports that it usually says: Supply compression sleeve 20-69mmHg for (right or left) arm to wear daily.

## 2018-03-21 NOTE — Telephone Encounter (Signed)
I'm not sure how they need this prescription written, please call [facility] and see how they need this order written, thanks.

## 2018-03-22 DIAGNOSIS — M9901 Segmental and somatic dysfunction of cervical region: Secondary | ICD-10-CM | POA: Diagnosis not present

## 2018-03-22 DIAGNOSIS — R200 Anesthesia of skin: Secondary | ICD-10-CM | POA: Diagnosis not present

## 2018-03-22 DIAGNOSIS — M542 Cervicalgia: Secondary | ICD-10-CM | POA: Diagnosis not present

## 2018-03-22 NOTE — Telephone Encounter (Signed)
Pt stated that she went to [facility] today to get measured for the sleeve and was advised that her insurance wouldn't cover this. Pt stated since insurance won't cover she no longer needs the order. Please advise. Thanks TNP

## 2018-03-23 ENCOUNTER — Ambulatory Visit: Payer: Medicare Other | Attending: Family Medicine

## 2018-03-23 ENCOUNTER — Other Ambulatory Visit: Payer: Self-pay

## 2018-03-23 DIAGNOSIS — M6281 Muscle weakness (generalized): Secondary | ICD-10-CM | POA: Diagnosis not present

## 2018-03-23 DIAGNOSIS — G8929 Other chronic pain: Secondary | ICD-10-CM | POA: Insufficient documentation

## 2018-03-23 DIAGNOSIS — M25511 Pain in right shoulder: Secondary | ICD-10-CM | POA: Diagnosis not present

## 2018-03-23 NOTE — Patient Instructions (Signed)
    Scapular Retraction (sitting)   With arms at sides, pinch shoulder blades together.  Hold for 5 seconds.  Repeat __10__ times per set. Do __3__ sets per session.     Copyright  VHI. All rights reserved.

## 2018-03-23 NOTE — Therapy (Signed)
[facility] [address] [place], [address] Phone: [phone]   Fax:  [phone]  Physical Therapy Evaluation  Patient Details  Name: Kaylee Reyes MRN: [id number] Date of Birth: Dec 16, 1950 Referring Provider: Lelon Huh, MD   Encounter Date: 03/23/2018  PT End of Session - 03/23/18 1305    Visit Number  1    Number of Visits  13    Date for PT Re-Evaluation  05/04/18    PT Start Time  2836    PT Stop Time  1414    PT Time Calculation (min)  69 min    Activity Tolerance  Patient tolerated treatment well    Behavior During Therapy  [facility] for tasks assessed/performed       Past Medical History:  Diagnosis Date  . Anemia   . Cancer ([place]) 09/2015   breast cancer  . GERD (gastroesophageal reflux disease)   . History of chicken pox   . Hyperlipidemia   . Hypertension   . Hypothyroidism   . Neuropathy of foot    bilateral  . PONV (postoperative nausea and vomiting)    exploratory surgery at [facility]  . Thyroid disease   . Tuberculosis exposure    Mother had TB.  Pt shows (+) on tests, but has never had.  . Vertigo    last episode over 1 yr ago  . Wears dentures    partial upper and lower    Past Surgical History:  Procedure Laterality Date  . ABDOMINAL HYSTERECTOMY  07/13/2013   Total. with BSO for postmenopausal bleeding and fibroids with large cervical polyp. Dr. Levy Sjogren and Dr. Glennon Mac at [facility]  . ABDOMINAL ULTRASOUND  04/17/2011   Hepatomegaly with borderline slenomegaly. Suggestive of fatty liver. s/p cholecystectomy. Portions of aorta obscured  . BREAST SURGERY Right 2011   BREAST BIOPSY  . CESAREAN SECTION  1988  . CHOLECYSTECTOMY  1990  . COLONOSCOPY WITH PROPOFOL N/A 09/01/2017   Procedure: COLONOSCOPY WITH PROPOFOL;  Surgeon: [name], MD;  Location: [place];  Service: Gastroenterology;  Laterality: N/A;  . EXPLORATORY LAPAROTOMY     Left breast  . MASTECTOMY W/  SENTINEL NODE BIOPSY Bilateral 10/28/2015   Procedure: MASTECTOMY WITH SENTINEL LYMPH NODE BIOPSY;  Surgeon: Hubbard Robinson, MD;  Location: [facility];  Service: General;  Laterality: Bilateral;  . PORT-A-CATH REMOVAL Left 10/11/2016   Procedure: REMOVAL PORT-A-CATH;  Surgeon: Hubbard Robinson, MD;  Location: [facility];  Service: General;  Laterality: Left;  . PORTACATH PLACEMENT Left 12/23/2015   Procedure: INSERTION PORT-A-CATH;  Surgeon: Hubbard Robinson, MD;  Location: [facility];  Service: General;  Laterality: Left;  . WRIST SURGERY      There were no vitals filed for this visit.   Subjective Assessment - 03/23/18 1307    Subjective  R shoulder pain: 0/10 currently (pt not moving her shoulder), 6/10 when moving her neck. 8/10 at most for the past 2 months    Pertinent History  R shoulder pain. Pt states she does not want to do anything to her neck because her chiropractor is currently working on it. Pt had bilatral mastectomy in 2016 and went through chemo and radiation. Feels like her R shoulder pain came from the radiation. Pt states having a tendency to have rounded shoulders which could affect her shoulder.  R shoulder pain started within the last 7-8 months after radiation.  Does not currently have lymphedema.  R shoulder pain  located posterior shoulder, arm, lateral shoulder, medial shoulder blade.  Mastectomy incision located inferior to her chest to her R arm pit area per pt.  The chiropractic treatment helps with her neck pain but temporary.  Used to go to her chiropractor 3x/week. Currently goes there 1x/week now.  Pt was told not to use ice or heat due to her lymphedema.     Patient Stated Goals  To be able to turn her head better, fix her hair.     Currently in Pain?  Yes    Pain Score  0-No pain    Pain Location  Shoulder    Pain Orientation  Right    Pain Descriptors / Indicators  Burning;Stabbing feels like a nerve; bilateral lateral neck pain stabbing sensation when moving  head    Pain Type  Chronic pain    Pain Onset  More than a month ago    Pain Frequency  Constant    Aggravating Factors   turning her head to the R or L, shrugging her shoulder up, raising her R arm up (pulls on R lateral trunk), reaching behind her back    Pain Relieving Factors  rest.          [facility] PT Assessment - 03/23/18 1326      Assessment   Medical Diagnosis  R shoulder pain, S/P R mastectomy with radiation    Referring Provider  Lelon Huh, MD    Onset Date/Surgical Date  03/17/18 date PT referral signed, pain began 7-8 months ago    Hand Dominance  Right    Prior Therapy  No known PT for current condition      Precautions   Precaution Comments  lymphedema R UE, hx of cancer      Restrictions   Other Position/Activity Restrictions  ultrasound      Balance Screen   Has the patient fallen in the past 6 months  No    Has the patient had a decrease in activity level because of a fear of falling?   No    Is the patient reluctant to leave their home because of a fear of falling?   No      Prior Function   Leisure  Shop, make cards, work on puzzles, look at her phone      Observation/Other Assessments   Observations  R posterior arm pain with empty can test. Increased buning sensation R medial shoulder blade with UE movements. Michel Bickers test produced R anterior arm pain (along biceps and median nerve). (-) Yocum test. (+) Neer's impingement.     Focus on Therapeutic Outcomes (FOTO)   55    Quick DASH   54.5%      Posture/Postural Control   Posture Comments  forward neck, bilaterally protracted shoulders, slight R low back side bend      AROM   Right Shoulder Flexion  114 Degrees 140 degrees with surgical incision tightness    Right Shoulder ABduction  82 Degrees 106 degrees AAROM    Left Shoulder Flexion  118 Degrees    Left Shoulder ABduction  95 Degrees    Cervical Flexion  full with bilateral upper trap discomfort    Cervical Extension  limited with  bilateral uppert trap area (slightly higher) pain    Cervical - Right Side Bend  limited with L upper trap area pulling    Cervical - Left Side Bend  limited with R upper trap area pulling.     Cervical -  Right Rotation  limited with bilateral upper trap area pulling    Cervical - Left Rotation  limited with bilateral upper trap area pulling.       Strength   Right Shoulder Flexion  4/5    Right Shoulder ABduction  4/5    Right Shoulder Internal Rotation  4+/5    Right Shoulder External Rotation  4/5    Left Shoulder Flexion  4/5    Left Shoulder ABduction  4/5    Left Shoulder Internal Rotation  4+/5    Left Shoulder External Rotation  4/5      Palpation   Palpation comment  Tension bilateral scalene muscles R > L.  Tension bilateral upper trap muscles.                 Objective measurements completed on examination: See above findings.   1/6 FOTO  Therapeutic exercise  Gentle R scapular depression isometrics 10x3 with 5 second holds  120 degrees R shoulder flexion. Pt also states being better able to turn her head to the R and L afterwards   Seated bilateral scapular retraction 10x5 seconds for 2 sets   Decreased difficulty turning her head.   125 degrees R shoulder flexion   Reviewed and given as part of her HEP. Pt demonstrated and verbalized understanding.    Improved exercise technique, movement at target joints, use of target muscles after mod verbal, visual, tactile cues.   Pt states neck feels less tight afterwards.    Patient is a 67 year old female who came to physical therapy secondary to R shoulder pain with complain of neck pain as well. Neck pain being addressed by a chiropractor which helps alleviate her symptoms but temporarily per pt. She also presents with poor posture, bilateral upper trap and scalene muscle tension, special tests suggesting shoulder impingement involvement; shoulder weakness, and difficulty performing functional tasks such as  raising her arm, fixing her hair, and reaching behind her back. Patient will benefit from skilled physical therapy services to address the aforementioned deficits.            PT Education - 03/23/18 1709    Education provided  Yes    Education Details  ther-ex, HEP, plan of care    Person(s) Educated  Patient    Methods  Explanation;Demonstration;Tactile cues;Verbal cues;Handout    Comprehension  Returned demonstration;Verbalized understanding          PT Long Term Goals - 03/23/18 1737      PT LONG TERM GOAL #1   Title  Patient will have a decrease in R shoulder pain to 4/10 or less at worst to promote ability to fix her hair, raise her arm, and reach behind her back.     Baseline  8/10 at most for the past 2 months (03/23/2018)    Time  6    Period  Weeks    Status  New    Target Date  05/04/18      PT LONG TERM GOAL #2   Title  Patient will improve R shoulder ER strength by at least 1/2 MMT grade to promote ability to raise her arm up with less pain.     Time  6    Period  Weeks    Status  New    Target Date  05/04/18      PT LONG TERM GOAL #3   Title  Patient will improve R shoulder flexion AROM to at least 130 degrees flexion  to promote ability to raise her R arm and reach.     Baseline  114 degrees R shoulder flexion (03/23/2018)    Time  6    Period  Weeks    Status  New    Target Date  05/04/18      PT LONG TERM GOAL #4   Title  Patient will improve her shoulder FOTO score by at least 10 points as a demonstration of improved function.     Baseline  shoulder FOTO 55 (03/23/2018)    Time  6    Period  Weeks    Status  New    Target Date  05/04/18      PT LONG TERM GOAL #5   Title  Patient will improve her Quick Dash score by at least 10% as a demonstration of improved function.     Baseline  54.5% (03/23/2018)    Time  6    Period  Weeks    Status  New    Target Date  05/04/18             Plan - 03/23/18 1710    Clinical Impression Statement   Patient is a 67 year old female who came to physical therapy secondary to R shoulder pain with complain of neck pain as well. Neck pain being addressed by a chiropractor which helps alleviate her symptoms but temporarily per pt. She also presents with poor posture, bilateral upper trap and scalene muscle tension, special tests suggesting shoulder impingement involvement; shoulder weakness, and difficulty performing functional tasks such as raising her arm, fixing her hair, and reaching behind her back. Patient will benefit from skilled physical therapy services to address the aforementioned deficits.     History and Personal Factors relevant to plan of care:  chronicity of condition, hx of breast cancer, chemotherapy, radiation    Clinical Presentation  Stable    Clinical Presentation due to:  Slight improvement with chiropractic treatment but temporary    Clinical Decision Making  Low    Rehab Potential  Fair    Clinical Impairments Affecting Rehab Potential  chronicity of condition, pain, hx of breast CA and radiation and chemotherapy    PT Frequency  2x / week    PT Duration  6 weeks    PT Treatment/Interventions  Therapeutic activities;Therapeutic exercise;Aquatic Therapy;Electrical Stimulation;Iontophoresis 4mg /ml Dexamethasone;Neuromuscular re-education;Patient/family education;Manual techniques;Dry needling    PT Next Visit Plan  gentle scapular, and rotator cuff strengthening, manual techniques, ROM    Consulted and Agree with Plan of Care  Patient       Patient will benefit from skilled therapeutic intervention in order to improve the following deficits and impairments:  Pain, Postural dysfunction, Improper body mechanics, Decreased strength, Decreased range of motion  Visit Diagnosis: Chronic right shoulder pain - Plan: PT plan of care cert/re-cert  Muscle weakness (generalized) - Plan: PT plan of care cert/re-cert     Problem List Patient Active Problem List   Diagnosis Date  Noted  . Special screening for malignant neoplasms, colon   . Candida infection 05/18/2017  . Menopause 03/23/2017  . Rectocele 03/23/2017  . Cystocele, midline 03/23/2017  . Vaginal atrophy 03/23/2017  . Malignant neoplasm of right breast in female, estrogen receptor positive ([place])   . Left breast cancer with T3 tumor, >5 cm in greatest dimension ([place])   . S/P bilateral mastectomy 10/28/2015  . Left breast lump 10/15/2015  . Fatty infiltration of liver 10/01/2015  . Gonalgia 10/01/2015  .  Body tinea 10/01/2015  . Invasive lobular carcinoma of right breast, stage 2 ([place]) 10/01/2015  . Hypothyroidism 09/25/2015  . Morbid obesity ([place]) 09/25/2015  . Hypercholesterolemia 09/25/2015  . Hypertension 09/25/2015  . Allergic rhinitis 09/25/2015  . GERD (gastroesophageal reflux disease) 09/25/2015  . Alopecia 09/25/2015  . Lump or mass in breast 09/25/2015  . History of peptic ulcer disease 09/25/2015  . Eczema 09/25/2015    Joneen Boers PT, DPT   03/23/2018, 5:56 PM  [facility] [address], [place], [address] Phone: [phone]   Fax:  [phone]  Name: Kaylee Reyes MRN: [id number] Date of Birth: 15-Jan-1951

## 2018-03-23 NOTE — Telephone Encounter (Signed)
Called pt back to see if she needed [place] to do anything else for her concerning ordering sleeve. Patient stated the [facility] was able to order sleeve for her for free. Patient states she not be needing anything else.

## 2018-03-27 ENCOUNTER — Ambulatory Visit: Payer: Medicare Other

## 2018-03-27 DIAGNOSIS — G8929 Other chronic pain: Secondary | ICD-10-CM

## 2018-03-27 DIAGNOSIS — M6281 Muscle weakness (generalized): Secondary | ICD-10-CM

## 2018-03-27 DIAGNOSIS — M25511 Pain in right shoulder: Principal | ICD-10-CM

## 2018-03-27 NOTE — Therapy (Signed)
[facility] [address] [place], [address] Phone: [phone]   Fax:  [phone]  Physical Therapy Treatment  Patient Details  Name: Kaylee Reyes MRN: [id number] Date of Birth: 1951-11-02 Referring Provider: Lelon Huh, MD   Encounter Date: 03/27/2018  PT End of Session - 03/27/18 1027    Visit Number  2    Number of Visits  13    Date for PT Re-Evaluation  05/04/18    PT Start Time  1027    PT Stop Time  1114    PT Time Calculation (min)  47 min    Activity Tolerance  Patient tolerated treatment well    Behavior During Therapy  [facility] for tasks assessed/performed       Past Medical History:  Diagnosis Date  . Anemia   . Cancer ([place]) 09/2015   breast cancer  . GERD (gastroesophageal reflux disease)   . History of chicken pox   . Hyperlipidemia   . Hypertension   . Hypothyroidism   . Neuropathy of foot    bilateral  . PONV (postoperative nausea and vomiting)    exploratory surgery at [facility]  . Thyroid disease   . Tuberculosis exposure    Mother had TB.  Pt shows (+) on tests, but has never had.  . Vertigo    last episode over 1 yr ago  . Wears dentures    partial upper and lower    Past Surgical History:  Procedure Laterality Date  . ABDOMINAL HYSTERECTOMY  07/13/2013   Total. with BSO for postmenopausal bleeding and fibroids with large cervical polyp. Dr. Levy Sjogren and Dr. Glennon Mac at [facility]  . ABDOMINAL ULTRASOUND  04/17/2011   Hepatomegaly with borderline slenomegaly. Suggestive of fatty liver. s/p cholecystectomy. Portions of aorta obscured  . BREAST SURGERY Right 2011   BREAST BIOPSY  . CESAREAN SECTION  1988  . CHOLECYSTECTOMY  1990  . COLONOSCOPY WITH PROPOFOL N/A 09/01/2017   Procedure: COLONOSCOPY WITH PROPOFOL;  Surgeon: [name], MD;  Location: [place];  Service: Gastroenterology;  Laterality: N/A;  . EXPLORATORY LAPAROTOMY     Left breast  . MASTECTOMY W/  SENTINEL NODE BIOPSY Bilateral 10/28/2015   Procedure: MASTECTOMY WITH SENTINEL LYMPH NODE BIOPSY;  Surgeon: Hubbard Robinson, MD;  Location: [facility];  Service: General;  Laterality: Bilateral;  . PORT-A-CATH REMOVAL Left 10/11/2016   Procedure: REMOVAL PORT-A-CATH;  Surgeon: Hubbard Robinson, MD;  Location: [facility];  Service: General;  Laterality: Left;  . PORTACATH PLACEMENT Left 12/23/2015   Procedure: INSERTION PORT-A-CATH;  Surgeon: Hubbard Robinson, MD;  Location: [facility];  Service: General;  Laterality: Left;  . WRIST SURGERY      There were no vitals filed for this visit.  Subjective Assessment - 03/27/18 1028    Subjective  R shoulder is the same. It's always there. Was sore after performing her HEP initially. Better with HEP this morning. 0/10 R shoulder pain currently at rest, 5/10 R shoulder pain when raising her R arm up.     Pertinent History  R shoulder pain. Pt states she does not want to do anything to her neck because her chiropractor is currently working on it. Pt had bilatral mastectomy in 2016 and went through chemo and radiation. Feels like her R shoulder pain came from the radiation. Pt states having a tendency to have rounded shoulders which could affect her shoulder.  R shoulder pain started within the last  7-8 months after radiation.  Does not currently have lymphedema.  R shoulder pain located posterior shoulder, arm, lateral shoulder, medial shoulder blade.  Mastectomy incision located inferior to her chest to her R arm pit area per pt.  The chiropractic treatment helps with her neck pain but temporary.  Used to go to her chiropractor 3x/week. Currently goes there 1x/week now.  Pt was told not to use ice or heat due to her lymphedema.     Patient Stated Goals  To be able to turn her head better, fix her hair.     Currently in Pain?  Yes    Pain Score  5  when raising her R arm up.     Pain Onset  More than a month ago                                PT Education - 03/27/18 1039    Education provided  Yes    Education Details  ther-ex    [employer]) Educated  Patient    Methods  Explanation;Demonstration;Tactile cues;Verbal cues    Comprehension  Returned demonstration;Verbalized understanding         Objectives  117 R shoudler flexion with anterior lateral arm pain  28 degrees R and L cervical rotation.   Minimal resistance with exercises  Check arm for lymphedema secondary to hx   2/6 FOTO  Therapeutic exercise  R shoulder ER isometrics at wall, elbow bent at 90 degrees flexion 10x5 seconds, then 5x5 seconds. Increased pain. Pt observed to perform abduction instead of ER motion  Standing R shoulder ER isometrics with manual resistance from PT 10x5 seconds for 3 sets No pain with exercise  121 degrees L shoulder flexion without R shoulder pain afterwards   Gentle R scapular depression isometrics with R forearm on arm rest 10x2 with 5 second holds  Gentle R scapular depression isometrics with manual resistance from PT 10x3 with 5 second holds   Seated R scapular retraction with manual resistance targeting the lower trap muscles  10x5 seconds for 3 sets   40 degrees R cervical rotatoin, 35 degrees L cervical rotation, 120 degrees R shoulder flexion   Seated L scapular retraction with manual resistance targeting the lower trap muscles 10x5 seconds for 3 sets  40 degrees R and L cervical rotation   126 degrees R shoulder flexion without pain afterwards.    Improved exercise technique, movement at target joints, use of target muscles after mod verbal, visual, tactile cues.   Improved ability to raise R arm into flexion without pain after exercises to promote ER and lower trap muscle use. Decreased neck stiffness as well with activation of lower trap muscles. Pt tolerated session well without aggravation of symptoms.       PT Long Term Goals - 03/23/18 1737       PT LONG TERM GOAL #1   Title  Patient will have a decrease in R shoulder pain to 4/10 or less at worst to promote ability to fix her hair, raise her arm, and reach behind her back.     Baseline  8/10 at most for the past 2 months (03/23/2018)    Time  6    Period  Weeks    Status  New    Target Date  05/04/18      PT LONG TERM GOAL #2   Title  Patient will improve R shoulder ER strength  by at least 1/2 MMT grade to promote ability to raise her arm up with less pain.     Time  6    Period  Weeks    Status  New    Target Date  05/04/18      PT LONG TERM GOAL #3   Title  Patient will improve R shoulder flexion AROM to at least 130 degrees flexion to promote ability to raise her R arm and reach.     Baseline  114 degrees R shoulder flexion (03/23/2018)    Time  6    Period  Weeks    Status  New    Target Date  05/04/18      PT LONG TERM GOAL #4   Title  Patient will improve her shoulder FOTO score by at least 10 points as a demonstration of improved function.     Baseline  shoulder FOTO 55 (03/23/2018)    Time  6    Period  Weeks    Status  New    Target Date  05/04/18      PT LONG TERM GOAL #5   Title  Patient will improve her Quick Dash score by at least 10% as a demonstration of improved function.     Baseline  54.5% (03/23/2018)    Time  6    Period  Weeks    Status  New    Target Date  05/04/18            Plan - 03/27/18 1025    Clinical Impression Statement  Improved ability to raise R arm into flexion without pain after exercises to promote ER and lower trap muscle use. Decreased neck stiffness as well with activation of lower trap muscles. Pt tolerated session well without aggravation of symptoms.     Rehab Potential  Fair    Clinical Impairments Affecting Rehab Potential  chronicity of condition, pain, hx of breast CA and radiation and chemotherapy    PT Frequency  2x / week    PT Duration  6 weeks    PT Treatment/Interventions  Therapeutic  activities;Therapeutic exercise;Aquatic Therapy;Electrical Stimulation;Iontophoresis 4mg /ml Dexamethasone;Neuromuscular re-education;Patient/family education;Manual techniques;Dry needling    PT Next Visit Plan  gentle scapular, and rotator cuff strengthening, manual techniques, ROM    Consulted and Agree with Plan of Care  Patient       Patient will benefit from skilled therapeutic intervention in order to improve the following deficits and impairments:  Pain, Postural dysfunction, Improper body mechanics, Decreased strength, Decreased range of motion  Visit Diagnosis: Chronic right shoulder pain  Muscle weakness (generalized)     Problem List Patient Active Problem List   Diagnosis Date Noted  . Special screening for malignant neoplasms, colon   . Candida infection 05/18/2017  . Menopause 03/23/2017  . Rectocele 03/23/2017  . Cystocele, midline 03/23/2017  . Vaginal atrophy 03/23/2017  . Malignant neoplasm of right breast in female, estrogen receptor positive ([place])   . Left breast cancer with T3 tumor, >5 cm in greatest dimension ([place])   . S/P bilateral mastectomy 10/28/2015  . Left breast lump 10/15/2015  . Fatty infiltration of liver 10/01/2015  . Gonalgia 10/01/2015  . Body tinea 10/01/2015  . Invasive lobular carcinoma of right breast, stage 2 ([place]) 10/01/2015  . Hypothyroidism 09/25/2015  . Morbid obesity ([place]) 09/25/2015  . Hypercholesterolemia 09/25/2015  . Hypertension 09/25/2015  . Allergic rhinitis 09/25/2015  . GERD (gastroesophageal reflux disease) 09/25/2015  . Alopecia 09/25/2015  .  Lump or mass in breast 09/25/2015  . History of peptic ulcer disease 09/25/2015  . Eczema 09/25/2015    Joneen Boers PT, DPT   03/27/2018, 6:30 PM  [facility] [address], [place], [address] Phone: [phone]   Fax:  [phone]  Name: Kaylee Reyes MRN: [id number] Date of Birth:  01/09/1951

## 2018-03-30 ENCOUNTER — Ambulatory Visit: Payer: Medicare Other

## 2018-03-30 DIAGNOSIS — G8929 Other chronic pain: Secondary | ICD-10-CM | POA: Diagnosis not present

## 2018-03-30 DIAGNOSIS — M25511 Pain in right shoulder: Principal | ICD-10-CM

## 2018-03-30 DIAGNOSIS — M6281 Muscle weakness (generalized): Secondary | ICD-10-CM | POA: Diagnosis not present

## 2018-03-30 NOTE — Therapy (Signed)
[facility] [address], [place], [address] Phone: [phone]   Fax:  [phone]  Physical Therapy Treatment  Patient Details  Name: Kaylee Reyes MRN: [id number] Date of Birth: 07-31-1951 Referring Provider: Lelon Huh, MD   Encounter Date: 03/30/2018  PT End of Session - 03/30/18 1435    Visit Number  3    Number of Visits  13    Date for PT Re-Evaluation  05/04/18    PT Start Time  1435    PT Stop Time  1518    PT Time Calculation (min)  43 min    Activity Tolerance  Patient tolerated treatment well    Behavior During Therapy  [facility] for tasks assessed/performed       Past Medical History:  Diagnosis Date  . Anemia   . Cancer ([place]) 09/2015   breast cancer  . GERD (gastroesophageal reflux disease)   . History of chicken pox   . Hyperlipidemia   . Hypertension   . Hypothyroidism   . Neuropathy of foot    bilateral  . PONV (postoperative nausea and vomiting)    exploratory surgery at [facility]  . Thyroid disease   . Tuberculosis exposure    Mother had TB.  Pt shows (+) on tests, but has never had.  . Vertigo    last episode over 1 yr ago  . Wears dentures    partial upper and lower    Past Surgical History:  Procedure Laterality Date  . ABDOMINAL HYSTERECTOMY  07/13/2013   Total. with BSO for postmenopausal bleeding and fibroids with large cervical polyp. Dr. Levy Sjogren and Dr. Glennon Mac at [facility]  . ABDOMINAL ULTRASOUND  04/17/2011   Hepatomegaly with borderline slenomegaly. Suggestive of fatty liver. s/p cholecystectomy. Portions of aorta obscured  . BREAST SURGERY Right 2011   BREAST BIOPSY  . CESAREAN SECTION  1988  . CHOLECYSTECTOMY  1990  . COLONOSCOPY WITH PROPOFOL N/A 09/01/2017   Procedure: COLONOSCOPY WITH PROPOFOL;  Surgeon: [name], MD;  Location: [facility];  Service: Gastroenterology;  Laterality: N/A;  . EXPLORATORY LAPAROTOMY     Left breast  . MASTECTOMY W/  SENTINEL NODE BIOPSY Bilateral 10/28/2015   Procedure: MASTECTOMY WITH SENTINEL LYMPH NODE BIOPSY;  Surgeon: Hubbard Robinson, MD;  Location: [facility];  Service: General;  Laterality: Bilateral;  . PORT-A-CATH REMOVAL Left 10/11/2016   Procedure: REMOVAL PORT-A-CATH;  Surgeon: Hubbard Robinson, MD;  Location: [facility];  Service: General;  Laterality: Left;  . PORTACATH PLACEMENT Left 12/23/2015   Procedure: INSERTION PORT-A-CATH;  Surgeon: Hubbard Robinson, MD;  Location: [facility];  Service: General;  Laterality: Left;  . WRIST SURGERY      There were no vitals filed for this visit.  Subjective Assessment - 03/30/18 1437    Subjective  R shoulder is not hurting. Turning her head is not as bad. Trying to do her HEP.     Pertinent History  R shoulder pain. Pt states she does not want to do anything to her neck because her chiropractor is currently working on it. Pt had bilatral mastectomy in 2016 and went through chemo and radiation. Feels like her R shoulder pain came from the radiation. Pt states having a tendency to have rounded shoulders which could affect her shoulder.  R shoulder pain started within the last 7-8 months after radiation.  Does not currently have lymphedema.  R shoulder pain located posterior shoulder, arm, lateral shoulder,  medial shoulder blade.  Mastectomy incision located inferior to her chest to her R arm pit area per pt.  The chiropractic treatment helps with her neck pain but temporary.  Used to go to her chiropractor 3x/week. Currently goes there 1x/week now.  Pt was told not to use ice or heat due to her lymphedema.     Patient Stated Goals  To be able to turn her head better, fix her hair.     Currently in Pain?  No/denies    Pain Onset  More than a month ago                               PT Education - 03/30/18 1437    Education provided  Yes    Education Details  ther-ex, HEP    Person(s) Educated  Patient    Methods   Explanation;Demonstration;Tactile cues;Verbal cues;Handout    Comprehension  Returned demonstration;Verbalized understanding         Objectives  120 R shoudler flexion with anterior lateral arm pain  35 degrees R, 30 degrees L cervical rotation   Minimal resistance with exercises  Check arm for lymphedema secondary to hx  Medbridge Access Code: [email or web address]  3/6 FOTO  Therapeutic exercise  Seated L scapular retraction with manual resistance targeting the lower trap muscles 10x5 seconds for 3 sets  Seated R scapular retraction with manual resistance targeting the lower trap muscles  10x5 seconds for 3 sets   40 degrees R cervical rotation, 43 degrees L cervical rotation and 132 degrees R shoulder flexion after lower trap strengthening. Pt states able to turn head more comfortably.   R shoulder ER isometrics, self manual resistance 10x3 with 5 second holds  Reviewed HEP. Pt demonstrated and verbalized understanding.   Gentle R scapular depression isometrics with manual resistance from PT 10x3 with 5 second holds    Improved exercise technique, movement at target joints, use of target muscles after min to mod verbal, visual, tactile cues.     Manual therapy  Seated gentle STM to upper trap muscles to decrease muscle knot R and L upper trap.    Improved R shoulder flexion AROM and cervical AROM with lower trap muscle strengthening. Continued with ER muscle strengthening to promote ability to raise R arm up more comfortably. Pt tolerated session well without aggravation of symptoms.          PT Long Term Goals - 03/23/18 1737      PT LONG TERM GOAL #1   Title  Patient will have a decrease in R shoulder pain to 4/10 or less at worst to promote ability to fix her hair, raise her arm, and reach behind her back.     Baseline  8/10 at most for the past 2 months (03/23/2018)    Time  6    Period  Weeks    Status  New    Target Date  05/04/18      PT LONG TERM  GOAL #2   Title  Patient will improve R shoulder ER strength by at least 1/2 MMT grade to promote ability to raise her arm up with less pain.     Time  6    Period  Weeks    Status  New    Target Date  05/04/18      PT LONG TERM GOAL #3   Title  Patient will improve R shoulder flexion AROM to  at least 130 degrees flexion to promote ability to raise her R arm and reach.     Baseline  114 degrees R shoulder flexion (03/23/2018)    Time  6    Period  Weeks    Status  New    Target Date  05/04/18      PT LONG TERM GOAL #4   Title  Patient will improve her shoulder FOTO score by at least 10 points as a demonstration of improved function.     Baseline  shoulder FOTO 55 (03/23/2018)    Time  6    Period  Weeks    Status  New    Target Date  05/04/18      PT LONG TERM GOAL #5   Title  Patient will improve her Quick Dash score by at least 10% as a demonstration of improved function.     Baseline  54.5% (03/23/2018)    Time  6    Period  Weeks    Status  New    Target Date  05/04/18            Plan - 03/30/18 1438    Clinical Impression Statement  Improved R shoulder flexion AROM and cervical AROM with lower trap muscle strengthening. Continued with ER muscle strengthening to promote ability to raise R arm up more comfortably. Pt tolerated session well without aggravation of symptoms.     Rehab Potential  Fair    Clinical Impairments Affecting Rehab Potential  chronicity of condition, pain, hx of breast CA and radiation and chemotherapy    PT Frequency  2x / week    PT Duration  6 weeks    PT Treatment/Interventions  Therapeutic activities;Therapeutic exercise;Aquatic Therapy;Electrical Stimulation;Iontophoresis 4mg /ml Dexamethasone;Neuromuscular re-education;Patient/family education;Manual techniques;Dry needling    PT Next Visit Plan  gentle scapular, and rotator cuff strengthening, manual techniques, ROM    Consulted and Agree with Plan of Care  Patient       Patient will  benefit from skilled therapeutic intervention in order to improve the following deficits and impairments:  Pain, Postural dysfunction, Improper body mechanics, Decreased strength, Decreased range of motion  Visit Diagnosis: Chronic right shoulder pain  Muscle weakness (generalized)     Problem List Patient Active Problem List   Diagnosis Date Noted  . Special screening for malignant neoplasms, colon   . Candida infection 05/18/2017  . Menopause 03/23/2017  . Rectocele 03/23/2017  . Cystocele, midline 03/23/2017  . Vaginal atrophy 03/23/2017  . Malignant neoplasm of right breast in female, estrogen receptor positive ([place])   . Left breast cancer with T3 tumor, >5 cm in greatest dimension ([place])   . S/P bilateral mastectomy 10/28/2015  . Left breast lump 10/15/2015  . Fatty infiltration of liver 10/01/2015  . Gonalgia 10/01/2015  . Body tinea 10/01/2015  . Invasive lobular carcinoma of right breast, stage 2 ([place]) 10/01/2015  . Hypothyroidism 09/25/2015  . Morbid obesity ([place]) 09/25/2015  . Hypercholesterolemia 09/25/2015  . Hypertension 09/25/2015  . Allergic rhinitis 09/25/2015  . GERD (gastroesophageal reflux disease) 09/25/2015  . Alopecia 09/25/2015  . Lump or mass in breast 09/25/2015  . History of peptic ulcer disease 09/25/2015  . Eczema 09/25/2015    Joneen Boers PT, DPT   03/30/2018, 8:29 PM  [facility] [address] [place], [address] Phone: [phone]   Fax:  [phone]  Name: Kaylee Reyes MRN: [id number] Date of Birth: 11-24-1951

## 2018-03-30 NOTE — Patient Instructions (Signed)
Medbridge Access Code: [email or web address]    Seated Isometric Shoulder External Rotation with Towel 10x3

## 2018-03-31 DIAGNOSIS — R200 Anesthesia of skin: Secondary | ICD-10-CM | POA: Diagnosis not present

## 2018-03-31 DIAGNOSIS — M9901 Segmental and somatic dysfunction of cervical region: Secondary | ICD-10-CM | POA: Diagnosis not present

## 2018-03-31 DIAGNOSIS — M542 Cervicalgia: Secondary | ICD-10-CM | POA: Diagnosis not present

## 2018-04-03 ENCOUNTER — Other Ambulatory Visit: Payer: Self-pay | Admitting: Family Medicine

## 2018-04-03 ENCOUNTER — Ambulatory Visit: Payer: Medicare Other

## 2018-04-03 DIAGNOSIS — M25511 Pain in right shoulder: Principal | ICD-10-CM

## 2018-04-03 DIAGNOSIS — G8929 Other chronic pain: Secondary | ICD-10-CM

## 2018-04-03 DIAGNOSIS — M6281 Muscle weakness (generalized): Secondary | ICD-10-CM

## 2018-04-03 NOTE — Telephone Encounter (Signed)
Please call in Magic mouthwash,  1 Part viscous lidocaine 2% 1 Part Maalox (do not substitute Kaopectate) 1 Part diphenhydramine 12.5 mg per 5 ml elixir. Rinse and spit 5 ml every 3-4 hour as needed.  114ml. Refill x 5.

## 2018-04-03 NOTE — Therapy (Signed)
[facility] [address], [place], [address] Phone: [phone]   Fax:  [phone]  Physical Therapy Treatment  Patient Details  Name: Kaylee Reyes MRN: [id number] Date of Birth: 1951/04/05 Referring Provider: Lelon Huh, MD   Encounter Date: 04/03/2018  PT End of Session - 04/03/18 0905    Visit Number  4    Number of Visits  13    Date for PT Re-Evaluation  05/04/18    PT Start Time  0905    PT Stop Time  0946    PT Time Calculation (min)  41 min    Activity Tolerance  Patient tolerated treatment well    Behavior During Therapy  [facility] for tasks assessed/performed       Past Medical History:  Diagnosis Date  . Anemia   . Cancer ([place]) 09/2015   breast cancer  . GERD (gastroesophageal reflux disease)   . History of chicken pox   . Hyperlipidemia   . Hypertension   . Hypothyroidism   . Neuropathy of foot    bilateral  . PONV (postoperative nausea and vomiting)    exploratory surgery at [facility]  . Thyroid disease   . Tuberculosis exposure    Mother had TB.  Pt shows (+) on tests, but has never had.  . Vertigo    last episode over 1 yr ago  . Wears dentures    partial upper and lower    Past Surgical History:  Procedure Laterality Date  . ABDOMINAL HYSTERECTOMY  07/13/2013   Total. with BSO for postmenopausal bleeding and fibroids with large cervical polyp. Dr. Levy Sjogren and Dr. Glennon Mac at [facility]  . ABDOMINAL ULTRASOUND  04/17/2011   Hepatomegaly with borderline slenomegaly. Suggestive of fatty liver. s/p cholecystectomy. Portions of aorta obscured  . BREAST SURGERY Right 2011   BREAST BIOPSY  . CESAREAN SECTION  1988  . CHOLECYSTECTOMY  1990  . COLONOSCOPY WITH PROPOFOL N/A 09/01/2017   Procedure: COLONOSCOPY WITH PROPOFOL;  Surgeon: [name], MD;  Location: [facility];  Service: Gastroenterology;  Laterality: N/A;  . EXPLORATORY LAPAROTOMY     Left breast  . MASTECTOMY W/  SENTINEL NODE BIOPSY Bilateral 10/28/2015   Procedure: MASTECTOMY WITH SENTINEL LYMPH NODE BIOPSY;  Surgeon: Hubbard Robinson, MD;  Location: [facility];  Service: General;  Laterality: Bilateral;  . PORT-A-CATH REMOVAL Left 10/11/2016   Procedure: REMOVAL PORT-A-CATH;  Surgeon: Hubbard Robinson, MD;  Location: [facility];  Service: General;  Laterality: Left;  . PORTACATH PLACEMENT Left 12/23/2015   Procedure: INSERTION PORT-A-CATH;  Surgeon: Hubbard Robinson, MD;  Location: [facility];  Service: General;  Laterality: Left;  . WRIST SURGERY      There were no vitals filed for this visit.  Subjective Assessment - 04/03/18 0906    Subjective  R shoulder feels better. Went the chiropractor who said that R UT area has loosened up since she last saw her. Pt states that the chiropractic treatment was was not really working.  Neck is not hurting when she turns her head today. Feels like a stiffness.  Has not done the ER isometrics home exercise since last session.     Pertinent History  R shoulder pain. Pt states she does not want to do anything to her neck because her chiropractor is currently working on it. Pt had bilatral mastectomy in 2016 and went through chemo and radiation. Feels like her R shoulder pain came from the radiation.  Pt states having a tendency to have rounded shoulders which could affect her shoulder.  R shoulder pain started within the last 7-8 months after radiation.  Does not currently have lymphedema.  R shoulder pain located posterior shoulder, arm, lateral shoulder, medial shoulder blade.  Mastectomy incision located inferior to her chest to her R arm pit area per pt.  The chiropractic treatment helps with her neck pain but temporary.  Used to go to her chiropractor 3x/week. Currently goes there 1x/week now.  Pt was told not to use ice or heat due to her lymphedema.     Patient Stated Goals  To be able to turn her head better, fix her hair.     Currently in Pain?  No/denies    Pain  Score  0-No pain no pain , just stiffness/pulling sensation.     Pain Onset  More than a month ago                               PT Education - 04/03/18 0924    Education provided  Yes    Education Details  ther-ex    [employer]) Educated  Patient    Methods  Explanation;Demonstration;Tactile cues;Verbal cues    Comprehension  Returned demonstration;Verbalized understanding          Objectives  132 R shoudler flexion with anterior lateral arm pain at start of session.   40 degrees R, 40 degrees L cervical rotation  Minimal resistance with exercises  Check arm for lymphedema secondary to hx  Medbridge Access Code: [email or web address]  4/6 FOTO  Therapeutic exercise  Seated L scapular retraction with manual resistance targeting the lower trap muscles 10x5 seconds for 3 sets  SeatedRscapular retractionwith manual resistance targeting the lower trap muscles10x5 seconds for 3sets   Seated gentle thoracic extension over chair 10x5 seconds for 3 sets to promote scapular retraction  R shoulder ER isometrics, self manual resistance 10x with 5 second holds   Then with PT manual resistance 10x2 with 5 second holds  Gentle R scapular depression isometricswith manual resistance from PT10x3 with 5 second holds  Gentle R shoulder scaption isometrics at neutral, elbow bent 10x5 seconds for 3 sets  R scalene muscle activation observed.  Improved exercise technique, movement at target joints, use of target muscles after min to mod verbal, visual, tactile cues.    Pt demonstrates good carryover of improved R shoulder flexion and cervical AROM from last session. Continued working on lower trap and ER muscle strengthening, thoracic extension, scapular retraction. Pt tolerated session well without aggravation of symptoms.          PT Long Term Goals - 03/23/18 1737      PT LONG TERM GOAL #1   Title  Patient will have a decrease in R shoulder pain  to 4/10 or less at worst to promote ability to fix her hair, raise her arm, and reach behind her back.     Baseline  8/10 at most for the past 2 months (03/23/2018)    Time  6    Period  Weeks    Status  New    Target Date  05/04/18      PT LONG TERM GOAL #2   Title  Patient will improve R shoulder ER strength by at least 1/2 MMT grade to promote ability to raise her arm up with less pain.     Time  6  Period  Weeks    Status  New    Target Date  05/04/18      PT LONG TERM GOAL #3   Title  Patient will improve R shoulder flexion AROM to at least 130 degrees flexion to promote ability to raise her R arm and reach.     Baseline  114 degrees R shoulder flexion (03/23/2018)    Time  6    Period  Weeks    Status  New    Target Date  05/04/18      PT LONG TERM GOAL #4   Title  Patient will improve her shoulder FOTO score by at least 10 points as a demonstration of improved function.     Baseline  shoulder FOTO 55 (03/23/2018)    Time  6    Period  Weeks    Status  New    Target Date  05/04/18      PT LONG TERM GOAL #5   Title  Patient will improve her Quick Dash score by at least 10% as a demonstration of improved function.     Baseline  54.5% (03/23/2018)    Time  6    Period  Weeks    Status  New    Target Date  05/04/18            Plan - 04/03/18 0924    Clinical Impression Statement  Pt demonstrates good carryover of improved R shoulder flexion and cervical AROM from last session. Continued working on lower trap and ER muscle strengthening, thoracic extension, scapular retraction. Pt tolerated session well without aggravation of symptoms.     Rehab Potential  Fair    Clinical Impairments Affecting Rehab Potential  chronicity of condition, pain, hx of breast CA and radiation and chemotherapy    PT Frequency  2x / week    PT Duration  6 weeks    PT Treatment/Interventions  Therapeutic activities;Therapeutic exercise;Aquatic Therapy;Electrical Stimulation;Iontophoresis  4mg /ml Dexamethasone;Neuromuscular re-education;Patient/family education;Manual techniques;Dry needling    PT Next Visit Plan  gentle scapular, and rotator cuff strengthening, manual techniques, ROM    Consulted and Agree with Plan of Care  Patient       Patient will benefit from skilled therapeutic intervention in order to improve the following deficits and impairments:  Pain, Postural dysfunction, Improper body mechanics, Decreased strength, Decreased range of motion  Visit Diagnosis: Chronic right shoulder pain  Muscle weakness (generalized)     Problem List Patient Active Problem List   Diagnosis Date Noted  . Special screening for malignant neoplasms, colon   . Candida infection 05/18/2017  . Menopause 03/23/2017  . Rectocele 03/23/2017  . Cystocele, midline 03/23/2017  . Vaginal atrophy 03/23/2017  . Malignant neoplasm of right breast in female, estrogen receptor positive ([place])   . Left breast cancer with T3 tumor, >5 cm in greatest dimension ([place])   . S/P bilateral mastectomy 10/28/2015  . Left breast lump 10/15/2015  . Fatty infiltration of liver 10/01/2015  . Gonalgia 10/01/2015  . Body tinea 10/01/2015  . Invasive lobular carcinoma of right breast, stage 2 ([place]) 10/01/2015  . Hypothyroidism 09/25/2015  . Morbid obesity ([place]) 09/25/2015  . Hypercholesterolemia 09/25/2015  . Hypertension 09/25/2015  . Allergic rhinitis 09/25/2015  . GERD (gastroesophageal reflux disease) 09/25/2015  . Alopecia 09/25/2015  . Lump or mass in breast 09/25/2015  . History of peptic ulcer disease 09/25/2015  . Eczema 09/25/2015    Joneen Boers PT, DPT  04/03/2018, 8:05 PM  [facility] [address] [place], [address] Phone: [phone]   Fax:  [phone]  Name: BRIDGETT HATTABAUGH MRN: [id number] Date of Birth: 1951/06/14

## 2018-04-03 NOTE — Telephone Encounter (Signed)
RX called into pharmacy

## 2018-04-03 NOTE — Telephone Encounter (Signed)
Pt contacted office for refill request on the following medications:  magic mouthwash SOLN   [facility] [address]  Last Rx: 01/15/17 Pt stated that Dr. Caryn Section gives her this Rx as needed when her dental particles causes sores inside of her mouth. Please advise. Thanks TNP

## 2018-04-06 ENCOUNTER — Telehealth: Payer: Self-pay | Admitting: Gastroenterology

## 2018-04-06 NOTE — Telephone Encounter (Signed)
Pt would like some advise  From Nurse she states she had colonoscopy 9/18 she has been having diverticulities symptoms she states she is bloated and is having sensativity on left side  She is also taking rx for Cancer which is causing her constipation she is taking rx benefiber  And 2 stool softners she would like to know if she could get rx antibiotic  Please call pt

## 2018-04-06 NOTE — Telephone Encounter (Signed)
Please schedule pt a follow up appt to discuss with Dr. Marius Ditch. If she is constipated due to her cancer treatments, she will need to discuss medication options with her. She is a pt of Dr. Marius Ditch. Thank you!

## 2018-04-06 NOTE — Telephone Encounter (Signed)
Called pt to set up apt with Dr. Marius Ditch she sttes she made an apt with  Her PCP

## 2018-04-07 ENCOUNTER — Encounter: Payer: Self-pay | Admitting: Family Medicine

## 2018-04-07 ENCOUNTER — Ambulatory Visit (INDEPENDENT_AMBULATORY_CARE_PROVIDER_SITE_OTHER): Payer: Medicare Other | Admitting: Family Medicine

## 2018-04-07 ENCOUNTER — Ambulatory Visit: Payer: PRIVATE HEALTH INSURANCE | Admitting: Gastroenterology

## 2018-04-07 VITALS — BP 118/78 | HR 63 | Temp 98.4°F | Resp 18 | Wt 234.0 lb

## 2018-04-07 DIAGNOSIS — R1032 Left lower quadrant pain: Secondary | ICD-10-CM

## 2018-04-07 DIAGNOSIS — K5900 Constipation, unspecified: Secondary | ICD-10-CM | POA: Insufficient documentation

## 2018-04-07 DIAGNOSIS — M9901 Segmental and somatic dysfunction of cervical region: Secondary | ICD-10-CM | POA: Diagnosis not present

## 2018-04-07 DIAGNOSIS — M542 Cervicalgia: Secondary | ICD-10-CM | POA: Diagnosis not present

## 2018-04-07 DIAGNOSIS — R200 Anesthesia of skin: Secondary | ICD-10-CM | POA: Diagnosis not present

## 2018-04-07 MED ORDER — CIPROFLOXACIN HCL 500 MG PO TABS: 500.0000 mg | ORAL_TABLET | Freq: Two times a day (BID) | ORAL | 0 refills | Status: DC

## 2018-04-07 MED ORDER — METRONIDAZOLE 500 MG PO TABS: 500.0000 mg | ORAL_TABLET | Freq: Two times a day (BID) | ORAL | 0 refills | Status: AC

## 2018-04-07 NOTE — Progress Notes (Signed)
Patient: Kaylee Reyes Female    DOB: 1951/04/06   67 y.o.   MRN: [id number] Visit Date: 04/07/2018  Today's Provider: Lelon Huh, MD   Chief Complaint  Patient presents with  . Abdominal Pain    x 4 days   Subjective:    Abdominal Pain  This is a new problem. Episode onset: 4 days ago. The problem has been rapidly improving. Pain location: lower left side that radiates to her front. The quality of the pain is aching. Associated symptoms include constipation, diarrhea, flatus and nausea. Pertinent negatives include no dysuria, fever, frequency or vomiting. Treatments tried: Ibuprofen. The treatment provided no relief.  Patient believes she may have Diverticulitis. She doesn't think it is due to constipations because she had episode of diarrhea when symptoms started. She states her last colonoscopy revealed that she had Diverticulosis. She reports recently eating some nuts and thinks this may have triggered diverticulitis, although she had never been diagnosed with diverticulitis in the past.   She also complains of chronic constipation since being on letrozole. She was given some samples of Amitiza which she states were effective, but she could not afford to fill prescription. She is taking 4 docusates and Benefiber every day. She states she tried Metamucil but didn't tolerate it.     Allergies  Allergen Reactions  . Penicillins Shortness Of Breath    Has patient had a PCN reaction causing immediate rash, facial/tongue/throat swelling, SOB or lightheadedness with hypotension: Yes Has patient had a PCN reaction causing severe rash involving mucus membranes or skin necrosis: No Has patient had a PCN reaction that required hospitalization No Has patient had a PCN reaction occurring within the last 10 years: No If all of the above answers are "NO", then may proceed with Cephalosporin use.   . Nickel Hives  . Pravastatin Sodium Other (See Comments)    Myalgia      Current  Outpatient Medications:  .  atenolol (TENORMIN) 50 MG tablet, Take 1 tablet (50 mg total) by mouth daily., Disp: 90 tablet, Rfl: 4 .  B Complex-C (SUPER B COMPLEX PO), Take 1 tablet by mouth daily., Disp: , Rfl:  .  calcium carbonate (OS-CAL) 600 MG TABS tablet, Take 1 tablet (600 mg total) by mouth 2 (two) times daily with a meal., Disp: 60 tablet, Rfl: 5 .  Cholecalciferol (VITAMIN D3) 2000 units TABS, Take 2,000 Units by mouth daily., Disp: , Rfl:  .  clotrimazole (LOTRIMIN AF) 1 % cream, Apply 1 application topically 2 (two) times daily., Disp: 30 g, Rfl: 0 .  cyclobenzaprine (FLEXERIL) 5 MG tablet, TAKE 1 TABLET(5 MG) BY MOUTH THREE TIMES DAILY AS NEEDED FOR MUSCLE SPASMS, Disp: 30 tablet, Rfl: 5 .  docusate sodium (COLACE) 100 MG capsule, Take 200 mg by mouth daily. , Disp: , Rfl:  .  hydrochlorothiazide (HYDRODIURIL) 25 MG tablet, Take 1 tablet (25 mg total) by mouth daily., Disp: 90 tablet, Rfl: 4 .  hydrocortisone (ANUSOL-HC) 2.5 % rectal cream, Place 1 application rectally 2 (two) times daily., Disp: 30 g, Rfl: 0 .  Hydrocortisone Micronized POWD, RINSE AND SPIT 5ML BY MOUTH EVERY 3 TO 4 HOURS AS NEEDED, Disp: 1 Bottle, Rfl: 4 .  ibuprofen (ADVIL,MOTRIN) 200 MG tablet, Take 400 mg by mouth every 6 (six) hours as needed for mild pain., Disp: , Rfl:  .  ketoconazole (NIZORAL) 2 % cream, Apply 1 application topically daily., Disp: 50 g, Rfl: 3 .  letrozole (  FEMARA) 2.5 MG tablet, Take 1 tablet (2.5 mg total) by mouth daily., Disp: 90 tablet, Rfl: 3 .  levothyroxine (SYNTHROID, LEVOTHROID) 75 MCG tablet, Take 1 tablet (75 mcg total) by mouth daily., Disp: 90 tablet, Rfl: 3 .  Loratadine (CLARITIN) 10 MG CAPS, Take by mouth., Disp: , Rfl:  .  Magnesium 400 MG TABS, Take 600 mg by mouth daily. , Disp: , Rfl:  .  meclizine (ANTIVERT) 25 MG tablet, TAKE ONE TABLET BY MOUTH EVERY 4 TO 6 HOURS AS NEEDED FOR NAUSEA, Disp: 30 tablet, Rfl: 3 .  Misc Natural Products (TURMERIC CURCUMIN) CAPS, Take 1  capsule by mouth daily., Disp: , Rfl:  .  lubiprostone (AMITIZA) 8 MCG capsule, Take 1 capsule (8 mcg total) by mouth daily. (Patient not taking: Reported on 04/07/2018), Disp: 30 capsule, Rfl: 3 No current facility-administered medications for this visit.   Facility-Administered Medications Ordered in Other Visits:  .  sodium chloride flush (NS) 0.9 % injection 10 mL, 10 mL, Intravenous, PRN, Lloyd Huger, MD, 10 mL at 02/09/16 0930 .  sodium chloride flush (NS) 0.9 % injection 10 mL, 10 mL, Intravenous, PRN, Lloyd Huger, MD, 10 mL at 06/21/16 0950  Review of Systems  Constitutional: Negative for appetite change, chills, fatigue and fever.  Respiratory: Negative for chest tightness and shortness of breath.   Cardiovascular: Negative for chest pain and palpitations.  Gastrointestinal: Positive for abdominal distention, abdominal pain, constipation, diarrhea, flatus and nausea. Negative for vomiting.  Genitourinary: Negative for dysuria and frequency.  Neurological: Negative for dizziness and weakness.    Social History   Tobacco Use  . Smoking status: Never Smoker  . Smokeless tobacco: Never Used  Substance Use Topics  . Alcohol use: No    Alcohol/week: 0.0 oz   Objective:   BP 118/78 (BP Location: Left Wrist, Patient Position: Sitting, Cuff Size: Large)   Pulse 63   Temp 98.4 F (36.9 C) (Oral)   Resp 18   Wt 234 lb (106.1 kg)   SpO2 96% Comment: room air  BMI 37.77 kg/m     Physical Exam  General Appearance:    Alert, cooperative, no distress  Eyes:    PERRL, conjunctiva/corneas clear, EOM's intact       Lungs:     Clear to auscultation bilaterally, respirations unlabored  Heart:    Regular rate and rhythm  Abdomen:   Slight tenderness deep palpation left lower quadrant. No CVA tenderness.    She refused to provide urine sample today.     Assessment & Plan:     1. LLQ pain Possible very mild case of diverticulitis.   2. Constipation, unspecified  constipation type Did well with samples of Amitiza,but could not afford prescription. Counseled on dietary treatment, continue fiber and stool softener, can take occasional Miralax, but advised not to take every day.        Lelon Huh, MD  [facility]

## 2018-04-07 NOTE — Patient Instructions (Signed)
   You can try Miralax two to three times a week as needed

## 2018-04-12 ENCOUNTER — Ambulatory Visit: Payer: Medicare Other | Attending: Family Medicine

## 2018-04-12 DIAGNOSIS — M25511 Pain in right shoulder: Secondary | ICD-10-CM | POA: Diagnosis not present

## 2018-04-12 DIAGNOSIS — G8929 Other chronic pain: Secondary | ICD-10-CM | POA: Diagnosis not present

## 2018-04-12 DIAGNOSIS — M6281 Muscle weakness (generalized): Secondary | ICD-10-CM | POA: Diagnosis not present

## 2018-04-12 DIAGNOSIS — M542 Cervicalgia: Secondary | ICD-10-CM | POA: Diagnosis not present

## 2018-04-12 NOTE — Therapy (Signed)
[facility] [address] [place], [address] Phone: [phone]   Fax:  [phone]  Physical Therapy Treatment  Patient Details  Name: Kaylee Reyes MRN: [id number] Date of Birth: 09-21-1951 Referring Provider: Lelon Huh, MD   Encounter Date: 04/12/2018  PT End of Session - 04/12/18 1350    Visit Number  5    Number of Visits  13    Date for PT Re-Evaluation  05/04/18    PT Start Time  1350    PT Stop Time  1432    PT Time Calculation (min)  42 min    Activity Tolerance  Patient tolerated treatment well    Behavior During Therapy  [facility] for tasks assessed/performed       Past Medical History:  Diagnosis Date  . Anemia   . Cancer ([place]) 09/2015   breast cancer  . GERD (gastroesophageal reflux disease)   . History of chicken pox   . Hyperlipidemia   . Hypertension   . Hypothyroidism   . Neuropathy of foot    bilateral  . PONV (postoperative nausea and vomiting)    exploratory surgery at [facility]  . Thyroid disease   . Tuberculosis exposure    Mother had TB.  Pt shows (+) on tests, but has never had.  . Vertigo    last episode over 1 yr ago  . Wears dentures    partial upper and lower    Past Surgical History:  Procedure Laterality Date  . ABDOMINAL HYSTERECTOMY  07/13/2013   Total. with BSO for postmenopausal bleeding and fibroids with large cervical polyp. Dr. Levy Sjogren and Dr. Glennon Mac at [facility]  . ABDOMINAL ULTRASOUND  04/17/2011   Hepatomegaly with borderline slenomegaly. Suggestive of fatty liver. s/p cholecystectomy. Portions of aorta obscured  . BREAST SURGERY Right 2011   BREAST BIOPSY  . CESAREAN SECTION  1988  . CHOLECYSTECTOMY  1990  . COLONOSCOPY WITH PROPOFOL N/A 09/01/2017   Procedure: COLONOSCOPY WITH PROPOFOL;  Surgeon: [name], MD;  Location: [facility];  Service: Gastroenterology;  Laterality: N/A;  . EXPLORATORY LAPAROTOMY     Left breast  . MASTECTOMY W/  SENTINEL NODE BIOPSY Bilateral 10/28/2015   Procedure: MASTECTOMY WITH SENTINEL LYMPH NODE BIOPSY;  Surgeon: Hubbard Robinson, MD;  Location: [facility];  Service: General;  Laterality: Bilateral;  . PORT-A-CATH REMOVAL Left 10/11/2016   Procedure: REMOVAL PORT-A-CATH;  Surgeon: Hubbard Robinson, MD;  Location: [facility];  Service: General;  Laterality: Left;  . PORTACATH PLACEMENT Left 12/23/2015   Procedure: INSERTION PORT-A-CATH;  Surgeon: Hubbard Robinson, MD;  Location: [facility];  Service: General;  Laterality: Left;  . WRIST SURGERY      There were no vitals filed for this visit.  Subjective Assessment - 04/12/18 1352    Subjective  R shoulder feels good. Not painfull or anything. Able to turn her head without pain but stiff.     Pertinent History  R shoulder pain. Pt states she does not want to do anything to her neck because her chiropractor is currently working on it. Pt had bilatral mastectomy in 2016 and went through chemo and radiation. Feels like her R shoulder pain came from the radiation. Pt states having a tendency to have rounded shoulders which could affect her shoulder.  R shoulder pain started within the last 7-8 months after radiation.  Does not currently have lymphedema.  R shoulder pain located posterior shoulder, arm, lateral shoulder,  medial shoulder blade.  Mastectomy incision located inferior to her chest to her R arm pit area per pt.  The chiropractic treatment helps with her neck pain but temporary.  Used to go to her chiropractor 3x/week. Currently goes there 1x/week now.  Pt was told not to use ice or heat due to her lymphedema.     Patient Stated Goals  To be able to turn her head better, fix her hair.     Currently in Pain?  No/denies    Pain Onset  More than a month ago                               PT Education - 04/12/18 1412    Education provided  Yes    Education Details  ther-ex    [employer]) Educated  Patient    Methods   Explanation;Demonstration;Tactile cues;Verbal cues    Comprehension  Returned demonstration;Verbalized understanding         Objectives  122R shoudler flexion with anterior lateral arm pain at start of session.   50 degrees R, 50 degrees L cervical rotation  Minimal resistance with exercises  Check arm for lymphedema secondary to hx    Medbridge Access Code: [facility]  5/6 FOTO  Therapeutic exercise  Seated L scapular retraction with manual resistance targeting the lower trap muscles 10x5 seconds for 3 sets  SeatedRscapular retractionwith manual resistance targeting the lower trap muscles10x5 seconds for 3sets  Gentle R scapular depression isometricswith manual resistance from PT10x3 with 5 second holds  Bilateral scaption with scapular retraction at corner wall 5x3  Standing bilateral scapular retraction resisting yellow band 5x5 seconds for 3 sets  127 degrees R shoulder flexion AROM after exercises   Improved exercise technique, movement at target joints, use of target muscles after min to mod verbal, visual, tactile cues.     Manual therapy  Seated gentle STM to R and L upper trap muscles to decrease muscle knots  135 degrees R shoulder flexion AROM afterwards   Improved R shoulder flexion AROM following treatment to promote lower trap strengthening, decreasing upper trap muscle tension, promoting scapular retraction. Pt tolerated session well without aggravation of symptoms.        PT Long Term Goals - 03/23/18 1737      PT LONG TERM GOAL #1   Title  Patient will have a decrease in R shoulder pain to 4/10 or less at worst to promote ability to fix her hair, raise her arm, and reach behind her back.     Baseline  8/10 at most for the past 2 months (03/23/2018)    Time  6    Period  Weeks    Status  New    Target Date  05/04/18      PT LONG TERM GOAL #2   Title  Patient will improve R shoulder ER strength by at least 1/2 MMT grade  to promote ability to raise her arm up with less pain.     Time  6    Period  Weeks    Status  New    Target Date  05/04/18      PT LONG TERM GOAL #3   Title  Patient will improve R shoulder flexion AROM to at least 130 degrees flexion to promote ability to raise her R arm and reach.     Baseline  114 degrees R shoulder flexion (03/23/2018)    Time  6    Period  Weeks    Status  New    Target Date  05/04/18      PT LONG TERM GOAL #4   Title  Patient will improve her shoulder FOTO score by at least 10 points as a demonstration of improved function.     Baseline  shoulder FOTO 55 (03/23/2018)    Time  6    Period  Weeks    Status  New    Target Date  05/04/18      PT LONG TERM GOAL #5   Title  Patient will improve her Quick Dash score by at least 10% as a demonstration of improved function.     Baseline  54.5% (03/23/2018)    Time  6    Period  Weeks    Status  New    Target Date  05/04/18            Plan - 04/12/18 1413    Clinical Impression Statement  Improved R shoulder flexion AROM following treatment to promote lower trap strengthening, decreasing upper trap muscle tension, promoting scapular retraction. Pt tolerated session well without aggravation of symptoms.     Rehab Potential  Fair    Clinical Impairments Affecting Rehab Potential  chronicity of condition, pain, hx of breast CA and radiation and chemotherapy    PT Frequency  2x / week    PT Duration  6 weeks    PT Treatment/Interventions  Therapeutic activities;Therapeutic exercise;Aquatic Therapy;Electrical Stimulation;Iontophoresis 4mg /ml Dexamethasone;Neuromuscular re-education;Patient/family education;Manual techniques;Dry needling    PT Next Visit Plan  gentle scapular, and rotator cuff strengthening, manual techniques, ROM    Consulted and Agree with Plan of Care  Patient       Patient will benefit from skilled therapeutic intervention in order to improve the following deficits and impairments:  Pain,  Postural dysfunction, Improper body mechanics, Decreased strength, Decreased range of motion  Visit Diagnosis: Chronic right shoulder pain  Muscle weakness (generalized)     Problem List Patient Active Problem List   Diagnosis Date Noted  . Constipation 04/07/2018  . Candida infection 05/18/2017  . Menopause 03/23/2017  . Rectocele 03/23/2017  . Cystocele, midline 03/23/2017  . Vaginal atrophy 03/23/2017  . Malignant neoplasm of right breast in female, estrogen receptor positive ([place])   . Left breast cancer with T3 tumor, >5 cm in greatest dimension ([place])   . S/P bilateral mastectomy 10/28/2015  . Left breast lump 10/15/2015  . Fatty infiltration of liver 10/01/2015  . Gonalgia 10/01/2015  . Body tinea 10/01/2015  . Invasive lobular carcinoma of right breast, stage 2 ([place]) 10/01/2015  . Hypothyroidism 09/25/2015  . Morbid obesity ([place]) 09/25/2015  . Hypercholesterolemia 09/25/2015  . Hypertension 09/25/2015  . Allergic rhinitis 09/25/2015  . GERD (gastroesophageal reflux disease) 09/25/2015  . Alopecia 09/25/2015  . Lump or mass in breast 09/25/2015  . History of peptic ulcer disease 09/25/2015  . Eczema 09/25/2015    Joneen Boers PT, DPT   04/12/2018, 2:41 PM  [facility] [address] [place], [address] Phone: [phone]   Fax:  [phone]  Name: HARUKO MERSCH MRN: [id number] Date of Birth: 1951/06/07

## 2018-04-17 ENCOUNTER — Ambulatory Visit: Payer: Medicare Other

## 2018-04-17 DIAGNOSIS — M6281 Muscle weakness (generalized): Secondary | ICD-10-CM | POA: Diagnosis not present

## 2018-04-17 DIAGNOSIS — G8929 Other chronic pain: Secondary | ICD-10-CM

## 2018-04-17 DIAGNOSIS — M25511 Pain in right shoulder: Principal | ICD-10-CM

## 2018-04-17 DIAGNOSIS — M542 Cervicalgia: Secondary | ICD-10-CM | POA: Diagnosis not present

## 2018-04-17 NOTE — Therapy (Signed)
[facility] [address], [place], [address] Phone: [phone]   Fax:  [phone]  Physical Therapy Treatment  Patient Details  Name: Kaylee Reyes MRN: [id number] Date of Birth: June 21, 1951 Referring Provider: Lelon Huh, MD   Encounter Date: 04/17/2018  PT End of Session - 04/17/18 1348    Visit Number  6    Number of Visits  13    Date for PT Re-Evaluation  05/04/18    PT Start Time  1324    PT Stop Time  1440    PT Time Calculation (min)  52 min    Activity Tolerance  Patient tolerated treatment well    Behavior During Therapy  [facility] for tasks assessed/performed       Past Medical History:  Diagnosis Date  . Anemia   . Cancer ([place]) 09/2015   breast cancer  . GERD (gastroesophageal reflux disease)   . History of chicken pox   . Hyperlipidemia   . Hypertension   . Hypothyroidism   . Neuropathy of foot    bilateral  . PONV (postoperative nausea and vomiting)    exploratory surgery at [facility]  . Thyroid disease   . Tuberculosis exposure    Mother had TB.  Pt shows (+) on tests, but has never had.  . Vertigo    last episode over 1 yr ago  . Wears dentures    partial upper and lower    Past Surgical History:  Procedure Laterality Date  . ABDOMINAL HYSTERECTOMY  07/13/2013   Total. with BSO for postmenopausal bleeding and fibroids with large cervical polyp. Dr. Levy Sjogren and Dr. Glennon Mac at [facility]  . ABDOMINAL ULTRASOUND  04/17/2011   Hepatomegaly with borderline slenomegaly. Suggestive of fatty liver. s/p cholecystectomy. Portions of aorta obscured  . BREAST SURGERY Right 2011   BREAST BIOPSY  . CESAREAN SECTION  1988  . CHOLECYSTECTOMY  1990  . COLONOSCOPY WITH PROPOFOL N/A 09/01/2017   Procedure: COLONOSCOPY WITH PROPOFOL;  Surgeon: [name], MD;  Location: [place];  Service: Gastroenterology;  Laterality: N/A;  . EXPLORATORY LAPAROTOMY     Left breast  . MASTECTOMY W/  SENTINEL NODE BIOPSY Bilateral 10/28/2015   Procedure: MASTECTOMY WITH SENTINEL LYMPH NODE BIOPSY;  Surgeon: Hubbard Robinson, MD;  Location: [facility];  Service: General;  Laterality: Bilateral;  . PORT-A-CATH REMOVAL Left 10/11/2016   Procedure: REMOVAL PORT-A-CATH;  Surgeon: Hubbard Robinson, MD;  Location: [facility];  Service: General;  Laterality: Left;  . PORTACATH PLACEMENT Left 12/23/2015   Procedure: INSERTION PORT-A-CATH;  Surgeon: Hubbard Robinson, MD;  Location: [facility];  Service: General;  Laterality: Left;  . WRIST SURGERY      There were no vitals filed for this visit.  Subjective Assessment - 04/17/18 1349    Subjective  R shoulder is ok. Had a set back Saturday. Had a fall when her seat cushion gave way. Neck bothered her Saturday night, stiff on Sunday.  R shoulder feels ok. No R shoulder pain currently, just stiffness.  No neck pain, just stiffness.  Did not do her HEP yesterday due to the fall.     Pertinent History  R shoulder pain. Pt states she does not want to do anything to her neck because her chiropractor is currently working on it. Pt had bilatral mastectomy in 2016 and went through chemo and radiation. Feels like her R shoulder pain came from the radiation. Pt states having a  tendency to have rounded shoulders which could affect her shoulder.  R shoulder pain started within the last 7-8 months after radiation.  Does not currently have lymphedema.  R shoulder pain located posterior shoulder, arm, lateral shoulder, medial shoulder blade.  Mastectomy incision located inferior to her chest to her R arm pit area per pt.  The chiropractic treatment helps with her neck pain but temporary.  Used to go to her chiropractor 3x/week. Currently goes there 1x/week now.  Pt was told not to use ice or heat due to her lymphedema.     Patient Stated Goals  To be able to turn her head better, fix her hair.     Currently in Pain?  No/denies    Pain Score  0-No pain    Pain Onset  More  than a month ago                               PT Education - 04/17/18 1447    Education provided  Yes    Education Details  ther-ex    [employer]) Educated  Patient    Methods  Explanation;Demonstration;Tactile cues;Verbal cues    Comprehension  Returned demonstration;Verbalized understanding         Objectives  123R shoulder flexion with anterior lateral arm painat start of session.  40degrees R, 48 degrees L cervical rotationat start of session  Minimal resistance with exercises  Check arm for lymphedema secondary to hx    Medbridge Access Code: [email or web address]  6/6 FOTO    Manual therapy  Seated gentle STM to R and L upper trap musclesto decrease muscle knots     Therapeutic exercise  Seated L scapular retraction with manual resistance targeting the lower trap muscles 10x5 seconds for 3 sets  SeatedRscapular retractionwith manual resistance targeting the lower trap muscles10x5 seconds for 3sets  Seated thoracic extension over chair 10x3 with 5 second holds for 3 sets   Standing bilateral scapular retraction resisting yellow band 5x5 seconds for 4 sets  Standing R shoulder flexion with scapular retraction and manual resistance to activate lower trap muscle 10x   125 degrees R shoulder flexion AROM  50 degrees R cervical rotation, 40 degrees L cervical rotation  Standing L shoulder flexion with scapular retraction and manual resistance to activate lower trap muscle 10x  50 degrees L cervical rotation, 40 degrees R cervical rotation   Standing bilateral scapular retraction with manual resistance to lower trap muscles 10x5 seconds  50 degrees R and L cervical rotation AROM afterwards   Work on ER muscle strengthening and flexion/scaption gentle stretching next visit if appropriate.   Improved exercise technique, movement at target joints, use of target muscles after min to mod verbal, visual, tactile cues.     Stiff end feel with R shoulder flexion ROM but able to improve ROM with addition of scapular retraction when raising her R arm up. Improved cervical rotation ROM following treatment to decrease upper trap muscle tension and improve lower trap muscle strengthening.     PT Long Term Goals - 03/23/18 1737      PT LONG TERM GOAL #1   Title  Patient will have a decrease in R shoulder pain to 4/10 or less at worst to promote ability to fix her hair, raise her arm, and reach behind her back.     Baseline  8/10 at most for the past 2 months (03/23/2018)    Time  6    Period  Weeks    Status  New    Target Date  05/04/18      PT LONG TERM GOAL #2   Title  Patient will improve R shoulder ER strength by at least 1/2 MMT grade to promote ability to raise her arm up with less pain.     Time  6    Period  Weeks    Status  New    Target Date  05/04/18      PT LONG TERM GOAL #3   Title  Patient will improve R shoulder flexion AROM to at least 130 degrees flexion to promote ability to raise her R arm and reach.     Baseline  114 degrees R shoulder flexion (03/23/2018)    Time  6    Period  Weeks    Status  New    Target Date  05/04/18      PT LONG TERM GOAL #4   Title  Patient will improve her shoulder FOTO score by at least 10 points as a demonstration of improved function.     Baseline  shoulder FOTO 55 (03/23/2018)    Time  6    Period  Weeks    Status  New    Target Date  05/04/18      PT LONG TERM GOAL #5   Title  Patient will improve her Quick Dash score by at least 10% as a demonstration of improved function.     Baseline  54.5% (03/23/2018)    Time  6    Period  Weeks    Status  New    Target Date  05/04/18            Plan - 04/17/18 1348    Clinical Impression Statement  Stiff end feel with R shoulder flexion ROM but able to improve ROM with addition of scapular retraction when raising her R arm up. Improved cervical rotation ROM following treatment to decrease upper  trap muscle tension and improve lower trap muscle strengthening.     Rehab Potential  Fair    Clinical Impairments Affecting Rehab Potential  chronicity of condition, pain, hx of breast CA and radiation and chemotherapy    PT Frequency  2x / week    PT Duration  6 weeks    PT Treatment/Interventions  Therapeutic activities;Therapeutic exercise;Aquatic Therapy;Electrical Stimulation;Iontophoresis 4mg /ml Dexamethasone;Neuromuscular re-education;Patient/family education;Manual techniques;Dry needling    PT Next Visit Plan  gentle scapular, and rotator cuff strengthening, manual techniques, ROM    Consulted and Agree with Plan of Care  Patient       Patient will benefit from skilled therapeutic intervention in order to improve the following deficits and impairments:  Pain, Postural dysfunction, Improper body mechanics, Decreased strength, Decreased range of motion  Visit Diagnosis: Chronic right shoulder pain  Muscle weakness (generalized)     Problem List Patient Active Problem List   Diagnosis Date Noted  . Constipation 04/07/2018  . Candida infection 05/18/2017  . Menopause 03/23/2017  . Rectocele 03/23/2017  . Cystocele, midline 03/23/2017  . Vaginal atrophy 03/23/2017  . Malignant neoplasm of right breast in female, estrogen receptor positive ([place])   . Left breast cancer with T3 tumor, >5 cm in greatest dimension ([place])   . S/P bilateral mastectomy 10/28/2015  . Left breast lump 10/15/2015  . Fatty infiltration of liver 10/01/2015  . Gonalgia 10/01/2015  . Body tinea 10/01/2015  . Invasive lobular carcinoma of right breast, stage  2 ([place]) 10/01/2015  . Hypothyroidism 09/25/2015  . Morbid obesity ([place]) 09/25/2015  . Hypercholesterolemia 09/25/2015  . Hypertension 09/25/2015  . Allergic rhinitis 09/25/2015  . GERD (gastroesophageal reflux disease) 09/25/2015  . Alopecia 09/25/2015  . Lump or mass in breast 09/25/2015  . History of peptic ulcer disease 09/25/2015  . Eczema  09/25/2015   Joneen Boers PT, DPT   04/17/2018, 2:53 PM  [facility] [address] [place], [address] Phone: [phone]   Fax:  [phone]  Name: VILDA ZOLLNER MRN: [id number] Date of Birth: 07/04/1951

## 2018-04-19 ENCOUNTER — Ambulatory Visit: Payer: Medicare Other

## 2018-04-19 DIAGNOSIS — M9901 Segmental and somatic dysfunction of cervical region: Secondary | ICD-10-CM | POA: Diagnosis not present

## 2018-04-19 DIAGNOSIS — M6281 Muscle weakness (generalized): Secondary | ICD-10-CM

## 2018-04-19 DIAGNOSIS — G8929 Other chronic pain: Secondary | ICD-10-CM

## 2018-04-19 DIAGNOSIS — M25511 Pain in right shoulder: Secondary | ICD-10-CM | POA: Diagnosis not present

## 2018-04-19 DIAGNOSIS — R200 Anesthesia of skin: Secondary | ICD-10-CM | POA: Diagnosis not present

## 2018-04-19 DIAGNOSIS — M542 Cervicalgia: Secondary | ICD-10-CM | POA: Diagnosis not present

## 2018-04-19 NOTE — Therapy (Signed)
[facility] [address] [place], [address] Phone: [phone]   Fax:  [phone]  Physical Therapy Evaluation  Patient Details  Name: Kaylee Reyes MRN: [id number] Date of Birth: 03-11-1951 Referring Provider: Lelon Huh, MD   Encounter Date: 04/19/2018  PT End of Session - 04/19/18 0935    Visit Number  7    Number of Visits  13    Date for PT Re-Evaluation  05/04/18    PT Start Time  0936    PT Stop Time  1035    PT Time Calculation (min)  59 min    Activity Tolerance  Patient tolerated treatment well    Behavior During Therapy  [facility] for tasks assessed/performed       Past Medical History:  Diagnosis Date  . Anemia   . Cancer ([place]) 09/2015   breast cancer  . GERD (gastroesophageal reflux disease)   . History of chicken pox   . Hyperlipidemia   . Hypertension   . Hypothyroidism   . Neuropathy of foot    bilateral  . PONV (postoperative nausea and vomiting)    exploratory surgery at [facility]  . Thyroid disease   . Tuberculosis exposure    Mother had TB.  Pt shows (+) on tests, but has never had.  . Vertigo    last episode over 1 yr ago  . Wears dentures    partial upper and lower    Past Surgical History:  Procedure Laterality Date  . ABDOMINAL HYSTERECTOMY  07/13/2013   Total. with BSO for postmenopausal bleeding and fibroids with large cervical polyp. Dr. Levy Sjogren and Dr. Glennon Mac at [facility]  . ABDOMINAL ULTRASOUND  04/17/2011   Hepatomegaly with borderline slenomegaly. Suggestive of fatty liver. s/p cholecystectomy. Portions of aorta obscured  . BREAST SURGERY Right 2011   BREAST BIOPSY  . CESAREAN SECTION  1988  . CHOLECYSTECTOMY  1990  . COLONOSCOPY WITH PROPOFOL N/A 09/01/2017   Procedure: COLONOSCOPY WITH PROPOFOL;  Surgeon: [name], MD;  Location: [place];  Service: Gastroenterology;  Laterality: N/A;  . EXPLORATORY LAPAROTOMY     Left breast  . MASTECTOMY W/  SENTINEL NODE BIOPSY Bilateral 10/28/2015   Procedure: MASTECTOMY WITH SENTINEL LYMPH NODE BIOPSY;  Surgeon: Hubbard Robinson, MD;  Location: [facility];  Service: General;  Laterality: Bilateral;  . PORT-A-CATH REMOVAL Left 10/11/2016   Procedure: REMOVAL PORT-A-CATH;  Surgeon: Hubbard Robinson, MD;  Location: [facility];  Service: General;  Laterality: Left;  . PORTACATH PLACEMENT Left 12/23/2015   Procedure: INSERTION PORT-A-CATH;  Surgeon: Hubbard Robinson, MD;  Location: [facility];  Service: General;  Laterality: Left;  . WRIST SURGERY      There were no vitals filed for this visit.   Subjective Assessment - 04/19/18 0937    Subjective  R shoulder is stiff. It's my neck. Shoulder not bothering her. Sees her chiropractor every 2 weeks. The neck felt relaxed after last visit. Felt stiff the next morning.  Uses a thick memory foam pillow. Pt states that her neck bothers her, shoulders are ok.     Pertinent History  R shoulder pain. Pt states she does not want to do anything to her neck because her chiropractor is currently working on it. Pt had bilatral mastectomy in 2016 and went through chemo and radiation. Feels like her R shoulder pain came from the radiation. Pt states having a tendency to have rounded shoulders which could affect  her shoulder.  R shoulder pain started within the last 7-8 months after radiation.  Does not currently have lymphedema.  R shoulder pain located posterior shoulder, arm, lateral shoulder, medial shoulder blade.  Mastectomy incision located inferior to her chest to her R arm pit area per pt.  The chiropractic treatment helps with her neck pain but temporary.  Used to go to her chiropractor 3x/week. Currently goes there 1x/week now.  Pt was told not to use ice or heat due to her lymphedema.     Patient Stated Goals  To be able to turn her head better, fix her hair.     Currently in Pain?  No/denies    Pain Score  -- neck and shoulder stiffness    Pain Onset  More than  a month ago         [facility] PT Assessment - 04/19/18 1048      Observation/Other Assessments   Focus on Therapeutic Outcomes (FOTO)   53 pt might have answered previous questions based on her neck.    Quick DASH   27.3%                Objective measurements completed on examination: See above findings.              PT Education - 04/19/18 0946    Education provided  Yes    Education Details  ther-ex    Person(s) Educated  Patient    Methods  Explanation;Demonstration;Tactile cues;Verbal cues    Comprehension  Returned demonstration;Verbalized understanding         Medbridge Access Code: [facility]  7/12 FOTO  Cervical rotation at start of session  R 42 degrees  L 44 degrees   Manual therapy  Seated gentle STM toR and Lupper trap musclesto decrease muscle knots   Cervical rotation: 48 degrees R, 55 degrees L. Decreased neck discomfort.     Therapeutic exercise  After manual therapy: seated chin tucks to promote gentle thoracic extension to promote scapular retraction to promote improved ability to raise R arm up more comfortably. 10x5 seconds for 2 sets   Seated bilateral shoulder ER with scapular retraction resisting yellow band 10x3  Standing bilateral scapular retraction with manual resistance to lower trap muscles 10x5 seconds for 3 sets  50 degrees R cervical rotation, 55 degrees L  135 degrees R shoulder flexion AROM  Supine R shoulder flexion AROM 10x3 to promote gentle stretch  Supine gentle manually resisted R shoulder extension from flexed position 10x  140 degrees R shoulder flexion AROM in standing afterwards  Improved exercise technique, movement at target joints, use of target muscles after min to mod verbal, visual, tactile cues.    Improved R shoulder flexion AROM to 140 degrees and cervical ROM to 50-58 degrees after session. Continued working on decreasing bilateral upper trap muscle tension, improving lower and  middle trap muscle strengthening, improving ER muscle use and gentle R shoulder flexion stretch to promote ability to raise her R arm as well as ability to turn her head more comfortably. Pt also demonstrates improved Quick Dash score suggesting improved ability to perform functional tasks with R UE. Slight decrease in shoulder FOTO score in which pt might have answered the questions based on her neck. Pt tolerated session well without aggravation of symptoms.         PT Long Term Goals - 03/23/18 1737      PT LONG TERM GOAL #1   Title  Patient will have  a decrease in R shoulder pain to 4/10 or less at worst to promote ability to fix her hair, raise her arm, and reach behind her back.     Baseline  8/10 at most for the past 2 months (03/23/2018)    Time  6    Period  Weeks    Status  New    Target Date  05/04/18      PT LONG TERM GOAL #2   Title  Patient will improve R shoulder ER strength by at least 1/2 MMT grade to promote ability to raise her arm up with less pain.     Time  6    Period  Weeks    Status  New    Target Date  05/04/18      PT LONG TERM GOAL #3   Title  Patient will improve R shoulder flexion AROM to at least 130 degrees flexion to promote ability to raise her R arm and reach.     Baseline  114 degrees R shoulder flexion (03/23/2018)    Time  6    Period  Weeks    Status  New    Target Date  05/04/18      PT LONG TERM GOAL #4   Title  Patient will improve her shoulder FOTO score by at least 10 points as a demonstration of improved function.     Baseline  shoulder FOTO 55 (03/23/2018)    Time  6    Period  Weeks    Status  New    Target Date  05/04/18      PT LONG TERM GOAL #5   Title  Patient will improve her Quick Dash score by at least 10% as a demonstration of improved function.     Baseline  54.5% (03/23/2018)    Time  6    Period  Weeks    Status  New    Target Date  05/04/18             Plan - 04/19/18 1006    Clinical Impression  Statement  Improved R shoulder flexion AROM to 140 degrees and cervical ROM to 50-58 degrees after session. Continued working on decreasing bilateral upper trap muscle tension, improving lower and middle trap muscle strengthening, improving ER muscle use and gentle R shoulder flexion stretch to promote ability to raise her R arm as well as ability to turn her head more comfortably. Pt also demonstrates improved Quick Dash score suggesting improved ability to perform functional tasks with R UE. Slight decrease in shoulder FOTO score in which pt might have answered the questions based on her neck. Pt tolerated session well without aggravation of symptoms.     Rehab Potential  Fair    Clinical Impairments Affecting Rehab Potential  chronicity of condition, pain, hx of breast CA and radiation and chemotherapy    PT Frequency  2x / week    PT Duration  6 weeks    PT Treatment/Interventions  Therapeutic activities;Therapeutic exercise;Aquatic Therapy;Electrical Stimulation;Iontophoresis 4mg /ml Dexamethasone;Neuromuscular re-education;Patient/family education;Manual techniques;Dry needling    PT Next Visit Plan  gentle scapular, and rotator cuff strengthening, manual techniques, ROM    Consulted and Agree with Plan of Care  Patient       Patient will benefit from skilled therapeutic intervention in order to improve the following deficits and impairments:  Pain, Postural dysfunction, Improper body mechanics, Decreased strength, Decreased range of motion  Visit Diagnosis: Chronic right shoulder pain  Muscle weakness (  generalized)     Problem List Patient Active Problem List   Diagnosis Date Noted  . Constipation 04/07/2018  . Candida infection 05/18/2017  . Menopause 03/23/2017  . Rectocele 03/23/2017  . Cystocele, midline 03/23/2017  . Vaginal atrophy 03/23/2017  . Malignant neoplasm of right breast in female, estrogen receptor positive ([place])   . Left breast cancer with T3 tumor, >5 cm in  greatest dimension ([place])   . S/P bilateral mastectomy 10/28/2015  . Left breast lump 10/15/2015  . Fatty infiltration of liver 10/01/2015  . Gonalgia 10/01/2015  . Body tinea 10/01/2015  . Invasive lobular carcinoma of right breast, stage 2 ([place]) 10/01/2015  . Hypothyroidism 09/25/2015  . Morbid obesity ([place]) 09/25/2015  . Hypercholesterolemia 09/25/2015  . Hypertension 09/25/2015  . Allergic rhinitis 09/25/2015  . GERD (gastroesophageal reflux disease) 09/25/2015  . Alopecia 09/25/2015  . Lump or mass in breast 09/25/2015  . History of peptic ulcer disease 09/25/2015  . Eczema 09/25/2015   Joneen Boers PT, DPT   04/19/2018, 11:00 AM  [facility] [address], [place], [address] Phone: [phone]   Fax:  [phone]  Name: Kaylee Reyes MRN: [id number] Date of Birth: [birth year]

## 2018-04-24 ENCOUNTER — Ambulatory Visit: Payer: Medicare Other

## 2018-04-24 DIAGNOSIS — M25511 Pain in right shoulder: Secondary | ICD-10-CM | POA: Diagnosis not present

## 2018-04-24 DIAGNOSIS — G8929 Other chronic pain: Secondary | ICD-10-CM

## 2018-04-24 DIAGNOSIS — M6281 Muscle weakness (generalized): Secondary | ICD-10-CM

## 2018-04-24 DIAGNOSIS — M542 Cervicalgia: Secondary | ICD-10-CM | POA: Diagnosis not present

## 2018-04-24 NOTE — Therapy (Signed)
[facility] [address] [place], [address] Phone: [phone]   Fax:  [phone]  Physical Therapy Treatment  Patient Details  Name: Kaylee Reyes MRN: [id number] Date of Birth: Sep 26, 1951 Referring Provider: Lelon Huh, MD   Encounter Date: 04/24/2018  PT End of Session - 04/24/18 1523    Visit Number  8    Number of Visits  13    Date for PT Re-Evaluation  05/04/18    PT Start Time  6606    PT Stop Time  1601    PT Time Calculation (min)  38 min    Activity Tolerance  Patient tolerated treatment well    Behavior During Therapy  [facility] for tasks assessed/performed       Past Medical History:  Diagnosis Date  . Anemia   . Cancer ([place]) 09/2015   breast cancer  . GERD (gastroesophageal reflux disease)   . History of chicken pox   . Hyperlipidemia   . Hypertension   . Hypothyroidism   . Neuropathy of foot    bilateral  . PONV (postoperative nausea and vomiting)    exploratory surgery at [facility]  . Thyroid disease   . Tuberculosis exposure    Mother had TB.  Pt shows (+) on tests, but has never had.  . Vertigo    last episode over 1 yr ago  . Wears dentures    partial upper and lower    Past Surgical History:  Procedure Laterality Date  . ABDOMINAL HYSTERECTOMY  07/13/2013   Total. with BSO for postmenopausal bleeding and fibroids with large cervical polyp. Dr. Levy Sjogren and Dr. Glennon Mac at [facility]  . ABDOMINAL ULTRASOUND  04/17/2011   Hepatomegaly with borderline slenomegaly. Suggestive of fatty liver. s/p cholecystectomy. Portions of aorta obscured  . BREAST SURGERY Right 2011   BREAST BIOPSY  . CESAREAN SECTION  1988  . CHOLECYSTECTOMY  1990  . COLONOSCOPY WITH PROPOFOL N/A 09/01/2017   Procedure: COLONOSCOPY WITH PROPOFOL;  Surgeon: [name], MD;  Location: [facility];  Service: Gastroenterology;  Laterality: N/A;  . EXPLORATORY LAPAROTOMY     Left breast  . MASTECTOMY W/  SENTINEL NODE BIOPSY Bilateral 10/28/2015   Procedure: MASTECTOMY WITH SENTINEL LYMPH NODE BIOPSY;  Surgeon: Hubbard Robinson, MD;  Location: [facility];  Service: General;  Laterality: Bilateral;  . PORT-A-CATH REMOVAL Left 10/11/2016   Procedure: REMOVAL PORT-A-CATH;  Surgeon: Hubbard Robinson, MD;  Location: [facility];  Service: General;  Laterality: Left;  . PORTACATH PLACEMENT Left 12/23/2015   Procedure: INSERTION PORT-A-CATH;  Surgeon: Hubbard Robinson, MD;  Location: [facility];  Service: General;  Laterality: Left;  . WRIST SURGERY      There were no vitals filed for this visit.  Subjective Assessment - 04/24/18 1524    Subjective  R shoulder is ok. R neck felt like it was spasming yesterday. R neck feels stiff today. No R shoulder pain. Pt states that she feels like she answered the FOTO questions based on her lymphedema instead of shoulder pain.     Pertinent History  R shoulder pain. Pt states she does not want to do anything to her neck because her chiropractor is currently working on it. Pt had bilatral mastectomy in 2016 and went through chemo and radiation. Feels like her R shoulder pain came from the radiation. Pt states having a tendency to have rounded shoulders which could affect her shoulder.  R shoulder pain started  within the last 7-8 months after radiation.  Does not currently have lymphedema.  R shoulder pain located posterior shoulder, arm, lateral shoulder, medial shoulder blade.  Mastectomy incision located inferior to her chest to her R arm pit area per pt.  The chiropractic treatment helps with her neck pain but temporary.  Used to go to her chiropractor 3x/week. Currently goes there 1x/week now.  Pt was told not to use ice or heat due to her lymphedema.     Patient Stated Goals  To be able to turn her head better, fix her hair.     Currently in Pain?  No/denies    Pain Onset  More than a month ago                               PT Education -  04/24/18 1547    Education provided  Yes    Education Details  ther-ex    [employer]) Educated  Patient    Methods  Explanation;Demonstration;Tactile cues;Verbal cues    Comprehension  Returned demonstration;Verbalized understanding       No latex allergies    Medbridge Access Code: [facility]  8/12 FOTO  Cervical rotation at start of session             R 45 degrees             L 42 degrees   Pt states that the supine R shoulder flexion exercise bothered her scar tissue area for a couple of days. Feels better now.     Therapeutic exercise    Standing bilateral scapular retraction with manual resistance to lower trap muscles 10x5 seconds   Then 10x2 with 10 second holds  Seated bilateral shoulder ER with scapular retraction resisting yellow band 5x5 seconds for 3 sets               127 degrees R shoulder flexion AROM  Seated R scapular depression isometrics with R forearm on arm rest to help decrease R rhomboid muscle tension 10x5 seconds    Improved exercise technique, movement at target joints, use of target muscles after min to mod verbal, visual, tactile cues.      Manual therapy   Seated gentle STM toR and Lupper trap musclesto decrease muscle knots  Pt states neck feels better afterwards   131 R shoulder flexion AROM afterwards.     Decreased neck tightness sensation and improved R shoulder flexion AROM after treatment to decrease upper trap muscle tension. Continued working on lower trap muscle use to help decrease upper trap muscle tension.      PT Long Term Goals - 03/23/18 1737      PT LONG TERM GOAL #1   Title  Patient will have a decrease in R shoulder pain to 4/10 or less at worst to promote ability to fix her hair, raise her arm, and reach behind her back.     Baseline  8/10 at most for the past 2 months (03/23/2018)    Time  6    Period  Weeks    Status  New    Target Date  05/04/18      PT LONG TERM GOAL #2   Title  Patient  will improve R shoulder ER strength by at least 1/2 MMT grade to promote ability to raise her arm up with less pain.     Time  6    Period  Weeks  Status  New    Target Date  05/04/18      PT LONG TERM GOAL #3   Title  Patient will improve R shoulder flexion AROM to at least 130 degrees flexion to promote ability to raise her R arm and reach.     Baseline  114 degrees R shoulder flexion (03/23/2018)    Time  6    Period  Weeks    Status  New    Target Date  05/04/18      PT LONG TERM GOAL #4   Title  Patient will improve her shoulder FOTO score by at least 10 points as a demonstration of improved function.     Baseline  shoulder FOTO 55 (03/23/2018)    Time  6    Period  Weeks    Status  New    Target Date  05/04/18      PT LONG TERM GOAL #5   Title  Patient will improve her Quick Dash score by at least 10% as a demonstration of improved function.     Baseline  54.5% (03/23/2018)    Time  6    Period  Weeks    Status  New    Target Date  05/04/18            Plan - 04/24/18 1547    Clinical Impression Statement  Decreased neck tightness sensation and improved R shoulder flexion AROM after treatment to decrease upper trap muscle tension. Continued working on lower trap muscle use to help decrease upper trap muscle tension.    Rehab Potential  Fair    Clinical Impairments Affecting Rehab Potential  chronicity of condition, pain, hx of breast CA and radiation and chemotherapy    PT Frequency  2x / week    PT Duration  6 weeks    PT Treatment/Interventions  Therapeutic activities;Therapeutic exercise;Aquatic Therapy;Electrical Stimulation;Iontophoresis 4mg /ml Dexamethasone;Neuromuscular re-education;Patient/family education;Manual techniques;Dry needling    PT Next Visit Plan  gentle scapular, and rotator cuff strengthening, manual techniques, ROM    Consulted and Agree with Plan of Care  Patient       Patient will benefit from skilled therapeutic intervention in order to  improve the following deficits and impairments:  Pain, Postural dysfunction, Improper body mechanics, Decreased strength, Decreased range of motion  Visit Diagnosis: Chronic right shoulder pain  Muscle weakness (generalized)     Problem List Patient Active Problem List   Diagnosis Date Noted  . Constipation 04/07/2018  . Candida infection 05/18/2017  . Menopause 03/23/2017  . Rectocele 03/23/2017  . Cystocele, midline 03/23/2017  . Vaginal atrophy 03/23/2017  . Malignant neoplasm of right breast in female, estrogen receptor positive ([place])   . Left breast cancer with T3 tumor, >5 cm in greatest dimension ([place])   . S/P bilateral mastectomy 10/28/2015  . Left breast lump 10/15/2015  . Fatty infiltration of liver 10/01/2015  . Gonalgia 10/01/2015  . Body tinea 10/01/2015  . Invasive lobular carcinoma of right breast, stage 2 ([place]) 10/01/2015  . Hypothyroidism 09/25/2015  . Morbid obesity ([place]) 09/25/2015  . Hypercholesterolemia 09/25/2015  . Hypertension 09/25/2015  . Allergic rhinitis 09/25/2015  . GERD (gastroesophageal reflux disease) 09/25/2015  . Alopecia 09/25/2015  . Lump or mass in breast 09/25/2015  . History of peptic ulcer disease 09/25/2015  . Eczema 09/25/2015    Joneen Boers PT, DPT   04/24/2018, 8:41 PM  [facility] [address],  [place], [address] Phone: [phone]   Fax:  [phone]  Name: Kaylee Reyes MRN: [id number] Date of Birth: 11/10/1951

## 2018-04-24 NOTE — Patient Instructions (Signed)
Resisted External Rotation: in Neutral - Bilateral    Sit or stand, tubing in both hands, elbows slightly forward, bent to 90, forearms forward. Pinch shoulder blades together and rotate forearms out. Keep elbows at sides. 5 second holds Repeat _5___ times per set. Do _3___ sets per session. Do __1__ sessions per day.  http://orth.exer.us/966   Copyright  VHI. All rights reserved.

## 2018-04-27 ENCOUNTER — Ambulatory Visit: Payer: Medicare Other

## 2018-04-27 DIAGNOSIS — M6281 Muscle weakness (generalized): Secondary | ICD-10-CM

## 2018-04-27 DIAGNOSIS — M25511 Pain in right shoulder: Principal | ICD-10-CM

## 2018-04-27 DIAGNOSIS — G8929 Other chronic pain: Secondary | ICD-10-CM

## 2018-04-27 DIAGNOSIS — M542 Cervicalgia: Secondary | ICD-10-CM | POA: Diagnosis not present

## 2018-04-27 NOTE — Therapy (Signed)
[facility] [address] [place], [address] Phone: [phone]   Fax:  [phone]  Physical Therapy Treatment  Patient Details  Name: Kaylee Reyes MRN: [id number] Date of Birth: [birth year] Referring Provider: Lelon Huh, MD   Encounter Date: 04/27/2018  PT End of Session - 04/27/18 1649    Visit Number  9    Number of Visits  13    Date for PT Re-Evaluation  05/04/18    PT Start Time  6144    PT Stop Time  1732    PT Time Calculation (min)  42 min    Activity Tolerance  Patient tolerated treatment well    Behavior During Therapy  [facility] for tasks assessed/performed       Past Medical History:  Diagnosis Date  . Anemia   . Cancer ([place]) 09/2015   breast cancer  . GERD (gastroesophageal reflux disease)   . History of chicken pox   . Hyperlipidemia   . Hypertension   . Hypothyroidism   . Neuropathy of foot    bilateral  . PONV (postoperative nausea and vomiting)    exploratory surgery at [facility]  . Thyroid disease   . Tuberculosis exposure    Mother had TB.  Pt shows (+) on tests, but has never had.  . Vertigo    last episode over 1 yr ago  . Wears dentures    partial upper and lower    Past Surgical History:  Procedure Laterality Date  . ABDOMINAL HYSTERECTOMY  07/13/2013   Total. with BSO for postmenopausal bleeding and fibroids with large cervical polyp. Dr. Levy Sjogren and Dr. Glennon Mac at [facility]  . ABDOMINAL ULTRASOUND  04/17/2011   Hepatomegaly with borderline slenomegaly. Suggestive of fatty liver. s/p cholecystectomy. Portions of aorta obscured  . BREAST SURGERY Right 2011   BREAST BIOPSY  . CESAREAN SECTION  1988  . CHOLECYSTECTOMY  1990  . COLONOSCOPY WITH PROPOFOL N/A 09/01/2017   Procedure: COLONOSCOPY WITH PROPOFOL;  Surgeon: [name], MD;  Location: [facility];  Service: Gastroenterology;  Laterality: N/A;  . EXPLORATORY LAPAROTOMY     Left breast  . MASTECTOMY W/  SENTINEL NODE BIOPSY Bilateral 10/28/2015   Procedure: MASTECTOMY WITH SENTINEL LYMPH NODE BIOPSY;  Surgeon: Hubbard Robinson, MD;  Location: [facility];  Service: General;  Laterality: Bilateral;  . PORT-A-CATH REMOVAL Left 10/11/2016   Procedure: REMOVAL PORT-A-CATH;  Surgeon: Hubbard Robinson, MD;  Location: [facility];  Service: General;  Laterality: Left;  . PORTACATH PLACEMENT Left 12/23/2015   Procedure: INSERTION PORT-A-CATH;  Surgeon: Hubbard Robinson, MD;  Location: [facility];  Service: General;  Laterality: Left;  . WRIST SURGERY      There were no vitals filed for this visit.  Subjective Assessment - 04/27/18 1651    Subjective  R shoulder is doing good. No pain currently. 2/10 R shoulder pain at most for the past 7 days. Neck feels bad currently. Its the same. Feels like a hopeless cause.  Does not feel a difference turning her neck.  Feels like she can lift her R shoulder up higher.     Pertinent History  R shoulder pain. Pt states she does not want to do anything to her neck because her chiropractor is currently working on it. Pt had bilatral mastectomy in 2016 and went through chemo and radiation. Feels like her R shoulder pain came from the radiation. Pt states having a tendency to have rounded  shoulders which could affect her shoulder.  R shoulder pain started within the last 7-8 months after radiation.  Does not currently have lymphedema.  R shoulder pain located posterior shoulder, arm, lateral shoulder, medial shoulder blade.  Mastectomy incision located inferior to her chest to her R arm pit area per pt.  The chiropractic treatment helps with her neck pain but temporary.  Used to go to her chiropractor 3x/week. Currently goes there 1x/week now.  Pt was told not to use ice or heat due to her lymphedema.     Patient Stated Goals  To be able to turn her head better, fix her hair.     Currently in Pain?  No/denies    Pain Score  -- No R shoulder pain currently.     Pain Onset  More  than a month ago         [facility] PT Assessment - 04/27/18 1658      AROM   Right Shoulder Flexion  123 Degrees    Right Shoulder ABduction  122 Degrees                           PT Education - 04/27/18 1719    Education provided  Yes    Education Details  ther-ex    [employer]) Educated  Patient    Methods  Explanation;Demonstration;Tactile cues;Verbal cues    Comprehension  Returned demonstration;Verbalized understanding       Objectives     No latex allergies   Medbridge Access Code: [email or web address]  9/12FOTO  Cervical rotation at start of session R 40 degrees L 35 degrees R shoulder AROM: 123 degrees flexion, 122 degrees abduction   Pt states that the supine R shoulder flexion exercise bothered her scar tissue area for a couple of days. Feels better now. 04/24/2018    Therapeutic exercise  R shoulder flexion and abduction AROM  Cervical R and L rotation  Seated L scapular retraction with manual resistance targeting the lower trap muscles 10x5 seconds for 3 sets  SeatedRscapular retractionwith manual resistance targeting the lower trap muscles10x5 seconds for 3sets  Seated bilateral shoulder ER with scapular retraction resisting yellow band 5x10 seconds for 2 sets  Seated R scapular depression isometrics with R forearm on arm rest to help decrease R rhomboid muscle tension 10x5 seconds   Seated chin tucks to promote upper thoracic extension and posterior tipping of R scapula during shoulder flexion 10x5 seconds    130 degrees R shoulder flexion after aforementioned exercises, cervical rotation: 40 degrees R 50 degrees L    Improved exercise technique, movement at target joints, use of target muscles after min to mod verbal, visual, tactile cues.      Manual therapy   Seated gentle STM toRupper trap musclesto decrease muscle knots  Cervical rotation: 40 degrees R, 45 degrees  L  R shoulder flexion 125 degrees    Pt demonstrates overall decreased R shoulder pain, and improved AROM for flexion and abduction since initial evaluation. Continued working on decreasing upper trap muscle tension, improving lower trap and ER muscle strength as well as upper thoracic extension with chin tucks to promote posterior scapular tipping to continue promoting ability to raise her R arm up comfortably.        PT Long Term Goals - 03/23/18 1737      PT LONG TERM GOAL #1   Title  Patient will have a decrease in R shoulder pain to 4/10 or  less at worst to promote ability to fix her hair, raise her arm, and reach behind her back.     Baseline  8/10 at most for the past 2 months (03/23/2018)    Time  6    Period  Weeks    Status  New    Target Date  05/04/18      PT LONG TERM GOAL #2   Title  Patient will improve R shoulder ER strength by at least 1/2 MMT grade to promote ability to raise her arm up with less pain.     Time  6    Period  Weeks    Status  New    Target Date  05/04/18      PT LONG TERM GOAL #3   Title  Patient will improve R shoulder flexion AROM to at least 130 degrees flexion to promote ability to raise her R arm and reach.     Baseline  114 degrees R shoulder flexion (03/23/2018)    Time  6    Period  Weeks    Status  New    Target Date  05/04/18      PT LONG TERM GOAL #4   Title  Patient will improve her shoulder FOTO score by at least 10 points as a demonstration of improved function.     Baseline  shoulder FOTO 55 (03/23/2018)    Time  6    Period  Weeks    Status  New    Target Date  05/04/18      PT LONG TERM GOAL #5   Title  Patient will improve her Quick Dash score by at least 10% as a demonstration of improved function.     Baseline  54.5% (03/23/2018)    Time  6    Period  Weeks    Status  New    Target Date  05/04/18            Plan - 04/27/18 1727    Clinical Impression Statement  Pt demonstrates overall decreased R  shoulder pain, and improved AROM for flexion and abduction since initial evaluation. Continued working on decreasing upper trap muscle tension, improving lower trap and ER muscle strength as well as upper thoracic extension with chin tucks to promote posterior scapular tipping to continue promoting ability to raise her R arm up comfortably.     Rehab Potential  Fair    Clinical Impairments Affecting Rehab Potential  chronicity of condition, pain, hx of breast CA and radiation and chemotherapy    PT Frequency  2x / week    PT Duration  6 weeks    PT Treatment/Interventions  Therapeutic activities;Therapeutic exercise;Aquatic Therapy;Electrical Stimulation;Iontophoresis 4mg /ml Dexamethasone;Neuromuscular re-education;Patient/family education;Manual techniques;Dry needling    PT Next Visit Plan  gentle scapular, and rotator cuff strengthening, manual techniques, ROM    Consulted and Agree with Plan of Care  Patient       Patient will benefit from skilled therapeutic intervention in order to improve the following deficits and impairments:  Pain, Postural dysfunction, Improper body mechanics, Decreased strength, Decreased range of motion  Visit Diagnosis: Chronic right shoulder pain  Muscle weakness (generalized)     Problem List Patient Active Problem List   Diagnosis Date Noted  . Constipation 04/07/2018  . Candida infection 05/18/2017  . Menopause 03/23/2017  . Rectocele 03/23/2017  . Cystocele, midline 03/23/2017  . Vaginal atrophy 03/23/2017  . Malignant neoplasm of right breast in female, estrogen receptor positive ([place])   .  Left breast cancer with T3 tumor, >5 cm in greatest dimension ([place])   . S/P bilateral mastectomy 10/28/2015  . Left breast lump 10/15/2015  . Fatty infiltration of liver 10/01/2015  . Gonalgia 10/01/2015  . Body tinea 10/01/2015  . Invasive lobular carcinoma of right breast, stage 2 ([place]) 10/01/2015  . Hypothyroidism 09/25/2015  . Morbid obesity ([place])  09/25/2015  . Hypercholesterolemia 09/25/2015  . Hypertension 09/25/2015  . Allergic rhinitis 09/25/2015  . GERD (gastroesophageal reflux disease) 09/25/2015  . Alopecia 09/25/2015  . Lump or mass in breast 09/25/2015  . History of peptic ulcer disease 09/25/2015  . Eczema 09/25/2015    Joneen Boers PT, DPT   04/27/2018, 6:40 PM  [facility] [address], [place], [address] Phone: [phone]   Fax:  [phone]  Name: LUTHER NEWHOUSE MRN: [id number] Date of Birth: August 30, 1951

## 2018-04-28 ENCOUNTER — Telehealth: Payer: Self-pay | Admitting: Family Medicine

## 2018-04-28 NOTE — Telephone Encounter (Signed)
Please advise? The PT on Kaylee Reyes is  outpatient.

## 2018-04-28 NOTE — Telephone Encounter (Signed)
Patient needs Dr. Caryn Section to give her an order for the Physical Therapist to work on her neck also. She sees the PT on  [facility] But does not know the name of the place.   She will come by and pick up the order.  Would like to get this today. Call patient when done.

## 2018-04-28 NOTE — Telephone Encounter (Signed)
Please advise 

## 2018-04-28 NOTE — Telephone Encounter (Signed)
Pt called back to ask if referral and order has been done.Kaylee Reyes  teri

## 2018-05-02 ENCOUNTER — Ambulatory Visit: Payer: Medicare Other

## 2018-05-02 DIAGNOSIS — M542 Cervicalgia: Secondary | ICD-10-CM | POA: Diagnosis not present

## 2018-05-02 DIAGNOSIS — G8929 Other chronic pain: Secondary | ICD-10-CM

## 2018-05-02 DIAGNOSIS — M25511 Pain in right shoulder: Principal | ICD-10-CM

## 2018-05-02 DIAGNOSIS — M6281 Muscle weakness (generalized): Secondary | ICD-10-CM | POA: Diagnosis not present

## 2018-05-02 NOTE — Therapy (Signed)
[facility] [address], [place], [address] Phone: [phone]   Fax:  [phone]  Physical Therapy Treatment  Patient Details  Name: Kaylee Reyes MRN: [id number] Date of Birth: 1951/09/29 Referring Provider: Lelon Huh, MD   Encounter Date: 05/02/2018  PT End of Session - 05/02/18 1031    Visit Number  10    Number of Visits  13    Date for PT Re-Evaluation  05/04/18    PT Start Time  1031    PT Stop Time  1118    PT Time Calculation (min)  47 min    Activity Tolerance  Patient tolerated treatment well    Behavior During Therapy  [facility] for tasks assessed/performed       Past Medical History:  Diagnosis Date  . Anemia   . Cancer ([facility]) 09/2015   breast cancer  . GERD (gastroesophageal reflux disease)   . History of chicken pox   . Hyperlipidemia   . Hypertension   . Hypothyroidism   . Neuropathy of foot    bilateral  . PONV (postoperative nausea and vomiting)    exploratory surgery at [facility]  . Thyroid disease   . Tuberculosis exposure    Mother had TB.  Pt shows (+) on tests, but has never had.  . Vertigo    last episode over 1 yr ago  . Wears dentures    partial upper and lower    Past Surgical History:  Procedure Laterality Date  . ABDOMINAL HYSTERECTOMY  07/13/2013   Total. with BSO for postmenopausal bleeding and fibroids with large cervical polyp. Dr. Levy Sjogren and Dr. Glennon Mac at [facility]  . ABDOMINAL ULTRASOUND  04/17/2011   Hepatomegaly with borderline slenomegaly. Suggestive of fatty liver. s/p cholecystectomy. Portions of aorta obscured  . BREAST SURGERY Right 2011   BREAST BIOPSY  . CESAREAN SECTION  1988  . CHOLECYSTECTOMY  1990  . COLONOSCOPY WITH PROPOFOL N/A 09/01/2017   Procedure: COLONOSCOPY WITH PROPOFOL;  Surgeon: [name], MD;  Location: [facility];  Service: Gastroenterology;  Laterality: N/A;  . EXPLORATORY LAPAROTOMY     Left breast  . MASTECTOMY W/  SENTINEL NODE BIOPSY Bilateral 10/28/2015   Procedure: MASTECTOMY WITH SENTINEL LYMPH NODE BIOPSY;  Surgeon: Hubbard Robinson, MD;  Location: [facility];  Service: General;  Laterality: Bilateral;  . PORT-A-CATH REMOVAL Left 10/11/2016   Procedure: REMOVAL PORT-A-CATH;  Surgeon: Hubbard Robinson, MD;  Location: [facility];  Service: General;  Laterality: Left;  . PORTACATH PLACEMENT Left 12/23/2015   Procedure: INSERTION PORT-A-CATH;  Surgeon: Hubbard Robinson, MD;  Location: [facility];  Service: General;  Laterality: Left;  . WRIST SURGERY      There were no vitals filed for this visit.  Subjective Assessment - 05/02/18 1032    Subjective  R shoulder is a little sore when she protracts her R shoulder. Otherwise feels stiff. No pain.  The scar tissue on her R side bothered her until the evening after last session. Was fine the next day.     Pertinent History  R shoulder pain. Pt states she does not want to do anything to her neck because her chiropractor is currently working on it. Pt had bilatral mastectomy in 2016 and went through chemo and radiation. Feels like her R shoulder pain came from the radiation. Pt states having a tendency to have rounded shoulders which could affect her shoulder.  R shoulder pain started within the  last 7-8 months after radiation.  Does not currently have lymphedema.  R shoulder pain located posterior shoulder, arm, lateral shoulder, medial shoulder blade.  Mastectomy incision located inferior to her chest to her R arm pit area per pt.  The chiropractic treatment helps with her neck pain but temporary.  Used to go to her chiropractor 3x/week. Currently goes there 1x/week now.  Pt was told not to use ice or heat due to her lymphedema.     Patient Stated Goals  To be able to turn her head better, fix her hair.     Currently in Pain?  No/denies    Pain Score  0-No pain    Pain Onset  More than a month ago                               PT  Education - 05/02/18 1102    Education provided  Yes    Education Details  ther-ex    [employer]) Educated  Patient    Methods  Explanation;Tactile cues;Demonstration;Verbal cues    Comprehension  Returned demonstration;Verbalized understanding         Objectives     No latex allergies  Medbridge Access Code: [email or web address]  10/12FOTO  Cervical rotation at start of session R 25degrees L 30degrees R shoulder AROM: 130 degrees flexion, 124 degrees abduction   Pt states that the supine R shoulder flexion exercise bothered her scar tissue area for a couple of days. Feels better now.04/24/2018    Manual therapy   Seated gentle STM toRupper trap muscle areato decrease muscle knots  Seated gentle STM to L upper trap muscle area to decrease tension  R shoulder flexion 130 degrees. Cervical rotation: 42 degrees R, 50 degrees L     Therapeutic exercise  R shoulder flexion and abduction AROM  Cervical R and L rotation  Seated chin tucks to promote upper thoracic extension and posterior tipping of R scapula during shoulder flexion 10x5 seconds for 2 sets  Seated L scapular retraction with manual resistance targeting the lower trap muscles 10x5 seconds for 3 sets  SeatedRscapular retractionwith manual resistance targeting the lower trap muscles10x5 seconds for 3sets  Seated thoracic extension over chair 10x5 seconds for 2 sets  Standing R shoulder ER resisting yellow band 5x10 seconds   Improved exercise technique, movement at target joints, use of target muscles after min to mod verbal, visual, tactile cues.   Improved starting R shoulder flexion AROM compared to previous session. Improved ability to turn her head as well following manual therapy to decrease upper trap tension. Continued worling on chin tucks/upper thoracic extension to promote posterior tipping of R shoulder blade when raising her R arm up as well as ER  muscle strengthening to promote glenohumberal control. 135 degrees R shoulder flexion AROM after session.                PT Long Term Goals - 03/23/18 1737      PT LONG TERM GOAL #1   Title  Patient will have a decrease in R shoulder pain to 4/10 or less at worst to promote ability to fix her hair, raise her arm, and reach behind her back.     Baseline  8/10 at most for the past 2 months (03/23/2018)    Time  6    Period  Weeks    Status  New    Target Date  05/04/18  PT LONG TERM GOAL #2   Title  Patient will improve R shoulder ER strength by at least 1/2 MMT grade to promote ability to raise her arm up with less pain.     Time  6    Period  Weeks    Status  New    Target Date  05/04/18      PT LONG TERM GOAL #3   Title  Patient will improve R shoulder flexion AROM to at least 130 degrees flexion to promote ability to raise her R arm and reach.     Baseline  114 degrees R shoulder flexion (03/23/2018)    Time  6    Period  Weeks    Status  New    Target Date  05/04/18      PT LONG TERM GOAL #4   Title  Patient will improve her shoulder FOTO score by at least 10 points as a demonstration of improved function.     Baseline  shoulder FOTO 55 (03/23/2018)    Time  6    Period  Weeks    Status  New    Target Date  05/04/18      PT LONG TERM GOAL #5   Title  Patient will improve her Quick Dash score by at least 10% as a demonstration of improved function.     Baseline  54.5% (03/23/2018)    Time  6    Period  Weeks    Status  New    Target Date  05/04/18            Plan - 05/02/18 1030    Clinical Impression Statement  Improved starting R shoulder flexion AROM compared to previous session. Improved ability to turn her head as well following manual therapy to decrease upper trap tension. Continued worling on chin tucks/upper thoracic extension to promote posterior tipping of R shoulder blade when raising her R arm up as well as ER muscle strengthening to  promote glenohumberal control. 135 degrees R shoulder flexion AROM after session.       Rehab Potential  Fair    Clinical Impairments Affecting Rehab Potential  chronicity of condition, pain, hx of breast CA and radiation and chemotherapy    PT Frequency  2x / week    PT Duration  6 weeks    PT Treatment/Interventions  Therapeutic activities;Therapeutic exercise;Aquatic Therapy;Electrical Stimulation;Iontophoresis 4mg /ml Dexamethasone;Neuromuscular re-education;Patient/family education;Manual techniques;Dry needling    PT Next Visit Plan  gentle scapular, and rotator cuff strengthening, manual techniques, ROM    Consulted and Agree with Plan of Care  Patient       Patient will benefit from skilled therapeutic intervention in order to improve the following deficits and impairments:  Pain, Postural dysfunction, Improper body mechanics, Decreased strength, Decreased range of motion  Visit Diagnosis: Chronic right shoulder pain  Muscle weakness (generalized)     Problem List Patient Active Problem List   Diagnosis Date Noted  . Constipation 04/07/2018  . Candida infection 05/18/2017  . Menopause 03/23/2017  . Rectocele 03/23/2017  . Cystocele, midline 03/23/2017  . Vaginal atrophy 03/23/2017  . Malignant neoplasm of right breast in female, estrogen receptor positive ([place])   . Left breast cancer with T3 tumor, >5 cm in greatest dimension ([place])   . S/P bilateral mastectomy 10/28/2015  . Left breast lump 10/15/2015  . Fatty infiltration of liver 10/01/2015  . Gonalgia 10/01/2015  . Body tinea 10/01/2015  . Invasive lobular carcinoma of right breast, stage  2 ([place]) 10/01/2015  . Hypothyroidism 09/25/2015  . Morbid obesity ([place]) 09/25/2015  . Hypercholesterolemia 09/25/2015  . Hypertension 09/25/2015  . Allergic rhinitis 09/25/2015  . GERD (gastroesophageal reflux disease) 09/25/2015  . Alopecia 09/25/2015  . Lump or mass in breast 09/25/2015  . History of peptic ulcer disease  09/25/2015  . Eczema 09/25/2015    Joneen Boers PT, DPT   05/02/2018, 12:19 PM  [facility] [address] [place], [address] Phone: [phone]   Fax:  [phone]  Name: Kaylee Reyes MRN: [id number] Date of Birth: 10/13/1951

## 2018-05-03 ENCOUNTER — Telehealth: Payer: Self-pay | Admitting: Family Medicine

## 2018-05-03 NOTE — Telephone Encounter (Signed)
Patient advised referral order is ready to pick-up.

## 2018-05-03 NOTE — Telephone Encounter (Signed)
Pt is requesting another referral be sent to [facility] because they have been working with pt for her shoulder and thinks that her issue with her shoulder could be coming from issues with her neck. Pt is requesting for a referral to be sent so they can also work on pt's neck. Please advise. Thanks TNP

## 2018-05-04 ENCOUNTER — Ambulatory Visit: Payer: Medicare Other

## 2018-05-04 DIAGNOSIS — M542 Cervicalgia: Secondary | ICD-10-CM | POA: Diagnosis not present

## 2018-05-04 DIAGNOSIS — M6281 Muscle weakness (generalized): Secondary | ICD-10-CM | POA: Diagnosis not present

## 2018-05-04 DIAGNOSIS — G8929 Other chronic pain: Secondary | ICD-10-CM | POA: Diagnosis not present

## 2018-05-04 DIAGNOSIS — M25511 Pain in right shoulder: Secondary | ICD-10-CM | POA: Diagnosis not present

## 2018-05-04 NOTE — Patient Instructions (Signed)
  Resting your head on a deflated ball   Press your tongue at the roof of your mouth:    Nod your head for 1 min comfortably for 3 sets daily       Rotate your head for 1 min comfortably for 3 sets daily     (Diaphragmatic breathing)  Place a book on top of your abdomen    Inhale by expanding your tummy

## 2018-05-04 NOTE — Therapy (Signed)
[facility] [address] [place], [address] Phone: [phone]   Fax:  [phone]  Physical Therapy Neck Evaluation And R Shoulder Progress Report   Patient Details  Name: Kaylee Reyes MRN: [id number] Date of Birth: 1951-11-22 Referring Provider: Lelon Huh, MD   Encounter Date: 05/04/2018  PT End of Session - 05/04/18 1520    Visit Number  11    Number of Visits  25    Date for PT Re-Evaluation  06/15/18    PT Start Time  3762    PT Stop Time  1627    PT Time Calculation (min)  66 min    Activity Tolerance  Patient tolerated treatment well    Behavior During Therapy  [facility] for tasks assessed/performed       Past Medical History:  Diagnosis Date  . Anemia   . Cancer ([place]) 09/2015   breast cancer  . GERD (gastroesophageal reflux disease)   . History of chicken pox   . Hyperlipidemia   . Hypertension   . Hypothyroidism   . Neuropathy of foot    bilateral  . PONV (postoperative nausea and vomiting)    exploratory surgery at [facility]  . Thyroid disease   . Tuberculosis exposure    Mother had TB.  Pt shows (+) on tests, but has never had.  . Vertigo    last episode over 1 yr ago  . Wears dentures    partial upper and lower    Past Surgical History:  Procedure Laterality Date  . ABDOMINAL HYSTERECTOMY  07/13/2013   Total. with BSO for postmenopausal bleeding and fibroids with large cervical polyp. Dr. Levy Sjogren and Dr. Glennon Mac at [facility]  . ABDOMINAL ULTRASOUND  04/17/2011   Hepatomegaly with borderline slenomegaly. Suggestive of fatty liver. s/p cholecystectomy. Portions of aorta obscured  . BREAST SURGERY Right 2011   BREAST BIOPSY  . CESAREAN SECTION  1988  . CHOLECYSTECTOMY  1990  . COLONOSCOPY WITH PROPOFOL N/A 09/01/2017   Procedure: COLONOSCOPY WITH PROPOFOL;  Surgeon: [name], MD;  Location: [facility];  Service: Gastroenterology;  Laterality: N/A;  . EXPLORATORY  LAPAROTOMY     Left breast  . MASTECTOMY W/ SENTINEL NODE BIOPSY Bilateral 10/28/2015   Procedure: MASTECTOMY WITH SENTINEL LYMPH NODE BIOPSY;  Surgeon: Hubbard Robinson, MD;  Location: [facility];  Service: General;  Laterality: Bilateral;  . PORT-A-CATH REMOVAL Left 10/11/2016   Procedure: REMOVAL PORT-A-CATH;  Surgeon: Hubbard Robinson, MD;  Location: [facility];  Service: General;  Laterality: Left;  . PORTACATH PLACEMENT Left 12/23/2015   Procedure: INSERTION PORT-A-CATH;  Surgeon: Hubbard Robinson, MD;  Location: [facility];  Service: General;  Laterality: Left;  . WRIST SURGERY      There were no vitals filed for this visit.   Subjective Assessment - 05/04/18 1522    Subjective  No neck pain currently. Just stiffness. 5/10 at most for the past 2 months (when turning her head); No R shoulder pain currently (3/10 at most for the past 7 days).  Pt states her R arm is better and can reach higher.     Pertinent History  Neck pain began since the chemo and radiation. Might have also changed her posture after her breast surgeries 2 years ago. Neck pain started bothering her mid 2018.  Currently having a hard time turning her head both ways.  Neck pain feels better with treatment to decrease tension. The relief is temporary.  Currently using a pillow that is not as thick which is helping.     Patient Stated Goals  To be able to turn her head further, without pain, fix her hair.     Currently in Pain?  No/denies    Pain Score  0-No pain    Pain Location  -- R shoulder, neck    Pain Descriptors / Indicators  -- stiff, strain    Pain Type  Chronic pain    Pain Onset  More than a month ago    Pain Frequency  Constant    Aggravating Factors   turning her head to the R or L; moving her R arm around. Sometimes R arm pops.     Pain Relieving Factors  rest         [facility] PT Assessment - 05/04/18 1531      Observation/Other Assessments   Focus on Therapeutic Outcomes (FOTO)   53 pt might have  answered previous questions based on her neck.    Quick DASH   27.3% measured on 04/19/2018      AROM   Right Shoulder Flexion  130 Degrees    Cervical Flexion  29    Cervical Extension  38 upper cervical stiffness    Cervical - Right Side Bend  17    Cervical - Left Side Bend  14    Cervical - Right Rotation  40 with stiffness; 40 deg in supine    Cervical - Left Rotation  40 with stiffness; 40 degrees in supine      Strength   Right Shoulder External Rotation  4+/5      Palpation   Palpation comment  cervical paraspinal, bilateral upper trap muscle tension.                 Objective measurements completed on examination: See above findings.     Manual therapy:   supine STM to posterior cervical paraspinal muscles   Supine suboccipital release   50 degrees R and L cervical rotation   Therapeutic exercise  Supine with head on deflated ball and with tongue pressed to roof of mouth:   Cervical nodding 1 min x 3  Cervical rotation 1 min x  3     Supine cervical rotation 55 degrees R, 53 degrees L   Chin tucks 10x5 seconds for 3 sets    Diaphragmatic breathing, 2 lbs weight on abdomen to help decrease scalene muscle use for 1 min x 3  Manually resisted R shoulder ER  Reviewed HEP.    Improved exercise technique, movement at target joints, use of target muscles after min to mod verbal, visual, tactile cues.       Patient is a 67 year old female who came to physical therapy secondary to neck stiffness. She also presents with muscle tension to posterior cervical paraspinals, as well as to her bilateral upper traps, limited cervical AROM, and difficulty performing tasks such as turning her head to look around and drive. Pt also demonstrates decreased R shoulder pain, improved shoulder flexion AROM, ER strength, and ability to raise her arm since initial evaluation. Pt still demonstrates some pain in her R shoulder, and difficulty performing functional tasks with  her arm (but better). Patient would benefit from continued skilled physical therapy services to address the aforementioned deficits.        PT Education - 05/04/18 1559    Education provided  Yes    Education Details  ther-ex, plan of care  Person(s) Educated  Patient    Methods  Explanation;Demonstration;Tactile cues;Verbal cues    Comprehension  Returned demonstration;Verbalized understanding          PT Long Term Goals - 05/04/18 1601      PT LONG TERM GOAL #1   Title  Patient will have a decrease in R shoulder pain to 4/10 or less at worst to promote ability to fix her hair, raise her arm, and reach behind her back.     Baseline  8/10 at most for the past 2 months (03/23/2018); 3/10 at most for the past 7 days (05/04/2018)    Time  6    Period  Weeks    Status  Achieved      PT LONG TERM GOAL #2   Title  Patient will improve R shoulder ER strength by at least 1/2 MMT grade to promote ability to raise her arm up with less pain.     Time  6    Period  Weeks    Status  Achieved      PT LONG TERM GOAL #3   Title  Patient will improve R shoulder flexion AROM to at least 130 degrees flexion consistently to promote ability to raise her R arm and reach.     Baseline  114 degrees R shoulder flexion (03/23/2018); 130 degrees flexion (05/04/2018)    Time  6    Period  Weeks    Status  Revised    Target Date  06/15/18      PT LONG TERM GOAL #4   Title  Patient will improve her shoulder FOTO score by at least 10 points as a demonstration of improved function.     Baseline  shoulder FOTO 55 (03/23/2018); 53 (pt might have answered initial survey based on her neck) 04/19/2018    Time  6    Period  Weeks    Status  On-going    Target Date  06/15/18      PT LONG TERM GOAL #5   Title  Patient will improve her Quick Dash score by at least 10% as a demonstration of improved function.     Baseline  54.5% (03/23/2018); 27.3% (04/19/2018)    Time  6    Period  Weeks    Status  Achieved       Additional Long Term Goals   Additional Long Term Goals  Yes      PT LONG TERM GOAL #6   Title  Patient will improved cervical AROM to at least 50 degrees R and L consistently to promote ability to look around as well as drive.     Baseline  Cervical AROM: 40 degrees R and L rotation (05/04/2018)    Time  6    Period  Weeks    Status  New    Target Date  06/15/18      PT LONG TERM GOAL #7   Title  Patient will have a decrease neck pain to 2/10 or less at worst to promote ability to look around, especially when driving.     Baseline  5/10 neck pain at most for the past 2 months (05/04/2018)    Time  6    Period  Weeks    Status  New    Target Date  06/15/18             Plan - 05/04/18 1600    Clinical Impression Statement  Patient is a 67 year old female  who came to physical therapy secondary to neck stiffness. She also presents with muscle tension to posterior cervical paraspinals, as well as to her bilateral upper traps, limited cervical AROM, and difficulty performing tasks such as turning her head to look around and drive. Pt also demonstrates decreased R shoulder pain, improved shoulder flexion AROM, ER strength, and ability to raise her arm since initial evaluation. Pt still demonstrates some pain in her R shoulder, and difficulty performing functional tasks with her arm (but better). Patient would benefit from continued skilled physical therapy services to address the aforementioned deficits.     History and Personal Factors relevant to plan of care:  Chronicity of condition, hx of breast cancer, chemotherapy, radiation    Clinical Presentation  Stable    Clinical Presentation due to:  neck pain has not improved, but shoulder has improved    Clinical Decision Making  Low    Rehab Potential  Fair    Clinical Impairments Affecting Rehab Potential  chronicity of condition, pain, hx of breast CA and radiation and chemotherapy    PT Frequency  2x / week    PT Duration  6  weeks    PT Treatment/Interventions  Therapeutic activities;Therapeutic exercise;Aquatic Therapy;Electrical Stimulation;Iontophoresis 4mg /ml Dexamethasone;Neuromuscular re-education;Patient/family education;Manual techniques;Dry needling    PT Next Visit Plan  cervical muscle strengthening, gentle scapular, and rotator cuff strengthening, manual techniques, ROM    Consulted and Agree with Plan of Care  Patient       Patient will benefit from skilled therapeutic intervention in order to improve the following deficits and impairments:  Pain, Postural dysfunction, Improper body mechanics, Decreased strength, Decreased range of motion  Visit Diagnosis: Cervicalgia - Plan: PT plan of care cert/re-cert  Chronic right shoulder pain - Plan: PT plan of care cert/re-cert  Muscle weakness (generalized) - Plan: PT plan of care cert/re-cert     Problem List Patient Active Problem List   Diagnosis Date Noted  . Constipation 04/07/2018  . Candida infection 05/18/2017  . Menopause 03/23/2017  . Rectocele 03/23/2017  . Cystocele, midline 03/23/2017  . Vaginal atrophy 03/23/2017  . Malignant neoplasm of right breast in female, estrogen receptor positive ([place])   . Left breast cancer with T3 tumor, >5 cm in greatest dimension ([place])   . S/P bilateral mastectomy 10/28/2015  . Left breast lump 10/15/2015  . Fatty infiltration of liver 10/01/2015  . Gonalgia 10/01/2015  . Body tinea 10/01/2015  . Invasive lobular carcinoma of right breast, stage 2 ([place]) 10/01/2015  . Hypothyroidism 09/25/2015  . Morbid obesity ([place]) 09/25/2015  . Hypercholesterolemia 09/25/2015  . Hypertension 09/25/2015  . Allergic rhinitis 09/25/2015  . GERD (gastroesophageal reflux disease) 09/25/2015  . Alopecia 09/25/2015  . Lump or mass in breast 09/25/2015  . History of peptic ulcer disease 09/25/2015  . Eczema 09/25/2015    Thank you for your referral.  Joneen Boers PT, DPT    05/04/2018, 6:51 PM  [facility] [address] [place], [address] Phone: [phone]   Fax:  [phone]  Name: RASHADA KLONTZ MRN: [id number] Date of Birth: July 10, 1951

## 2018-05-08 ENCOUNTER — Ambulatory Visit: Payer: Medicare Other | Attending: Family Medicine

## 2018-05-08 DIAGNOSIS — M25511 Pain in right shoulder: Secondary | ICD-10-CM | POA: Diagnosis not present

## 2018-05-08 DIAGNOSIS — G8929 Other chronic pain: Secondary | ICD-10-CM | POA: Insufficient documentation

## 2018-05-08 DIAGNOSIS — M6281 Muscle weakness (generalized): Secondary | ICD-10-CM | POA: Diagnosis not present

## 2018-05-08 DIAGNOSIS — M542 Cervicalgia: Secondary | ICD-10-CM

## 2018-05-08 NOTE — Therapy (Signed)
[facility] [address], [place], [address] Phone: [phone]   Fax:  [phone]  Physical Therapy Treatment  Patient Details  Name: Kaylee Reyes MRN: [id number] Date of Birth: September 05, 1951 Referring Provider: Lelon Huh, MD   Encounter Date: 05/08/2018  PT End of Session - 05/08/18 1354    Visit Number  12    Number of Visits  25    Date for PT Re-Evaluation  06/15/18    PT Start Time  6962 pt arrived late    PT Stop Time  1449    PT Time Calculation (min)  54 min    Activity Tolerance  Patient tolerated treatment well    Behavior During Therapy  [facility] for tasks assessed/performed       Past Medical History:  Diagnosis Date  . Anemia   . Cancer ([place]) 09/2015   breast cancer  . GERD (gastroesophageal reflux disease)   . History of chicken pox   . Hyperlipidemia   . Hypertension   . Hypothyroidism   . Neuropathy of foot    bilateral  . PONV (postoperative nausea and vomiting)    exploratory surgery at [facility]  . Thyroid disease   . Tuberculosis exposure    Mother had TB.  Pt shows (+) on tests, but has never had.  . Vertigo    last episode over 1 yr ago  . Wears dentures    partial upper and lower    Past Surgical History:  Procedure Laterality Date  . ABDOMINAL HYSTERECTOMY  07/13/2013   Total. with BSO for postmenopausal bleeding and fibroids with large cervical polyp. Dr. Levy Sjogren and Dr. Glennon Mac at [facility]  . ABDOMINAL ULTRASOUND  04/17/2011   Hepatomegaly with borderline slenomegaly. Suggestive of fatty liver. s/p cholecystectomy. Portions of aorta obscured  . BREAST SURGERY Right 2011   BREAST BIOPSY  . CESAREAN SECTION  1988  . CHOLECYSTECTOMY  1990  . COLONOSCOPY WITH PROPOFOL N/A 09/01/2017   Procedure: COLONOSCOPY WITH PROPOFOL;  Surgeon: [name], MD;  Location: [facility];  Service: Gastroenterology;  Laterality: N/A;  . EXPLORATORY LAPAROTOMY     Left breast  .  MASTECTOMY W/ SENTINEL NODE BIOPSY Bilateral 10/28/2015   Procedure: MASTECTOMY WITH SENTINEL LYMPH NODE BIOPSY;  Surgeon: Hubbard Robinson, MD;  Location: [facility];  Service: General;  Laterality: Bilateral;  . PORT-A-CATH REMOVAL Left 10/11/2016   Procedure: REMOVAL PORT-A-CATH;  Surgeon: Hubbard Robinson, MD;  Location: [facility];  Service: General;  Laterality: Left;  . PORTACATH PLACEMENT Left 12/23/2015   Procedure: INSERTION PORT-A-CATH;  Surgeon: Hubbard Robinson, MD;  Location: [facility];  Service: General;  Laterality: Left;  . WRIST SURGERY      There were no vitals filed for this visit.  Subjective Assessment - 05/08/18 1357    Subjective  Neck is kind of rough. Did not do any exercises over the weekend. Also doing her kitchen counters and feels tension in her shoulders. Sanded her counters down and coated it with polyacrylic.   Pt states that she usually feels a lot of tension R upper trap.     Pertinent History  Neck pain began since the chemo and radiation. Might have also changed her posture after her breast surgeries 2 years ago. Neck pain started bothering her mid 2018.  Currently having a hard time turning her head both ways.  Neck pain feels better with treatment to decrease tension. The relief is temporary.  Currently using a pillow that is not as thick which is helping.     Patient Stated Goals  To be able to turn her head further, without pain, fix her hair.     Currently in Pain?  No/denies    Pain Score  0-No pain just stiff    Pain Onset  More than a month ago                               PT Education - 05/08/18 1407    Education provided  Yes    Education Details  ther-ex    [employer]) Educated  Patient    Methods  Explanation;Demonstration;Tactile cues;Verbal cues    Comprehension  Returned demonstration;Verbalized understanding      Objectives  35 degrees cervical rotation R and L at start of session   Therapeutic  exercise  Supine with head on deflated ball and with tongue pressed to roof of mouth:              Cervical nodding 1 min x 3             Cervical rotation 1 min x  3               Chin tucks 10x5 seconds for 3 sets   Supine deep cervical flexion 5x2, then 2x. Posterior neck discomfort which eases with rest. Felt like a strain.   Seated manually resisted cervical flexion, thumbs at chin 10x5 seconds for 2 sets. Bilateral upper trap tension felt after 2nd set  Seated cervical flexion stretch 10x5 seconds   Difficulty with cervical rotation afterwards. Slight catching R posterior neck.   Seated chin tucks 10x5 seconds. Slight decreased stiffness with cervical rotation observed.   Improved exercise technique, movement at target joints, use of target muscles after min to mod verbal, visual, tactile cues.      Manual therapy:              supine STM to posterior cervical paraspinal muscles               Supine suboccipital release               45 degrees R and L cervical rotation afterwards   Seated STM to R rhomboid muscle. Decreased catching  Decreased stiffness after soft tissue techniques to decrease tension. Increase stiffness with manually resisted cervical flexion exercise as well as with cervical flexion stretch. Decreased stiffness with STM to decrease R rhomboid muscle tension to decrease tension to C7 area.  Contralateral scapular protraction and shrug observed with cervical rotation. Possible upper trap tightness.     PT Long Term Goals - 05/04/18 1601      PT LONG TERM GOAL #1   Title  Patient will have a decrease in R shoulder pain to 4/10 or less at worst to promote ability to fix her hair, raise her arm, and reach behind her back.     Baseline  8/10 at most for the past 2 months (03/23/2018); 3/10 at most for the past 7 days (05/04/2018)    Time  6    Period  Weeks    Status  Achieved      PT LONG TERM GOAL #2   Title  Patient will improve R shoulder ER  strength by at least 1/2 MMT grade to promote ability to raise her arm up with less pain.  Time  6    Period  Weeks    Status  Achieved      PT LONG TERM GOAL #3   Title  Patient will improve R shoulder flexion AROM to at least 130 degrees flexion consistently to promote ability to raise her R arm and reach.     Baseline  114 degrees R shoulder flexion (03/23/2018); 130 degrees flexion (05/04/2018)    Time  6    Period  Weeks    Status  Revised    Target Date  06/15/18      PT LONG TERM GOAL #4   Title  Patient will improve her shoulder FOTO score by at least 10 points as a demonstration of improved function.     Baseline  shoulder FOTO 55 (03/23/2018); 53 (pt might have answered initial survey based on her neck) 04/19/2018    Time  6    Period  Weeks    Status  On-going    Target Date  06/15/18      PT LONG TERM GOAL #5   Title  Patient will improve her Quick Dash score by at least 10% as a demonstration of improved function.     Baseline  54.5% (03/23/2018); 27.3% (04/19/2018)    Time  6    Period  Weeks    Status  Achieved      Additional Long Term Goals   Additional Long Term Goals  Yes      PT LONG TERM GOAL #6   Title  Patient will improved cervical AROM to at least 50 degrees R and L consistently to promote ability to look around as well as drive.     Baseline  Cervical AROM: 40 degrees R and L rotation (05/04/2018)    Time  6    Period  Weeks    Status  New    Target Date  06/15/18      PT LONG TERM GOAL #7   Title  Patient will have a decrease neck pain to 2/10 or less at worst to promote ability to look around, especially when driving.     Baseline  5/10 neck pain at most for the past 2 months (05/04/2018)    Time  6    Period  Weeks    Status  New    Target Date  06/15/18            Plan - 05/08/18 1408    Clinical Impression Statement  Decreased stiffness after soft tissue techniques to decrease tension. Increase stiffness with manually resisted cervical  flexion exercise as well as with cervical flexion stretch. Decreased stiffness with STM to decrease R rhomboid muscle tension to decrease tension to C7 area.  Contralateral scapular protraction and shrug observed with cervical rotation. Possible upper trap tightness.     Rehab Potential  Fair    Clinical Impairments Affecting Rehab Potential  chronicity of condition, pain, hx of breast CA and radiation and chemotherapy    PT Frequency  2x / week    PT Duration  6 weeks    PT Treatment/Interventions  Therapeutic activities;Therapeutic exercise;Aquatic Therapy;Electrical Stimulation;Iontophoresis 4mg /ml Dexamethasone;Neuromuscular re-education;Patient/family education;Manual techniques;Dry needling    PT Next Visit Plan  cervical muscle strengthening, gentle scapular, and rotator cuff strengthening, manual techniques, ROM    Consulted and Agree with Plan of Care  Patient       Patient will benefit from skilled therapeutic intervention in order to improve the following deficits and impairments:  Pain, Postural dysfunction, Improper body mechanics, Decreased strength, Decreased range of motion  Visit Diagnosis: Cervicalgia  Chronic right shoulder pain  Muscle weakness (generalized)     Problem List Patient Active Problem List   Diagnosis Date Noted  . Constipation 04/07/2018  . Candida infection 05/18/2017  . Menopause 03/23/2017  . Rectocele 03/23/2017  . Cystocele, midline 03/23/2017  . Vaginal atrophy 03/23/2017  . Malignant neoplasm of right breast in female, estrogen receptor positive ([place])   . Left breast cancer with T3 tumor, >5 cm in greatest dimension ([place])   . S/P bilateral mastectomy 10/28/2015  . Left breast lump 10/15/2015  . Fatty infiltration of liver 10/01/2015  . Gonalgia 10/01/2015  . Body tinea 10/01/2015  . Invasive lobular carcinoma of right breast, stage 2 ([place]) 10/01/2015  . Hypothyroidism 09/25/2015  . Morbid obesity ([place]) 09/25/2015  .  Hypercholesterolemia 09/25/2015  . Hypertension 09/25/2015  . Allergic rhinitis 09/25/2015  . GERD (gastroesophageal reflux disease) 09/25/2015  . Alopecia 09/25/2015  . Lump or mass in breast 09/25/2015  . History of peptic ulcer disease 09/25/2015  . Eczema 09/25/2015    Kaylee Reyes PT, DPT   05/08/2018, 2:56 PM  [facility] [address], [place], [address] Phone: [phone]   Fax:  [phone]  Name: Kaylee Reyes MRN: [id number] Date of Birth: Apr 17, 1951

## 2018-05-11 ENCOUNTER — Ambulatory Visit: Payer: Medicare Other

## 2018-05-11 DIAGNOSIS — M6281 Muscle weakness (generalized): Secondary | ICD-10-CM | POA: Diagnosis not present

## 2018-05-11 DIAGNOSIS — M542 Cervicalgia: Secondary | ICD-10-CM | POA: Diagnosis not present

## 2018-05-11 DIAGNOSIS — G8929 Other chronic pain: Secondary | ICD-10-CM

## 2018-05-11 DIAGNOSIS — M25511 Pain in right shoulder: Secondary | ICD-10-CM | POA: Diagnosis not present

## 2018-05-11 NOTE — Therapy (Signed)
[facility] [address] [place], [address] Phone: [phone]   Fax:  [phone]  Physical Therapy Treatment  Patient Details  Name: Kaylee Reyes MRN: [id number] Date of Birth: [birth year] Referring Provider: Lelon Huh, MD   Encounter Date: 05/11/2018  PT End of Session - 05/11/18 0949    Visit Number  13    Number of Visits  25    Date for PT Re-Evaluation  06/15/18    PT Start Time  0949    PT Stop Time  1036    PT Time Calculation (min)  47 min    Activity Tolerance  Patient tolerated treatment well    Behavior During Therapy  [facility] for tasks assessed/performed       Past Medical History:  Diagnosis Date  . Anemia   . Cancer ([place]) 09/2015   breast cancer  . GERD (gastroesophageal reflux disease)   . History of chicken pox   . Hyperlipidemia   . Hypertension   . Hypothyroidism   . Neuropathy of foot    bilateral  . PONV (postoperative nausea and vomiting)    exploratory surgery at [facility]  . Thyroid disease   . Tuberculosis exposure    Mother had TB.  Pt shows (+) on tests, but has never had.  . Vertigo    last episode over 1 yr ago  . Wears dentures    partial upper and lower    Past Surgical History:  Procedure Laterality Date  . ABDOMINAL HYSTERECTOMY  07/13/2013   Total. with BSO for postmenopausal bleeding and fibroids with large cervical polyp. Dr. Levy Sjogren and Dr. Glennon Mac at [facility]  . ABDOMINAL ULTRASOUND  04/17/2011   Hepatomegaly with borderline slenomegaly. Suggestive of fatty liver. s/p cholecystectomy. Portions of aorta obscured  . BREAST SURGERY Right 2011   BREAST BIOPSY  . CESAREAN SECTION  1988  . CHOLECYSTECTOMY  1990  . COLONOSCOPY WITH PROPOFOL N/A 09/01/2017   Procedure: COLONOSCOPY WITH PROPOFOL;  Surgeon: [name], MD;  Location: [facility];  Service: Gastroenterology;  Laterality: N/A;  . EXPLORATORY LAPAROTOMY     Left breast  . MASTECTOMY W/  SENTINEL NODE BIOPSY Bilateral 10/28/2015   Procedure: MASTECTOMY WITH SENTINEL LYMPH NODE BIOPSY;  Surgeon: Hubbard Robinson, MD;  Location: [facility];  Service: General;  Laterality: Bilateral;  . PORT-A-CATH REMOVAL Left 10/11/2016   Procedure: REMOVAL PORT-A-CATH;  Surgeon: Hubbard Robinson, MD;  Location: [facility];  Service: General;  Laterality: Left;  . PORTACATH PLACEMENT Left 12/23/2015   Procedure: INSERTION PORT-A-CATH;  Surgeon: Hubbard Robinson, MD;  Location: [facility];  Service: General;  Laterality: Left;  . WRIST SURGERY      There were no vitals filed for this visit.  Subjective Assessment - 05/11/18 0950    Subjective  Massaged her neck this morning. Neck and shoulder feel better today. Started feeling pain in her R (medial) shoulder blade the other day. The only thing she can attribute it to is working on her counters.     Pertinent History  Neck pain began since the chemo and radiation. Might have also changed her posture after her breast surgeries 2 years ago. Neck pain started bothering her mid 2018.  Currently having a hard time turning her head both ways.  Neck pain feels better with treatment to decrease tension. The relief is temporary.  Currently using a pillow that is not as thick which is helping.  Patient Stated Goals  To be able to turn her head further, without pain, fix her hair.     Currently in Pain?  No/denies    Pain Score  0-No pain    Pain Onset  More than a month ago                               PT Education - 05/11/18 1250    Education provided  Yes    Education Details  ther-ex, HEP    Person(s) Educated  Patient    Methods  Explanation;Demonstration;Tactile cues;Verbal cues    Comprehension  Returned demonstration;Verbalized understanding         Objectives  30 degrees cervical rotation R and 43 degrees L at start of session     Manual therapy:  Seated STM to bilateral posterior cervical paraspinal  muscles   Seated STM to L rhomboid muscles    Seated STM to R rhomboid muscles   Cervical rotation: 45 degrees R and L afterwards   Therapeutic exercise  Arm circumference 10 cm proximal to elbow prior to exercises  R: 48 cm  L: 50 cm  Same before and after exercises  Supine with head on deflated ball and with tongue pressed to roof of mouth:   Supine cervical rotation 5 seconds x 2 each side. R lateral neck pain   Standing bilateral scapular retraction yellow band 10x5 seconds for 2 sets   Decreased R radial nerve symptoms with chin tuck position  Chin tucks against the wall, with deflated ball behind head  10x5 seconds, then 5x5 seconds   Increased neck stiffness  Seated gentle manually resisted cervical flexion isometrics 10x2 with 5 seconds  Decreased neck stiffness afterwards   Then pt performed herself with thumb resistance under chin 10x5 seconds.    Reviewed and given as part of her HEP. Pt demonstrated and verbalized understanding.   Improved exercise technique, movement at target joints, use of target muscles after mod verbal, visual, tactile cues.    Decreased neck stiffness with reciprocal inhibition of posterior cervical paraspinal muscles as well as with treatment to decrease rhomboid muscle tension.        PT Long Term Goals - 05/04/18 1601      PT LONG TERM GOAL #1   Title  Patient will have a decrease in R shoulder pain to 4/10 or less at worst to promote ability to fix her hair, raise her arm, and reach behind her back.     Baseline  8/10 at most for the past 2 months (03/23/2018); 3/10 at most for the past 7 days (05/04/2018)    Time  6    Period  Weeks    Status  Achieved      PT LONG TERM GOAL #2   Title  Patient will improve R shoulder ER strength by at least 1/2 MMT grade to promote ability to raise her arm up with less pain.     Time  6    Period  Weeks    Status  Achieved      PT LONG TERM GOAL #3   Title  Patient will improve R  shoulder flexion AROM to at least 130 degrees flexion consistently to promote ability to raise her R arm and reach.     Baseline  114 degrees R shoulder flexion (03/23/2018); 130 degrees flexion (05/04/2018)    Time  6    Period  Weeks    Status  Revised    Target Date  06/15/18      PT LONG TERM GOAL #4   Title  Patient will improve her shoulder FOTO score by at least 10 points as a demonstration of improved function.     Baseline  shoulder FOTO 55 (03/23/2018); 53 (pt might have answered initial survey based on her neck) 04/19/2018    Time  6    Period  Weeks    Status  On-going    Target Date  06/15/18      PT LONG TERM GOAL #5   Title  Patient will improve her Quick Dash score by at least 10% as a demonstration of improved function.     Baseline  54.5% (03/23/2018); 27.3% (04/19/2018)    Time  6    Period  Weeks    Status  Achieved      Additional Long Term Goals   Additional Long Term Goals  Yes      PT LONG TERM GOAL #6   Title  Patient will improved cervical AROM to at least 50 degrees R and L consistently to promote ability to look around as well as drive.     Baseline  Cervical AROM: 40 degrees R and L rotation (05/04/2018)    Time  6    Period  Weeks    Status  New    Target Date  06/15/18      PT LONG TERM GOAL #7   Title  Patient will have a decrease neck pain to 2/10 or less at worst to promote ability to look around, especially when driving.     Baseline  5/10 neck pain at most for the past 2 months (05/04/2018)    Time  6    Period  Weeks    Status  New    Target Date  06/15/18            Plan - 05/11/18 0949    Clinical Impression Statement  Decreased neck stiffness with reciprocal inhibition of posterior cervical paraspinal muscles as well as with treatment to decrease rhomboid muscle tension.     Rehab Potential  Fair    Clinical Impairments Affecting Rehab Potential  chronicity of condition, pain, hx of breast CA and radiation and chemotherapy    PT  Frequency  2x / week    PT Duration  6 weeks    PT Treatment/Interventions  Therapeutic activities;Therapeutic exercise;Aquatic Therapy;Electrical Stimulation;Iontophoresis 4mg /ml Dexamethasone;Neuromuscular re-education;Patient/family education;Manual techniques;Dry needling    PT Next Visit Plan  cervical muscle strengthening, gentle scapular, and rotator cuff strengthening, manual techniques, ROM    Consulted and Agree with Plan of Care  Patient       Patient will benefit from skilled therapeutic intervention in order to improve the following deficits and impairments:  Pain, Postural dysfunction, Improper body mechanics, Decreased strength, Decreased range of motion  Visit Diagnosis: Chronic right shoulder pain  Muscle weakness (generalized)  Cervicalgia     Problem List Patient Active Problem List   Diagnosis Date Noted  . Constipation 04/07/2018  . Candida infection 05/18/2017  . Menopause 03/23/2017  . Rectocele 03/23/2017  . Cystocele, midline 03/23/2017  . Vaginal atrophy 03/23/2017  . Malignant neoplasm of right breast in female, estrogen receptor positive ([place])   . Left breast cancer with T3 tumor, >5 cm in greatest dimension ([place])   . S/P bilateral mastectomy 10/28/2015  . Left breast lump 10/15/2015  . Fatty infiltration of  liver 10/01/2015  . Gonalgia 10/01/2015  . Body tinea 10/01/2015  . Invasive lobular carcinoma of right breast, stage 2 ([place]) 10/01/2015  . Hypothyroidism 09/25/2015  . Morbid obesity ([place]) 09/25/2015  . Hypercholesterolemia 09/25/2015  . Hypertension 09/25/2015  . Allergic rhinitis 09/25/2015  . GERD (gastroesophageal reflux disease) 09/25/2015  . Alopecia 09/25/2015  . Lump or mass in breast 09/25/2015  . History of peptic ulcer disease 09/25/2015  . Eczema 09/25/2015    Joneen Boers PT, DPT   05/11/2018, 4:26 PM  [facility] [address] [place],  [address] Phone: [phone]   Fax:  [phone]  Name: FRANCIS YARDLEY MRN: [id number] Date of Birth: October 07, 1951

## 2018-05-11 NOTE — Patient Instructions (Signed)
  Gave seated self manually resisted cervical flexion with thumb under chin, gentle resistance 10x5 seconds for 3 sets daily. Pt demonstrated and verbalized understanding.

## 2018-05-15 ENCOUNTER — Ambulatory Visit: Payer: Medicare Other

## 2018-05-15 DIAGNOSIS — M25511 Pain in right shoulder: Secondary | ICD-10-CM | POA: Diagnosis not present

## 2018-05-15 DIAGNOSIS — G8929 Other chronic pain: Secondary | ICD-10-CM | POA: Diagnosis not present

## 2018-05-15 DIAGNOSIS — M6281 Muscle weakness (generalized): Secondary | ICD-10-CM

## 2018-05-15 DIAGNOSIS — M542 Cervicalgia: Secondary | ICD-10-CM

## 2018-05-15 NOTE — Therapy (Signed)
[facility] [address] [place], [address] Phone: [phone]   Fax:  [phone]  Physical Therapy Treatment  Patient Details  Name: Kaylee Reyes MRN: [id number] Date of Birth: 09/21/1951 Referring Provider: Lelon Huh, MD   Encounter Date: 05/15/2018  PT End of Session - 05/15/18 1349    Visit Number  14    Number of Visits  25    Date for PT Re-Evaluation  06/15/18    PT Start Time  2836    PT Stop Time  1431    PT Time Calculation (min)  42 min    Activity Tolerance  Patient tolerated treatment well    Behavior During Therapy  [facility] for tasks assessed/performed       Past Medical History:  Diagnosis Date  . Anemia   . Cancer ([place]) 09/2015   breast cancer  . GERD (gastroesophageal reflux disease)   . History of chicken pox   . Hyperlipidemia   . Hypertension   . Hypothyroidism   . Neuropathy of foot    bilateral  . PONV (postoperative nausea and vomiting)    exploratory surgery at [facility]  . Thyroid disease   . Tuberculosis exposure    Mother had TB.  Pt shows (+) on tests, but has never had.  . Vertigo    last episode over 1 yr ago  . Wears dentures    partial upper and lower    Past Surgical History:  Procedure Laterality Date  . ABDOMINAL HYSTERECTOMY  07/13/2013   Total. with BSO for postmenopausal bleeding and fibroids with large cervical polyp. Dr. Levy Sjogren and Dr. Glennon Mac at [facility]  . ABDOMINAL ULTRASOUND  04/17/2011   Hepatomegaly with borderline slenomegaly. Suggestive of fatty liver. s/p cholecystectomy. Portions of aorta obscured  . BREAST SURGERY Right 2011   BREAST BIOPSY  . CESAREAN SECTION  1988  . CHOLECYSTECTOMY  1990  . COLONOSCOPY WITH PROPOFOL N/A 09/01/2017   Procedure: COLONOSCOPY WITH PROPOFOL;  Surgeon: [name], MD;  Location: [place];  Service: Gastroenterology;  Laterality: N/A;  . EXPLORATORY LAPAROTOMY     Left breast  . MASTECTOMY W/  SENTINEL NODE BIOPSY Bilateral 10/28/2015   Procedure: MASTECTOMY WITH SENTINEL LYMPH NODE BIOPSY;  Surgeon: Hubbard Robinson, MD;  Location: [facility];  Service: General;  Laterality: Bilateral;  . PORT-A-CATH REMOVAL Left 10/11/2016   Procedure: REMOVAL PORT-A-CATH;  Surgeon: Hubbard Robinson, MD;  Location: [facility];  Service: General;  Laterality: Left;  . PORTACATH PLACEMENT Left 12/23/2015   Procedure: INSERTION PORT-A-CATH;  Surgeon: Hubbard Robinson, MD;  Location: [facility];  Service: General;  Laterality: Left;  . WRIST SURGERY      There were no vitals filed for this visit.  Subjective Assessment - 05/15/18 1351    Subjective  Has been using a deflated ball as a pillow. Can't tell if she is turning her head more. Neck feels better when she wakes up after using the deflated ball and doing her exercises.  No pain in her neck, just stiffness.  Has been sleeping on it since Friday night.     Pertinent History  Neck pain began since the chemo and radiation. Might have also changed her posture after her breast surgeries 2 years ago. Neck pain started bothering her mid 2018.  Currently having a hard time turning her head both ways.  Neck pain feels better with treatment to decrease tension. The relief is temporary.  Currently using a pillow that is not as thick which is helping.     Patient Stated Goals  To be able to turn her head further, without pain, fix her hair.     Currently in Pain?  No/denies    Pain Score  0-No pain    Pain Onset  More than a month ago                               PT Education - 05/15/18 1353    Education provided  Yes    Education Details  ther-ex    [employer]) Educated  Patient    Methods  Explanation;Demonstration;Tactile cues;Verbal cues    Comprehension  Returned demonstration;Verbalized understanding         Objectives  43 degrees cervical rotation R and 45 degrees L at start of session   Therapeutic  exercise   Seated gentle manually resisted cervical flexion isometrics 10x3 with 5 seconds                         Then pt performed herself with thumb resistance under chin 10x5 seconds for 2 sets   Standing bilateral scapular retraction yellow band 2x5 seconds with chin tucks. Increased posterior neck tightness    Then for R scapula 5x5 seconds. No change in stiffness  Then for L scapula 5x5 seconds. No change in stiffness   Seated manually resisted scapular retraction targeting the lower trap muscles to help decrease upper trap muscle tension   R 10x5 seconds for 3 sets. Felt good  L 10x5 seconds for 3 sets   Try gentle manually resisted cervical rotation in sitting next visit if appropriate.    Improved exercise technique, movement at target joints, use of target muscles after mod verbal, visual, tactile cues.    Manual therapy:             Seated STM to bilateral posterior cervical paraspinal muscles                          Seated STM to R rhomboid muscles   Cervical rotation: 50 degrees R and L afterwards    Decreased neck stiffness with treatment to decrease upper trap muscle tension and improving lower trap and anterior cervical muscle use. Pt states neck feeling better after session.          PT Long Term Goals - 05/04/18 1601      PT LONG TERM GOAL #1   Title  Patient will have a decrease in R shoulder pain to 4/10 or less at worst to promote ability to fix her hair, raise her arm, and reach behind her back.     Baseline  8/10 at most for the past 2 months (03/23/2018); 3/10 at most for the past 7 days (05/04/2018)    Time  6    Period  Weeks    Status  Achieved      PT LONG TERM GOAL #2   Title  Patient will improve R shoulder ER strength by at least 1/2 MMT grade to promote ability to raise her arm up with less pain.     Time  6    Period  Weeks    Status  Achieved      PT LONG TERM GOAL #3   Title  Patient will improve R shoulder flexion  AROM  to at least 130 degrees flexion consistently to promote ability to raise her R arm and reach.     Baseline  114 degrees R shoulder flexion (03/23/2018); 130 degrees flexion (05/04/2018)    Time  6    Period  Weeks    Status  Revised    Target Date  06/15/18      PT LONG TERM GOAL #4   Title  Patient will improve her shoulder FOTO score by at least 10 points as a demonstration of improved function.     Baseline  shoulder FOTO 55 (03/23/2018); 53 (pt might have answered initial survey based on her neck) 04/19/2018    Time  6    Period  Weeks    Status  On-going    Target Date  06/15/18      PT LONG TERM GOAL #5   Title  Patient will improve her Quick Dash score by at least 10% as a demonstration of improved function.     Baseline  54.5% (03/23/2018); 27.3% (04/19/2018)    Time  6    Period  Weeks    Status  Achieved      Additional Long Term Goals   Additional Long Term Goals  Yes      PT LONG TERM GOAL #6   Title  Patient will improved cervical AROM to at least 50 degrees R and L consistently to promote ability to look around as well as drive.     Baseline  Cervical AROM: 40 degrees R and L rotation (05/04/2018)    Time  6    Period  Weeks    Status  New    Target Date  06/15/18      PT LONG TERM GOAL #7   Title  Patient will have a decrease neck pain to 2/10 or less at worst to promote ability to look around, especially when driving.     Baseline  5/10 neck pain at most for the past 2 months (05/04/2018)    Time  6    Period  Weeks    Status  New    Target Date  06/15/18            Plan - 05/15/18 1356    Clinical Impression Statement  Decreased neck stiffness with treatment to decrease upper trap muscle tension and improving lower trap and anterior cervical muscle use. Pt states neck feeling better after session.      Rehab Potential  Fair    Clinical Impairments Affecting Rehab Potential  chronicity of condition, pain, hx of breast CA and radiation and chemotherapy     PT Frequency  2x / week    PT Duration  6 weeks    PT Treatment/Interventions  Therapeutic activities;Therapeutic exercise;Aquatic Therapy;Electrical Stimulation;Iontophoresis 4mg /ml Dexamethasone;Neuromuscular re-education;Patient/family education;Manual techniques;Dry needling    PT Next Visit Plan  cervical muscle strengthening, gentle scapular, and rotator cuff strengthening, manual techniques, ROM    Consulted and Agree with Plan of Care  Patient       Patient will benefit from skilled therapeutic intervention in order to improve the following deficits and impairments:  Pain, Postural dysfunction, Improper body mechanics, Decreased strength, Decreased range of motion  Visit Diagnosis: Cervicalgia  Muscle weakness (generalized)     Problem List Patient Active Problem List   Diagnosis Date Noted  . Constipation 04/07/2018  . Candida infection 05/18/2017  . Menopause 03/23/2017  . Rectocele 03/23/2017  . Cystocele, midline 03/23/2017  . Vaginal atrophy 03/23/2017  .  Malignant neoplasm of right breast in female, estrogen receptor positive ([place])   . Left breast cancer with T3 tumor, >5 cm in greatest dimension ([place])   . S/P bilateral mastectomy 10/28/2015  . Left breast lump 10/15/2015  . Fatty infiltration of liver 10/01/2015  . Gonalgia 10/01/2015  . Body tinea 10/01/2015  . Invasive lobular carcinoma of right breast, stage 2 ([place]) 10/01/2015  . Hypothyroidism 09/25/2015  . Morbid obesity ([place]) 09/25/2015  . Hypercholesterolemia 09/25/2015  . Hypertension 09/25/2015  . Allergic rhinitis 09/25/2015  . GERD (gastroesophageal reflux disease) 09/25/2015  . Alopecia 09/25/2015  . Lump or mass in breast 09/25/2015  . History of peptic ulcer disease 09/25/2015  . Eczema 09/25/2015   Joneen Boers PT, DPT   05/15/2018, 6:57 PM  [facility] [address], [place], [address] Phone: [phone]   Fax:   [phone]  Name: ILISA HAYWORTH MRN: [id number] Date of Birth: [birth year]

## 2018-05-18 ENCOUNTER — Ambulatory Visit: Payer: Medicare Other

## 2018-05-18 DIAGNOSIS — M542 Cervicalgia: Secondary | ICD-10-CM | POA: Diagnosis not present

## 2018-05-18 DIAGNOSIS — M25511 Pain in right shoulder: Secondary | ICD-10-CM | POA: Diagnosis not present

## 2018-05-18 DIAGNOSIS — M6281 Muscle weakness (generalized): Secondary | ICD-10-CM | POA: Diagnosis not present

## 2018-05-18 DIAGNOSIS — G8929 Other chronic pain: Secondary | ICD-10-CM | POA: Diagnosis not present

## 2018-05-18 NOTE — Therapy (Signed)
[facility] [address] [place], [address] Phone: [phone]   Fax:  [phone]  Physical Therapy Treatment  Patient Details  Name: Kaylee Reyes MRN: [id number] Date of Birth: May 20, 1951 Referring Provider: Lelon Huh, MD   Encounter Date: 05/18/2018  PT End of Session - 05/18/18 0949    Visit Number  15    Number of Visits  25    Date for PT Re-Evaluation  06/15/18    PT Start Time  0949    PT Stop Time  1033    PT Time Calculation (min)  44 min    Activity Tolerance  Patient tolerated treatment well    Behavior During Therapy  [facility] for tasks assessed/performed       Past Medical History:  Diagnosis Date  . Anemia   . Cancer ([place]) 09/2015   breast cancer  . GERD (gastroesophageal reflux disease)   . History of chicken pox   . Hyperlipidemia   . Hypertension   . Hypothyroidism   . Neuropathy of foot    bilateral  . PONV (postoperative nausea and vomiting)    exploratory surgery at [facility]  . Thyroid disease   . Tuberculosis exposure    Mother had TB.  Pt shows (+) on tests, but has never had.  . Vertigo    last episode over 1 yr ago  . Wears dentures    partial upper and lower    Past Surgical History:  Procedure Laterality Date  . ABDOMINAL HYSTERECTOMY  07/13/2013   Total. with BSO for postmenopausal bleeding and fibroids with large cervical polyp. Dr. Levy Sjogren and Dr. Glennon Mac at [facility]  . ABDOMINAL ULTRASOUND  04/17/2011   Hepatomegaly with borderline slenomegaly. Suggestive of fatty liver. s/p cholecystectomy. Portions of aorta obscured  . BREAST SURGERY Right 2011   BREAST BIOPSY  . CESAREAN SECTION  1988  . CHOLECYSTECTOMY  1990  . COLONOSCOPY WITH PROPOFOL N/A 09/01/2017   Procedure: COLONOSCOPY WITH PROPOFOL;  Surgeon: [name], MD;  Location: [place];  Service: Gastroenterology;  Laterality: N/A;  . EXPLORATORY LAPAROTOMY     Left breast  . MASTECTOMY W/  SENTINEL NODE BIOPSY Bilateral 10/28/2015   Procedure: MASTECTOMY WITH SENTINEL LYMPH NODE BIOPSY;  Surgeon: Hubbard Robinson, MD;  Location: [facility];  Service: General;  Laterality: Bilateral;  . PORT-A-CATH REMOVAL Left 10/11/2016   Procedure: REMOVAL PORT-A-CATH;  Surgeon: Hubbard Robinson, MD;  Location: [facility];  Service: General;  Laterality: Left;  . PORTACATH PLACEMENT Left 12/23/2015   Procedure: INSERTION PORT-A-CATH;  Surgeon: Hubbard Robinson, MD;  Location: [facility];  Service: General;  Laterality: Left;  . WRIST SURGERY      There were no vitals filed for this visit.  Subjective Assessment - 05/18/18 0952    Subjective  Slept on the deflated ball last night. Also did her HEP. Not hurting, just feels stiff. The yellow T-band scapular retraction bothered her R arm the evening after last session. Went away the next day.  Felt like a nerve.  Sleeping with her head on a deflated ball helps decrease bilateral lateral neck pain.     Pertinent History  Neck pain began since the chemo and radiation. Might have also changed her posture after her breast surgeries 2 years ago. Neck pain started bothering her mid 2018.  Currently having a hard time turning her head both ways.  Neck pain feels better with treatment to decrease tension.  The relief is temporary.  Currently using a pillow that is not as thick which is helping.     Patient Stated Goals  To be able to turn her head further, without pain, fix her hair.     Currently in Pain?  No/denies    Pain Score  0-No pain    Pain Onset  More than a month ago                               PT Education - 05/18/18 1020    Education provided  Yes    Education Details  ther-ex    [employer]) Educated  Patient    Methods  Explanation;Demonstration;Tactile cues;Verbal cues    Comprehension  Returned demonstration;Verbalized understanding          Objectives  43degrees cervical rotation R and43 degreesL at  start of session   Therapeutic exercise  Seated cervical rotation bilaterally  Arm circumference 10 cm proximal to elbow prior to exercises             R: 47 cm             L: 50 cm  Seated bilateral shoulder flexion with scapular retraction and depression 10x2   Seated manually resisted scapular depression isometrics with PT manual resistance 5x5 seconds for 3 sets each side  Supine chin tucks with towel roll behind thoracic spine to promote thoracic extension to decrease lower cervical extension 10x5 seconds for 2 sets   Improved exercise technique, movement at target joints, use of target muscles after min to mod verbal, visual, tactile cues.     Manual therapy:  Seated STM to bilateral posterior cervical paraspinal muscles  Seated STM to R and L rhomboid/upper trap muscles    Cervical rotation 52 degrees R, 45 degrees L.  Increased neck stiffness with cervical retraction exercise. Decreased stiffness with STM to decrease cervical paraspinal and rhomboid/upper trap muslce tension.       PT Long Term Goals - 05/04/18 1601      PT LONG TERM GOAL #1   Title  Patient will have a decrease in R shoulder pain to 4/10 or less at worst to promote ability to fix her hair, raise her arm, and reach behind her back.     Baseline  8/10 at most for the past 2 months (03/23/2018); 3/10 at most for the past 7 days (05/04/2018)    Time  6    Period  Weeks    Status  Achieved      PT LONG TERM GOAL #2   Title  Patient will improve R shoulder ER strength by at least 1/2 MMT grade to promote ability to raise her arm up with less pain.     Time  6    Period  Weeks    Status  Achieved      PT LONG TERM GOAL #3   Title  Patient will improve R shoulder flexion AROM to at least 130 degrees flexion consistently to promote ability to raise her R arm and reach.     Baseline  114 degrees R shoulder flexion (03/23/2018); 130 degrees flexion (05/04/2018)    Time  6     Period  Weeks    Status  Revised    Target Date  06/15/18      PT LONG TERM GOAL #4   Title  Patient will improve her shoulder FOTO score by  at least 10 points as a demonstration of improved function.     Baseline  shoulder FOTO 55 (03/23/2018); 53 (pt might have answered initial survey based on her neck) 04/19/2018    Time  6    Period  Weeks    Status  On-going    Target Date  06/15/18      PT LONG TERM GOAL #5   Title  Patient will improve her Quick Dash score by at least 10% as a demonstration of improved function.     Baseline  54.5% (03/23/2018); 27.3% (04/19/2018)    Time  6    Period  Weeks    Status  Achieved      Additional Long Term Goals   Additional Long Term Goals  Yes      PT LONG TERM GOAL #6   Title  Patient will improved cervical AROM to at least 50 degrees R and L consistently to promote ability to look around as well as drive.     Baseline  Cervical AROM: 40 degrees R and L rotation (05/04/2018)    Time  6    Period  Weeks    Status  New    Target Date  06/15/18      PT LONG TERM GOAL #7   Title  Patient will have a decrease neck pain to 2/10 or less at worst to promote ability to look around, especially when driving.     Baseline  5/10 neck pain at most for the past 2 months (05/04/2018)    Time  6    Period  Weeks    Status  New    Target Date  06/15/18            Plan - 05/18/18 1021    Clinical Impression Statement  Increased neck stiffness with cervical retraction exercise. Decreased stiffness with STM to decrease cervical paraspinal and rhomboid/upper trap muslce tension.     Rehab Potential  Fair    Clinical Impairments Affecting Rehab Potential  chronicity of condition, pain, hx of breast CA and radiation and chemotherapy    PT Frequency  2x / week    PT Duration  6 weeks    PT Treatment/Interventions  Therapeutic activities;Therapeutic exercise;Aquatic Therapy;Electrical Stimulation;Iontophoresis 4mg /ml Dexamethasone;Neuromuscular  re-education;Patient/family education;Manual techniques;Dry needling    PT Next Visit Plan  cervical muscle strengthening, gentle scapular, and rotator cuff strengthening, manual techniques, ROM    Consulted and Agree with Plan of Care  Patient       Patient will benefit from skilled therapeutic intervention in order to improve the following deficits and impairments:  Pain, Postural dysfunction, Improper body mechanics, Decreased strength, Decreased range of motion  Visit Diagnosis: Muscle weakness (generalized)  Cervicalgia     Problem List Patient Active Problem List   Diagnosis Date Noted  . Constipation 04/07/2018  . Candida infection 05/18/2017  . Menopause 03/23/2017  . Rectocele 03/23/2017  . Cystocele, midline 03/23/2017  . Vaginal atrophy 03/23/2017  . Malignant neoplasm of right breast in female, estrogen receptor positive ([place])   . Left breast cancer with T3 tumor, >5 cm in greatest dimension ([place])   . S/P bilateral mastectomy 10/28/2015  . Left breast lump 10/15/2015  . Fatty infiltration of liver 10/01/2015  . Gonalgia 10/01/2015  . Body tinea 10/01/2015  . Invasive lobular carcinoma of right breast, stage 2 ([place]) 10/01/2015  . Hypothyroidism 09/25/2015  . Morbid obesity ([place]) 09/25/2015  . Hypercholesterolemia 09/25/2015  . Hypertension 09/25/2015  .  Allergic rhinitis 09/25/2015  . GERD (gastroesophageal reflux disease) 09/25/2015  . Alopecia 09/25/2015  . Lump or mass in breast 09/25/2015  . History of peptic ulcer disease 09/25/2015  . Eczema 09/25/2015    Joneen Boers PT, DPT   05/18/2018, 7:32 PM  [facility] [address] [place], [address] Phone: [phone]   Fax:  [phone]  Name: Kaylee Reyes MRN: [id number] Date of Birth: 1951/05/24

## 2018-05-23 ENCOUNTER — Ambulatory Visit: Payer: Medicare Other

## 2018-05-24 ENCOUNTER — Ambulatory Visit: Payer: Medicare Other

## 2018-05-24 DIAGNOSIS — M25511 Pain in right shoulder: Secondary | ICD-10-CM | POA: Diagnosis not present

## 2018-05-24 DIAGNOSIS — G8929 Other chronic pain: Secondary | ICD-10-CM | POA: Diagnosis not present

## 2018-05-24 DIAGNOSIS — M542 Cervicalgia: Secondary | ICD-10-CM

## 2018-05-24 DIAGNOSIS — M6281 Muscle weakness (generalized): Secondary | ICD-10-CM

## 2018-05-24 NOTE — Therapy (Signed)
[facility] [address] [place], [address] Phone: [phone]   Fax:  [phone]  Physical Therapy Treatment  Patient Details  Name: Kaylee Reyes MRN: [id number] Date of Birth: 1951-08-25 Referring Provider: Lelon Huh, MD   Encounter Date: 05/24/2018  PT End of Session - 05/24/18 1158    Visit Number  16    Number of Visits  25    Date for PT Re-Evaluation  06/15/18    PT Start Time  1158 pt arrived late    PT Stop Time  1220    PT Time Calculation (min)  22 min    Activity Tolerance  Patient tolerated treatment well    Behavior During Therapy  [facility] for tasks assessed/performed       Past Medical History:  Diagnosis Date  . Anemia   . Cancer ([place]) 09/2015   breast cancer  . GERD (gastroesophageal reflux disease)   . History of chicken pox   . Hyperlipidemia   . Hypertension   . Hypothyroidism   . Neuropathy of foot    bilateral  . PONV (postoperative nausea and vomiting)    exploratory surgery at [facility]  . Thyroid disease   . Tuberculosis exposure    Mother had TB.  Pt shows (+) on tests, but has never had.  . Vertigo    last episode over 1 yr ago  . Wears dentures    partial upper and lower    Past Surgical History:  Procedure Laterality Date  . ABDOMINAL HYSTERECTOMY  07/13/2013   Total. with BSO for postmenopausal bleeding and fibroids with large cervical polyp. Dr. Levy Sjogren and Dr. Glennon Mac at [facility]  . ABDOMINAL ULTRASOUND  04/17/2011   Hepatomegaly with borderline slenomegaly. Suggestive of fatty liver. s/p cholecystectomy. Portions of aorta obscured  . BREAST SURGERY Right 2011   BREAST BIOPSY  . CESAREAN SECTION  1988  . CHOLECYSTECTOMY  1990  . COLONOSCOPY WITH PROPOFOL N/A 09/01/2017   Procedure: COLONOSCOPY WITH PROPOFOL;  Surgeon: [name], MD;  Location: [facility];  Service: Gastroenterology;  Laterality: N/A;  . EXPLORATORY LAPAROTOMY     Left breast  .  MASTECTOMY W/ SENTINEL NODE BIOPSY Bilateral 10/28/2015   Procedure: MASTECTOMY WITH SENTINEL LYMPH NODE BIOPSY;  Surgeon: Hubbard Robinson, MD;  Location: [facility];  Service: General;  Laterality: Bilateral;  . PORT-A-CATH REMOVAL Left 10/11/2016   Procedure: REMOVAL PORT-A-CATH;  Surgeon: Hubbard Robinson, MD;  Location: [facility];  Service: General;  Laterality: Left;  . PORTACATH PLACEMENT Left 12/23/2015   Procedure: INSERTION PORT-A-CATH;  Surgeon: Hubbard Robinson, MD;  Location: [facility];  Service: General;  Laterality: Left;  . WRIST SURGERY      There were no vitals filed for this visit.  Subjective Assessment - 05/24/18 1216    Subjective  The deflated ball pillow helped until yesterday. The pain in the neck has been better since sleeping on the deflated ball and doing the exercises on it. Thinking about purchasing a water pillow.     Pertinent History  Neck pain began since the chemo and radiation. Might have also changed her posture after her breast surgeries 2 years ago. Neck pain started bothering her mid 2018.  Currently having a hard time turning her head both ways.  Neck pain feels better with treatment to decrease tension. The relief is temporary.  Currently using a pillow that is not as thick which is helping.  Patient Stated Goals  To be able to turn her head further, without pain, fix her hair.     Currently in Pain?  No/denies    Pain Score  -- just stiffness    Pain Onset  More than a month ago                               PT Education - 05/24/18 1233    Education provided  Yes    Education Details  ther-ex    [employer]) Educated  Patient    Methods  Explanation;Demonstration;Tactile cues;Verbal cues    Comprehension  Returned demonstration;Verbalized understanding          Objectives   Manual therapy: Seated STM to bilateral posterior cervical paraspinal muscles R > L  Seated STM to R  rhomboid/upper trap muscles     Cervical rotation 48 degrees R, 53 degrees L afterwards  Pt arrived about 30 minutes late to session. Worked on manual therapy to help decrease cervical paraspinal and rhomboid muscle tension. Pt states neck feels better after manual therapy.     PT Long Term Goals - 05/04/18 1601      PT LONG TERM GOAL #1   Title  Patient will have a decrease in R shoulder pain to 4/10 or less at worst to promote ability to fix her hair, raise her arm, and reach behind her back.     Baseline  8/10 at most for the past 2 months (03/23/2018); 3/10 at most for the past 7 days (05/04/2018)    Time  6    Period  Weeks    Status  Achieved      PT LONG TERM GOAL #2   Title  Patient will improve R shoulder ER strength by at least 1/2 MMT grade to promote ability to raise her arm up with less pain.     Time  6    Period  Weeks    Status  Achieved      PT LONG TERM GOAL #3   Title  Patient will improve R shoulder flexion AROM to at least 130 degrees flexion consistently to promote ability to raise her R arm and reach.     Baseline  114 degrees R shoulder flexion (03/23/2018); 130 degrees flexion (05/04/2018)    Time  6    Period  Weeks    Status  Revised    Target Date  06/15/18      PT LONG TERM GOAL #4   Title  Patient will improve her shoulder FOTO score by at least 10 points as a demonstration of improved function.     Baseline  shoulder FOTO 55 (03/23/2018); 53 (pt might have answered initial survey based on her neck) 04/19/2018    Time  6    Period  Weeks    Status  On-going    Target Date  06/15/18      PT LONG TERM GOAL #5   Title  Patient will improve her Quick Dash score by at least 10% as a demonstration of improved function.     Baseline  54.5% (03/23/2018); 27.3% (04/19/2018)    Time  6    Period  Weeks    Status  Achieved      Additional Long Term Goals   Additional Long Term Goals  Yes      PT LONG TERM GOAL #6   Title  Patient will improved  cervical  AROM to at least 50 degrees R and L consistently to promote ability to look around as well as drive.     Baseline  Cervical AROM: 40 degrees R and L rotation (05/04/2018)    Time  6    Period  Weeks    Status  New    Target Date  06/15/18      PT LONG TERM GOAL #7   Title  Patient will have a decrease neck pain to 2/10 or less at worst to promote ability to look around, especially when driving.     Baseline  5/10 neck pain at most for the past 2 months (05/04/2018)    Time  6    Period  Weeks    Status  New    Target Date  06/15/18            Plan - 05/24/18 1228    Clinical Impression Statement  Pt arrived about 30 minutes late to session. Worked on manual therapy to help decrease cervical paraspinal and rhomboid muscle tension. Pt states neck feels better after manual therapy.     Rehab Potential  Fair    Clinical Impairments Affecting Rehab Potential  chronicity of condition, pain, hx of breast CA and radiation and chemotherapy    PT Frequency  2x / week    PT Duration  6 weeks    PT Treatment/Interventions  Therapeutic activities;Therapeutic exercise;Aquatic Therapy;Electrical Stimulation;Iontophoresis 4mg /ml Dexamethasone;Neuromuscular re-education;Patient/family education;Manual techniques;Dry needling    PT Next Visit Plan  cervical muscle strengthening, gentle scapular, and rotator cuff strengthening, manual techniques, ROM    Consulted and Agree with Plan of Care  Patient       Patient will benefit from skilled therapeutic intervention in order to improve the following deficits and impairments:  Pain, Postural dysfunction, Improper body mechanics, Decreased strength, Decreased range of motion  Visit Diagnosis: Muscle weakness (generalized)  Cervicalgia     Problem List Patient Active Problem List   Diagnosis Date Noted  . Constipation 04/07/2018  . Candida infection 05/18/2017  . Menopause 03/23/2017  . Rectocele 03/23/2017  . Cystocele, midline 03/23/2017   . Vaginal atrophy 03/23/2017  . Malignant neoplasm of right breast in female, estrogen receptor positive ([place])   . Left breast cancer with T3 tumor, >5 cm in greatest dimension ([place])   . S/P bilateral mastectomy 10/28/2015  . Left breast lump 10/15/2015  . Fatty infiltration of liver 10/01/2015  . Gonalgia 10/01/2015  . Body tinea 10/01/2015  . Invasive lobular carcinoma of right breast, stage 2 ([place]) 10/01/2015  . Hypothyroidism 09/25/2015  . Morbid obesity ([place]) 09/25/2015  . Hypercholesterolemia 09/25/2015  . Hypertension 09/25/2015  . Allergic rhinitis 09/25/2015  . GERD (gastroesophageal reflux disease) 09/25/2015  . Alopecia 09/25/2015  . Lump or mass in breast 09/25/2015  . History of peptic ulcer disease 09/25/2015  . Eczema 09/25/2015    Joneen Boers PT, DPT   05/24/2018, 12:36 PM  [facility] [address], [place], [address] Phone: [phone]   Fax:  [phone]  Name: THEODOSIA BAHENA MRN: [id number] Date of Birth: 10-13-1951

## 2018-05-29 ENCOUNTER — Ambulatory Visit: Payer: Medicare Other

## 2018-05-29 DIAGNOSIS — G8929 Other chronic pain: Secondary | ICD-10-CM | POA: Diagnosis not present

## 2018-05-29 DIAGNOSIS — M542 Cervicalgia: Secondary | ICD-10-CM | POA: Diagnosis not present

## 2018-05-29 DIAGNOSIS — M6281 Muscle weakness (generalized): Secondary | ICD-10-CM | POA: Diagnosis not present

## 2018-05-29 DIAGNOSIS — M25511 Pain in right shoulder: Secondary | ICD-10-CM | POA: Diagnosis not present

## 2018-05-29 NOTE — Therapy (Signed)
[facility] [address], [place], [address] Phone: [phone]   Fax:  [phone]  Physical Therapy Treatment  Patient Details  Name: Kaylee Reyes MRN: [id number] Date of Birth: 06/14/1951 Referring Provider: Lelon Huh, MD   Encounter Date: 05/29/2018  PT End of Session - 05/29/18 1607    Visit Number  17    Number of Visits  25    Date for PT Re-Evaluation  06/15/18    PT Start Time  1608    PT Stop Time  1653    PT Time Calculation (min)  45 min    Activity Tolerance  Patient tolerated treatment well    Behavior During Therapy  [facility] for tasks assessed/performed       Past Medical History:  Diagnosis Date  . Anemia   . Cancer ([place]) 09/2015   breast cancer  . GERD (gastroesophageal reflux disease)   . History of chicken pox   . Hyperlipidemia   . Hypertension   . Hypothyroidism   . Neuropathy of foot    bilateral  . PONV (postoperative nausea and vomiting)    exploratory surgery at [facility]  . Thyroid disease   . Tuberculosis exposure    Mother had TB.  Pt shows (+) on tests, but has never had.  . Vertigo    last episode over 1 yr ago  . Wears dentures    partial upper and lower    Past Surgical History:  Procedure Laterality Date  . ABDOMINAL HYSTERECTOMY  07/13/2013   Total. with BSO for postmenopausal bleeding and fibroids with large cervical polyp. Dr. Levy Sjogren and Dr. Glennon Mac at [facility]  . ABDOMINAL ULTRASOUND  04/17/2011   Hepatomegaly with borderline slenomegaly. Suggestive of fatty liver. s/p cholecystectomy. Portions of aorta obscured  . BREAST SURGERY Right 2011   BREAST BIOPSY  . CESAREAN SECTION  1988  . CHOLECYSTECTOMY  1990  . COLONOSCOPY WITH PROPOFOL N/A 09/01/2017   Procedure: COLONOSCOPY WITH PROPOFOL;  Surgeon: [name], MD;  Location: [facility];  Service: Gastroenterology;  Laterality: N/A;  . EXPLORATORY LAPAROTOMY     Left breast  . MASTECTOMY W/  SENTINEL NODE BIOPSY Bilateral 10/28/2015   Procedure: MASTECTOMY WITH SENTINEL LYMPH NODE BIOPSY;  Surgeon: Hubbard Robinson, MD;  Location: [facility];  Service: General;  Laterality: Bilateral;  . PORT-A-CATH REMOVAL Left 10/11/2016   Procedure: REMOVAL PORT-A-CATH;  Surgeon: Hubbard Robinson, MD;  Location: [facility];  Service: General;  Laterality: Left;  . PORTACATH PLACEMENT Left 12/23/2015   Procedure: INSERTION PORT-A-CATH;  Surgeon: Hubbard Robinson, MD;  Location: [facility];  Service: General;  Laterality: Left;  . WRIST SURGERY      There were no vitals filed for this visit.  Subjective Assessment - 05/29/18 1612    Subjective  Got the water pillow and not sure if it helps. Neck feels stiff today. Neck feels more stiff when she does not do her exercises.     Pertinent History  Neck pain began since the chemo and radiation. Might have also changed her posture after her breast surgeries 2 years ago. Neck pain started bothering her mid 2018.  Currently having a hard time turning her head both ways.  Neck pain feels better with treatment to decrease tension. The relief is temporary.  Currently using a pillow that is not as thick which is helping.     Patient Stated Goals  To be able to turn  her head further, without pain, fix her hair.     Currently in Pain?  No/denies    Pain Onset  More than a month ago                               PT Education - 05/29/18 1620    Education provided  Yes    Education Details  ther-ex    [employer]) Educated  Patient    Methods  Explanation;Demonstration;Tactile cues;Verbal cues    Comprehension  Returned demonstration;Verbalized understanding         Objectives  45degrees cervical rotation R and48degreesL at start of session   Therapeutic exercise  Standing bilateral shoulder flexion with scapular retraction 10x2. Pain free range. Possible increase in neck tightness afterwards  Seated thoracic extension  over chair 10x5 seconds for 3 sets   Seated cervical flexion isometrics, with gentle manual resistance from PT 10x3 with 5 second holds   Seated bilateral scapular retraction targeting the lower trap muscles 10x5 seconds for 3 sets   Then with cervical rotation 3x, then 5x.  each direction. Increased tension in neck during second set  Seated manually resisted gentle eccentric cervical rotation   R 5x2  L 5x    Then R and L alternating 5x2   50 degrees R cervical and 55 degrees L cervical rotation afterwards.   Pt states neck does not feel as stiff now as when she started this session   Improved exercise technique, movement at target joints, use of target muscles after min to mod verbal, visual, tactile cues.   Improved cervical rotation bilaterally with gentle eccentric muscle activation of cervical rotator muscles (rotatores) and lower trap muscles.     PT Long Term Goals - 05/04/18 1601      PT LONG TERM GOAL #1   Title  Patient will have a decrease in R shoulder pain to 4/10 or less at worst to promote ability to fix her hair, raise her arm, and reach behind her back.     Baseline  8/10 at most for the past 2 months (03/23/2018); 3/10 at most for the past 7 days (05/04/2018)    Time  6    Period  Weeks    Status  Achieved      PT LONG TERM GOAL #2   Title  Patient will improve R shoulder ER strength by at least 1/2 MMT grade to promote ability to raise her arm up with less pain.     Time  6    Period  Weeks    Status  Achieved      PT LONG TERM GOAL #3   Title  Patient will improve R shoulder flexion AROM to at least 130 degrees flexion consistently to promote ability to raise her R arm and reach.     Baseline  114 degrees R shoulder flexion (03/23/2018); 130 degrees flexion (05/04/2018)    Time  6    Period  Weeks    Status  Revised    Target Date  06/15/18      PT LONG TERM GOAL #4   Title  Patient will improve her shoulder FOTO score by at least 10 points as a  demonstration of improved function.     Baseline  shoulder FOTO 55 (03/23/2018); 53 (pt might have answered initial survey based on her neck) 04/19/2018    Time  6    Period  Weeks  Status  On-going    Target Date  06/15/18      PT LONG TERM GOAL #5   Title  Patient will improve her Quick Dash score by at least 10% as a demonstration of improved function.     Baseline  54.5% (03/23/2018); 27.3% (04/19/2018)    Time  6    Period  Weeks    Status  Achieved      Additional Long Term Goals   Additional Long Term Goals  Yes      PT LONG TERM GOAL #6   Title  Patient will improved cervical AROM to at least 50 degrees R and L consistently to promote ability to look around as well as drive.     Baseline  Cervical AROM: 40 degrees R and L rotation (05/04/2018)    Time  6    Period  Weeks    Status  New    Target Date  06/15/18      PT LONG TERM GOAL #7   Title  Patient will have a decrease neck pain to 2/10 or less at worst to promote ability to look around, especially when driving.     Baseline  5/10 neck pain at most for the past 2 months (05/04/2018)    Time  6    Period  Weeks    Status  New    Target Date  06/15/18            Plan - 05/29/18 1610    Clinical Impression Statement  Improved cervical rotation bilaterally with gentle eccentric muscle activation of cervical rotator muscles (rotatores) and lower trap muscles.     Rehab Potential  Fair    Clinical Impairments Affecting Rehab Potential  chronicity of condition, pain, hx of breast CA and radiation and chemotherapy    PT Frequency  2x / week    PT Duration  6 weeks    PT Treatment/Interventions  Therapeutic activities;Therapeutic exercise;Aquatic Therapy;Electrical Stimulation;Iontophoresis 4mg /ml Dexamethasone;Neuromuscular re-education;Patient/family education;Manual techniques;Dry needling    PT Next Visit Plan  cervical muscle strengthening, gentle scapular, and rotator cuff strengthening, manual techniques, ROM     Consulted and Agree with Plan of Care  Patient       Patient will benefit from skilled therapeutic intervention in order to improve the following deficits and impairments:  Pain, Postural dysfunction, Improper body mechanics, Decreased strength, Decreased range of motion  Visit Diagnosis: Muscle weakness (generalized)  Cervicalgia     Problem List Patient Active Problem List   Diagnosis Date Noted  . Constipation 04/07/2018  . Candida infection 05/18/2017  . Menopause 03/23/2017  . Rectocele 03/23/2017  . Cystocele, midline 03/23/2017  . Vaginal atrophy 03/23/2017  . Malignant neoplasm of right breast in female, estrogen receptor positive ([place])   . Left breast cancer with T3 tumor, >5 cm in greatest dimension ([place])   . S/P bilateral mastectomy 10/28/2015  . Left breast lump 10/15/2015  . Fatty infiltration of liver 10/01/2015  . Gonalgia 10/01/2015  . Body tinea 10/01/2015  . Invasive lobular carcinoma of right breast, stage 2 ([place]) 10/01/2015  . Hypothyroidism 09/25/2015  . Morbid obesity ([place]) 09/25/2015  . Hypercholesterolemia 09/25/2015  . Hypertension 09/25/2015  . Allergic rhinitis 09/25/2015  . GERD (gastroesophageal reflux disease) 09/25/2015  . Alopecia 09/25/2015  . Lump or mass in breast 09/25/2015  . History of peptic ulcer disease 09/25/2015  . Eczema 09/25/2015    Joneen Boers PT, DPT   05/29/2018, 5:07 PM  [facility] [address] [place], [address] Phone: [phone]   Fax:  [phone]  Name: DENEICE WACK MRN: [id number] Date of Birth: 1951-11-23

## 2018-05-31 ENCOUNTER — Ambulatory Visit: Payer: Medicare Other

## 2018-06-05 ENCOUNTER — Ambulatory Visit: Payer: Medicare Other

## 2018-06-07 ENCOUNTER — Ambulatory Visit: Payer: Medicare Other

## 2018-06-12 ENCOUNTER — Ambulatory Visit: Payer: Medicare Other

## 2018-06-15 ENCOUNTER — Ambulatory Visit: Payer: Medicare Other

## 2018-06-30 ENCOUNTER — Ambulatory Visit
Admission: RE | Admit: 2018-06-30 | Discharge: 2018-06-30 | Disposition: A | Discharge status: Home / Self Care | Payer: Medicare Other | Source: Ambulatory Visit | Attending: Oncology | Admitting: Oncology

## 2018-06-30 ENCOUNTER — Inpatient Hospital Stay: Payer: Medicare Other | Attending: Nurse Practitioner | Admitting: Oncology

## 2018-06-30 ENCOUNTER — Telehealth: Payer: Self-pay | Admitting: *Deleted

## 2018-06-30 ENCOUNTER — Other Ambulatory Visit: Payer: Self-pay

## 2018-06-30 ENCOUNTER — Other Ambulatory Visit: Payer: Self-pay | Admitting: Oncology

## 2018-06-30 VITALS — BP 139/79 | HR 56 | Temp 98.5°F | Resp 18

## 2018-06-30 DIAGNOSIS — M25551 Pain in right hip: Secondary | ICD-10-CM

## 2018-06-30 DIAGNOSIS — Z79811 Long term (current) use of aromatase inhibitors: Secondary | ICD-10-CM | POA: Insufficient documentation

## 2018-06-30 DIAGNOSIS — M160 Bilateral primary osteoarthritis of hip: Secondary | ICD-10-CM | POA: Insufficient documentation

## 2018-06-30 DIAGNOSIS — Z853 Personal history of malignant neoplasm of breast: Secondary | ICD-10-CM | POA: Insufficient documentation

## 2018-06-30 MED ORDER — ERGOCALCIFEROL 1.25 MG (50000 UT) PO CAPS
50000.0000 [IU] | ORAL_CAPSULE | ORAL | 0 refills | Status: DC
Start: 2018-06-30 — End: 2019-07-17

## 2018-06-30 NOTE — Progress Notes (Addendum)
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  Chief complaint/ Reason for visit-bony pain  Heme/Onc history: Patient was last seen by Dr. Grayland Ormond on 03/2018 where she returns for six-month evaluation.  She was tolerating letrozole well without any side effects.  She continued to complain of mild neuropathy and occasional hot flashes and right knee pain.  Noted improvement with intentional weight loss.   In the interim, she began physical therapy for right shoulder and back pain.  Completed in June 2019 with improvement.  Oncology History   Patient with initial diagnosis in  2016 who noticed an enlarging mass in the right breast on self-examination.  Mammogram revealed a 3.6 cm mass of the right breast.  This was followed by a breast MRI showing another 4 mm lesion in the right breast as well as an 8 mm mass in the central left breast.  The biopsy-proven malignancy in this right breast measures 4.8 cm PET scan revealed no evidence of hypermetabolic activity.  Patient opted for bilateral mastectomies (10/2015).  Margins were clear.  2 out of 6 sentinel lymph nodes were positive for metastatic disease.  Course was complicated by bilateral flap necrosis and infection. Completed 4 cycles of Cytoxan and Taxotere in March 2017.  Completed radiation  to right chest wall and peripheral lymphatics in July 2017.  Began letrozole in July 2017.  DEXA scan revealed a T score of -0.8 at initiation of letrozole     Invasive lobular carcinoma of right breast, stage 2 ([place])   10/01/2015 Initial Diagnosis    Invasive lobular carcinoma of right breast, stage 2 ([facility])         Interval history-Patient presents to symptom management clinic for complaints of right-sided hip pain that radiates to pelvis and down leg.  Symptoms began approximately 3 to 4 months ago.  They have progressively worsened.  Describes pain as dull and throbbing.  Rates discomfort 5 out of 10 in intensity.  Denies shortness of breath, chest pain, edema, nausea or vomiting.  Has intermittent constipation but has regular bowel movements.  Ambulation seems to make pain worse and rest makes pain better.  Denies trauma or injury.  Takes ibuprofen with minimal relief.     ECOG FS:1 - Symptomatic but completely ambulatory  Review of systems- Review of Systems  Constitutional: Positive for weight loss (Intentional). Negative for chills, fever and malaise/fatigue.  HENT: Negative for congestion and ear pain.   Eyes: Negative.  Negative for blurred vision and double vision.  Respiratory: Negative.  Negative for cough, sputum production and shortness of breath.   Cardiovascular: Negative.  Negative for chest pain, palpitations and leg swelling.  Gastrointestinal: Negative.  Negative for abdominal pain, constipation, diarrhea, nausea and vomiting.  Genitourinary: Negative for dysuria, frequency and urgency.  Musculoskeletal: Positive for back pain, joint pain (Right hip pain) and myalgias. Negative for falls.  Skin: Negative.  Negative for rash.  Neurological: Negative.  Negative for weakness and headaches.  Endo/Heme/Allergies: Negative.  Does not bruise/bleed easily.  Psychiatric/Behavioral: Negative.  Negative for depression. The patient is not nervous/anxious and does not have  insomnia.     Current treatment- Letrozole since June 2017  Allergies  Allergen Reactions  . Penicillins Shortness Of Breath    Has patient had a PCN reaction causing immediate rash, facial/tongue/throat swelling, SOB or  lightheadedness with hypotension: Yes Has patient had a PCN reaction causing severe rash involving mucus membranes or skin necrosis: No Has patient had a PCN reaction that required hospitalization No Has patient had a PCN reaction occurring within the last 10 years: No If all of the above answers are "NO", then may proceed with Cephalosporin use.   . Nickel Hives  . Pravastatin Sodium Other (See Comments)    Myalgia      Past Medical History:  Diagnosis Date  . Anemia   . Cancer ([place]) 09/2015   breast cancer  . GERD (gastroesophageal reflux disease)   . History of chicken pox   . Hyperlipidemia   . Hypertension   . Hypothyroidism   . Neuropathy of foot    bilateral  . PONV (postoperative nausea and vomiting)    exploratory surgery at [facility]  . Thyroid disease   . Tuberculosis exposure    Mother had TB.  Pt shows (+) on tests, but has never had.  . Vertigo    last episode over 1 yr ago  . Wears dentures    partial upper and lower     Past Surgical History:  Procedure Laterality Date  . ABDOMINAL HYSTERECTOMY  07/13/2013   Total. with BSO for postmenopausal bleeding and fibroids with large cervical polyp. Dr. Levy Sjogren and Dr. Glennon Mac at [facility]  . ABDOMINAL ULTRASOUND  04/17/2011   Hepatomegaly with borderline slenomegaly. Suggestive of fatty liver. s/p cholecystectomy. Portions of aorta obscured  . BREAST SURGERY Right 2011   BREAST BIOPSY  . CESAREAN SECTION  1988  . CHOLECYSTECTOMY  1990  . COLONOSCOPY WITH PROPOFOL N/A 09/01/2017   Procedure: COLONOSCOPY WITH PROPOFOL;  Surgeon: [name], MD;  Location: [place];  Service: Gastroenterology;  Laterality: N/A;  . EXPLORATORY LAPAROTOMY     Left breast  . MASTECTOMY W/ SENTINEL NODE BIOPSY Bilateral 10/28/2015   Procedure: MASTECTOMY WITH SENTINEL LYMPH NODE BIOPSY;  Surgeon: Hubbard Robinson, MD;  Location: [facility];  Service: General;  Laterality: Bilateral;  . PORT-A-CATH REMOVAL Left 10/11/2016    Procedure: REMOVAL PORT-A-CATH;  Surgeon: Hubbard Robinson, MD;  Location: [facility];  Service: General;  Laterality: Left;  . PORTACATH PLACEMENT Left 12/23/2015   Procedure: INSERTION PORT-A-CATH;  Surgeon: Hubbard Robinson, MD;  Location: [facility];  Service: General;  Laterality: Left;  . WRIST SURGERY      Social History   Socioeconomic History  . Marital status: Married    Spouse name: Not on file  . Number of children: 2  . Years of education: Not on file  . Highest education level: Not on file  Occupational History  . Not on file  Social Needs  . Financial resource strain: Not on file  . Food insecurity:    Worry: Not on file    Inability: Not on file  . Transportation needs:    Medical: Not on file    Non-medical: Not on file  Tobacco Use  . Smoking status: Never Smoker  . Smokeless tobacco: Never Used  Substance and Sexual Activity  . Alcohol use: No    Alcohol/week: 0.0 oz  . Drug use: No  . Sexual activity: Never    Birth control/protection: Surgical  Lifestyle  . Physical activity:  Days per week: Not on file    Minutes per session: Not on file  . Stress: Not on file  Relationships  . Social connections:    Talks on phone: Not on file    Gets together: Not on file    Attends religious service: Not on file    Active member of club or organization: Not on file    Attends meetings of clubs or organizations: Not on file    Relationship status: Not on file  . Intimate partner violence:    Fear of current or ex partner: Not on file    Emotionally abused: Not on file    Physically abused: Not on file    Forced sexual activity: Not on file  Other Topics Concern  . Not on file  Social History Narrative  . Not on file    Family History  Problem Relation Age of Onset  . Hypertension Mother   . Thyroid disease Mother   . Hypertension Brother   . Diabetes Brother   . Breast cancer Neg Hx   . Ovarian cancer Neg Hx   . Colon cancer Neg Hx       Current Outpatient Medications:  .  atenolol (TENORMIN) 50 MG tablet, Take 1 tablet (50 mg total) by mouth daily., Disp: 90 tablet, Rfl: 4 .  B Complex-C (SUPER B COMPLEX PO), Take 1 tablet by mouth daily., Disp: , Rfl:  .  calcium carbonate (OS-CAL) 600 MG TABS tablet, Take 1 tablet (600 mg total) by mouth 2 (two) times daily with a meal., Disp: 60 tablet, Rfl: 5 .  Cholecalciferol (VITAMIN D3) 2000 units TABS, Take 2,000 Units by mouth daily., Disp: , Rfl:  .  hydrochlorothiazide (HYDRODIURIL) 25 MG tablet, Take 1 tablet (25 mg total) by mouth daily., Disp: 90 tablet, Rfl: 4 .  ibuprofen (ADVIL,MOTRIN) 200 MG tablet, Take 400 mg by mouth every 6 (six) hours as needed for mild pain., Disp: , Rfl:  .  letrozole (FEMARA) 2.5 MG tablet, Take 1 tablet (2.5 mg total) by mouth daily., Disp: 90 tablet, Rfl: 3 .  levothyroxine (SYNTHROID, LEVOTHROID) 75 MCG tablet, Take 1 tablet (75 mcg total) by mouth daily., Disp: 90 tablet, Rfl: 3 .  Loratadine (CLARITIN) 10 MG CAPS, Take by mouth., Disp: , Rfl:  .  lubiprostone (AMITIZA) 8 MCG capsule, Take 1 capsule (8 mcg total) by mouth daily., Disp: 30 capsule, Rfl: 3 .  Magnesium 400 MG TABS, Take 600 mg by mouth daily. , Disp: , Rfl:  .  meclizine (ANTIVERT) 25 MG tablet, TAKE ONE TABLET BY MOUTH EVERY 4 TO 6 HOURS AS NEEDED FOR NAUSEA, Disp: 30 tablet, Rfl: 3 .  Misc Natural Products (TURMERIC CURCUMIN) CAPS, Take 1 capsule by mouth daily., Disp: , Rfl:  .  clotrimazole (LOTRIMIN AF) 1 % cream, Apply 1 application topically 2 (two) times daily. (Patient not taking: Reported on 06/30/2018), Disp: 30 g, Rfl: 0 .  cyclobenzaprine (FLEXERIL) 5 MG tablet, TAKE 1 TABLET(5 MG) BY MOUTH THREE TIMES DAILY AS NEEDED FOR MUSCLE SPASMS (Patient not taking: Reported on 06/30/2018), Disp: 30 tablet, Rfl: 5 .  docusate sodium (COLACE) 100 MG capsule, Take 200 mg by mouth daily. , Disp: , Rfl:  .  ergocalciferol (VITAMIN D2) 50000 units capsule, Take 1 capsule (50,000  Units total) by mouth once a week., Disp: 60 capsule, Rfl: 0 .  hydrocortisone (ANUSOL-HC) 2.5 % rectal cream, Place 1 application rectally 2 (two) times daily. (Patient not taking:  Reported on 06/30/2018), Disp: 30 g, Rfl: 0 .  Hydrocortisone Micronized POWD, RINSE AND SPIT 5ML BY MOUTH EVERY 3 TO 4 HOURS AS NEEDED (Patient not taking: Reported on 06/30/2018), Disp: 1 Bottle, Rfl: 4 .  ketoconazole (NIZORAL) 2 % cream, Apply 1 application topically daily. (Patient not taking: Reported on 06/30/2018), Disp: 50 g, Rfl: 3 No current facility-administered medications for this visit.   Facility-Administered Medications Ordered in Other Visits:  .  sodium chloride flush (NS) 0.9 % injection 10 mL, 10 mL, Intravenous, PRN, Lloyd Huger, MD, 10 mL at 02/09/16 0930 .  sodium chloride flush (NS) 0.9 % injection 10 mL, 10 mL, Intravenous, PRN, Lloyd Huger, MD, 10 mL at 06/21/16 0950  Physical exam:  Vitals:   06/30/18 1000  BP: 139/79  Pulse: (!) 56  Resp: 18  Temp: 98.5 F (36.9 C)  TempSrc: Oral   Physical Exam  Constitutional: She is oriented to person, place, and time. Vital signs are normal. She appears well-developed and well-nourished.  Obese  HENT:  Head: Normocephalic and atraumatic.  Eyes: Pupils are equal, round, and reactive to light.  Neck: Normal range of motion.  Cardiovascular: Normal rate, regular rhythm and normal heart sounds.  No murmur heard. Pulmonary/Chest: Effort normal and breath sounds normal. She has no wheezes.  Abdominal: Soft. Normal appearance and bowel sounds are normal. She exhibits no distension. There is no tenderness.  Musculoskeletal: She exhibits no edema.       Right hip: She exhibits decreased range of motion, decreased strength and tenderness. She exhibits no swelling.  Neurological: She is alert and oriented to person, place, and time.  Skin: Skin is warm and dry. No rash noted.  Psychiatric: Judgment normal.     CMP Latest Ref Rng &  Units 04/04/2017  Glucose 65 - 99 mg/dL 111(H)  BUN 8 - 27 mg/dL 12  Creatinine 0.57 - 1.00 mg/dL 0.84  Sodium 134 - 144 mmol/L 143  Potassium 3.5 - 5.2 mmol/L 4.6  Chloride 96 - 106 mmol/L 99  CO2 18 - 29 mmol/L 27  Calcium 8.7 - 10.3 mg/dL 9.9  Total Protein 6.0 - 8.5 g/dL 7.6  Total Bilirubin 0.0 - 1.2 mg/dL 0.4  Alkaline Phos 39 - 117 IU/L 108  AST 0 - 40 IU/L 27  ALT 0 - 32 IU/L 29   CBC Latest Ref Rng & Units 04/04/2017  WBC 3.4 - 10.8 x10E3/uL 8.1  Hemoglobin 11.1 - 15.9 g/dL 11.6  Hematocrit 34.0 - 46.6 % 34.1  Platelets 150 - 379 x10E3/uL 219    No images are attached to the encounter.  No results found.  Assessment and plan- Patient is a 67 y.o. female who presents with hip pain that radiates to pelvis and down right leg x 3-4 months.   1.  Pathological stage IIb ER PR positive invasive lobular carcinoma of the right upper breast: S/p Bilateral mastectomy. S/p 4 cycles of TC chemotherapy in March 2017.  Completed adjuvant XRT.  Currently on letrozole.  Baseline bone density is normal with plans to repeat in 07/2018.  Currently on surveillance.  2.  Right hip pain/bony pains: Pain reproducible with palpation of right hip. 6/10 pain. Unable to fully bear weight.  Pain radiates to pelvis and down right leg.  She denies injury to this leg.  We will get stat right hip x-ray.  If negative, will get bone scan given her history of breast cancer and several bony pains including lower back, bilateral  lower extremities and right hip. RX 50,000 units of vitamin D weekly.   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 Bony overgrowth along each lateral acetabulum, a finding that may lead to femoral-acetabular syndrome. No acute fracture or dislocation. No erosive change.  Offered steroids but patient would like to hold off at this time d/t possible water retention and weight gain.   Patient would like her DEXA scan moved up to this month.  Rescheduled  DEXA for 07/03/2018.   Visit Diagnosis 1. Right hip pain     Patient expressed understanding and was in agreement with this plan. She also understands that She can call clinic at any time with any questions, concerns, or complaints.   Greater than 50% was spent in counseling and coordination of care with this patient including but not limited to discussion of the relevant topics above (See A&P) including, but not limited to diagnosis and management of acute and chronic medical conditions.    Faythe Casa, AGNP-C [facility] at [place]- [phone] Pager- [phone] 06/30/2018 11:57 AM

## 2018-06-30 NOTE — Telephone Encounter (Signed)
Discussed with Symptom Management Clinic NP's and patient given and accepted appointment for 930 this morning

## 2018-06-30 NOTE — Telephone Encounter (Signed)
Patient called and reports that she is having bony pains and would like to come in to be seen. She is on Letrozole since July 2017 for Breast cancer.

## 2018-07-02 ENCOUNTER — Other Ambulatory Visit: Payer: Self-pay | Admitting: Oncology

## 2018-07-02 MED ORDER — OXYCODONE HCL 5 MG PO TABS: 5.0000 mg | ORAL_TABLET | Freq: Four times a day (QID) | ORAL | 0 refills | Status: AC | PRN

## 2018-07-03 ENCOUNTER — Telehealth: Payer: Self-pay | Admitting: *Deleted

## 2018-07-03 ENCOUNTER — Encounter
Admission: RE | Admit: 2018-07-03 | Discharge: 2018-07-03 | Disposition: A | Discharge status: Home / Self Care | Payer: Medicare Other | Source: Ambulatory Visit | Attending: Oncology | Admitting: Oncology

## 2018-07-03 ENCOUNTER — Ambulatory Visit
Admission: RE | Admit: 2018-07-03 | Discharge: 2018-07-03 | Disposition: A | Discharge status: Home / Self Care | Payer: Medicare Other | Source: Ambulatory Visit | Attending: Oncology | Admitting: Oncology

## 2018-07-03 DIAGNOSIS — Z79811 Long term (current) use of aromatase inhibitors: Secondary | ICD-10-CM | POA: Diagnosis not present

## 2018-07-03 DIAGNOSIS — C50919 Malignant neoplasm of unspecified site of unspecified female breast: Secondary | ICD-10-CM | POA: Diagnosis not present

## 2018-07-03 DIAGNOSIS — Z780 Asymptomatic menopausal state: Secondary | ICD-10-CM | POA: Diagnosis not present

## 2018-07-03 DIAGNOSIS — M25551 Pain in right hip: Secondary | ICD-10-CM | POA: Insufficient documentation

## 2018-07-03 DIAGNOSIS — C50911 Malignant neoplasm of unspecified site of right female breast: Secondary | ICD-10-CM | POA: Diagnosis not present

## 2018-07-03 MED ORDER — TECHNETIUM TC 99M MEDRONATE IV KIT
20.0000 | PACK | Freq: Once | INTRAVENOUS | Status: AC | PRN
  Administered 2018-07-03: 24.07 via INTRAVENOUS

## 2018-07-03 NOTE — Telephone Encounter (Signed)
Per Lorretta Harp, NP have patient take Tylenol with the Oxycodone and get scan done and she will call her as soon as she has the results. Patient informed and asked if she could take 3 ibuprofen instead and I told her yes, but not 3 tabs. She voiced understanding

## 2018-07-03 NOTE — Telephone Encounter (Signed)
Patient having CT this morning and is having pain and is asking for medicine so that she can lie on table. Please advise

## 2018-07-04 ENCOUNTER — Other Ambulatory Visit: Payer: Self-pay | Admitting: Oncology

## 2018-07-04 DIAGNOSIS — M25551 Pain in right hip: Secondary | ICD-10-CM

## 2018-07-04 MED ORDER — MELOXICAM 7.5 MG PO TABS: 7.5000 mg | ORAL_TABLET | Freq: Every day | ORAL | 0 refills | Status: DC

## 2018-07-04 NOTE — Progress Notes (Signed)
Referral placed for orthopedics.  Bone scan, DEXA scan and right hip x-ray completed.  Right hip x-ray revealed moderate symmetric osteoarthritic change in both hip joints.  No acute fracture or dislocation.  Bone density revealed a T score of -0.5 which is a normal finding.  Bone scan did not reveal any suspicious findings for metastatic disease.  It did note mildly increased uptake around the right hip is likely degenerative in nature.   Patient aware of findings.  We will have her follow-up with orthopedic ASAP.  RX Meloxicam (Mobic) 7.5 mg daily. Instructions provided on side effects.   Faythe Casa, NP 07/04/2018 10:26 AM

## 2018-07-07 DIAGNOSIS — M542 Cervicalgia: Secondary | ICD-10-CM | POA: Diagnosis not present

## 2018-07-07 DIAGNOSIS — R200 Anesthesia of skin: Secondary | ICD-10-CM | POA: Diagnosis not present

## 2018-07-07 DIAGNOSIS — M9901 Segmental and somatic dysfunction of cervical region: Secondary | ICD-10-CM | POA: Diagnosis not present

## 2018-07-10 ENCOUNTER — Ambulatory Visit
Admission: RE | Admit: 2018-07-10 | Discharge: 2018-07-10 | Disposition: A | Discharge status: Home / Self Care | Payer: Medicare Other | Source: Ambulatory Visit | Attending: Radiation Oncology | Admitting: Radiation Oncology

## 2018-07-10 ENCOUNTER — Other Ambulatory Visit: Payer: Self-pay

## 2018-07-10 ENCOUNTER — Encounter: Payer: Self-pay | Admitting: Radiation Oncology

## 2018-07-10 VITALS — BP 129/79 | HR 57 | Temp 97.2°F | Resp 16 | Ht 66.0 in | Wt 225.4 lb

## 2018-07-10 DIAGNOSIS — M9901 Segmental and somatic dysfunction of cervical region: Secondary | ICD-10-CM | POA: Diagnosis not present

## 2018-07-10 DIAGNOSIS — C50911 Malignant neoplasm of unspecified site of right female breast: Secondary | ICD-10-CM

## 2018-07-10 DIAGNOSIS — Z923 Personal history of irradiation: Secondary | ICD-10-CM | POA: Diagnosis not present

## 2018-07-10 DIAGNOSIS — Z170 Estrogen receptor positive status [ER+]: Secondary | ICD-10-CM

## 2018-07-10 DIAGNOSIS — Z9012 Acquired absence of left breast and nipple: Secondary | ICD-10-CM | POA: Insufficient documentation

## 2018-07-10 DIAGNOSIS — Z853 Personal history of malignant neoplasm of breast: Secondary | ICD-10-CM | POA: Diagnosis not present

## 2018-07-10 DIAGNOSIS — M542 Cervicalgia: Secondary | ICD-10-CM | POA: Diagnosis not present

## 2018-07-10 DIAGNOSIS — R200 Anesthesia of skin: Secondary | ICD-10-CM | POA: Diagnosis not present

## 2018-07-10 NOTE — Progress Notes (Signed)
Radiation Oncology Follow up Note  Name: Kaylee Reyes   Date:   07/10/2018 MRN:  [id number] DOB: 20-May-1951    This 67 y.o. female presents to the clinic today for to your follow-up status post recent therapy to her right chest wall peripheral lymphatics for stage IIb ER/PR positive invasive lobular carcinoma.Marland Kitchen  REFERRING PROVIDER: Birdie Sons, MD  HPI: Kaylee Reyes is a 67 year old female now seen out 2 years having completed radiation therapy to her right chest wall peripheral lymphatics for stage IIB invasive mammary carcinoma ER/PR positive. She status post bilateral mastectomies. She is seen today in routine follow up is doing well. Has been having some right hip pain although plain films and bone scan were negative to suggest metastatic disease. She specifically denies any new met nodularity or masses in her chest wall cough or bone pain..  COMPLICATIONS OF TREATMENT: none  FOLLOW UP COMPLIANCE: keeps appointments   PHYSICAL EXAM:  BP 129/79 (BP Location: Left Wrist, Patient Position: Sitting, Cuff Size: Normal)   Pulse (!) 57   Temp (!) 97.2 F (36.2 C)   Resp 16   Ht '5\' 6"'$  (1.676 m)   Wt 225 lb 6.7 oz (102.3 kg)   BMI 36.38 kg/m  Patient is status post bilateral mastectomies. Chest walls clear without evidence of mass or nodularity. No evidence of axillary or supraclavicular adenopathy is appreciated. She does have fatty arms do not appreciate any lymphedema.Well-developed well-nourished patient in NAD. HEENT reveals PERLA, EOMI, discs not visualized.  Oral cavity is clear. No oral mucosal lesions are identified. Neck is clear without evidence of cervical or supraclavicular adenopathy. Lungs are clear to A&P. Cardiac examination is essentially unremarkable with regular rate and rhythm without murmur rub or thrill. Abdomen is benign with no organomegaly or masses noted. Motor sensory and DTR levels are equal and symmetric in the upper and lower extremities. Cranial nerves II  through XII are grossly intact. Proprioception is intact. No peripheral adenopathy or edema is identified. No motor or sensory levels are noted. Crude visual fields are within normal range.  RADIOLOGY RESULTS: bone scan and plain films reviewed and compatible above-stated findings  PLAN: present time patient is doing well with no evidence of disease. I'm please were overall progress. I have asked to see her back in 1 year for follow-up. She continues close follow-up care with medical oncology.  I would like to take this opportunity to thank you for allowing me to participate in the care of your patient.Noreene Filbert, MD

## 2018-07-12 DIAGNOSIS — M9901 Segmental and somatic dysfunction of cervical region: Secondary | ICD-10-CM | POA: Diagnosis not present

## 2018-07-12 DIAGNOSIS — R200 Anesthesia of skin: Secondary | ICD-10-CM | POA: Diagnosis not present

## 2018-07-12 DIAGNOSIS — M542 Cervicalgia: Secondary | ICD-10-CM | POA: Diagnosis not present

## 2018-07-13 ENCOUNTER — Telehealth: Payer: Self-pay | Admitting: *Deleted

## 2018-07-13 ENCOUNTER — Other Ambulatory Visit: Payer: Self-pay | Admitting: *Deleted

## 2018-07-13 NOTE — Telephone Encounter (Signed)
I called [facility] and they report that they never received faxed referral. Please fax referral to [facility] [phone]

## 2018-07-13 NOTE — Telephone Encounter (Signed)
Patient requesting that the Ortho referral be changed to Dr Rosalia Hammers at [facility]. She has not gotten an appointment with Dr Marry Guan as of yet

## 2018-07-13 NOTE — Telephone Encounter (Signed)
Referral faxed by Raul Del RN today

## 2018-07-14 ENCOUNTER — Telehealth: Payer: Self-pay | Admitting: Family Medicine

## 2018-07-14 DIAGNOSIS — M542 Cervicalgia: Secondary | ICD-10-CM | POA: Diagnosis not present

## 2018-07-14 DIAGNOSIS — M9901 Segmental and somatic dysfunction of cervical region: Secondary | ICD-10-CM | POA: Diagnosis not present

## 2018-07-14 DIAGNOSIS — M25559 Pain in unspecified hip: Secondary | ICD-10-CM

## 2018-07-14 DIAGNOSIS — R200 Anesthesia of skin: Secondary | ICD-10-CM | POA: Diagnosis not present

## 2018-07-14 NOTE — Telephone Encounter (Signed)
Please advise 

## 2018-07-14 NOTE — Telephone Encounter (Signed)
Pt is having hip pain and she is a patient at the cancer center.  They did a bone scan and seen some disc problems and thinks she needs to see an orthopedic.  She wants to see Dr Rosalia Hammers.   Pt's CB# [phone]  Thanks Con Memos

## 2018-07-14 NOTE — Progress Notes (Deleted)
ANNUAL PREVENTATIVE CARE GYN  ENCOUNTER NOTE  Subjective:       Kaylee Reyes is a 67 y.o. [phone] female here for a routine annual gynecologic exam.  Current complaints: 1.  None  67 year old white female status post abdominal supracervical hysterectomy bilateral salpingo-oophorectomy in 2014, history of cervical fibroid, cervix was maintained to help with vaginal vault support, presents for her annual exam. Patient is status post bilateral mastectomy, chemotherapy, and x-ray therapy for adenocarcinoma of the right breast, finishing all therapies in July 2017. Patient is currently without evidence of disease. Patient is no longer getting mammograms. She has had a transition of care providers with her surgeon having left the area.  Patient has lost 22 pounds in the past year.   Gynecologic History No LMP recorded. Patient has had a hysterectomy. Abdominal supracervical Contraception: hyst Last Pap: 07/2017 neg. Results were: normal Last mammogram: 2016 breast ca-. Results were: abnormal DEXA scan 2017 normal  Obstetric History OB History  Gravida Para Term Preterm AB Living  3 2 2   1 2   SAB TAB Ectopic Multiple Live Births  1       2    # Outcome Date GA Lbr Len/2nd Weight Sex Delivery Anes PTL Lv  3 Term 1988   11 lb 14.4 oz (5.398 kg) F CS-LTranv   LIV  2 SAB 1976          1 Term 1975   9 lb 12.8 oz (4.445 kg) F Vag-Spont   LIV    Past Medical History:  Diagnosis Date  . Anemia   . Cancer ([place]) 09/2015   breast cancer  . GERD (gastroesophageal reflux disease)   . History of chicken pox   . Hyperlipidemia   . Hypertension   . Hypothyroidism   . Neuropathy of foot    bilateral  . PONV (postoperative nausea and vomiting)    explor[facility] surgery at [facility]  . Thyroid disease   . Tuberculosis exposure    Mother had TB.  Pt shows (+) on tests, but has never had.  . Vertigo    last episode over 1 yr ago  . Wears dentures    partial upper and lower    Past Surgical  History:  Procedure Laterality Date  . ABDOMINAL HYSTERECTOMY  07/13/2013   Total. with BSO for postmenopausal bleeding and fibroids with large cervical polyp. Dr. Levy Sjogren and Dr. Glennon Mac at [facility]  . ABDOMINAL ULTRASOUND  04/17/2011   Hepatomegaly with borderline slenomegaly. Suggestive of fatty liver. s/p cholecystectomy. Portions of aorta obscured  . BREAST SURGERY Right 2011   BREAST BIOPSY  . CESAREAN SECTION  1988  . CHOLECYSTECTOMY  1990  . COLONOSCOPY WITH PROPOFOL N/A 09/01/2017   Procedure: COLONOSCOPY WITH PROPOFOL;  Surgeon: [name], MD;  Location: [place];  Service: Gastroenterology;  Laterality: N/A;  . EXPLORATORY LAPAROTOMY     Left breast  . MASTECTOMY W/ SENTINEL NODE BIOPSY Bilateral 10/28/2015   Procedure: MASTECTOMY WITH SENTINEL LYMPH NODE BIOPSY;  Surgeon: Hubbard Robinson, MD;  Location: [facility];  Service: General;  Laterality: Bilateral;  . PORT-A-CATH REMOVAL Left 10/11/2016   Procedure: REMOVAL PORT-A-CATH;  Surgeon: Hubbard Robinson, MD;  Location: [facility];  Service: General;  Laterality: Left;  . PORTACATH PLACEMENT Left 12/23/2015   Procedure: INSERTION PORT-A-CATH;  Surgeon: Hubbard Robinson, MD;  Location: [facility];  Service: General;  Laterality: Left;  . WRIST SURGERY      Current Outpatient Medications on  File Prior to Visit  Medication Sig Dispense Refill  . atenolol (TENORMIN) 50 MG tablet Take 1 tablet (50 mg total) by mouth daily. 90 tablet 4  . B Complex-C (SUPER B COMPLEX PO) Take 1 tablet by mouth daily.    . calcium carbonate (OS-CAL) 600 MG TABS tablet Take 1 tablet (600 mg total) by mouth 2 (two) times daily with a meal. 60 tablet 5  . Cholecalciferol (VITAMIN D3) 2000 units TABS Take 2,000 Units by mouth daily.    . clotrimazole (LOTRIMIN AF) 1 % cream Apply 1 application topically 2 (two) times daily. 30 g 0  . cyclobenzaprine (FLEXERIL) 5 MG tablet TAKE 1 TABLET(5 MG) BY MOUTH THREE TIMES DAILY AS NEEDED FOR MUSCLE  SPASMS 30 tablet 5  . docusate sodium (COLACE) 100 MG capsule Take 200 mg by mouth daily.     . ergocalciferol (VITAMIN D2) 50000 units capsule Take 1 capsule (50,000 Units total) by mouth once a week. 60 capsule 0  . hydrochlorothiazide (HYDRODIURIL) 25 MG tablet Take 1 tablet (25 mg total) by mouth daily. 90 tablet 4  . hydrocortisone (ANUSOL-HC) 2.5 % rectal cream Place 1 application rectally 2 (two) times daily. 30 g 0  . Hydrocortisone Micronized POWD RINSE AND SPIT 5ML BY MOUTH EVERY 3 TO 4 HOURS AS NEEDED 1 Bottle 4  . ibuprofen (ADVIL,MOTRIN) 200 MG tablet Take 400 mg by mouth every 6 (six) hours as needed for mild pain.    Marland Kitchen ketoconazole (NIZORAL) 2 % cream Apply 1 application topically daily. 50 g 3  . letrozole (FEMARA) 2.5 MG tablet Take 1 tablet (2.5 mg total) by mouth daily. 90 tablet 3  . levothyroxine (SYNTHROID, LEVOTHROID) 75 MCG tablet Take 1 tablet (75 mcg total) by mouth daily. 90 tablet 3  . Loratadine (CLARITIN) 10 MG CAPS Take by mouth.    . lubiprostone (AMITIZA) 8 MCG capsule Take 1 capsule (8 mcg total) by mouth daily. 30 capsule 3  . Magnesium 400 MG TABS Take 600 mg by mouth daily.     . meclizine (ANTIVERT) 25 MG tablet TAKE ONE TABLET BY MOUTH EVERY 4 TO 6 HOURS AS NEEDED FOR NAUSEA 30 tablet 3  . meloxicam (MOBIC) 7.5 MG tablet Take 1 tablet (7.5 mg total) by mouth daily. 15 tablet 0  . Misc Natural Products (TURMERIC CURCUMIN) CAPS Take 1 capsule by mouth daily.     Current Facility-Administered Medications on File Prior to Visit  Medication Dose Route Frequency Provider Last Rate Last Dose  . sodium chloride flush (NS) 0.9 % injection 10 mL  10 mL Intravenous PRN Lloyd Huger, MD   10 mL at 02/09/16 0930  . sodium chloride flush (NS) 0.9 % injection 10 mL  10 mL Intravenous PRN Lloyd Huger, MD   10 mL at 06/21/16 0950    Allergies  Allergen Reactions  . Penicillins Shortness Of Breath    Has patient had a PCN reaction causing immediate rash,  facial/tongue/throat swelling, SOB or lightheadedness with hypotension: Yes Has patient had a PCN reaction causing severe rash involving mucus membranes or skin necrosis: No Has patient had a PCN reaction that required hospitalization No Has patient had a PCN reaction occurring within the last 10 years: No If all of the above answers are "NO", then may proceed with Cephalosporin use.   . Nickel Hives  . Pravastatin Sodium Other (See Comments)    Myalgia     Social History   Socioeconomic History  .  Marital status: Married    Spouse name: Not on file  . Number of children: 2  . Years of education: Not on file  . Highest education level: Not on file  Occupational History  . Not on file  Social Needs  . Financial resource strain: Not on file  . Food insecurity:    Worry: Not on file    Inability: Not on file  . Transportation needs:    Medical: Not on file    Non-medical: Not on file  Tobacco Use  . Smoking status: Never Smoker  . Smokeless tobacco: Never Used  Substance and Sexual Activity  . Alcohol use: No    Alcohol/week: 0.0 standard drinks  . Drug use: No  . Sexual activity: Never    Birth control/protection: Surgical  Lifestyle  . Physical activity:    Days per week: Not on file    Minutes per session: Not on file  . Stress: Not on file  Relationships  . Social connections:    Talks on phone: Not on file    Gets together: Not on file    Attends religious service: Not on file    Active member of club or organization: Not on file    Attends meetings of clubs or organizations: Not on file    Relationship status: Not on file  . Intimate partner violence:    Fear of current or ex partner: Not on file    Emotionally abused: Not on file    Physically abused: Not on file    Forced sexual activity: Not on file  Other Topics Concern  . Not on file  Social History Narrative  . Not on file    Family History  Problem Relation Age of Onset  . Hypertension Mother    . Thyroid disease Mother   . Hypertension Brother   . Diabetes Brother   . Breast cancer Neg Hx   . Ovarian cancer Neg Hx   . Colon cancer Neg Hx     The following portions of the patient's history were reviewed and updated as appropriate: allergies, current medications, past family history, past medical history, past social history, past surgical history and problem list.  Review of Systems     Objective:   There were no vitals taken for this visit. CONSTITUTIONAL: Well-developed, well-nourished female in no acute distress.  PSYCHIATRIC: Normal mood and affect. Normal behavior. Normal judgment and thought content. [place]: Alert and oriented to person, place, and time. Normal muscle tone coordination. No cranial nerve deficit noted. HENT:  Normocephalic, atraumatic, External right and left ear normal. Oropharynx is clear and moist EYES: Conjunctivae and EOM are normal.  No scleral icterus.  NECK: Normal range of motion, supple, no masses.  Normal thyroid.  SKIN: Skin is warm and dry. No rash noted. Not diaphoretic. No erythema. No pallor. CARDIOVASCULAR: Normal heart rate noted, regular rhythm, no murmur. RESPIRATORY: Clear to auscultation bilaterally. Effort and breath sounds normal, no problems with respiration noted. BREASTS: Bilateral mastectomy changes are present; significant scarring of chest wall is noted. No area of increased tenderness, no new mass identified ABDOMEN: Soft, normal bowel sounds, no distention noted.  No tenderness, rebound or guarding.  BLADDER: Normal PELVIC:  External Genitalia: Normal  BUS: Normal  Vagina: Atrophic; first-degree cystocele; no enterocele  Cervix: Normal ; no lesions  Uterus: Surgically absent  Adnexa: No palpable masses or tenderness  RV: External Exam NormaI, No Rectal Masses and Normal Sphincter tone  MUSCULOSKELETAL: Normal range  of motion. No tenderness.  No cyanosis, clubbing, or edema.  2+ distal pulses. LYMPHATIC: No  Axillary, Supraclavicular, or Inguinal Adenopathy.    Assessment:   Annual gynecologic examination 67 y.o. Contraception: status post hysterectomy bmi 44 Problem List Items Addressed This Visit    Morbid obesity ([place])   Malignant neoplasm of right breast in female, estrogen receptor positive ([place])   Menopause   Rectocele   Cystocele, midline   Vaginal atrophy    Other Visit Diagnoses    Well woman exam with routine gynecological exam    -  Primary   Status post laparoscopic supracervical hysterectomy       Mixed incontinence          Plan:  Pap: Pap, Reflex if ASCUS Mammogram: Not Indicated Stool Guaiac Testing:  Colonoscopy 09/01/2017  Labs: thru pcp Routine preventative health maintenance measures emphasized: Exercise/Diet/Weight control, Tobacco Warnings, Alcohol/Substance use risks and Stress Management Return to [place], [place]'  Note: This dictation was prepared with Dragon dictation along with smaller phrase technology. Any transcriptional errors that result from this process are unintentional.

## 2018-07-16 ENCOUNTER — Other Ambulatory Visit: Payer: Self-pay | Admitting: Oncology

## 2018-07-17 DIAGNOSIS — M1611 Unilateral primary osteoarthritis, right hip: Secondary | ICD-10-CM | POA: Diagnosis not present

## 2018-07-17 DIAGNOSIS — M25551 Pain in right hip: Secondary | ICD-10-CM | POA: Diagnosis not present

## 2018-07-17 NOTE — Telephone Encounter (Signed)
Patient was advised.  

## 2018-07-17 NOTE — Telephone Encounter (Signed)
Referral ordered. She should here from [place] or orthopedist office in the next couple of days.

## 2018-07-25 DIAGNOSIS — M5441 Lumbago with sciatica, right side: Secondary | ICD-10-CM | POA: Diagnosis not present

## 2018-07-25 DIAGNOSIS — M1611 Unilateral primary osteoarthritis, right hip: Secondary | ICD-10-CM | POA: Diagnosis not present

## 2018-07-25 DIAGNOSIS — G8929 Other chronic pain: Secondary | ICD-10-CM | POA: Diagnosis not present

## 2018-07-26 ENCOUNTER — Other Ambulatory Visit: Payer: Self-pay | Admitting: Sports Medicine

## 2018-07-26 ENCOUNTER — Encounter: Payer: Medicare Other | Admitting: Obstetrics and Gynecology

## 2018-07-26 DIAGNOSIS — M545 Low back pain: Secondary | ICD-10-CM

## 2018-07-27 ENCOUNTER — Other Ambulatory Visit: Payer: Self-pay | Admitting: Sports Medicine

## 2018-07-27 DIAGNOSIS — M545 Low back pain: Secondary | ICD-10-CM

## 2018-07-31 ENCOUNTER — Other Ambulatory Visit: Payer: PRIVATE HEALTH INSURANCE

## 2018-08-10 ENCOUNTER — Ambulatory Visit
Admission: RE | Admit: 2018-08-10 | Discharge: 2018-08-10 | Disposition: A | Discharge status: Home / Self Care | Payer: Medicare Other | Source: Ambulatory Visit | Attending: Sports Medicine | Admitting: Sports Medicine

## 2018-08-10 DIAGNOSIS — M5126 Other intervertebral disc displacement, lumbar region: Secondary | ICD-10-CM | POA: Insufficient documentation

## 2018-08-10 DIAGNOSIS — M545 Low back pain: Secondary | ICD-10-CM

## 2018-08-16 ENCOUNTER — Ambulatory Visit: Payer: PRIVATE HEALTH INSURANCE

## 2018-08-24 ENCOUNTER — Ambulatory Visit (INDEPENDENT_AMBULATORY_CARE_PROVIDER_SITE_OTHER): Payer: Medicare Other | Admitting: Family Medicine

## 2018-08-24 ENCOUNTER — Encounter: Payer: Self-pay | Admitting: Family Medicine

## 2018-08-24 VITALS — BP 134/73 | HR 69 | Temp 98.9°F | Resp 16 | Wt 231.0 lb

## 2018-08-24 DIAGNOSIS — J0110 Acute frontal sinusitis, unspecified: Secondary | ICD-10-CM | POA: Diagnosis not present

## 2018-08-24 MED ORDER — FLUTICASONE PROPIONATE 50 MCG/ACT NA SUSP: 2.0000 | Freq: Every day | NASAL | 6 refills | Status: DC

## 2018-08-24 MED ORDER — AZITHROMYCIN 250 MG PO TABS: ORAL_TABLET | ORAL | 0 refills | Status: DC

## 2018-08-24 NOTE — Progress Notes (Signed)
Patient: Kaylee Reyes Female    DOB: [birth year]   67 y.o.   MRN: [id number] Visit Date: 08/24/2018  Today's Provider: Lelon Huh, MD   Chief Complaint  Patient presents with  . Cough   Subjective:    Cough  This is a new problem. Episode onset: 5 days ago. The problem has been gradually worsening. The cough is productive of sputum. Associated symptoms include headaches (pressure behind her eyes), rhinorrhea and a sore throat. Pertinent negatives include no chest pain, chills, ear congestion, ear pain, fever, hemoptysis, nasal congestion or shortness of breath. Treatments tried: cough drops and Ibuprofen. The treatment provided mild relief.       Allergies  Allergen Reactions  . Penicillins Shortness Of Breath    Has patient had a PCN reaction causing immediate rash, facial/tongue/throat swelling, SOB or lightheadedness with hypotension: Yes Has patient had a PCN reaction causing severe rash involving mucus membranes or skin necrosis: No Has patient had a PCN reaction that required hospitalization No Has patient had a PCN reaction occurring within the last 10 years: No If all of the above answers are "NO", then may proceed with Cephalosporin use.   . Nickel Hives  . Pravastatin Sodium Other (See Comments)    Myalgia      Current Outpatient Medications:  .  atenolol (TENORMIN) 50 MG tablet, Take 1 tablet (50 mg total) by mouth daily., Disp: 90 tablet, Rfl: 4 .  B Complex-C (SUPER B COMPLEX PO), Take 1 tablet by mouth daily., Disp: , Rfl:  .  CALCIUM CITRATE PO, Take 1 tablet by mouth daily., Disp: , Rfl:  .  clotrimazole (LOTRIMIN AF) 1 % cream, Apply 1 application topically 2 (two) times daily., Disp: 30 g, Rfl: 0 .  cyclobenzaprine (FLEXERIL) 5 MG tablet, TAKE 1 TABLET(5 MG) BY MOUTH THREE TIMES DAILY AS NEEDED FOR MUSCLE SPASMS, Disp: 30 tablet, Rfl: 5 .  docusate sodium (COLACE) 100 MG capsule, Take 200 mg by mouth daily. , Disp: , Rfl:  .  ergocalciferol  (VITAMIN D2) 50000 units capsule, Take 1 capsule (50,000 Units total) by mouth once a week., Disp: 60 capsule, Rfl: 0 .  hydrochlorothiazide (HYDRODIURIL) 25 MG tablet, Take 1 tablet (25 mg total) by mouth daily., Disp: 90 tablet, Rfl: 4 .  hydrocortisone (ANUSOL-HC) 2.5 % rectal cream, Place 1 application rectally 2 (two) times daily., Disp: 30 g, Rfl: 0 .  Hydrocortisone Micronized POWD, RINSE AND SPIT 5ML BY MOUTH EVERY 3 TO 4 HOURS AS NEEDED, Disp: 1 Bottle, Rfl: 4 .  ibuprofen (ADVIL,MOTRIN) 200 MG tablet, Take 400 mg by mouth every 6 (six) hours as needed for mild pain., Disp: , Rfl:  .  ketoconazole (NIZORAL) 2 % cream, Apply 1 application topically daily., Disp: 50 g, Rfl: 3 .  letrozole (FEMARA) 2.5 MG tablet, Take 1 tablet (2.5 mg total) by mouth daily., Disp: 90 tablet, Rfl: 3 .  levothyroxine (SYNTHROID, LEVOTHROID) 75 MCG tablet, Take 1 tablet (75 mcg total) by mouth daily., Disp: 90 tablet, Rfl: 3 .  Loratadine (CLARITIN) 10 MG CAPS, Take by mouth., Disp: , Rfl:  .  lubiprostone (AMITIZA) 8 MCG capsule, Take 1 capsule (8 mcg total) by mouth daily., Disp: 30 capsule, Rfl: 3 .  Magnesium 400 MG TABS, Take 600 mg by mouth daily. , Disp: , Rfl:  .  meclizine (ANTIVERT) 25 MG tablet, TAKE ONE TABLET BY MOUTH EVERY 4 TO 6 HOURS AS NEEDED FOR NAUSEA, Disp: 30  tablet, Rfl: 3 .  Misc Natural Products (TURMERIC CURCUMIN) CAPS, Take 1 capsule by mouth daily., Disp: , Rfl:  No current facility-administered medications for this visit.   Facility-Administered Medications Ordered in Other Visits:  .  sodium chloride flush (NS) 0.9 % injection 10 mL, 10 mL, Intravenous, PRN, Lloyd Huger, MD, 10 mL at 02/09/16 0930 .  sodium chloride flush (NS) 0.9 % injection 10 mL, 10 mL, Intravenous, PRN, Lloyd Huger, MD, 10 mL at 06/21/16 0950  Review of Systems  Constitutional: Negative for appetite change, chills, diaphoresis, fatigue and fever.  HENT: Positive for congestion, rhinorrhea,  sneezing and sore throat. Negative for ear pain.   Respiratory: Positive for cough (dry hacking cough that has progressed into prodiuctive cough with yellow mucus). Negative for hemoptysis, chest tightness and shortness of breath.   Cardiovascular: Negative for chest pain and palpitations.  Gastrointestinal: Negative for abdominal pain, nausea and vomiting.  Neurological: Positive for headaches (pressure behind her eyes). Negative for dizziness and weakness.    Social History   Tobacco Use  . Smoking status: Never Smoker  . Smokeless tobacco: Never Used  Substance Use Topics  . Alcohol use: No    Alcohol/week: 0.0 standard drinks   Objective:   BP 134/73 (BP Location: Left Arm, Patient Position: Sitting, Cuff Size: Large)   Pulse 69   Temp 98.9 F (37.2 C) (Oral)   Resp 16   Wt 231 lb (104.8 kg)   SpO2 97% Comment: room air  BMI 37.28 kg/m  Vitals:   08/24/18 0940  BP: 134/73  Pulse: 69  Resp: 16  Temp: 98.9 F (37.2 C)  TempSrc: Oral  SpO2: 97%  Weight: 231 lb (104.8 kg)     Physical Exam  General Appearance:    Alert, cooperative, no distress  HENT:   bilateral TM normal without fluid or infection, neck without nodes, neck has bilateral anterior cervical nodes enlarged, throat normal without erythema or exudate, frontal sinus tender and nasal mucosa pale and congested  Eyes:    PERRL, conjunctiva/corneas clear, EOM's intact       Lungs:     Clear to auscultation bilaterally, respirations unlabored  Heart:    Regular rate and rhythm  Neurologic:   Awake, alert, oriented x 3. No apparent focal neurological           defect.           Assessment & Plan:     1. Acute non-recurrent frontal sinusitis  - azithromycin (ZITHROMAX) 250 MG tablet; 2 by mouth today, then 1 daily for 4 days  Dispense: 6 tablet; Refill: 0 - fluticasone (FLONASE) 50 MCG/ACT nasal spray; Place 2 sprays into both nostrils daily.  Dispense: 16 g; Refill: 6       Lelon Huh, MD    [facility]

## 2018-09-15 ENCOUNTER — Other Ambulatory Visit: Payer: Self-pay | Admitting: Family Medicine

## 2018-09-15 MED ORDER — ATENOLOL 50 MG PO TABS: 50.0000 mg | ORAL_TABLET | Freq: Every day | ORAL | 0 refills | Status: DC

## 2018-09-15 NOTE — Telephone Encounter (Signed)
Pt contacted office for refill request on the following medications:  atenolol (TENORMIN) 50 MG tablet  [facility]  1 Week Supply  Pt is requesting a 7 day supply be sent to local pharmacy to last until her mail order pharmacy sends her medication. Please advise. Thanks TNP

## 2018-09-17 NOTE — Progress Notes (Signed)
[place]  [phone] [phone]  ID: Jaquita Folds OB: 01/30/1951  MR#: [id number]  CBS#:[id number]  Patient Care Team: Birdie Sons, MD as PCP - General (Family Medicine) [name], MD as Consulting Physician (Gastroenterology) Hubbard Robinson, MD as Consulting Physician (Surgery)  CHIEF COMPLAINT: Pathologic stage IIB ER/PR positive invasive lobular carcinoma of the right upper breast.  INTERVAL HISTORY: Patient returns to clinic today for routine six-month evaluation.  She continues to have intentional weight loss secondary to diet.  She continues to tolerate letrozole well without significant side effects.  She continues to have a mild peripheral neuropathy in her feet, but denies any other neurologic complaints. She denies any recent fevers or illnesses. She denies any chest pain or shortness of breath.  She has no nausea, vomiting, constipation, or diarrhea. She has no urinary complaints.  Patient offers no further specific complaints today.  REVIEW OF SYSTEMS:   Review of Systems  Constitutional: Positive for weight loss. Negative for fever and malaise/fatigue.  Respiratory: Negative for cough and shortness of breath.   Cardiovascular: Negative.  Negative for chest pain and leg swelling.  Gastrointestinal: Negative.  Negative for abdominal pain and constipation.  Genitourinary: Negative.  Negative for dysuria.  Musculoskeletal: Negative.  Negative for back pain and joint pain.  Skin: Negative.  Negative for rash.  Neurological: Positive for sensory change. Negative for weakness.  Psychiatric/Behavioral: Negative.  Negative for memory loss. The patient is not nervous/anxious.     As per HPI. Otherwise, a complete review of systems is negative.  PAST MEDICAL HISTORY: Past Medical History:  Diagnosis Date  . Anemia   . Cancer ([place]) 09/2015   breast cancer  . GERD (gastroesophageal reflux disease)   . History of chicken  pox   . Hyperlipidemia   . Hypertension   . Hypothyroidism   . Neuropathy of foot    bilateral  . PONV (postoperative nausea and vomiting)    exploratory surgery at [facility]  . Thyroid disease   . Tuberculosis exposure    Mother had TB.  Pt shows (+) on tests, but has never had.  . Vertigo    last episode over 1 yr ago  . Wears dentures    partial upper and lower    PAST SURGICAL HISTORY: Past Surgical History:  Procedure Laterality Date  . ABDOMINAL HYSTERECTOMY  07/13/2013   Total. with BSO for postmenopausal bleeding and fibroids with large cervical polyp. Dr. Levy Sjogren and Dr. Glennon Mac at [facility]  . ABDOMINAL ULTRASOUND  04/17/2011   Hepatomegaly with borderline slenomegaly. Suggestive of fatty liver. s/p cholecystectomy. Portions of aorta obscured  . BREAST SURGERY Right 2011   BREAST BIOPSY  . CESAREAN SECTION  1988  . CHOLECYSTECTOMY  1990  . COLONOSCOPY WITH PROPOFOL N/A 09/01/2017   Procedure: COLONOSCOPY WITH PROPOFOL;  Surgeon: [name], MD;  Location: [place];  Service: Gastroenterology;  Laterality: N/A;  . EXPLORATORY LAPAROTOMY     Left breast  . MASTECTOMY W/ SENTINEL NODE BIOPSY Bilateral 10/28/2015   Procedure: MASTECTOMY WITH SENTINEL LYMPH NODE BIOPSY;  Surgeon: Hubbard Robinson, MD;  Location: [facility];  Service: General;  Laterality: Bilateral;  . PORT-A-CATH REMOVAL Left 10/11/2016   Procedure: REMOVAL PORT-A-CATH;  Surgeon: Hubbard Robinson, MD;  Location: [facility];  Service: General;  Laterality: Left;  . PORTACATH PLACEMENT Left 12/23/2015   Procedure: INSERTION PORT-A-CATH;  Surgeon: Hubbard Robinson, MD;  Location: [facility];  Service: General;  Laterality: Left;  .  WRIST SURGERY      FAMILY HISTORY Family History  Problem Relation Age of Onset  . Hypertension Mother   . Thyroid disease Mother   . Hypertension Brother   . Diabetes Brother   . Breast cancer Neg Hx   . Ovarian cancer Neg Hx   . Colon cancer Neg Hx        ADVANCED  DIRECTIVES:    HEALTH MAINTENANCE: Social History   Tobacco Use  . Smoking status: Never Smoker  . Smokeless tobacco: Never Used  Substance Use Topics  . Alcohol use: No    Alcohol/week: 0.0 standard drinks  . Drug use: No     Colonoscopy:  PAP:  Bone density:  Lipid panel:  Allergies  Allergen Reactions  . Penicillins Shortness Of Breath    Has patient had a PCN reaction causing immediate rash, facial/tongue/throat swelling, SOB or lightheadedness with hypotension: Yes Has patient had a PCN reaction causing severe rash involving mucus membranes or skin necrosis: No Has patient had a PCN reaction that required hospitalization No Has patient had a PCN reaction occurring within the last 10 years: No If all of the above answers are "NO", then may proceed with Cephalosporin use.   . Nickel Hives  . Pravastatin Sodium Other (See Comments)    Myalgia     Current Outpatient Medications  Medication Sig Dispense Refill  . atenolol (TENORMIN) 50 MG tablet Take 1 tablet (50 mg total) by mouth daily. 7 tablet 0  . B Complex-C (SUPER B COMPLEX PO) Take 1 tablet by mouth daily.    Marland Kitchen CALCIUM CITRATE PO Take 1 tablet by mouth daily.    Marland Kitchen docusate sodium (COLACE) 100 MG capsule Take 200 mg by mouth daily.     . ergocalciferol (VITAMIN D2) 50000 units capsule Take 1 capsule (50,000 Units total) by mouth once a week. 60 capsule 0  . hydrochlorothiazide (HYDRODIURIL) 25 MG tablet Take 1 tablet (25 mg total) by mouth daily. 90 tablet 4  . ketoconazole (NIZORAL) 2 % cream Apply 1 application topically daily. 50 g 3  . letrozole (FEMARA) 2.5 MG tablet Take 1 tablet (2.5 mg total) by mouth daily. 90 tablet 3  . levothyroxine (SYNTHROID, LEVOTHROID) 75 MCG tablet Take 1 tablet (75 mcg total) by mouth daily. 90 tablet 3  . Loratadine (CLARITIN) 10 MG CAPS Take by mouth.    . Magnesium 400 MG TABS Take 600 mg by mouth daily.     . Misc Natural Products (TURMERIC CURCUMIN) CAPS Take 1 capsule by  mouth daily.    . clotrimazole (LOTRIMIN AF) 1 % cream Apply 1 application topically 2 (two) times daily. (Patient not taking: Reported on 09/20/2018) 30 g 0  . cyclobenzaprine (FLEXERIL) 5 MG tablet TAKE 1 TABLET(5 MG) BY MOUTH THREE TIMES DAILY AS NEEDED FOR MUSCLE SPASMS (Patient not taking: Reported on 09/20/2018) 30 tablet 5  . hydrocortisone (ANUSOL-HC) 2.5 % rectal cream Place 1 application rectally 2 (two) times daily. (Patient not taking: Reported on 09/20/2018) 30 g 0  . Hydrocortisone Micronized POWD RINSE AND SPIT 5ML BY MOUTH EVERY 3 TO 4 HOURS AS NEEDED (Patient not taking: Reported on 09/20/2018) 1 Bottle 4  . ibuprofen (ADVIL,MOTRIN) 200 MG tablet Take 400 mg by mouth every 6 (six) hours as needed for mild pain.    . meclizine (ANTIVERT) 25 MG tablet TAKE ONE TABLET BY MOUTH EVERY 4 TO 6 HOURS AS NEEDED FOR NAUSEA (Patient not taking: Reported on 09/20/2018) 30 tablet  3   No current facility-administered medications for this visit.    Facility-Administered Medications Ordered in Other Visits  Medication Dose Route Frequency Provider Last Rate Last Dose  . sodium chloride flush (NS) 0.9 % injection 10 mL  10 mL Intravenous PRN Lloyd Huger, MD   10 mL at 02/09/16 0930  . sodium chloride flush (NS) 0.9 % injection 10 mL  10 mL Intravenous PRN Lloyd Huger, MD   10 mL at 06/21/16 0950    OBJECTIVE: Vitals:   09/20/18 1454  BP: (!) 165/74  Pulse: (!) 53  Resp: 18  Temp: (!) 96.1 F (35.6 C)     Body mass index is 36.09 kg/m.    ECOG FS:0 - Asymptomatic  General: Well-developed, well-nourished, no acute distress. Eyes: Pink conjunctiva, anicteric sclera. HEENT: Normocephalic, moist mucous membranes. Breast: Bilateral mastectomy.  Exam deferred today. Lungs: Clear to auscultation bilaterally. Heart: Regular rate and rhythm. No rubs, murmurs, or gallops. Abdomen: Soft, nontender, nondistended. No organomegaly noted, normoactive bowel sounds. Musculoskeletal:  No edema, cyanosis, or clubbing. Neuro: Alert, answering all questions appropriately. Cranial nerves grossly intact. Skin: No rashes or petechiae noted. Psych: Normal affect.  LAB RESULTS:  Lab Results  Component Value Date   NA 143 04/04/2017   K 4.6 04/04/2017   CL 99 04/04/2017   CO2 27 04/04/2017   GLUCOSE 111 (H) 04/04/2017   BUN 12 04/04/2017   CREATININE 0.84 04/04/2017   CALCIUM 9.9 04/04/2017   PROT 7.6 04/04/2017   ALBUMIN 4.2 04/04/2017   AST 27 04/04/2017   ALT 29 04/04/2017   ALKPHOS 108 04/04/2017   BILITOT 0.4 04/04/2017   GFRNONAA 74 04/04/2017   GFRAA 85 04/04/2017    Lab Results  Component Value Date   WBC 8.1 04/04/2017   NEUTROABS 6.4 04/04/2017   HGB 11.6 04/04/2017   HCT 34.1 04/04/2017   MCV 83 04/04/2017   PLT 219 04/04/2017   Lab Results  Component Value Date   IRON 48 02/09/2016   TIBC 326 02/09/2016   IRONPCTSAT 15 02/09/2016     STUDIES: No results found.  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  PLAN:    1. Pathologic stage IIB ER/PR positive invasive lobular carcinoma of the right upper breast: Patient is now status post bilateral mastectomy, therefore does not require additional mammograms. She completed 4 cycles of Taxotere and Cytoxan on March 03, 2016.  She also completed adjuvant XRT.  Continue letrozole for a total of 5 years completing in July 2022.  Repeat bone marrow density on July 03, 2018 reported T score of 0.5 which is considered within normal range and improved from 2 years prior.  Repeat in 2021.  Return to clinic in 6 months for routine evaluation.   2.  Weight loss: Intentional.  I spent a total of 20 minutes face-to-face with the patient of which greater than 50% of the visit was spent in counseling and coordination of care as detailed above.   Patient expressed understanding and was in agreement with this plan. She also understands that She can call  clinic at any time with any questions, concerns, or complaints.   Invasive lobular carcinoma of right breast, stage 2 ([place])   Staging form: Breast, AJCC 7th Edition     Pathologic stage: Stage IIB (T2, N1a, M0) - Signed by Lloyd Huger, MD on 10/22/2015   Lloyd Huger, MD   09/22/2018 12:35 PM

## 2018-09-20 ENCOUNTER — Inpatient Hospital Stay: Payer: Medicare Other | Attending: Oncology | Admitting: Oncology

## 2018-09-20 ENCOUNTER — Other Ambulatory Visit: Payer: Self-pay

## 2018-09-20 VITALS — BP 165/74 | HR 53 | Temp 96.1°F | Resp 18 | Wt 223.6 lb

## 2018-09-20 DIAGNOSIS — Z9221 Personal history of antineoplastic chemotherapy: Secondary | ICD-10-CM | POA: Insufficient documentation

## 2018-09-20 DIAGNOSIS — Z923 Personal history of irradiation: Secondary | ICD-10-CM | POA: Diagnosis not present

## 2018-09-20 DIAGNOSIS — Z79811 Long term (current) use of aromatase inhibitors: Secondary | ICD-10-CM | POA: Diagnosis not present

## 2018-09-20 DIAGNOSIS — C50911 Malignant neoplasm of unspecified site of right female breast: Secondary | ICD-10-CM | POA: Diagnosis not present

## 2018-09-20 DIAGNOSIS — Z170 Estrogen receptor positive status [ER+]: Secondary | ICD-10-CM | POA: Diagnosis not present

## 2018-09-20 DIAGNOSIS — Z9013 Acquired absence of bilateral breasts and nipples: Secondary | ICD-10-CM | POA: Diagnosis not present

## 2018-09-20 NOTE — Progress Notes (Signed)
Here for follow up. Per pt feeling " good "  Stated having neuropathy in feet

## 2018-10-11 ENCOUNTER — Encounter: Payer: Self-pay | Admitting: Obstetrics and Gynecology

## 2018-10-11 ENCOUNTER — Ambulatory Visit (INDEPENDENT_AMBULATORY_CARE_PROVIDER_SITE_OTHER): Payer: Medicare Other | Admitting: Obstetrics and Gynecology

## 2018-10-11 VITALS — BP 110/73 | HR 55 | Ht 66.0 in | Wt 222.9 lb

## 2018-10-11 DIAGNOSIS — Z6835 Body mass index (BMI) 35.0-35.9, adult: Secondary | ICD-10-CM

## 2018-10-11 DIAGNOSIS — Z9013 Acquired absence of bilateral breasts and nipples: Secondary | ICD-10-CM

## 2018-10-11 DIAGNOSIS — Z01419 Encounter for gynecological examination (general) (routine) without abnormal findings: Secondary | ICD-10-CM

## 2018-10-11 DIAGNOSIS — N816 Rectocele: Secondary | ICD-10-CM

## 2018-10-11 DIAGNOSIS — Z780 Asymptomatic menopausal state: Secondary | ICD-10-CM

## 2018-10-11 DIAGNOSIS — N952 Postmenopausal atrophic vaginitis: Secondary | ICD-10-CM

## 2018-10-11 DIAGNOSIS — N8111 Cystocele, midline: Secondary | ICD-10-CM

## 2018-10-11 NOTE — Progress Notes (Signed)
ANNUAL PREVENTATIVE CARE GYN  ENCOUNTER NOTE  Subjective:       Kaylee Reyes is a 67 y.o. [phone] female here for a routine annual gynecologic exam.  Current complaints: 1.  None  67 year old white female status post abdominal supracervical hysterectomy bilateral salpingo-oophorectomy in 2014, history of cervical fibroid, cervix was maintained to help with vaginal vault support, presents for her annual exam. Patient is status post bilateral mastectomy, chemotherapy, and x-ray therapy for adenocarcinoma of the right breast, finishing all therapies in July 2017. Patient is currently without evidence of disease. Patient is no longer getting mammograms. She has had a transition of care providers with her surgeon having left the area this past year.  Patient has lost 55 pounds in the past year. No major interval health issues except for some low back pain secondary to degenerative disc disease.  She is taking calcium with vitamin D along with magnesium.  Bowel function is normal; she did have colonoscopy this past year which was normal.  Bladder function is normal except for some occasional stress urinary incontinence.   Gynecologic History No LMP recorded. Patient has had a hysterectomy. Abdominal supracervical Contraception: hyst Last Pap: 07/2017 neg . Results were: normal Last mammogram: 2016 breast ca-. Results were: abnormal Status post mastectomy, bilateral; no further mammograms indicated DEXA scan 2017 normal  Obstetric History OB History  Gravida Para Term Preterm AB Living  3 2 2   1 2   SAB TAB Ectopic Multiple Live Births  1       2    # Outcome Date GA Lbr Len/2nd Weight Sex Delivery Anes PTL Lv  3 Term 1988   11 lb 14.4 oz (5.398 kg) F CS-LTranv   LIV  2 SAB 1976          1 Term 1975   9 lb 12.8 oz (4.445 kg) F Vag-Spont   LIV    Past Medical History:  Diagnosis Date  . Anemia   . Cancer ([place]) 09/2015   breast cancer  . GERD (gastroesophageal reflux disease)   .  History of chicken pox   . Hyperlipidemia   . Hypertension   . Hypothyroidism   . Neuropathy of foot    bilateral  . PONV (postoperative nausea and vomiting)    exploratory surgery at [facility]  . Thyroid disease   . Tuberculosis exposure    Mother had TB.  Pt shows (+) on tests, but has never had.  . Vertigo    last episode over 1 yr ago  . Wears dentures    partial upper and lower    Past Surgical History:  Procedure Laterality Date  . ABDOMINAL HYSTERECTOMY  07/13/2013   Total. with BSO for postmenopausal bleeding and fibroids with large cervical polyp. Dr. Levy Sjogren and Dr. Glennon Mac at [facility] [place]  . ABDOMINAL ULTRASOUND  04/17/2011   Hepatomegaly with borderline slenomegaly. Suggestive of fatty liver. s/p cholecystectomy. Portions of aorta obscured  . BREAST SURGERY Right 2011   BREAST BIOPSY  . CESAREAN SECTION  1988  . CHOLECYSTECTOMY  1990  . COLONOSCOPY WITH PROPOFOL N/A 09/01/2017   Procedure: COLONOSCOPY WITH PROPOFOL;  Surgeon: [name], MD;  Location: [place];  Service: Gastroenterology;  Laterality: N/A;  . EXPLORATORY LAPAROTOMY     Left breast  . MASTECTOMY W/ SENTINEL NODE BIOPSY Bilateral 10/28/2015   Procedure: MASTECTOMY WITH SENTINEL LYMPH NODE BIOPSY;  Surgeon: Hubbard Robinson, MD;  Location: [facility];  Service: General;  Laterality: Bilateral;  .  PORT-A-CATH REMOVAL Left 10/11/2016   Procedure: REMOVAL PORT-A-CATH;  Surgeon: Hubbard Robinson, MD;  Location: [facility];  Service: General;  Laterality: Left;  . PORTACATH PLACEMENT Left 12/23/2015   Procedure: INSERTION PORT-A-CATH;  Surgeon: Hubbard Robinson, MD;  Location: [facility];  Service: General;  Laterality: Left;  . WRIST SURGERY      Current Outpatient Medications on File Prior to Visit  Medication Sig Dispense Refill  . atenolol (TENORMIN) 50 MG tablet Take 1 tablet (50 mg total) by mouth daily. 7 tablet 0  . B Complex-C (SUPER B COMPLEX PO) Take 1 tablet by mouth daily.    Marland Kitchen CALCIUM  CITRATE PO Take 1 tablet by mouth daily.    Marland Kitchen docusate sodium (COLACE) 100 MG capsule Take 200 mg by mouth daily.     . ergocalciferol (VITAMIN D2) 50000 units capsule Take 1 capsule (50,000 Units total) by mouth once a week. 60 capsule 0  . hydrochlorothiazide (HYDRODIURIL) 25 MG tablet Take 1 tablet (25 mg total) by mouth daily. 90 tablet 4  . ibuprofen (ADVIL,MOTRIN) 200 MG tablet Take 400 mg by mouth every 6 (six) hours as needed for mild pain.    Marland Kitchen letrozole (FEMARA) 2.5 MG tablet Take 1 tablet (2.5 mg total) by mouth daily. 90 tablet 3  . levothyroxine (SYNTHROID, LEVOTHROID) 75 MCG tablet Take 1 tablet (75 mcg total) by mouth daily. 90 tablet 3  . Loratadine (CLARITIN) 10 MG CAPS Take by mouth.    . Magnesium 400 MG TABS Take 600 mg by mouth daily.     . meclizine (ANTIVERT) 12.5 MG tablet Take 12.5 mg by mouth 3 (three) times daily as needed for dizziness.    . Misc Natural Products (TURMERIC CURCUMIN) CAPS Take 1 capsule by mouth daily.    Marland Kitchen ketoconazole (NIZORAL) 2 % cream Apply 1 application topically daily. 50 g 3   Current Facility-Administered Medications on File Prior to Visit  Medication Dose Route Frequency Provider Last Rate Last Dose  . sodium chloride flush (NS) 0.9 % injection 10 mL  10 mL Intravenous PRN Lloyd Huger, MD   10 mL at 02/09/16 0930  . sodium chloride flush (NS) 0.9 % injection 10 mL  10 mL Intravenous PRN Lloyd Huger, MD   10 mL at 06/21/16 0950    Allergies  Allergen Reactions  . Penicillins Shortness Of Breath    Has patient had a PCN reaction causing immediate rash, facial/tongue/throat swelling, SOB or lightheadedness with hypotension: Yes Has patient had a PCN reaction causing severe rash involving mucus membranes or skin necrosis: No Has patient had a PCN reaction that required hospitalization No Has patient had a PCN reaction occurring within the last 10 years: No If all of the above answers are "NO", then may proceed with  Cephalosporin use.   . Nickel Hives  . Pravastatin Sodium Other (See Comments)    Myalgia     Social History   Socioeconomic History  . Marital status: Married    Spouse name: Not on file  . Number of children: 2  . Years of education: Not on file  . Highest education level: Not on file  Occupational History  . Not on file  Social Needs  . Financial resource strain: Not on file  . Food insecurity:    Worry: Not on file    Inability: Not on file  . Transportation needs:    Medical: Not on file    Non-medical: Not on file  Tobacco Use  . Smoking status: Never Smoker  . Smokeless tobacco: Never Used  Substance and Sexual Activity  . Alcohol use: No    Alcohol/week: 0.0 standard drinks  . Drug use: No  . Sexual activity: Never    Birth control/protection: Surgical  Lifestyle  . Physical activity:    Days per week: Not on file    Minutes per session: Not on file  . Stress: Not on file  Relationships  . Social connections:    Talks on phone: Not on file    Gets together: Not on file    Attends religious service: Not on file    Active member of club or organization: Not on file    Attends meetings of clubs or organizations: Not on file    Relationship status: Not on file  . Intimate partner violence:    Fear of current or ex partner: Not on file    Emotionally abused: Not on file    Physically abused: Not on file    Forced sexual activity: Not on file  Other Topics Concern  . Not on file  Social History Narrative  . Not on file    Family History  Problem Relation Age of Onset  . Hypertension Mother   . Thyroid disease Mother   . Hypertension Brother   . Diabetes Brother   . Breast cancer Neg Hx   . Ovarian cancer Neg Hx   . Colon cancer Neg Hx     The following portions of the patient's history were reviewed and updated as appropriate: allergies, current medications, past family history, past medical history, past social history, past surgical history  and problem list.  Review of Systems Review of Systems  Constitutional: Negative.   HENT: Negative.   Eyes: Negative.   Respiratory: Negative.   Cardiovascular: Negative.   Gastrointestinal: Negative.   Genitourinary:       Stress incontinence  Musculoskeletal: Positive for back pain.  Skin: Negative.   Neurological: Negative.   Endo/Heme/Allergies: Negative.   Psychiatric/Behavioral: Negative.        Objective:   BP 110/73   Pulse (!) 55   Ht 5\' 6"  (1.676 m)   Wt 222 lb 14.4 oz (101.1 kg)   BMI 35.98 kg/m  CONSTITUTIONAL: Well-developed, well-nourished female in no acute distress.  PSYCHIATRIC: Normal mood and affect. Normal behavior. Normal judgment and thought content. [place]: Alert and oriented to person, place, and time. Normal muscle tone coordination. No cranial nerve deficit noted. HENT:  Normocephalic, atraumatic, External right and left ear normal.  EYES: Conjunctivae and EOM are normal.  No scleral icterus.  NECK: Normal range of motion, supple, no masses.  Normal thyroid.  SKIN: Skin is warm and dry. No rash noted. Not diaphoretic. No erythema. No pallor. CARDIOVASCULAR: Normal heart rate noted, regular rhythm, no murmur. RESPIRATORY: Clear to auscultation bilaterally. Effort and breath sounds normal, no problems with respiration noted. BREASTS: Bilateral mastectomy changes are present; significant scarring of chest wall is noted. No area of increased tenderness, no new mass identified (unchanged) ABDOMEN: Soft, no distention noted.  No tenderness, rebound or guarding.  BLADDER: Normal PELVIC:  External Genitalia: Normal  BUS: Normal  Vagina: Atrophic; first-degree cystocele; no enterocele  Cervix: Normal ; no lesions  Uterus: Surgically absent  Adnexa: No palpable masses or tenderness  RV: External Exam NormaI, No Rectal Masses and Normal Sphincter tone  MUSCULOSKELETAL: Normal range of motion. No tenderness.  No cyanosis, clubbing, or edema.  2+  distal pulses. LYMPHATIC: No Axillary, Supraclavicular, or Inguinal Adenopathy.    Assessment:   Annual gynecologic examination 67 y.o. 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  I have reviewed, updated, and concur with the information scribed by Joyice Faster, CMA Brayton Mars, MD   Note: This dictation was prepared with Dragon dictation along with smaller phrase technology. Any transcriptional errors that result from this process are unintentional.

## 2018-10-11 NOTE — Patient Instructions (Addendum)
1.  Pap smear is not done.  Next Pap smear is due 2020. 2.  Mammograms not indicated. 3.  Screening labs are to be obtained through primary care 4.  Stool guaiac cards are not given due to colonoscopy being completed this year. 5.  Continue with calcium and vitamin D supplementation.  Calcium is 600 mg twice daily.  Vitamin D is 50,000 international units weekly 6.  Return in 1 year for annual exam-Dr. [place] Maintenance for Postmenopausal Women Menopause is a normal process in which your reproductive ability comes to an end. This process happens gradually over a span of months to years, usually between the ages of 41 and 37. Menopause is complete when you have missed 12 consecutive menstrual periods. It is important to talk with your health care provider about some of the most common conditions that affect postmenopausal women, such as heart disease, cancer, and bone loss (osteoporosis). Adopting a healthy lifestyle and getting preventive care can help to promote your health and wellness. Those actions can also lower your chances of developing some of these common conditions. What should I know about menopause? During menopause, you may experience a number of symptoms, such as:  Moderate-to-severe hot flashes.  Night sweats.  Decrease in sex drive.  Mood swings.  Headaches.  Tiredness.  Irritability.  Memory problems.  Insomnia.  Choosing to treat or not to treat menopausal changes is an individual decision that you make with your health care provider. What should I know about hormone replacement therapy and supplements? Hormone therapy products are effective for treating symptoms that are associated with menopause, such as hot flashes and night sweats. Hormone replacement carries certain risks, especially as you become older. If you are thinking about using estrogen or estrogen with progestin treatments, discuss the benefits and risks with your health care provider. What  should I know about heart disease and stroke? Heart disease, heart attack, and stroke become more likely as you age. This may be due, in part, to the hormonal changes that your body experiences during menopause. These can affect how your body processes dietary fats, triglycerides, and cholesterol. Heart attack and stroke are both medical emergencies. There are many things that you can do to help prevent heart disease and stroke:  Have your blood pressure checked at least every 1-2 years. High blood pressure causes heart disease and increases the risk of stroke.  If you are 32-75 years old, ask your health care provider if you should take aspirin to prevent a heart attack or a stroke.  Do not use any tobacco products, including cigarettes, chewing tobacco, or electronic cigarettes. If you need help quitting, ask your health care provider.  It is important to eat a healthy diet and maintain a healthy weight. ? Be sure to include plenty of vegetables, fruits, low-fat dairy products, and lean protein. ? Avoid eating foods that are high in solid fats, added sugars, or salt (sodium).  Get regular exercise. This is one of the most important things that you can do for your health. ? Try to exercise for at least 150 minutes each week. The type of exercise that you do should increase your heart rate and make you sweat. This is known as moderate-intensity exercise. ? Try to do strengthening exercises at least twice each week. Do these in addition to the moderate-intensity exercise.  Know your numbers.Ask your health care provider to check your cholesterol and your blood glucose. Continue to have your blood tested as  directed by your health care provider.  What should I know about cancer screening? There are several types of cancer. Take the following steps to reduce your risk and to catch any cancer development as early as possible. Breast Cancer  Practice breast self-awareness. ? This means  understanding how your breasts normally appear and feel. ? It also means doing regular breast self-exams. Let your health care provider know about any changes, no matter how small.  If you are 50 or older, have a clinician do a breast exam (clinical breast exam or CBE) every year. Depending on your age, family history, and medical history, it may be recommended that you also have a yearly breast X-ray (mammogram).  If you have a family history of breast cancer, talk with your health care provider about genetic screening.  If you are at high risk for breast cancer, talk with your health care provider about having an MRI and a mammogram every year.  Breast cancer (BRCA) gene test is recommended for women who have family members with BRCA-related cancers. Results of the assessment will determine the need for genetic counseling and BRCA1 and for BRCA2 testing. BRCA-related cancers include these types: ? Breast. This occurs in males or females. ? Ovarian. ? Tubal. This may also be called fallopian tube cancer. ? Cancer of the abdominal or pelvic lining (peritoneal cancer). ? Prostate. ? Pancreatic.  Cervical, Uterine, and Ovarian Cancer Your health care provider may recommend that you be screened regularly for cancer of the pelvic organs. These include your ovaries, uterus, and vagina. This screening involves a pelvic exam, which includes checking for microscopic changes to the surface of your cervix (Pap test).  For women ages 21-65, health care providers may recommend a pelvic exam and a Pap test every three years. For women ages 81-65, they may recommend the Pap test and pelvic exam, combined with testing for human papilloma virus (HPV), every five years. Some types of HPV increase your risk of cervical cancer. Testing for HPV may also be done on women of any age who have unclear Pap test results.  Other health care providers may not recommend any screening for nonpregnant women who are  considered low risk for pelvic cancer and have no symptoms. Ask your health care provider if a screening pelvic exam is right for you.  If you have had past treatment for cervical cancer or a condition that could lead to cancer, you need Pap tests and screening for cancer for at least 20 years after your treatment. If Pap tests have been discontinued for you, your risk factors (such as having a new sexual partner) need to be reassessed to determine if you should start having screenings again. Some women have medical problems that increase the chance of getting cervical cancer. In these cases, your health care provider may recommend that you have screening and Pap tests more often.  If you have a family history of uterine cancer or ovarian cancer, talk with your health care provider about genetic screening.  If you have vaginal bleeding after reaching menopause, tell your health care provider.  There are currently no reliable tests available to screen for ovarian cancer.  Lung Cancer Lung cancer screening is recommended for adults 72-62 years old who are at high risk for lung cancer because of a history of smoking. A yearly low-dose CT scan of the lungs is recommended if you:  Currently smoke.  Have a history of at least 30 pack-years of smoking and you currently  smoke or have quit within the past 15 years. A pack-year is smoking an average of one pack of cigarettes per day for one year.  Yearly screening should:  Continue until it has been 15 years since you quit.  Stop if you develop a health problem that would prevent you from having lung cancer treatment.  Colorectal Cancer  This type of cancer can be detected and can often be prevented.  Routine colorectal cancer screening usually begins at age 71 and continues through age 31.  If you have risk factors for colon cancer, your health care provider may recommend that you be screened at an earlier age.  If you have a family history of  colorectal cancer, talk with your health care provider about genetic screening.  Your health care provider may also recommend using home test kits to check for hidden blood in your stool.  A small camera at the end of a tube can be used to examine your colon directly (sigmoidoscopy or colonoscopy). This is done to check for the earliest forms of colorectal cancer.  Direct examination of the colon should be repeated every 5-10 years until age 27. However, if early forms of precancerous polyps or small growths are found or if you have a family history or genetic risk for colorectal cancer, you may need to be screened more often.  Skin Cancer  Check your skin from head to toe regularly.  Monitor any moles. Be sure to tell your health care provider: ? About any new moles or changes in moles, especially if there is a change in a mole's shape or color. ? If you have a mole that is larger than the size of a pencil eraser.  If any of your family members has a history of skin cancer, especially at a young age, talk with your health care provider about genetic screening.  Always use sunscreen. Apply sunscreen liberally and repeatedly throughout the day.  Whenever you are outside, protect yourself by wearing long sleeves, pants, a wide-brimmed hat, and sunglasses.  What should I know about osteoporosis? Osteoporosis is a condition in which bone destruction happens more quickly than new bone creation. After menopause, you may be at an increased risk for osteoporosis. To help prevent osteoporosis or the bone fractures that can happen because of osteoporosis, the following is recommended:  If you are 69-71 years old, get at least 1,000 mg of calcium and at least 600 mg of vitamin D per day.  If you are older than age 13 but younger than age 70, get at least 1,200 mg of calcium and at least 600 mg of vitamin D per day.  If you are older than age 80, get at least 1,200 mg of calcium and at least 800 mg  of vitamin D per day.  Smoking and excessive alcohol intake increase the risk of osteoporosis. Eat foods that are rich in calcium and vitamin D, and do weight-bearing exercises several times each week as directed by your health care provider. What should I know about how menopause affects my mental health? Depression may occur at any age, but it is more common as you become older. Common symptoms of depression include:  Low or sad mood.  Changes in sleep patterns.  Changes in appetite or eating patterns.  Feeling an overall lack of motivation or enjoyment of activities that you previously enjoyed.  Frequent crying spells.  Talk with your health care provider if you think that you are experiencing depression. What should  I know about immunizations? It is important that you get and maintain your immunizations. These include:  Tetanus, diphtheria, and pertussis (Tdap) booster vaccine.  Influenza every year before the flu season begins.  Pneumonia vaccine.  Shingles vaccine.  Your health care provider may also recommend other immunizations. This information is not intended to replace advice given to you by your health care provider. Make sure you discuss any questions you have with your health care provider. Document Released: 01/14/2006 Document Revised: 06/11/2016 Document Reviewed: 08/26/2015 Elsevier Interactive Patient Education  2018 [employer].

## 2018-10-18 ENCOUNTER — Telehealth: Payer: Self-pay | Admitting: Family Medicine

## 2018-10-18 MED ORDER — MECLIZINE HCL 25 MG PO TABS: ORAL_TABLET | ORAL | 3 refills | Status: DC

## 2018-10-18 NOTE — Telephone Encounter (Signed)
Pt contacted office for refill request on the following medications:  meclizine (ANTIVERT) 25 MG tablet   [facility] [place]  Last Rx: 04/07/17 LOV: 08/24/18 Please advise. Thanks TNP

## 2018-10-18 NOTE — Telephone Encounter (Signed)
Please advise. Patient is requesting a refill on Meclizine 25mg  tablet. The current prescription in her chart is for 12.5mg  tablet (this was entered historically by [facility]). I see in the inactive medications section of her chart that patient has been prescribed a 25mg  dose in the past.

## 2018-11-11 ENCOUNTER — Other Ambulatory Visit: Payer: Self-pay | Admitting: Nurse Practitioner

## 2018-12-18 ENCOUNTER — Other Ambulatory Visit: Payer: Self-pay | Admitting: Family Medicine

## 2018-12-18 MED ORDER — ATENOLOL 50 MG PO TABS: 50.0000 mg | ORAL_TABLET | Freq: Every day | ORAL | 3 refills | Status: DC

## 2018-12-18 MED ORDER — LEVOTHYROXINE SODIUM 75 MCG PO TABS: 75.0000 ug | ORAL_TABLET | Freq: Every day | ORAL | 3 refills | Status: DC

## 2018-12-18 MED ORDER — LETROZOLE 2.5 MG PO TABS: 2.5000 mg | ORAL_TABLET | Freq: Every day | ORAL | 3 refills | Status: DC

## 2018-12-18 MED ORDER — HYDROCHLOROTHIAZIDE 25 MG PO TABS: 25.0000 mg | ORAL_TABLET | Freq: Every day | ORAL | 4 refills | Status: DC

## 2018-12-18 NOTE — Telephone Encounter (Signed)
Patient is requesting refills on Letrozole 2.5 mg. (Accord brand only), HCTZ 25 mg., Atenolol 50 mg and Levothyroxine 75 mcg sent to [facility] mail in pharmacy.  Needs  90 days worth with 3 refills please.

## 2018-12-18 NOTE — Telephone Encounter (Signed)
Insurance requires medical reason why she needs accord brand only.

## 2018-12-18 NOTE — Telephone Encounter (Signed)
Pt states she has tried other brands and they cause severe joint pain, headaches and nausea.  She states once she changed to Accord the side effects have improved significantly.   Thanks,   -Mickel Baas

## 2019-01-23 ENCOUNTER — Telehealth: Payer: Self-pay | Admitting: Family Medicine

## 2019-01-23 NOTE — Telephone Encounter (Signed)
Done, please fax

## 2019-01-23 NOTE — Telephone Encounter (Signed)
Pt calling needing a bra prescription written to:  [facility] on [address] in [place]. Fax [phone]  Bra Prescription: 10 Bra's 2 prothesis  Please advise.  Thanks, [employer]

## 2019-01-24 NOTE — Telephone Encounter (Signed)
RX Faxed to the number below.   Thanks,   -Mickel Baas

## 2019-01-31 ENCOUNTER — Encounter: Payer: Self-pay | Admitting: Family Medicine

## 2019-03-07 ENCOUNTER — Other Ambulatory Visit: Payer: Self-pay

## 2019-03-07 NOTE — Telephone Encounter (Signed)
Medication called into [facility]

## 2019-03-07 NOTE — Telephone Encounter (Signed)
Please call into [facility]. (cannot be by e-presscribe)

## 2019-03-07 NOTE — Telephone Encounter (Signed)
Patient is requesting a refill on the magic mouth wash. She uses [facility] in [place].

## 2019-03-08 MED ORDER — MAGIC MOUTHWASH: ORAL | 1 refills | Status: DC

## 2019-03-13 ENCOUNTER — Other Ambulatory Visit: Payer: Self-pay

## 2019-03-13 MED ORDER — NYSTATIN 100000 UNIT/GM EX POWD: Freq: Four times a day (QID) | CUTANEOUS | 3 refills | Status: DC

## 2019-03-13 NOTE — Telephone Encounter (Signed)
Pt has had a Mastectomy a few years ago.  She states occasionally she gets a rash under a "flap" of skin that was left.  She says her surgeon prescribed her nystatin powder in the past and clears it up.  She states the bottle has expired and would like for you to send a refill to [facility] in [place]   Thanks,   -Mickel Baas

## 2019-03-21 ENCOUNTER — Telehealth: Payer: Self-pay | Admitting: Family Medicine

## 2019-03-21 DIAGNOSIS — B372 Candidiasis of skin and nail: Secondary | ICD-10-CM

## 2019-03-21 MED ORDER — KETOCONAZOLE 2 % EX CREA: 1.0000 "application " | TOPICAL_CREAM | Freq: Every day | CUTANEOUS | 3 refills | Status: DC

## 2019-03-21 NOTE — Telephone Encounter (Signed)
Have sent refill to [facility] in [place]. Need to be seen if not improving within a week.

## 2019-03-21 NOTE — Telephone Encounter (Signed)
Pt has a rash back of her knees and inside of her arms and several other spots on her body.Kaylee Reyes  She was asking for a refill on the Ketoconazole 2 % cream but not sure if this cream is what she needs to use for this rash.  She is not sure what the rash is.  Does she need to do a visit?  Please advise [phone]  Thanks Con Memos

## 2019-03-21 NOTE — Telephone Encounter (Signed)
Pt advised.   Thanks,   -Laura  

## 2019-03-26 ENCOUNTER — Ambulatory Visit: Payer: PRIVATE HEALTH INSURANCE | Admitting: Oncology

## 2019-05-14 ENCOUNTER — Telehealth: Payer: Self-pay | Admitting: Family Medicine

## 2019-05-14 NOTE — Telephone Encounter (Signed)
Pt advised.   Thanks,   -F[place]er [place]  

## 2019-05-14 NOTE — Telephone Encounter (Signed)
Theres no need to test unless his comes back positive or if she develops any sx. She should presume he has it until the results come back, and take appropriate precautions.

## 2019-05-14 NOTE — Telephone Encounter (Signed)
pts husband was showing symptoms covid and had the test today.  Pt wants to know if she should be tested.  She is not showing symptons but she is a cancer patient.  CB# [phone]    Thanks Con Memos

## 2019-05-16 ENCOUNTER — Telehealth: Payer: Self-pay | Admitting: *Deleted

## 2019-05-16 ENCOUNTER — Telehealth: Payer: Self-pay

## 2019-05-16 DIAGNOSIS — Z20822 Contact with and (suspected) exposure to covid-19: Secondary | ICD-10-CM

## 2019-05-16 NOTE — Telephone Encounter (Signed)
[facility] called to schedule a covid-19 test for this patient. Pt notified and was scheduled to be tested tomorrow at 9 am at the [facility] in [place].  Pt advised to wear mask, stay in car with windows rolled up until time for test. Pt voiced understanding.

## 2019-05-16 NOTE — Telephone Encounter (Signed)
Please set patient up for outpatient COVID testing as she is high risk and has exposure.   Even if test is negative, she still needs to quarantine, just as her husband needs to. FYI to PCP, CMA please advise patient, P[facility] please schedule

## 2019-05-16 NOTE — Telephone Encounter (Signed)
I called and advised patient that she will still need to quarantine even if her test was negative. Patient verbally voiced understanding. She advised me that someone has already reached out to her and scheduled her to have a COVID -19 test tomorrow at 9am.

## 2019-05-16 NOTE — Telephone Encounter (Signed)
Patient states that her husband's coronavirus test came back positive today. She is a cancer patient and was advised that she needed to get a test done also. She is not currently having any symptoms. Please advise

## 2019-05-17 ENCOUNTER — Other Ambulatory Visit: Payer: PRIVATE HEALTH INSURANCE

## 2019-05-17 DIAGNOSIS — Z20822 Contact with and (suspected) exposure to covid-19: Secondary | ICD-10-CM

## 2019-05-17 DIAGNOSIS — R6889 Other general symptoms and signs: Secondary | ICD-10-CM | POA: Diagnosis not present

## 2019-05-18 ENCOUNTER — Other Ambulatory Visit: Payer: Self-pay | Admitting: Family Medicine

## 2019-05-19 LAB — NOVEL CORONAVIRUS, NAA: SARS-CoV-2, NAA: NOT DETECTED

## 2019-06-12 DIAGNOSIS — M25551 Pain in right hip: Secondary | ICD-10-CM | POA: Diagnosis not present

## 2019-06-12 DIAGNOSIS — M1611 Unilateral primary osteoarthritis, right hip: Secondary | ICD-10-CM | POA: Diagnosis not present

## 2019-06-12 DIAGNOSIS — M47816 Spondylosis without myelopathy or radiculopathy, lumbar region: Secondary | ICD-10-CM | POA: Diagnosis not present

## 2019-06-12 DIAGNOSIS — M5136 Other intervertebral disc degeneration, lumbar region: Secondary | ICD-10-CM | POA: Diagnosis not present

## 2019-06-12 DIAGNOSIS — M5417 Radiculopathy, lumbosacral region: Secondary | ICD-10-CM | POA: Diagnosis not present

## 2019-06-20 ENCOUNTER — Inpatient Hospital Stay: Payer: Medicare Other | Admitting: Oncology

## 2019-07-11 ENCOUNTER — Telehealth: Payer: Self-pay | Admitting: Family Medicine

## 2019-07-11 ENCOUNTER — Telehealth (INDEPENDENT_AMBULATORY_CARE_PROVIDER_SITE_OTHER): Payer: Medicare Other | Admitting: Physician Assistant

## 2019-07-11 ENCOUNTER — Encounter: Payer: Self-pay | Admitting: Physician Assistant

## 2019-07-11 VITALS — Temp 98.0°F

## 2019-07-11 DIAGNOSIS — H60391 Other infective otitis externa, right ear: Secondary | ICD-10-CM

## 2019-07-11 MED ORDER — OFLOXACIN 0.3 % OP SOLN: 1.0000 [drp] | Freq: Three times a day (TID) | OPHTHALMIC | 0 refills | Status: DC

## 2019-07-11 MED ORDER — NEOMYCIN-POLYMYXIN-HC 3.5-10000-1 OT SOLN: 3.0000 [drp] | Freq: Three times a day (TID) | OTIC | 0 refills | Status: DC

## 2019-07-11 NOTE — Telephone Encounter (Signed)
Changed to ofloxacin. It was sent for ophthalmic drops because those were on her formulary. She will just use in her ear instead.

## 2019-07-11 NOTE — Progress Notes (Signed)
Patient: Kaylee Reyes Female    DOB: 1951-09-30   68 y.o.   MRN: [id number] Visit Date: 07/11/2019  Today's Provider: Mar Daring, PA-C   No chief complaint on file.  Subjective:    Virtual Visit via Video Note  I connected with Jaquita Folds on 07/11/19 at  2:20 PM EDT by a video enabled telemedicine application and verified that I am speaking with the correct person using two identifiers.  Location: Patient: Home Provider: [facility]   I discussed the limitations of evaluation and management by telemedicine and the availability of in person appointments. The patient expressed understanding and agreed to proceed.  HPI Patient is a 68 yr old female that presents via video visit for ear pain in the right ear. Pain has been present x 4 days. Patient admits to cleaning her ear with a bobby pin and reports she felt some pain. She has been using peroxide on a qtip but reports now she is having trouble getting the qtip in and the canal feels swollen.  Allergies  Allergen Reactions  . Penicillins Shortness Of Breath    Has patient had a PCN reaction causing immediate rash, facial/tongue/throat swelling, SOB or lightheadedness with hypotension: Yes Has patient had a PCN reaction causing severe rash involving mucus membranes or skin necrosis: No Has patient had a PCN reaction that required hospitalization No Has patient had a PCN reaction occurring within the last 10 years: No If all of the above answers are "NO", then may proceed with Cephalosporin use.   . Nickel Hives  . Pravastatin Sodium Other (See Comments)    Myalgia      Current Outpatient Medications:  .  atenolol (TENORMIN) 50 MG tablet, Take 1 tablet (50 mg total) by mouth daily., Disp: 90 tablet, Rfl: 3 .  B Complex-C (SUPER B COMPLEX PO), Take 1 tablet by mouth daily., Disp: , Rfl:  .  CALCIUM CITRATE PO, Take 1 tablet by mouth daily., Disp: , Rfl:  .  docusate sodium (COLACE) 100 MG capsule, Take 200 mg  by mouth daily. , Disp: , Rfl:  .  ergocalciferol (VITAMIN D2) 50000 units capsule, Take 1 capsule (50,000 Units total) by mouth once a week., Disp: 60 capsule, Rfl: 0 .  hydrochlorothiazide (HYDRODIURIL) 25 MG tablet, Take 1 tablet (25 mg total) by mouth daily., Disp: 90 tablet, Rfl: 4 .  ibuprofen (ADVIL,MOTRIN) 200 MG tablet, Take 400 mg by mouth every 6 (six) hours as needed for mild pain., Disp: , Rfl:  .  ketoconazole (NIZORAL) 2 % cream, Apply 1 application topically daily., Disp: 50 g, Rfl: 3 .  letrozole (FEMARA) 2.5 MG tablet, Take 1 tablet (2.5 mg total) by mouth daily. ACCORD brand only, Disp: 90 tablet, Rfl: 3 .  levothyroxine (SYNTHROID, LEVOTHROID) 75 MCG tablet, Take 1 tablet (75 mcg total) by mouth daily., Disp: 90 tablet, Rfl: 3 .  Loratadine (CLARITIN) 10 MG CAPS, Take by mouth., Disp: , Rfl:  .  magic mouthwash SOLN, 1 part 2% viscous lidocaine: 1 part Maalox: 1 part diphenhydramine 12.5 mg per 5 ml elixir. Rinse and spit 5 ml every six hour as needed, Disp: 60 mL, Rfl: 1 .  Magnesium 400 MG TABS, Take 600 mg by mouth daily. , Disp: , Rfl:  .  meclizine (ANTIVERT) 25 MG tablet, TAKE 1 TABLET BY MOUTH EVERY 4 TO 6 HOURS AS NEEDED FOR NAUSEA, Disp: 30 tablet, Rfl: 5 .  Misc Natural Products (TURMERIC  CURCUMIN) CAPS, Take 1 capsule by mouth daily., Disp: , Rfl:  .  nystatin (NYSTATIN) powder, Apply topically 4 (four) times daily., Disp: 15 g, Rfl: 3 No current facility-administered medications for this visit.   Facility-Administered Medications Ordered in Other Visits:  .  sodium chloride flush (NS) 0.9 % injection 10 mL, 10 mL, Intravenous, PRN, Lloyd Huger, MD, 10 mL at 02/09/16 0930 .  sodium chloride flush (NS) 0.9 % injection 10 mL, 10 mL, Intravenous, PRN, Lloyd Huger, MD, 10 mL at 06/21/16 0950  Review of Systems  Constitutional: Negative.   HENT: Positive for ear pain. Negative for congestion, ear discharge, hearing loss, postnasal drip, rhinorrhea, sinus  pressure, sinus pain, sore throat and tinnitus.   Respiratory: Negative.   Cardiovascular: Negative.   Neurological: Negative.     Social History   Tobacco Use  . Smoking status: Never Smoker  . Smokeless tobacco: Never Used  Substance Use Topics  . Alcohol use: No    Alcohol/week: 0.0 standard drinks      Objective:   Temp 98 F (36.7 C) (Oral)  Vitals:   07/11/19 1406  Temp: 98 F (36.7 C)  TempSrc: Oral     Physical Exam Vitals signs reviewed.  Constitutional:      Appearance: Normal appearance. She is well-developed. She is obese. She is not ill-appearing.  HENT:     Head: Normocephalic and atraumatic.  Neck:     Musculoskeletal: Normal range of motion and neck supple.  Pulmonary:     Effort: Pulmonary effort is normal. No respiratory distress.  Neurological:     Mental Status: She is alert.  Psychiatric:        Mood and Affect: Mood normal.        Behavior: Behavior normal.        Thought Content: Thought content normal.        Judgment: Judgment normal.      No results found for any visits on 07/11/19.     Assessment & Plan     1. Other infective acute otitis externa of right ear Suspect otitis externa from cleaning ear with bobby pin, most likely scratched the canal and caused infection. Will treat with cortisporin as below. Call if not improving. - neomycin-polymyxin-hydrocortisone (CORTISPORIN) OTIC solution; Place 3 drops into the right ear 3 (three) times daily. X 5-7 days  Dispense: 10 mL; Refill: 0   I discussed the assessment and treatment plan with the patient. The patient was provided an opportunity to ask questions and all were answered. The patient agreed with the plan and demonstrated an understanding of the instructions.   The patient was advised to call back or seek an in-person evaluation if the symptoms worsen or if the condition fails to improve as anticipated.  I provided 12 minutes of non-face-to-face time during this encounter.      Mar Daring, PA-C  [facility]

## 2019-07-11 NOTE — Patient Instructions (Signed)
Otitis Externa  Otitis externa is an infection of the outer ear canal. The outer ear canal is the area between the outside of the ear and the eardrum. Otitis externa is sometimes called swimmer's ear. What are the causes? Common causes of this condition include:  Swimming in dirty water.  Moisture in the ear.  An injury to the inside of the ear.  An object stuck in the ear.  A cut or scrape on the outside of the ear. What increases the risk? You are more likely to get this condition if you go swimming often. What are the signs or symptoms?  Itching in the ear. This is often the first symptom.  Swelling of the ear.  Redness in the ear.  Ear pain. The pain may get worse when you pull on your ear.  Pus coming from the ear. How is this treated? This condition may be treated with:  Antibiotic ear drops. These are often given for 10-14 days.  Medicines to reduce itching and swelling. Follow these instructions at home:  If you were given antibiotic ear drops, use them as told by your doctor. Do not stop using them even if your condition gets better.  Take over-the-counter and prescription medicines only as told by your doctor.  Avoid getting water in your ears as told by your doctor. You may be told to avoid swimming or water sports for a few days.  Keep all follow-up visits as told by your doctor. This is important. How is this prevented?  Keep your ears dry. Use the corner of a towel to dry your ears after you swim or bathe.  Try not to scratch or put things in your ear. Doing these things makes it easier for germs to grow in your ear.  Avoid swimming in lakes, dirty water, or pools that may not have the right amount of a chemical called chlorine. Contact a doctor if:  You have a fever.  Your ear is still red, swollen, or painful after 3 days.  You still have pus coming from your ear after 3 days.  Your redness, swelling, or pain gets worse.  You have a really  bad headache.  You have redness, swelling, pain, or tenderness behind your ear. Summary  Otitis externa is an infection of the outer ear canal.  Symptoms include pain, redness, and swelling of the ear.  If you were given antibiotic ear drops, use them as told by your doctor. Do not stop using them even if your condition gets better.  Try not to scratch or put things in your ear. This information is not intended to replace advice given to you by your health care provider. Make sure you discuss any questions you have with your health care provider. Document Released: 05/10/2008 Document Revised: 04/28/2018 Document Reviewed: 04/28/2018 Elsevier Patient Education  [place]. Hydrocortisone; Neomycin; Polymyxin B ear solution What is this medicine? HYDROCORTISONE; NEOMYCIN; and POLYMYXIN B (hye droe KOR ti sone; nee oh MYE sin; pol i MIX in B) is used to treat ear infections. This medicine may be used for other purposes; ask your health care provider or pharmacist if you have questions. COMMON BRAND NAME(S): AK-Spore HC, AK-Spore HC Otic, Antibiotic Otic, Cortisporin, Cortomycin, Oti-Sone, Oticin HC, Otimar, Otocidin What should I tell my health care provider before I take this medicine? They need to know if you have any of these conditions:  any other active infections  chronic ear infections or fluid in the ear  perforated ear drum  an unusual or allergic reaction to hydrocortisone, neomycin, polymyxin B, sulfites, other medicines, foods, dyes, or preservatives  pregnant or trying to get pregnant  breast-feeding How should I use this medicine? This medicine is only for use in the ears. Follow the directions on the prescription label. Wash hands before and after use. Clean your ear of any fluid that can be easily removed. Do not insert any object or swab into the ear canal. Gently warm the bottle by holding it in the hand for 1 to 2 minutes. Lie down on your side with the  infected ear facing upward. Try not to touch the tip of the dropper to your ear, fingertips, or other surface. Squeeze the bottle gently to put the prescribed number of drops in the ear canal. Stay in this position for 30 to 60 seconds to help the drops soak into the ear. Repeat the steps for the other ear if both ears are infected. Do not use your medicine more often than directed. Finish the full course of medicine prescribed by your doctor or health care professional even if you think your condition is better. Talk to your pediatrician regarding the use of this medicine in children. While this drug may be prescribed for selected conditions, precautions do apply. Overdosage: If you think you have taken too much of this medicine contact a poison control center or emergency room at once. NOTE: This medicine is only for you. Do not share this medicine with others. What if I miss a dose? If you miss a dose, use it as soon as you can. If it is almost time for your next dose, use only that dose. Do not take double or extra doses. What may interact with this medicine? Interactions are not expected. Do not use other ear products without talking to your doctor or health care professional. This list may not describe all possible interactions. Give your health care provider a list of all the medicines, herbs, non-prescription drugs, or dietary supplements you use. Also tell them if you smoke, drink alcohol, or use illegal drugs. Some items may interact with your medicine. What should I watch for while using this medicine? Tell your doctor or health care professional if your ear infection does not get better in a few days. Do not use longer than 10 days unless instructed by your doctor or health care professional. If rash or allergic reaction occurs, stop the product immediately and contact your physician. It is important that you keep the infected ear(s) clean and dry. When bathing, try not to get the infected  ear(s) wet. Do not go swimming unless your doctor or health care professional has told you otherwise. To prevent the spread of infection, do not share ear products, or share towels and washcloths with anyone else. What side effects may I notice from receiving this medicine? Side effects that you should report to your doctor or health care professional as soon as possible:  rash  red, itchy, dry scaly skin at the affected site  worsening ear pain Side effects that usually do not require medical attention (report to your doctor or health care professional if they continue or are bothersome):  abnormal sensation in the ear  burning or stinging while putting the drops in the ear This list may not describe all possible side effects. Call your doctor for medical advice about side effects. You may report side effects to FDA at 1-800-FDA-1088. Where should I keep my medicine? Keep out  of the reach of children. Store at room temperature between 15 and 25 degrees C (59 and 77 degrees F). Do not freeze. Throw away any unused medicine after the expiration date. NOTE: This sheet is a summary. It may not cover all possible information. If you have questions about this medicine, talk to your doctor, pharmacist, or health care provider.  2020 Elsevier/Gold Standard (2015-05-13 15:13:48)

## 2019-07-11 NOTE — Telephone Encounter (Signed)
Pt needing to know if another medication can be called in for her ear.  The out of pocket cost was around $80.  This is too high for out of pocket cost.  Please let pt know.  Thanks, [employer]

## 2019-07-12 NOTE — Telephone Encounter (Signed)
Patient was advised.  

## 2019-07-16 ENCOUNTER — Other Ambulatory Visit: Payer: Self-pay

## 2019-07-16 ENCOUNTER — Ambulatory Visit: Payer: Medicare Other | Admitting: Radiation Oncology

## 2019-07-16 ENCOUNTER — Other Ambulatory Visit: Payer: Self-pay | Admitting: Oncology

## 2019-07-19 ENCOUNTER — Telehealth: Payer: Self-pay

## 2019-07-19 DIAGNOSIS — M62838 Other muscle spasm: Secondary | ICD-10-CM

## 2019-07-19 MED ORDER — CYCLOBENZAPRINE HCL 5 MG PO TABS: 5.0000 mg | ORAL_TABLET | Freq: Three times a day (TID) | ORAL | 1 refills | Status: DC | PRN

## 2019-07-19 NOTE — Telephone Encounter (Signed)
Patient is requesting a prescription for Flexeril. She has not had the medication in a while and needs it for back pain. Please advise.

## 2019-07-19 NOTE — Telephone Encounter (Signed)
Patient notified

## 2019-07-19 NOTE — Telephone Encounter (Signed)
Sent to [facility].

## 2019-07-20 NOTE — Progress Notes (Signed)
[place]  [phone]) [phone] [phone]) [phone]  ID: Kaylee Reyes OB: 11-Feb-1951  MR#: [id number]  WNU#:[id number]  Patient Care Team: Birdie Sons, MD as PCP - General (Family Medicine) [name], MD as Consulting Physician (Gastroenterology) Diamond Nickel, DO as Consulting Physician (Sports Medicine) Sharlet Salina, MD as Referring Physician (Physical Medicine and Rehabilitation) Olean Ree, MD as Consulting Physician (General Surgery) Lloyd Huger, MD as Consulting Physician (Oncology) Noreene Filbert, MD as Referring Physician (Radiation Oncology)  CHIEF COMPLAINT: Pathologic stage IIB ER/PR positive invasive lobular carcinoma of the right upper breast.  INTERVAL HISTORY: Patient returns to clinic today for routine 56-month evaluation.  She has gained weight in the interim, but attributes this to poor diet during COVID-19.  She reports her husband was found to be positive, but she was tested and was negative.  She currently feels well and is asymptomatic.  She continues to tolerate letrozole without significant side effects.  She has noticed increasing right shoulder pain as well as back pain.  She denies any recent fevers or illnesses.  She denies any chest pain, shortness of breath, cough, or hemoptysis.  She has no nausea, vomiting, constipation, or diarrhea. She has no urinary complaints.  Patient offers no further specific complaints today.  REVIEW OF SYSTEMS:   Review of Systems  Constitutional: Negative.  Negative for fever, malaise/fatigue and weight loss.  Respiratory: Negative.  Negative for cough and shortness of breath.   Cardiovascular: Negative.  Negative for chest pain and leg swelling.  Gastrointestinal: Negative.  Negative for abdominal pain and constipation.  Genitourinary: Negative.  Negative for dysuria.  Musculoskeletal: Positive for back pain and joint pain.  Skin: Negative.  Negative for rash.  Neurological:  Positive for sensory change. Negative for weakness.  Psychiatric/Behavioral: Negative.  Negative for memory loss. The patient is not nervous/anxious.     As per HPI. Otherwise, a complete review of systems is negative.  PAST MEDICAL HISTORY: Past Medical History:  Diagnosis Date  . Anemia   . Cancer ([place]) 09/2015   breast cancer  . GERD (gastroesophageal reflux disease)   . History of chicken pox   . Hyperlipidemia   . Hypertension   . Hypothyroidism   . Neuropathy of foot    bilateral  . Pinched nerve   . PONV (postoperative nausea and vomiting)    exploratory surgery at [facility]  . Thyroid disease   . Tuberculosis exposure    Mother had TB.  Pt shows (+) on tests, but has never had.  . Vertigo    last episode over 1 yr ago  . Wears dentures    partial upper and lower    PAST SURGICAL HISTORY: Past Surgical History:  Procedure Laterality Date  . ABDOMINAL HYSTERECTOMY  07/13/2013   Total. with BSO for postmenopausal bleeding and fibroids with large cervical polyp. Dr. Levy Sjogren and Dr. Glennon Mac at [facility]  . ABDOMINAL ULTRASOUND  04/17/2011   Hepatomegaly with borderline slenomegaly. Suggestive of fatty liver. s/p cholecystectomy. Portions of aorta obscured  . BREAST SURGERY Right 2011   BREAST BIOPSY  . CESAREAN SECTION  1988  . CHOLECYSTECTOMY  1990  . COLONOSCOPY WITH PROPOFOL N/A 09/01/2017   Procedure: COLONOSCOPY WITH PROPOFOL;  Surgeon: [name], MD;  Location: [place];  Service: Gastroenterology;  Laterality: N/A;  . EXPLORATORY LAPAROTOMY     Left breast  . MASTECTOMY W/ SENTINEL NODE BIOPSY Bilateral 10/28/2015   Procedure: MASTECTOMY WITH SENTINEL LYMPH NODE BIOPSY;  Surgeon:  Hubbard Robinson, MD;  Location: [facility];  Service: General;  Laterality: Bilateral;  . PORT-A-CATH REMOVAL Left 10/11/2016   Procedure: REMOVAL PORT-A-CATH;  Surgeon: Hubbard Robinson, MD;  Location: [facility];  Service: General;  Laterality: Left;  . PORTACATH PLACEMENT Left  12/23/2015   Procedure: INSERTION PORT-A-CATH;  Surgeon: Hubbard Robinson, MD;  Location: [facility];  Service: General;  Laterality: Left;  . WRIST SURGERY      FAMILY HISTORY Family History  Problem Relation Age of Onset  . Hypertension Mother   . Thyroid disease Mother   . Hypertension Brother   . Diabetes Brother   . Breast cancer Neg Hx   . Ovarian cancer Neg Hx   . Colon cancer Neg Hx        ADVANCED DIRECTIVES:    HEALTH MAINTENANCE: Social History   Tobacco Use  . Smoking status: Never Smoker  . Smokeless tobacco: Never Used  Substance Use Topics  . Alcohol use: No    Alcohol/week: 0.0 standard drinks  . Drug use: No     Colonoscopy:  PAP:  Bone density:  Lipid panel:  Allergies  Allergen Reactions  . Penicillins Shortness Of Breath    Has patient had a PCN reaction causing immediate rash, facial/tongue/throat swelling, SOB or lightheadedness with hypotension: Yes Has patient had a PCN reaction causing severe rash involving mucus membranes or skin necrosis: No Has patient had a PCN reaction that required hospitalization No Has patient had a PCN reaction occurring within the last 10 years: No If all of the above answers are "NO", then may proceed with Cephalosporin use.   . Nickel Hives  . Pravastatin Sodium Other (See Comments)    Myalgia     Current Outpatient Medications  Medication Sig Dispense Refill  . atenolol (TENORMIN) 50 MG tablet Take 1 tablet (50 mg total) by mouth daily. 90 tablet 3  . B Complex-C (SUPER B COMPLEX PO) Take 1 tablet by mouth daily.    Marland Kitchen CALCIUM CITRATE PO Take 1 tablet by mouth daily.    Marland Kitchen docusate sodium (COLACE) 100 MG capsule Take 200 mg by mouth daily.     . hydrochlorothiazide (HYDRODIURIL) 25 MG tablet Take 1 tablet (25 mg total) by mouth daily. 90 tablet 4  . ibuprofen (ADVIL,MOTRIN) 200 MG tablet Take 400 mg by mouth every 6 (six) hours as needed for mild pain.    Marland Kitchen ketoconazole (NIZORAL) 2 % cream Apply 1  application topically daily. 50 g 3  . letrozole (FEMARA) 2.5 MG tablet Take 1 tablet (2.5 mg total) by mouth daily. ACCORD brand only 90 tablet 3  . levothyroxine (SYNTHROID, LEVOTHROID) 75 MCG tablet Take 1 tablet (75 mcg total) by mouth daily. 90 tablet 3  . Loratadine (CLARITIN) 10 MG CAPS Take by mouth.    . magic mouthwash SOLN 1 part 2% viscous lidocaine: 1 part Maalox: 1 part diphenhydramine 12.5 mg per 5 ml elixir. Rinse and spit 5 ml every six hour as needed 60 mL 1  . Magnesium 400 MG TABS Take 600 mg by mouth daily.     . meclizine (ANTIVERT) 25 MG tablet TAKE 1 TABLET BY MOUTH EVERY 4 TO 6 HOURS AS NEEDED FOR NAUSEA 30 tablet 5  . Misc Natural Products (TURMERIC CURCUMIN) CAPS Take 1 capsule by mouth daily.    Marland Kitchen nystatin (NYSTATIN) powder Apply topically 4 (four) times daily. 15 g 3  . Vitamin D, Ergocalciferol, (DRISDOL) 1.25 MG (50000 UT) CAPS capsule  Take 1 capsule (50,000 Units total) by mouth once a week. 12 capsule 0  . cyclobenzaprine (FLEXERIL) 5 MG tablet Take 1 tablet (5 mg total) by mouth 3 (three) times daily as needed for muscle spasms. (Patient not taking: Reported on 07/25/2019) 30 tablet 1  . neomycin-polymyxin-hydrocortisone (CORTISPORIN) OTIC solution Place 3 drops into the right ear 3 (three) times daily. X 5-7 days 10 mL 0  . ofloxacin (OCUFLOX) 0.3 % ophthalmic solution Place 1 drop into the right ear 3 (three) times daily. 5 mL 0   No current facility-administered medications for this visit.    Facility-Administered Medications Ordered in Other Visits  Medication Dose Route Frequency Provider Last Rate Last Dose  . sodium chloride flush (NS) 0.9 % injection 10 mL  10 mL Intravenous PRN Lloyd Huger, MD   10 mL at 02/09/16 0930  . sodium chloride flush (NS) 0.9 % injection 10 mL  10 mL Intravenous PRN Lloyd Huger, MD   10 mL at 06/21/16 0950    OBJECTIVE: Vitals:   07/26/19 0954  BP: (!) 169/95  Pulse: 65  Temp: (!) 97.3 F (36.3 C)      Body mass index is 36.48 kg/m.    ECOG FS:0 - Asymptomatic  General: Well-developed, well-nourished, no acute distress. Eyes: Pink conjunctiva, anicteric sclera. HEENT: Normocephalic, moist mucous membranes. Breast: Bilateral mastectomy. Lungs: Clear to auscultation bilaterally. Heart: Regular rate and rhythm. No rubs, murmurs, or gallops. Abdomen: Soft, nontender, nondistended. No organomegaly noted, normoactive bowel sounds. Musculoskeletal: No edema, cyanosis, or clubbing. Neuro: Alert, answering all questions appropriately. Cranial nerves grossly intact. Skin: No rashes or petechiae noted. Psych: Normal affect.  LAB RESULTS:  Lab Results  Component Value Date   NA 143 04/04/2017   K 4.6 04/04/2017   CL 99 04/04/2017   CO2 27 04/04/2017   GLUCOSE 111 (H) 04/04/2017   BUN 12 04/04/2017   CREATININE 0.84 04/04/2017   CALCIUM 9.9 04/04/2017   PROT 7.6 04/04/2017   ALBUMIN 4.2 04/04/2017   AST 27 04/04/2017   ALT 29 04/04/2017   ALKPHOS 108 04/04/2017   BILITOT 0.4 04/04/2017   GFRNONAA 74 04/04/2017   GFRAA 85 04/04/2017    Lab Results  Component Value Date   WBC 8.1 04/04/2017   NEUTROABS 6.4 04/04/2017   HGB 11.6 04/04/2017   HCT 34.1 04/04/2017   MCV 83 04/04/2017   PLT 219 04/04/2017   Lab Results  Component Value Date   IRON 48 02/09/2016   TIBC 326 02/09/2016   IRONPCTSAT 15 02/09/2016     STUDIES: No results found.  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  PLAN:    1. Pathologic stage IIB ER/PR positive invasive lobular carcinoma of the right upper breast: Patient is now status post bilateral mastectomy, therefore does not require additional mammograms. She completed 4 cycles of Taxotere and Cytoxan on March 03, 2016.  She also completed adjuvant XRT.  Continue letrozole for a total of 5 years completing in July 2022.  Return to clinic in 6 months for further evaluation via video assisted  telemedicine visit. 2.  Bone health: Patient had bone mineral density on July 03, 2018 that reported a T score of 0.5 which is considered normal.  Repeat in July 2021.  Return to clinic as above.   I spent a total of 20 minutes face-to-face with the patient of which greater than 50% of the visit was spent in counseling  and coordination of care as detailed above.   Patient expressed understanding and was in agreement with this plan. She also understands that She can call clinic at any time with any questions, concerns, or complaints.   Invasive lobular carcinoma of right breast, stage 2 ([place])   Staging form: Breast, AJCC 7th Edition     Pathologic stage: Stage IIB (T2, N1a, M0) - Signed by Lloyd Huger, MD on 10/22/2015   Lloyd Huger, MD   07/26/2019 10:54 AM

## 2019-07-24 ENCOUNTER — Other Ambulatory Visit: Payer: Self-pay | Admitting: *Deleted

## 2019-07-24 MED ORDER — VITAMIN D (ERGOCALCIFEROL) 1.25 MG (50000 UNIT) PO CAPS: 50000.0000 [IU] | ORAL_CAPSULE | ORAL | 0 refills | Status: DC

## 2019-07-24 NOTE — Progress Notes (Signed)
Subjective:   Kaylee Reyes is a 68 y.o. female who presents for an Initial Medicare Annual Wellness Visit.    This visit is being conducted through telemedicine due to the COVID-19 pandemic. This patient has given me verbal consent via doximity to conduct this visit, patient states they are participating from their home address. Some vital signs may be absent or patient reported.    Patient identification: identified by name, DOB, and current address  Review of Systems    N/A   Cardiac Risk Factors include: advanced age (>74men, >29 women);hypertension     Objective:    Today's Vitals   07/25/19 1052 07/25/19 1054  Height: 5\' 6"  (1.676 m)   PainSc: 0-No pain 3    Body mass index is 35.98 kg/m. Unable to obtain vitals due to visit being conducted via telephonically.   Advanced Directives 07/25/2019 09/20/2018 07/10/2018 03/20/2018 09/19/2017 09/01/2017 06/29/2017  Does Patient Have a Medical Advance Directive? Yes Yes Yes Yes Yes Yes Yes  Type of Paramedic of [place];Living will [place];Living will [place];Living will [place];Living will [place];Living will [place];Living will [place];Living will  Does patient want to make changes to medical advance directive? - - No - Patient declined No - Patient declined No - Patient declined No - Patient declined No - Patient declined  Copy of [place] in Chart? No - copy requested No - copy requested No - copy requested No - copy requested No - copy requested No - copy requested No - copy requested    Current Medications (verified) Outpatient Encounter Medications as of 07/25/2019  Medication Sig  . atenolol (TENORMIN) 50 MG tablet Take 1 tablet (50 mg total) by mouth daily.  . B Complex-C (SUPER B COMPLEX PO) Take 1 tablet by mouth daily.  Marland Kitchen CALCIUM CITRATE PO Take 1  tablet by mouth daily.  Marland Kitchen docusate sodium (COLACE) 100 MG capsule Take 200 mg by mouth daily.   . hydrochlorothiazide (HYDRODIURIL) 25 MG tablet Take 1 tablet (25 mg total) by mouth daily.  Marland Kitchen ibuprofen (ADVIL,MOTRIN) 200 MG tablet Take 400 mg by mouth every 6 (six) hours as needed for mild pain.  Marland Kitchen ketoconazole (NIZORAL) 2 % cream Apply 1 application topically daily.  Marland Kitchen letrozole (FEMARA) 2.5 MG tablet Take 1 tablet (2.5 mg total) by mouth daily. ACCORD brand only  . levothyroxine (SYNTHROID, LEVOTHROID) 75 MCG tablet Take 1 tablet (75 mcg total) by mouth daily.  . Loratadine (CLARITIN) 10 MG CAPS Take by mouth.  . magic mouthwash SOLN 1 part 2% viscous lidocaine: 1 part Maalox: 1 part diphenhydramine 12.5 mg per 5 ml elixir. Rinse and spit 5 ml every six hour as needed  . Magnesium 400 MG TABS Take 600 mg by mouth daily.   . meclizine (ANTIVERT) 25 MG tablet TAKE 1 TABLET BY MOUTH EVERY 4 TO 6 HOURS AS NEEDED FOR NAUSEA  . Misc Natural Products (TURMERIC CURCUMIN) CAPS Take 1 capsule by mouth daily.  Marland Kitchen neomycin-polymyxin-hydrocortisone (CORTISPORIN) OTIC solution Place 3 drops into the right ear 3 (three) times daily. X 5-7 days  . nystatin (NYSTATIN) powder Apply topically 4 (four) times daily.  Marland Kitchen ofloxacin (OCUFLOX) 0.3 % ophthalmic solution Place 1 drop into the right ear 3 (three) times daily.  . Vitamin D, Ergocalciferol, (DRISDOL) 1.25 MG (50000 UT) CAPS capsule Take 1 capsule (50,000 Units total) by mouth once  a week.  . cyclobenzaprine (FLEXERIL) 5 MG tablet Take 1 tablet (5 mg total) by mouth 3 (three) times daily as needed for muscle spasms. (Patient not taking: Reported on 07/25/2019)   Facility-Administered Encounter Medications as of 07/25/2019  Medication  . sodium chloride flush (NS) 0.9 % injection 10 mL  . sodium chloride flush (NS) 0.9 % injection 10 mL    Allergies (verified) Penicillins, Nickel, and Pravastatin sodium   History: Past Medical History:  Diagnosis Date   . Anemia   . Cancer ([place]) 09/2015   breast cancer  . GERD (gastroesophageal reflux disease)   . History of chicken pox   . Hyperlipidemia   . Hypertension   . Hypothyroidism   . Neuropathy of foot    bilateral  . Pinched nerve   . PONV (postoperative nausea and vomiting)    explor[facility] surgery at [facility]  . Thyroid disease   . Tuberculosis exposure    Mother had TB.  Pt shows (+) on tests, but has never had.  . Vertigo    last episode over 1 yr ago  . Wears dentures    partial upper and lower   Past Surgical History:  Procedure Laterality Date  . ABDOMINAL HYSTERECTOMY  07/13/2013   Total. with BSO for postmenopausal bleeding and fibroids with large cervical polyp. Dr. Levy Sjogren and Dr. Glennon Mac at [facility]  . ABDOMINAL ULTRASOUND  04/17/2011   Hepatomegaly with borderline slenomegaly. Suggestive of fatty liver. s/p cholecystectomy. Portions of aorta obscured  . BREAST SURGERY Right 2011   BREAST BIOPSY  . CESAREAN SECTION  1988  . CHOLECYSTECTOMY  1990  . COLONOSCOPY WITH PROPOFOL N/A 09/01/2017   Procedure: COLONOSCOPY WITH PROPOFOL;  Surgeon: [name], MD;  Location: [place];  Service: Gastroenterology;  Laterality: N/A;  . EXPLORATORY LAPAROTOMY     Left breast  . MASTECTOMY W/ SENTINEL NODE BIOPSY Bilateral 10/28/2015   Procedure: MASTECTOMY WITH SENTINEL LYMPH NODE BIOPSY;  Surgeon: Hubbard Robinson, MD;  Location: [facility];  Service: General;  Laterality: Bilateral;  . PORT-A-CATH REMOVAL Left 10/11/2016   Procedure: REMOVAL PORT-A-CATH;  Surgeon: Hubbard Robinson, MD;  Location: [facility];  Service: General;  Laterality: Left;  . PORTACATH PLACEMENT Left 12/23/2015   Procedure: INSERTION PORT-A-CATH;  Surgeon: Hubbard Robinson, MD;  Location: [facility];  Service: General;  Laterality: Left;  . WRIST SURGERY     Family History  Problem Relation Age of Onset  . Hypertension Mother   . Thyroid disease Mother   . Hypertension Brother   . Diabetes Brother    . Breast cancer Neg Hx   . Ovarian cancer Neg Hx   . Colon cancer Neg Hx    Social History   Socioeconomic History  . Marital status: Married    Spouse name: Not on file  . Number of children: 2  . Years of education: Not on file  . Highest education level: Some college, no degree  Occupational History  . Occupation: retired  Scientific laboratory technician  . Financial resource strain: Not hard at all  . Food insecurity    Worry: Never true    Inability: Never true  . Transportation needs    Medical: No    Non-medical: No  Tobacco Use  . Smoking status: Never Smoker  . Smokeless tobacco: Never Used  Substance and Sexual Activity  . Alcohol use: No    Alcohol/week: 0.0 standard drinks  . Drug use: No  . Sexual activity: Never  Birth control/protection: Surgical  Lifestyle  . Physical activity    Days per week: 0 days    Minutes per session: 0 min  . Stress: Not at all  Relationships  . Social Herbalist on phone: Patient refused    Gets together: Patient refused    Attends religious service: Patient refused    Active member of club or organization: Patient refused    Attends meetings of clubs or organizations: Patient refused    Relationship status: Patient refused  Other Topics Concern  . Not on file  Social History Narrative  . Not on file    Tobacco Counseling Counseling given: Not Answered   Clinical Intake:  Pre-visit preparation completed: Yes  Pain : 0-10 Pain Score: 3  Pain Type: Chronic pain Pain Location: Back Pain Orientation: Lower Pain Descriptors / Indicators: Radiating Pain Frequency: Intermittent     Nutritional Risks: None Diabetes: No  How often do you need to have someone help you when you read instructions, pamphlets, or other written materials from your doctor or pharmacy?: 1 - Never  Interpreter Needed?: No  Information entered by :: [facility], LPN   Activities of Daily Living In your present state of health, do you have  any difficulty performing the following activities: 07/25/2019  Hearing? N  Vision? N  Difficulty concentrating or making decisions? N  Walking or climbing stairs? Y  Comment Due to back pains  Dressing or bathing? N  Doing errands, shopping? N  Preparing Food and eating ? N  Using the Toilet? N  In the past six months, have you accidently leaked urine? Y  Comment Occasionally with urges.  Do you have problems with loss of bowel control? N  Managing your Medications? N  Managing your Finances? N  Housekeeping or managing your Housekeeping? N  Some recent data might be hidden     Immunizations and Health Maintenance Immunization History  Administered Date(s) Administered  . Influenza Split 09/11/2010, 08/25/2011, 08/28/2013  . Tdap 08/25/2011   Health Maintenance Due  Topic Date Due  . PNA vac Low Risk Adult (1 of 2 - PCV13) 05/25/2017  . INFLUENZA VACCINE  07/07/2019    Patient Care Team: Birdie Sons, MD as PCP - General (Family Medicine) [name], MD as Consulting Physician (Gastroenterology) Diamond Nickel, DO as Consulting Physician (Sports Medicine) Sharlet Salina, MD as Referring Physician (Physical Medicine and Rehabilitation) Olean Ree, MD as Consulting Physician (General Surgery) Lloyd Huger, MD as Consulting Physician (Oncology) Noreene Filbert, MD as Referring Physician (Radiation Oncology)  Indicate any recent Medical Services you may have received from other than [facility] providers in the past year (date may be approximate).     Assessment:   This is a routine wellness examination for [place].  Hearing/Vision screen No exam data present  Dietary issues and exercise activities discussed: Current Exercise Habits: The patient does not participate in regular exercise at present, Exercise limited by: neurologic condition(s);orthopedic condition(s)(Neuropathy)  Goals    . DIET - INCREASE WATER INTAKE     Recommend to drink at least 6-8  8oz glasses of water per day.      Depression Screen PHQ 2/9 Scores 07/25/2019 11/10/2015  PHQ - 2 Score 0 0    Fall Risk Fall Risk  07/25/2019 11/10/2015  Falls in the past year? 0 No   FALL RISK PREVENTION PERTAINING TO THE HOME:  Any stairs in or around the home? Yes  If so, are there any  without handrails? No   Home free of loose throw rugs in walkways, pet beds, electrical cords, etc? Yes  Adequate lighting in your home to reduce risk of falls? Yes   ASSISTIVE DEVICES UTILIZED TO PREVENT FALLS:  Life alert? No  Use of a cane, walker or w/c? No  Grab bars in the bathroom? No  Shower chair or bench in shower? No  Elevated toilet seat or a handicapped toilet? Yes   TIMED UP AND GO:  Was the test performed? No .     Cognitive Function:        Screening Tests Health Maintenance  Topic Date Due  . PNA vac Low Risk Adult (1 of 2 - PCV13) 05/25/2017  . INFLUENZA VACCINE  07/07/2019  . TETANUS/TDAP  08/24/2021  . COLONOSCOPY  09/02/2027  . DEXA SCAN  Completed  . Hepatitis C Screening  Completed  . MAMMOGRAM  Discontinued    Qualifies for Shingles Vaccine? Yes . Due for Shingrix. Education has been provided regarding the importance of this vaccine. Pt has been advised to call insurance company to determine out of pocket expense. Advised may also receive vaccine at local pharmacy or Health Dept. Verbalized acceptance and understanding.  Tdap: Up to date, due 08/2021  Flu Vaccine: Due fall 2020  Pneumococcal Vaccine: Due for Pneumococcal vaccine. Does the patient want to receive this vaccine today?  No .   Cancer Screenings:  Colorectal Screening: Completed 09/01/17. Repeat every 10 years.  Mammogram: Completed 08/21/15. Mammogram no longer needed due to hx of mastectomy.   Bone Density: Completed 07/03/18. Results reflect NORMAL. No repeat needed unless indicated by the physician.  Lung Cancer Screening: (Low Dose CT Chest recommended if Age 47-80 years, 30  pack-year currently smoking OR have quit w/in 15years.) does not qualify.   Additional Screening:  Hepatitis C Screening: Up to date  Vision Screening: Recommended annual ophthalmology exams for early detection of glaucoma and other disorders of the eye.  Dental Screening: Recommended annual dental exams for proper oral hygiene  Community Resource Referral:  CRR required this visit?  No       Plan:  I have personally reviewed and addressed the Medicare Annual Wellness questionnaire and have noted the following in the patient's chart:  A. Medical and social history B. Use of alcohol, tobacco or illicit drugs  C. Current medications and supplements D. Functional ability and status E.  Nutritional status F.  Physical activity G. Advance directives H. List of other physicians I.  Hospitalizations, surgeries, and ER visits in previous 12 months J.  [place] such as hearing and vision if needed, cognitive and depression L. Referrals and appointments   In addition, I have reviewed and discussed with patient certain preventive protocols, quality metrics, and best practice recommendations. A written personalized care plan for preventive services as well as general preventive health recommendations were provided to patient.   [place] Score, [place]   5/99/3570  Nurse Health Advisor   Nurse Notes: Pt declined a future order for a influenza vaccine and a Prevnar 13 vaccine.

## 2019-07-25 ENCOUNTER — Ambulatory Visit (INDEPENDENT_AMBULATORY_CARE_PROVIDER_SITE_OTHER): Payer: Medicare Other

## 2019-07-25 ENCOUNTER — Other Ambulatory Visit: Payer: Self-pay

## 2019-07-25 VITALS — Ht 66.0 in

## 2019-07-25 DIAGNOSIS — Z0000 Encounter for general adult medical examination without abnormal findings: Secondary | ICD-10-CM | POA: Diagnosis not present

## 2019-07-25 NOTE — Patient Instructions (Signed)
Kaylee Reyes , Thank you for taking time to come for your Medicare Wellness Visit. I appreciate your ongoing commitment to your health goals. Please review the following plan we discussed and let me know if I can assist you in the future.   Screening recommendations/referrals: Colonoscopy: Up to date, due 08/2027 Mammogram: Not required due to hx of mastectomy.  Bone Density: Up to date, previous DEXA was normal. No repeat needed unless indicated by a physician.  Recommended yearly ophthalmology/optometry visit for glaucoma screening and checkup Recommended yearly dental visit for hygiene and checkup  Vaccinations: Influenza vaccine: Currently due Pneumococcal vaccine: Pt declines today.  Tdap vaccine: Up to date, due 08/2021 Shingles vaccine: Pt declines today.     Advanced directives: Please bring a copy of your POA (Power of Attorney) and/or Living Will to your next appointment.   Conditions/risks identified: Recommend to increase water intake to 6-8 8 oz glasses a day.  Next appointment: 08/06/19 @ 11:00 AM with Dr Caryn Section. Declined scheduling an AWV for 2021 at this time.    Preventive Care 22 Years and Older, Female Preventive care refers to lifestyle choices and visits with your health care provider that can promote health and wellness. What does preventive care include?  A yearly physical exam. This is also called an annual well check.  Dental exams once or twice a year.  Routine eye exams. Ask your health care provider how often you should have your eyes checked.  Personal lifestyle choices, including:  Daily care of your teeth and gums.  Regular physical activity.  Eating a healthy diet.  Avoiding tobacco and drug use.  Limiting alcohol use.  Practicing safe sex.  Taking low-dose aspirin every day.  Taking vitamin and mineral supplements as recommended by your health care provider. What happens during an annual well check? The services and screenings done by  your health care provider during your annual well check will depend on your age, overall health, lifestyle risk factors, and family history of disease. Counseling  Your health care provider may ask you questions about your:  Alcohol use.  Tobacco use.  Drug use.  Emotional well-being.  Home and relationship well-being.  Sexual activity.  Eating habits.  History of falls.  Memory and ability to understand (cognition).  Work and work Statistician.  Reproductive health. Screening  You may have the following tests or measurements:  Height, weight, and BMI.  Blood pressure.  Lipid and cholesterol levels. These may be checked every 5 years, or more frequently if you are over 32 years old.  Skin check.  Lung cancer screening. You may have this screening every year starting at age 2 if you have a 30-pack-year history of smoking and currently smoke or have quit within the past 15 years.  Fecal occult blood test (FOBT) of the stool. You may have this test every year starting at age 48.  Flexible sigmoidoscopy or colonoscopy. You may have a sigmoidoscopy every 5 years or a colonoscopy every 10 years starting at age 36.  Hepatitis C blood test.  Hepatitis B blood test.  Sexually transmitted disease (STD) testing.  Diabetes screening. This is done by checking your blood sugar (glucose) after you have not eaten for a while (fasting). You may have this done every 1-3 years.  Bone density scan. This is done to screen for osteoporosis. You may have this done starting at age 26.  Mammogram. This may be done every 1-2 years. Talk to your health care provider about how  often you should have regular mammograms. Talk with your health care provider about your test results, treatment options, and if necessary, the need for more tests. Vaccines  Your health care provider may recommend certain vaccines, such as:  Influenza vaccine. This is recommended every year.  Tetanus,  diphtheria, and acellular pertussis (Tdap, Td) vaccine. You may need a Td booster every 10 years.  Zoster vaccine. You may need this after age 82.  Pneumococcal 13-valent conjugate (PCV13) vaccine. One dose is recommended after age 79.  Pneumococcal polysaccharide (PPSV23) vaccine. One dose is recommended after age 29. Talk to your health care provider about which screenings and vaccines you need and how often you need them. This information is not intended to replace advice given to you by your health care provider. Make sure you discuss any questions you have with your health care provider. Document Released: 12/19/2015 Document Revised: 08/11/2016 Document Reviewed: 09/23/2015 Elsevier Interactive Patient Education  2017 [place] Prevention in the Home Falls can cause injuries. They can happen to people of all ages. There are many things you can do to make your home safe and to help prevent falls. What can I do on the outside of my home?  Regularly fix the edges of walkways and driveways and fix any cracks.  Remove anything that might make you trip as you walk through a door, such as a raised step or threshold.  Trim any bushes or trees on the path to your home.  Use bright outdoor lighting.  Clear any walking paths of anything that might make someone trip, such as rocks or tools.  Regularly check to see if handrails are loose or broken. Make sure that both sides of any steps have handrails.  Any raised decks and porches should have guardrails on the edges.  Have any leaves, snow, or ice cleared regularly.  Use sand or salt on walking paths during winter.  Clean up any spills in your garage right away. This includes oil or grease spills. What can I do in the bathroom?  Use night lights.  Install grab bars by the toilet and in the tub and shower. Do not use towel bars as grab bars.  Use non-skid mats or decals in the tub or shower.  If you need to sit down in  the shower, use a plastic, non-slip stool.  Keep the floor dry. Clean up any water that spills on the floor as soon as it happens.  Remove soap buildup in the tub or shower regularly.  Attach bath mats securely with double-sided non-slip rug tape.  Do not have throw rugs and other things on the floor that can make you trip. What can I do in the bedroom?  Use night lights.  Make sure that you have a light by your bed that is easy to reach.  Do not use any sheets or blankets that are too big for your bed. They should not hang down onto the floor.  Have a firm chair that has side arms. You can use this for support while you get dressed.  Do not have throw rugs and other things on the floor that can make you trip. What can I do in the kitchen?  Clean up any spills right away.  Avoid walking on wet floors.  Keep items that you use a lot in easy-to-reach places.  If you need to reach something above you, use a strong step stool that has a grab bar.  Keep electrical  cords out of the way.  Do not use floor polish or wax that makes floors slippery. If you must use wax, use non-skid floor wax.  Do not have throw rugs and other things on the floor that can make you trip. What can I do with my stairs?  Do not leave any items on the stairs.  Make sure that there are handrails on both sides of the stairs and use them. Fix handrails that are broken or loose. Make sure that handrails are as long as the stairways.  Check any carpeting to make sure that it is firmly attached to the stairs. Fix any carpet that is loose or worn.  Avoid having throw rugs at the top or bottom of the stairs. If you do have throw rugs, attach them to the floor with carpet tape.  Make sure that you have a light switch at the top of the stairs and the bottom of the stairs. If you do not have them, ask someone to add them for you. What else can I do to help prevent falls?  Wear shoes that:  Do not have high  heels.  Have rubber bottoms.  Are comfortable and fit you well.  Are closed at the toe. Do not wear sandals.  If you use a stepladder:  Make sure that it is fully opened. Do not climb a closed stepladder.  Make sure that both sides of the stepladder are locked into place.  Ask someone to hold it for you, if possible.  Clearly mark and make sure that you can see:  Any grab bars or handrails.  First and last steps.  Where the edge of each step is.  Use tools that help you move around (mobility aids) if they are needed. These include:  Canes.  Walkers.  Scooters.  Crutches.  Turn on the lights when you go into a dark area. Replace any light bulbs as soon as they burn out.  Set up your furniture so you have a clear path. Avoid moving your furniture around.  If any of your floors are uneven, fix them.  If there are any pets around you, be aware of where they are.  Review your medicines with your doctor. Some medicines can make you feel dizzy. This can increase your chance of falling. Ask your doctor what other things that you can do to help prevent falls. This information is not intended to replace advice given to you by your health care provider. Make sure you discuss any questions you have with your health care provider. Document Released: 09/18/2009 Document Revised: 04/29/2016 Document Reviewed: 12/27/2014 Elsevier Interactive Patient Education  2017 [employer].

## 2019-07-26 ENCOUNTER — Encounter: Payer: Self-pay | Admitting: Oncology

## 2019-07-26 ENCOUNTER — Inpatient Hospital Stay: Payer: Medicare Other | Attending: Oncology | Admitting: Oncology

## 2019-07-26 ENCOUNTER — Other Ambulatory Visit: Payer: Self-pay

## 2019-07-26 VITALS — BP 169/95 | HR 65 | Temp 97.3°F | Wt 226.0 lb

## 2019-07-26 DIAGNOSIS — E039 Hypothyroidism, unspecified: Secondary | ICD-10-CM | POA: Diagnosis not present

## 2019-07-26 DIAGNOSIS — Z90710 Acquired absence of both cervix and uterus: Secondary | ICD-10-CM | POA: Diagnosis not present

## 2019-07-26 DIAGNOSIS — Z90722 Acquired absence of ovaries, bilateral: Secondary | ICD-10-CM | POA: Diagnosis not present

## 2019-07-26 DIAGNOSIS — Z791 Long term (current) use of non-steroidal anti-inflammatories (NSAID): Secondary | ICD-10-CM | POA: Diagnosis not present

## 2019-07-26 DIAGNOSIS — I10 Essential (primary) hypertension: Secondary | ICD-10-CM | POA: Diagnosis not present

## 2019-07-26 DIAGNOSIS — E785 Hyperlipidemia, unspecified: Secondary | ICD-10-CM | POA: Diagnosis not present

## 2019-07-26 DIAGNOSIS — Z79811 Long term (current) use of aromatase inhibitors: Secondary | ICD-10-CM | POA: Insufficient documentation

## 2019-07-26 DIAGNOSIS — Z79899 Other long term (current) drug therapy: Secondary | ICD-10-CM | POA: Diagnosis not present

## 2019-07-26 DIAGNOSIS — Z9013 Acquired absence of bilateral breasts and nipples: Secondary | ICD-10-CM | POA: Insufficient documentation

## 2019-07-26 DIAGNOSIS — C50911 Malignant neoplasm of unspecified site of right female breast: Secondary | ICD-10-CM

## 2019-07-26 DIAGNOSIS — Z9079 Acquired absence of other genital organ(s): Secondary | ICD-10-CM | POA: Diagnosis not present

## 2019-07-26 DIAGNOSIS — Z170 Estrogen receptor positive status [ER+]: Secondary | ICD-10-CM | POA: Diagnosis not present

## 2019-07-26 DIAGNOSIS — C50811 Malignant neoplasm of overlapping sites of right female breast: Secondary | ICD-10-CM | POA: Insufficient documentation

## 2019-07-26 NOTE — Progress Notes (Signed)
Pt here for follow up, reporting increase in chronic back pain.

## 2019-07-31 DIAGNOSIS — M5136 Other intervertebral disc degeneration, lumbar region: Secondary | ICD-10-CM | POA: Diagnosis not present

## 2019-07-31 DIAGNOSIS — M48062 Spinal stenosis, lumbar region with neurogenic claudication: Secondary | ICD-10-CM | POA: Diagnosis not present

## 2019-07-31 DIAGNOSIS — M5416 Radiculopathy, lumbar region: Secondary | ICD-10-CM | POA: Diagnosis not present

## 2019-08-02 DIAGNOSIS — M5030 Other cervical disc degeneration, unspecified cervical region: Secondary | ICD-10-CM | POA: Diagnosis not present

## 2019-08-02 DIAGNOSIS — G8929 Other chronic pain: Secondary | ICD-10-CM | POA: Diagnosis not present

## 2019-08-02 DIAGNOSIS — M25511 Pain in right shoulder: Secondary | ICD-10-CM | POA: Diagnosis not present

## 2019-08-02 DIAGNOSIS — M7581 Other shoulder lesions, right shoulder: Secondary | ICD-10-CM | POA: Diagnosis not present

## 2019-08-02 DIAGNOSIS — M19011 Primary osteoarthritis, right shoulder: Secondary | ICD-10-CM | POA: Diagnosis not present

## 2019-08-02 DIAGNOSIS — M62838 Other muscle spasm: Secondary | ICD-10-CM | POA: Diagnosis not present

## 2019-08-02 DIAGNOSIS — M542 Cervicalgia: Secondary | ICD-10-CM | POA: Diagnosis not present

## 2019-08-02 DIAGNOSIS — M7541 Impingement syndrome of right shoulder: Secondary | ICD-10-CM | POA: Diagnosis not present

## 2019-08-03 ENCOUNTER — Other Ambulatory Visit: Payer: Self-pay | Admitting: Sports Medicine

## 2019-08-03 DIAGNOSIS — G8929 Other chronic pain: Secondary | ICD-10-CM

## 2019-08-03 DIAGNOSIS — M542 Cervicalgia: Secondary | ICD-10-CM

## 2019-08-03 DIAGNOSIS — M5030 Other cervical disc degeneration, unspecified cervical region: Secondary | ICD-10-CM

## 2019-08-06 ENCOUNTER — Other Ambulatory Visit: Payer: Self-pay

## 2019-08-06 ENCOUNTER — Ambulatory Visit (INDEPENDENT_AMBULATORY_CARE_PROVIDER_SITE_OTHER): Payer: Medicare Other | Admitting: Family Medicine

## 2019-08-06 ENCOUNTER — Encounter: Payer: Self-pay | Admitting: Family Medicine

## 2019-08-06 VITALS — BP 128/76 | HR 80 | Temp 96.9°F | Resp 16 | Wt 272.0 lb

## 2019-08-06 DIAGNOSIS — I10 Essential (primary) hypertension: Secondary | ICD-10-CM

## 2019-08-06 DIAGNOSIS — E039 Hypothyroidism, unspecified: Secondary | ICD-10-CM | POA: Diagnosis not present

## 2019-08-06 DIAGNOSIS — E7800 Pure hypercholesterolemia, unspecified: Secondary | ICD-10-CM

## 2019-08-06 NOTE — Progress Notes (Signed)
Patient: Kaylee Reyes Female    DOB: 1951/02/12   68 y.o.   MRN: [id number] Visit Date: 08/06/2019  Today's Provider: Lelon Huh, MD   Chief Complaint  Patient presents with  . Follow-up   Subjective:     HPI  Lipid/Cholesterol, Follow-up:   Last seen for this 2 years ago.  Management changes since that visit include starting Zetia. . Last Lipid Panel:    Component Value Date/Time   CHOL 248 (H) 04/04/2017 1121   TRIG 213 (H) 04/04/2017 1121   HDL 39 (L) 04/04/2017 1121   CHOLHDL 6.4 (H) 04/04/2017 1121   LDLCALC 166 (H) 04/04/2017 1121    Risk factors for vascular disease include hypercholesterolemia  She reports poor compliance with treatment. Patient did not start medication. Has done well with keto diet in the past, and just started back on it about 2 weeks ago.   She is not having side effects.  Current symptoms include none and have been stable. Weight trend: increasing steadily Prior visit with dietician: no Current diet: well balanced Current exercise: none  Wt Readings from Last 3 Encounters:  08/06/19 272 lb (123.4 kg)  07/26/19 226 lb (102.5 kg)  10/11/18 222 lb 14.4 oz (101.1 kg)    -------------------------------------------------------------------  Follow up for Hypothyroidism:  The patient was last seen for this 2 years ago. Changes made at last visit include none.  She reports good compliance with treatment. She feels that condition is Unchanged. She is not having side effects.   ------------------------------------------------------------------------------------  Hypertension, follow-up:  BP Readings from Last 3 Encounters:  08/06/19 128/76  07/26/19 (!) 169/95  10/11/18 110/73    She was last seen for hypertension 2 years ago.  BP at that visit was 132/78. Management since that visit includes no changes. She reports good compliance with treatment. She is not having side effects.  She is not exercising. She is  not adherent to low salt diet.   Outside blood pressures are checked. She is experiencing dyspnea.  Patient denies chest pain, chest pressure/discomfort, claudication, dyspnea, irregular heart beat, lower extremity edema, near-syncope, orthopnea, palpitations, paroxysmal nocturnal dyspnea, syncope and tachypnea.   Cardiovascular risk factors include advanced age (older than 35 for men, 69 for women), dyslipidemia and hypertension.  Use of agents associated with hypertension: thyroid hormones.     Weight trend: increasing steadily Wt Readings from Last 3 Encounters:  08/06/19 272 lb (123.4 kg)  07/26/19 226 lb (102.5 kg)  10/11/18 222 lb 14.4 oz (101.1 kg)    Current diet: well balanced  ------------------------------------------------------------------------  Allergies  Allergen Reactions  . Penicillins Shortness Of Breath    Has patient had a PCN reaction causing immediate rash, facial/tongue/throat swelling, SOB or lightheadedness with hypotension: Yes Has patient had a PCN reaction causing severe rash involving mucus membranes or skin necrosis: No Has patient had a PCN reaction that required hospitalization No Has patient had a PCN reaction occurring within the last 10 years: No If all of the above answers are "NO", then may proceed with Cephalosporin use.   . Nickel Hives  . Pravastatin Sodium Other (See Comments)    Myalgia      Current Outpatient Medications:  .  atenolol (TENORMIN) 50 MG tablet, Take 1 tablet (50 mg total) by mouth daily., Disp: 90 tablet, Rfl: 3 .  B Complex-C (SUPER B COMPLEX PO), Take 1 tablet by mouth daily., Disp: , Rfl:  .  CALCIUM CITRATE PO, Take 1  tablet by mouth daily., Disp: , Rfl:  .  docusate sodium (COLACE) 100 MG capsule, Take 200 mg by mouth daily. , Disp: , Rfl:  .  hydrochlorothiazide (HYDRODIURIL) 25 MG tablet, Take 1 tablet (25 mg total) by mouth daily., Disp: 90 tablet, Rfl: 4 .  ibuprofen (ADVIL,MOTRIN) 200 MG tablet, Take 400 mg  by mouth every 6 (six) hours as needed for mild pain., Disp: , Rfl:  .  ketoconazole (NIZORAL) 2 % cream, Apply 1 application topically daily., Disp: 50 g, Rfl: 3 .  letrozole (FEMARA) 2.5 MG tablet, Take 1 tablet (2.5 mg total) by mouth daily. ACCORD brand only, Disp: 90 tablet, Rfl: 3 .  levothyroxine (SYNTHROID, LEVOTHROID) 75 MCG tablet, Take 1 tablet (75 mcg total) by mouth daily., Disp: 90 tablet, Rfl: 3 .  Loratadine (CLARITIN) 10 MG CAPS, Take by mouth., Disp: , Rfl:  .  magic mouthwash SOLN, 1 part 2% viscous lidocaine: 1 part Maalox: 1 part diphenhydramine 12.5 mg per 5 ml elixir. Rinse and spit 5 ml every six hour as needed, Disp: 60 mL, Rfl: 1 .  Magnesium 400 MG TABS, Take 600 mg by mouth daily. , Disp: , Rfl:  .  meclizine (ANTIVERT) 25 MG tablet, TAKE 1 TABLET BY MOUTH EVERY 4 TO 6 HOURS AS NEEDED FOR NAUSEA, Disp: 30 tablet, Rfl: 5 .  Misc Natural Products (TURMERIC CURCUMIN) CAPS, Take 1 capsule by mouth daily., Disp: , Rfl:  .  nystatin (NYSTATIN) powder, Apply topically 4 (four) times daily., Disp: 15 g, Rfl: 3 .  Vitamin D, Ergocalciferol, (DRISDOL) 1.25 MG (50000 UT) CAPS capsule, Take 1 capsule (50,000 Units total) by mouth once a week., Disp: 12 capsule, Rfl: 0 No current facility-administered medications for this visit.   Facility-Administered Medications Ordered in Other Visits:  .  sodium chloride flush (NS) 0.9 % injection 10 mL, 10 mL, Intravenous, PRN, Lloyd Huger, MD, 10 mL at 02/09/16 0930 .  sodium chloride flush (NS) 0.9 % injection 10 mL, 10 mL, Intravenous, PRN, Lloyd Huger, MD, 10 mL at 06/21/16 0950  Review of Systems  Constitutional: Negative for appetite change, chills, fatigue and fever.  Respiratory: Positive for shortness of breath (with exertion). Negative for chest tightness.   Cardiovascular: Negative for chest pain and palpitations.  Gastrointestinal: Negative for abdominal pain, nausea and vomiting.  Neurological: Negative for  dizziness and weakness.    Social History   Tobacco Use  . Smoking status: Never Smoker  . Smokeless tobacco: Never Used  Substance Use Topics  . Alcohol use: No    Alcohol/week: 0.0 standard drinks      Objective:   BP 128/76 (BP Location: Left Arm, Patient Position: Sitting, Cuff Size: Large)   Pulse 80   Temp (!) 96.9 F (36.1 C) (Temporal)   Resp 16   Wt 272 lb (123.4 kg)   SpO2 98% Comment: room air  BMI 43.90 kg/m  Vitals:   08/06/19 1052  BP: 128/76  Pulse: 80  Resp: 16  Temp: (!) 96.9 F (36.1 C)  TempSrc: Temporal  SpO2: 98%  Weight: 272 lb (123.4 kg)  Body mass index is 43.9 kg/m.   Physical Exam  General Appearance:    Alert, cooperative, no distress, obese  Eyes:    PERRL, conjunctiva/corneas clear, EOM's intact       Lungs:     Clear to auscultation bilaterally, respirations unlabored  Heart:    Normal heart rate. Normal rhythm. No murmurs, rubs, or gallops.  MS:   All extremities are intact.   Neurologic:   Awake, alert, oriented x 3. No apparent focal neurological           defect.           Assessment & Plan    1. Hypothyroidism, unspecified type  - TSH  2. Essential hypertension Well controlled.  Continue current medications.   - Renal function panel  3. Hypercholesterolemia  - Comprehensive metabolic panel - Lipid panel  4. Morbid Obesity She has recently started back on low carb diet which she has done well on in the past.   The entirety of the information documented in the History of Present Illness, Review of Systems and Physical Exam were personally obtained by me. Portions of this information were initially documented by Meyer Cory, CMA and reviewed by me for thoroughness and accuracy.       Lelon Huh, MD  [facility]

## 2019-08-06 NOTE — Patient Instructions (Addendum)
.   Please review the attached list of medications and notify my office if there are any errors.   . Please bring all of your medications to every appointment so we can make sure that our medication list is the same as yours.   . It is especially important to get the annual flu vaccine this year. If you haven't had it already, please go to your pharmacy or call the office as soon as possible to schedule you flu shot.   . Please go to the lab draw station in [address] on the second floor of [facility]  when you are fasting for 8 hours. Normal hours are 8:00am to 12:30pm and 1:30pm to 4:00pm Monday through Friday

## 2019-08-15 ENCOUNTER — Other Ambulatory Visit: Payer: Self-pay

## 2019-08-15 ENCOUNTER — Ambulatory Visit
Admission: RE | Admit: 2019-08-15 | Discharge: 2019-08-15 | Disposition: A | Discharge status: Home / Self Care | Payer: Medicare Other | Source: Ambulatory Visit | Attending: Sports Medicine | Admitting: Sports Medicine

## 2019-08-15 DIAGNOSIS — G8929 Other chronic pain: Secondary | ICD-10-CM | POA: Diagnosis not present

## 2019-08-15 DIAGNOSIS — M25511 Pain in right shoulder: Secondary | ICD-10-CM | POA: Diagnosis not present

## 2019-08-15 DIAGNOSIS — M542 Cervicalgia: Secondary | ICD-10-CM

## 2019-08-15 DIAGNOSIS — M5030 Other cervical disc degeneration, unspecified cervical region: Secondary | ICD-10-CM | POA: Diagnosis not present

## 2019-08-16 ENCOUNTER — Encounter: Payer: Self-pay | Admitting: Family Medicine

## 2019-08-17 ENCOUNTER — Ambulatory Visit (INDEPENDENT_AMBULATORY_CARE_PROVIDER_SITE_OTHER): Payer: Medicare Other | Admitting: Family Medicine

## 2019-08-17 ENCOUNTER — Encounter: Payer: Self-pay | Admitting: Family Medicine

## 2019-08-17 ENCOUNTER — Other Ambulatory Visit: Payer: Self-pay

## 2019-08-17 VITALS — BP 126/70 | HR 74 | Resp 16 | Wt 274.0 lb

## 2019-08-17 DIAGNOSIS — Z9013 Acquired absence of bilateral breasts and nipples: Secondary | ICD-10-CM

## 2019-08-17 DIAGNOSIS — T148XXA Other injury of unspecified body region, initial encounter: Secondary | ICD-10-CM | POA: Diagnosis not present

## 2019-08-17 DIAGNOSIS — D1724 Benign lipomatous neoplasm of skin and subcutaneous tissue of left leg: Secondary | ICD-10-CM

## 2019-08-17 NOTE — Progress Notes (Signed)
Patient: Kaylee Reyes Female    DOB: [birth year]   68 y.o.   MRN: [id number] Visit Date: 08/17/2019  Today's Provider: Vernie Murders, PA   Chief Complaint  Patient presents with  . knot in left knee   Subjective:   HPI Patient comes in today c/o a knot in her left knee. She reports that she just noticed it a few days ago. She denies any pain or tenderness. She denies any injuries. She reports that it does has some bruising in the area that is raised. She has not taken anything OTC for her symptoms.   BP Readings from Last 3 Encounters:  08/17/19 126/70  08/06/19 128/76  07/26/19 (!) 169/95   Wt Readings from Last 3 Encounters:  08/17/19 274 lb (124.3 kg)  08/06/19 272 lb (123.4 kg)  07/26/19 226 lb (102.5 kg)   Past Medical History:  Diagnosis Date  . Anemia   . Cancer ([place]) 09/2015   breast cancer  . GERD (gastroesophageal reflux disease)   . History of chicken pox   . Hyperlipidemia   . Hypertension   . Hypothyroidism   . Neuropathy of foot    bilateral  . Pinched nerve   . PONV (postoperative nausea and vomiting)    exploratory surgery at [facility]  . Thyroid disease   . Tuberculosis exposure    Mother had TB.  Pt shows (+) on tests, but has never had.  . Vertigo    last episode over 1 yr ago  . Wears dentures    partial upper and lower   Past Surgical History:  Procedure Laterality Date  . ABDOMINAL HYSTERECTOMY  07/13/2013   Total. with BSO for postmenopausal bleeding and fibroids with large cervical polyp. Dr. Levy Sjogren and Dr. Glennon Mac at [facility]  . ABDOMINAL ULTRASOUND  04/17/2011   Hepatomegaly with borderline slenomegaly. Suggestive of fatty liver. s/p cholecystectomy. Portions of aorta obscured  . BREAST SURGERY Right 2011   BREAST BIOPSY  . CESAREAN SECTION  1988  . CHOLECYSTECTOMY  1990  . COLONOSCOPY WITH PROPOFOL N/A 09/01/2017   Procedure: COLONOSCOPY WITH PROPOFOL;  Surgeon: [name], MD;  Location: [facility];  Service:  Gastroenterology;  Laterality: N/A;  . EXPLORATORY LAPAROTOMY     Left breast  . MASTECTOMY W/ SENTINEL NODE BIOPSY Bilateral 10/28/2015   Procedure: MASTECTOMY WITH SENTINEL LYMPH NODE BIOPSY;  Surgeon: Hubbard Robinson, MD;  Location: [facility];  Service: General;  Laterality: Bilateral;  . PORT-A-CATH REMOVAL Left 10/11/2016   Procedure: REMOVAL PORT-A-CATH;  Surgeon: Hubbard Robinson, MD;  Location: [facility];  Service: General;  Laterality: Left;  . PORTACATH PLACEMENT Left 12/23/2015   Procedure: INSERTION PORT-A-CATH;  Surgeon: Hubbard Robinson, MD;  Location: [facility];  Service: General;  Laterality: Left;  . WRIST SURGERY     Family History  Problem Relation Age of Onset  . Hypertension Mother   . Thyroid disease Mother   . Hypertension Brother   . Diabetes Brother   . Breast cancer Neg Hx   . Ovarian cancer Neg Hx   . Colon cancer Neg Hx    Allergies  Allergen Reactions  . Penicillins Shortness Of Breath    Has patient had a PCN reaction causing immediate rash, facial/tongue/throat swelling, SOB or lightheadedness with hypotension: Yes Has patient had a PCN reaction causing severe rash involving mucus membranes or skin necrosis: No Has patient had a PCN reaction that required hospitalization No Has patient had  a PCN reaction occurring within the last 10 years: No If all of the above answers are "NO", then may proceed with Cephalosporin use.   . Nickel Hives  . Pravastatin Sodium Other (See Comments)    Myalgia     Current Outpatient Medications:  .  atenolol (TENORMIN) 50 MG tablet, Take 1 tablet (50 mg total) by mouth daily., Disp: 90 tablet, Rfl: 3 .  B Complex-C (SUPER B COMPLEX PO), Take 1 tablet by mouth daily., Disp: , Rfl:  .  CALCIUM CITRATE PO, Take 1 tablet by mouth daily., Disp: , Rfl:  .  docusate sodium (COLACE) 100 MG capsule, Take 200 mg by mouth daily. , Disp: , Rfl:  .  hydrochlorothiazide (HYDRODIURIL) 25 MG tablet, Take 1 tablet (25 mg total) by  mouth daily., Disp: 90 tablet, Rfl: 4 .  ibuprofen (ADVIL,MOTRIN) 200 MG tablet, Take 400 mg by mouth every 6 (six) hours as needed for mild pain., Disp: , Rfl:  .  ketoconazole (NIZORAL) 2 % cream, Apply 1 application topically daily., Disp: 50 g, Rfl: 3 .  letrozole (FEMARA) 2.5 MG tablet, Take 1 tablet (2.5 mg total) by mouth daily. ACCORD brand only, Disp: 90 tablet, Rfl: 3 .  levothyroxine (SYNTHROID, LEVOTHROID) 75 MCG tablet, Take 1 tablet (75 mcg total) by mouth daily., Disp: 90 tablet, Rfl: 3 .  Loratadine (CLARITIN) 10 MG CAPS, Take by mouth., Disp: , Rfl:  .  Magnesium 400 MG TABS, Take 600 mg by mouth daily. , Disp: , Rfl:  .  meclizine (ANTIVERT) 25 MG tablet, TAKE 1 TABLET BY MOUTH EVERY 4 TO 6 HOURS AS NEEDED FOR NAUSEA, Disp: 30 tablet, Rfl: 5 .  Misc Natural Products (TURMERIC CURCUMIN) CAPS, Take 1 capsule by mouth daily., Disp: , Rfl:  .  nystatin (NYSTATIN) powder, Apply topically 4 (four) times daily., Disp: 15 g, Rfl: 3 .  Vitamin D, Ergocalciferol, (DRISDOL) 1.25 MG (50000 UT) CAPS capsule, Take 1 capsule (50,000 Units total) by mouth once a week., Disp: 12 capsule, Rfl: 0 .  magic mouthwash SOLN, 1 part 2% viscous lidocaine: 1 part Maalox: 1 part diphenhydramine 12.5 mg per 5 ml elixir. Rinse and spit 5 ml every six hour as needed (Patient not taking: Reported on 08/17/2019), Disp: 60 mL, Rfl: 1 No current facility-administered medications for this visit.   Facility-Administered Medications Ordered in Other Visits:  .  sodium chloride flush (NS) 0.9 % injection 10 mL, 10 mL, Intravenous, PRN, Lloyd Huger, MD, 10 mL at 02/09/16 0930 .  sodium chloride flush (NS) 0.9 % injection 10 mL, 10 mL, Intravenous, PRN, Lloyd Huger, MD, 10 mL at 06/21/16 0950  Review of Systems  Constitutional: Negative for fever.  Respiratory: Negative for cough and shortness of breath.   Cardiovascular: Negative for chest pain, palpitations and leg swelling.  Musculoskeletal:  Negative for arthralgias and myalgias.  Skin: Positive for color change. Negative for pallor, rash and wound.   Social History   Tobacco Use  . Smoking status: Never Smoker  . Smokeless tobacco: Never Used  Substance Use Topics  . Alcohol use: No    Alcohol/week: 0.0 standard drinks     Objective:   BP 126/70   Pulse 74   Resp 16   Wt 274 lb (124.3 kg)   SpO2 97%   BMI 44.22 kg/m  Vitals:   08/17/19 1644  BP: 126/70  Pulse: 74  Resp: 16  SpO2: 97%  Weight: 274 lb (124.3 kg)  Body mass index is 44.22 kg/m.  Physical Exam Constitutional:      General: She is not in acute distress.    Appearance: She is well-developed.  HENT:     Head: Normocephalic and atraumatic.     Right Ear: Hearing normal.     Left Ear: Hearing normal.     Nose: Nose normal.  Eyes:     General: Lids are normal. No scleral icterus.       Right eye: No discharge.        Left eye: No discharge.     Conjunctiva/sclera: Conjunctivae normal.  Pulmonary:     Effort: Pulmonary effort is normal. No respiratory distress.  Musculoskeletal: Normal range of motion.  Skin:    Findings: Bruising and lesion present. No rash.     Comments: 1 cm bruise over a 6 cm lipoma medial side of the left knee. Multiple large varicose veins in the left lower leg. No pain in the joint or palpation of the lipoma.  Neurological:     Mental Status: She is alert and oriented to person, place, and time.  Psychiatric:        Speech: Speech normal.        Behavior: Behavior normal.        Thought Content: Thought content normal.       Assessment & Plan    1. Bruise Patient noticed a bruise over a lipoma on the left knee yesterday. A little worried that it has anything to do with her history of breast cancer. Seems to be a 1 cm bruise on top of a lipoma. Advised to monitor for additional changes and recheck as needed.  2. Lipoma of left lower extremity Six centimeter soft fat pad over the medial side of the left knee.  No tenderness or local lymphadenopathy.   3. S/P bilateral mastectomy Bilateral mastectomy with history of invasive lobular carcinoma of the right breast in 2016. In her 3rd year of Letrozole treatment. No recurrences found on follow up by her surgeon and oncologist.     Vernie Murders, [place]

## 2019-08-21 ENCOUNTER — Other Ambulatory Visit: Payer: Self-pay

## 2019-08-22 ENCOUNTER — Other Ambulatory Visit: Payer: Self-pay

## 2019-08-22 ENCOUNTER — Encounter: Payer: Self-pay | Admitting: Radiation Oncology

## 2019-08-22 ENCOUNTER — Ambulatory Visit
Admission: RE | Admit: 2019-08-22 | Discharge: 2019-08-22 | Disposition: A | Discharge status: Home / Self Care | Payer: Medicare Other | Source: Ambulatory Visit | Attending: Radiation Oncology | Admitting: Radiation Oncology

## 2019-08-22 VITALS — BP 154/81 | HR 66 | Temp 97.6°F | Wt 277.4 lb

## 2019-08-22 DIAGNOSIS — M479 Spondylosis, unspecified: Secondary | ICD-10-CM | POA: Diagnosis not present

## 2019-08-22 DIAGNOSIS — Z923 Personal history of irradiation: Secondary | ICD-10-CM | POA: Diagnosis not present

## 2019-08-22 DIAGNOSIS — Z79811 Long term (current) use of aromatase inhibitors: Secondary | ICD-10-CM | POA: Insufficient documentation

## 2019-08-22 DIAGNOSIS — Z170 Estrogen receptor positive status [ER+]: Secondary | ICD-10-CM | POA: Insufficient documentation

## 2019-08-22 DIAGNOSIS — Z9013 Acquired absence of bilateral breasts and nipples: Secondary | ICD-10-CM | POA: Diagnosis not present

## 2019-08-22 DIAGNOSIS — C50811 Malignant neoplasm of overlapping sites of right female breast: Secondary | ICD-10-CM | POA: Diagnosis not present

## 2019-08-22 DIAGNOSIS — C50911 Malignant neoplasm of unspecified site of right female breast: Secondary | ICD-10-CM

## 2019-08-22 NOTE — Progress Notes (Signed)
Radiation Oncology Follow up Note  Name: Kaylee Reyes   Date:   08/22/2019 MRN:  [id number] DOB: [birth year]    This 68 y.o. 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  REFERRING PROVIDER: Birdie Sons, MD  HPI: Patient is a 68 year old female status post bilateral mastectomies.  She had stage IIb ER PR positive invasive mammary carcinoma of the right breast and she underwent adjuvant rate right chest wall peripheral emphatic radiation seen today in routine follow-up she is doing well.  She specifically denies any new chest wall nodularity or masses.  She is having no swelling in her upper extremities..  She is currently on letrozole tolerating that well without side effect.  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  On my review there is no evidence of metastatic disease.  COMPLICATIONS OF TREATMENT: none  FOLLOW UP COMPLIANCE: keeps appointments   PHYSICAL EXAM:  BP (!) 154/81   Pulse 66   Temp 97.6 F (36.4 C)   Wt 277 lb 6.4 oz (125.8 kg)   BMI 44.77 kg/m  Morbidly obese female in NAD.  Patient is patent status post bilateral mastectomies no evidence of chest wall mass or nodularity in either chest wall is noted.  No axillary or supraclavicular adenopathy is appreciated.  Well-developed well-nourished patient in NAD. HEENT reveals PERLA, EOMI, discs not visualized.  Oral cavity is clear. No oral mucosal lesions are identified. Neck is clear without evidence of cervical or supraclavicular adenopathy. Lungs are clear to A&P. Cardiac examination is essentially unremarkable with regular rate and rhythm without murmur rub or thrill. Abdomen is benign with no organomegaly or masses noted. Motor sensory and DTR  levels are equal and symmetric in the upper and lower extremities. Cranial nerves II through XII are grossly intact. Proprioception is intact. No peripheral adenopathy or edema is identified. No motor or sensory levels are noted. Crude visual fields are within normal range.  RADIOLOGY RESULTS: MRI of cervical spine reviewed compatible with above-stated findings  PLAN: Present time patient continues to do well with no evidence of disease 3 years out.  Based on the fact she does not need annual breast examination status post bilateral mastectomies I am going to turn follow-up care over to both medical oncology and her primary care and GYN doctor.  I be happy to reevaluate this patient anytime should further radiation oncology opinion be needed.  Patient continues on letrozole without side effect.  I would like to take this opportunity to thank you for allowing me to participate in the care of your patient.Noreene Filbert, MD

## 2019-08-28 ENCOUNTER — Telehealth: Payer: Self-pay | Admitting: *Deleted

## 2019-08-28 DIAGNOSIS — M5416 Radiculopathy, lumbar region: Secondary | ICD-10-CM | POA: Diagnosis not present

## 2019-08-28 DIAGNOSIS — M5136 Other intervertebral disc degeneration, lumbar region: Secondary | ICD-10-CM | POA: Diagnosis not present

## 2019-08-28 DIAGNOSIS — M47812 Spondylosis without myelopathy or radiculopathy, cervical region: Secondary | ICD-10-CM | POA: Diagnosis not present

## 2019-08-28 DIAGNOSIS — M5412 Radiculopathy, cervical region: Secondary | ICD-10-CM | POA: Diagnosis not present

## 2019-08-28 DIAGNOSIS — M48062 Spinal stenosis, lumbar region with neurogenic claudication: Secondary | ICD-10-CM | POA: Diagnosis not present

## 2019-08-28 DIAGNOSIS — M5030 Other cervical disc degeneration, unspecified cervical region: Secondary | ICD-10-CM | POA: Diagnosis not present

## 2019-08-28 NOTE — Telephone Encounter (Signed)
Patient called reporting that her other doctor ordered her Prednisone for 6 days and was tod to be sure it is alright for her to take it with her caner medicine. Please advise

## 2019-08-28 NOTE — Telephone Encounter (Signed)
Yes, that is fine. 

## 2019-08-28 NOTE — Telephone Encounter (Signed)
Patient informed ok to take Prednisone 

## 2019-09-14 ENCOUNTER — Telehealth: Payer: Self-pay | Admitting: Family Medicine

## 2019-09-14 NOTE — Telephone Encounter (Signed)
Pt has bug bits from flying around insects. Asking if something could be called in for her.  She tried over the counter meds that has not helped relieve the itching.    Pt uses:   [id number] - [address], [place] AT [address] [phone] (Phone) [phone] (Fax)   Thanks, [employer]

## 2019-09-17 MED ORDER — TRIAMCINOLONE ACETONIDE 0.5 % EX CREA: 1.0000 "application " | TOPICAL_CREAM | Freq: Two times a day (BID) | CUTANEOUS | 0 refills | Status: DC | PRN

## 2019-09-25 ENCOUNTER — Other Ambulatory Visit: Payer: Self-pay | Admitting: Family Medicine

## 2019-09-28 ENCOUNTER — Ambulatory Visit: Payer: Medicare Other | Admitting: Family Medicine

## 2019-09-28 DIAGNOSIS — H01119 Allergic dermatitis of unspecified eye, unspecified eyelid: Secondary | ICD-10-CM | POA: Diagnosis not present

## 2019-09-28 DIAGNOSIS — H04123 Dry eye syndrome of bilateral lacrimal glands: Secondary | ICD-10-CM | POA: Diagnosis not present

## 2019-09-28 DIAGNOSIS — H2513 Age-related nuclear cataract, bilateral: Secondary | ICD-10-CM | POA: Diagnosis not present

## 2019-09-28 DIAGNOSIS — H1045 Other chronic allergic conjunctivitis: Secondary | ICD-10-CM | POA: Diagnosis not present

## 2019-10-02 DIAGNOSIS — B009 Herpesviral infection, unspecified: Secondary | ICD-10-CM | POA: Diagnosis not present

## 2019-10-09 ENCOUNTER — Ambulatory Visit (INDEPENDENT_AMBULATORY_CARE_PROVIDER_SITE_OTHER): Payer: Medicare Other | Admitting: Family Medicine

## 2019-10-09 ENCOUNTER — Encounter: Payer: Self-pay | Admitting: Family Medicine

## 2019-10-09 VITALS — Temp 98.7°F

## 2019-10-09 DIAGNOSIS — R509 Fever, unspecified: Secondary | ICD-10-CM | POA: Diagnosis not present

## 2019-10-09 DIAGNOSIS — M791 Myalgia, unspecified site: Secondary | ICD-10-CM | POA: Diagnosis not present

## 2019-10-09 DIAGNOSIS — J3489 Other specified disorders of nose and nasal sinuses: Secondary | ICD-10-CM

## 2019-10-09 NOTE — Progress Notes (Signed)
Patient: Kaylee Reyes Female    DOB: 1951/10/12   68 y.o.   MRN: [id number] Visit Date: 10/09/2019  Today's Provider: Lelon Huh, MD   Chief Complaint  Patient presents with   URI   Subjective:    Virtual Visit via Telephone Note  I connected with Kaylee Reyes on 10/09/19 at 10:00 AM EST by telephone and verified that I am speaking with the correct person using two identifiers.  Location: Patient: Home Provider: [facility]   I discussed the limitations, risks, security and privacy concerns of performing an evaluation and management service by telephone and the availability of in person appointments. I also discussed with the patient that there may be a patient responsible charge related to this service. The patient expressed understanding and agreed to proceed.  URI  This is a new problem. The current episode started yesterday. The problem has been unchanged. Maximum temperature: 99.9. The fever has been present for less than 1 day. Associated symptoms include headaches and nausea. Associated symptoms comments: Nasal congestion and dry eyes. Treatments tried: Meclizine. The treatment provided mild relief.  She is concerned she may have Covid  Allergies  Allergen Reactions   Penicillins Shortness Of Breath    Has patient had a PCN reaction causing immediate rash, facial/tongue/throat swelling, SOB or lightheadedness with hypotension: Yes Has patient had a PCN reaction causing severe rash involving mucus membranes or skin necrosis: No Has patient had a PCN reaction that required hospitalization No Has patient had a PCN reaction occurring within the last 10 years: No If all of the above answers are "NO", then may proceed with Cephalosporin use.    Nickel Hives   Pravastatin Sodium Other (See Comments)    Myalgia      Current Outpatient Medications:    atenolol (TENORMIN) 50 MG tablet, TAKE 1 TABLET DAILY, Disp: 90 tablet, Rfl: 3   B Complex-C (SUPER B COMPLEX  PO), Take 1 tablet by mouth daily., Disp: , Rfl:    CALCIUM CITRATE PO, Take 1 tablet by mouth daily., Disp: , Rfl:    docusate sodium (COLACE) 100 MG capsule, Take 200 mg by mouth daily. , Disp: , Rfl:    hydrochlorothiazide (HYDRODIURIL) 25 MG tablet, Take 1 tablet (25 mg total) by mouth daily., Disp: 90 tablet, Rfl: 4   ibuprofen (ADVIL,MOTRIN) 200 MG tablet, Take 400 mg by mouth every 6 (six) hours as needed for mild pain., Disp: , Rfl:    ketoconazole (NIZORAL) 2 % cream, Apply 1 application topically daily., Disp: 50 g, Rfl: 3   letrozole (FEMARA) 2.5 MG tablet, TAKE 1 TABLET DAILY (ACCORD BRAND ONLY), Disp: 90 tablet, Rfl: 3   levothyroxine (SYNTHROID) 75 MCG tablet, TAKE 1 TABLET DAILY, Disp: 90 tablet, Rfl: 3   Loratadine (CLARITIN) 10 MG CAPS, Take by mouth., Disp: , Rfl:    Magnesium 400 MG TABS, Take 600 mg by mouth daily. , Disp: , Rfl:    meclizine (ANTIVERT) 25 MG tablet, TAKE 1 TABLET BY MOUTH EVERY 4 TO 6 HOURS AS NEEDED FOR NAUSEA, Disp: 30 tablet, Rfl: 5   Misc Natural Products (TURMERIC CURCUMIN) CAPS, Take 1 capsule by mouth daily., Disp: , Rfl:    nystatin (NYSTATIN) powder, Apply topically 4 (four) times daily., Disp: 15 g, Rfl: 3   Olopatadine HCl (PATADAY OP), Apply to eye., Disp: , Rfl:    triamcinolone cream (KENALOG) 0.5 %, Apply 1 application topically 2 (two) times daily as needed. For  itching, Disp: 30 g, Rfl: 0   Vitamin D, Ergocalciferol, (DRISDOL) 1.25 MG (50000 UT) CAPS capsule, Take 1 capsule (50,000 Units total) by mouth once a week., Disp: 12 capsule, Rfl: 0 No current facility-administered medications for this visit.   Facility-Administered Medications Ordered in Other Visits:    sodium chloride flush (NS) 0.9 % injection 10 mL, 10 mL, Intravenous, PRN, Lloyd Huger, MD, 10 mL at 02/09/16 0930   sodium chloride flush (NS) 0.9 % injection 10 mL, 10 mL, Intravenous, PRN, Lloyd Huger, MD, 10 mL at 06/21/16 0950  Review of  Systems  Constitutional: Negative.   Eyes:       Dry eyes   Respiratory: Negative.   Cardiovascular: Negative.   Gastrointestinal: Positive for nausea.  Musculoskeletal: Negative.   Neurological: Positive for headaches.    Social History   Tobacco Use   Smoking status: Never Smoker   Smokeless tobacco: Never Used  Substance Use Topics   Alcohol use: No    Alcohol/week: 0.0 standard drinks      Objective:   Temp 98.7 F (37.1 C) (Oral)    Physical Exam   No results found for any visits on 10/09/19.     Assessment & Plan       1. Fever, unspecified fever cause Advised of high false negative test rate within the first 48-72 hours of symptoms. She will wait until 10-11-2019 to have test done, but is to call or go to ER if there is any worsening of symptoms. Is to quarantine until test results are back.  - Novel Coronavirus, NAA ([facility])  2. Myalgia  - Novel Coronavirus, NAA ([facility])  3. Sinus drainage  - Novel Coronavirus, NAA ([facility])    I discussed the assessment and treatment plan with the patient. The patient was provided an opportunity to ask questions and all were answered. The patient agreed with the plan and demonstrated an understanding of the instructions.   The patient was advised to call back or seek an in-person evaluation if the symptoms worsen or if the condition fails to improve as anticipated.  I provided 10 minutes of non-face-to-face time during this encounter.    The entirety of the information documented in the History of Present Illness, Review of Systems and Physical Exam were personally obtained by me. Portions of this information were initially documented by Idelle Jo, CMA and reviewed by me for thoroughness and accuracy.     Lelon Huh, MD  [facility]

## 2019-10-09 NOTE — Patient Instructions (Signed)
.   Please review the attached list of medications and notify my office if there are any errors.   . Please bring all of your medications to every appointment so we can make sure that our medication list is the same as yours.   . It is especially important to get the annual flu vaccine this year. If you haven't had it already, please go to your pharmacy or call the office as soon as possible to schedule you flu shot.  

## 2019-10-11 ENCOUNTER — Other Ambulatory Visit: Payer: Self-pay

## 2019-10-11 DIAGNOSIS — Z20822 Contact with and (suspected) exposure to covid-19: Secondary | ICD-10-CM

## 2019-10-11 DIAGNOSIS — Z20828 Contact with and (suspected) exposure to other viral communicable diseases: Secondary | ICD-10-CM | POA: Diagnosis not present

## 2019-10-13 LAB — NOVEL CORONAVIRUS, NAA: SARS-CoV-2, NAA: NOT DETECTED

## 2019-10-15 ENCOUNTER — Encounter: Payer: Medicare Other | Admitting: Obstetrics and Gynecology

## 2019-10-16 ENCOUNTER — Encounter: Payer: Medicare Other | Admitting: Obstetrics and Gynecology

## 2019-10-17 ENCOUNTER — Telehealth (INDEPENDENT_AMBULATORY_CARE_PROVIDER_SITE_OTHER): Payer: Medicare Other | Admitting: Physician Assistant

## 2019-10-17 ENCOUNTER — Encounter: Payer: Self-pay | Admitting: Physician Assistant

## 2019-10-17 VITALS — Wt 276.0 lb

## 2019-10-17 DIAGNOSIS — R062 Wheezing: Secondary | ICD-10-CM | POA: Diagnosis not present

## 2019-10-17 MED ORDER — ALBUTEROL SULFATE HFA 108 (90 BASE) MCG/ACT IN AERS: 2.0000 | INHALATION_SPRAY | Freq: Four times a day (QID) | RESPIRATORY_TRACT | 0 refills | Status: DC | PRN

## 2019-10-17 NOTE — Progress Notes (Signed)
Patient: Kaylee Reyes Female    DOB: 1951-08-19   67 y.o.   MRN: [id number] Visit Date: 10/17/2019  Today's Provider: Trinna Post, PA-C   Chief Complaint  Patient presents with  . Sinus Problem   Subjective:    Virtual Visit via Video Note  I connected with Kaylee Reyes on 10/17/19 at  3:00 PM EST by a video enabled telemedicine application and verified that I am speaking with the correct person using two identifiers.  Location: Patient: Home Provider: Office   Interactive audio and video communications were attempted, although failed due to patient's inability to connect to video. Continued visit with audio only interaction with patient agreement.  I discussed the limitations of evaluation and management by telemedicine and the availability of in person appointments. The patient expressed understanding and agreed to proceed.  Sinus Problem This is a new problem. The current episode started in the past 7 days. There has been no fever. Associated symptoms include shortness of breath and sinus pressure. Pertinent negatives include no chills, congestion, coughing, diaphoresis, ear pain, headaches, hoarse voice, neck pain, sneezing, sore throat or swollen glands. Past treatments include acetaminophen (IBUPROFEN). The treatment provided mild relief.   Reports her eye doctor had her on allergy drops for dryness of her eyes for a week. Has noticed PND at night. Last week she a low grade fever, headache, nausea. She tested negative for COVID on 10/11/2019. Reports yesterday she did not have a fever, nausea, or headache. She reports some slight SOB.    Allergies  Allergen Reactions  . Penicillins Shortness Of Breath    Has patient had a PCN reaction causing immediate rash, facial/tongue/throat swelling, SOB or lightheadedness with hypotension: Yes Has patient had a PCN reaction causing severe rash involving mucus membranes or skin necrosis: No Has patient had a PCN  reaction that required hospitalization No Has patient had a PCN reaction occurring within the last 10 years: No If all of the above answers are "NO", then may proceed with Cephalosporin use.   . Nickel Hives  . Pravastatin Sodium Other (See Comments)    Myalgia      Current Outpatient Medications:  .  atenolol (TENORMIN) 50 MG tablet, TAKE 1 TABLET DAILY, Disp: 90 tablet, Rfl: 3 .  B Complex-C (SUPER B COMPLEX PO), Take 1 tablet by mouth daily., Disp: , Rfl:  .  CALCIUM CITRATE PO, Take 1 tablet by mouth daily., Disp: , Rfl:  .  docusate sodium (COLACE) 100 MG capsule, Take 200 mg by mouth daily. , Disp: , Rfl:  .  ELDERBERRY PO, Take by mouth., Disp: , Rfl:  .  hydrochlorothiazide (HYDRODIURIL) 25 MG tablet, Take 1 tablet (25 mg total) by mouth daily., Disp: 90 tablet, Rfl: 4 .  ibuprofen (ADVIL,MOTRIN) 200 MG tablet, Take 400 mg by mouth every 6 (six) hours as needed for mild pain., Disp: , Rfl:  .  ketoconazole (NIZORAL) 2 % cream, Apply 1 application topically daily., Disp: 50 g, Rfl: 3 .  letrozole (FEMARA) 2.5 MG tablet, TAKE 1 TABLET DAILY (ACCORD BRAND ONLY), Disp: 90 tablet, Rfl: 3 .  levothyroxine (SYNTHROID) 75 MCG tablet, TAKE 1 TABLET DAILY, Disp: 90 tablet, Rfl: 3 .  Loratadine (CLARITIN) 10 MG CAPS, Take by mouth., Disp: , Rfl:  .  Magnesium 400 MG TABS, Take 600 mg by mouth daily. , Disp: , Rfl:  .  meclizine (ANTIVERT) 25 MG tablet, TAKE 1 TABLET BY MOUTH EVERY  4 TO 6 HOURS AS NEEDED FOR NAUSEA, Disp: 30 tablet, Rfl: 5 .  Misc Natural Products (TURMERIC CURCUMIN) CAPS, Take 1 capsule by mouth daily., Disp: , Rfl:  .  nystatin (NYSTATIN) powder, Apply topically 4 (four) times daily., Disp: 15 g, Rfl: 3 .  Olopatadine HCl (PATADAY OP), Apply to eye., Disp: , Rfl:  .  triamcinolone cream (KENALOG) 0.5 %, Apply 1 application topically 2 (two) times daily as needed. For itching, Disp: 30 g, Rfl: 0 .  Vitamin D, Ergocalciferol, (DRISDOL) 1.25 MG (50000 UT) CAPS capsule, Take 1  capsule (50,000 Units total) by mouth once a week., Disp: 12 capsule, Rfl: 0 No current facility-administered medications for this visit.   Facility-Administered Medications Ordered in Other Visits:  .  sodium chloride flush (NS) 0.9 % injection 10 mL, 10 mL, Intravenous, PRN, Lloyd Huger, MD, 10 mL at 02/09/16 0930 .  sodium chloride flush (NS) 0.9 % injection 10 mL, 10 mL, Intravenous, PRN, Lloyd Huger, MD, 10 mL at 06/21/16 0950  Review of Systems  Constitutional: Negative.  Negative for chills and diaphoresis.  HENT: Positive for postnasal drip and sinus pressure. Negative for congestion, ear pain, hoarse voice, sneezing and sore throat.   Respiratory: Positive for shortness of breath. Negative for cough.   Gastrointestinal: Positive for nausea.  Endocrine: Negative.   Musculoskeletal: Negative for neck pain.  Neurological: Negative for headaches.    Social History   Tobacco Use  . Smoking status: Never Smoker  . Smokeless tobacco: Never Used  Substance Use Topics  . Alcohol use: No    Alcohol/week: 0.0 standard drinks      Objective:   Wt 276 lb (125.2 kg) Comment: patient reports  BMI 44.55 kg/m  Vitals:   10/17/19 1420  Weight: 276 lb (125.2 kg)  Body mass index is 44.55 kg/m.   Physical Exam   No results found for any visits on 10/17/19.     Assessment & Plan    1. Wheezing  Discussed symptomatic treatment including switching claritin to Xyzal and inhaler as below. Follow up precautions counseled.   - albuterol (VENTOLIN HFA) 108 (90 Base) MCG/ACT inhaler; Inhale 2 puffs into the lungs every 6 (six) hours as needed for wheezing or shortness of breath.  Dispense: 6.7 g; Refill: 0 I discussed the assessment and treatment plan with the patient. The patient was provided an opportunity to ask questions and all were answered. The patient agreed with the plan and demonstrated an understanding of the instructions.   The patient was advised to call  back or seek an in-person evaluation if the symptoms worsen or if the condition fails to improve as anticipated.  I provided 15 minutes of non-face-to-face time during this encounter.     Trinna Post, PA-C  [facility] Patient seen by Carles Collet, PA......, note scribed by Jennings Books, [place]

## 2019-10-19 DIAGNOSIS — H16102 Unspecified superficial keratitis, left eye: Secondary | ICD-10-CM | POA: Diagnosis not present

## 2019-10-19 DIAGNOSIS — H16223 Keratoconjunctivitis sicca, not specified as Sjogren's, bilateral: Secondary | ICD-10-CM | POA: Diagnosis not present

## 2019-11-02 ENCOUNTER — Other Ambulatory Visit: Payer: Self-pay | Admitting: Family Medicine

## 2019-11-02 NOTE — Telephone Encounter (Signed)
Requested medication (s) are due for refill today: yes  Requested medication (s) are on the active medication list: yes  Last refill:  07/14/2019  Future visit scheduled: no  Notes to clinic:  Refill cannot be delegated    Requested Prescriptions  Pending Prescriptions Disp Refills   meclizine (ANTIVERT) 25 MG tablet [Pharmacy Med Name: MECLIZINE 25MG  RX TABLETS] 30 tablet 5    Sig: TAKE 1 TABLET BY MOUTH EVERY 4 TO 6 HOURS AS NEEDED FOR NAUSEA     Not Delegated - Gastroenterology: Antiemetics Failed - 11/02/2019  8:06 AM      Failed - This refill cannot be delegated      Passed - Valid encounter within last 6 months    Recent Outpatient Visits          2 weeks ago [place], Adriana M, PA-C   3 weeks ago Fever, unspecified fever cause   [facility] Birdie Sons, MD   2 months ago [place], [place], [place]   2 months ago Hypothyroidism, unspecified type   [facility] Birdie Sons, MD   3 months ago Other infective acute otitis externa of right ear   [facility] [place], Clearnce Sorrel, [place]      Future Appointments            In 4 days Amalia Hailey, Nyoka Lint, MD [facility]   In 3 months [place] Ormond, Kathlene November, MD [facility]

## 2019-11-06 ENCOUNTER — Other Ambulatory Visit: Payer: Self-pay | Admitting: Oncology

## 2019-11-06 ENCOUNTER — Encounter: Payer: Medicare Other | Admitting: Obstetrics and Gynecology

## 2019-11-10 ENCOUNTER — Other Ambulatory Visit: Payer: Self-pay | Admitting: Physician Assistant

## 2019-11-10 DIAGNOSIS — R062 Wheezing: Secondary | ICD-10-CM

## 2019-11-10 NOTE — Telephone Encounter (Signed)
Requested medication (s) are due for refill today: yes  Requested medication (s) are on the active medication list: yes  Last refill:  10/18/2019  Future visit scheduled: no  Notes to clinic:  One inhaler should last one month Review for refill  Requested Prescriptions  Pending Prescriptions Disp Refills   albuterol (VENTOLIN HFA) 108 (90 Base) MCG/ACT inhaler [Pharmacy Med Name: ALBUTEROL HFA INH (200 PUFFS)6.7GM] 6.7 g 0    Sig: INHALE 2 PUFFS BY MOUTH EVERY 6 HOURS AS NEEDED WHEEZING OR SHORTNESS OF BREATH     Pulmonology:  Beta Agonists Failed - 11/10/2019  3:17 AM      Failed - One inhaler should last at least one month. If the patient is requesting refills earlier, contact the patient to check for uncontrolled symptoms.      Passed - Valid encounter within last 12 months    Recent Outpatient Visits          3 weeks ago [place], [place], [place]   1 month ago Fever, unspecified fever cause   [facility] Birdie Sons, MD   2 months ago [place], [place], [place]   3 months ago Hypothyroidism, unspecified type   [facility] Birdie Sons, MD   4 months ago Other infective acute otitis externa of right ear   [place], Clearnce Sorrel, [place]      Future Appointments            In 1 month Amalia Hailey, Nyoka Lint, MD [facility]   In 2 months Grayland Ormond, Kathlene November, MD [facility]

## 2019-11-14 ENCOUNTER — Encounter: Payer: Self-pay | Admitting: Family Medicine

## 2019-11-16 ENCOUNTER — Ambulatory Visit: Payer: Self-pay | Admitting: *Deleted

## 2019-11-16 ENCOUNTER — Telehealth: Payer: Self-pay | Admitting: *Deleted

## 2019-11-16 DIAGNOSIS — C50912 Malignant neoplasm of unspecified site of left female breast: Secondary | ICD-10-CM

## 2019-11-16 DIAGNOSIS — R197 Diarrhea, unspecified: Secondary | ICD-10-CM

## 2019-11-16 NOTE — Telephone Encounter (Signed)
Avoid dairy products and fruit juices. Stick to bland solids. Can OTC imodium. Once or twice a day.  Go to ER if any severe cramping, fever over 101 or blood in stool. Otherwise call if not improved by Monday

## 2019-11-16 NOTE — Telephone Encounter (Signed)
Patient advised. She verbalized understanding. She states she is started to feel better.   Patient is requesting a CBC be added to labs that were ordered on 08/06/2019.

## 2019-11-16 NOTE — Telephone Encounter (Signed)
Patient called reporting that a month ago, she was having nausea, headache and fever. She was tested for COVID and it was negative. Here symptoms subsided, but now she is again having nausea not controlled with medicine and also diarrhea, but NO fever this time. She was asking if this could her cancer causing her symptoms. I advised that she contact her PCP regarding her symptoms as this sounds more of a GI issue and not breast cancer and if they feel that we need to see her, then we can arrange an appointment at that time. She agreed to contact her PCP

## 2019-11-16 NOTE — Telephone Encounter (Signed)
Agreed.  Thank you

## 2019-11-16 NOTE — Telephone Encounter (Signed)
Pt reports nausea, no vomiting, diarrhea x 2 days. Onset Wednesday night. States 1 episode wdn and Thursday, 2 this Am. States watery. Denies any abdominal pain, no dizziness. States afebrile. Reports tested negative for covid "Over thanksgiving." Reports had similar symptoms then, "But had a low fever." . States is staying hydrated. Also states "I do feel a little better today." Reports medication she is on for breat cancer can make her nauseated and takes Meclizine which is effective, states has helped today. Pt states she called [facility] and was advised to call PCP. Pt is calling to see "If there is a bug going around this could be from."  Home care advise given per protocol. Advised NT would route to practice for PCPs review. CB# [id number] [id number]  Reason for Disposition . MILD-MODERATE diarrhea (e.g., 1-6 times / day more than normal)  Answer Assessment - Initial Assessment Questions 1. DIARRHEA SEVERITY: "How bad is the diarrhea?" "How many extra stools have you had in the past 24 hours than normal?"    - NO DIARRHEA (SCALE 0)   - MILD (SCALE 1-3): Few loose or mushy BMs; increase of 1-3 stools over normal daily number of stools; mild increase in ostomy output.   -  MODERATE (SCALE 4-7): Increase of 4-6 stools daily over normal; moderate increase in ostomy output. * SEVERE (SCALE 8-10; OR 'WORST POSSIBLE'): Increase of 7 or more stools daily over normal; moderate increase in ostomy output; incontinence.    1 time yesterday,1 x Thursday   2 xs today 2. ONSET: "When did the diarrhea begin?"     Wednesday 3. BM CONSISTENCY: "How loose or watery is the diarrhea?"     watery 4. VOMITING: "Are you also vomiting?" If so, ask: "How many times in the past 24 hours?"     no 5. ABDOMINAL PAIN: "Are you having any abdominal pain?" If yes: "What does it feel like?" (e.g., crampy, dull, intermittent, constant)     no 6. ABDOMINAL PAIN SEVERITY: If present, ask: "How bad is the pain?"  (e.g., Scale  1-10; mild, moderate, or severe)   - MILD (1-3): doesn't interfere with normal activities, abdomen soft and not tender to touch    - MODERATE (4-7): interferes with normal activities or awakens from sleep, tender to touch    - SEVERE (8-10): excruciating pain, doubled over, unable to do any normal activities       NA 7. ORAL INTAKE: If vomiting, "Have you been able to drink liquids?" "How much fluids have you had in the past 24 hours?"    NA 8. HYDRATION: "Any signs of dehydration?" (e.g., dry mouth [not just dry lips], too weak to stand, dizziness, new weight loss) "When did you last urinate?"     no 9. EXPOSURE: "Have you traveled to a foreign country recently?" "Have you been exposed to anyone with diarrhea?" "Could you have eaten any food that was spoiled?"     Ate at fast food restaurant Wednesday 10. ANTIBIOTIC USE: "Are you taking antibiotics now or have you taken antibiotics in the past 2 months?"      no 11. OTHER SYMPTOMS: "Do you have any other symptoms?" (e.g., fever, blood in stool)      no  Protocols used: DIARRHEA-A-AH

## 2019-11-17 NOTE — Telephone Encounter (Signed)
Ok to print order for cbc in addition to other labs ordered 08-06-2019

## 2019-11-19 ENCOUNTER — Telehealth: Payer: Self-pay | Admitting: Family Medicine

## 2019-11-19 ENCOUNTER — Telehealth: Payer: Self-pay | Admitting: Gastroenterology

## 2019-11-19 DIAGNOSIS — R197 Diarrhea, unspecified: Secondary | ICD-10-CM

## 2019-11-19 DIAGNOSIS — R1013 Epigastric pain: Secondary | ICD-10-CM

## 2019-11-19 NOTE — Telephone Encounter (Signed)
OK, please print order for her to back up at labs.

## 2019-11-19 NOTE — Telephone Encounter (Signed)
Please see telephone encounter. KW

## 2019-11-19 NOTE — Telephone Encounter (Signed)
She is actually Dr. Verlin Grills pt and hasn't been seen since 01/03/2018 and reflux or nausea was not discussed. Please contact her and schedule an office visit. I can't give her advise on this. Dr Allen Norris only done a screening colonoscopy on her in 2018.

## 2019-11-19 NOTE — Telephone Encounter (Signed)
Copied from [place] [id number]. Topic: General - Inquiry >> Nov 19, 2019  9:00 AM Scherrie Gerlach wrote: Reason for CRM: pt states she called 12/11 to ask if she can add a cbc to her labs? Pt states she has not had one in yrs.  Note states dr Caryn Section said ok, but not in the active request. Pt also wanted per triage note, a lab to see if she has a bacterial inf.  Pt ate at [employer] 12/10 and since then she has ongoing nausea and stomach cramps.  Not diarrhea as triage noted.  Pt would like to get those labs today or tomorrow please

## 2019-11-19 NOTE — Telephone Encounter (Deleted)
Copied from [place] [id number]. Topic: General - Inquiry >> Nov 19, 2019  9:00 AM Scherrie Gerlach wrote: Reason for CRM: pt states she called 12/11 to ask if she can add a cbc to her labs? Pt states she has not had one in yrs.  Note states dr Caryn Section said ok, but not in the active request. Pt also wanted per triage note, a lab to see if she has a bacterial inf.  Pt ate at [employer] 12/10 and since then she has ongoing nausea and stomach cramps.  Not diarrhea as triage noted.  Pt would like to get those labs today or tomorrow please

## 2019-11-19 NOTE — Telephone Encounter (Signed)
Patient called & l/m on v/m. I returned her call. She would like to talk with Dr Dorothey Baseman nurse & discuss if he reflux has returned. She feels she has a bacterial infection since Wednesday after eating at [employer] she has had nausea. She has spoken to her Primary doctor and he is going to do blood work .Please call & advise.

## 2019-11-19 NOTE — Telephone Encounter (Signed)
Lab slip has been printed and left at [address] for patient to have labs drawn in the morning. KW

## 2019-11-19 NOTE — Telephone Encounter (Signed)
Spoke with patient on the phone she states that she is not doing better and has been having nausea, she is taking meclizine to help with nausea. Patient reports her last episode of diarrhea was on Friday, she still reports abdominal cramping and bloating. Patient states that abdominal pain is in her upper abdominal area. Patient is requesting that you put in lab order for her to be checked for bacterial infection in her G.I tract. KW

## 2019-11-20 ENCOUNTER — Other Ambulatory Visit: Payer: Self-pay | Admitting: Family Medicine

## 2019-11-20 NOTE — Addendum Note (Signed)
Addended by: Randal Buba on: 11/20/2019 11:16 AM   Modules accepted: Orders

## 2019-11-20 NOTE — Addendum Note (Signed)
Addended by: Randal Buba on: 11/20/2019 11:56 AM   Modules accepted: Orders

## 2019-11-20 NOTE — Telephone Encounter (Signed)
Patient called back requesting that we add an H. Pylori test due to her past history of h. Pylori infection.

## 2019-11-21 LAB — LIPID PANEL
Chol/HDL Ratio: 6.2 ratio — ABNORMAL HIGH (ref 0.0–4.4)
Cholesterol, Total: 299 mg/dL — ABNORMAL HIGH (ref 100–199)
HDL: 48 mg/dL (ref >39)
LDL Chol Calc (NIH): 214 mg/dL — ABNORMAL HIGH (ref 0–99)
Triglycerides: 191 mg/dL — ABNORMAL HIGH (ref 0–149)
VLDL Cholesterol Cal: 37 mg/dL (ref 5–40)

## 2019-11-21 LAB — COMPREHENSIVE METABOLIC PANEL
ALT: 35 IU/L — ABNORMAL HIGH (ref 0–32)
AST: 28 IU/L (ref 0–40)
Albumin/Globulin Ratio: 1.6 (ref 1.2–2.2)
Albumin: 4.6 g/dL (ref 3.8–4.8)
Alkaline Phosphatase: 103 IU/L (ref 39–117)
BUN/Creatinine Ratio: 15 (ref 12–28)
BUN: 12 mg/dL (ref 8–27)
Bilirubin Total: 0.4 mg/dL (ref 0.0–1.2)
CO2: 25 mmol/L (ref 20–29)
Calcium: 10.2 mg/dL (ref 8.7–10.3)
Chloride: 98 mmol/L (ref 96–106)
Creatinine, Ser: 0.8 mg/dL (ref 0.57–1.00)
GFR calc Af Amer: 88 mL/min/{1.73_m2} (ref >59)
GFR calc non Af Amer: 77 mL/min/{1.73_m2} (ref >59)
Globulin, Total: 2.8 g/dL (ref 1.5–4.5)
Glucose: 104 mg/dL — ABNORMAL HIGH (ref 65–99)
Potassium: 4 mmol/L (ref 3.5–5.2)
Sodium: 139 mmol/L (ref 134–144)
Total Protein: 7.4 g/dL (ref 6.0–8.5)

## 2019-11-21 LAB — CBC
Hematocrit: 36.7 % (ref 34.0–46.6)
Hemoglobin: 12.4 g/dL (ref 11.1–15.9)
MCH: 29.2 pg (ref 26.6–33.0)
MCHC: 33.8 g/dL (ref 31.5–35.7)
MCV: 86 fL (ref 79–97)
Platelets: 201 10*3/uL (ref 150–450)
RBC: 4.25 x10E6/uL (ref 3.77–5.28)
RDW: 13.5 % (ref 11.7–15.4)
WBC: 8.4 10*3/uL (ref 3.4–10.8)

## 2019-11-21 LAB — TSH: TSH: 1.84 u[IU]/mL (ref 0.450–4.500)

## 2019-11-21 LAB — RENAL FUNCTION PANEL: Phosphorus: 3.3 mg/dL (ref 3.0–4.3)

## 2019-11-22 ENCOUNTER — Telehealth: Payer: Self-pay

## 2019-11-22 DIAGNOSIS — E7800 Pure hypercholesterolemia, unspecified: Secondary | ICD-10-CM

## 2019-11-22 LAB — CALPROTECTIN, FECAL: Calprotectin, Fecal: 19 ug/g (ref 0–120)

## 2019-11-22 MED ORDER — ROSUVASTATIN CALCIUM 5 MG PO TABS: 5.0000 mg | ORAL_TABLET | Freq: Every day | ORAL | 3 refills | Status: DC

## 2019-11-22 NOTE — Telephone Encounter (Signed)
-----   Message from Birdie Sons, MD sent at 11/21/2019  2:35 PM EST ----- Cholesterol is very high at 299. Associated with high risk for strokes and heart attacks. Need to try a different cholesterol medication. Recommend rosuvastatin 5mg  once a day #30 rf x 3 and schedule follow up in 2-3 months.

## 2019-11-22 NOTE — Telephone Encounter (Signed)
Patient advised and agrees to start medication. Prescription sent into pharmacy.

## 2019-11-22 NOTE — Telephone Encounter (Signed)
Patient advised as below. Patient states she is cannot take any statins. She states she has tried taking statins in the past and it caused severe joint pain.

## 2019-11-22 NOTE — Telephone Encounter (Signed)
There are several different classes of statins and rosuvastatin is in a different class than what she has taken before. It is much less likely to cause joint pains than other statins. It probably won't cause the same problems, but if it does we will stop it.

## 2019-11-23 ENCOUNTER — Encounter: Payer: Self-pay | Admitting: Family Medicine

## 2019-11-23 ENCOUNTER — Ambulatory Visit (INDEPENDENT_AMBULATORY_CARE_PROVIDER_SITE_OTHER): Payer: Medicare Other | Admitting: Family Medicine

## 2019-11-23 ENCOUNTER — Other Ambulatory Visit: Payer: Self-pay

## 2019-11-23 VITALS — BP 144/87 | HR 76 | Temp 96.8°F | Wt 285.0 lb

## 2019-11-23 DIAGNOSIS — K219 Gastro-esophageal reflux disease without esophagitis: Secondary | ICD-10-CM

## 2019-11-23 LAB — H. PYLORI ANTIGEN, STOOL: H pylori Ag, Stl: NEGATIVE

## 2019-11-23 MED ORDER — LANSOPRAZOLE 30 MG PO CPDR: 30.0000 mg | DELAYED_RELEASE_CAPSULE | Freq: Every day | ORAL | 3 refills | Status: DC

## 2019-11-23 NOTE — Patient Instructions (Signed)

## 2019-11-23 NOTE — Progress Notes (Signed)
Patient: Kaylee Reyes Female    DOB: October 09, 1951   68 y.o.   MRN: [id number] Visit Date: 11/23/2019  Today's Provider: Lavon Paganini, MD   Chief Complaint  Patient presents with  . Gastroesophageal Reflux    Started 11/14/2019   Subjective:     Gastroesophageal Reflux She complains of abdominal pain, coughing, heartburn, a hoarse voice and nausea (After eating.). She reports no choking or no wheezing. This is a new problem. The current episode started 1 to 4 weeks ago. The problem has been gradually worsening. The heartburn does not wake her from sleep. She has tried an antacid, a PPI and a diet change for the symptoms. The treatment provided mild relief.    Started on 11/14/2019 after eating at [facility].  Started initially with nausea. Tried Meclizine without relief.  Worse after eating.  Has had a similar problem with history of H pylori years ago.     Allergies  Allergen Reactions  . Penicillins Shortness Of Breath    Has patient had a PCN reaction causing immediate rash, facial/tongue/throat swelling, SOB or lightheadedness with hypotension: Yes Has patient had a PCN reaction causing severe rash involving mucus membranes or skin necrosis: No Has patient had a PCN reaction that required hospitalization No Has patient had a PCN reaction occurring within the last 10 years: No If all of the above answers are "NO", then may proceed with Cephalosporin use.   . Nickel Hives  . Pravastatin Sodium Other (See Comments)    Myalgia      Current Outpatient Medications:  .  albuterol (VENTOLIN HFA) 108 (90 Base) MCG/ACT inhaler, INHALE 2 PUFFS BY MOUTH EVERY 6 HOURS AS NEEDED WHEEZING OR SHORTNESS OF BREATH, Disp: 18 g, Rfl: 3 .  atenolol (TENORMIN) 50 MG tablet, TAKE 1 TABLET DAILY, Disp: 90 tablet, Rfl: 3 .  B Complex-C (SUPER B COMPLEX PO), Take 1 tablet by mouth daily., Disp: , Rfl:  .  CALCIUM CITRATE PO, Take 1 tablet by mouth daily., Disp: , Rfl:  .  docusate  sodium (COLACE) 100 MG capsule, Take 200 mg by mouth daily. , Disp: , Rfl:  .  ELDERBERRY PO, Take by mouth., Disp: , Rfl:  .  hydrochlorothiazide (HYDRODIURIL) 25 MG tablet, Take 1 tablet (25 mg total) by mouth daily., Disp: 90 tablet, Rfl: 4 .  ibuprofen (ADVIL,MOTRIN) 200 MG tablet, Take 400 mg by mouth every 6 (six) hours as needed for mild pain., Disp: , Rfl:  .  ketoconazole (NIZORAL) 2 % cream, Apply 1 application topically daily., Disp: 50 g, Rfl: 3 .  letrozole (FEMARA) 2.5 MG tablet, TAKE 1 TABLET DAILY (ACCORD BRAND ONLY), Disp: 90 tablet, Rfl: 3 .  levothyroxine (SYNTHROID) 75 MCG tablet, TAKE 1 TABLET DAILY, Disp: 90 tablet, Rfl: 3 .  Loratadine (CLARITIN) 10 MG CAPS, Take by mouth., Disp: , Rfl:  .  Magnesium 400 MG TABS, Take 600 mg by mouth daily. , Disp: , Rfl:  .  meclizine (ANTIVERT) 25 MG tablet, TAKE 1 TABLET BY MOUTH EVERY 4 TO 6 HOURS AS NEEDED FOR NAUSEA, Disp: 30 tablet, Rfl: 5 .  Misc Natural Products (TURMERIC CURCUMIN) CAPS, Take 1 capsule by mouth daily., Disp: , Rfl:  .  nystatin (NYSTATIN) powder, Apply topically 4 (four) times daily., Disp: 15 g, Rfl: 3 .  Olopatadine HCl (PATADAY OP), Apply to eye., Disp: , Rfl:  .  triamcinolone cream (KENALOG) 0.5 %, Apply 1 application topically 2 (two) times  daily as needed. For itching, Disp: 30 g, Rfl: 0 .  Vitamin D, Ergocalciferol, (DRISDOL) 1.25 MG (50000 UT) CAPS capsule, TAKE 1 CAPSULE BY MOUTH 1 TIME A WEEK, Disp: 12 capsule, Rfl: 0 .  rosuvastatin (CRESTOR) 5 MG tablet, Take 1 tablet (5 mg total) by mouth daily. (Patient not taking: Reported on 11/23/2019), Disp: 30 tablet, Rfl: 3 No current facility-administered medications for this visit.  Facility-Administered Medications Ordered in Other Visits:  .  sodium chloride flush (NS) 0.9 % injection 10 mL, 10 mL, Intravenous, PRN, Lloyd Huger, MD, 10 mL at 02/09/16 0930 .  sodium chloride flush (NS) 0.9 % injection 10 mL, 10 mL, Intravenous, PRN, Lloyd Huger, MD, 10 mL at 06/21/16 0950  Review of Systems  Constitutional: Negative.   HENT: Positive for hoarse voice, trouble swallowing and voice change.   Respiratory: Positive for cough. Negative for apnea, choking, chest tightness, shortness of breath, wheezing and stridor.   Gastrointestinal: Positive for abdominal pain, heartburn and nausea (After eating.). Negative for abdominal distention, anal bleeding, blood in stool, constipation, diarrhea, rectal pain and vomiting.  Neurological: Negative for dizziness, light-headedness and headaches.    Social History   Tobacco Use  . Smoking status: Never Smoker  . Smokeless tobacco: Never Used  Substance Use Topics  . Alcohol use: No    Alcohol/week: 0.0 standard drinks      Objective:   BP (!) 144/87 (BP Location: Left Wrist, Patient Position: Sitting, Cuff Size: Large)   Pulse 76   Temp (!) 96.8 F (36 C) (Temporal)   Wt 285 lb (129.3 kg)   BMI 46.00 kg/m  Vitals:   11/23/19 1503  BP: (!) 144/87  Pulse: 76  Temp: (!) 96.8 F (36 C)  TempSrc: Temporal  Weight: 285 lb (129.3 kg)  Body mass index is 46 kg/m.   Physical Exam Vitals reviewed.  Constitutional:      General: She is not in acute distress.    Appearance: Normal appearance. She is not diaphoretic.  HENT:     Head: Normocephalic and atraumatic.  Eyes:     General: No scleral icterus.    Conjunctiva/sclera: Conjunctivae normal.  Cardiovascular:     Rate and Rhythm: Normal rate and regular rhythm.     Pulses: Normal pulses.     Heart sounds: Normal heart sounds. No murmur.  Pulmonary:     Effort: Pulmonary effort is normal. No respiratory distress.     Breath sounds: Normal breath sounds. No wheezing.  Abdominal:     General: Bowel sounds are normal. There is no distension.     Palpations: Abdomen is soft.     Tenderness: There is no abdominal tenderness. There is no guarding or rebound.  Musculoskeletal:     Cervical back: Neck supple. No tenderness.      Right lower leg: No edema.     Left lower leg: No edema.  Skin:    General: Skin is warm and dry.     Findings: No rash.  Neurological:     Mental Status: She is alert and oriented to person, place, and time. Mental status is at baseline.  Psychiatric:        Mood and Affect: Mood normal.        Behavior: Behavior normal.      No results found for any visits on 11/23/19.     Assessment & Plan    1. Gastroesophageal reflux disease, unspecified whether esophagitis present -Recurrent problem, but  new episode x1.5 weeks -Started after eating [employer] and is worse after eating now -She has tried OTC PPI with only minimal relief -Reassurance given that H. pylori was negative and other lab work recently done was normal and unrevealing -Symptoms are consistent with GERD -Trial of higher dose PPI x2 to 4 weeks -She has upcoming appointment with GI -Discussed return precautions   Meds ordered this encounter  Medications  . lansoprazole (PREVACID) 30 MG capsule    Sig: Take 1 capsule (30 mg total) by mouth daily at 12 noon.    Dispense:  30 capsule    Refill:  3    Approximately 25 minutes was spent in discussion of which greater than 50% was consultation.    Return if symptoms worsen or fail to improve.   The entirety of the information documented in the History of Present Illness, Review of Systems and Physical Exam were personally obtained by me. Portions of this information were initially documented by Ashley Royalty, CMA and reviewed by me for thoroughness and accuracy.    Coren Sagan, Dionne Bucy, MD MPH [facility]

## 2019-11-24 LAB — OVA AND PARASITE EXAMINATION

## 2019-11-26 LAB — FECAL LACTOFERRIN, QUANT: Lactoferrin, Fecal, Quant.: <1 ug/mL(g) (ref 0.00–7.24)

## 2019-11-26 LAB — CLOSTRIDIUM DIFFICILE BY PCR: Toxigenic C. Difficile by PCR: NEGATIVE

## 2019-11-26 LAB — C DIFFICILE, CYTOTOXIN B

## 2019-11-26 LAB — C DIFFICILE TOXINS A+B W/RFLX: C difficile Toxins A+B, EIA: NEGATIVE

## 2019-12-04 ENCOUNTER — Telehealth: Payer: Self-pay

## 2019-12-04 DIAGNOSIS — K219 Gastro-esophageal reflux disease without esophagitis: Secondary | ICD-10-CM

## 2019-12-04 NOTE — Telephone Encounter (Signed)
Patient was seen by Dr. Brita Romp on 11/23/2019 with complaints of GERD. KW

## 2019-12-04 NOTE — Telephone Encounter (Signed)
Copied from [place] [id number]. Topic: General - Inquiry >> Dec 04, 2019  9:03 AM Percell Belt A wrote: Reason for CRM: pt called and stated that she is still having trouble with acid reflux and pain in upper stomach and burning.  She has called the GI dr and made an appt but she can not get in til feb.  She has made an appt with DR Wall.  Best number [phone]

## 2019-12-05 NOTE — Telephone Encounter (Signed)
Pt calling to check status. Pt would prefer to have test done with Dr Maryruth Hancock. Please advise

## 2019-12-05 NOTE — Addendum Note (Signed)
Addended by: Shawna Orleans on: 12/05/2019 02:15 PM   Modules accepted: Orders

## 2019-12-05 NOTE — Telephone Encounter (Signed)
From [facility] 

## 2019-12-19 ENCOUNTER — Encounter: Payer: Medicare Other | Admitting: Obstetrics and Gynecology

## 2019-12-24 ENCOUNTER — Other Ambulatory Visit: Payer: Self-pay | Admitting: Family Medicine

## 2020-01-08 ENCOUNTER — Other Ambulatory Visit: Payer: Self-pay

## 2020-01-09 ENCOUNTER — Other Ambulatory Visit: Payer: Self-pay

## 2020-01-09 ENCOUNTER — Ambulatory Visit (INDEPENDENT_AMBULATORY_CARE_PROVIDER_SITE_OTHER): Payer: Medicare Other | Admitting: Gastroenterology

## 2020-01-09 ENCOUNTER — Encounter: Payer: Self-pay | Admitting: Gastroenterology

## 2020-01-09 VITALS — BP 170/97 | HR 71 | Temp 97.4°F | Ht 66.0 in | Wt 286.2 lb

## 2020-01-09 DIAGNOSIS — K2100 Gastro-esophageal reflux disease with esophagitis, without bleeding: Secondary | ICD-10-CM

## 2020-01-09 DIAGNOSIS — K219 Gastro-esophageal reflux disease without esophagitis: Secondary | ICD-10-CM | POA: Diagnosis not present

## 2020-01-09 DIAGNOSIS — R112 Nausea with vomiting, unspecified: Secondary | ICD-10-CM | POA: Diagnosis not present

## 2020-01-09 MED ORDER — PANTOPRAZOLE SODIUM 40 MG PO TBEC: 40.0000 mg | DELAYED_RELEASE_TABLET | Freq: Every day | ORAL | 6 refills | Status: DC

## 2020-01-09 NOTE — H&P (View-Only) (Signed)
Primary Care Physician: Birdie Sons, MD  Primary Gastroenterologist:  Dr. [name]  Chief Complaint  Patient presents with  . Gastroesophageal Reflux    HPI: Kaylee Reyes is a 69 y.o. female here is a report of her having her reflux return.  The patient had seen me in 2018 for a colonoscopy and has subsequently follow-up with Dr. Marius Ditch.  The patient had called in saying that she was having reflux again and reported that she thought she had an infection after eating at [employer].  She reports that she was having some nausea at that time.  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 The patient now comes in with chronic nausea.  She states that she stopped her PPI 1 year ago and just recently started her Prevacid.  She states that she was taking over-the-counter Prevacid but has now been taking prescription Prevacid.  She states that her nausea is mostly in the evening and in the morning but also can last throughout the entire day.  She does take antinausea medication which he says does help.  Despite all this nausea the patient denies any unexplained weight loss.  There is no report of any black stools or bloody stools.  The patient has also not had an upper endoscopy in some time.  Current Outpatient Medications  Medication Sig Dispense Refill  . albuterol (VENTOLIN HFA) 108 (90 Base) MCG/ACT inhaler INHALE 2 PUFFS BY MOUTH EVERY 6 HOURS AS NEEDED WHEEZING OR SHORTNESS OF BREATH 18 g 3  . atenolol (TENORMIN) 50 MG tablet TAKE 1 TABLET DAILY 90 tablet 3  . B Complex-C (SUPER B COMPLEX PO) Take 1 tablet by mouth daily.    Marland Kitchen CALCIUM CITRATE PO Take 1 tablet by mouth daily.    . Cholecalciferol 50 MCG (2000 UT) TABS Take by mouth.    . docusate sodium (COLACE) 100 MG capsule Take 200 mg by mouth daily.     Marland Kitchen ELDERBERRY PO Take by mouth.    . gabapentin (NEURONTIN) 300 MG capsule     . hydrochlorothiazide (HYDRODIURIL) 25 MG tablet TAKE 1  TABLET DAILY 90 tablet 3  . ibuprofen (ADVIL,MOTRIN) 200 MG tablet Take 400 mg by mouth every 6 (six) hours as needed for mild pain.    Marland Kitchen ketoconazole (NIZORAL) 2 % cream Apply 1 application topically daily. 50 g 3  . lansoprazole (PREVACID) 30 MG capsule Take 1 capsule (30 mg total) by mouth daily at 12 noon. 30 capsule 3  . letrozole (FEMARA) 2.5 MG tablet TAKE 1 TABLET DAILY (ACCORD BRAND ONLY) 90 tablet 3  . levothyroxine (SYNTHROID) 75 MCG tablet TAKE 1 TABLET DAILY 90 tablet 3  . Loratadine (CLARITIN) 10 MG CAPS Take by mouth.    . Magnesium 400 MG TABS Take 600 mg by mouth daily.     . meclizine (ANTIVERT) 25 MG tablet TAKE 1 TABLET BY MOUTH EVERY 4 TO 6 HOURS AS NEEDED FOR NAUSEA 30 tablet 5  . Misc Natural Products (TURMERIC CURCUMIN) CAPS Take 1 capsule by mouth daily.    Marland Kitchen nystatin (NYSTATIN) powder Apply topically 4 (four) times daily. 15 g 3  . Olopatadine HCl (PATADAY OP) Apply to eye.    . rosuvastatin (CRESTOR) 5 MG tablet Take 1 tablet (5 mg total) by mouth daily. 30 tablet 3  . triamcinolone cream (KENALOG) 0.5 % Apply 1 application topically 2 (two) times daily as needed. For itching 30 g 0  .  Vitamin D, Ergocalciferol, (DRISDOL) 1.25 MG (50000 UT) CAPS capsule TAKE 1 CAPSULE BY MOUTH 1 TIME A WEEK 12 capsule 0   No current facility-administered medications for this visit.   Facility-Administered Medications Ordered in Other Visits  Medication Dose Route Frequency Provider Last Rate Last Admin  . sodium chloride flush (NS) 0.9 % injection 10 mL  10 mL Intravenous PRN Lloyd Huger, MD   10 mL at 02/09/16 0930  . sodium chloride flush (NS) 0.9 % injection 10 mL  10 mL Intravenous PRN Lloyd Huger, MD   10 mL at 06/21/16 0950    Allergies as of 01/09/2020 - Review Complete 01/09/2020  Allergen Reaction Noted  . Penicillins Shortness Of Breath 08/20/2015  . Nickel Hives 10/15/2015  . Pravastatin sodium Other (See Comments) 08/20/2015    ROS:  General:  Negative for anorexia, weight loss, fever, chills, fatigue, weakness. ENT: Negative for hoarseness, difficulty swallowing , nasal congestion. CV: Negative for chest pain, angina, palpitations, dyspnea on exertion, peripheral edema.  Respiratory: Negative for dyspnea at rest, dyspnea on exertion, cough, sputum, wheezing.  GI: See history of present illness. GU:  Negative for dysuria, hematuria, urinary incontinence, urinary frequency, nocturnal urination.  Endo: Negative for unusual weight change.    Physical Examination:   BP (!) 170/97   Pulse 71   Temp (!) 97.4 F (36.3 C) (Oral)   Ht 5\' 6"  (1.676 m)   Wt 286 lb 3.2 oz (129.8 kg)   BMI 46.19 kg/m   General: Well-nourished, well-developed in no acute distress.  Eyes: No icterus. Conjunctivae pink. Lungs: Clear to auscultation bilaterally. Non-labored. Heart: Regular rate and rhythm, no murmurs rubs or gallops.  Abdomen: Bowel sounds are normal, nontender, nondistended, no hepatosplenomegaly or masses, no abdominal bruits or hernia , no rebound or guarding.   Extremities: No lower extremity edema. No clubbing or deformities. Neuro: Alert and oriented x 3.  Grossly intact. Skin: Warm and dry, no jaundice.   Psych: Alert and cooperative, normal mood and affect.  Labs:    Imaging Studies: No results found.  Assessment and Plan:   Kaylee Reyes is a 69 y.o. y/o female who comes in today with chronic nausea.  The patient reports the nausea to be most prominent in the morning but also sometimes in the evening and also throughout the day.  I have reassured the patient that it is unlikely a bacterial infection from 2 months ago when she ate out for fast food.  She has also been told that I am going to switch her medication from Prevacid to pantoprazole and that she should take it in the evening so that there is maximum acid suppression while she is laying down at night and may be having reflux symptoms.  The patient does not have a  gallbladder therefore gallbladder disease as the cause of her nausea is not the issue.  The patient will also be set up for a upper endoscopy to rule out a hiatal hernia or any signs of reflux or Barrett's. I have discussed risks & benefits which include, but are not limited to, bleeding, infection, perforation & drug reaction.  The patient agrees with this plan & written consent will be obtained.        [name], MD. [facility] Regal    Note: This dictation was prepared with Dragon dictation along with smaller phrase technology. Any transcriptional errors that result from this process are unintentional.

## 2020-01-09 NOTE — Progress Notes (Signed)
Primary Care Physician: Birdie Sons, MD  Primary Gastroenterologist:  Dr. [name]  Chief Complaint  Patient presents with  . Gastroesophageal Reflux    HPI: Kaylee Reyes is a 69 y.o. female here is a report of her having her reflux return.  The patient had seen me in 2018 for a colonoscopy and has subsequently follow-up with Dr. Marius Ditch.  The patient had called in saying that she was having reflux again and reported that she thought she had an infection after eating at [employer].  She reports that she was having some nausea at that time.  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 The patient now comes in with chronic nausea.  She states that she stopped her PPI 1 year ago and just recently started her Prevacid.  She states that she was taking over-the-counter Prevacid but has now been taking prescription Prevacid.  She states that her nausea is mostly in the evening and in the morning but also can last throughout the entire day.  She does take antinausea medication which he says does help.  Despite all this nausea the patient denies any unexplained weight loss.  There is no report of any black stools or bloody stools.  The patient has also not had an upper endoscopy in some time.  Current Outpatient Medications  Medication Sig Dispense Refill  . albuterol (VENTOLIN HFA) 108 (90 Base) MCG/ACT inhaler INHALE 2 PUFFS BY MOUTH EVERY 6 HOURS AS NEEDED WHEEZING OR SHORTNESS OF BREATH 18 g 3  . atenolol (TENORMIN) 50 MG tablet TAKE 1 TABLET DAILY 90 tablet 3  . B Complex-C (SUPER B COMPLEX PO) Take 1 tablet by mouth daily.    Marland Kitchen CALCIUM CITRATE PO Take 1 tablet by mouth daily.    . Cholecalciferol 50 MCG (2000 UT) TABS Take by mouth.    . docusate sodium (COLACE) 100 MG capsule Take 200 mg by mouth daily.     Marland Kitchen ELDERBERRY PO Take by mouth.    . gabapentin (NEURONTIN) 300 MG capsule     . hydrochlorothiazide (HYDRODIURIL) 25 MG tablet TAKE 1  TABLET DAILY 90 tablet 3  . ibuprofen (ADVIL,MOTRIN) 200 MG tablet Take 400 mg by mouth every 6 (six) hours as needed for mild pain.    Marland Kitchen ketoconazole (NIZORAL) 2 % cream Apply 1 application topically daily. 50 g 3  . lansoprazole (PREVACID) 30 MG capsule Take 1 capsule (30 mg total) by mouth daily at 12 noon. 30 capsule 3  . letrozole (FEMARA) 2.5 MG tablet TAKE 1 TABLET DAILY (ACCORD BRAND ONLY) 90 tablet 3  . levothyroxine (SYNTHROID) 75 MCG tablet TAKE 1 TABLET DAILY 90 tablet 3  . Loratadine (CLARITIN) 10 MG CAPS Take by mouth.    . Magnesium 400 MG TABS Take 600 mg by mouth daily.     . meclizine (ANTIVERT) 25 MG tablet TAKE 1 TABLET BY MOUTH EVERY 4 TO 6 HOURS AS NEEDED FOR NAUSEA 30 tablet 5  . Misc Natural Products (TURMERIC CURCUMIN) CAPS Take 1 capsule by mouth daily.    Marland Kitchen nystatin (NYSTATIN) powder Apply topically 4 (four) times daily. 15 g 3  . Olopatadine HCl (PATADAY OP) Apply to eye.    . rosuvastatin (CRESTOR) 5 MG tablet Take 1 tablet (5 mg total) by mouth daily. 30 tablet 3  . triamcinolone cream (KENALOG) 0.5 % Apply 1 application topically 2 (two) times daily as needed. For itching 30 g 0  .  Vitamin D, Ergocalciferol, (DRISDOL) 1.25 MG (50000 UT) CAPS capsule TAKE 1 CAPSULE BY MOUTH 1 TIME A WEEK 12 capsule 0   No current facility-administered medications for this visit.   Facility-Administered Medications Ordered in Other Visits  Medication Dose Route Frequency Provider Last Rate Last Admin  . sodium chloride flush (NS) 0.9 % injection 10 mL  10 mL Intravenous PRN Lloyd Huger, MD   10 mL at 02/09/16 0930  . sodium chloride flush (NS) 0.9 % injection 10 mL  10 mL Intravenous PRN Lloyd Huger, MD   10 mL at 06/21/16 0950    Allergies as of 01/09/2020 - Review Complete 01/09/2020  Allergen Reaction Noted  . Penicillins Shortness Of Breath 08/20/2015  . Nickel Hives 10/15/2015  . Pravastatin sodium Other (See Comments) 08/20/2015    ROS:  General:  Negative for anorexia, weight loss, fever, chills, fatigue, weakness. ENT: Negative for hoarseness, difficulty swallowing , nasal congestion. CV: Negative for chest pain, angina, palpitations, dyspnea on exertion, peripheral edema.  Respiratory: Negative for dyspnea at rest, dyspnea on exertion, cough, sputum, wheezing.  GI: See history of present illness. GU:  Negative for dysuria, hematuria, urinary incontinence, urinary frequency, nocturnal urination.  Endo: Negative for unusual weight change.    Physical Examination:   BP (!) 170/97   Pulse 71   Temp (!) 97.4 F (36.3 C) (Oral)   Ht 5\' 6"  (1.676 m)   Wt 286 lb 3.2 oz (129.8 kg)   BMI 46.19 kg/m   General: Well-nourished, well-developed in no acute distress.  Eyes: No icterus. Conjunctivae pink. Lungs: Clear to auscultation bilaterally. Non-labored. Heart: Regular rate and rhythm, no murmurs rubs or gallops.  Abdomen: Bowel sounds are normal, nontender, nondistended, no hepatosplenomegaly or masses, no abdominal bruits or hernia , no rebound or guarding.   Extremities: No lower extremity edema. No clubbing or deformities. Neuro: Alert and oriented x 3.  Grossly intact. Skin: Warm and dry, no jaundice.   Psych: Alert and cooperative, normal mood and affect.  Labs:    Imaging Studies: No results found.  Assessment and Plan:   Kaylee Reyes is a 69 y.o. y/o female who comes in today with chronic nausea.  The patient reports the nausea to be most prominent in the morning but also sometimes in the evening and also throughout the day.  I have reassured the patient that it is unlikely a bacterial infection from 2 months ago when she ate out for fast food.  She has also been told that I am going to switch her medication from Prevacid to pantoprazole and that she should take it in the evening so that there is maximum acid suppression while she is laying down at night and may be having reflux symptoms.  The patient does not have a  gallbladder therefore gallbladder disease as the cause of her nausea is not the issue.  The patient will also be set up for a upper endoscopy to rule out a hiatal hernia or any signs of reflux or Barrett's. I have discussed risks & benefits which include, but are not limited to, bleeding, infection, perforation & drug reaction.  The patient agrees with this plan & written consent will be obtained.        [name], MD. Marval Regal    Note: This dictation was prepared with Dragon dictation along with smaller phrase technology. Any transcriptional errors that result from this process are unintentional.

## 2020-01-15 ENCOUNTER — Other Ambulatory Visit: Payer: Self-pay

## 2020-01-15 ENCOUNTER — Other Ambulatory Visit
Admission: RE | Admit: 2020-01-15 | Discharge: 2020-01-15 | Disposition: A | Discharge status: Home / Self Care | Payer: Medicare Other | Source: Ambulatory Visit | Attending: Gastroenterology | Admitting: Gastroenterology

## 2020-01-15 DIAGNOSIS — Z20822 Contact with and (suspected) exposure to covid-19: Secondary | ICD-10-CM | POA: Diagnosis not present

## 2020-01-15 DIAGNOSIS — Z01812 Encounter for preprocedural laboratory examination: Secondary | ICD-10-CM | POA: Insufficient documentation

## 2020-01-15 LAB — SARS CORONAVIRUS 2 (TAT 6-24 HRS): SARS Coronavirus 2: NEGATIVE

## 2020-01-16 ENCOUNTER — Encounter: Payer: Self-pay | Admitting: Gastroenterology

## 2020-01-17 ENCOUNTER — Encounter
Admission: RE | Disposition: A | Discharge status: Home / Self Care | Payer: Self-pay | Source: Home / Self Care | Attending: Gastroenterology

## 2020-01-17 ENCOUNTER — Other Ambulatory Visit: Payer: Self-pay

## 2020-01-17 ENCOUNTER — Ambulatory Visit: Payer: Medicare Other | Admitting: Registered Nurse

## 2020-01-17 ENCOUNTER — Ambulatory Visit
Admission: RE | Admit: 2020-01-17 | Discharge: 2020-01-17 | Disposition: A | Discharge status: Home / Self Care | Payer: Medicare Other | Attending: Gastroenterology | Admitting: Gastroenterology

## 2020-01-17 ENCOUNTER — Encounter: Payer: Self-pay | Admitting: Gastroenterology

## 2020-01-17 DIAGNOSIS — D649 Anemia, unspecified: Secondary | ICD-10-CM | POA: Diagnosis not present

## 2020-01-17 DIAGNOSIS — E039 Hypothyroidism, unspecified: Secondary | ICD-10-CM | POA: Diagnosis not present

## 2020-01-17 DIAGNOSIS — K449 Diaphragmatic hernia without obstruction or gangrene: Secondary | ICD-10-CM | POA: Insufficient documentation

## 2020-01-17 DIAGNOSIS — Z79890 Hormone replacement therapy (postmenopausal): Secondary | ICD-10-CM | POA: Diagnosis not present

## 2020-01-17 DIAGNOSIS — G629 Polyneuropathy, unspecified: Secondary | ICD-10-CM | POA: Diagnosis not present

## 2020-01-17 DIAGNOSIS — Z8719 Personal history of other diseases of the digestive system: Secondary | ICD-10-CM | POA: Insufficient documentation

## 2020-01-17 DIAGNOSIS — K219 Gastro-esophageal reflux disease without esophagitis: Secondary | ICD-10-CM | POA: Diagnosis not present

## 2020-01-17 DIAGNOSIS — R1110 Vomiting, unspecified: Secondary | ICD-10-CM

## 2020-01-17 DIAGNOSIS — C50912 Malignant neoplasm of unspecified site of left female breast: Secondary | ICD-10-CM | POA: Diagnosis not present

## 2020-01-17 DIAGNOSIS — R112 Nausea with vomiting, unspecified: Secondary | ICD-10-CM | POA: Diagnosis not present

## 2020-01-17 DIAGNOSIS — Z79811 Long term (current) use of aromatase inhibitors: Secondary | ICD-10-CM | POA: Insufficient documentation

## 2020-01-17 DIAGNOSIS — Z6841 Body Mass Index (BMI) 40.0 and over, adult: Secondary | ICD-10-CM | POA: Diagnosis not present

## 2020-01-17 DIAGNOSIS — I10 Essential (primary) hypertension: Secondary | ICD-10-CM | POA: Diagnosis not present

## 2020-01-17 DIAGNOSIS — K2100 Gastro-esophageal reflux disease with esophagitis, without bleeding: Secondary | ICD-10-CM

## 2020-01-17 DIAGNOSIS — Z79899 Other long term (current) drug therapy: Secondary | ICD-10-CM | POA: Diagnosis not present

## 2020-01-17 DIAGNOSIS — R110 Nausea: Secondary | ICD-10-CM | POA: Diagnosis present

## 2020-01-17 DIAGNOSIS — E785 Hyperlipidemia, unspecified: Secondary | ICD-10-CM | POA: Insufficient documentation

## 2020-01-17 HISTORY — PX: ESOPHAGOGASTRODUODENOSCOPY (EGD) WITH PROPOFOL: SHX5813

## 2020-01-17 SURGERY — ESOPHAGOGASTRODUODENOSCOPY (EGD) WITH PROPOFOL
Anesthesia: General

## 2020-01-17 MED ORDER — SODIUM CHLORIDE 0.9 % IV SOLN: INTRAVENOUS | Status: DC

## 2020-01-17 MED ORDER — PROPOFOL 10 MG/ML IV BOLUS
INTRAVENOUS | Status: DC | PRN
  Administered 2020-01-17: 30 mg via INTRAVENOUS
  Administered 2020-01-17: 70 mg via INTRAVENOUS

## 2020-01-17 NOTE — Op Note (Signed)
[facility] Gastroenterology Patient Name: Kaylee Reyes Procedure Date: 01/17/2020 12:55 PM MRN: [id number] Account #: [id number] Date of Birth: 05-06-1951 Admit Type: Outpatient Age: 69 Room: [facility] ENDO ROOM 3 Gender: Female Note Status: Finalized Procedure:             Upper GI endoscopy Indications:           Nausea Providers:             [name] MD, MD Referring MD:          Kirstie Peri. Caryn Section, MD (Referring MD) Medicines:             Propofol per Anesthesia Complications:         No immediate complications. Procedure:             Pre-Anesthesia Assessment:                        - Prior to the procedure, a History and Physical was                         performed, and patient medications and allergies were                         reviewed. The patient's tolerance of previous                         anesthesia was also reviewed. The risks and benefits                         of the procedure and the sedation options and risks                         were discussed with the patient. All questions were                         answered, and informed consent was obtained. Prior                         Anticoagulants: The patient has taken no previous                         anticoagulant or antiplatelet agents. ASA Grade                         Assessment: II - A patient with mild systemic disease.                         After reviewing the risks and benefits, the patient                         was deemed in satisfactory condition to undergo the                         procedure.                        After obtaining informed consent, the endoscope was  passed under direct vision. Throughout the procedure,                         the patient's blood pressure, pulse, and oxygen                         saturations were monitored continuously. The Endoscope                         was introduced through the mouth, and advanced to the                        second part of duodenum. The upper GI endoscopy was                         accomplished without difficulty. The patient tolerated                         the procedure well. Findings:      A small hiatal hernia was present.      The stomach was normal.      The examined duodenum was normal. Impression:            - Small hiatal hernia.                        - Normal stomach.                        - Normal examined duodenum.                        - No specimens collected. Recommendation:        - Discharge patient to home.                        - Resume previous diet.                        - Continue present medications. Procedure Code(s):     --- Professional ---                        [phone], Esophagogastroduodenoscopy, flexible,                         transoral; diagnostic, including collection of                         specimen(s) by brushing or washing, when performed                         (separate procedure) Diagnosis Code(s):     --- Professional ---                        R11.0, Nausea CPT copyright 2019 American Medical Association. All rights reserved. The codes documented in this report are preliminary and upon coder review may  be revised to meet current compliance requirements. [name] MD, MD 01/17/2020 1:06:37 PM This report has been signed electronically. Number of Addenda: 0 Note Initiated On: 01/17/2020 12:55 PM Estimated Blood Loss:  Estimated blood loss: none.  [facility]

## 2020-01-17 NOTE — Transfer of Care (Signed)
Immediate Anesthesia Transfer of Care Note  Patient: [name]  Procedure(s) Performed: ESOPHAGOGASTRODUODENOSCOPY (EGD) WITH PROPOFOL (N/A )  Patient Location: PACU  Anesthesia Type:General  Level of Consciousness: sedated  Airway & Oxygen Therapy: Patient Spontanous Breathing  Post-op Assessment: Report given to RN and Post -op Vital signs reviewed and stable  Post vital signs: Reviewed and stable  Last Vitals:  Vitals Value Taken Time  BP    Temp    Pulse    Resp    SpO2      Last Pain:  Vitals:   01/17/20 1145  TempSrc: Temporal  PainSc: 0-No pain         Complications: No apparent anesthesia complications

## 2020-01-17 NOTE — Anesthesia Preprocedure Evaluation (Signed)
Anesthesia Evaluation  Patient identified by MRN, date of birth, ID band Patient awake    Reviewed: Allergy & Precautions, H&P , NPO status , Patient's Chart, lab work & pertinent test results, reviewed documented beta blocker date and time   History of Anesthesia Complications (+) PONV and history of anesthetic complications  Airway Mallampati: II  TM Distance: >3 FB Neck ROM: full    Dental no notable dental hx. (+) Teeth Intact   Pulmonary shortness of breath and with exertion, neg sleep apnea, neg COPD, neg recent URI,    Pulmonary exam normal breath sounds clear to auscultation       Cardiovascular Exercise Tolerance: Good hypertension, (-) angina(-) Past MI and (-) Cardiac Stents (-) dysrhythmias (-) Valvular Problems/Murmurs Rhythm:regular Rate:Normal     Neuro/Psych negative neurological ROS  negative psych ROS   GI/Hepatic Neg liver ROS, GERD  Medicated,  Endo/Other  neg diabetesHypothyroidism Morbid obesity  Renal/GU      Musculoskeletal   Abdominal   Peds  Hematology  (+) Blood dyscrasia, ,   Anesthesia Other Findings Past Medical History: No date: Anemia 09/2015: Cancer ([place])     Comment:  breast cancer No date: GERD (gastroesophageal reflux disease) No date: History of chicken pox No date: Hyperlipidemia No date: Hypertension No date: Hypothyroidism No date: Neuropathy of foot     Comment:  bilateral No date: Pinched nerve No date: PONV (postoperative nausea and vomiting)     Comment:  exploratory surgery at [facility] No date: Thyroid disease No date: Tuberculosis exposure     Comment:  Mother had TB.  Pt shows (+) on tests, but has never               had. No date: Vertigo     Comment:  last episode over 1 yr ago No date: Wears dentures     Comment:  partial upper and lower   Reproductive/Obstetrics negative OB ROS                             Anesthesia  Physical  Anesthesia Plan  ASA: III  Anesthesia Plan: General   Post-op Pain Management:    Induction: Intravenous  PONV Risk Score and Plan: 4 or greater and Propofol infusion and TIVA  Airway Management Planned: Natural Airway and Nasal Cannula  Additional Equipment:   Intra-op Plan:   Post-operative Plan:   Informed Consent: I have reviewed the patients History and Physical, chart, labs and discussed the procedure including the risks, benefits and alternatives for the proposed anesthesia with the patient or authorized representative who has indicated his/her understanding and acceptance.       Plan Discussed with: CRNA  Anesthesia Plan Comments:         Anesthesia Quick Evaluation

## 2020-01-17 NOTE — Anesthesia Postprocedure Evaluation (Signed)
Anesthesia Post Note  Patient: Kaylee Reyes  Procedure(s) Performed: ESOPHAGOGASTRODUODENOSCOPY (EGD) WITH PROPOFOL (N/A )  Patient location during evaluation: Endoscopy Anesthesia Type: General Level of consciousness: awake and alert Pain management: pain level controlled Vital Signs Assessment: post-procedure vital signs reviewed and stable Respiratory status: spontaneous breathing, nonlabored ventilation, respiratory function stable and patient connected to nasal cannula oxygen Cardiovascular status: blood pressure returned to baseline and stable Postop Assessment: no apparent nausea or vomiting Anesthetic complications: no     Last Vitals:  Vitals:   01/17/20 1320 01/17/20 1330  BP: (!) 141/77 (!) 156/77  Pulse: 67 67  Resp: 16 14  Temp:    SpO2: 97% 100%    Last Pain:  Vitals:   01/17/20 1330  TempSrc:   PainSc: 0-No pain                 Martha Clan

## 2020-01-17 NOTE — Interval H&P Note (Signed)
History and Physical Interval Note:  01/17/2020 11:36 AM  Kaylee Reyes  has presented today for surgery, with the diagnosis of GERD K21.9.  The various methods of treatment have been discussed with the patient and family. After consideration of risks, benefits and other options for treatment, the patient has consented to  Procedure(s): ESOPHAGOGASTRODUODENOSCOPY (EGD) WITH PROPOFOL (N/A) as a surgical intervention.  The patient's history has been reviewed, patient examined, no change in status, stable for surgery.  I have reviewed the patient's chart and labs.  Questions were answered to the patient's satisfaction.     Vann Okerlund [employer]

## 2020-01-18 ENCOUNTER — Encounter: Payer: Self-pay | Admitting: *Deleted

## 2020-01-24 NOTE — Progress Notes (Signed)
[place]  [phone] [phone]  ID: Kaylee Reyes OB: 07-10-1951  MR#: [id number]  [id number]  Patient Care Team: Birdie Sons, MD as PCP - General (Family Medicine) [name], MD as Consulting Physician (Gastroenterology) Diamond Nickel, DO as Consulting Physician (Sports Medicine) Sharlet Salina, MD as Referring Physician (Physical Medicine and Rehabilitation) Olean Ree, MD as Consulting Physician (General Surgery) Lloyd Huger, MD as Consulting Physician (Oncology) Noreene Filbert, MD as Referring Physician (Radiation Oncology) Thornton Park, MD as Referring Physician (Orthopedic Surgery)  I connected with Kaylee Reyes on 02/01/20 at  2:45 PM EST by video enabled telemedicine visit and verified that I am speaking with the correct person using two identifiers.   I discussed the limitations, risks, security and privacy concerns of performing an evaluation and management service by telemedicine and the availability of in-person appointments. I also discussed with the patient that there may be a patient responsible charge related to this service. The patient expressed understanding and agreed to proceed.   Other persons participating in the visit and their role in the encounter: Patient, MD.  Patient's location: Home. Provider's location: Clinic.  CHIEF COMPLAINT: Pathologic stage IIB ER/PR positive invasive lobular carcinoma of the right upper breast.  INTERVAL HISTORY: Patient agreed to video enabled telemedicine visit for routine 32-month evaluation.  She currently feels well and is asymptomatic.  She continues to tolerate letrozole without significant side effects.  She has no neurologic complaints.  She denies any recent fevers or illnesses.  She denies any chest pain, shortness of breath, cough, or hemoptysis.  She has no nausea, vomiting, constipation, or diarrhea. She has no urinary complaints.   Patient offers no specific complaints today.  REVIEW OF SYSTEMS:   Review of Systems  Constitutional: Negative.  Negative for fever, malaise/fatigue and weight loss.  Respiratory: Negative.  Negative for cough and shortness of breath.   Cardiovascular: Negative.  Negative for chest pain and leg swelling.  Gastrointestinal: Negative.  Negative for abdominal pain and constipation.  Genitourinary: Negative.  Negative for dysuria.  Musculoskeletal: Negative.  Negative for back pain and joint pain.  Skin: Negative.  Negative for rash.  Neurological: Positive for sensory change. Negative for weakness.  Psychiatric/Behavioral: Negative.  Negative for memory loss. The patient is not nervous/anxious.     As per HPI. Otherwise, a complete review of systems is negative.  PAST MEDICAL HISTORY: Past Medical History:  Diagnosis Date  . Anemia   . Cancer ([place]) 09/2015   breast cancer  . GERD (gastroesophageal reflux disease)   . History of chicken pox   . Hyperlipidemia   . Hypertension   . Hypothyroidism   . Neuropathy of foot    bilateral  . Pinched nerve   . PONV (postoperative nausea and vomiting)    exploratory surgery at [facility]  . Thyroid disease   . Tuberculosis exposure    Mother had TB.  Pt shows (+) on tests, but has never had.  . Vertigo    last episode over 1 yr ago  . Wears dentures    partial upper and lower    PAST SURGICAL HISTORY: Past Surgical History:  Procedure Laterality Date  . ABDOMINAL HYSTERECTOMY  07/13/2013   Total. with BSO for postmenopausal bleeding and fibroids with large cervical polyp. Dr. Levy Sjogren and Dr. Glennon Mac at [facility]  . ABDOMINAL ULTRASOUND  04/17/2011   Hepatomegaly with borderline slenomegaly. Suggestive of fatty liver. s/p cholecystectomy. Portions of aorta obscured  .  BREAST SURGERY Right 2011   BREAST BIOPSY  . CESAREAN SECTION  1988  . CHOLECYSTECTOMY  1990  . COLONOSCOPY WITH PROPOFOL N/A 09/01/2017   Procedure: COLONOSCOPY WITH PROPOFOL;   Surgeon: [name], MD;  Location: [facility];  Service: Gastroenterology;  Laterality: N/A;  . DIAGNOSTIC LAPAROSCOPY    . ESOPHAGOGASTRODUODENOSCOPY (EGD) WITH PROPOFOL N/A 01/17/2020   Procedure: ESOPHAGOGASTRODUODENOSCOPY (EGD) WITH PROPOFOL;  Surgeon: [name], MD;  Location[facility] ENDOSCOPY;  Service: Endoscopy;  Laterality: N/A;  . EXPLORATORY LAPAROTOMY     Left breast  . MASTECTOMY    . MASTECTOMY W/ SENTINEL NODE BIOPSY Bilateral 10/28/2015   Procedure: MASTECTOMY WITH SENTINEL LYMPH NODE BIOPSY;  Surgeon: Hubbard Robinson, MD;  Location: [facility] ORS;  Service: General;  Laterality: Bilateral;  . PORT-A-CATH REMOVAL Left 10/11/2016   Procedure: REMOVAL PORT-A-CATH;  Surgeon: Hubbard Robinson, MD;  Location: [facility] ORS;  Service: General;  Laterality: Left;  . PORTACATH PLACEMENT Left 12/23/2015   Procedure: INSERTION PORT-A-CATH;  Surgeon: Hubbard Robinson, MD;  Location: [facility] ORS;  Service: General;  Laterality: Left;  . WRIST SURGERY      FAMILY HISTORY Family History  Problem Relation Age of Onset  . Hypertension Mother   . Thyroid disease Mother   . Hypertension Brother   . Diabetes Brother   . Breast cancer Neg Hx   . Ovarian cancer Neg Hx   . Colon cancer Neg Hx        ADVANCED DIRECTIVES:    HEALTH MAINTENANCE: Social History   Tobacco Use  . Smoking status: Never Smoker  . Smokeless tobacco: Never Used  Substance Use Topics  . Alcohol use: No    Alcohol/week: 0.0 standard drinks  . Drug use: No     Colonoscopy:  PAP:  Bone density:  Lipid panel:  Allergies  Allergen Reactions  . Penicillins Shortness Of Breath    Has patient had a PCN reaction causing immediate rash, facial/tongue/throat swelling, SOB or lightheadedness with hypotension: Yes Has patient had a PCN reaction causing severe rash involving mucus membranes or skin necrosis: No Has patient had a PCN reaction that required hospitalization No Has patient had a PCN reaction  occurring within the last 10 years: No If all of the above answers are "NO", then may proceed with Cephalosporin use.   . Nickel Hives  . Pravastatin Sodium Other (See Comments)    Myalgia     Current Outpatient Medications  Medication Sig Dispense Refill  . albuterol (VENTOLIN HFA) 108 (90 Base) MCG/ACT inhaler INHALE 2 PUFFS BY MOUTH EVERY 6 HOURS AS NEEDED WHEEZING OR SHORTNESS OF BREATH 18 g 3  . atenolol (TENORMIN) 50 MG tablet TAKE 1 TABLET DAILY 90 tablet 3  . B Complex-C (SUPER B COMPLEX PO) Take 1 tablet by mouth daily.    Marland Kitchen CALCIUM CITRATE PO Take 1 tablet by mouth daily.    . Cholecalciferol 50 MCG (2000 UT) TABS Take by mouth.    . docusate sodium (COLACE) 100 MG capsule Take 200 mg by mouth daily.     Marland Kitchen ELDERBERRY PO Take by mouth.    . hydrochlorothiazide (HYDRODIURIL) 25 MG tablet TAKE 1 TABLET DAILY 90 tablet 3  . ibuprofen (ADVIL,MOTRIN) 200 MG tablet Take 400 mg by mouth every 6 (six) hours as needed for mild pain.    Marland Kitchen ketoconazole (NIZORAL) 2 % cream Apply 1 application topically daily. 50 g 3  . lansoprazole (PREVACID) 30 MG capsule Take 1 capsule (30  mg total) by mouth daily at 12 noon. 30 capsule 3  . letrozole (FEMARA) 2.5 MG tablet TAKE 1 TABLET DAILY (ACCORD BRAND ONLY) 90 tablet 3  . levothyroxine (SYNTHROID) 75 MCG tablet TAKE 1 TABLET DAILY 90 tablet 3  . Loratadine (CLARITIN) 10 MG CAPS Take by mouth.    . Magnesium 400 MG TABS Take 600 mg by mouth daily.     . meclizine (ANTIVERT) 25 MG tablet TAKE 1 TABLET BY MOUTH EVERY 4 TO 6 HOURS AS NEEDED FOR NAUSEA 30 tablet 5  . Misc Natural Products (TURMERIC CURCUMIN) CAPS Take 1 capsule by mouth daily.    Marland Kitchen nystatin (NYSTATIN) powder Apply topically 4 (four) times daily. 15 g 3  . Olopatadine HCl (PATADAY OP) Apply to eye.    . triamcinolone cream (KENALOG) 0.5 % Apply 1 application topically 2 (two) times daily as needed. For itching 30 g 0  . Vitamin D, Ergocalciferol, (DRISDOL) 1.25 MG (50000 UNIT) CAPS  capsule TAKE 1 CAPSULE BY MOUTH 1 TIME A WEEK 12 capsule 0   No current facility-administered medications for this visit.   Facility-Administered Medications Ordered in Other Visits  Medication Dose Route Frequency Provider Last Rate Last Admin  . sodium chloride flush (NS) 0.9 % injection 10 mL  10 mL Intravenous PRN Lloyd Huger, MD   10 mL at 02/09/16 0930  . sodium chloride flush (NS) 0.9 % injection 10 mL  10 mL Intravenous PRN Lloyd Huger, MD   10 mL at 06/21/16 0950    OBJECTIVE: There were no vitals filed for this visit.   There is no height or weight on file to calculate BMI.    ECOG FS:0 - Asymptomatic  General: Well-developed, well-nourished, no acute distress. HEENT: Normocephalic. Neuro: Alert, answering all questions appropriately. Cranial nerves grossly intact. Psych: Normal affect.   LAB RESULTS:  Lab Results  Component Value Date   NA 139 11/20/2019   K 4.0 11/20/2019   CL 98 11/20/2019   CO2 25 11/20/2019   GLUCOSE 104 (H) 11/20/2019   BUN 12 11/20/2019   CREATININE 0.80 11/20/2019   CALCIUM 10.2 11/20/2019   PROT 7.4 11/20/2019   ALBUMIN 4.6 11/20/2019   AST 28 11/20/2019   ALT 35 (H) 11/20/2019   ALKPHOS 103 11/20/2019   BILITOT 0.4 11/20/2019   GFRNONAA 77 11/20/2019   GFRAA 88 11/20/2019    Lab Results  Component Value Date   WBC 8.4 11/20/2019   NEUTROABS 6.4 04/04/2017   HGB 12.4 11/20/2019   HCT 36.7 11/20/2019   MCV 86 11/20/2019   PLT 201 11/20/2019   Lab Results  Component Value Date   IRON 48 02/09/2016   TIBC 326 02/09/2016   IRONPCTSAT 15 02/09/2016     STUDIES: No results found.  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  PLAN:    1. Pathologic stage IIB ER/PR positive invasive lobular carcinoma of the right upper breast: Patient is now status post bilateral mastectomy, therefore does not require additional mammograms. She completed 4 cycles of  Taxotere and Cytoxan on March 03, 2016.  She also completed adjuvant XRT.  Continue letrozole for a total of 5 years completing treatment in July 2022.  Return to clinic in 6 months for routine evaluation.   2.  Bone health: Patient had bone mineral density on July 03, 2018 that reported a T score of 0.5 which is considered normal.  Repeat in July 2021.  I provided 20 minutes of face-to-face video visit time during this encounter which included chart review, counseling, and coordination of care as documented above.   Patient expressed understanding and was in agreement with this plan. She also understands that She can call clinic at any time with any questions, concerns, or complaints.   Invasive lobular carcinoma of right breast, stage 2 ([place])   Staging form: Breast, AJCC 7th Edition     Pathologic stage: Stage IIB (T2, N1a, M0) - Signed by Lloyd Huger, MD on 10/22/2015   Lloyd Huger, MD   02/01/2020 4:00 PM

## 2020-01-28 ENCOUNTER — Other Ambulatory Visit: Payer: Self-pay | Admitting: Oncology

## 2020-01-31 ENCOUNTER — Encounter: Payer: Self-pay | Admitting: Oncology

## 2020-01-31 ENCOUNTER — Other Ambulatory Visit: Payer: Self-pay

## 2020-01-31 ENCOUNTER — Inpatient Hospital Stay: Payer: Medicare Other | Attending: Oncology | Admitting: Oncology

## 2020-01-31 DIAGNOSIS — C50811 Malignant neoplasm of overlapping sites of right female breast: Secondary | ICD-10-CM | POA: Insufficient documentation

## 2020-01-31 DIAGNOSIS — C50911 Malignant neoplasm of unspecified site of right female breast: Secondary | ICD-10-CM

## 2020-01-31 DIAGNOSIS — Z170 Estrogen receptor positive status [ER+]: Secondary | ICD-10-CM | POA: Insufficient documentation

## 2020-01-31 DIAGNOSIS — Z79811 Long term (current) use of aromatase inhibitors: Secondary | ICD-10-CM | POA: Insufficient documentation

## 2020-01-31 NOTE — Progress Notes (Signed)
Patient verified using two identifiers for virtual visit via telephone today.  Patient does not offer any problems today.  

## 2020-02-17 ENCOUNTER — Other Ambulatory Visit: Payer: Self-pay | Admitting: Family Medicine

## 2020-02-17 DIAGNOSIS — E7800 Pure hypercholesterolemia, unspecified: Secondary | ICD-10-CM

## 2020-02-20 IMAGING — MR MR CERVICAL SPINE W/O CM
5 series · 38 of 48 positions shown · non-contrast
Comparison: None.

CLINICAL DATA: Left-sided neck pain and decreased range of motion.

EXAM:
MRI CERVICAL SPINE WITHOUT CONTRAST
TECHNIQUE: Multiplanar, multisequence MR imaging of the cervical spine was
performed. No intravenous contrast was administered.

[Series 5: T2 · sagittal · 3.0mm · 0.62mm/px · 6 of 15 slices shown (1 of 2)]
[im 1/15]
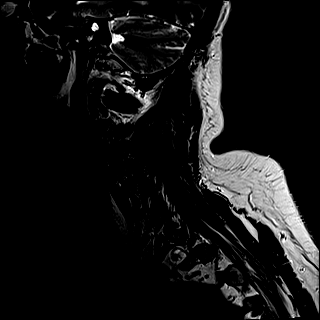
[im 3/15]
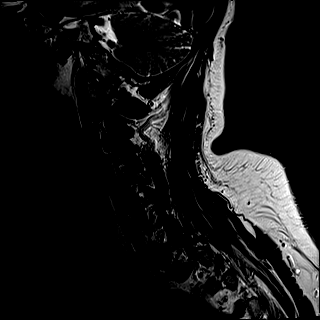
[im 6/15]
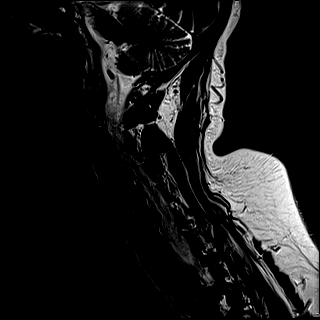
[im 9/15]
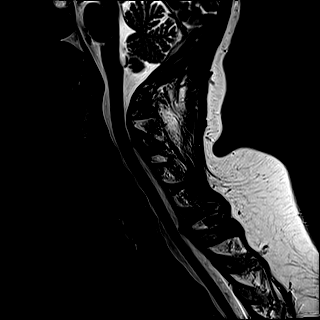
[im 12/15]
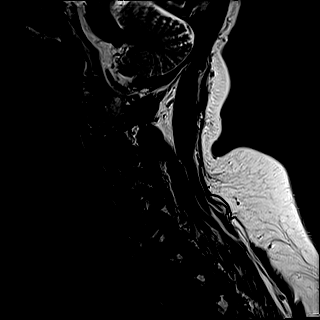
[im 15/15]
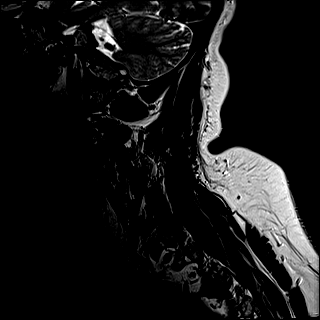

[Series 6: FLAIR · sagittal · 3.0mm · 0.78mm/px · 7 of 15 slices shown]
[im 1/15]
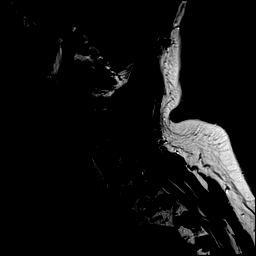
[im 3/15]
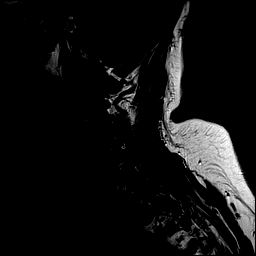
[im 5/15]
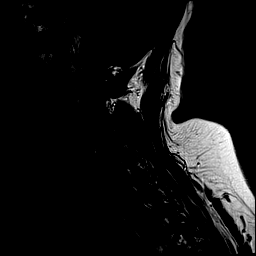
[im 8/15]
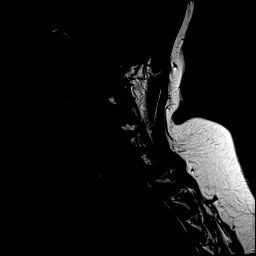
[im 10/15]
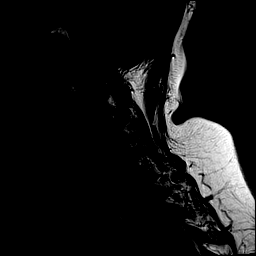
[im 12/15]
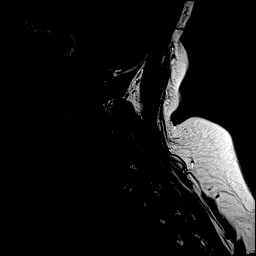
[im 15/15]
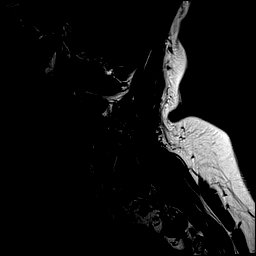

[Series 7: STIR · sagittal · 3.0mm · 0.62mm/px · 7 of 15 slices shown]
[im 1/15]
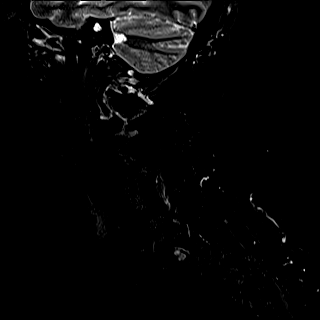
[im 3/15]
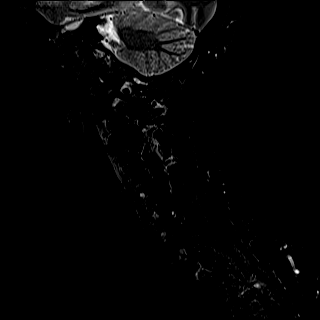
[im 5/15]
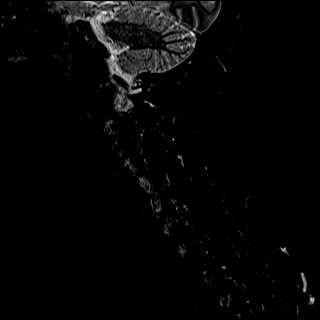
[im 8/15]
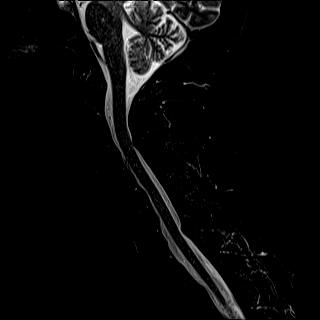
[im 10/15]
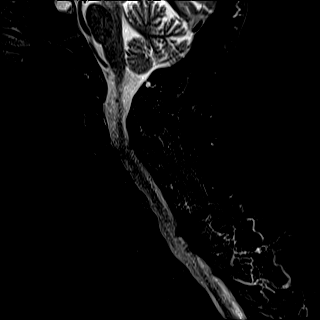
[im 12/15]
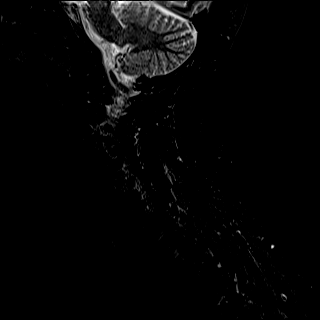
[im 15/15]
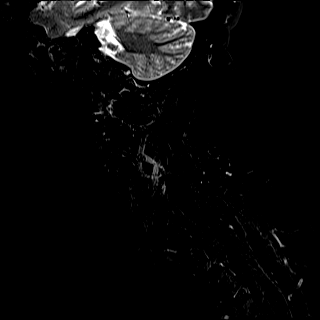

[Series 8: T2 · axial · 3.0mm · 0.70mm/px · z∈[-118,-27]mm · 10 of 29 slices shown (2 of 2)]
[im 1/29]
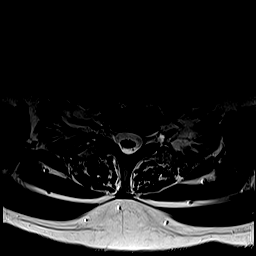
[im 3/29]
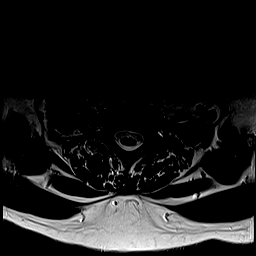
[im 5/29]
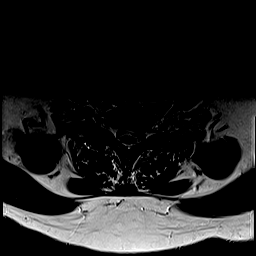
[im 7/29]
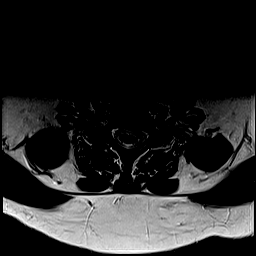
[im 9/29]
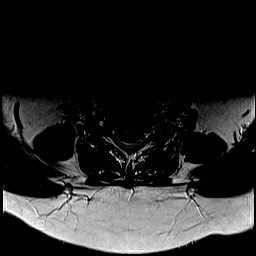
[im 13/29]
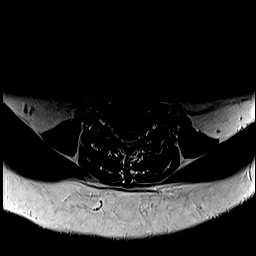
[im 16/29]
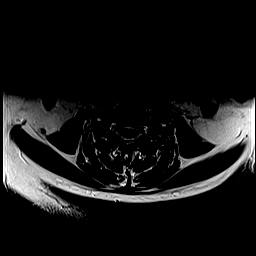
[im 20/29]
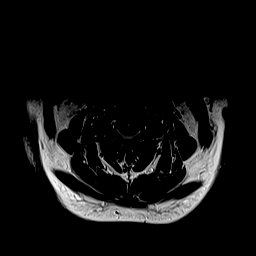
[im 24/29]
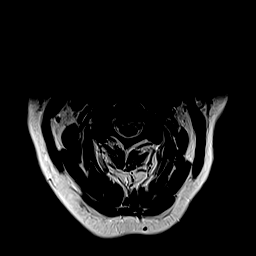
[im 29/29]
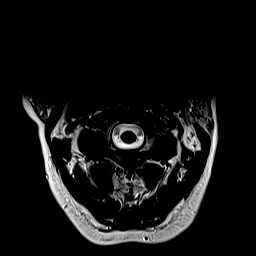

[Series 9: ax mpgr · axial · 3.0mm · 0.35mm/px · z∈[-118,-27]mm · 8 of 29 slices shown]
[im 1/29]
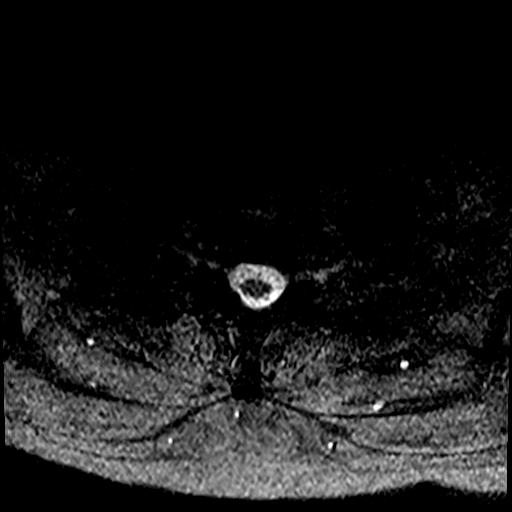
[im 5/29]
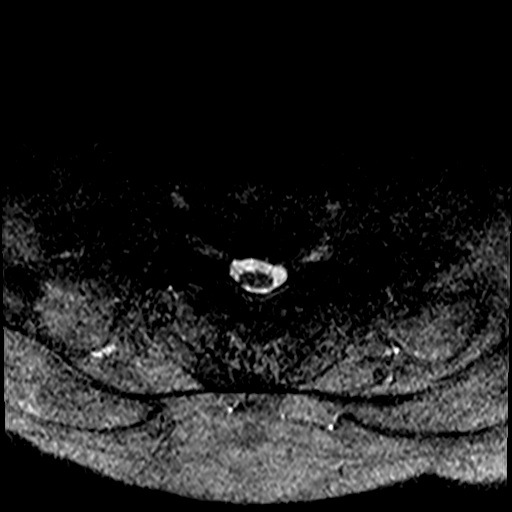
[im 9/29]
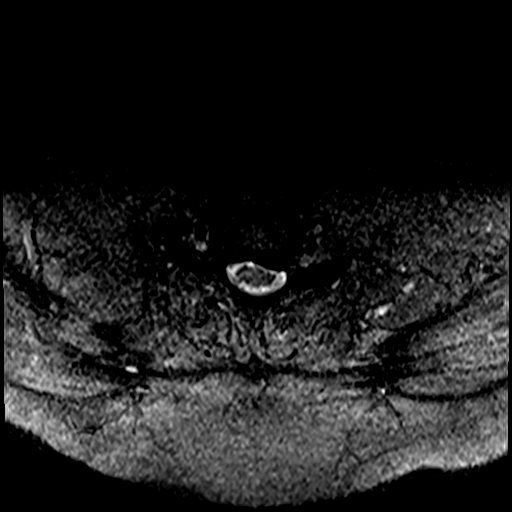
[im 13/29]
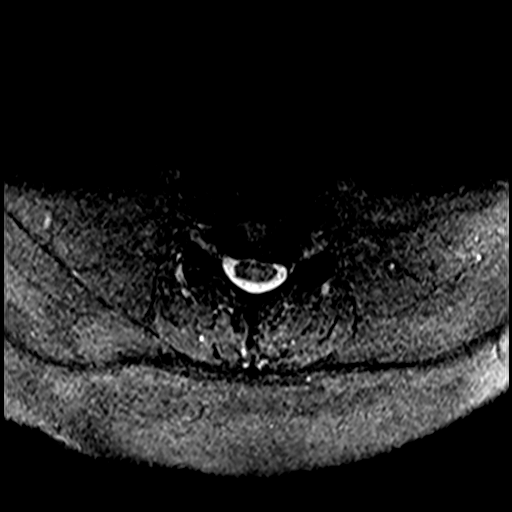
[im 16/29]
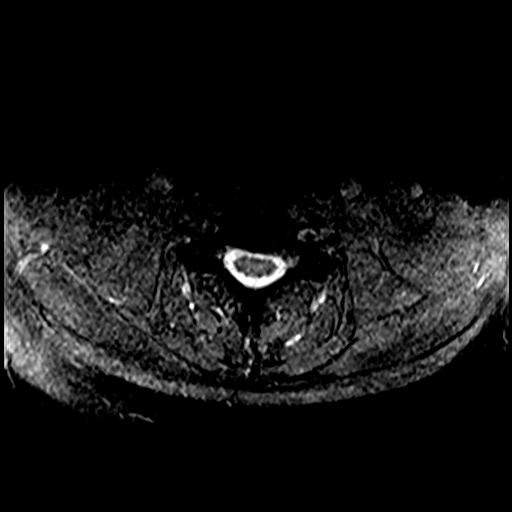
[im 20/29]
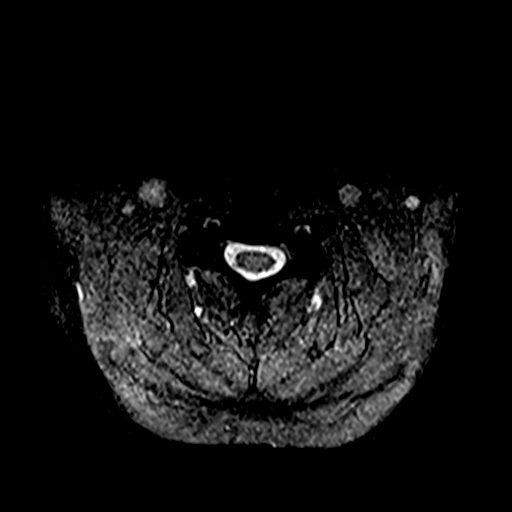
[im 24/29]
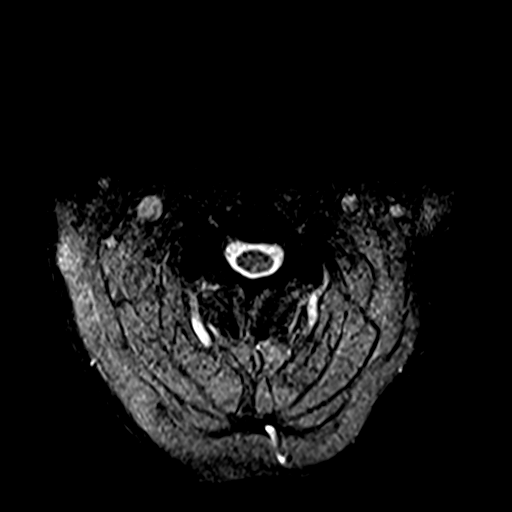
[im 29/29]
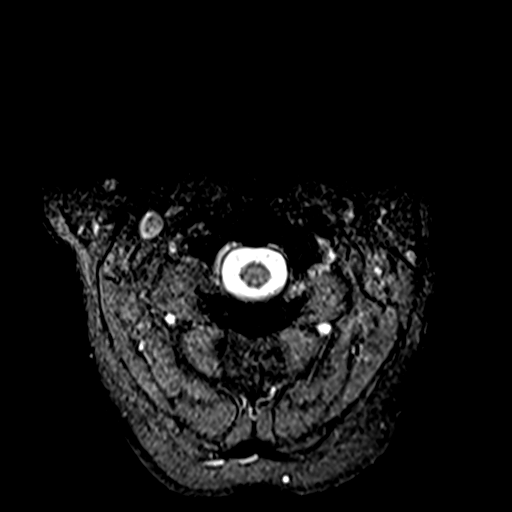

[38 of 48 positions shown; findings below may reference images not displayed]

FINDINGS: Alignment: Normal overall alignment. There is degenerative cervical
spondylosis with mild multilevel degenerative subluxations.

Vertebrae: Normal marrow signal. No bone lesions or fractures. Mild
endplate reactive changes.

Cord: Normal cord signal intensity.  No cord lesions or syrinx.

Posterior Fossa, vertebral arteries, paraspinal tissues: No
significant findings.

Disc levels:

C2-3: Asymmetric right-sided facet disease contributing to mild
right foraminal narrowing. No disc protrusions or spinal stenosis.

C3-4: Bulging annulus and moderate bilateral facet disease with mild
narrowing the ventral CSF space and mild bilateral foraminal
narrowing, right greater than left.

C4-5: Degenerative disc disease with a bulging annulus, osteophytic
ridging and uncinate spurring. There is mild flattening of the
ventral thecal sac and slight narrowing the ventral CSF space. Mild
foraminal encroachment bilaterally also.

C5-6: Degenerative disc disease with a bulging annulus, osteophytic
ridging and uncinate spurring. There is mass effect on the ventral
thecal sac and narrowing the ventral CSF space. There is also mild
bilateral foraminal stenosis.

C6-7: Bulging annulus and shallow left paracentral disc protrusion
with mild mass effect on the left side of the thecal sac. Mild
foraminal narrowing bilaterally, right greater than left

C7-T1: No significant findings.
IMPRESSION: 1. Mild degenerative cervical spondylosis with multilevel disc
disease and facet disease.
2. Shallow left paracentral disc protrusion at C6-7 with mild mass
effect on the left side of thecal sac.
3. Mild multilevel foraminal stenosis as detailed above

## 2020-03-13 ENCOUNTER — Encounter: Payer: Self-pay | Admitting: Obstetrics and Gynecology

## 2020-03-13 ENCOUNTER — Ambulatory Visit (INDEPENDENT_AMBULATORY_CARE_PROVIDER_SITE_OTHER): Payer: Medicare Other | Admitting: Obstetrics and Gynecology

## 2020-03-13 ENCOUNTER — Other Ambulatory Visit: Payer: Self-pay

## 2020-03-13 VITALS — BP 173/106 | HR 72 | Ht 66.0 in | Wt 282.7 lb

## 2020-03-13 DIAGNOSIS — Z853 Personal history of malignant neoplasm of breast: Secondary | ICD-10-CM | POA: Diagnosis not present

## 2020-03-13 DIAGNOSIS — E7800 Pure hypercholesterolemia, unspecified: Secondary | ICD-10-CM | POA: Diagnosis not present

## 2020-03-13 DIAGNOSIS — N952 Postmenopausal atrophic vaginitis: Secondary | ICD-10-CM | POA: Diagnosis not present

## 2020-03-13 DIAGNOSIS — Z9013 Acquired absence of bilateral breasts and nipples: Secondary | ICD-10-CM

## 2020-03-13 DIAGNOSIS — Z6841 Body Mass Index (BMI) 40.0 and over, adult: Secondary | ICD-10-CM

## 2020-03-13 DIAGNOSIS — Z780 Asymptomatic menopausal state: Secondary | ICD-10-CM

## 2020-03-13 NOTE — Progress Notes (Signed)
Pt present for annual exam. Pt stated that she was doing well no problems.  

## 2020-03-13 NOTE — Progress Notes (Signed)
ANNUAL PREVENTATIVE CARE GYNECOLOGY  ENCOUNTER NOTE  Subjective:       Kaylee Reyes is a 69 y.o. married postmenopausal [phone] female here for a routine annual gynecologic exam. She was previously a patient of Dr. Hassell Done Defrancesco who has retired. She has a past history significant for breast cancer, s/p bilateral mastectomy in 2016, currently on Letrazole chemotherapy.  The patient has never taken hormone replacement therapy. Patient denies post-menopausal vaginal bleeding. The patient wears seatbelts: yes.  Has the patient ever been transfused or tattooed?: no.  Current complaints: 1.  None   Gynecologic History No LMP recorded. Patient has had a hysterectomy. Patient is postmenopausal. Contraception: status post hysterectomy and postmenopausal.  Last Pap: 07/2017. Results were: normal Last mammogram: 08/15/2015. Last Colonoscopy: 09/01/2017. Results were: normal, except findings of diverticulae.  Repeat in 10 years.  Last Dexa Scan: 07/03/2018. Results were: Normal.    Obstetric History OB History  Gravida Para Term Preterm AB Living  3 2 2   1 2   SAB TAB Ectopic Multiple Live Births  1       2    # Outcome Date GA Lbr Len/2nd Weight Sex Delivery Anes PTL Lv  3 Term 1988   11 lb 14.4 oz (5.398 kg) F CS-LTranv   LIV  2 SAB 1976          1 Term 1975   9 lb 12.8 oz (4.445 kg) F Vag-Spont   LIV    Past Medical History:  Diagnosis Date  . Anemia   . Cancer ([place]) 09/2015   breast cancer  . GERD (gastroesophageal reflux disease)   . History of chicken pox   . Hyperlipidemia   . Hypertension   . Hypothyroidism   . Neuropathy of foot    bilateral  . Pinched nerve   . PONV (postoperative nausea and vomiting)    exploratory surgery at [facility]  . Thyroid disease   . Tuberculosis exposure    Mother had TB.  Pt shows (+) on tests, but has never had.  . Vertigo    last episode over 1 yr ago  . Wears dentures    partial upper and lower    Family History  Problem Relation  Age of Onset  . Hypertension Mother   . Thyroid disease Mother   . Hypertension Brother   . Diabetes Brother   . Breast cancer Neg Hx   . Ovarian cancer Neg Hx   . Colon cancer Neg Hx     Past Surgical History:  Procedure Laterality Date  . ABDOMINAL HYSTERECTOMY  07/13/2013   Total. with BSO for postmenopausal bleeding and fibroids with large cervical polyp. Dr. Levy Sjogren and Dr. Glennon Mac at [facility]  . ABDOMINAL ULTRASOUND  04/17/2011   Hepatomegaly with borderline slenomegaly. Suggestive of fatty liver. s/p cholecystectomy. Portions of aorta obscured  . BREAST SURGERY Right 2011   BREAST BIOPSY  . CESAREAN SECTION  1988  . CHOLECYSTECTOMY  1990  . COLONOSCOPY WITH PROPOFOL N/A 09/01/2017   Procedure: COLONOSCOPY WITH PROPOFOL;  Surgeon: [name], MD;  Location: [place];  Service: Gastroenterology;  Laterality: N/A;  . DIAGNOSTIC LAPAROSCOPY    . ESOPHAGOGASTRODUODENOSCOPY (EGD) WITH PROPOFOL N/A 01/17/2020   Procedure: ESOPHAGOGASTRODUODENOSCOPY (EGD) WITH PROPOFOL;  Surgeon: [name], MD;  Location: [facility] ENDOSCOPY;  Service: Endoscopy;  Laterality: N/A;  . EXPLORATORY LAPAROTOMY     Left breast  . MASTECTOMY    . MASTECTOMY W/ SENTINEL NODE BIOPSY Bilateral 10/28/2015  Procedure: MASTECTOMY WITH SENTINEL LYMPH NODE BIOPSY;  Surgeon: Hubbard Robinson, MD;  Location: [facility] ORS;  Service: General;  Laterality: Bilateral;  . PORT-A-CATH REMOVAL Left 10/11/2016   Procedure: REMOVAL PORT-A-CATH;  Surgeon: Hubbard Robinson, MD;  Location: [facility] OR[facility];  Service: General;  Laterality: Left;  . PORTACATH PLACEMENT Left 12/23/2015   Procedure: INSERTION PORT-A-CATH;  Surgeon: Hubbard Robinson, MD;  Location: [facility] ORS;  Service: General;  Laterality: Left;  . WRIST SURGERY      Social History   Socioeconomic History  . Marital status: Married    Spouse name: Not on file  . Number of children: 2  . Years of education: Not on file  . Highest education level: Some college,  no degree  Occupational History  . Occupation: retired  Tobacco Use  . Smoking status: Never Smoker  . Smokeless tobacco: Never Used  Substance and Sexual Activity  . Alcohol use: No    Alcohol/week: 0.0 standard drinks  . Drug use: No  . Sexual activity: Never    Birth control/protection: Surgical  Other Topics Concern  . Not on file  Social History Narrative  . Not on file   Social Determinants of Health   Financial Resource Strain: Low Risk   . Difficulty of Paying Living Expenses: Not hard at all  Food Insecurity: No Food Insecurity  . Worried About Charity fundraiser in the Last Year: Never true  . Ran Out of Food in the Last Year: Never true  Transportation Needs: No Transportation Needs  . Lack of Transportation (Medical): No  . Lack of Transportation (Non-Medical): No  Physical Activity:   . Days of Exercise per Week:   . Minutes of Exercise per Session:   Stress: No Stress Concern Present  . Feeling of Stress : Not at all  Social Connections: Unknown  . Frequency of Communication with Friends and Family: Patient refused  . Frequency of Social Gatherings with Friends and Family: Patient refused  . Attends Religious Services: Patient refused  . Active Member of Clubs or Organizations: Patient refused  . Attends Archivist Meetings: Patient refused  . Marital Status: Patient refused  Intimate Partner Violence: Unknown  . Fear of Current or Ex-Partner: Patient refused  . Emotionally Abused: Patient refused  . Physically Abused: Patient refused  . Sexually Abused: Patient refused    Current Outpatient Medications on File Prior to Visit  Medication Sig Dispense Refill  . albuterol (VENTOLIN HFA) 108 (90 Base) MCG/ACT inhaler INHALE 2 PUFFS BY MOUTH EVERY 6 HOURS AS NEEDED WHEEZING OR SHORTNESS OF BREATH 18 g 3  . atenolol (TENORMIN) 50 MG tablet TAKE 1 TABLET DAILY 90 tablet 3  . B Complex-C (SUPER B COMPLEX PO) Take 1 tablet by mouth daily.    Marland Kitchen  CALCIUM CITRATE PO Take 1 tablet by mouth daily.    . Cholecalciferol 50 MCG (2000 UT) TABS Take by mouth.    . docusate sodium (COLACE) 100 MG capsule Take 200 mg by mouth daily.     Marland Kitchen ELDERBERRY PO Take by mouth.    . fexofenadine (ALLEGRA) 180 MG tablet Take 180 mg by mouth daily.    . hydrochlorothiazide (HYDRODIURIL) 25 MG tablet TAKE 1 TABLET DAILY 90 tablet 3  . ibuprofen (ADVIL,MOTRIN) 200 MG tablet Take 400 mg by mouth every 6 (six) hours as needed for mild pain.    Marland Kitchen ketoconazole (NIZORAL) 2 % cream Apply 1 application topically daily. 50 g 3  .  lansoprazole (PREVACID) 30 MG capsule Take 1 capsule (30 mg total) by mouth daily at 12 noon. 30 capsule 3  . letrozole (FEMARA) 2.5 MG tablet TAKE 1 TABLET DAILY (ACCORD BRAND ONLY) 90 tablet 3  . levothyroxine (SYNTHROID) 75 MCG tablet TAKE 1 TABLET DAILY 90 tablet 3  . Magnesium 400 MG TABS Take 600 mg by mouth daily.     . meclizine (ANTIVERT) 25 MG tablet TAKE 1 TABLET BY MOUTH EVERY 4 TO 6 HOURS AS NEEDED FOR NAUSEA 30 tablet 5  . Misc Natural Products (TURMERIC CURCUMIN) CAPS Take 1 capsule by mouth daily.    Marland Kitchen nystatin (NYSTATIN) powder Apply topically 4 (four) times daily. 15 g 3  . Olopatadine HCl (PATADAY OP) Apply to eye.    . triamcinolone cream (KENALOG) 0.5 % Apply 1 application topically 2 (two) times daily as needed. For itching 30 g 0  . Vitamin D, Ergocalciferol, (DRISDOL) 1.25 MG (50000 UNIT) CAPS capsule TAKE 1 CAPSULE BY MOUTH 1 TIME A WEEK 12 capsule 0  . Loratadine (CLARITIN) 10 MG CAPS Take by mouth.     Current Facility-Administered Medications on File Prior to Visit  Medication Dose Route Frequency Provider Last Rate Last Admin  . sodium chloride flush (NS) 0.9 % injection 10 mL  10 mL Intravenous PRN Lloyd Huger, MD   10 mL at 02/09/16 0930  . sodium chloride flush (NS) 0.9 % injection 10 mL  10 mL Intravenous PRN Lloyd Huger, MD   10 mL at 06/21/16 0950    Allergies  Allergen Reactions  .  Penicillins Shortness Of Breath    Has patient had a PCN reaction causing immediate rash, facial/tongue/throat swelling, SOB or lightheadedness with hypotension: Yes Has patient had a PCN reaction causing severe rash involving mucus membranes or skin necrosis: No Has patient had a PCN reaction that required hospitalization No Has patient had a PCN reaction occurring within the last 10 years: No If all of the above answers are "NO", then may proceed with Cephalosporin use.   . Nickel Hives  . Pravastatin Sodium Other (See Comments)    Myalgia       Review of Systems ROS Review of Systems - General ROS: negative for - chills, fatigue, fever, hot flashes, night sweats, weight gain or weight loss Psychological ROS: negative for - anxiety, decreased libido, depression, mood swings, physical abuse or sexual abuse Ophthalmic ROS: negative for - blurry vision, eye pain or loss of vision ENT ROS: negative for - headaches, hearing change, visual changes or vocal changes Allergy and Immunology ROS: negative for - hives, itchy/watery eyes or seasonal allergies Hematological and Lymphatic ROS: negative for - bleeding problems, bruising, swollen lymph nodes or weight loss Endocrine ROS: negative for - galactorrhea, hair pattern changes, hot flashes, malaise/lethargy, mood swings, palpitations, polydipsia/polyuria, skin changes, temperature intolerance or unexpected weight changes Breast ROS: negative for - new or changing breast lumps or nipple discharge Respiratory ROS: negative for - cough or shortness of breath Cardiovascular ROS: negative for - chest pain, irregular heartbeat, palpitations or shortness of breath Gastrointestinal ROS: no abdominal pain, change in bowel habits, or black or bloody stools. Positive for constipation (managed with stool softeners).  Genito-Urinary ROS: no dysuria, trouble voiding, or hematuria Musculoskeletal ROS: negative for - joint pain or joint  stiffness Neurological ROS: negative for - bowel and bladder control changes Dermatological ROS: negative for rash and skin lesion changes   Objective:   BP (!) 173/106 (BP Location: Left  Arm)   Pulse 72   Ht 5\' 6"  (1.676 m)   Wt 282 lb 11.2 oz (128.2 kg)   BMI 45.63 kg/m .  Repeat BP 170/94.  CONSTITUTIONAL: Well-developed, well-nourished female in no acute distress.  Morbid obesity PSYCHIATRIC: Normal mood and affect. Normal behavior. Normal judgment and thought content. [place]: Alert and oriented to person, place, and time. Normal muscle tone coordination. No cranial nerve deficit noted.  + for occasional vertigo.  HENT:  Normocephalic, atraumatic, External right and left ear normal. Oropharynx is clear and moist EYES: Conjunctivae and EOM are normal. Pupils are equal, round, and reactive to light. No scleral icterus.  NECK: Normal range of motion, supple, no masses.  Normal thyroid.  SKIN: Skin is warm and dry. No rash noted. Not diaphoretic. No erythema. No pallor. CARDIOVASCULAR: Normal heart rate noted, regular rhythm, no murmur. RESPIRATORY: Clear to auscultation bilaterally. Effort and breath sounds normal, no problems with respiration noted. BREASTS: Symmetric in size. No masses, skin changes, nipple drainage, or lymphadenopathy. ABDOMEN: Soft, normal bowel sounds, no distention noted.  No tenderness, rebound or guarding.  BLADDER: Normal PELVIC:  Bladder no bladder distension noted  Urethra: normal appearing urethra with no masses, tenderness or lesions  Vulva: normal appearing vulva with no masses, tenderness or lesions  Vagina: normal appearing vagina with mild vaginal atrophy,  no discharge, no lesions  Cervix: normal appearing cervix without discharge or lesions, Grade 1 prolapse present.   Uterus: surgically absent  Adnexa: surgically absent bilateral  RV: External Exam NormaI, No Rectal Masses and Normal Sphincter tone  MUSCULOSKELETAL: Normal range of motion. No  tenderness.  No cyanosis, clubbing, or edema.  2+ distal pulses. LYMPHATIC: No Axillary, Supraclavicular, or Inguinal Adenopathy.   Labs: Lab Results  Component Value Date   WBC 8.4 11/20/2019   HGB 12.4 11/20/2019   HCT 36.7 11/20/2019   MCV 86 11/20/2019   PLT 201 11/20/2019    Lab Results  Component Value Date   CREATININE 0.80 11/20/2019   BUN 12 11/20/2019   NA 139 11/20/2019   K 4.0 11/20/2019   CL 98 11/20/2019   CO2 25 11/20/2019    Lab Results  Component Value Date   ALT 35 (H) 11/20/2019   AST 28 11/20/2019   ALKPHOS 103 11/20/2019   BILITOT 0.4 11/20/2019    Lab Results  Component Value Date   CHOL 299 (H) 11/20/2019   HDL 48 11/20/2019   LDLCALC 214 (H) 11/20/2019   TRIG 191 (H) 11/20/2019   CHOLHDL 6.2 (H) 11/20/2019    Lab Results  Component Value Date   TSH 1.840 11/20/2019    No results found for: HGBA1C   Assessment:   1. Menopause   2. S/P bilateral mastectomy   3. Vaginal atrophy   4. History of breast cancer in female   5. Hypercholesterolemia   6. Morbid obesity with BMI of 45.0-49.9, adult ([place])     Plan:  Pap: Not needed.  Patient beyond age based screening.  Mammogram: Not Indicated. S/p mastectomy. Currently on Letrazole. Is followed by Oncology/Radiology.  Stool Guaiac Testing:  Not Indicated. Up to date with colonoscopy.  Labs: No labs, previously ordered by PCP Routine preventative health maintenance measures emphasized: Exercise/Diet/Weight control, Tobacco Warnings, Alcohol/Substance use risks, Stress Management.   Vitaimin D and Calcium taken for prevention of osteoporosis. Normal Dexa scan.  Hyperlipidemia managed by PCP.  Return to [place] or every other year.    Rubie Maid, MD  [facility]

## 2020-03-14 ENCOUNTER — Encounter: Payer: Self-pay | Admitting: Obstetrics and Gynecology

## 2020-03-14 NOTE — Patient Instructions (Signed)

## 2020-03-26 ENCOUNTER — Other Ambulatory Visit: Payer: Self-pay | Admitting: Family Medicine

## 2020-03-26 MED ORDER — LANSOPRAZOLE 30 MG PO CPDR: 30.0000 mg | DELAYED_RELEASE_CAPSULE | Freq: Every day | ORAL | 3 refills | Status: DC

## 2020-03-26 NOTE — Telephone Encounter (Signed)
I see that you refilled the prevacid. Did you speak with patient about the message below on which medication she preferred?

## 2020-03-26 NOTE — Telephone Encounter (Signed)
FYI for [employer]

## 2020-03-26 NOTE — Telephone Encounter (Signed)
Tried calling patient. Left message to call back. OK for [facility] triage to discuss with patient, then route message back to the office with patients response.

## 2020-03-26 NOTE — Telephone Encounter (Signed)
[facility] faxed refill request for the following medications:  lansoprazole (PREVACID) 30 MG capsule   Last Rx: 11/23/2019 LOV: 11/23/2019 with Dr. Brita Romp Please advise. Thanks TNP

## 2020-03-26 NOTE — Telephone Encounter (Signed)
Please advise on refill request. Prevacid was last prescribed by Dr. Jacinto Reap on 11/23/2019 for GERD. Patient was referred to GI and seen by Dr. Allen Norris on 01/09/2020. Dr. Allen Norris switched from Prevacid to Pantoprazole. Upper GI was ordered by Dr. Allen Norris showing small hiatal hernia. Per recommendations from Endoscopy note on 01/17/2020, patient was to resume present medications.

## 2020-03-26 NOTE — Telephone Encounter (Addendum)
Yes- sorry- did call patient and she is taking lansoprazole- she did not take the other on advise from GI provider. This is working well for her.

## 2020-03-26 NOTE — Telephone Encounter (Signed)
Either medication is fine. Please check with patient and see whether she thinks the pantoprazole, or the prevacid worked better for her.

## 2020-04-20 ENCOUNTER — Other Ambulatory Visit: Payer: Self-pay | Admitting: Oncology

## 2020-04-30 ENCOUNTER — Other Ambulatory Visit: Payer: Self-pay

## 2020-04-30 NOTE — Telephone Encounter (Signed)
Copied from [place] [phone]. Topic: General - Other >> Apr 30, 2020 10:38 AM Antonieta Iba C wrote: Reason for CRM: pt called in to request a Rx for magic mouth wash. Pt says that it is a common Rx that she have to get due to her teeth. Pt says that she has now ran out and would like to have a new Rx sent to :   Pharmacy: [facility] [place], [place] - [address] AT [address]  Phone:  [phone] Fax:  [phone]

## 2020-04-30 NOTE — Telephone Encounter (Signed)
Cannot be e-prescribed, please call in. Thanks.

## 2020-05-01 MED ORDER — MAGIC MOUTHWASH: ORAL | 3 refills | Status: DC

## 2020-05-01 NOTE — Telephone Encounter (Signed)
Done. Prescription called into pharmacy.  

## 2020-05-02 ENCOUNTER — Telehealth (INDEPENDENT_AMBULATORY_CARE_PROVIDER_SITE_OTHER): Payer: Medicare Other | Admitting: Family Medicine

## 2020-05-02 DIAGNOSIS — R110 Nausea: Secondary | ICD-10-CM

## 2020-05-02 DIAGNOSIS — J0110 Acute frontal sinusitis, unspecified: Secondary | ICD-10-CM | POA: Diagnosis not present

## 2020-05-02 DIAGNOSIS — J309 Allergic rhinitis, unspecified: Secondary | ICD-10-CM | POA: Diagnosis not present

## 2020-05-02 DIAGNOSIS — K760 Fatty (change of) liver, not elsewhere classified: Secondary | ICD-10-CM | POA: Diagnosis not present

## 2020-05-02 MED ORDER — AZITHROMYCIN 250 MG PO TABS: ORAL_TABLET | ORAL | 0 refills | Status: AC

## 2020-05-02 NOTE — Progress Notes (Signed)
MyChart Video Visit    Virtual Visit via Video Note   This visit type was conducted due to national recommendations for restrictions regarding the COVID-19 Pandemic (e.g. social distancing) in an effort to limit this patient's exposure and mitigate transmission in our community. This patient is at least at moderate risk for complications without adequate follow up. This format is felt to be most appropriate for this patient at this time. Physical exam was limited by quality of the video and audio technology used for the visit.   Patient location: home Provider location: bfp   Patient: Kaylee Reyes   DOB: December 01, 1951   69 y.o. Female  MRN: [id number] Visit Date: 05/02/2020  Today's healthcare provider: Lelon Huh, MD   Chief Complaint  Patient presents with  . URI   Subjective    URI  This is a new problem. Episode onset: a couple days ago. The problem has been waxing and waning. Maximum temperature: 99.1. The fever has been present for less than 1 day. Associated symptoms include congestion, ear pain, headaches and sinus pain. Sore throat: scratchy throat. Associated symptoms comments: Fatigue . She has tried increased fluids (throat drops) for the symptoms. The treatment provided no relief.  Has started having some green nasal and sinus discharge today. Started having some chest congestion today, but no significant cough. Has tried gargling salt water. Has also had some episodes of nausea which she thinks was related to consuming dairy.   She is also curious about any supplements or dietary modifications she should follow regarding fatty liver.    Medications: Outpatient Medications Prior to Visit  Medication Sig  . albuterol (VENTOLIN HFA) 108 (90 Base) MCG/ACT inhaler INHALE 2 PUFFS BY MOUTH EVERY 6 HOURS AS NEEDED WHEEZING OR SHORTNESS OF BREATH  . atenolol (TENORMIN) 50 MG tablet TAKE 1 TABLET DAILY  . B Complex-C (SUPER B COMPLEX PO) Take 1 tablet by mouth daily.   Marland Kitchen CALCIUM CITRATE PO Take 1 tablet by mouth daily.  . Cholecalciferol 50 MCG (2000 UT) TABS Take by mouth.  . docusate sodium (COLACE) 100 MG capsule Take 200 mg by mouth daily.   Marland Kitchen ELDERBERRY PO Take by mouth.  . fexofenadine (ALLEGRA) 180 MG tablet Take 180 mg by mouth daily.  . hydrochlorothiazide (HYDRODIURIL) 25 MG tablet TAKE 1 TABLET DAILY  . ibuprofen (ADVIL,MOTRIN) 200 MG tablet Take 400 mg by mouth every 6 (six) hours as needed for mild pain.  Marland Kitchen ketoconazole (NIZORAL) 2 % cream Apply 1 application topically daily.  . lansoprazole (PREVACID) 30 MG capsule Take 1 capsule (30 mg total) by mouth daily at 12 noon.  Marland Kitchen letrozole (FEMARA) 2.5 MG tablet TAKE 1 TABLET DAILY (ACCORD BRAND ONLY)  . levothyroxine (SYNTHROID) 75 MCG tablet TAKE 1 TABLET DAILY  . Loratadine (CLARITIN) 10 MG CAPS Take by mouth.  . magic mouthwash SOLN 1 part 2% viscous lidocaine: 1 part Maalox: 1 part diphenhydramine 12.5 mg per 5 ml elixir. Rinse and spit 5 ml every six hour as needed  . Magnesium 400 MG TABS Take 600 mg by mouth daily.   . meclizine (ANTIVERT) 25 MG tablet TAKE 1 TABLET BY MOUTH EVERY 4 TO 6 HOURS AS NEEDED FOR NAUSEA  . Misc Natural Products (TURMERIC CURCUMIN) CAPS Take 1 capsule by mouth daily.  Marland Kitchen nystatin (NYSTATIN) powder Apply topically 4 (four) times daily.  . Olopatadine HCl (PATADAY OP) Apply to eye.  . pantoprazole (PROTONIX) 40 MG tablet Take 40 mg by mouth  daily.  . triamcinolone cream (KENALOG) 0.5 % Apply 1 application topically 2 (two) times daily as needed. For itching  . Vitamin D, Ergocalciferol, (DRISDOL) 1.25 MG (50000 UNIT) CAPS capsule TAKE 1 CAPSULE BY MOUTH 1 TIME A WEEK   Facility-Administered Medications Prior to Visit  Medication Dose Route Frequency Provider  . sodium chloride flush (NS) 0.9 % injection 10 mL  10 mL Intravenous PRN Lloyd Huger, MD  . sodium chloride flush (NS) 0.9 % injection 10 mL  10 mL Intravenous PRN Grayland Ormond Kathlene November, MD    Review  of Systems  Constitutional: Positive for fatigue.  HENT: Positive for congestion, ear pain, sinus pressure and sinus pain. Sore throat: scratchy throat.   Respiratory: Negative.   Cardiovascular: Negative.   Musculoskeletal: Negative.   Neurological: Positive for headaches.     Objective    There were no vitals taken for this visit.   Physical Exam   Awake, alert, oriented x 3. In no apparent distress Appears well.    Assessment & Plan     1. Acute frontal sinusitis, recurrence not specified  - azithromycin (ZITHROMAX) 250 MG tablet; 2 by mouth today, then 1 daily for 4 days  Dispense: 6 tablet; Refill: 0  2. Allergic rhinitis Countinue Allegra and recommended nasal saline rinses or OTC allergy nasal sprays.   3. Nausea Seems to be related to dairy intake, likely related to fats. Will avoid dairy products and let me know if sx persist  4. Fatty infiltration of liver Counseled that this is primary related to metabolism of carbohydrates and to strictly avoid simple sugars and starches.     No follow-ups on file.     I discussed the assessment and treatment plan with the patient. The patient was provided an opportunity to ask questions and all were answered. The patient agreed with the plan and demonstrated an understanding of the instructions.   The patient was advised to call back or seek an in-person evaluation if the symptoms worsen or if the condition fails to improve as anticipated.  I provided 12 minutes of non-face-to-face time during this encounter.  The entirety of the information documented in the History of Present Illness, Review of Systems and Physical Exam were personally obtained by me. Portions of this information were initially documented by the CMA and reviewed by me for thoroughness and accuracy.     Lelon Huh, MD [facility] [phone] (phone) [phone] (fax)  [place]

## 2020-06-18 ENCOUNTER — Ambulatory Visit: Payer: Self-pay | Admitting: *Deleted

## 2020-06-18 NOTE — Telephone Encounter (Signed)
PEC scheduled appointment with Adriana tomorrow 06/19/2020.

## 2020-06-18 NOTE — Telephone Encounter (Signed)
C/o headache and pressure behind left eye since 06/10/20 after receiving cortisone shot in knee. Reports falling out of chair Saturday and hitting back of head with "knot noted to back of head". EMS called and patient did not require ED. Headache reported as mild with nausea reported and relieved with taking meclizine per patient. C/o dry eyes causing mild visual changes prior to fall. appt scheduled tomorrow. Care advise given and patient verbalized understanding of care advise. patient verbalized understanding to seek help at urgent care of ED if symptoms worsen.   Reason for Disposition . [1] MILD-MODERATE headache AND [2] present > 72 hours  Answer Assessment - Initial Assessment Questions 1. LOCATION: "Where does it hurt?"      Pressure behind left eye with mild headache 2. ONSET: "When did the headache start?" (Minutes, hours or days)     06/10/20 after receiving cortisone shot in knee 3. PATTERN: "Does the pain come and go, or has it been constant since it started?"     Comes and goes. With nausea 4. SEVERITY: "How bad is the pain?" and "What does it keep you from doing?"  (e.g., Scale 1-10; mild, moderate, or severe)   - MILD (1-3): doesn't interfere with normal activities    - MODERATE (4-7): interferes with normal activities or awakens from sleep    - SEVERE (8-10): excruciating pain, unable to do any normal activities        mild 5. RECURRENT SYMPTOM: "Have you ever had headaches before?" If Yes, ask: "When was the last time?" and "What happened that time?"      No  6. CAUSE: "What do you think is causing the headache?"     Cortisone shot 7. MIGRAINE: "Have you been diagnosed with migraine headaches?" If Yes, ask: "Is this headache similar?"      No  8. HEAD INJURY: "Has there been any recent injury to the head?"      Fell back in chair and hit back of head on Saturday examined by EMS. "knot of back of head" no soreness now .  9. OTHER SYMPTOMS: "Do you have any other symptoms?"  (fever, stiff neck, eye pain, sore throat, cold symptoms)     No. 10. PREGNANCY: "Is there any chance you are pregnant?" "When was your last menstrual period?"       n/a  Protocols used: HEADACHE-A-AH

## 2020-06-19 ENCOUNTER — Ambulatory Visit: Payer: Medicare Other | Admitting: Physician Assistant

## 2020-06-19 DIAGNOSIS — R519 Headache, unspecified: Secondary | ICD-10-CM | POA: Diagnosis not present

## 2020-06-19 DIAGNOSIS — H16223 Keratoconjunctivitis sicca, not specified as Sjogren's, bilateral: Secondary | ICD-10-CM | POA: Diagnosis not present

## 2020-06-19 DIAGNOSIS — H2513 Age-related nuclear cataract, bilateral: Secondary | ICD-10-CM | POA: Diagnosis not present

## 2020-06-23 ENCOUNTER — Other Ambulatory Visit: Payer: Self-pay | Admitting: Family Medicine

## 2020-06-23 MED ORDER — LANSOPRAZOLE 30 MG PO CPDR: 30.0000 mg | DELAYED_RELEASE_CAPSULE | Freq: Every day | ORAL | 1 refills | Status: DC

## 2020-06-23 NOTE — Telephone Encounter (Signed)
[facility] faxed refill request for the following medications:  lansoprazole (PREVACID) 30 MG capsule   90 day supply   Please advise.

## 2020-06-25 ENCOUNTER — Other Ambulatory Visit: Payer: Self-pay | Admitting: Family Medicine

## 2020-06-25 MED ORDER — LEVOTHYROXINE SODIUM 75 MCG PO TABS: 75.0000 ug | ORAL_TABLET | Freq: Every day | ORAL | 0 refills | Status: DC

## 2020-06-25 NOTE — Telephone Encounter (Signed)
RX REFILL atenolol (TENORMIN) 50 MG tablet [[id number]]  lansoprazole (PREVACID) 30 MG capsule [[id number]]  hydrochlorothiazide (HYDRODIURIL) 25 MG tablet [[id number]]  letrozole (FEMARA) 2.5 MG tablet  [facility]  Phone number [phone] Pharmacy # [id number]

## 2020-06-25 NOTE — Telephone Encounter (Signed)
Medication Refill - Medication: levothyroxine   Has the patient contacted their pharmacy? Yes.   Pt states that pharmacy will be sending over a fax for all of her medications. Please advise. (Agent: If no, request that the patient contact the pharmacy for the refill.) (Agent: If yes, when and what did the pharmacy advise?)  Preferred Pharmacy (with phone number or street name):  [place], [address] [place]  [place] [address]  Phone: [phone] Fax: [phone]  Hours: Not open 24 hours     Agent: Please be advised that RX refills may take up to 3 business days. We ask that you follow-up with your pharmacy.

## 2020-06-26 ENCOUNTER — Other Ambulatory Visit: Payer: Self-pay | Admitting: Family Medicine

## 2020-06-26 MED ORDER — LANSOPRAZOLE 30 MG PO CPDR: 30.0000 mg | DELAYED_RELEASE_CAPSULE | Freq: Every day | ORAL | 0 refills | Status: DC

## 2020-06-26 MED ORDER — HYDROCHLOROTHIAZIDE 25 MG PO TABS: 25.0000 mg | ORAL_TABLET | Freq: Every day | ORAL | 0 refills | Status: DC

## 2020-06-26 MED ORDER — ATENOLOL 50 MG PO TABS: 50.0000 mg | ORAL_TABLET | Freq: Every day | ORAL | 0 refills | Status: DC

## 2020-06-26 MED ORDER — LETROZOLE 2.5 MG PO TABS: ORAL_TABLET | ORAL | 3 refills | Status: DC

## 2020-06-26 NOTE — Telephone Encounter (Signed)
Patient advised that she is over due for a follow up appointment. Patient scheduled follow up appointment for 07/29/2020 at 10:40am, but she say she knows her cholesterol is not good because she has not been doing what she should. I gave a courtesy refill of 3 of the medications requested. Please review the refill request for Letrozole (Femara).

## 2020-06-26 NOTE — Telephone Encounter (Signed)
[facility] faxed refill request for the following medications:  letrozole (FEMARA) 2.5 MG tablet  Please advise.  Thanks, [employer]

## 2020-06-26 NOTE — Telephone Encounter (Signed)
Request has already been sent to provider for approval in a separate message.

## 2020-07-04 ENCOUNTER — Other Ambulatory Visit: Payer: Self-pay | Admitting: Gastroenterology

## 2020-07-06 NOTE — Progress Notes (Signed)
[place]  Telephone:([phone]) [phone] Fax:(336) [phone]  ID: Kaylee Reyes OB: [birth year]  MR#: [id number]  UYQ#:[id number]  Patient Care Team: Birdie Sons, MD as PCP - General (Family Medicine) [name], MD as Consulting Physician (Gastroenterology) Diamond Nickel, DO as Consulting Physician (Sports Medicine) Sharlet Salina, MD as Referring Physician (Physical Medicine and Rehabilitation) Olean Ree, MD as Consulting Physician (General Surgery) Lloyd Huger, MD as Consulting Physician (Oncology) Noreene Filbert, MD as Referring Physician (Radiation Oncology) Thornton Park, MD as Referring Physician (Orthopedic Surgery)  I connected with Kaylee Reyes on 07/09/20 at  2:45 PM EDT by video enabled telemedicine visit and verified that I am speaking with the correct person using two identifiers.   I discussed the limitations, risks, security and privacy concerns of performing an evaluation and management service by telemedicine and the availability of in-person appointments. I also discussed with the patient that there may be a patient responsible charge related to this service. The patient expressed understanding and agreed to proceed.   Other persons participating in the visit and their role in the encounter: Patient, MD.  Patient's location: Home. Provider's location: Clinic.  CHIEF COMPLAINT: Pathologic stage IIB ER/PR positive invasive lobular carcinoma of the right upper breast.  INTERVAL HISTORY: Patient agreed to video enabled telemedicine visit for her routine 61-month evaluation.  She continues to feel well and remains asymptomatic.  She continues to tolerate letrozole well without significant side effects.  She has no neurologic complaints.  She denies any recent fevers or illnesses.  She denies any chest pain, shortness of breath, cough, or hemoptysis.  She has no nausea, vomiting, constipation, or diarrhea. She has no urinary  complaints.  Patient offers no specific complaints today.  REVIEW OF SYSTEMS:   Review of Systems  Constitutional: Negative.  Negative for fever, malaise/fatigue and weight loss.  Respiratory: Negative.  Negative for cough and shortness of breath.   Cardiovascular: Negative.  Negative for chest pain and leg swelling.  Gastrointestinal: Negative.  Negative for abdominal pain and constipation.  Genitourinary: Negative.  Negative for dysuria.  Musculoskeletal: Negative.  Negative for back pain and joint pain.  Skin: Negative.  Negative for rash.  Neurological: Positive for sensory change. Negative for weakness.  Psychiatric/Behavioral: Negative.  Negative for memory loss. The patient is not nervous/anxious.     As per HPI. Otherwise, a complete review of systems is negative.  PAST MEDICAL HISTORY: Past Medical History:  Diagnosis Date  . Anemia   . Cancer ([place]) 09/2015   breast cancer  . GERD (gastroesophageal reflux disease)   . History of chicken pox   . Hyperlipidemia   . Hypertension   . Hypothyroidism   . Neuropathy of foot    bilateral  . Pinched nerve   . PONV (postoperative nausea and vomiting)    exploratory surgery at [facility]  . Thyroid disease   . Tuberculosis exposure    Mother had TB.  Pt shows (+) on tests, but has never had.  . Vertigo    last episode over 1 yr ago  . Wears dentures    partial upper and lower    PAST SURGICAL HISTORY: Past Surgical History:  Procedure Laterality Date  . ABDOMINAL HYSTERECTOMY  07/13/2013   Total. with BSO for postmenopausal bleeding and fibroids with large cervical polyp. Dr. Levy Sjogren and Dr. Glennon Mac at [facility]  . ABDOMINAL ULTRASOUND  04/17/2011   Hepatomegaly with borderline slenomegaly. Suggestive of fatty liver. s/p cholecystectomy. Portions of aorta  obscured  . BREAST SURGERY Right 2011   BREAST BIOPSY  . CESAREAN SECTION  1988  . CHOLECYSTECTOMY  1990  . COLONOSCOPY WITH PROPOFOL N/A 09/01/2017   Procedure: COLONOSCOPY  WITH PROPOFOL;  Surgeon: [name], MD;  Location: [place];  Service: Gastroenterology;  Laterality: N/A;  . DIAGNOSTIC LAPAROSCOPY    . ESOPHAGOGASTRODUODENOSCOPY (EGD) WITH PROPOFOL N/A 01/17/2020   Procedure: ESOPHAGOGASTRODUODENOSCOPY (EGD) WITH PROPOFOL;  Surgeon: [name], MD;  Location: [facility] ENDOSCOPY;  Service: Endoscopy;  Laterality: N/A;  . EXPLORATORY LAPAROTOMY     Left breast  . MASTECTOMY    . MASTECTOMY W/ SENTINEL NODE BIOPSY Bilateral 10/28/2015   Procedure: MASTECTOMY WITH SENTINEL LYMPH NODE BIOPSY;  Surgeon: Hubbard Robinson, MD;  Location: [facility] ORS;  Service: General;  Laterality: Bilateral;  . PORT-A-CATH REMOVAL Left 10/11/2016   Procedure: REMOVAL PORT-A-CATH;  Surgeon: Hubbard Robinson, MD;  Location: [facility];  Service: General;  Laterality: Left;  . PORTACATH PLACEMENT Left 12/23/2015   Procedure: INSERTION PORT-A-CATH;  Surgeon: Hubbard Robinson, MD;  Location: [facility] OR[facility];  Service: General;  Laterality: Left;  . WRIST SURGERY      FAMILY HISTORY Family History  Problem Relation Age of Onset  . Hypertension Mother   . Thyroid disease Mother   . Hypertension Brother   . Diabetes Brother   . Breast cancer Neg Hx   . Ovarian cancer Neg Hx   . Colon cancer Neg Hx        ADVANCED DIRECTIVES:    HEALTH MAINTENANCE: Social History   Tobacco Use  . Smoking status: Never Smoker  . Smokeless tobacco: Never Used  Vaping Use  . Vaping Use: Never used  Substance Use Topics  . Alcohol use: No    Alcohol/week: 0.0 standard drinks  . Drug use: No     Colonoscopy:  PAP:  Bone density:  Lipid panel:  Allergies  Allergen Reactions  . Penicillins Shortness Of Breath    Has patient had a PCN reaction causing immediate rash, facial/tongue/throat swelling, SOB or lightheadedness with hypotension: Yes Has patient had a PCN reaction causing severe rash involving mucus membranes or skin necrosis: No Has patient had a PCN reaction that  required hospitalization No Has patient had a PCN reaction occurring within the last 10 years: No If all of the above answers are "NO", then may proceed with Cephalosporin use.   . Nickel Hives  . Pravastatin Sodium Other (See Comments)    Myalgia     Current Outpatient Medications  Medication Sig Dispense Refill  . albuterol (VENTOLIN HFA) 108 (90 Base) MCG/ACT inhaler INHALE 2 PUFFS BY MOUTH EVERY 6 HOURS AS NEEDED WHEEZING OR SHORTNESS OF BREATH 18 g 3  . atenolol (TENORMIN) 50 MG tablet Take 1 tablet (50 mg total) by mouth daily. 90 tablet 0  . B Complex-C (SUPER B COMPLEX PO) Take 1 tablet by mouth daily.    Marland Kitchen CALCIUM CITRATE PO Take 1 tablet by mouth daily.    . Cholecalciferol 50 MCG (2000 UT) TABS Take by mouth.    . docusate sodium (COLACE) 100 MG capsule Take 200 mg by mouth daily.     Marland Kitchen ELDERBERRY PO Take by mouth.    . fexofenadine (ALLEGRA) 180 MG tablet Take 180 mg by mouth daily.    . hydrochlorothiazide (HYDRODIURIL) 25 MG tablet Take 1 tablet (25 mg total) by mouth daily. 90 tablet 0  . ibuprofen (ADVIL,MOTRIN) 200 MG tablet Take 400 mg by mouth  every 6 (six) hours as needed for mild pain.    Marland Kitchen ketoconazole (NIZORAL) 2 % cream Apply 1 application topically daily. 50 g 3  . lansoprazole (PREVACID) 30 MG capsule Take 1 capsule (30 mg total) by mouth daily at 12 noon. 90 capsule 0  . letrozole (FEMARA) 2.5 MG tablet TAKE 1 TABLET DAILY (ACCORD BRAND ONLY) 90 tablet 3  . levothyroxine (SYNTHROID) 75 MCG tablet Take 1 tablet (75 mcg total) by mouth daily. 90 tablet 0  . Loratadine (CLARITIN) 10 MG CAPS Take by mouth.    . magic mouthwash SOLN 1 part 2% viscous lidocaine: 1 part Maalox: 1 part diphenhydramine 12.5 mg per 5 ml elixir. Rinse and spit 5 ml every six hour as needed 60 mL 3  . Magnesium 400 MG TABS Take 600 mg by mouth daily.     . meclizine (ANTIVERT) 25 MG tablet TAKE 1 TABLET BY MOUTH EVERY 4 TO 6 HOURS AS NEEDED FOR NAUSEA 30 tablet 5  . Misc Natural Products  (TURMERIC CURCUMIN) CAPS Take 1 capsule by mouth daily.    Marland Kitchen nystatin (NYSTATIN) powder Apply topically 4 (four) times daily. 15 g 3  . Olopatadine HCl (PATADAY OP) Apply to eye.    . pantoprazole (PROTONIX) 40 MG tablet TAKE 1 TABLET(40 MG) BY MOUTH DAILY 90 tablet 2  . triamcinolone cream (KENALOG) 0.5 % Apply 1 application topically 2 (two) times daily as needed. For itching 30 g 0  . Vitamin D, Ergocalciferol, (DRISDOL) 1.25 MG (50000 UNIT) CAPS capsule TAKE 1 CAPSULE BY MOUTH 1 TIME A WEEK 12 capsule 0   No current facility-administered medications for this visit.   Facility-Administered Medications Ordered in Other Visits  Medication Dose Route Frequency Provider Last Rate Last Admin  . sodium chloride flush (NS) 0.9 % injection 10 mL  10 mL Intravenous PRN Lloyd Huger, MD   10 mL at 02/09/16 0930  . sodium chloride flush (NS) 0.9 % injection 10 mL  10 mL Intravenous PRN Lloyd Huger, MD   10 mL at 06/21/16 0950    OBJECTIVE: There were no vitals filed for this visit.   There is no height or weight on file to calculate BMI.    ECOG FS:0 - Asymptomatic  General: Well-developed, well-nourished, no acute distress. HEENT: Normocephalic. Neuro: Alert, answering all questions appropriately. Cranial nerves grossly intact. Psych: Normal affect.  LAB RESULTS:  Lab Results  Component Value Date   NA 139 11/20/2019   K 4.0 11/20/2019   CL 98 11/20/2019   CO2 25 11/20/2019   GLUCOSE 104 (H) 11/20/2019   BUN 12 11/20/2019   CREATININE 0.80 11/20/2019   CALCIUM 10.2 11/20/2019   PROT 7.4 11/20/2019   ALBUMIN 4.6 11/20/2019   AST 28 11/20/2019   ALT 35 (H) 11/20/2019   ALKPHOS 103 11/20/2019   BILITOT 0.4 11/20/2019   GFRNONAA 77 11/20/2019   GFRAA 88 11/20/2019    Lab Results  Component Value Date   WBC 8.4 11/20/2019   NEUTROABS 6.4 04/04/2017   HGB 12.4 11/20/2019   HCT 36.7 11/20/2019   MCV 86 11/20/2019   PLT 201 11/20/2019   Lab Results  Component  Value Date   IRON 48 02/09/2016   TIBC 326 02/09/2016   IRONPCTSAT 15 02/09/2016     STUDIES: DG Bone Density  Result Date: 07/07/2020 EXAM: DUAL X-RAY ABSORPTIOMETRY (DXA) FOR BONE MINERAL DENSITY IMPRESSION: Your patient Chaeli Judy completed a BMD test on 07/07/2020 using the [employer]  iDXA DXA System (software version: 14.10) manufactured by [employer]. The following summarizes the results of our evaluation. Technologist: SCE PATIENT BIOGRAPHICAL: Name: Sonal, Dorwart Patient ID: [id number] Birth Date: 12-10-1950 Height: 66.0 in. Gender: Female Exam Date: 07/07/2020 Weight: 284.0 lbs. Indications: Breast CA, Caucasian, Family Hx of Osteoporosis, Height Loss, Hypothyroid, Hysterectomy, Oophorectomy Bilateral, Postmenopausal Fractures: Right wrist Treatments: Allegra, Calcium, Femara, Levothyroxine, Protonix, Vitamin D DENSITOMETRY RESULTS: Site      Region     Measured Date Measured Age WHO Classification Young Adult T-score BMD         %Change vs. Previous Significant Change (*) AP Spine L1-L3 07/07/2020 68.1 Normal 3.2 1.579 g/cm2 -1.5% - AP Spine L1-L3 07/03/2018 66.1 Normal 3.4 1.603 g/cm2 2.0% Yes AP Spine L1-L3 07/29/2016 64.1 Normal 3.2 1.572 g/cm2 - - DualFemur Neck Left 07/07/2020 68.1 Normal 0.0 1.043 g/cm2 -6.2% Yes DualFemur Neck Left 07/03/2018 66.1 Normal 0.5 1.112 g/cm2 -3.2% - DualFemur Neck Left 07/29/2016 64.1 Normal 0.8 1.149 g/cm2 - - DualFemur Total Mean 07/07/2020 68.1 Normal 1.1 1.150 g/cm2 -0.7% - DualFemur Total Mean 07/03/2018 66.1 Normal 1.2 1.158 g/cm2 -7.6% Yes DualFemur Total Mean 07/29/2016 64.1 Normal 2.0 1.253 g/cm2 - - ASSESSMENT: The BMD measured at Femur Neck Left is 1.043 g/cm2 with a T-score of 0.0. This patient is considered normal according to [facility]) criteria. The scan quality is good. Compared with prior study, there has been no significant change in the spine. Compared with prior study, there has been a significant decrease  in the total hip. L4 was excluded due to degenerative changes. World Pharmacologist [facility]) criteria for post-menopausal, Caucasian Women: Normal:                   T-score at or above -1 SD Osteopenia/low bone mass: T-score between -1 and -2.5 SD Osteoporosis:             T-score at or below -2.5 SD RECOMMENDATIONS: 1. All patients should optimize calcium and vitamin D intake. 2. Consider FDA-approved medical therapies in postmenopausal women and men aged 58 years and older, based on the following: a. A hip or vertebral(clinical or morphometric) fracture b. T-score < -2.5 at the femoral neck or spine after appropriate evaluation to exclude secondary causes c. Low bone mass (T-score between -1.0 and -2.5 at the femoral neck or spine) and a 10-year probability of a hip fracture > 3% or a 10-year probability of a major osteoporosis-related fracture > 20% based on the [place]-adapted WHO algorithm 3. Clinician judgment and/or patient preferences may indicate treatment for people with 10-year fracture probabilities above or below these levels FOLLOW-UP: People with diagnosed cases of osteoporosis or at high risk for fracture should have regular bone mineral density tests. For patients eligible for Medicare, routine testing is allowed once every 2 years. The testing frequency can be increased to one year for patients who have rapidly progressing disease, those who are receiving or discontinuing medical therapy to restore bone mass, or have additional risk factors. I have reviewed this report, and agree with the above findings. [facility] Electronically Signed   By: Rolm Baptise M.D.   On: 07/07/2020 13:22    ASSESSMENT: Pathologic stage IIB ER/PR positive invasive lobular carcinoma of the right upper breast.  PLAN:    1. Pathologic stage IIB ER/PR positive invasive lobular carcinoma of the right upper breast: Patient is now status post bilateral mastectomy, therefore does not require additional  mammograms. She completed  4 cycles of Taxotere and Cytoxan on March 03, 2016.  She also completed adjuvant XRT.  Continue letrozole for a total of 5 years completing treatment in July 2022.  Patient will have video assisted telemedicine visit in 6 months for routine evaluation.     2.  Bone health: Patient's most recent bone mineral density on July 07, 2020 reported T score of 0.0 which is considered normal.  Continue to monitor every 2 years.  Repeat in July 2023.    I provided 20 minutes of face-to-face video visit time during this encounter which included chart review, counseling, and coordination of care as documented above.   Patient expressed understanding and was in agreement with this plan. She also understands that She can call clinic at any time with any questions, concerns, or complaints.   Invasive lobular carcinoma of right breast, stage 2 ([place])   Staging form: Breast, AJCC 7th Edition     Pathologic stage: Stage IIB (T2, N1a, M0) - Signed by Lloyd Huger, MD on 10/22/2015   Lloyd Huger, MD   07/09/2020 2:01 PM

## 2020-07-07 ENCOUNTER — Ambulatory Visit
Admission: RE | Admit: 2020-07-07 | Discharge: 2020-07-07 | Disposition: A | Discharge status: Home / Self Care | Payer: Medicare Other | Source: Ambulatory Visit | Attending: Oncology | Admitting: Oncology

## 2020-07-07 ENCOUNTER — Other Ambulatory Visit: Payer: Self-pay

## 2020-07-07 DIAGNOSIS — C50911 Malignant neoplasm of unspecified site of right female breast: Secondary | ICD-10-CM | POA: Insufficient documentation

## 2020-07-07 DIAGNOSIS — Z780 Asymptomatic menopausal state: Secondary | ICD-10-CM | POA: Diagnosis not present

## 2020-07-08 ENCOUNTER — Inpatient Hospital Stay: Payer: Medicare Other | Attending: Oncology | Admitting: Oncology

## 2020-07-08 ENCOUNTER — Encounter: Payer: Self-pay | Admitting: Oncology

## 2020-07-08 DIAGNOSIS — Z79811 Long term (current) use of aromatase inhibitors: Secondary | ICD-10-CM | POA: Insufficient documentation

## 2020-07-08 DIAGNOSIS — C50911 Malignant neoplasm of unspecified site of right female breast: Secondary | ICD-10-CM

## 2020-07-08 DIAGNOSIS — Z923 Personal history of irradiation: Secondary | ICD-10-CM | POA: Insufficient documentation

## 2020-07-08 DIAGNOSIS — Z170 Estrogen receptor positive status [ER+]: Secondary | ICD-10-CM | POA: Insufficient documentation

## 2020-07-08 NOTE — Progress Notes (Signed)
Patient called today for follow up. States she is not having any pain. She does report some shortness of breath but is not sure if it could be coming from letrozole.

## 2020-07-12 ENCOUNTER — Other Ambulatory Visit: Payer: Self-pay | Admitting: Oncology

## 2020-07-14 NOTE — Progress Notes (Signed)
Established patient visit   Patient: Kaylee Reyes   DOB: October 25, 1951   69 y.o. Female  MRN: [id number] Visit Date: 07/15/2020  Today's healthcare provider: Lelon Huh, MD   Chief Complaint  Patient presents with  . Hyperlipidemia  . Hypertension  . Hypothyroidism   Subjective    HPI  Lipid/Cholesterol, Follow-up  Last lipid panel Other pertinent labs  Lab Results  Component Value Date   CHOL 299 (H) 11/20/2019   HDL 48 11/20/2019   LDLCALC 214 (H) 11/20/2019   TRIG 191 (H) 11/20/2019   CHOLHDL 6.2 (H) 11/20/2019   Lab Results  Component Value Date   ALT 35 (H) 11/20/2019   AST 28 11/20/2019   PLT 201 11/20/2019   TSH 1.840 11/20/2019     Patient was started on Rosuvastatin 5mg  daily on 11/22/2019.  She has on her own stopped taking this medication.  She was not having any adverse effects to this one but, states she had trouble with other statins and that is why she never started it.   Current diet: Keto diet Current exercise: none  The 10-year ASCVD risk score Mikey Bussing DC Jr., et al., 2013) is: 15%  ---------------------------------------------------------------------------------------------------  Hypothyroid, follow-up  Lab Results  Component Value Date   TSH 1.840 11/20/2019   TSH 2.200 04/04/2017   TSH 2.020 05/26/2016   T4TOTAL 7.4 04/04/2017   Wt Readings from Last 3 Encounters:  07/15/20 284 lb (128.8 kg)  03/13/20 282 lb 11.2 oz (128.2 kg)  01/17/20 280 lb (127 kg)   Symptoms: No change in energy level No constipation  No diarrhea No heat / cold intolerance  No nervousness No palpitations  No weight changes    -----------------------------------------------------------------------------------------  Hypertension, follow-up  BP Readings from Last 3 Encounters:  07/15/20 (!) 142/88  03/13/20 (!) 173/106  01/17/20 (!) 156/77   Wt Readings from Last 3 Encounters:  07/15/20 284 lb (128.8 kg)  03/13/20 282 lb 11.2 oz (128.2 kg)   01/17/20 280 lb (127 kg)     She was last seen for hypertension 1 years ago.  BP at that visit was 128/76. Management since that visit includes continue same medication.  She reports excellent compliance with treatment. She is not having side effects.    Use of agents associated with hypertension: none.   Outside blood pressures are 140's/70's . Pertinent labs: Lab Results  Component Value Date   CHOL 299 (H) 11/20/2019   HDL 48 11/20/2019   LDLCALC 214 (H) 11/20/2019   TRIG 191 (H) 11/20/2019   CHOLHDL 6.2 (H) 11/20/2019   Lab Results  Component Value Date   NA 139 11/20/2019   K 4.0 11/20/2019   CREATININE 0.80 11/20/2019   GFRNONAA 77 11/20/2019   GFRAA 88 11/20/2019   GLUCOSE 104 (H) 11/20/2019     The 10-year ASCVD risk score Mikey Bussing DC Jr., et al., 2013) is: 15%   ---------------------------------------------------------------------------------------------------     Medications: Outpatient Medications Prior to Visit  Medication Sig  . albuterol (VENTOLIN HFA) 108 (90 Base) MCG/ACT inhaler INHALE 2 PUFFS BY MOUTH EVERY 6 HOURS AS NEEDED WHEEZING OR SHORTNESS OF BREATH  . atenolol (TENORMIN) 50 MG tablet Take 1 tablet (50 mg total) by mouth daily.  . B Complex-C (SUPER B COMPLEX PO) Take 1 tablet by mouth daily.  Marland Kitchen CALCIUM CITRATE PO Take 1 tablet by mouth daily.  Marland Kitchen docusate sodium (COLACE) 100 MG capsule Take 200 mg by mouth daily.   Marland Kitchen ELDERBERRY  PO Take by mouth.  . fexofenadine (ALLEGRA) 180 MG tablet Take 180 mg by mouth daily.  . hydrochlorothiazide (HYDRODIURIL) 25 MG tablet Take 1 tablet (25 mg total) by mouth daily.  Marland Kitchen ibuprofen (ADVIL,MOTRIN) 200 MG tablet Take 400 mg by mouth every 6 (six) hours as needed for mild pain.  Marland Kitchen ketoconazole (NIZORAL) 2 % cream Apply 1 application topically daily.  . lansoprazole (PREVACID) 30 MG capsule Take 1 capsule (30 mg total) by mouth daily at 12 noon.  Marland Kitchen letrozole (FEMARA) 2.5 MG tablet TAKE 1 TABLET DAILY (ACCORD  BRAND ONLY)  . levothyroxine (SYNTHROID) 75 MCG tablet Take 1 tablet (75 mcg total) by mouth daily.  . magic mouthwash SOLN 1 part 2% viscous lidocaine: 1 part Maalox: 1 part diphenhydramine 12.5 mg per 5 ml elixir. Rinse and spit 5 ml every six hour as needed  . Magnesium 400 MG TABS Take 600 mg by mouth daily.   . meclizine (ANTIVERT) 25 MG tablet TAKE 1 TABLET BY MOUTH EVERY 4 TO 6 HOURS AS NEEDED FOR NAUSEA  . Misc Natural Products (TURMERIC CURCUMIN) CAPS Take 1 capsule by mouth daily.  Marland Kitchen nystatin (NYSTATIN) powder Apply topically 4 (four) times daily.  . Olopatadine HCl (PATADAY OP) Apply to eye.  . triamcinolone cream (KENALOG) 0.5 % Apply 1 application topically 2 (two) times daily as needed. For itching  . Vitamin D, Ergocalciferol, (DRISDOL) 1.25 MG (50000 UNIT) CAPS capsule TAKE 1 CAPSULE BY MOUTH 1 TIME A WEEK  . Cholecalciferol 50 MCG (2000 UT) TABS Take by mouth.  . Loratadine (CLARITIN) 10 MG CAPS Take by mouth.  . pantoprazole (PROTONIX) 40 MG tablet TAKE 1 TABLET(40 MG) BY MOUTH DAILY   Facility-Administered Medications Prior to Visit  Medication Dose Route Frequency Provider  . sodium chloride flush (NS) 0.9 % injection 10 mL  10 mL Intravenous PRN Lloyd Huger, MD  . sodium chloride flush (NS) 0.9 % injection 10 mL  10 mL Intravenous PRN Grayland Ormond Kathlene November, MD    Review of Systems  Constitutional: Negative for fatigue and fever.  Respiratory: Negative for cough, shortness of breath and wheezing.   Cardiovascular: Negative for chest pain, palpitations and leg swelling.  Neurological: Negative for dizziness and headaches.     Objective    BP (!) 142/88 (BP Location: Left Arm, Patient Position: Sitting, Cuff Size: Normal)   Pulse 82   Temp 98.6 F (37 C) (Oral)   Wt 284 lb (128.8 kg)   SpO2 97%   BMI 45.84 kg/m   Physical Exam   General: Appearance:    Severely obese female in no acute distress  Eyes:    PERRL, conjunctiva/corneas clear, EOM's intact        Lungs:     Clear to auscultation bilaterally, respirations unlabored  Heart:    Normal heart rate. Normal rhythm. No murmurs, rubs, or gallops.   MS:   All extremities are intact.   Neurologic:   Awake, alert, oriented x 3. No apparent focal neurological           defect.          Assessment & Plan     1. Essential hypertension SBP up in office today, but she reports home blood pressures are usually better. Continue current medications.  Work on diet and exercise.  - CBC with Differential/Platelet  2. Hypothyroidism, unspecified type Doing well current thyroid replacement.  - TSH  3. Hypercholesterolemia Diet controlled.  - Comprehensive metabolic panel -  Lipid Panel With LDL/HDL Ratio   Counseled on risks and benefits of Covid-19 vaccination and advised it is my professional opinion that the vaccine would be of great medical benefit to her.    Addressed multiple chronic medical problems and extensively counseled regarding Covid 19 vaccine,  requiring 25 minutes reviewing her medical record, counseling patient regarding her conditions and coordination of care.        The entirety of the information documented in the History of Present Illness, Review of Systems and Physical Exam were personally obtained by me. Portions of this information were initially documented by the CMA and reviewed by me for thoroughness and accuracy.      Lelon Huh, MD  [facility] [phone] (phone) [phone] (fax)  [place]

## 2020-07-15 ENCOUNTER — Other Ambulatory Visit: Payer: Self-pay

## 2020-07-15 ENCOUNTER — Ambulatory Visit (INDEPENDENT_AMBULATORY_CARE_PROVIDER_SITE_OTHER): Payer: Medicare Other | Admitting: Family Medicine

## 2020-07-15 ENCOUNTER — Other Ambulatory Visit: Payer: Self-pay | Admitting: *Deleted

## 2020-07-15 VITALS — BP 142/88 | HR 82 | Temp 98.6°F | Wt 284.0 lb

## 2020-07-15 DIAGNOSIS — I10 Essential (primary) hypertension: Secondary | ICD-10-CM

## 2020-07-15 DIAGNOSIS — E039 Hypothyroidism, unspecified: Secondary | ICD-10-CM | POA: Diagnosis not present

## 2020-07-15 DIAGNOSIS — E7800 Pure hypercholesterolemia, unspecified: Secondary | ICD-10-CM | POA: Diagnosis not present

## 2020-07-15 NOTE — Patient Instructions (Addendum)
. Covid-19 vaccines: The Covid vaccines have been given to hundreds of millions of people and found to be very effective and are as safe as any other vaccine.  The [employer] vaccine has been associated with very rare dangerous blood clots, but only in adult women under the age of 80.  The risk of dying from Covid infections is much higher than having a serious reaction to the vaccine.  I strongly recommend getting fully vaccinated against Covid-19.  I recommend that adult women under 60 get fully vaccinated, but the [place] vaccines may be safer for those women than the [employer] vaccine.       Hiatal Hernia  A hiatal hernia occurs when part of the stomach slides above the muscle that separates the abdomen from the chest (diaphragm). A person can be born with a hiatal hernia (congenital), or it may develop over time. In almost all cases of hiatal hernia, only the top part of the stomach pushes through the diaphragm. Many people have a hiatal hernia with no symptoms. The larger the hernia, the more likely it is that you will have symptoms. In some cases, a hiatal hernia allows stomach acid to flow back into the tube that carries food from your mouth to your stomach (esophagus). This may cause heartburn symptoms. Severe heartburn symptoms may mean that you have developed a condition called gastroesophageal reflux disease (GERD). What are the causes? This condition is caused by a weakness in the opening (hiatus) where the esophagus passes through the diaphragm to attach to the upper part of the stomach. A person may be born with a weakness in the hiatus, or a weakness can develop over time. What increases the risk? This condition is more likely to develop in:  Older people. Age is a major risk factor for a hiatal hernia, especially if you are over the age of 25.  Pregnant women.  People who are overweight.  People who have frequent constipation. What are the signs  or symptoms? Symptoms of this condition usually develop in the form of GERD symptoms. Symptoms include:  Heartburn.  Belching.  Indigestion.  Trouble swallowing.  Coughing or wheezing.  Sore throat.  Hoarseness.  Chest pain.  Nausea and vomiting. How is this diagnosed? This condition may be diagnosed during testing for GERD. Tests that may be done include:  X-rays of your stomach or chest.  An upper gastrointestinal (GI) series. This is an X-ray exam of your GI tract that is taken after you swallow a chalky liquid that shows up clearly on the X-ray.  Endoscopy. This is a procedure to look into your stomach using a thin, flexible tube that has a tiny camera and light on the end of it. How is this treated? This condition may be treated by:  Dietary and lifestyle changes to help reduce GERD symptoms.  Medicines. These may include: ? Over-the-counter antacids. ? Medicines that make your stomach empty more quickly. ? Medicines that block the production of stomach acid (H2 blockers). ? Stronger medicines to reduce stomach acid (proton pump inhibitors).  Surgery to repair the hernia, if other treatments are not helping. If you have no symptoms, you may not need treatment. Follow these instructions at home: Lifestyle and activity  Do not use any products that contain nicotine or tobacco, such as cigarettes and e-cigarettes. If you need help quitting, ask your health care provider.  Try to achieve and maintain a healthy body weight.  Avoid putting pressure  on your abdomen. Anything that puts pressure on your abdomen increases the amount of acid that may be pushed up into your esophagus. ? Avoid bending over, especially after eating. ? Raise the head of your bed by putting blocks under the legs. This keeps your head and esophagus higher than your stomach. ? Do not wear tight clothing around your chest or stomach. ? Try not to strain when having a bowel movement, when  urinating, or when lifting heavy objects. Eating and drinking  Avoid foods that can worsen GERD symptoms. These may include: ? Fatty foods, like fried foods. ? Citrus fruits, like oranges or lemon. ? Other foods and drinks that contain acid, like orange juice or tomatoes. ? Spicy food. ? Chocolate.  Eat frequent small meals instead of three large meals a day. This helps prevent your stomach from getting too full. ? Eat slowly. ? Do not lie down right after eating. ? Do not eat 1-2 hours before bed.  Do not drink beverages with caffeine. These include cola, coffee, cocoa, and tea.  Do not drink alcohol. General instructions  Take over-the-counter and prescription medicines only as told by your health care provider.  Keep all follow-up visits as told by your health care provider. This is important. Contact a health care provider if:  Your symptoms are not controlled with medicines or lifestyle changes.  You are having trouble swallowing.  You have coughing or wheezing that will not go away. Get help right away if:  Your pain is getting worse.  Your pain spreads to your arms, neck, jaw, teeth, or back.  You have shortness of breath.  You sweat for no reason.  You feel sick to your stomach (nauseous) or you vomit.  You vomit blood.  You have bright red blood in your stools.  You have black, tarry stools. This information is not intended to replace advice given to you by your health care provider. Make sure you discuss any questions you have with your health care provider. Document Revised: 11/04/2017 Document Reviewed: 06/27/2017 Elsevier Patient Education  [place].

## 2020-07-16 ENCOUNTER — Telehealth: Payer: Self-pay

## 2020-07-16 ENCOUNTER — Encounter: Payer: Self-pay | Admitting: Family Medicine

## 2020-07-16 LAB — COMPREHENSIVE METABOLIC PANEL
ALT: 52 IU/L — ABNORMAL HIGH (ref 0–32)
AST: 42 IU/L — ABNORMAL HIGH (ref 0–40)
Albumin/Globulin Ratio: 1.4 (ref 1.2–2.2)
Albumin: 4.5 g/dL (ref 3.8–4.8)
Alkaline Phosphatase: 114 IU/L (ref 48–121)
BUN/Creatinine Ratio: 11 — ABNORMAL LOW (ref 12–28)
BUN: 11 mg/dL (ref 8–27)
Bilirubin Total: 0.5 mg/dL (ref 0.0–1.2)
CO2: 24 mmol/L (ref 20–29)
Calcium: 9.9 mg/dL (ref 8.7–10.3)
Chloride: 100 mmol/L (ref 96–106)
Creatinine, Ser: 1.04 mg/dL — ABNORMAL HIGH (ref 0.57–1.00)
GFR calc Af Amer: 64 mL/min/{1.73_m2} (ref >59)
GFR calc non Af Amer: 55 mL/min/{1.73_m2} — ABNORMAL LOW (ref >59)
Globulin, Total: 3.2 g/dL (ref 1.5–4.5)
Glucose: 107 mg/dL — ABNORMAL HIGH (ref 65–99)
Potassium: 4 mmol/L (ref 3.5–5.2)
Sodium: 141 mmol/L (ref 134–144)
Total Protein: 7.7 g/dL (ref 6.0–8.5)

## 2020-07-16 LAB — CBC WITH DIFFERENTIAL/PLATELET
Basophils Absolute: 0 10*3/uL (ref 0.0–0.2)
Basos: 0 %
EOS (ABSOLUTE): 0.2 10*3/uL (ref 0.0–0.4)
Eos: 2 %
Hematocrit: 36 % (ref 34.0–46.6)
Hemoglobin: 12 g/dL (ref 11.1–15.9)
Immature Grans (Abs): 0 10*3/uL (ref 0.0–0.1)
Immature Granulocytes: 0 %
Lymphocytes Absolute: 1.2 10*3/uL (ref 0.7–3.1)
Lymphs: 18 %
MCH: 29.2 pg (ref 26.6–33.0)
MCHC: 33.3 g/dL (ref 31.5–35.7)
MCV: 88 fL (ref 79–97)
Monocytes Absolute: 0.4 10*3/uL (ref 0.1–0.9)
Monocytes: 6 %
Neutrophils Absolute: 4.9 10*3/uL (ref 1.4–7.0)
Neutrophils: 74 %
Platelets: 207 10*3/uL (ref 150–450)
RBC: 4.11 x10E6/uL (ref 3.77–5.28)
RDW: 13.2 % (ref 11.7–15.4)
WBC: 6.7 10*3/uL (ref 3.4–10.8)

## 2020-07-16 LAB — LIPID PANEL WITH LDL/HDL RATIO
Cholesterol, Total: 247 mg/dL — ABNORMAL HIGH (ref 100–199)
HDL: 39 mg/dL — ABNORMAL LOW (ref >39)
LDL Chol Calc (NIH): 179 mg/dL — ABNORMAL HIGH (ref 0–99)
LDL/HDL Ratio: 4.6 ratio — ABNORMAL HIGH (ref 0.0–3.2)
Triglycerides: 154 mg/dL — ABNORMAL HIGH (ref 0–149)
VLDL Cholesterol Cal: 29 mg/dL (ref 5–40)

## 2020-07-16 LAB — TSH: TSH: 0.925 u[IU]/mL (ref 0.450–4.500)

## 2020-07-16 MED ORDER — EZETIMIBE 10 MG PO TABS: 10.0000 mg | ORAL_TABLET | Freq: Every day | ORAL | 3 refills | Status: DC

## 2020-07-16 NOTE — Telephone Encounter (Signed)
-----   Message from Birdie Sons, MD sent at 07/16/2020  1:33 PM EDT ----- Cholesterol has improved from 299 to 247, but is still much too high.recommend taking ezetimibe 10mg  once a day, #30, rf x 3. It doesn't work as well as a statin, but much less likely to have any side effects.  Follow up for lipids in 4 months.

## 2020-07-16 NOTE — Telephone Encounter (Signed)
Patient advised. RX sent to pharmacy. Follow up scheduled.

## 2020-07-21 ENCOUNTER — Other Ambulatory Visit: Payer: Self-pay | Admitting: Oncology

## 2020-07-28 NOTE — Progress Notes (Signed)
Subjective:   Kaylee Reyes is a 69 y.o. female who presents for Medicare Annual (Subsequent) preventive examination.    I connected with Tomi Grandpre today by telephone and verified that I am speaking with the correct person using two identifiers. Location patient: home Location provider: work Persons participating in the virtual visit: patient, provider.   I discussed the limitations, risks, security and privacy concerns of performing an evaluation and management service by telephone and the availability of in person appointments. I also discussed with the patient that there may be a patient responsible charge related to this service. The patient expressed understanding and verbally consented to this telephonic visit.    Interactive audio and video telecommunications were attempted between this provider and patient, however failed, due to patient having technical difficulties OR patient did not have access to video capability.  We continued and completed visit with audio only.   Review of Systems    N/A  Cardiac Risk Factors include: advanced age (>26men, >22 women);dyslipidemia;hypertension;obesity (BMI >30kg/m2)     Objective:    There were no vitals filed for this visit. There is no height or weight on file to calculate BMI.  Advanced Directives 07/29/2020 01/31/2020 01/17/2020 08/22/2019 07/25/2019 09/20/2018 07/10/2018  Does Patient Have a Medical Advance Directive? Yes Yes Yes No Yes Yes Yes  Type of Paramedic of [place];Living will [place];Living will Living will;Healthcare Power of [place];Living will [place];Living will [place];Living will  Does patient want to make changes to medical advance directive? - - - No - Patient declined - - No - Patient declined  Copy of [place] in Chart? No - copy requested - No - copy requested - No - copy  requested No - copy requested No - copy requested    Current Medications (verified) Outpatient Encounter Medications as of 07/29/2020  Medication Sig  . albuterol (VENTOLIN HFA) 108 (90 Base) MCG/ACT inhaler INHALE 2 PUFFS BY MOUTH EVERY 6 HOURS AS NEEDED WHEEZING OR SHORTNESS OF BREATH  . atenolol (TENORMIN) 50 MG tablet Take 1 tablet (50 mg total) by mouth daily.  . B Complex-C (SUPER B COMPLEX PO) Take 1 tablet by mouth daily.  Marland Kitchen CALCIUM CITRATE PO Take 1 tablet by mouth daily.  . Carboxymeth-Glycerin-Polysorb (REFRESH OPTIVE MEGA-3 OP) Place 1 drop into both eyes daily.  Marland Kitchen docusate sodium (COLACE) 100 MG capsule Take 200 mg by mouth daily.   Marland Kitchen ELDERBERRY PO Take by mouth daily.   . fexofenadine (ALLEGRA) 180 MG tablet Take 180 mg by mouth daily.  . hydrochlorothiazide (HYDRODIURIL) 25 MG tablet Take 1 tablet (25 mg total) by mouth daily.  Marland Kitchen ketoconazole (NIZORAL) 2 % cream Apply 1 application topically daily.  . lansoprazole (PREVACID) 30 MG capsule Take 1 capsule (30 mg total) by mouth daily at 12 noon.  Marland Kitchen letrozole (FEMARA) 2.5 MG tablet TAKE 1 TABLET DAILY (ACCORD BRAND ONLY)  . levothyroxine (SYNTHROID) 75 MCG tablet Take 1 tablet (75 mcg total) by mouth daily.  . magic mouthwash SOLN 1 part 2% viscous lidocaine: 1 part Maalox: 1 part diphenhydramine 12.5 mg per 5 ml elixir. Rinse and spit 5 ml every six hour as needed  . Magnesium 400 MG TABS Take 600 mg by mouth daily.   . meclizine (ANTIVERT) 25 MG tablet TAKE 1 TABLET BY MOUTH EVERY 4 TO 6 HOURS AS NEEDED FOR NAUSEA  . Misc Natural Products (  TURMERIC CURCUMIN) CAPS Take 1 capsule by mouth daily.  Marland Kitchen nystatin (NYSTATIN) powder Apply topically 4 (four) times daily.  Marland Kitchen triamcinolone cream (KENALOG) 0.5 % Apply 1 application topically 2 (two) times daily as needed. For itching  . Vitamin D, Ergocalciferol, (DRISDOL) 1.25 MG (50000 UNIT) CAPS capsule TAKE 1 CAPSULE BY MOUTH 1 TIME A WEEK  . White Petrolatum-Mineral Oil (EYE LUBRICANT)  OINT Apply to eye. [place] Night as needed for eyes at night.  . ezetimibe (ZETIA) 10 MG tablet Take 1 tablet (10 mg total) by mouth daily. (Patient not taking: Reported on 07/29/2020)  . ibuprofen (ADVIL,MOTRIN) 200 MG tablet Take 400 mg by mouth every 6 (six) hours as needed for mild pain. (Patient not taking: Reported on 07/29/2020)  . Olopatadine HCl (PATADAY OP) Apply to eye. (Patient not taking: Reported on 07/29/2020)   No facility-administered encounter medications on file as of 07/29/2020.    Allergies (verified) Penicillins, Nickel, and Pravastatin sodium   History: Past Medical History:  Diagnosis Date  . Anemia   . Cancer ([place]) 09/2015   breast cancer  . GERD (gastroesophageal reflux disease)   . History of chicken pox   . Hyperlipidemia   . Hypertension   . Hypothyroidism   . Neuropathy of foot    bilateral  . Pinched nerve   . PONV (postoperative nausea and vomiting)    exploratory surgery at [facility]  . Thyroid disease   . Tuberculosis exposure    Mother had TB.  Pt shows (+) on tests, but has never had.  . Vertigo    last episode over 1 yr ago  . Wears dentures    partial upper and lower   Past Surgical History:  Procedure Laterality Date  . ABDOMINAL HYSTERECTOMY  07/13/2013   Total. with BSO for postmenopausal bleeding and fibroids with large cervical polyp. Dr. Levy Sjogren and Dr. Glennon Mac at [facility]  . ABDOMINAL ULTRASOUND  04/17/2011   Hepatomegaly with borderline slenomegaly. Suggestive of fatty liver. s/p cholecystectomy. Portions of aorta obscured  . BREAST SURGERY Right 2011   BREAST BIOPSY  . CESAREAN SECTION  1988  . CHOLECYSTECTOMY  1990  . COLONOSCOPY WITH PROPOFOL N/A 09/01/2017   Procedure: COLONOSCOPY WITH PROPOFOL;  Surgeon: [name], MD;  Location: [place];  Service: Gastroenterology;  Laterality: N/A;  . DIAGNOSTIC LAPAROSCOPY    . ESOPHAGOGASTRODUODENOSCOPY (EGD) WITH PROPOFOL N/A 01/17/2020   Procedure: ESOPHAGOGASTRODUODENOSCOPY (EGD)  WITH PROPOFOL;  Surgeon: [name], MD;  Location[facility] ENDOSCOPY;  Service: Endoscopy;  Laterality: N/A;  . EXPLORATORY LAPAROTOMY     Left breast  . MASTECTOMY    . MASTECTOMY W/ SENTINEL NODE BIOPSY Bilateral 10/28/2015   Procedure: MASTECTOMY WITH SENTINEL LYMPH NODE BIOPSY;  Surgeon: Hubbard Robinson, MD;  Location: [facility] ORS;  Service: General;  Laterality: Bilateral;  . PORT-A-CATH REMOVAL Left 10/11/2016   Procedure: REMOVAL PORT-A-CATH;  Surgeon: Hubbard Robinson, MD;  Location: [facility];  Service: General;  Laterality: Left;  . PORTACATH PLACEMENT Left 12/23/2015   Procedure: INSERTION PORT-A-CATH;  Surgeon: Hubbard Robinson, MD;  Location: [facility] ORS;  Service: General;  Laterality: Left;  . WRIST SURGERY     Family History  Problem Relation Age of Onset  . Hypertension Mother   . Thyroid disease Mother   . Hypertension Brother   . Diabetes Brother   . Breast cancer Neg Hx   . Ovarian cancer Neg Hx   . Colon cancer Neg Hx    Social History  Socioeconomic History  . Marital status: Married    Spouse name: Not on file  . Number of children: 2  . Years of education: Not on file  . Highest education level: Some college, no degree  Occupational History  . Occupation: retired  Tobacco Use  . Smoking status: Never Smoker  . Smokeless tobacco: Never Used  Vaping Use  . Vaping Use: Never used  Substance and Sexual Activity  . Alcohol use: No    Alcohol/week: 0.0 standard drinks  . Drug use: No  . Sexual activity: Never    Birth control/protection: Surgical  Other Topics Concern  . Not on file  Social History Narrative  . Not on file   Social Determinants of Health   Financial Resource Strain: Low Risk   . Difficulty of Paying Living Expenses: Not hard at all  Food Insecurity: No Food Insecurity  . Worried About Charity fundraiser in the Last Year: Never true  . Ran Out of Food in the Last Year: Never true  Transportation Needs: No Transportation Needs    . Lack of Transportation (Medical): No  . Lack of Transportation (Non-Medical): No  Physical Activity: Inactive  . Days of Exercise per Week: 0 days  . Minutes of Exercise per Session: 0 min  Stress: No Stress Concern Present  . Feeling of Stress : Not at all  Social Connections: Moderately Integrated  . Frequency of Communication with Friends and Family: More than three times a week  . Frequency of Social Gatherings with Friends and Family: More than three times a week  . Attends Religious Services: More than 4 times per year  . Active Member of Clubs or Organizations: No  . Attends Archivist Meetings: Never  . Marital Status: Married    Tobacco Counseling Counseling given: Not Answered   Clinical Intake:  Pre-visit preparation completed: Yes  Pain : No/denies pain     Nutritional Risks: Nausea/ vomitting/ diarrhea (Nausea occasionally due to a hiatal hernia.) Diabetes: No  How often do you need to have someone help you when you read instructions, pamphlets, or other written materials from your doctor or pharmacy?: 1 - Never  Diabetic? No  Interpreter Needed?: No  Information entered by :: [facility], LPN   Activities of Daily Living In your present state of health, do you have any difficulty performing the following activities: 07/29/2020 07/15/2020  Hearing? N N  Vision? N N  Difficulty concentrating or making decisions? N N  Walking or climbing stairs? Y N  Comment Occasionally with knee problems. -  Dressing or bathing? N N  Doing errands, shopping? N N  Preparing Food and eating ? N -  Using the Toilet? N -  In the past six months, have you accidently leaked urine? Y -  Comment Due to prolapsed bladder. -  Do you have problems with loss of bowel control? N -  Managing your Medications? N -  Managing your Finances? N -  Housekeeping or managing your Housekeeping? N -  Some recent data might be hidden    Patient Care Team: Birdie Sons,  MD as PCP - General (Family Medicine) [name], MD as Consulting Physician (Gastroenterology) Diamond Nickel, DO as Consulting Physician (Sports Medicine) Sharlet Salina, MD as Referring Physician (Physical Medicine and Rehabilitation) Olean Ree, MD as Consulting Physician (General Surgery) Lloyd Huger, MD as Consulting Physician (Oncology) Anell Barr, OD (Optometry) Rubie Maid, MD as Referring Physician (Obstetrics and Gynecology)  Indicate any recent Medical Services you may have received from other than [facility] providers in the past year (date may be approximate).     Assessment:   This is a routine wellness examination for [place].  Hearing/Vision screen No exam data present  Dietary issues and exercise activities discussed: Current Exercise Habits: The patient does not participate in regular exercise at present, Exercise limited by: orthopedic condition(s)  Goals    . DIET - INCREASE WATER INTAKE     Recommend to drink at least 6-8 8oz glasses of water per day.    . Prevent falls     Recommend to remove any items from the home that may cause slips or trips.      Depression Screen PHQ 2/9 Scores 07/15/2020 07/25/2019 11/10/2015  PHQ - 2 Score 0 0 0  PHQ- 9 Score 0 - -    Fall Risk Fall Risk  07/29/2020 07/15/2020 07/25/2019 11/10/2015  Falls in the past year? 1 1 0 No  Comment Due to a chair collapsing. - - -  Number falls in past yr: 0 0 - -  Injury with Fall? 0 0 - -  Follow up Falls prevention discussed - - -    Any stairs in or around the home? Yes  If so, are there any without handrails? No  Home free of loose throw rugs in walkways, pet beds, electrical cords, etc? Yes  Adequate lighting in your home to reduce risk of falls? Yes   ASSISTIVE DEVICES UTILIZED TO PREVENT FALLS:  Life alert? No  Use of a cane, walker or w/c? No  Grab bars in the bathroom? No  Shower chair or bench in shower? No  Elevated toilet seat or a handicapped  toilet? Yes    Cognitive Function: Declined today.         Immunizations Immunization History  Administered Date(s) Administered  . Influenza Split 09/11/2010, 08/25/2011, 08/28/2013  . Tdap 08/25/2011    TDAP status: Up to date Flu Vaccine status: Due fall 2021 Pneumococcal vaccine status: Declined,  Education has been provided regarding the importance of this vaccine but patient still declined. Advised may receive this vaccine at local pharmacy or Health Dept. Aware to provide a copy of the vaccination record if obtained from local pharmacy or Health Dept. Verbalized acceptance and understanding.  Covid-19 vaccine status: Declined, Education has been provided regarding the importance of this vaccine but patient still declined. Advised may receive this vaccine at local pharmacy or Health Dept.or vaccine clinic. Aware to provide a copy of the vaccination record if obtained from local pharmacy or Health Dept. Verbalized acceptance and understanding.  Qualifies for Shingles Vaccine? Yes   Zostavax completed No   Shingrix Completed?: No.    Education has been provided regarding the importance of this vaccine. Patient has been advised to call insurance company to determine out of pocket expense if they have not yet received this vaccine. Advised may also receive vaccine at local pharmacy or Health Dept. Verbalized acceptance and understanding.  Screening Tests Health Maintenance  Topic Date Due  . PNA vac Low Risk Adult (1 of 2 - PCV13) Never done  . INFLUENZA VACCINE  07/06/2020  . COVID-19 Vaccine (1) 08/14/2020 (Originally 05/25/1963)  . TETANUS/TDAP  08/24/2021  . COLONOSCOPY  09/02/2027  . DEXA SCAN  Completed  . Hepatitis C Screening  Completed  . MAMMOGRAM  Discontinued    Health Maintenance  Health Maintenance Due  Topic Date Due  . PNA vac Low  Risk Adult (1 of 2 - PCV13) Never done  . INFLUENZA VACCINE  07/06/2020    Colorectal cancer screening: Completed 09/01/17.  Repeat every 10 years Bone Density status: Completed 07/07/20. Results reflect: Previous DEXA scan was normal. No repeat needed unless advised by a physician.  Lung Cancer Screening: (Low Dose CT Chest recommended if Age 2-80 years, 30 pack-year currently smoking OR have quit w/in 15years.) does not qualify.   Additional Screening:  Hepatitis C Screening: Up to date  Vision Screening: Recommended annual ophthalmology exams for early detection of glaucoma and other disorders of the eye. Is the patient up to date with their annual eye exam?  Yes  Who is the provider or what is the name of the office in which the patient attends annual eye exams? Dr Ellin Mayhew. If pt is not established with a provider, would they like to be referred to a provider to establish care? No .   Dental Screening: Recommended annual dental exams for proper oral hygiene  Community Resource Referral / Chronic Care Management: CRR required this visit?  No   CCM required this visit?  No      Plan:     I have personally reviewed and noted the following in the patient's chart:   . Medical and social history . Use of alcohol, tobacco or illicit drugs  . Current medications and supplements . Functional ability and status . Nutritional status . Physical activity . Advanced directives . List of other physicians . Hospitalizations, surgeries, and ER visits in previous 12 months . Vitals . Screenings to include cognitive, depression, and falls . Referrals and appointments  In addition, I have reviewed and discussed with patient certain preventive protocols, quality metrics, and best practice recommendations. A written personalized care plan for preventive services as well as general preventive health recommendations were provided to patient.     Johnrobert Foti [place], [place]   01/02/7866   Nurse Notes: Pt declined receiving a future pneumonia, Covid or flu shot.

## 2020-07-29 ENCOUNTER — Ambulatory Visit (INDEPENDENT_AMBULATORY_CARE_PROVIDER_SITE_OTHER): Payer: Medicare Other

## 2020-07-29 ENCOUNTER — Other Ambulatory Visit: Payer: Self-pay

## 2020-07-29 ENCOUNTER — Ambulatory Visit: Payer: Self-pay | Admitting: Family Medicine

## 2020-07-29 DIAGNOSIS — Z0000 Encounter for general adult medical examination without abnormal findings: Secondary | ICD-10-CM

## 2020-07-29 NOTE — Patient Instructions (Signed)
Kaylee Reyes , Thank you for taking time to come for your Medicare Wellness Visit. I appreciate your ongoing commitment to your health goals. Please review the following plan we discussed and let me know if I can assist you in the future.   Screening recommendations/referrals: Colonoscopy: Up to date, due 08/2027 Mammogram: Does not qualify.  Bone Density: Previous DEXA scan was normal. No repeat needed unless advised by a physician. Recommended yearly ophthalmology/optometry visit for glaucoma screening and checkup Recommended yearly dental visit for hygiene and checkup  Vaccinations: Influenza vaccine: Due fall 2021 Pneumococcal vaccine: Currently due, declined receiving at this time.  Tdap vaccine: Up to date, due 08/2021 Shingles vaccine: Shingrix discussed. Please contact your pharmacy for coverage information.     Advanced directives: Please bring a copy of your POA (Power of Attorney) and/or Living Will to your next appointment.   Conditions/risks identified: Fall risk preventatives discussed today.  Next appointment: 11/17/20 @ 9:40 AM with Dr Caryn Section    Preventive Care 39 Years and Older, Female Preventive care refers to lifestyle choices and visits with your health care provider that can promote health and wellness. What does preventive care include?  A yearly physical exam. This is also called an annual well check.  Dental exams once or twice a year.  Routine eye exams. Ask your health care provider how often you should have your eyes checked.  Personal lifestyle choices, including:  Daily care of your teeth and gums.  Regular physical activity.  Eating a healthy diet.  Avoiding tobacco and drug use.  Limiting alcohol use.  Practicing safe sex.  Taking low-dose aspirin every day.  Taking vitamin and mineral supplements as recommended by your health care provider. What happens during an annual well check? The services and screenings done by your health care  provider during your annual well check will depend on your age, overall health, lifestyle risk factors, and family history of disease. Counseling  Your health care provider may ask you questions about your:  Alcohol use.  Tobacco use.  Drug use.  Emotional well-being.  Home and relationship well-being.  Sexual activity.  Eating habits.  History of falls.  Memory and ability to understand (cognition).  Work and work Statistician.  Reproductive health. Screening  You may have the following tests or measurements:  Height, weight, and BMI.  Blood pressure.  Lipid and cholesterol levels. These may be checked every 5 years, or more frequently if you are over 44 years old.  Skin check.  Lung cancer screening. You may have this screening every year starting at age 18 if you have a 30-pack-year history of smoking and currently smoke or have quit within the past 15 years.  Fecal occult blood test (FOBT) of the stool. You may have this test every year starting at age 72.  Flexible sigmoidoscopy or colonoscopy. You may have a sigmoidoscopy every 5 years or a colonoscopy every 10 years starting at age 80.  Hepatitis C blood test.  Hepatitis B blood test.  Sexually transmitted disease (STD) testing.  Diabetes screening. This is done by checking your blood sugar (glucose) after you have not eaten for a while (fasting). You may have this done every 1-3 years.  Bone density scan. This is done to screen for osteoporosis. You may have this done starting at age 9.  Mammogram. This may be done every 1-2 years. Talk to your health care provider about how often you should have regular mammograms. Talk with your health care provider  about your test results, treatment options, and if necessary, the need for more tests. Vaccines  Your health care provider may recommend certain vaccines, such as:  Influenza vaccine. This is recommended every year.  Tetanus, diphtheria, and acellular  pertussis (Tdap, Td) vaccine. You may need a Td booster every 10 years.  Zoster vaccine. You may need this after age 18.  Pneumococcal 13-valent conjugate (PCV13) vaccine. One dose is recommended after age 64.  Pneumococcal polysaccharide (PPSV23) vaccine. One dose is recommended after age 54. Talk to your health care provider about which screenings and vaccines you need and how often you need them. This information is not intended to replace advice given to you by your health care provider. Make sure you discuss any questions you have with your health care provider. Document Released: 12/19/2015 Document Revised: 08/11/2016 Document Reviewed: 09/23/2015 Elsevier Interactive Patient Education  2017 [place] Prevention in the Home Falls can cause injuries. They can happen to people of all ages. There are many things you can do to make your home safe and to help prevent falls. What can I do on the outside of my home?  Regularly fix the edges of walkways and driveways and fix any cracks.  Remove anything that might make you trip as you walk through a door, such as a raised step or threshold.  Trim any bushes or trees on the path to your home.  Use bright outdoor lighting.  Clear any walking paths of anything that might make someone trip, such as rocks or tools.  Regularly check to see if handrails are loose or broken. Make sure that both sides of any steps have handrails.  Any raised decks and porches should have guardrails on the edges.  Have any leaves, snow, or ice cleared regularly.  Use sand or salt on walking paths during winter.  Clean up any spills in your garage right away. This includes oil or grease spills. What can I do in the bathroom?  Use night lights.  Install grab bars by the toilet and in the tub and shower. Do not use towel bars as grab bars.  Use non-skid mats or decals in the tub or shower.  If you need to sit down in the shower, use a plastic,  non-slip stool.  Keep the floor dry. Clean up any water that spills on the floor as soon as it happens.  Remove soap buildup in the tub or shower regularly.  Attach bath mats securely with double-sided non-slip rug tape.  Do not have throw rugs and other things on the floor that can make you trip. What can I do in the bedroom?  Use night lights.  Make sure that you have a light by your bed that is easy to reach.  Do not use any sheets or blankets that are too big for your bed. They should not hang down onto the floor.  Have a firm chair that has side arms. You can use this for support while you get dressed.  Do not have throw rugs and other things on the floor that can make you trip. What can I do in the kitchen?  Clean up any spills right away.  Avoid walking on wet floors.  Keep items that you use a lot in easy-to-reach places.  If you need to reach something above you, use a strong step stool that has a grab bar.  Keep electrical cords out of the way.  Do not use floor polish or  wax that makes floors slippery. If you must use wax, use non-skid floor wax.  Do not have throw rugs and other things on the floor that can make you trip. What can I do with my stairs?  Do not leave any items on the stairs.  Make sure that there are handrails on both sides of the stairs and use them. Fix handrails that are broken or loose. Make sure that handrails are as long as the stairways.  Check any carpeting to make sure that it is firmly attached to the stairs. Fix any carpet that is loose or worn.  Avoid having throw rugs at the top or bottom of the stairs. If you do have throw rugs, attach them to the floor with carpet tape.  Make sure that you have a light switch at the top of the stairs and the bottom of the stairs. If you do not have them, ask someone to add them for you. What else can I do to help prevent falls?  Wear shoes that:  Do not have high heels.  Have rubber  bottoms.  Are comfortable and fit you well.  Are closed at the toe. Do not wear sandals.  If you use a stepladder:  Make sure that it is fully opened. Do not climb a closed stepladder.  Make sure that both sides of the stepladder are locked into place.  Ask someone to hold it for you, if possible.  Clearly mark and make sure that you can see:  Any grab bars or handrails.  First and last steps.  Where the edge of each step is.  Use tools that help you move around (mobility aids) if they are needed. These include:  Canes.  Walkers.  Scooters.  Crutches.  Turn on the lights when you go into a dark area. Replace any light bulbs as soon as they burn out.  Set up your furniture so you have a clear path. Avoid moving your furniture around.  If any of your floors are uneven, fix them.  If there are any pets around you, be aware of where they are.  Review your medicines with your doctor. Some medicines can make you feel dizzy. This can increase your chance of falling. Ask your doctor what other things that you can do to help prevent falls. This information is not intended to replace advice given to you by your health care provider. Make sure you discuss any questions you have with your health care provider. Document Released: 09/18/2009 Document Revised: 04/29/2016 Document Reviewed: 12/27/2014 Elsevier Interactive Patient Education  2017 [employer].

## 2020-09-02 ENCOUNTER — Other Ambulatory Visit: Payer: Self-pay | Admitting: Family Medicine

## 2020-09-02 NOTE — Addendum Note (Signed)
Addended by: Althea Charon D on: 09/02/2020 11:08 AM   Modules accepted: Orders

## 2020-09-02 NOTE — Telephone Encounter (Signed)
Medication Refill - Medication: levothyroxine (SYNTHROID) 75 MCG tablet  Pt needs a new Rx sent to [facility] / other pharmacy has Rx but its not 90 day supply and it cost $100 / Pt would like a year worth of refills [facility] to [facility] / please call pt when sent   Has the patient contacted their pharmacy? Yes.   (Agent: If no, request that the patient contact the pharmacy for the refill.) (Agent: If yes, when and what did the pharmacy advise?)  Preferred Pharmacy (with phone number or street name):  [place], [place] Phone:  [phone]  Fax:  [phone]     The pt is seen by Dr Lelon Huh, [facility]; will route to office for final disposition.  Agent: Please be advised that RX refills may take up to 3 business days. We ask that you follow-up with your pharmacy.

## 2020-09-02 NOTE — Telephone Encounter (Signed)
Medication Refill - Medication: levothyroxine (SYNTHROID) 75 MCG tablet  Pt needs a new Rx sent to [facility] / other pharmacy has Rx but its not 90 day supply and it cost $100 / Pt would like a year worth of [facility]ills [facility] / please call pt when sent   Has the patient contacted their pharmacy? Yes.   (Agent: If no, request that the patient contact the pharmacy for the refill.) (Agent: If yes, when and what did the pharmacy advise?)  Preferred Pharmacy (with phone number or street name):  [facility] - [address], [place] Phone:  [phone]  Fax:  [phone]       Agent: Please be advised that RX refills may take up to 3 business days. We ask that you follow-up with your pharmacy.

## 2020-09-03 NOTE — Telephone Encounter (Signed)
Pt called for an update on the refill request due to it being a mail order pharmacy. Pt requests that the Rx be sent asap so it can be included with her current refill order

## 2020-09-04 MED ORDER — LEVOTHYROXINE SODIUM 75 MCG PO TABS: 75.0000 ug | ORAL_TABLET | Freq: Every day | ORAL | 0 refills | Status: DC

## 2020-09-12 ENCOUNTER — Ambulatory Visit (INDEPENDENT_AMBULATORY_CARE_PROVIDER_SITE_OTHER): Payer: Medicare Other | Admitting: Adult Health

## 2020-09-12 ENCOUNTER — Other Ambulatory Visit: Payer: Self-pay

## 2020-09-12 ENCOUNTER — Encounter: Payer: Self-pay | Admitting: Adult Health

## 2020-09-12 VITALS — BP 148/68 | HR 65 | Temp 98.4°F | Resp 16 | Ht 66.0 in | Wt 286.0 lb

## 2020-09-12 DIAGNOSIS — R300 Dysuria: Secondary | ICD-10-CM | POA: Insufficient documentation

## 2020-09-12 DIAGNOSIS — B3731 Acute candidiasis of vulva and vagina: Secondary | ICD-10-CM

## 2020-09-12 DIAGNOSIS — B373 Candidiasis of vulva and vagina: Secondary | ICD-10-CM | POA: Diagnosis not present

## 2020-09-12 DIAGNOSIS — N390 Urinary tract infection, site not specified: Secondary | ICD-10-CM | POA: Insufficient documentation

## 2020-09-12 LAB — POCT URINALYSIS DIPSTICK
Appearance: ABNORMAL
Bilirubin, UA: NEGATIVE
Blood, UA: NEGATIVE
Glucose, UA: NEGATIVE
Ketones, UA: NEGATIVE
Nitrite, UA: NEGATIVE
Odor: NORMAL
Protein, UA: NEGATIVE
Spec Grav, UA: 1.01 (ref 1.010–1.025)
Urobilinogen, UA: 0.2 E.U./dL
pH, UA: 6 (ref 5.0–8.0)

## 2020-09-12 MED ORDER — NYSTATIN 100000 UNIT/GM EX OINT: 1.0000 "application " | TOPICAL_OINTMENT | Freq: Two times a day (BID) | CUTANEOUS | 0 refills | Status: DC

## 2020-09-12 MED ORDER — SULFAMETHOXAZOLE-TRIMETHOPRIM 800-160 MG PO TABS: 1.0000 | ORAL_TABLET | Freq: Two times a day (BID) | ORAL | 0 refills | Status: DC

## 2020-09-12 NOTE — Patient Instructions (Signed)
Nystatin skin cream or ointment What is this medicine? NYSTATIN (nye STAT in) is an antifungal medicine. It is used to treat certain kinds of fungal or yeast infections of the skin. This medicine may be used for other purposes; ask your health care provider or pharmacist if you have questions. COMMON BRAND NAME(S): Mycostatin, Nystex, Pediaderm AF What should I tell my health care provider before I take this medicine? They need to know if you have any of these conditions:  an unusual or allergic reaction to nystatin, other foods, dyes or preservatives  pregnant or trying to get pregnant  breast-feeding How should I use this medicine? This medicine is for external use on the skin only. Follow the directions on the prescription label. Wash hands before and after use. If treating a hand or nail infection, wash hands before use only. Apply a thin layer of this medicine to cover the affected skin and surrounding area. You can cover the area with a sterile gauze dressing (bandage). Do not use an airtight bandage (such as a plastic-covered bandage). Do not get the medicine in your eyes. If you do, rinse out with plenty of cool tap water. Use the full course of treatment prescribed, even if you think the infection is getting better. Use at regular intervals. Do not use your medicine more often than directed. Do not use this medicine for any condition other than the one for which it was prescribed. Talk to your pediatrician regarding the use of this medicine in children. Special care may be needed. Overdosage: If you think you have taken too much of this medicine contact a poison control center or emergency room at once. NOTE: This medicine is only for you. Do not share this medicine with others. What if I miss a dose? If you miss a dose, use it as soon as you can. If it is almost time for your next dose, use only that dose. Do not use double or extra doses. What may interact with this  medicine? Interactions are not expected. Do not use any other skin products on the affected area without telling your doctor or health care professional. This list may not describe all possible interactions. Give your health care provider a list of all the medicines, herbs, non-prescription drugs, or dietary supplements you use. Also tell them if you smoke, drink alcohol, or use illegal drugs. Some items may interact with your medicine. What should I watch for while using this medicine? Tell your doctor or health care professional if your symptoms do not improve after 3 days. After bathing make sure that your skin is very dry. Fungal infections like moist conditions. Do not walk around barefoot. To help prevent reinfection, wear freshly washed cotton, not synthetic, clothing. What side effects may I notice from receiving this medicine? Side effects that usually do not require medical attention (report to your doctor or health care professional if they continue or are bothersome):  skin irritation This list may not describe all possible side effects. Call your doctor for medical advice about side effects. You may report side effects to FDA at 1-800-FDA-1088. Where should I keep my medicine? Keep out of the reach of children. Store at room temperature 15 to 30 degrees C (59 to 86 degrees F). Throw away any unused medicine after the expiration date. NOTE: This sheet is a summary. It may not cover all possible information. If you have questions about this medicine, talk to your doctor, pharmacist, or health care provider.  2020  Elsevier/Gold Standard (2015-12-24 16:28:30) Urinary Tract Infection, Adult A urinary tract infection (UTI) is an infection of any part of the urinary tract. The urinary tract includes:  The kidneys.  The ureters.  The bladder.  The urethra. These organs make, store, and get rid of pee (urine) in the body. What are the causes? This is caused by germs (bacteria) in  your genital area. These germs grow and cause swelling (inflammation) of your urinary tract. What increases the risk? You are more likely to develop this condition if:  You have a small, thin tube (catheter) to drain pee.  You cannot control when you pee or poop (incontinence).  You are female, and: ? You use these methods to prevent pregnancy:  A medicine that kills sperm (spermicide).  A device that blocks sperm (diaphragm). ? You have low levels of a female hormone (estrogen). ? You are pregnant.  You have genes that add to your risk.  You are sexually active.  You take antibiotic medicines.  You have trouble peeing because of: ? A prostate that is bigger than normal, if you are female. ? A blockage in the part of your body that drains pee from the bladder (urethra). ? A kidney stone. ? A nerve condition that affects your bladder (neurogenic bladder). ? Not getting enough to drink. ? Not peeing often enough.  You have other conditions, such as: ? Diabetes. ? A weak disease-fighting system (immune system). ? Sickle cell disease. ? Gout. ? Injury of the spine. What are the signs or symptoms? Symptoms of this condition include:  Needing to pee right away (urgently).  Peeing often.  Peeing small amounts often.  Pain or burning when peeing.  Blood in the pee.  Pee that smells bad or not like normal.  Trouble peeing.  Pee that is cloudy.  Fluid coming from the vagina, if you are female.  Pain in the belly or lower back. Other symptoms include:  Throwing up (vomiting).  No urge to eat.  Feeling mixed up (confused).  Being tired and grouchy (irritable).  A fever.  Watery poop (diarrhea). How is this treated? This condition may be treated with:  Antibiotic medicine.  Other medicines.  Drinking enough water. Follow these instructions at home:  Medicines  Take over-the-counter and prescription medicines only as told by your doctor.  If you  were prescribed an antibiotic medicine, take it as told by your doctor. Do not stop taking it even if you start to feel better. General instructions  Make sure you: ? Pee until your bladder is empty. ? Do not hold pee for a long time. ? Empty your bladder after sex. ? Wipe from front to back after pooping if you are a female. Use each tissue one time when you wipe.  Drink enough fluid to keep your pee pale yellow.  Keep all follow-up visits as told by your doctor. This is important. Contact a doctor if:  You do not get better after 1-2 days.  Your symptoms go away and then come back. Get help right away if:  You have very bad back pain.  You have very bad pain in your lower belly.  You have a fever.  You are sick to your stomach (nauseous).  You are throwing up. Summary  A urinary tract infection (UTI) is an infection of any part of the urinary tract.  This condition is caused by germs in your genital area.  There are many risk factors for a UTI. These  include having a small, thin tube to drain pee and not being able to control when you pee or poop.  Treatment includes antibiotic medicines for germs.  Drink enough fluid to keep your pee pale yellow. This information is not intended to replace advice given to you by your health care provider. Make sure you discuss any questions you have with your health care provider. Document Revised: 11/09/2018 Document Reviewed: 06/01/2018 Elsevier Patient Education  [place]. Sulfamethoxazole; Trimethoprim, SMX-TMP tablets What is this medicine? SULFAMETHOXAZOLE; TRIMETHOPRIM or SMX-TMP (suhl fuh meth OK suh zohl; trye METH oh prim) is a combination of a sulfonamide antibiotic and a second antibiotic, trimethoprim. It is used to treat or prevent certain kinds of bacterial infections. It will not work for colds, flu, or other viral infections. This medicine may be used for other purposes; ask your health care provider or  pharmacist if you have questions. COMMON BRAND NAME(S): Bacter-Aid DS, Bactrim, Bactrim DS, Septra, Septra DS What should I tell my health care provider before I take this medicine? They need to know if you have any of these conditions:  G6PD deficiency  HIV or AIDS  kidney disease  liver disease  low platelets  low red blood cell counts  poor nutrition  stomach or intestine problems like colitis  thyroid disease  an unusual or allergic reaction to sulfamethoxazole, trimethoprim, sulfa drugs, other drugs, foods, dyes, or preservatives  pregnant or trying to get pregnant  breast-feeding How should I use this medicine? Take this medicine by mouth with a glass of water. Follow the directions on the prescription label. Take your medicine at regular intervals. Do not take it more often than directed. Take all of your medicine as directed even if you think you are better. Do not skip doses or stop your medicine early. Talk to your pediatrician regarding the use of this medicine in children. While this drug may be prescribed for children as young as 2 months for selected conditions, precautions do apply. Overdosage: If you think you have taken too much of this medicine contact a poison control center or emergency room at once. NOTE: This medicine is only for you. Do not share this medicine with others. What if I miss a dose? If you miss a dose, take it as soon as you can. If it is almost time for your next dose, take only that dose. Do not take double or extra doses. What may interact with this medicine? Do not take this medicine with any of the following medications:  dofetilide This medicine may also interact with the following medications:  amantadine  birth control pills  certain medicines for blood pressure, heart disease  certain medicines for depression, like amitriptyline  certain medicines for diabetes, like glipizide or glyburide  certain medicines that treat or  prevent blood clots like warfarin  cyclosporine  digoxin  diuretics  indomethacin  methotrexate  phenytoin  procainamide  pyrimethamine  zidovudine This list may not describe all possible interactions. Give your health care provider a list of all the medicines, herbs, non-prescription drugs, or dietary supplements you use. Also tell them if you smoke, drink alcohol, or use illegal drugs. Some items may interact with your medicine. What should I watch for while using this medicine? Tell your health care provider if your symptoms do not start to get better or if they get worse. Do not treat diarrhea with over the counter products. Contact your health care provider if you have diarrhea that lasts more  than 2 days or if it is severe and watery. This drug may cause serious skin reactions. They can happen weeks to months after starting the drug. Contact your health care provider right away if you notice fevers or flu-like symptoms with a rash. The rash may be red or purple and then turn into blisters or peeling of the skin. Or, you might notice a red rash with swelling of the face, lips or lymph nodes in your neck or under your arms. This drug can make you more sensitive to the sun. Keep out of the sun. If you cannot avoid being in the sun, wear protective clothing and sunscreen. Do not use sun lamps or tanning beds/booths. Be careful brushing or flossing your teeth or using a toothpick because you may get an infection or bleed more easily. If you have any dental work done, tell your dentist you are receiving this drug. What side effects may I notice from receiving this medicine? Side effects that you should report to your doctor or health care professional as soon as possible:  allergic reactions (skin rash, itching or hives; swelling of the face, lips, or tongue)  bloody or watery diarrhea  heartbeat rhythm changes (trouble breathing; chest pain; dizziness; fast, irregular heartbeat;  feeling faint or lightheaded, falls,)  fever  high potassium levels (chest pain; fast, irregular heartbeat; muscle weakness)  kidney injury (trouble passing urine or change in the amount of urine)  low blood sugar (feeling anxious; confusion; dizziness; increased hunger; unusually weak or tired; increased sweating; shakiness; cold, clammy skin; irritable; headache; blurred vision; fast heartbeat; loss of consciousness)  low red blood cell counts (trouble breathing; feeling faint; lightheaded, falls; unusually weak or tired)  rash, fever, and swollen lymph nodes  redness, blistering, peeling, or loosening of the skin, including inside the mouth  unusual bruising or bleeding Side effects that usually do not require medical attention (report to your doctor or health care professional if they continue or are bothersome):  loss of appetite  nausea  vomiting This list may not describe all possible side effects. Call your doctor for medical advice about side effects. You may report side effects to FDA at 1-800-FDA-1088. Where should I keep my medicine? Keep out of the reach of children. Store between 15 and 25 degrees C (59 to 77 degrees F). Protect from light. Keep the container tightly closed. Throw away any unused drug after the expiration date. NOTE: This sheet is a summary. It may not cover all possible information. If you have questions about this medicine, talk to your doctor, pharmacist, or health care provider.  2020 Elsevier/Gold Standard (2019-08-10 12:53:51)

## 2020-09-12 NOTE — Progress Notes (Signed)
I,April Miller,acting as a scribe for [employer], FNP.,have documented all relevant documentation on the behalf of Marcille Buffy, FNP,as directed by  Marcille Buffy, FNP while in the presence of Marcille Buffy, [place].   Established patient visit   Patient: Kaylee Reyes   DOB: 12/27/1950   69 y.o. Female  MRN: [id number] Visit Date: 09/12/2020  Today's healthcare provider: Marcille Buffy, FNP   Chief Complaint  Patient presents with  . Dysuria   Subjective    Dysuria  This is a new problem. The current episode started in the past 7 days. The pain is moderate. There has been no fever. Associated symptoms include chills. Pertinent negatives include no discharge, flank pain, frequency, hematuria, hesitancy, nausea, possible pregnancy, sweats, urgency or vomiting. She has tried nothing for the symptoms.    Patient has had burning upon urination for 1 week. Patient also has slight low back pain. No other symptoms. Patient has not been treating symptoms. She has burning when she urinates.  She denies any hematuria.  She reports mild chills last night, she turned off the ac and she felt better. No  Reported fever.  She denies any other symptoms. She otherwise feels well she reports.   She has also been using Vaseline on her external vagina due to irritation she gets from wearing pads, she does endorse some itching. No exposures or concerns.  Patient  denies any fever, body aches, rash, chest pain, shortness of breath, nausea, vomiting, or diarrhea.    Patient Active Problem List   Diagnosis Date Noted  . Urinary tract infection without hematuria 09/12/2020  . Dysuria 09/12/2020  . Vaginal yeast infection- external  09/12/2020  . Intractable vomiting   . Morbid obesity ([place]) 08/06/2019  . Menopause 03/23/2017  . Rectocele 03/23/2017  . Cystocele, midline 03/23/2017  . Vaginal atrophy 03/23/2017  . Left breast cancer with T3 tumor,  >5 cm in greatest dimension ([place])   . S/P bilateral mastectomy 10/28/2015  . Fatty infiltration of liver 10/01/2015  . Gonalgia 10/01/2015  . Body tinea 10/01/2015  . Invasive lobular carcinoma of right breast, stage 2 ([place]) 10/01/2015  . Hypothyroidism 09/25/2015  . Adult BMI 35.0-35.9 kg/sq m 09/25/2015  . Hypercholesterolemia 09/25/2015  . Hypertension 09/25/2015  . Allergic rhinitis 09/25/2015  . GERD (gastroesophageal reflux disease) 09/25/2015  . Alopecia 09/25/2015  . History of peptic ulcer disease 09/25/2015  . Eczema 09/25/2015   Past Medical History:  Diagnosis Date  . Anemia   . Cancer ([place]) 09/2015   breast cancer  . GERD (gastroesophageal reflux disease)   . History of chicken pox   . Hyperlipidemia   . Hypertension   . Hypothyroidism   . Neuropathy of foot    bilateral  . Pinched nerve   . PONV (postoperative nausea and vomiting)    exploratory surgery at [facility]  . Thyroid disease   . Tuberculosis exposure    Mother had TB.  Pt shows (+) on tests, but has never had.  . Vertigo    last episode over 1 yr ago  . Wears dentures    partial upper and lower   Past Surgical History:  Procedure Laterality Date  . ABDOMINAL HYSTERECTOMY  07/13/2013   Total. with BSO for postmenopausal bleeding and fibroids with large cervical polyp. Dr. Levy Sjogren and Dr. Glennon Mac at [facility]  . ABDOMINAL ULTRASOUND  04/17/2011   Hepatomegaly with borderline slenomegaly. Suggestive of fatty liver. s/p cholecystectomy. Portions of  aorta obscured  . BREAST SURGERY Right 2011   BREAST BIOPSY  . CESAREAN SECTION  1988  . CHOLECYSTECTOMY  1990  . COLONOSCOPY WITH PROPOFOL N/A 09/01/2017   Procedure: COLONOSCOPY WITH PROPOFOL;  Surgeon: [name], MD;  Location: [facility];  Service: Gastroenterology;  Laterality: N/A;  . DIAGNOSTIC LAPAROSCOPY    . ESOPHAGOGASTRODUODENOSCOPY (EGD) WITH PROPOFOL N/A 01/17/2020   Procedure: ESOPHAGOGASTRODUODENOSCOPY (EGD) WITH PROPOFOL;  Surgeon:  [name], MD;  Location: [facility] ENDOSCOPY;  Service: Endoscopy;  Laterality: N/A;  . EXPLORATORY LAPAROTOMY     Left breast  . MASTECTOMY    . MASTECTOMY W/ SENTINEL NODE BIOPSY Bilateral 10/28/2015   Procedure: MASTECTOMY WITH SENTINEL LYMPH NODE BIOPSY;  Surgeon: Hubbard Robinson, MD;  Location: [facility];  Service: General;  Laterality: Bilateral;  . PORT-A-CATH REMOVAL Left 10/11/2016   Procedure: REMOVAL PORT-A-CATH;  Surgeon: Hubbard Robinson, MD;  Location: [facility];  Service: General;  Laterality: Left;  . PORTACATH PLACEMENT Left 12/23/2015   Procedure: INSERTION PORT-A-CATH;  Surgeon: Hubbard Robinson, MD;  Location: [facility] OR[facility];  Service: General;  Laterality: Left;  . WRIST SURGERY     Social History   Tobacco Use  . Smoking status: Never Smoker  . Smokeless tobacco: Never Used  Vaping Use  . Vaping Use: Never used  Substance Use Topics  . Alcohol use: No    Alcohol/week: 0.0 standard drinks  . Drug use: No   Social History   Socioeconomic History  . Marital status: Married    Spouse name: Not on file  . Number of children: 2  . Years of education: Not on file  . Highest education level: Some college, no degree  Occupational History  . Occupation: retired  Tobacco Use  . Smoking status: Never Smoker  . Smokeless tobacco: Never Used  Vaping Use  . Vaping Use: Never used  Substance and Sexual Activity  . Alcohol use: No    Alcohol/week: 0.0 standard drinks  . Drug use: No  . Sexual activity: Never    Birth control/protection: Surgical  Other Topics Concern  . Not on file  Social History Narrative  . Not on file   Social Determinants of Health   Financial Resource Strain: Low Risk   . Difficulty of Paying Living Expenses: Not hard at all  Food Insecurity: No Food Insecurity  . Worried About Charity fundraiser in the Last Year: Never true  . Ran Out of Food in the Last Year: Never true  Transportation Needs: No Transportation Needs  . Lack of  Transportation (Medical): No  . Lack of Transportation (Non-Medical): No  Physical Activity: Inactive  . Days of Exercise per Week: 0 days  . Minutes of Exercise per Session: 0 min  Stress: No Stress Concern Present  . Feeling of Stress : Not at all  Social Connections: Moderately Integrated  . Frequency of Communication with Friends and Family: More than three times a week  . Frequency of Social Gatherings with Friends and Family: More than three times a week  . Attends Religious Services: More than 4 times per year  . Active Member of Clubs or Organizations: No  . Attends Archivist Meetings: Never  . Marital Status: Married  Human resources officer Violence: Not At Risk  . Fear of Current or Ex-Partner: No  . Emotionally Abused: No  . Physically Abused: No  . Sexually Abused: No   Family Status  Relation Name Status  . Mother  Deceased  . Father  Deceased  . Brother  Deceased  . Daughter Freda Munro Alive  . Neg Hx  (Not Specified)   Family History  Problem Relation Age of Onset  . Hypertension Mother   . Thyroid disease Mother   . Hypertension Brother   . Diabetes Brother   . Breast cancer Neg Hx   . Ovarian cancer Neg Hx   . Colon cancer Neg Hx    Allergies  Allergen Reactions  . Penicillins Shortness Of Breath    Has patient had a PCN reaction causing immediate rash, facial/tongue/throat swelling, SOB or lightheadedness with hypotension: Yes Has patient had a PCN reaction causing severe rash involving mucus membranes or skin necrosis: No Has patient had a PCN reaction that required hospitalization No Has patient had a PCN reaction occurring within the last 10 years: No If all of the above answers are "NO", then may proceed with Cephalosporin use.   . Nickel Hives  . Pravastatin Sodium Other (See Comments)    Myalgia        Medications: Outpatient Medications Prior to Visit  Medication Sig  . albuterol (VENTOLIN HFA) 108 (90 Base) MCG/ACT inhaler  INHALE 2 PUFFS BY MOUTH EVERY 6 HOURS AS NEEDED WHEEZING OR SHORTNESS OF BREATH  . atenolol (TENORMIN) 50 MG tablet Take 1 tablet (50 mg total) by mouth daily.  . B Complex-C (SUPER B COMPLEX PO) Take 1 tablet by mouth daily.  Marland Kitchen CALCIUM CITRATE PO Take 1 tablet by mouth daily.  . Carboxymeth-Glycerin-Polysorb (REFRESH OPTIVE MEGA-3 OP) Place 1 drop into both eyes daily.  Marland Kitchen docusate sodium (COLACE) 100 MG capsule Take 200 mg by mouth daily.   Marland Kitchen ELDERBERRY PO Take by mouth daily.   . fexofenadine (ALLEGRA) 180 MG tablet Take 180 mg by mouth daily.  . hydrochlorothiazide (HYDRODIURIL) 25 MG tablet Take 1 tablet (25 mg total) by mouth daily.  Marland Kitchen ketoconazole (NIZORAL) 2 % cream Apply 1 application topically daily.  . lansoprazole (PREVACID) 30 MG capsule Take 1 capsule (30 mg total) by mouth daily at 12 noon.  Marland Kitchen letrozole (FEMARA) 2.5 MG tablet TAKE 1 TABLET DAILY (ACCORD BRAND ONLY)  . levothyroxine (SYNTHROID) 75 MCG tablet Take 1 tablet (75 mcg total) by mouth daily.  . magic mouthwash SOLN 1 part 2% viscous lidocaine: 1 part Maalox: 1 part diphenhydramine 12.5 mg per 5 ml elixir. Rinse and spit 5 ml every six hour as needed  . Magnesium 400 MG TABS Take 600 mg by mouth daily.   . meclizine (ANTIVERT) 25 MG tablet TAKE 1 TABLET BY MOUTH EVERY 4 TO 6 HOURS AS NEEDED FOR NAUSEA  . Misc Natural Products (TURMERIC CURCUMIN) CAPS Take 1 capsule by mouth daily.  Marland Kitchen triamcinolone cream (KENALOG) 0.5 % Apply 1 application topically 2 (two) times daily as needed. For itching  . Vitamin D, Ergocalciferol, (DRISDOL) 1.25 MG (50000 UNIT) CAPS capsule TAKE 1 CAPSULE BY MOUTH 1 TIME A WEEK  . White Petrolatum-Mineral Oil (EYE LUBRICANT) OINT Apply to eye. [place] Night as needed for eyes at night.  . [DISCONTINUED] nystatin (NYSTATIN) powder Apply topically 4 (four) times daily.  Marland Kitchen ezetimibe (ZETIA) 10 MG tablet Take 1 tablet (10 mg total) by mouth daily. (Patient not taking: Reported on 07/29/2020)  . ibuprofen  (ADVIL,MOTRIN) 200 MG tablet Take 400 mg by mouth every 6 (six) hours as needed for mild pain. (Patient not taking: Reported on 07/29/2020)  . Olopatadine HCl (PATADAY OP) Apply to eye. (Patient not  taking: Reported on 07/29/2020)   No facility-administered medications prior to visit.    Review of Systems  Constitutional: Positive for chills. Negative for appetite change, fatigue and fever.  Respiratory: Negative for chest tightness and shortness of breath.   Cardiovascular: Negative for chest pain and palpitations.  Gastrointestinal: Negative for abdominal pain, nausea and vomiting.  Genitourinary: Positive for dysuria. Negative for flank pain, frequency, hematuria, hesitancy and urgency.  Neurological: Negative for dizziness and weakness.    Last CBC Lab Results  Component Value Date   WBC 6.7 07/15/2020   HGB 12.0 07/15/2020   HCT 36.0 07/15/2020   MCV 88 07/15/2020   MCH 29.2 07/15/2020   RDW 13.2 07/15/2020   PLT 207 26/83/4196   Last metabolic panel Lab Results  Component Value Date   GLUCOSE 107 (H) 07/15/2020   NA 141 07/15/2020   K 4.0 07/15/2020   CL 100 07/15/2020   CO2 24 07/15/2020   BUN 11 07/15/2020   CREATININE 1.04 (H) 07/15/2020   GFRNONAA 55 (L) 07/15/2020   GFRAA 64 07/15/2020   CALCIUM 9.9 07/15/2020   PHOS 3.3 11/20/2019   PROT 7.7 07/15/2020   ALBUMIN 4.5 07/15/2020   LABGLOB 3.2 07/15/2020   AGRATIO 1.4 07/15/2020   BILITOT 0.5 07/15/2020   ALKPHOS 114 07/15/2020   AST 42 (H) 07/15/2020   ALT 52 (H) 07/15/2020   ANIONGAP 5 09/20/2016   Last vitamin D No results found for: 25OHVITD2, 25OHVITD3, VD25OH    Objective    BP (!) 148/68 (BP Location: Left Arm, Patient Position: Sitting, Cuff Size: Large)   Pulse 65   Temp 98.4 F (36.9 C) (Oral)   Resp 16   Ht 5\' 6"  (1.676 m)   Wt 286 lb (129.7 kg)   SpO2 98%   BMI 46.16 kg/m  BP Readings from Last 3 Encounters:  09/12/20 (!) 148/68  07/15/20 (!) 142/88  03/13/20 (!) 173/106   Wt  Readings from Last 3 Encounters:  09/12/20 286 lb (129.7 kg)  07/15/20 284 lb (128.8 kg)  03/13/20 282 lb 11.2 oz (128.2 kg)      Physical Exam Constitutional:      General: She is not in acute distress.    Appearance: Normal appearance. She is obese. She is not ill-appearing, toxic-appearing or diaphoretic.     Comments: Patient is alert and oriented and responsive to questions Engages in eye contact with provider. Speaks in full sentences without any pauses without any shortness of breath or distress.    HENT:     Head: Normocephalic and atraumatic.     Right Ear: External ear normal.     Left Ear: External ear normal.     Nose: Nose normal.     Mouth/Throat:     Mouth: Mucous membranes are moist.  Eyes:     Conjunctiva/sclera: Conjunctivae normal.     Pupils: Pupils are equal, round, and reactive to light.  Cardiovascular:     Rate and Rhythm: Normal rate and regular rhythm.     Pulses: Normal pulses.     Heart sounds: Normal heart sounds.  Pulmonary:     Effort: Pulmonary effort is normal.     Breath sounds: Normal breath sounds.  Abdominal:     General: There is no distension.     Palpations: Abdomen is soft. There is no mass.     Tenderness: There is no abdominal tenderness. There is no right CVA tenderness, left CVA tenderness, guarding or rebound.  Hernia: No hernia is present.  Genitourinary:    Comments: Declined exam  Musculoskeletal:        General: Normal range of motion.     Cervical back: Normal range of motion and neck supple.  Lymphadenopathy:     Cervical: No cervical adenopathy.  Skin:    General: Skin is warm.     Capillary Refill: Capillary refill takes less than 2 seconds.  Neurological:     General: No focal deficit present.     Mental Status: She is alert and oriented to person, place, and time.     Motor: No weakness.     Gait: Gait normal.  Psychiatric:        Mood and Affect: Mood normal.        Behavior: Behavior normal.         Thought Content: Thought content normal.        Judgment: Judgment normal.      Results for orders placed or performed in visit on 09/12/20  POCT urinalysis dipstick  Result Value Ref Range   Color, UA Yellow    Clarity, UA Cloudy    Glucose, UA Negative Negative   Bilirubin, UA Negative    Ketones, UA Negative    Spec Grav, UA 1.010 1.010 - 1.025   Blood, UA Negative    pH, UA 6.0 5.0 - 8.0   Protein, UA Negative Negative   Urobilinogen, UA 0.2 0.2 or 1.0 E.U./dL   Nitrite, UA Negative    Leukocytes, UA Large (3+) (A) Negative   Appearance Abnormal    Odor Normal     Assessment & Plan     Urinary tract infection without hematuria, site unspecified  Dysuria - Plan: POCT urinalysis dipstick, CBC with Differential/Platelet, Urine Culture, Comprehensive Metabolic Panel (CMET)  Vaginal yeast infection- external   Meds ordered this encounter  Medications  . sulfamethoxazole-trimethoprim (BACTRIM DS) 800-160 MG tablet    Sig: Take 1 tablet by mouth 2 (two) times daily.    Dispense:  10 tablet    Refill:  0  . nystatin ointment (MYCOSTATIN)    Sig: Apply 1 application topically 2 (two) times daily.    Dispense:  30 g    Refill:  0  discontinue Vaseline, try nystatin cream as directed, return for further evaluation if persists is advised.  Leukocytes in urine, allergy to penicillins  Meds ordered this encounter  Medications  . sulfamethoxazole-trimethoprim (BACTRIM DS) 800-160 MG tablet    Sig: Take 1 tablet by mouth 2 (two) times daily.    Dispense:  10 tablet    Refill:  0  . nystatin ointment (MYCOSTATIN)    Sig: Apply 1 application topically 2 (two) times daily.    Dispense:  30 g    Refill:  0   She would like to have her CMP rechecked today, had mild elevation in creatinine at last check, reviewed with her Dr.Fisher's note and hydration. Will check CBC given chills suspected last night, and also discussed red flags and signs of worsening infection or  pyelonephritis.  Red Flags discussed. The patient was given clear instructions to go to ER or return to medical center if any red flags develop, symptoms do not improve, worsen or new problems develop. They verbalized understanding.   Return for at any time for any worsening symptoms, Go to Emergency room/ urgent care if worse.      IWellington Hampshire Avigdor Dollar, FNP, have reviewed all documentation for this visit. The  documentation on 09/12/20 for the exam, diagnosis, procedures, and orders are all accurate and complete.  Addressed acute and or chronic medical problems today requiring 30 minutes reviewing patients medical record,labs, counseling patient regarding patient's conditions, any medications, answering questions regarding health, and coordination of care as needed. After visit summary patient given copy and reviewed.   Marcille Buffy, [place] [phone] (phone) [phone] (fax)  [place]

## 2020-09-12 NOTE — Progress Notes (Signed)
Treated in office with Bactrim- urine sent for culture.

## 2020-09-13 LAB — CBC WITH DIFFERENTIAL/PLATELET
Basophils Absolute: 0 10*3/uL (ref 0.0–0.2)
Basos: 1 %
EOS (ABSOLUTE): 0.1 10*3/uL (ref 0.0–0.4)
Eos: 2 %
Hematocrit: 34.1 % (ref 34.0–46.6)
Hemoglobin: 11.5 g/dL (ref 11.1–15.9)
Immature Grans (Abs): 0.1 10*3/uL (ref 0.0–0.1)
Immature Granulocytes: 1 %
Lymphocytes Absolute: 1.4 10*3/uL (ref 0.7–3.1)
Lymphs: 17 %
MCH: 29.4 pg (ref 26.6–33.0)
MCHC: 33.7 g/dL (ref 31.5–35.7)
MCV: 87 fL (ref 79–97)
Monocytes Absolute: 0.4 10*3/uL (ref 0.1–0.9)
Monocytes: 4 %
Neutrophils Absolute: 6.3 10*3/uL (ref 1.4–7.0)
Neutrophils: 75 %
Platelets: 206 10*3/uL (ref 150–450)
RBC: 3.91 x10E6/uL (ref 3.77–5.28)
RDW: 13.2 % (ref 11.7–15.4)
WBC: 8.3 10*3/uL (ref 3.4–10.8)

## 2020-09-13 LAB — COMPREHENSIVE METABOLIC PANEL
ALT: 41 IU/L — ABNORMAL HIGH (ref 0–32)
AST: 37 IU/L (ref 0–40)
Albumin/Globulin Ratio: 1.7 (ref 1.2–2.2)
Albumin: 4.3 g/dL (ref 3.8–4.8)
Alkaline Phosphatase: 110 IU/L (ref 44–121)
BUN/Creatinine Ratio: 15 (ref 12–28)
BUN: 12 mg/dL (ref 8–27)
Bilirubin Total: 0.5 mg/dL (ref 0.0–1.2)
CO2: 26 mmol/L (ref 20–29)
Calcium: 9.4 mg/dL (ref 8.7–10.3)
Chloride: 99 mmol/L (ref 96–106)
Creatinine, Ser: 0.82 mg/dL (ref 0.57–1.00)
GFR calc Af Amer: 85 mL/min/{1.73_m2} (ref >59)
GFR calc non Af Amer: 74 mL/min/{1.73_m2} (ref >59)
Globulin, Total: 2.6 g/dL (ref 1.5–4.5)
Glucose: 94 mg/dL (ref 65–99)
Potassium: 4 mmol/L (ref 3.5–5.2)
Sodium: 141 mmol/L (ref 134–144)
Total Protein: 6.9 g/dL (ref 6.0–8.5)

## 2020-09-15 ENCOUNTER — Telehealth: Payer: Self-pay | Admitting: Family Medicine

## 2020-09-15 DIAGNOSIS — R1032 Left lower quadrant pain: Secondary | ICD-10-CM

## 2020-09-15 NOTE — Telephone Encounter (Signed)
Patient would like the nurse to call her regarding her lab results on My Chart.  She has some questions and would like to discuss her antibiotic.  Please call to discuss at [phone]

## 2020-09-15 NOTE — Telephone Encounter (Addendum)
Pt states she is not better and has been on the abx with no relief.  Pt would like a call back asap.    Pt wants to know if this is the right abx, because she only has one day left.   Pt feels like she should have seen some change.  Pt saw Sharyn Lull for this issue.

## 2020-09-16 ENCOUNTER — Other Ambulatory Visit: Payer: Self-pay | Admitting: Adult Health

## 2020-09-16 ENCOUNTER — Telehealth: Payer: Self-pay

## 2020-09-16 LAB — URINE CULTURE

## 2020-09-16 MED ORDER — CIPROFLOXACIN HCL 500 MG PO TABS: 500.0000 mg | ORAL_TABLET | Freq: Two times a day (BID) | ORAL | 0 refills | Status: DC

## 2020-09-16 MED ORDER — SULFAMETHOXAZOLE-TRIMETHOPRIM 800-160 MG PO TABS
1.0000 | ORAL_TABLET | Freq: Two times a day (BID) | ORAL | 0 refills | Status: DC
Start: 2020-09-16 — End: 2020-09-22

## 2020-09-16 MED ORDER — MECLIZINE HCL 12.5 MG PO TABS: 12.5000 mg | ORAL_TABLET | Freq: Three times a day (TID) | ORAL | 0 refills | Status: DC | PRN

## 2020-09-16 NOTE — Telephone Encounter (Addendum)
Provider called patient, she denies any fever or chills, she has had some nausea after taking Bactrim, no other associated symptoms.  She has some dysuria and mild pelvic pressure  still after 3 days of Bactrim. Denies back pain.  She was requesting a new antibiotic while preliminary culture was out this morning, was changing to Cipro, however final culture returned and was E coli to cipro.  She agrees to try and complete Bactrim, understands we discontinued Cipro.    Patient  denies any fever, body aches,chills, rash, chest pain, shortness of breath, nausea, vomiting, or diarrhea.  She is to call back 10/13-10/14/2021 if any symptoms persisting.  Advised patient call the office or your primary care doctor for an appointment if no improvement within 72 hours or if any symptoms change or worsen at any time  Advised ER or urgent Care if after hours or on weekend. Call 911 for emergency symptoms at any time.Patinet verbalized understanding of all instructions given/reviewed and treatment plan and has no further questions or concerns at this time.     Repeat culture 1 week after completion of antibiotic is advised,

## 2020-09-16 NOTE — Telephone Encounter (Signed)
Copied from [place] [phone]. Topic: General - Other >> Sep 16, 2020 11:26 AM Rainey Pines A wrote: Patient requesting callback from Nunzio Cory in regards to clarification on medication they discussed

## 2020-09-16 NOTE — Progress Notes (Signed)
Meds ordered this encounter  Medications  . meclizine (ANTIVERT) 12.5 MG tablet    Sig: Take 1 tablet (12.5 mg total) by mouth 3 (three) times daily as needed for dizziness.    Dispense:  90 tablet    Refill:  0  reorder

## 2020-09-16 NOTE — Telephone Encounter (Signed)
Patient called back office stating that her symptoms are worse. Patient states that she started antibiotic on Friday and reports that fever resolved, but states the following symptoms are now worse. Dysuria,nausea, fullness in abdomen and pelvic pressure. Patient wanted to know results of culture and I advised her that only the preliminary report was back as of now, she states that she feels that she should be on a different antibiotic, she has three days left of current but states that she does not want to continue it. Patient uses [facility] representative in [place]

## 2020-09-16 NOTE — Telephone Encounter (Signed)
Patient was advised of change to antibiotic. Kaylee Reyes

## 2020-09-16 NOTE — Telephone Encounter (Addendum)
Meds ordered this encounter  Medications  . ciprofloxacin (CIPRO) 500 MG tablet    Sig: Take 1 tablet (500 mg total) by mouth 2 (two) times daily for 7 days.    Dispense:  14 tablet    Refill:  0    Medications Discontinued During This Encounter  Medication Reason  . ciprofloxacin (CIPRO) 500 MG tablet Reorder  . sulfamethoxazole-trimethoprim (BACTRIM DS) 800-160 MG tablet Completed Course

## 2020-09-16 NOTE — Telephone Encounter (Signed)
Done

## 2020-09-16 NOTE — Telephone Encounter (Signed)
Gram negative rods culture still preliminary, I will switch her to Cipro 500 mg BID for 7 days., tendon rupture and C difficile are possible side effects with Cipro, appears she has taken prior and no side effects just FYI.   We will wait on culture and stop Bactrim. She should return to office if any signs or symtoms worsen or fever returns at anytime.

## 2020-09-16 NOTE — Telephone Encounter (Signed)
Provider called patient

## 2020-09-16 NOTE — Telephone Encounter (Signed)
Patient has been advised, she states that another nausea medication would be expensive and wants to know if you can refill Meclizine? kW

## 2020-09-16 NOTE — Progress Notes (Signed)
Labs improved since 2 months ago.  Urine culture resulted E coli, resistant to Cipro, so she should not pick up the Cipro and finish the Bactrim as it is susceptible to it, just be sure she eats crackers/ food with it and return to office if symptoms persist, change or worsen at anytime. Increase Hydration. I will send in Bactrim for 5 more days if she agrees to take let me know.   Recommend repeat urine 1 week after finishing antibiotics to be sure infection has cleared.

## 2020-09-16 NOTE — Telephone Encounter (Signed)
[id number] 

## 2020-09-20 ENCOUNTER — Other Ambulatory Visit: Payer: Self-pay | Admitting: Family Medicine

## 2020-09-20 NOTE — Telephone Encounter (Signed)
   Notes to clinic Has already had a curtesy refill, do not see med/dx addressed, please assess

## 2020-09-22 ENCOUNTER — Other Ambulatory Visit: Payer: Self-pay

## 2020-09-22 ENCOUNTER — Encounter: Payer: Self-pay | Admitting: Physician Assistant

## 2020-09-22 ENCOUNTER — Ambulatory Visit (INDEPENDENT_AMBULATORY_CARE_PROVIDER_SITE_OTHER): Payer: Medicare Other | Admitting: Physician Assistant

## 2020-09-22 VITALS — BP 148/77 | HR 75 | Temp 99.3°F | Resp 16 | Wt 288.2 lb

## 2020-09-22 DIAGNOSIS — B372 Candidiasis of skin and nail: Secondary | ICD-10-CM | POA: Diagnosis not present

## 2020-09-22 DIAGNOSIS — N390 Urinary tract infection, site not specified: Secondary | ICD-10-CM

## 2020-09-22 DIAGNOSIS — R300 Dysuria: Secondary | ICD-10-CM | POA: Diagnosis not present

## 2020-09-22 LAB — POCT URINALYSIS DIPSTICK
Bilirubin, UA: NEGATIVE
Blood, UA: NEGATIVE
Glucose, UA: NEGATIVE
Ketones, UA: NEGATIVE
Nitrite, UA: NEGATIVE
Protein, UA: NEGATIVE
Spec Grav, UA: 1.01 (ref 1.010–1.025)
Urobilinogen, UA: 0.2 E.U./dL
pH, UA: 6.5 (ref 5.0–8.0)

## 2020-09-22 MED ORDER — MAGIC MOUTHWASH: ORAL | 3 refills | Status: DC

## 2020-09-22 MED ORDER — KETOCONAZOLE 2 % EX CREA: 1.0000 "application " | TOPICAL_CREAM | Freq: Every day | CUTANEOUS | 3 refills | Status: DC

## 2020-09-22 MED ORDER — NITROFURANTOIN MONOHYD MACRO 100 MG PO CAPS: 100.0000 mg | ORAL_CAPSULE | Freq: Two times a day (BID) | ORAL | 0 refills | Status: DC

## 2020-09-22 MED ORDER — NYSTATIN 100000 UNIT/GM EX POWD: 1.0000 "application " | Freq: Three times a day (TID) | CUTANEOUS | 0 refills | Status: DC

## 2020-09-22 NOTE — Telephone Encounter (Signed)
She needs follow up and recheck of urine / exam and  visit this afternoon preferably this afternoon.    Thank you,  Laverna Peace MSN, AGNP-C, FNP-C  Family Nurse Practitioner  Adult Geriatric Nurse Practitioner

## 2020-09-22 NOTE — Progress Notes (Signed)
Established patient visit   Patient: Kaylee Reyes   DOB: 08/30/1951   69 y.o. Female  MRN: [id number] Visit Date: 09/22/2020  Today's healthcare provider: Mar Daring, PA-C   Chief Complaint  Patient presents with  . Follow-up   Subjective    HPI  Patient coming in to have urine recheck. Patient was seen over a week ago by another provider in the office and was treated with Bactrim. Per patient she still having burning. Reports that her urine is bright yellow because she takes B complex. Feels that the Bactrim didn't work. No hematuria, dischrage or pelvic pain. Pain/burning is better, but still has the first thing in the morning.   Patient Active Problem List   Diagnosis Date Noted  . Urinary tract infection without hematuria 09/12/2020  . Dysuria 09/12/2020  . Vaginal yeast infection- external  09/12/2020  . Intractable vomiting   . Morbid obesity ([place]) 08/06/2019  . Menopause 03/23/2017  . Rectocele 03/23/2017  . Cystocele, midline 03/23/2017  . Vaginal atrophy 03/23/2017  . Left breast cancer with T3 tumor, >5 cm in greatest dimension ([place])   . S/P bilateral mastectomy 10/28/2015  . Fatty infiltration of liver 10/01/2015  . Gonalgia 10/01/2015  . Body tinea 10/01/2015  . Invasive lobular carcinoma of right breast, stage 2 ([place]) 10/01/2015  . Hypothyroidism 09/25/2015  . Adult BMI 35.0-35.9 kg/sq m 09/25/2015  . Hypercholesterolemia 09/25/2015  . Hypertension 09/25/2015  . Allergic rhinitis 09/25/2015  . GERD (gastroesophageal reflux disease) 09/25/2015  . Alopecia 09/25/2015  . History of peptic ulcer disease 09/25/2015  . Eczema 09/25/2015   Past Medical History:  Diagnosis Date  . Anemia   . Cancer ([place]) 09/2015   breast cancer  . GERD (gastroesophageal reflux disease)   . History of chicken pox   . Hyperlipidemia   . Hypertension   . Hypothyroidism   . Neuropathy of foot    bilateral  . Pinched nerve   . PONV (postoperative nausea and  vomiting)    explor[facility] surgery at [facility]  . Thyroid disease   . Tuberculosis exposure    Mother had TB.  Pt shows (+) on tests, but has never had.  . Vertigo    last episode over 1 yr ago  . Wears dentures    partial upper and lower       Medications: Outpatient Medications Prior to Visit  Medication Sig  . albuterol (VENTOLIN HFA) 108 (90 Base) MCG/ACT inhaler INHALE 2 PUFFS BY MOUTH EVERY 6 HOURS AS NEEDED WHEEZING OR SHORTNESS OF BREATH  . atenolol (TENORMIN) 50 MG tablet TAKE 1 TABLET BY MOUTH EVERY DAY  . B Complex-C (SUPER B COMPLEX PO) Take 1 tablet by mouth daily.  Marland Kitchen CALCIUM CITRATE PO Take 1 tablet by mouth daily.  . Carboxymeth-Glycerin-Polysorb (REFRESH OPTIVE MEGA-3 OP) Place 1 drop into both eyes daily.  Marland Kitchen docusate sodium (COLACE) 100 MG capsule Take 200 mg by mouth daily.   Marland Kitchen ELDERBERRY PO Take by mouth daily.   . fexofenadine (ALLEGRA) 180 MG tablet Take 180 mg by mouth daily.  . hydrochlorothiazide (HYDRODIURIL) 25 MG tablet Take 1 tablet (25 mg total) by mouth daily.  Marland Kitchen ibuprofen (ADVIL,MOTRIN) 200 MG tablet Take 400 mg by mouth every 6 (six) hours as needed for mild pain.   Marland Kitchen ketoconazole (NIZORAL) 2 % cream Apply 1 application topically daily.  . lansoprazole (PREVACID) 30 MG capsule Take 1 capsule (30 mg total) by mouth daily at 12  noon.  . letrozole (FEMARA) 2.5 MG tablet TAKE 1 TABLET BY MOUTH EVERY DAY  . levothyroxine (SYNTHROID) 75 MCG tablet Take 1 tablet (75 mcg total) by mouth daily.  . magic mouthwash SOLN 1 part 2% viscous lidocaine: 1 part Maalox: 1 part diphenhydramine 12.5 mg per 5 ml elixir. Rinse and spit 5 ml every six hour as needed  . Magnesium 400 MG TABS Take 600 mg by mouth daily.   . meclizine (ANTIVERT) 12.5 MG tablet Take 1 tablet (12.5 mg total) by mouth 3 (three) times daily as needed for dizziness.  . Misc Natural Products (TURMERIC CURCUMIN) CAPS Take 1 capsule by mouth daily.  Marland Kitchen nystatin ointment (MYCOSTATIN) Apply 1 application  topically 2 (two) times daily.  . Olopatadine HCl (PATADAY OP) Apply to eye.   . sulfamethoxazole-trimethoprim (BACTRIM DS) 800-160 MG tablet Take 1 tablet by mouth 2 (two) times daily.  Marland Kitchen triamcinolone cream (KENALOG) 0.5 % Apply 1 application topically 2 (two) times daily as needed. For itching  . Vitamin D, Ergocalciferol, (DRISDOL) 1.25 MG (50000 UNIT) CAPS capsule TAKE 1 CAPSULE BY MOUTH 1 TIME A WEEK  . White Petrolatum-Mineral Oil (EYE LUBRICANT) OINT Apply to eye. Hylo Night as needed for eyes at night.  . ezetimibe (ZETIA) 10 MG tablet Take 1 tablet (10 mg total) by mouth daily. (Patient not taking: Reported on 07/29/2020)   No facility-administered medications prior to visit.    Review of Systems  Constitutional: Negative.   Respiratory: Negative.   Cardiovascular: Negative.   Genitourinary: Positive for dysuria and frequency.  Neurological: Negative.     Last CBC Lab Results  Component Value Date   WBC 8.3 09/12/2020   HGB 11.5 09/12/2020   HCT 34.1 09/12/2020   MCV 87 09/12/2020   MCH 29.4 09/12/2020   RDW 13.2 09/12/2020   PLT 206 60/45/4098   Last metabolic panel Lab Results  Component Value Date   GLUCOSE 94 09/12/2020   NA 141 09/12/2020   K 4.0 09/12/2020   CL 99 09/12/2020   CO2 26 09/12/2020   BUN 12 09/12/2020   CREATININE 0.82 09/12/2020   GFRNONAA 74 09/12/2020   GFRAA 85 09/12/2020   CALCIUM 9.4 09/12/2020   PHOS 3.3 11/20/2019   PROT 6.9 09/12/2020   ALBUMIN 4.3 09/12/2020   LABGLOB 2.6 09/12/2020   AGRATIO 1.7 09/12/2020   BILITOT 0.5 09/12/2020   ALKPHOS 110 09/12/2020   AST 37 09/12/2020   ALT 41 (H) 09/12/2020   ANIONGAP 5 09/20/2016      Objective    BP (!) 148/77 (BP Location: Left Wrist, Patient Position: Sitting, Cuff Size: Large)   Pulse 75   Temp 99.3 F (37.4 C) (Oral)   Resp 16   Wt 288 lb 3.2 oz (130.7 kg)   BMI 46.52 kg/m  BP Readings from Last 3 Encounters:  09/22/20 (!) 148/77  09/12/20 (!) 148/68  07/15/20 (!)  142/88   Wt Readings from Last 3 Encounters:  09/22/20 288 lb 3.2 oz (130.7 kg)  09/12/20 286 lb (129.7 kg)  07/15/20 284 lb (128.8 kg)      Physical Exam Vitals reviewed.  Constitutional:      General: She is not in acute distress.    Appearance: Normal appearance. She is well-developed. She is obese. She is not ill-appearing or diaphoretic.  Cardiovascular:     Rate and Rhythm: Normal rate and regular rhythm.     Heart sounds: Normal heart sounds. No murmur heard.  No friction  rub. No gallop.   Pulmonary:     Effort: Pulmonary effort is normal. No respiratory distress.     Breath sounds: Normal breath sounds. No wheezing or rales.  Abdominal:     General: Abdomen is flat. Bowel sounds are normal. There is no distension.     Palpations: Abdomen is soft. There is no mass.     Tenderness: There is no abdominal tenderness. There is no guarding or rebound.  Skin:    General: Skin is warm and dry.  Neurological:     Mental Status: She is alert and oriented to person, place, and time.      Results for orders placed or performed in visit on 09/22/20  POCT urinalysis dipstick  Result Value Ref Range   Color, UA Yellow    Clarity, UA Clear    Glucose, UA Negative Negative   Bilirubin, UA negative    Ketones, UA Negative    Spec Grav, UA 1.010 1.010 - 1.025   Blood, UA Negative    pH, UA 6.5 5.0 - 8.0   Protein, UA Negative Negative   Urobilinogen, UA 0.2 0.2 or 1.0 E.U./dL   Nitrite, UA Negative    Leukocytes, UA Small (1+) (A) Negative   Appearance     Odor      Assessment & Plan     1. Urinary tract infection without hematuria, site unspecified Symptoms did not resolve despite 10 days Bactrim. No systemic symptoms. UA still positive. Will treat empirically with Macrobid as below.  Continue to push fluids. Urine sent for culture. Will follow up pending C&S results. She is to call if symptoms do not improve or if they worsen.  - Urine Culture - nitrofurantoin,  macrocrystal-monohydrate, (MACROBID) 100 MG capsule; Take 1 capsule (100 mg total) by mouth 2 (two) times daily.  Dispense: 14 capsule; Refill: 0  2. Burning with urination See above medical treatment plan. - Urine Culture  3. Candidiasis, intertrigo Stable. Diagnosis pulled for medication refill. Continue current medical treatment plan. - ketoconazole (NIZORAL) 2 % cream; Apply 1 application topically daily.  Dispense: 50 g; Refill: 3 - nystatin (MYCOSTATIN/NYSTOP) powder; Apply 1 application topically 3 (three) times daily.  Dispense: 15 g; Refill: 0     No follow-ups on file.      Reynolds Bowl, PA-C, have reviewed all documentation for this visit. The documentation on 09/22/20 for the exam, diagnosis, procedures, and orders are all accurate and complete.   Rubye Beach  [facility] [phone] (phone) [phone] (fax)  [place]

## 2020-09-22 NOTE — Patient Instructions (Signed)

## 2020-09-22 NOTE — Telephone Encounter (Signed)
Patient is calling to let Sharyn Lull know she still has UTI symptoms, just the burning. Patient is requesting more antibiotic. Please send to [facility]. Cb- [phone]

## 2020-09-23 NOTE — Telephone Encounter (Signed)
Patient states that she was seen by Kaylee Reyes in office yesterday and urine culture was sent out. KW

## 2020-09-23 NOTE — Telephone Encounter (Signed)
Noted thanks °

## 2020-09-24 ENCOUNTER — Telehealth: Payer: Self-pay | Admitting: Family Medicine

## 2020-09-24 ENCOUNTER — Telehealth: Payer: Self-pay

## 2020-09-24 DIAGNOSIS — B379 Candidiasis, unspecified: Secondary | ICD-10-CM

## 2020-09-24 DIAGNOSIS — T3695XA Adverse effect of unspecified systemic antibiotic, initial encounter: Secondary | ICD-10-CM

## 2020-09-24 LAB — URINE CULTURE

## 2020-09-24 MED ORDER — FLUCONAZOLE 150 MG PO TABS: 150.0000 mg | ORAL_TABLET | Freq: Once | ORAL | 0 refills | Status: AC

## 2020-09-24 NOTE — Telephone Encounter (Signed)
Written by Mar Daring, PA-C on 09/24/2020 7:29 AM EDT Seen by patient Kaylee Reyes on 09/24/2020 8:06 AM

## 2020-09-24 NOTE — Telephone Encounter (Signed)
Pt called to see if jennifer can call in the Rx for the Yeast infection before she takes the [place] since the test came back negative for UTI / Pt asked for it to be sent to  [facility], [address] Phone:  [phone]  Fax:  [phone]

## 2020-09-24 NOTE — Telephone Encounter (Signed)
-----   Message from Mar Daring, [place] sent at 09/24/2020  7:29 AM EDT ----- Urine culture was actually negative this time. If symptoms are improving with macrobid it is ok to continue. If you have not noticed much change in symptoms with the macrobid it is possible the burning may be more due to external irritation/possible yeast infection from the previous antibiotic. If macrobid is not helping would stop and I can send in medication for possible yeast infection.

## 2020-09-24 NOTE — Telephone Encounter (Signed)
Diflucan sent to [facility]

## 2020-10-07 ENCOUNTER — Other Ambulatory Visit: Payer: Self-pay | Admitting: *Deleted

## 2020-10-07 MED ORDER — VITAMIN D (ERGOCALCIFEROL) 1.25 MG (50000 UNIT) PO CAPS
ORAL_CAPSULE | ORAL | 0 refills | Status: DC
Start: 2020-10-07 — End: 2020-12-23

## 2020-11-10 ENCOUNTER — Other Ambulatory Visit: Payer: Self-pay | Admitting: Family Medicine

## 2020-11-14 ENCOUNTER — Other Ambulatory Visit: Payer: Self-pay | Admitting: Family Medicine

## 2020-11-17 ENCOUNTER — Ambulatory Visit: Payer: Medicare Other | Admitting: Family Medicine

## 2020-11-25 ENCOUNTER — Telehealth: Payer: Self-pay | Admitting: Family Medicine

## 2020-11-25 NOTE — Telephone Encounter (Signed)
I called and spoke with pharmacist. He wanted to verify which PPI patient is supposed to be taking. Pharmacist states that they recently  received a prescription for Lansoprazole, but patient had a prescription filled a few months ago at a retail pharmacy for Pantoprazole.  I advised pharmacist Pantoprazole was discontinued and  patient should be taking Lansoprazole.

## 2020-11-25 NOTE — Telephone Encounter (Signed)
Kaylee Reyes calling from [place] calling for clarification of e-script for  Pantoprazole 30mg  [id number] Reference [phone]

## 2020-11-28 ENCOUNTER — Other Ambulatory Visit: Payer: Self-pay | Admitting: Family Medicine

## 2020-12-04 ENCOUNTER — Telehealth (INDEPENDENT_AMBULATORY_CARE_PROVIDER_SITE_OTHER): Payer: Medicare Other | Admitting: Adult Health

## 2020-12-04 ENCOUNTER — Encounter: Payer: Self-pay | Admitting: Adult Health

## 2020-12-04 DIAGNOSIS — J40 Bronchitis, not specified as acute or chronic: Secondary | ICD-10-CM | POA: Diagnosis not present

## 2020-12-04 DIAGNOSIS — J329 Chronic sinusitis, unspecified: Secondary | ICD-10-CM

## 2020-12-04 MED ORDER — AZITHROMYCIN 250 MG PO TABS
ORAL_TABLET | ORAL | 0 refills | Status: AC
Start: 2020-12-04 — End: 2020-12-09

## 2020-12-04 MED ORDER — SALINE SPRAY 0.65 % NA SOLN: 1.0000 | NASAL | 0 refills | Status: DC | PRN

## 2020-12-04 NOTE — Patient Instructions (Signed)
Sinusitis, Adult Sinusitis is soreness and swelling (inflammation) of your sinuses. Sinuses are hollow spaces in the bones around your face. They are located:  Around your eyes.  In the middle of your forehead.  Behind your nose.  In your cheekbones. Your sinuses and nasal passages are lined with a fluid called mucus. Mucus drains out of your sinuses. Swelling can trap mucus in your sinuses. This lets germs (bacteria, virus, or fungus) grow, which leads to infection. Most of the time, this condition is caused by a virus. What are the causes? This condition is caused by:  Allergies.  Asthma.  Germs.  Things that block your nose or sinuses.  Growths in the nose (nasal polyps).  Chemicals or irritants in the air.  Fungus (rare). What increases the risk? You are more likely to develop this condition if:  You have a weak body defense system (immune system).  You do a lot of swimming or diving.  You use nasal sprays too much.  You smoke. What are the signs or symptoms? The main symptoms of this condition are pain and a feeling of pressure around the sinuses. Other symptoms include:  Stuffy nose (congestion).  Runny nose (drainage).  Swelling and warmth in the sinuses.  Headache.  Toothache.  A cough that may get worse at night.  Mucus that collects in the throat or the back of the nose (postnasal drip).  Being unable to smell and taste.  Being very tired (fatigue).  A fever.  Sore throat.  Bad breath. How is this diagnosed? This condition is diagnosed based on:  Your symptoms.  Your medical history.  A physical exam.  Tests to find out if your condition is short-term (acute) or long-term (chronic). Your doctor may: ? Check your nose for growths (polyps). ? Check your sinuses using a tool that has a light (endoscope). ? Check for allergies or germs. ? Do imaging tests, such as an MRI or CT scan. How is this treated? Treatment for this condition  depends on the cause and whether it is short-term or long-term.  If caused by a virus, your symptoms should go away on their own within 10 days. You may be given medicines to relieve symptoms. They include: ? Medicines that shrink swollen tissue in the nose. ? Medicines that treat allergies (antihistamines). ? A spray that treats swelling of the nostrils. ? Rinses that help get rid of thick mucus in your nose (nasal saline washes).  If caused by bacteria, your doctor may wait to see if you will get better without treatment. You may be given antibiotic medicine if you have: ? A very bad infection. ? A weak body defense system.  If caused by growths in the nose, you may need to have surgery. Follow these instructions at home: Medicines  Take, use, or apply over-the-counter and prescription medicines only as told by your doctor. These may include nasal sprays.  If you were prescribed an antibiotic medicine, take it as told by your doctor. Do not stop taking the antibiotic even if you start to feel better. Hydrate and humidify   Drink enough water to keep your pee (urine) pale yellow.  Use a cool mist humidifier to keep the humidity level in your home above 50%.  Breathe in steam for 10-15 minutes, 3-4 times a day, or as told by your doctor. You can do this in the bathroom while a hot shower is running.  Try not to spend time in cool or dry air.   Rest  Rest as much as you can.  Sleep with your head raised (elevated).  Make sure you get enough sleep each night. General instructions   Put a warm, moist washcloth on your face 3-4 times a day, or as often as told by your doctor. This will help with discomfort.  Wash your hands often with soap and water. If there is no soap and water, use hand sanitizer.  Do not smoke. Avoid being around people who are smoking (secondhand smoke).  Keep all follow-up visits as told by your doctor. This is important. Contact a doctor if:  You  have a fever.  Your symptoms get worse.  Your symptoms do not get better within 10 days. Get help right away if:  You have a very bad headache.  You cannot stop throwing up (vomiting).  You have very bad pain or swelling around your face or eyes.  You have trouble seeing.  You feel confused.  Your neck is stiff.  You have trouble breathing. Summary  Sinusitis is swelling of your sinuses. Sinuses are hollow spaces in the bones around your face.  This condition is caused by tissues in your nose that become inflamed or swollen. This traps germs. These can lead to infection.  If you were prescribed an antibiotic medicine, take it as told by your doctor. Do not stop taking it even if you start to feel better.  Keep all follow-up visits as told by your doctor. This is important. This information is not intended to replace advice given to you by your health care provider. Make sure you discuss any questions you have with your health care provider. Document Revised: 04/24/2018 Document Reviewed: 04/24/2018 Elsevier Patient Education  2020 [employer] Inc.  Upper Respiratory Infection, Adult An upper respiratory infection (URI) affects the nose, throat, and upper air passages. URIs are caused by germs (viruses). The most common type of URI is often called "the common cold." Medicines cannot cure URIs, but you can do things at home to relieve your symptoms. URIs usually get better within 7-10 days. Follow these instructions at home: Activity  Rest as needed.  If you have a fever, stay home from work or school until your fever is gone, or until your doctor says you may return to work or school. ? You should stay home until you cannot spread the infection anymore (you are not contagious). ? Your doctor may have you wear a face mask so you have less risk of spreading the infection. Relieving symptoms  Gargle with a salt-water mixture 3-4 times a day or as needed. To make a salt-water  mixture, completely dissolve -1 tsp of salt in 1 cup of warm water.  Use a cool-mist humidifier to add moisture to the air. This can help you breathe more easily. Eating and drinking   Drink enough fluid to keep your pee (urine) pale yellow.  Eat soups and other clear broths. General instructions   Take over-the-counter and prescription medicines only as told by your doctor. These include cold medicines, fever reducers, and cough suppressants.  Do not use any products that contain nicotine or tobacco. These include cigarettes and e-cigarettes. If you need help quitting, ask your doctor.  Avoid being where people are smoking (avoid secondhand smoke).  Make sure you get regular shots and get the flu shot every year.  Keep all follow-up visits as told by your doctor. This is important. How to avoid spreading infection to others   Wash your hands often  with soap and water. If you do not have soap and water, use hand sanitizer.  Avoid touching your mouth, face, eyes, or nose.  Cough or sneeze into a tissue or your sleeve or elbow. Do not cough or sneeze into your hand or into the air. Contact a doctor if:  You are getting worse, not better.  You have any of these: ? A fever. ? Chills. ? Brown or red mucus in your nose. ? Yellow or brown fluid (discharge)coming from your nose. ? Pain in your face, especially when you bend forward. ? Swollen neck glands. ? Pain with swallowing. ? White areas in the back of your throat. Get help right away if:  You have shortness of breath that gets worse.  You have very bad or constant: ? Headache. ? Ear pain. ? Pain in your forehead, behind your eyes, and over your cheekbones (sinus pain). ? Chest pain.  You have long-lasting (chronic) lung disease along with any of these: ? Wheezing. ? Long-lasting cough. ? Coughing up blood. ? A change in your usual mucus.  You have a stiff neck.  You have changes in  your: ? Vision. ? Hearing. ? Thinking. ? Mood. Summary  An upper respiratory infection (URI) is caused by a germ called a virus. The most common type of URI is often called "the common cold."  URIs usually get better within 7-10 days.  Take over-the-counter and prescription medicines only as told by your doctor. This information is not intended to replace advice given to you by your health care provider. Make sure you discuss any questions you have with your health care provider. Document Revised: 11/30/2018 Document Reviewed: 07/15/2017 Elsevier Patient Education  [place].

## 2020-12-04 NOTE — Progress Notes (Signed)
MyChart Video Visit    Virtual Visit via Video Note   This visit type was conducted due to national recommendations for restrictions regarding the COVID-19 Pandemic (e.g. social distancing) in an effort to limit this patient's exposure and mitigate transmission in our community. This patient is at least at moderate risk for complications without adequate follow up. This format is felt to be most appropriate for this patient at this time. Physical exam was limited by quality of the video and audio technology used for the visit.  Parties involved in visit as below:    Patient location: at home  Provider location: Provider: Provider's office at  [facility], [place].     I discussed the limitations of evaluation and management by telemedicine and the availability of in person appointments. The patient expressed understanding and agreed to proceed.  Patient: Kaylee Reyes   DOB: 1951-04-10   69 y.o. Female  MRN: [id number] Visit Date: 12/04/2020  Today's healthcare provider: Jairo Ben, FNP   Chief Complaint  Patient presents with  . Sinus Problem   Subjective    Sinus Problem This is a new problem. The current episode started 1 to 4 weeks ago. The problem is unchanged. There has been no fever (temperature 98.3 ). Associated symptoms include congestion, coughing, sinus pressure, a sore throat (patient describes throat as scratchy) and swollen glands. Pertinent negatives include no chills, diaphoresis, ear pain, headaches, hoarse voice, neck pain, shortness of breath or sneezing. Past treatments include oral decongestants (lemon/honey drink and otc Ibuprofen). The treatment provided no relief.    Sinus pressure around eyes and generalized dental pain.  Post nasal drip cough. Denies any chest congestion, or bronchial congestion.  Patient  denies any fever, body aches,chills, rash, chest pain, shortness of breath, nausea, vomiting, or diarrhea.     Patient Active Problem List   Diagnosis Date Noted  . Bronchitis 12/04/2020  . Sinusitis 12/04/2020  . Urinary tract infection without hematuria 09/12/2020  . Dysuria 09/12/2020  . Vaginal yeast infection- external  09/12/2020  . Intractable vomiting   . Morbid obesity (HCC) 08/06/2019  . Menopause 03/23/2017  . Rectocele 03/23/2017  . Cystocele, midline 03/23/2017  . Vaginal atrophy 03/23/2017  . Left breast cancer with T3 tumor, >5 cm in greatest dimension (HCC)   . S/P bilateral mastectomy 10/28/2015  . Fatty infiltration of liver 10/01/2015  . Gonalgia 10/01/2015  . Body tinea 10/01/2015  . Invasive lobular carcinoma of right breast, stage 2 (HCC) 10/01/2015  . Hypothyroidism 09/25/2015  . Adult BMI 35.0-35.9 kg/sq m 09/25/2015  . Hypercholesterolemia 09/25/2015  . Hypertension 09/25/2015  . Allergic rhinitis 09/25/2015  . GERD (gastroesophageal reflux disease) 09/25/2015  . Alopecia 09/25/2015  . History of peptic ulcer disease 09/25/2015  . Eczema 09/25/2015   Past Medical History:  Diagnosis Date  . Anemia   . Cancer (HCC) 09/2015   breast cancer  . GERD (gastroesophageal reflux disease)   . History of chicken pox   . Hyperlipidemia   . Hypertension   . Hypothyroidism   . Neuropathy of foot    bilateral  . Pinched nerve   . PONV (postoperative nausea and vomiting)    expl[facility] surgery at [facility]  . Thyroid disease   . Tuberculosis exposure    Mother had TB.  Pt shows (+) on tests, but has never had.  . Vertigo    last episode over 1 yr ago  . Wears dentures    partial  upper and lower   Allergies  Allergen Reactions  . Penicillins Shortness Of Breath    Has patient had a PCN reaction causing immediate rash, facial/tongue/throat swelling, SOB or lightheadedness with hypotension: Yes Has patient had a PCN reaction causing severe rash involving mucus membranes or skin necrosis: No Has patient had a PCN reaction that required hospitalization No Has  patient had a PCN reaction occurring within the last 10 years: No If all of the above answers are "NO", then may proceed with Cephalosporin use.   . Nickel Hives  . Pravastatin Sodium Other (See Comments)    Myalgia       Medications: Outpatient Medications Prior to Visit  Medication Sig  . albuterol (VENTOLIN HFA) 108 (90 Base) MCG/ACT inhaler INHALE 2 PUFFS BY MOUTH EVERY 6 HOURS AS NEEDED WHEEZING OR SHORTNESS OF BREATH  . atenolol (TENORMIN) 50 MG tablet TAKE 1 TABLET DAILY  . B Complex-C (SUPER B COMPLEX PO) Take 1 tablet by mouth daily.  Marland Kitchen CALCIUM CITRATE PO Take 1 tablet by mouth daily.  . Carboxymeth-Glycerin-Polysorb (REFRESH OPTIVE MEGA-3 OP) Place 1 drop into both eyes daily.  Marland Kitchen docusate sodium (COLACE) 100 MG capsule Take 200 mg by mouth daily.   Marland Kitchen ELDERBERRY PO Take by mouth daily.   . fexofenadine (ALLEGRA) 180 MG tablet Take 180 mg by mouth daily.  . hydrochlorothiazide (HYDRODIURIL) 25 MG tablet TAKE 1 TABLET DAILY  . ibuprofen (ADVIL,MOTRIN) 200 MG tablet Take 400 mg by mouth every 6 (six) hours as needed for mild pain.   Marland Kitchen ketoconazole (NIZORAL) 2 % cream Apply 1 application topically daily.  . lansoprazole (PREVACID) 30 MG capsule TAKE 1 CAPSULE DAILY AT 12 NOON  . letrozole (FEMARA) 2.5 MG tablet TAKE 1 TABLET BY MOUTH EVERY DAY  . levothyroxine (SYNTHROID) 75 MCG tablet TAKE 1 TABLET DAILY  . magic mouthwash SOLN 1 part 2% viscous lidocaine: 1 part Maalox: 1 part diphenhydramine 12.5 mg per 5 ml elixir. Rinse and spit 5 ml every six hour as needed  . Magnesium 400 MG TABS Take 600 mg by mouth daily.   . meclizine (ANTIVERT) 12.5 MG tablet Take 1 tablet (12.5 mg total) by mouth 3 (three) times daily as needed for dizziness.  . Misc Natural Products (TURMERIC CURCUMIN) CAPS Take 1 capsule by mouth daily.  . nitrofurantoin, macrocrystal-monohydrate, (MACROBID) 100 MG capsule Take 1 capsule (100 mg total) by mouth 2 (two) times daily.  Marland Kitchen nystatin (MYCOSTATIN/NYSTOP)  powder Apply 1 application topically 3 (three) times daily.  . Olopatadine HCl (PATADAY OP) Apply to eye.   . pantoprazole (PROTONIX) 40 MG tablet Take 40 mg by mouth daily.  Marland Kitchen triamcinolone cream (KENALOG) 0.5 % Apply 1 application topically 2 (two) times daily as needed. For itching  . Vitamin D, Ergocalciferol, (DRISDOL) 1.25 MG (50000 UNIT) CAPS capsule TAKE 1 CAPSULE BY MOUTH 1 TIME A WEEK  . White Petrolatum-Mineral Oil (EYE LUBRICANT) OINT Apply to eye. Hylo Night as needed for eyes at night.   No facility-administered medications prior to visit.    Review of Systems  Constitutional: Negative.  Negative for chills and diaphoresis.  HENT: Positive for congestion, postnasal drip, rhinorrhea, sinus pressure, sinus pain and sore throat (patient describes throat as scratchy). Negative for dental problem, drooling, ear discharge, ear pain, facial swelling, hearing loss, hoarse voice, mouth sores, nosebleeds and sneezing.   Eyes: Positive for pain (patient describes as pressure ).  Respiratory: Positive for cough. Negative for shortness of breath.  Gastrointestinal: Negative.   Genitourinary: Negative.   Musculoskeletal: Negative.  Negative for neck pain.  Skin: Negative.   Allergic/Immunologic: Positive for immunocompromised state.  Neurological: Negative.  Negative for headaches.  Psychiatric/Behavioral: Negative.       Objective    There were no vitals taken for this visit.  No vitals avalible other than those documented or in HPI.  Physical Exam   Patient is alert and oriented and responsive to questions Engages in conversation with provider. Speaks in full sentences without any pauses without any shortness of breath or distress.    Assessment & Plan      Sinusitis, unspecified chronicity, unspecified location - Plan: azithromycin (ZITHROMAX) 250 MG tablet, CANCELED: COVID-19, Flu A+B and RSV  Bronchitis - Plan: azithromycin (ZITHROMAX) 250 MG tablet  Patient declined  need for testing advised, she has not left her house she reports and does not want to go out.   Meds ordered this encounter  Medications  . azithromycin (ZITHROMAX) 250 MG tablet    Sig: 2 by mouth today, then 1 daily for 4 days    Dispense:  6 tablet    Refill:  0  . sodium chloride (OCEAN) 0.65 % SOLN nasal spray    Sig: Place 1 spray into both nostrils as needed for congestion.    Dispense:  104 mL    Refill:  0   No orders of the defined types were placed in this encounter. testing was ordered for covid, flu, rsv for this afternoon in parking lot, patient declined.    Red Flags discussed. The patient was given clear instructions to go to ER or return to medical center if any red flags develop, symptoms do not improve, worsen or new problems develop. They verbalized understanding.  Return in about 5 days (around 12/09/2020), or if symptoms worsen or fail to improve, for at any time for any worsening symptoms, Go to Emergency room/ urgent care if worse.     I discussed the assessment and treatment plan with the patient. The patient was provided an opportunity to ask questions and all were answered. The patient agreed with the plan and demonstrated an understanding of the instructions.   The patient was advised to call back or seek an in-person evaluation if the symptoms worsen or if the condition fails to improve as anticipated.  The entirety of the information documented in the History of Present Illness, Review of Systems and Physical Exam were personally obtained by me. Portions of this information were initially documented by the CMA and reviewed by me for thoroughness and accuracy.    Marcille Buffy, [place] [phone] (phone) [phone] (fax)  [place]

## 2020-12-08 ENCOUNTER — Other Ambulatory Visit: Payer: Self-pay | Admitting: Family Medicine

## 2020-12-11 ENCOUNTER — Telehealth: Payer: Self-pay | Admitting: Family Medicine

## 2020-12-11 DIAGNOSIS — R059 Cough, unspecified: Secondary | ICD-10-CM

## 2020-12-11 NOTE — Telephone Encounter (Signed)
Patient is calling to check on the status of the request for nagging Cough meds. Patient did see Marcelino Duster for her a virtual apt on 12/04/20.   Please advise CB- [phone]

## 2020-12-11 NOTE — Telephone Encounter (Signed)
Pt saw Marcelino Duster for virtual visit on 12/30 and was told if she needs anything else to reach back out. Pt states she still has a terrible cough and she has coughed so much she is sore. Pt states no other symptoms, just the bad ongoing cough.  Pt has tried OTC with little success. Pt requesting cough med to help with her sx.  [id number] - [place], [place] - [address] AT [facility] OF [address] [facility]

## 2020-12-12 NOTE — Telephone Encounter (Signed)
Patient was seen by you on 12/04/20 for a video visit for sinusitis. Did you discuss prescription for cough medicine?

## 2020-12-15 MED ORDER — HYDROCOD POLST-CPM POLST ER 10-8 MG/5ML PO SUER: 5.0000 mL | Freq: Two times a day (BID) | ORAL | 0 refills | Status: DC | PRN

## 2020-12-15 NOTE — Telephone Encounter (Signed)
Patient has been advised. KW 

## 2020-12-15 NOTE — Telephone Encounter (Signed)
Have sent prescription Tussionex to [facility]

## 2020-12-15 NOTE — Telephone Encounter (Signed)
Patient is calling again to check if the doctor sent in a script for her constant cough.  She still has not heard from the nurse or doctor and would really like some medication for her cough.  CB# [phone]

## 2020-12-15 NOTE — Telephone Encounter (Signed)
Patient was seen by video visit by Kaylee Reyes on 12/04/20 and treated for sinusitis. Patient states that she continues to have a constant cough that she states is productive of sputum. Patient states that she has tried taking otc Robitussin Cough & Congestin DM and Dayquil with no relief. Patient states that she uses [facility] in [place]. Please review and advise. KW

## 2020-12-23 ENCOUNTER — Other Ambulatory Visit: Payer: Self-pay | Admitting: *Deleted

## 2020-12-23 MED ORDER — VITAMIN D (ERGOCALCIFEROL) 1.25 MG (50000 UNIT) PO CAPS: ORAL_CAPSULE | ORAL | 1 refills | Status: DC

## 2021-01-02 ENCOUNTER — Other Ambulatory Visit: Payer: Self-pay | Admitting: Family Medicine

## 2021-01-02 MED ORDER — MECLIZINE HCL 12.5 MG PO TABS: 12.5000 mg | ORAL_TABLET | Freq: Three times a day (TID) | ORAL | 1 refills | Status: DC | PRN

## 2021-01-02 NOTE — Telephone Encounter (Signed)
Non delegated refill for Antivert.

## 2021-01-02 NOTE — Telephone Encounter (Signed)
Medication Refill - Medication:   meclizine (ANTIVERT) 12.5 MG tablet     Preferred Pharmacy (with phone number or street name):  [id number] [address], [place] Phone:  [phone]  Fax:  [phone]       Agent: Please be advised that RX refills may take up to 3 business days. We ask that you follow-up with your pharmacy.

## 2021-01-12 NOTE — Progress Notes (Signed)
[place]  [phone] [phone]  ID: Kaylee Reyes OB: 06-25-1951  MR#: [id number]  DDU#:[id number]  Patient Care Team: Birdie Sons, MD as PCP - General (Family Medicine) [name], MD as Consulting Physician (Gastroenterology) Diamond Nickel, DO as Consulting Physician (Sports Medicine) Sharlet Salina, MD as Referring Physician (Physical Medicine and Rehabilitation) Olean Ree, MD as Consulting Physician (General Surgery) Lloyd Huger, MD as Consulting Physician (Oncology) Anell Barr, OD (Optometry) Rubie Maid, MD as Referring Physician (Obstetrics and Gynecology)  I connected with Kaylee Reyes on 01/15/21 at  2:30 PM EST by video enabled telemedicine visit and verified that I am speaking with the correct person using two identifiers.   I discussed the limitations, risks, security and privacy concerns of performing an evaluation and management service by telemedicine and the availability of in-person appointments. I also discussed with the patient that there may be a patient responsible charge related to this service. The patient expressed understanding and agreed to proceed.   Other persons participating in the visit and their role in the encounter: Patient, MD.  Patient's location: Home. Provider's location: Clinic.  CHIEF COMPLAINT: Pathologic stage IIB ER/PR positive invasive lobular carcinoma of the right upper breast.  INTERVAL HISTORY: Patient agreed to video enabled telemedicine visit for her routine 20-month evaluation.  She continues to tolerate letrozole well without significant side effects.  She currently feels well and is asymptomatic. She has no neurologic complaints.  She denies any recent fevers or illnesses.  She denies any chest pain, shortness of breath, cough, or hemoptysis.  She has no nausea, vomiting, constipation, or diarrhea. She has no urinary complaints.  Patient feels at her  baseline and offers no specific complaints today.  REVIEW OF SYSTEMS:   Review of Systems  Constitutional: Negative.  Negative for fever, malaise/fatigue and weight loss.  Respiratory: Negative.  Negative for cough and shortness of breath.   Cardiovascular: Negative.  Negative for chest pain and leg swelling.  Gastrointestinal: Negative.  Negative for abdominal pain and constipation.  Genitourinary: Negative.  Negative for dysuria.  Musculoskeletal: Negative.  Negative for back pain and joint pain.  Skin: Negative.  Negative for rash.  Neurological: Negative.  Negative for dizziness, sensory change, focal weakness, weakness and headaches.  Psychiatric/Behavioral: Negative.  Negative for memory loss. The patient is not nervous/anxious.     As per HPI. Otherwise, a complete review of systems is negative.  PAST MEDICAL HISTORY: Past Medical History:  Diagnosis Date  . Anemia   . Cancer ([place]) 09/2015   breast cancer  . GERD (gastroesophageal reflux disease)   . History of chicken pox   . Hyperlipidemia   . Hypertension   . Hypothyroidism   . Neuropathy of foot    bilateral  . Pinched nerve   . PONV (postoperative nausea and vomiting)    exploratory surgery at [facility]  . Thyroid disease   . Tuberculosis exposure    Mother had TB.  Pt shows (+) on tests, but has never had.  . Vertigo    last episode over 1 yr ago  . Wears dentures    partial upper and lower    PAST SURGICAL HISTORY: Past Surgical History:  Procedure Laterality Date  . ABDOMINAL HYSTERECTOMY  07/13/2013   Total. with BSO for postmenopausal bleeding and fibroids with large cervical polyp. Dr. Levy Sjogren and Dr. Glennon Mac at [facility]  . ABDOMINAL ULTRASOUND  04/17/2011   Hepatomegaly with borderline slenomegaly. Suggestive of fatty  liver. s/p cholecystectomy. Portions of aorta obscured  . BREAST SURGERY Right 2011   BREAST BIOPSY  . CESAREAN SECTION  1988  . CHOLECYSTECTOMY  1990  . COLONOSCOPY WITH PROPOFOL N/A  09/01/2017   Procedure: COLONOSCOPY WITH PROPOFOL;  Surgeon: [name], MD;  Location: [facility];  Service: Gastroenterology;  Laterality: N/A;  . DIAGNOSTIC LAPAROSCOPY    . ESOPHAGOGASTRODUODENOSCOPY (EGD) WITH PROPOFOL N/A 01/17/2020   Procedure: ESOPHAGOGASTRODUODENOSCOPY (EGD) WITH PROPOFOL;  Surgeon: [name], MD;  Location: [facility] END[facility]COPY;  Service: Endoscopy;  Laterality: N/A;  . EXPLORATORY LAPAROTOMY     Left breast  . MASTECTOMY    . MASTECTOMY W/ SENTINEL NODE BIOPSY Bilateral 10/28/2015   Procedure: MASTECTOMY WITH SENTINEL LYMPH NODE BIOPSY;  Surgeon: Hubbard Robinson, MD;  Location: [facility];  Service: General;  Laterality: Bilateral;  . PORT-A-CATH REMOVAL Left 10/11/2016   Procedure: REMOVAL PORT-A-CATH;  Surgeon: Hubbard Robinson, MD;  Location: [facility];  Service: General;  Laterality: Left;  . PORTACATH PLACEMENT Left 12/23/2015   Procedure: INSERTION PORT-A-CATH;  Surgeon: Hubbard Robinson, MD;  Location: [facility];  Service: General;  Laterality: Left;  . WRIST SURGERY      FAMILY HISTORY Family History  Problem Relation Age of Onset  . Hypertension Mother   . Thyroid disease Mother   . Hypertension Brother   . Diabetes Brother   . Breast cancer Neg Hx   . Ovarian cancer Neg Hx   . Colon cancer Neg Hx        ADVANCED DIRECTIVES:    HEALTH MAINTENANCE: Social History   Tobacco Use  . Smoking status: Never Smoker  . Smokeless tobacco: Never Used  Vaping Use  . Vaping Use: Never used  Substance Use Topics  . Alcohol use: No    Alcohol/week: 0.0 standard drinks  . Drug use: No     Colonoscopy:  PAP:  Bone density:  Lipid panel:  Allergies  Allergen Reactions  . Penicillins Shortness Of Breath    Has patient had a PCN reaction causing immediate rash, facial/tongue/throat swelling, SOB or lightheadedness with hypotension: Yes Has patient had a PCN reaction causing severe rash involving mucus membranes or skin necrosis: No Has  patient had a PCN reaction that required hospitalization No Has patient had a PCN reaction occurring within the last 10 years: No If all of the above answers are "NO", then may proceed with Cephalosporin use.   . Nickel Hives  . Pravastatin Sodium Other (See Comments)    Myalgia     Current Outpatient Medications  Medication Sig Dispense Refill  . atenolol (TENORMIN) 50 MG tablet TAKE 1 TABLET DAILY 90 tablet 0  . B Complex-C (SUPER B COMPLEX PO) Take 1 tablet by mouth daily.    Marland Kitchen CALCIUM CITRATE PO Take 1 tablet by mouth daily.    . Carboxymeth-Glycerin-Polysorb (REFRESH OPTIVE MEGA-3 OP) Place 1 drop into both eyes daily.    Marland Kitchen docusate sodium (COLACE) 100 MG capsule Take 200 mg by mouth daily.     Marland Kitchen ELDERBERRY PO Take by mouth daily.     . fexofenadine (ALLEGRA) 180 MG tablet Take 180 mg by mouth daily.    . hydrochlorothiazide (HYDRODIURIL) 25 MG tablet TAKE 1 TABLET DAILY 90 tablet 0  . ibuprofen (ADVIL,MOTRIN) 200 MG tablet Take 400 mg by mouth every 6 (six) hours as needed for mild pain.     Marland Kitchen lansoprazole (PREVACID) 30 MG capsule TAKE 1 CAPSULE DAILY AT 12 NOON 90  capsule 0  . letrozole (FEMARA) 2.5 MG tablet TAKE 1 TABLET BY MOUTH EVERY DAY 90 tablet 1  . levothyroxine (SYNTHROID) 75 MCG tablet TAKE 1 TABLET(75 MCG) BY MOUTH DAILY 90 tablet 0  . magic mouthwash SOLN 1 part 2% viscous lidocaine: 1 part Maalox: 1 part diphenhydramine 12.5 mg per 5 ml elixir. Rinse and spit 5 ml every six hour as needed 60 mL 3  . Magnesium 400 MG TABS Take 600 mg by mouth daily.     . Misc Natural Products (TURMERIC CURCUMIN) CAPS Take 1 capsule by mouth daily.    . Olopatadine HCl (PATADAY OP) Apply to eye.     . sodium chloride (OCEAN) 0.65 % SOLN nasal spray Place 1 spray into both nostrils as needed for congestion. 104 mL 0  . triamcinolone cream (KENALOG) 0.5 % Apply 1 application topically 2 (two) times daily as needed. For itching 30 g 0  . Vitamin D, Ergocalciferol, (DRISDOL) 1.25 MG  (50000 UNIT) CAPS capsule TAKE 1 CAPSULE BY MOUTH 1 TIME A WEEK 12 capsule 1  . White Petrolatum-Mineral Oil (EYE LUBRICANT) OINT Apply to eye. Hylo Night as needed for eyes at night.    Marland Kitchen albuterol (VENTOLIN HFA) 108 (90 Base) MCG/ACT inhaler INHALE 2 PUFFS BY MOUTH EVERY 6 HOURS AS NEEDED WHEEZING OR SHORTNESS OF BREATH (Patient not taking: Reported on 01/13/2021) 18 g 3  . ketoconazole (NIZORAL) 2 % cream Apply 1 application topically daily. (Patient not taking: Reported on 01/13/2021) 50 g 3  . meclizine (ANTIVERT) 12.5 MG tablet Take 1 tablet (12.5 mg total) by mouth 3 (three) times daily as needed for dizziness. (Patient not taking: Reported on 01/13/2021) 90 tablet 1  . nystatin (MYCOSTATIN/NYSTOP) powder Apply 1 application topically 3 (three) times daily. (Patient not taking: Reported on 01/13/2021) 15 g 0   No current facility-administered medications for this visit.    OBJECTIVE: There were no vitals filed for this visit.   There is no height or weight on file to calculate BMI.    ECOG FS:0 - Asymptomatic  General: Well-developed, well-nourished, no acute distress. HEENT: Normocephalic. Neuro: Alert, answering all questions appropriately. Cranial nerves grossly intact. Psych: Normal affect.  LAB RESULTS:  Lab Results  Component Value Date   NA 141 09/12/2020   K 4.0 09/12/2020   CL 99 09/12/2020   CO2 26 09/12/2020   GLUCOSE 94 09/12/2020   BUN 12 09/12/2020   CREATININE 0.82 09/12/2020   CALCIUM 9.4 09/12/2020   PROT 6.9 09/12/2020   ALBUMIN 4.3 09/12/2020   AST 37 09/12/2020   ALT 41 (H) 09/12/2020   ALKPHOS 110 09/12/2020   BILITOT 0.5 09/12/2020   GFRNONAA 74 09/12/2020   GFRAA 85 09/12/2020    Lab Results  Component Value Date   WBC 8.3 09/12/2020   NEUTROABS 6.3 09/12/2020   HGB 11.5 09/12/2020   HCT 34.1 09/12/2020   MCV 87 09/12/2020   PLT 206 09/12/2020   Lab Results  Component Value Date   IRON 48 02/09/2016   TIBC 326 02/09/2016   IRONPCTSAT 15  02/09/2016     STUDIES: No results found.  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  PLAN:    1. Pathologic stage IIB ER/PR positive invasive lobular carcinoma of the right upper breast: Patient is now status post bilateral mastectomy, therefore does not require additional mammograms. She completed 4 cycles of Taxotere and Cytoxan on March 03, 2016.  She  also completed adjuvant XRT.  Continue letrozole for a total of 5 years completing treatment in July 2022.  Patient was instructed to continue treatment until her next clinic visit.  Return to clinic in 6 months with video assisted telemedicine visit.     2.  Bone health: Patient's most recent bone mineral density on July 07, 2020 reported T score of 0.0 which is considered normal.  Continue to monitor every 2 years.  Repeat in July 2023.    I provided 20 minutes of face-to-face video visit time during this encounter which included chart review, counseling, and coordination of care as documented above.   Patient expressed understanding and was in agreement with this plan. She also understands that She can call clinic at any time with any questions, concerns, or complaints.   Invasive lobular carcinoma of right breast, stage 2 ([place])   Staging form: Breast, AJCC 7th Edition     Pathologic stage: Stage IIB (T2, N1a, M0) - Signed by Lloyd Huger, MD on 10/22/2015   Lloyd Huger, MD   01/15/2021 6:12 AM

## 2021-01-13 ENCOUNTER — Inpatient Hospital Stay: Payer: Medicare Other | Attending: Oncology | Admitting: Oncology

## 2021-01-13 ENCOUNTER — Encounter: Payer: Self-pay | Admitting: Oncology

## 2021-01-13 DIAGNOSIS — C50911 Malignant neoplasm of unspecified site of right female breast: Secondary | ICD-10-CM | POA: Diagnosis not present

## 2021-01-13 NOTE — Progress Notes (Unsigned)
Pt called name and date of birth verified.  Pt denies any concerns today.  Instructed MD would be doing my chart video visit at 230 today.

## 2021-01-15 ENCOUNTER — Encounter: Payer: Self-pay | Admitting: Physician Assistant

## 2021-01-15 ENCOUNTER — Other Ambulatory Visit: Payer: Self-pay

## 2021-01-15 ENCOUNTER — Ambulatory Visit (INDEPENDENT_AMBULATORY_CARE_PROVIDER_SITE_OTHER): Payer: Medicare Other | Admitting: Physician Assistant

## 2021-01-15 VITALS — BP 153/94 | HR 73 | Temp 98.7°F | Wt 288.0 lb

## 2021-01-15 DIAGNOSIS — R059 Cough, unspecified: Secondary | ICD-10-CM

## 2021-01-15 DIAGNOSIS — N3090 Cystitis, unspecified without hematuria: Secondary | ICD-10-CM | POA: Diagnosis not present

## 2021-01-15 LAB — POCT URINALYSIS DIPSTICK
Bilirubin, UA: NEGATIVE
Glucose, UA: NEGATIVE
Ketones, UA: NEGATIVE
Nitrite, UA: NEGATIVE
Protein, UA: NEGATIVE
Spec Grav, UA: 1.015 (ref 1.010–1.025)
Urobilinogen, UA: 0.2 E.U./dL
pH, UA: 6 (ref 5.0–8.0)

## 2021-01-15 MED ORDER — SULFAMETHOXAZOLE-TRIMETHOPRIM 800-160 MG PO TABS: 1.0000 | ORAL_TABLET | Freq: Two times a day (BID) | ORAL | 0 refills | Status: DC

## 2021-01-15 NOTE — Progress Notes (Signed)
Established patient visit   Patient: Kaylee Reyes   DOB: [birth year]   70 y.o. Female  MRN: [id number] Visit Date: 01/15/2021  Today's healthcare provider: Mar Daring, PA-C   Chief Complaint  Patient presents with  . Urinary Tract Infection  . Cough   Subjective    Urinary Tract Infection  This is a new problem. The current episode started in the past 7 days (3 days ago). The problem occurs every urination (at the end of the stream). The problem has been gradually worsening. The quality of the pain is described as aching. The patient is experiencing no pain. There has been no fever. She is not sexually active. There is no history of pyelonephritis. Associated symptoms include nausea. Pertinent negatives include no chills, discharge, flank pain, frequency, hematuria, urgency or vomiting. She has tried increased fluids for the symptoms. The treatment provided no relief.  Cough This is a recurrent (started after sinus infection in December) problem. The current episode started more than 1 month ago. The problem has been gradually improving (feels over the last 5 days may be getting a little better). The problem occurs every few hours. The cough is non-productive. Associated symptoms include heartburn (Pt does report having a history of Acid Reflux.) and postnasal drip. Pertinent negatives include no chest pain, chills, ear pain, fever, headaches, hemoptysis, nasal congestion, sore throat, shortness of breath or wheezing. She has tried prescription cough suppressant for the symptoms. The treatment provided no relief.     Patient Active Problem List   Diagnosis Date Noted  . Bronchitis 12/04/2020  . Sinusitis 12/04/2020  . Urinary tract infection without hematuria 09/12/2020  . Dysuria 09/12/2020  . Vaginal yeast infection- external  09/12/2020  . Intractable vomiting   . Morbid obesity ([place]) 08/06/2019  . Menopause 03/23/2017  . Rectocele 03/23/2017  . Cystocele, midline  03/23/2017  . Vaginal atrophy 03/23/2017  . Left breast cancer with T3 tumor, >5 cm in greatest dimension ([place])   . S/P bilateral mastectomy 10/28/2015  . Fatty infiltration of liver 10/01/2015  . Gonalgia 10/01/2015  . Body tinea 10/01/2015  . Invasive lobular carcinoma of right breast, stage 2 ([place]) 10/01/2015  . Hypothyroidism 09/25/2015  . Adult BMI 35.0-35.9 kg/sq m 09/25/2015  . Hypercholesterolemia 09/25/2015  . Hypertension 09/25/2015  . Allergic rhinitis 09/25/2015  . GERD (gastroesophageal reflux disease) 09/25/2015  . Alopecia 09/25/2015  . History of peptic ulcer disease 09/25/2015  . Eczema 09/25/2015   Past Medical History:  Diagnosis Date  . Anemia   . Cancer ([place]) 09/2015   breast cancer  . GERD (gastroesophageal reflux disease)   . History of chicken pox   . Hyperlipidemia   . Hypertension   . Hypothyroidism   . Neuropathy of foot    bilateral  . Pinched nerve   . PONV (postoperative nausea and vomiting)    exploratory surgery at [facility]  . Thyroid disease   . Tuberculosis exposure    Mother had TB.  Pt shows (+) on tests, but has never had.  . Vertigo    last episode over 1 yr ago  . Wears dentures    partial upper and lower   Social History   Tobacco Use  . Smoking status: Never Smoker  . Smokeless tobacco: Never Used  Vaping Use  . Vaping Use: Never used  Substance Use Topics  . Alcohol use: No    Alcohol/week: 0.0 standard drinks  . Drug use: No  Allergies  Allergen Reactions  . Penicillins Shortness Of Breath    Has patient had a PCN reaction causing immediate rash, facial/tongue/throat swelling, SOB or lightheadedness with hypotension: Yes Has patient had a PCN reaction causing severe rash involving mucus membranes or skin necrosis: No Has patient had a PCN reaction that required hospitalization No Has patient had a PCN reaction occurring within the last 10 years: No If all of the above answers are "NO", then may proceed with  Cephalosporin use.   . Nickel Hives  . Pravastatin Sodium Other (See Comments)    Myalgia      Medications: Outpatient Medications Prior to Visit  Medication Sig  . albuterol (VENTOLIN HFA) 108 (90 Base) MCG/ACT inhaler INHALE 2 PUFFS BY MOUTH EVERY 6 HOURS AS NEEDED WHEEZING OR SHORTNESS OF BREATH  . atenolol (TENORMIN) 50 MG tablet TAKE 1 TABLET DAILY  . B Complex-C (SUPER B COMPLEX PO) Take 1 tablet by mouth daily.  Marland Kitchen CALCIUM CITRATE PO Take 1 tablet by mouth daily.  . Carboxymeth-Glycerin-Polysorb (REFRESH OPTIVE MEGA-3 OP) Place 1 drop into both eyes daily.  Marland Kitchen docusate sodium (COLACE) 100 MG capsule Take 200 mg by mouth daily.   Marland Kitchen ELDERBERRY PO Take by mouth daily.   . fexofenadine (ALLEGRA) 180 MG tablet Take 180 mg by mouth daily.  . hydrochlorothiazide (HYDRODIURIL) 25 MG tablet TAKE 1 TABLET DAILY  . ibuprofen (ADVIL,MOTRIN) 200 MG tablet Take 400 mg by mouth every 6 (six) hours as needed for mild pain.   Marland Kitchen ketoconazole (NIZORAL) 2 % cream Apply 1 application topically daily.  . lansoprazole (PREVACID) 30 MG capsule TAKE 1 CAPSULE DAILY AT 12 NOON  . letrozole (FEMARA) 2.5 MG tablet TAKE 1 TABLET BY MOUTH EVERY DAY  . levothyroxine (SYNTHROID) 75 MCG tablet TAKE 1 TABLET(75 MCG) BY MOUTH DAILY  . magic mouthwash SOLN 1 part 2% viscous lidocaine: 1 part Maalox: 1 part diphenhydramine 12.5 mg per 5 ml elixir. Rinse and spit 5 ml every six hour as needed  . Magnesium 400 MG TABS Take 600 mg by mouth daily.   . meclizine (ANTIVERT) 12.5 MG tablet Take 1 tablet (12.5 mg total) by mouth 3 (three) times daily as needed for dizziness.  . Misc Natural Products (TURMERIC CURCUMIN) CAPS Take 1 capsule by mouth daily.  Marland Kitchen nystatin (MYCOSTATIN/NYSTOP) powder Apply 1 application topically 3 (three) times daily.  . sodium chloride (OCEAN) 0.65 % SOLN nasal spray Place 1 spray into both nostrils as needed for congestion.  . triamcinolone cream (KENALOG) 0.5 % Apply 1 application topically 2  (two) times daily as needed. For itching  . Vitamin D, Ergocalciferol, (DRISDOL) 1.25 MG (50000 UNIT) CAPS capsule TAKE 1 CAPSULE BY MOUTH 1 TIME A WEEK  . [DISCONTINUED] Olopatadine HCl (PATADAY OP) Apply to eye.   . [DISCONTINUED] White Petrolatum-Mineral Oil (EYE LUBRICANT) OINT Apply to eye. Hylo Night as needed for eyes at night.   No facility-administered medications prior to visit.    Review of Systems  Constitutional: Negative for chills, fatigue and fever.  HENT: Positive for congestion and postnasal drip. Negative for ear discharge, ear pain, sinus pressure, sinus pain, sore throat and trouble swallowing.   Eyes: Negative.   Respiratory: Positive for cough. Negative for apnea, hemoptysis, choking, chest tightness, shortness of breath, wheezing and stridor.   Cardiovascular: Negative for chest pain.  Gastrointestinal: Positive for heartburn (Pt does report having a history of Acid Reflux.) and nausea. Negative for abdominal distention, abdominal pain, anal bleeding, blood  in stool, constipation, diarrhea, rectal pain and vomiting.  Genitourinary: Positive for dysuria. Negative for decreased urine volume, difficulty urinating, flank pain, frequency, hematuria, pelvic pain, urgency, vaginal bleeding, vaginal discharge and vaginal pain.  Neurological: Negative for dizziness, light-headedness and headaches.      Objective    BP (!) 153/94 (BP Location: Left Wrist, Patient Position: Sitting, Cuff Size: Normal)   Pulse 73   Temp 98.7 F (37.1 C) (Oral)   Wt 288 lb (130.6 kg)   BMI 46.48 kg/m    Physical Exam Vitals reviewed.  Constitutional:      General: She is not in acute distress.    Appearance: Normal appearance. She is well-developed and well-nourished. She is obese. She is not ill-appearing or diaphoretic.  HENT:     Head: Normocephalic and atraumatic.  Cardiovascular:     Rate and Rhythm: Normal rate and regular rhythm.     Heart sounds: Normal heart sounds. No  murmur heard. No friction rub. No gallop.   Pulmonary:     Effort: Pulmonary effort is normal. No respiratory distress.     Breath sounds: Normal breath sounds. No wheezing or rales.  Abdominal:     General: Abdomen is flat. Bowel sounds are normal. There is no distension.     Palpations: Abdomen is soft. There is no hepatosplenomegaly or mass.     Tenderness: There is no abdominal tenderness. There is no CVA tenderness, guarding or rebound.  Musculoskeletal:     Cervical back: Normal range of motion and neck supple.  Skin:    General: Skin is warm and dry.  Neurological:     General: No focal deficit present.     Mental Status: She is alert and oriented to person, place, and time. Mental status is at baseline.  Psychiatric:        Mood and Affect: Mood normal.        Thought Content: Thought content normal.     Results for orders placed or performed in visit on 01/15/21  POCT urinalysis dipstick  Result Value Ref Range   Color, UA     Clarity, UA     Glucose, UA Negative Negative   Bilirubin, UA Negative    Ketones, UA Negative    Spec Grav, UA 1.015 1.010 - 1.025   Blood, UA Trace    pH, UA 6.0 5.0 - 8.0   Protein, UA Negative Negative   Urobilinogen, UA 0.2 0.2 or 1.0 E.U./dL   Nitrite, UA Negative    Leukocytes, UA Trace (A) Negative   Appearance     Odor      Assessment & Plan     1. Cystitis Worsening symptoms. No systemic symptoms. UA positive. Will treat empirically with Bactrim as below.  Continue to push fluids. Urine sent for culture. Will follow up pending C&S results. Microscopic analysis will be obtained as well. She is to call if symptoms do not improve or if they worsen.  - POCT urinalysis dipstick - Urinalysis, microscopic only - CULTURE, URINE COMPREHENSIVE - sulfamethoxazole-trimethoprim (BACTRIM DS) 800-160 MG tablet; Take 1 tablet by mouth 2 (two) times daily.  Dispense: 10 tablet; Refill: 0  2. Cough Persistent. Possible post infectious cough  vs postnasal drainage vs GERD. She has all 3 that can contribute. Continue working on dietary changes, start saline nasal rinses. Continue GERD medication. Call if worsening.    No follow-ups on file.      Reynolds Bowl, PA-C, have reviewed all documentation  for this visit. The documentation on 01/15/21 for the exam, diagnosis, procedures, and orders are all accurate and complete.   Rubye Beach  [facility] [phone] (phone) [phone] (fax)  [place]

## 2021-01-16 ENCOUNTER — Telehealth: Payer: Self-pay | Admitting: Family Medicine

## 2021-01-16 LAB — URINALYSIS, MICROSCOPIC ONLY
Casts: NONE SEEN /lpf
WBC, UA: >30 /hpf — AB (ref 0–5)

## 2021-01-16 NOTE — Telephone Encounter (Signed)
Patient would like the nurse to call her regarding her medication for a UTI.  She stated that she wants to make sure that this is the correct medication she should be taking.  Please advise and call patient to confirm at [phone]

## 2021-01-16 NOTE — Telephone Encounter (Signed)
Patient advised.

## 2021-01-16 NOTE — Telephone Encounter (Signed)
Culture has not resulted yet. She needs to continue for now until that comes back

## 2021-01-19 ENCOUNTER — Telehealth: Payer: Self-pay

## 2021-01-19 LAB — CULTURE, URINE COMPREHENSIVE

## 2021-01-19 MED ORDER — NITROFURANTOIN MONOHYD MACRO 100 MG PO CAPS: 100.0000 mg | ORAL_CAPSULE | Freq: Two times a day (BID) | ORAL | 0 refills | Status: DC

## 2021-01-19 NOTE — Addendum Note (Signed)
Addended by: Mar Daring on: 01/19/2021 02:05 PM   Modules accepted: Orders

## 2021-01-19 NOTE — Telephone Encounter (Signed)
Pt advised of culture results.  She is going to pick up the new RX today.   Thanks,   -Mickel Baas

## 2021-01-19 NOTE — Telephone Encounter (Signed)
See phone message.   Thanks,   -Mickel Baas

## 2021-01-19 NOTE — Telephone Encounter (Signed)
PT called to follow up regarding her UTI, states the medication is not working and wants to discuss further. Please advise.

## 2021-01-19 NOTE — Telephone Encounter (Signed)
Copied from [place] [id number]. Topic: General - Other >> Jan 19, 2021  8:48 AM Kaylee Reyes wrote: Reason for CRM: Pt called stating that the UA she had on 01/15/21 she got the results back in [place]. She states that she would like to speak with a nurse regarding the medication she has been taking since the appt since she states it seems to be combative to the results. Please advise.

## 2021-01-21 ENCOUNTER — Ambulatory Visit (INDEPENDENT_AMBULATORY_CARE_PROVIDER_SITE_OTHER)
Admission: EM | Admit: 2021-01-21 | Discharge: 2021-01-21 | Disposition: A | Discharge status: Home / Self Care | Payer: Medicare Other | Source: Home / Self Care

## 2021-01-21 ENCOUNTER — Encounter: Payer: Self-pay | Admitting: Emergency Medicine

## 2021-01-21 ENCOUNTER — Other Ambulatory Visit: Payer: Self-pay

## 2021-01-21 ENCOUNTER — Emergency Department: Payer: Medicare Other

## 2021-01-21 ENCOUNTER — Ambulatory Visit (INDEPENDENT_AMBULATORY_CARE_PROVIDER_SITE_OTHER): Payer: Medicare Other

## 2021-01-21 ENCOUNTER — Emergency Department
Admission: EM | Admit: 2021-01-21 | Discharge: 2021-01-22 | Disposition: A | Discharge status: Home / Self Care | Payer: Medicare Other | Attending: Emergency Medicine | Admitting: Emergency Medicine

## 2021-01-21 DIAGNOSIS — R5383 Other fatigue: Secondary | ICD-10-CM | POA: Diagnosis not present

## 2021-01-21 DIAGNOSIS — I10 Essential (primary) hypertension: Secondary | ICD-10-CM | POA: Insufficient documentation

## 2021-01-21 DIAGNOSIS — R0602 Shortness of breath: Secondary | ICD-10-CM | POA: Insufficient documentation

## 2021-01-21 DIAGNOSIS — Z20822 Contact with and (suspected) exposure to covid-19: Secondary | ICD-10-CM

## 2021-01-21 DIAGNOSIS — R7989 Other specified abnormal findings of blood chemistry: Secondary | ICD-10-CM | POA: Insufficient documentation

## 2021-01-21 DIAGNOSIS — U071 COVID-19: Secondary | ICD-10-CM | POA: Insufficient documentation

## 2021-01-21 DIAGNOSIS — R059 Cough, unspecified: Secondary | ICD-10-CM | POA: Insufficient documentation

## 2021-01-21 DIAGNOSIS — Z853 Personal history of malignant neoplasm of breast: Secondary | ICD-10-CM | POA: Diagnosis not present

## 2021-01-21 DIAGNOSIS — J0101 Acute recurrent maxillary sinusitis: Secondary | ICD-10-CM | POA: Diagnosis not present

## 2021-01-21 DIAGNOSIS — R9431 Abnormal electrocardiogram [ECG] [EKG]: Secondary | ICD-10-CM | POA: Insufficient documentation

## 2021-01-21 DIAGNOSIS — E876 Hypokalemia: Secondary | ICD-10-CM | POA: Insufficient documentation

## 2021-01-21 DIAGNOSIS — Z79899 Other long term (current) drug therapy: Secondary | ICD-10-CM | POA: Insufficient documentation

## 2021-01-21 DIAGNOSIS — E039 Hypothyroidism, unspecified: Secondary | ICD-10-CM | POA: Insufficient documentation

## 2021-01-21 LAB — CBC WITH DIFFERENTIAL/PLATELET
Abs Immature Granulocytes: 0.02 10*3/uL (ref 0.00–0.07)
Basophils Absolute: 0 10*3/uL (ref 0.0–0.1)
Basophils Relative: 0 %
Eosinophils Absolute: 0 10*3/uL (ref 0.0–0.5)
Eosinophils Relative: 1 %
HCT: 39 % (ref 36.0–46.0)
Hemoglobin: 12.6 g/dL (ref 12.0–15.0)
Immature Granulocytes: 0 %
Lymphocytes Relative: 17 %
Lymphs Abs: 0.8 10*3/uL (ref 0.7–4.0)
MCH: 27.8 pg (ref 26.0–34.0)
MCHC: 32.3 g/dL (ref 30.0–36.0)
MCV: 86.1 fL (ref 80.0–100.0)
Monocytes Absolute: 0.3 10*3/uL (ref 0.1–1.0)
Monocytes Relative: 7 %
Neutro Abs: 3.5 10*3/uL (ref 1.7–7.7)
Neutrophils Relative %: 75 %
Platelets: 155 10*3/uL (ref 150–400)
RBC: 4.53 MIL/uL (ref 3.87–5.11)
RDW: 14.5 % (ref 11.5–15.5)
WBC: 4.6 10*3/uL (ref 4.0–10.5)
nRBC: 0 % (ref 0.0–0.2)

## 2021-01-21 LAB — D-DIMER, QUANTITATIVE: D-Dimer, Quant: 2.27 ug/mL-FEU — ABNORMAL HIGH (ref 0.00–0.50)

## 2021-01-21 LAB — BASIC METABOLIC PANEL
Anion gap: 17 — ABNORMAL HIGH (ref 5–15)
BUN: 15 mg/dL (ref 8–23)
CO2: 19 mmol/L — ABNORMAL LOW (ref 22–32)
Calcium: 8.8 mg/dL — ABNORMAL LOW (ref 8.9–10.3)
Chloride: 98 mmol/L (ref 98–111)
Creatinine, Ser: 0.89 mg/dL (ref 0.44–1.00)
GFR, Estimated: >60 mL/min (ref >60)
Glucose, Bld: 119 mg/dL — ABNORMAL HIGH (ref 70–99)
Potassium: 3.3 mmol/L — ABNORMAL LOW (ref 3.5–5.1)
Sodium: 134 mmol/L — ABNORMAL LOW (ref 135–145)

## 2021-01-21 LAB — RESP PANEL BY RT-PCR (FLU A&B, COVID) ARPGX2
Influenza A by PCR: NEGATIVE
Influenza B by PCR: NEGATIVE
SARS Coronavirus 2 by RT PCR: POSITIVE — AB

## 2021-01-21 LAB — BRAIN NATRIURETIC PEPTIDE: B Natriuretic Peptide: 18.2 pg/mL (ref 0.0–100.0)

## 2021-01-21 LAB — PROCALCITONIN: Procalcitonin: <0.1 ng/mL

## 2021-01-21 LAB — TROPONIN I (HIGH SENSITIVITY): Troponin I (High Sensitivity): 7 ng/L (ref <18)

## 2021-01-21 LAB — MAGNESIUM: Magnesium: 2 mg/dL (ref 1.7–2.4)

## 2021-01-21 MED ORDER — FLUTICASONE PROPIONATE 50 MCG/ACT NA SUSP: 2.0000 | Freq: Every day | NASAL | 0 refills | Status: DC

## 2021-01-21 MED ORDER — IOHEXOL 350 MG/ML SOLN
100.0000 mL | Freq: Once | INTRAVENOUS | Status: AC | PRN
  Administered 2021-01-21: 100 mL via INTRAVENOUS

## 2021-01-21 MED ORDER — ONDANSETRON 4 MG PO TBDP
4.0000 mg | ORAL_TABLET | Freq: Once | ORAL | Status: AC
  Administered 2021-01-21: 4 mg via ORAL
  Filled 2021-01-21: qty 1

## 2021-01-21 MED ORDER — DOXYCYCLINE HYCLATE 100 MG PO CAPS: 100.0000 mg | ORAL_CAPSULE | Freq: Two times a day (BID) | ORAL | 0 refills | Status: DC

## 2021-01-21 MED ORDER — PREDNISONE 20 MG PO TABS: 20.0000 mg | ORAL_TABLET | Freq: Every day | ORAL | 0 refills | Status: DC

## 2021-01-21 MED ORDER — METHYLPREDNISOLONE SODIUM SUCC 125 MG IJ SOLR
60.0000 mg | Freq: Once | INTRAMUSCULAR | Status: AC
  Administered 2021-01-21: 60 mg via INTRAMUSCULAR

## 2021-01-21 MED ORDER — ALBUTEROL SULFATE HFA 108 (90 BASE) MCG/ACT IN AERS
2.0000 | INHALATION_SPRAY | Freq: Once | RESPIRATORY_TRACT | Status: AC
  Administered 2021-01-21: 2 via RESPIRATORY_TRACT

## 2021-01-21 MED ORDER — LACTATED RINGERS IV BOLUS
1000.0000 mL | Freq: Once | INTRAVENOUS | Status: AC
  Administered 2021-01-21: 1000 mL via INTRAVENOUS

## 2021-01-21 MED ORDER — POTASSIUM CHLORIDE CRYS ER 20 MEQ PO TBCR
40.0000 meq | EXTENDED_RELEASE_TABLET | Freq: Once | ORAL | Status: AC
  Administered 2021-01-21: 40 meq via ORAL
  Filled 2021-01-21: qty 2

## 2021-01-21 NOTE — ED Provider Notes (Addendum)
[facility]    CSN: [id number] Arrival date & time: 01/21/21  1329      History   Chief Complaint Chief Complaint  Patient presents with  . Cough  . Shortness of Breath  . Fatigue    HPI Kaylee Reyes is a 70 y.o. female who is here for persistent cough x 2 months. Her PCP placed her on Zpack which seemed to help, but has not resolved. Now cough is non productive and is provoked with deep breaths. Denies edema of legs. Has been feeling SOB x 3 days. Has an inhaler at home but realized today she has not been using it correctly.  She is also on Macrobid  for UTI diagnosed 5 days ago. Has been sleeping on her recliner since she gets SOB when she lays down flat.      Past Medical History:  Diagnosis Date  . Anemia   . Cancer ([place]) 09/2015   breast cancer  . GERD (gastroesophageal reflux disease)   . History of chicken pox   . Hyperlipidemia   . Hypertension   . Hypothyroidism   . Neuropathy of foot    bilateral  . Pinched nerve   . PONV (postoperative nausea and vomiting)    exploratory surgery at [facility]  . Thyroid disease   . Tuberculosis exposure    Mother had TB.  Pt shows (+) on tests, but has never had.  . Vertigo    last episode over 1 yr ago  . Wears dentures    partial upper and lower    Patient Active Problem List   Diagnosis Date Noted  . Bronchitis 12/04/2020  . Sinusitis 12/04/2020  . Urinary tract infection without hematuria 09/12/2020  . Dysuria 09/12/2020  . Vaginal yeast infection- external  09/12/2020  . Intractable vomiting   . Morbid obesity ([place]) 08/06/2019  . Menopause 03/23/2017  . Rectocele 03/23/2017  . Cystocele, midline 03/23/2017  . Vaginal atrophy 03/23/2017  . Left breast cancer with T3 tumor, >5 cm in greatest dimension ([place])   . S/P bilateral mastectomy 10/28/2015  . Fatty infiltration of liver 10/01/2015  . Gonalgia 10/01/2015  . Body tinea 10/01/2015  . Invasive lobular carcinoma of right breast, stage 2  ([place]) 10/01/2015  . Hypothyroidism 09/25/2015  . Adult BMI 35.0-35.9 kg/sq m 09/25/2015  . Hypercholesterolemia 09/25/2015  . Hypertension 09/25/2015  . Allergic rhinitis 09/25/2015  . GERD (gastroesophageal reflux disease) 09/25/2015  . Alopecia 09/25/2015  . History of peptic ulcer disease 09/25/2015  . Eczema 09/25/2015    Past Surgical History:  Procedure Laterality Date  . ABDOMINAL HYSTERECTOMY  07/13/2013   Total. with BSO for postmenopausal bleeding and fibroids with large cervical polyp. Dr. Levy Sjogren and Dr. Glennon Mac at [facility]  . ABDOMINAL ULTRASOUND  04/17/2011   Hepatomegaly with borderline slenomegaly. Suggestive of fatty liver. s/p cholecystectomy. Portions of aorta obscured  . BREAST SURGERY Right 2011   BREAST BIOPSY  . CESAREAN SECTION  1988  . CHOLECYSTECTOMY  1990  . COLONOSCOPY WITH PROPOFOL N/A 09/01/2017   Procedure: COLONOSCOPY WITH PROPOFOL;  Surgeon: [name], MD;  Location: [facility];  Service: Gastroenterology;  Laterality: N/A;  . DIAGNOSTIC LAPAROSCOPY    . ESOPHAGOGASTRODUODENOSCOPY (EGD) WITH PROPOFOL N/A 01/17/2020   Procedure: ESOPHAGOGASTRODUODENOSCOPY (EGD) WITH PROPOFOL;  Surgeon: [name], MD;  Location: [facility] ENDOSCOPY;  Service: Endoscopy;  Laterality: N/A;  . EXPLORATORY LAPAROTOMY     Left breast  . MASTECTOMY    . MASTECTOMY W/ SENTINEL  NODE BIOPSY Bilateral 10/28/2015   Procedure: MASTECTOMY WITH SENTINEL LYMPH NODE BIOPSY;  Surgeon: Hubbard Robinson, MD;  Location: [facility];  Service: General;  Laterality: Bilateral;  . PORT-A-CATH REMOVAL Left 10/11/2016   Procedure: REMOVAL PORT-A-CATH;  Surgeon: Hubbard Robinson, MD;  Location: [facility];  Service: General;  Laterality: Left;  . PORTACATH PLACEMENT Left 12/23/2015   Procedure: INSERTION PORT-A-CATH;  Surgeon: Hubbard Robinson, MD;  Location: [facility];  Service: General;  Laterality: Left;  . WRIST SURGERY      OB History    Gravida  3   Para  2   Term  2    Preterm      AB  1   Living  2     SAB  1   IAB      Ectopic      Multiple      Live Births  2            Home Medications    Prior to Admission medications   Medication Sig Start Date End Date Taking? Authorizing Provider  albuterol (VENTOLIN HFA) 108 (90 Base) MCG/ACT inhaler INHALE 2 PUFFS BY MOUTH EVERY 6 HOURS AS NEEDED WHEEZING OR SHORTNESS OF BREATH 11/12/19  Yes Birdie Sons, MD  atenolol (TENORMIN) 50 MG tablet TAKE 1 TABLET DAILY 11/28/20  Yes Birdie Sons, MD  B Complex-C (SUPER B COMPLEX PO) Take 1 tablet by mouth daily.   Yes [provider]  CALCIUM CITRATE PO Take 1 tablet by mouth daily.   Yes [provider]  Carboxymeth-Glycerin-Polysorb (REFRESH OPTIVE MEGA-3 OP) Place 1 drop into both eyes daily.   Yes [provider]  docusate sodium (COLACE) 100 MG capsule Take 200 mg by mouth daily.    Yes [provider]  ELDERBERRY PO Take by mouth daily.    Yes [provider]  fexofenadine (ALLEGRA) 180 MG tablet Take 180 mg by mouth daily.   Yes [provider]  hydrochlorothiazide (HYDRODIURIL) 25 MG tablet TAKE 1 TABLET DAILY 11/28/20  Yes Birdie Sons, MD  ibuprofen (ADVIL,MOTRIN) 200 MG tablet Take 400 mg by mouth every 6 (six) hours as needed for mild pain.    Yes [provider]  ketoconazole (NIZORAL) 2 % cream Apply 1 application topically daily. 09/22/20  Yes Mar Daring, PA-C  lansoprazole (PREVACID) 30 MG capsule TAKE 1 CAPSULE DAILY AT 12 NOON 11/14/20  Yes Birdie Sons, MD  letrozole [facility]) 2.5 MG tablet TAKE 1 TABLET BY MOUTH EVERY DAY 09/22/20 07/04/21 Yes Birdie Sons, MD  levothyroxine (SYNTHROID) 75 MCG tablet TAKE 1 TABLET(75 MCG) BY MOUTH DAILY 12/08/20  Yes Birdie Sons, MD  magic mouthwash SOLN 1 part 2% viscous lidocaine: 1 part Maalox: 1 part diphenhydramine 12.5 mg per 5 ml elixir. Rinse and spit 5 ml every six hour as needed 09/22/20  Yes  Mar Daring, PA-C  Magnesium 400 MG TABS Take 600 mg by mouth daily.    Yes [provider]  meclizine (ANTIVERT) 12.5 MG tablet Take 1 tablet (12.5 mg total) by mouth 3 (three) times daily as needed for dizziness. 01/02/21  Yes Birdie Sons, MD  Misc Natural Products (TURMERIC CURCUMIN) CAPS Take 1 capsule by mouth daily.   Yes [provider]  nitrofurantoin, macrocrystal-monohydrate, (MACROBID) 100 MG capsule Take 1 capsule (100 mg total) by mouth 2 (two) times daily. 01/19/21  Yes Mar Daring, PA-C  nystatin (MYCOSTATIN/NYSTOP) powder  Apply 1 application topically 3 (three) times daily. 09/22/20  Yes Mar Daring, PA-C  sodium chloride (OCEAN) 0.65 % SOLN nasal spray Place 1 spray into both nostrils as needed for congestion. 12/04/20  Yes Flinchum, Kelby Aline, FNP  triamcinolone cream (KENALOG) 0.5 % Apply 1 application topically 2 (two) times daily as needed. For itching 09/17/19  Yes Birdie Sons, MD  Vitamin D, Ergocalciferol, (DRISDOL) 1.25 MG (50000 UNIT) CAPS capsule TAKE 1 CAPSULE BY MOUTH 1 TIME A WEEK 12/23/20  Yes Lloyd Huger, MD  fluticasone (FLONASE) 50 MCG/ACT nasal spray Place 2 sprays into both nostrils daily. 01/21/21 01/21/21 Yes Rodriguez-Southworth, Sunday [place]ill[place], PA-C    Family History Family History  Problem Relation Age of Onset  . Hypertension Mother   . Thyroid disease Mother   . Hypertension Brother   . Diabetes Brother   . Breast cancer Neg Hx   . Ovarian cancer Neg Hx   . Colon cancer Neg Hx     Social History Social History   Tobacco Use  . Smoking status: Never Smoker  . Smokeless tobacco: Never Used  Vaping Use  . Vaping Use: Never used  Substance Use Topics  . Alcohol use: No    Alcohol/week: 0.0 standard drinks  . Drug use: No     Allergies   Penicillins, Nickel, Keflex [cephalexin], and Pravastatin sodium   Review of Systems Review of Systems  Constitutional: Positive for activity change,  appetite change and fatigue. Negative for chills, diaphoresis and fever.  HENT: Negative for congestion, postnasal drip and rhinorrhea.   Respiratory: Positive for cough and shortness of breath. Negative for chest tightness and wheezing.   Cardiovascular: Negative for chest pain and leg swelling.  Gastrointestinal: Negative for abdominal pain, diarrhea, nausea and vomiting.     Physical Exam Triage Vital Signs ED Triage Vitals  Enc Vitals Group     BP 01/21/21 1437 (!) 149/94     Pulse Rate 01/21/21 1437 72     Resp 01/21/21 1437 18     Temp 01/21/21 1437 98.6 F (37 C)     Temp Source 01/21/21 1437 Oral     SpO2 01/21/21 1437 96 %     Weight 01/21/21 1438 288 lb (130.6 kg)     Height 01/21/21 1438 5\' 6"  (1.676 m)     Head Circumference --      Peak Flow --      Pain Score 01/21/21 1436 4     Pain Loc --      Pain Edu? --      Excl. in [place]? --    No data found.  Updated Vital Signs BP (!) 149/94 (BP Location: Other (Comment)) Comment (BP Location): lower arm  Pulse 72   Temp 98.6 F (37 C) (Oral)   Resp 18   Ht 5\' 6"  (1.676 m)   Wt 288 lb (130.6 kg)   SpO2 96%   BMI 46.48 kg/m   Visual Acuity Right Eye Distance:   Left Eye Distance:   Bilateral Distance:    Right Eye Near:   Left Eye Near:    Bilateral Near:     Physical Exam Vitals and nursing note reviewed.  Constitutional:      Appearance: She is well-developed. She is obese. She is not ill-appearing or toxic-appearing.     Comments: With SOB with minimal walking  HENT:     Head: Normocephalic.     Right Ear: Tympanic membrane, ear canal and external  ear normal.     Left Ear: Tympanic membrane, ear canal and external ear normal.     Nose: Nose normal.     Comments: Has tenderness on R maxillary and ethmoid sinuses    Mouth/Throat:     Mouth: Mucous membranes are moist.  Eyes:     General: No scleral icterus.    Conjunctiva/sclera: Conjunctivae normal.  Cardiovascular:     Rate and Rhythm: Normal  rate and regular rhythm.     Heart sounds: No murmur heard.   Pulmonary:     Effort: Tachypnea present.     Breath sounds: Normal breath sounds. No wheezing or rhonchi.     Comments: Does not appear SOB when just sitting and able to talk full sentenses Chest:     Chest wall: No edema.  Musculoskeletal:        General: Normal range of motion.     Cervical back: Neck supple.  Lymphadenopathy:     Cervical: No cervical adenopathy.  Skin:    General: Skin is warm and dry.     Findings: No rash.  Neurological:     Mental Status: She is alert and oriented to person, place, and time.     Gait: Gait normal.  Psychiatric:        Mood and Affect: Mood normal.        Behavior: Behavior normal.        Thought Content: Thought content normal.        Judgment: Judgment normal.    UC Treatments / Results  Labs (all labs ordered are listed, but only abnormal results are displayed) Labs Reviewed  RESP PANEL BY RT-PCR (FLU A&B, COVID) ARPGX2 - Abnormal; Notable for the following components:      Result Value   SARS Coronavirus 2 by RT PCR POSITIVE (*)    All other components within normal limits  BASIC METABOLIC PANEL - Abnormal; Notable for the following components:   Sodium 134 (*)    Potassium 3.3 (*)    CO2 19 (*)    Glucose, Bld 119 (*)    Calcium 8.8 (*)    Anion gap 17 (*)    All other components within normal limits  CBC WITH DIFFERENTIAL/PLATELET  BRAIN NATRIURETIC PEPTIDE    EKG NSR with non specific T wave changes and prolonged QT. These are changes compared to EKG from 09/2016  Radiology DG Chest 2 View  Result Date: 01/21/2021 CLINICAL DATA:  Cough for 2 months, shortness of breath and fatigue for 2-3 days EXAM: CHEST - 2 VIEW COMPARISON:  05/20/2016 FINDINGS: Normal heart size, mediastinal contours, and pulmonary vascularity. Lungs clear. No acute infiltrate, pleural effusion, or pneumothorax. Interval removal of LEFT subclavian Port-A-Cath. No acute osseous  findings. IMPRESSION: No acute abnormalities. Electronically Signed   By: Lavonia Dana M.D.   On: 01/21/2021 15:21    Procedures Procedures (including critical care time)  Medications Ordered in UC Medications  methylPREDNISolone sodium succinate (SOLU-MEDROL) 125 mg/2 mL injection 60 mg (60 mg Intramuscular Given 01/21/21 1621)  albuterol (VENTOLIN HFA) 108 (90 Base) MCG/ACT inhaler 2 puff (2 puffs Inhalation Given 01/21/21 1621)    Initial Impression / Assessment and Plan / UC Course  I have reviewed the triage vital signs and the nursing notes. Pertinent labs & imaging results that were available during my care of the patient were reviewed by me and considered in my medical decision making (see chart for details). I checked her pulse ox while walking  in the room and dropped to 92% which I had noticed her SOB just drom getting dressed.  I ordered CBC, BMP, BNP.  While waiting for her labs she was given Albuterol inhaler 2 puffs and Solumedrol 60 mg IM. At discharge I went to check on her and reviewed her labs and said she felt better. I had her walk and her pulse stayed at 97%. I explained her EKG shows changes that were not present before and due to chronic conditions needs treatment for covid, and ones the meds we gave her wear off, her oxygen may drop again and needs to be monitored. She went to [facility].  Final Clinical Impressions(s) / UC Diagnoses   Final diagnoses:  Acute recurrent maxillary sinusitis  Cough  SOB (shortness of breath)  Encounter for laboratory testing for COVID-19 virus  COVID-19 virus infection  Hypokalemia  Nonspecific abnormal electrocardiogram (ECG) (EKG)     Discharge Instructions     Your covid test is positive and with your being so short of breath and your oxygen dropping you ned to go to the hospital for oxygen and treatment.  Your sodium and potasium are also a little low and needs to be treated and monitored.       Shelby Mattocks,  PA-C 01/21/21 1823    Rodriguez-Southworth, [place], PA-C 01/21/21 2102

## 2021-01-21 NOTE — Discharge Instructions (Addendum)
Your covid test is positive and with your being so short of breath and your oxygen dropping you ned to go to the hospital for oxygen and treatment.  Your sodium and potasium are also a little low and needs to be treated and monitored.

## 2021-01-21 NOTE — ED Triage Notes (Signed)
Patient in today c/o cough x 2 months and sob and fatigue x 2-3 days. Patient was treated with a zpak when the cough first started. Patient has not been tested for covid and has not had the covid vaccines.  Patient is also being treated for a UTI at this time and is taking antibiotics.

## 2021-01-21 NOTE — ED Provider Notes (Signed)
[facility] Emergency Department Provider Note  ____________________________________________   Event Date/Time   First MD Initiated Contact with Patient 01/21/21 2101     (approximate)  I have reviewed the triage vital signs and the nursing notes.   HISTORY  Chief Complaint No chief complaint on file.   HPI Kaylee Reyes is a 70 y.o. female with a past medical history of HTN, HDL, GERD, breast cancer currently on**, thyroid disease, anemia and approximately 2 weeks of cough who presents have been referred from urgent care for assessment after diagnosis of Covid today and concerns for SPO2 decreasing to 92% on trial of ambulation at urgent care.  Patient states she has had a cough for about 2 months as well as her shortness of breath with exertion but feels it has gotten a little bit worse last couple days.  She also states that couple days ago she had some diarrhea.  She denies any fever, chills, headache, earache, sore throat, chest pain, abdominal pain, back pain, nausea, vomiting rash or extremity pain.  She notes she was treated for UTI last week and some burning with urination then but none of the last couple days she recently had her antibiotic switched from Bactrim to [place] this seems to be helping a lot.  She has not been vaccinated against Covid.  She is no clearly getting aggravating factors.         Past Medical History:  Diagnosis Date  . Anemia   . Cancer ([place]) 09/2015   breast cancer  . GERD (gastroesophageal reflux disease)   . History of chicken pox   . Hyperlipidemia   . Hypertension   . Hypothyroidism   . Neuropathy of foot    bilateral  . Pinched nerve   . PONV (postoperative nausea and vomiting)    exploratory surgery at [facility]  . Thyroid disease   . Tuberculosis exposure    Mother had TB.  Pt shows (+) on tests, but has never had.  . Vertigo    last episode over 1 yr ago  . Wears dentures    partial upper and lower     Patient Active Problem List   Diagnosis Date Noted  . Bronchitis 12/04/2020  . Sinusitis 12/04/2020  . Urinary tract infection without hematuria 09/12/2020  . Dysuria 09/12/2020  . Vaginal yeast infection- external  09/12/2020  . Intractable vomiting   . Morbid obesity ([place]) 08/06/2019  . Menopause 03/23/2017  . Rectocele 03/23/2017  . Cystocele, midline 03/23/2017  . Vaginal atrophy 03/23/2017  . Left breast cancer with T3 tumor, >5 cm in greatest dimension ([place])   . S/P bilateral mastectomy 10/28/2015  . Fatty infiltration of liver 10/01/2015  . Gonalgia 10/01/2015  . Body tinea 10/01/2015  . Invasive lobular carcinoma of right breast, stage 2 ([place]) 10/01/2015  . Hypothyroidism 09/25/2015  . Adult BMI 35.0-35.9 kg/sq m 09/25/2015  . Hypercholesterolemia 09/25/2015  . Hypertension 09/25/2015  . Allergic rhinitis 09/25/2015  . GERD (gastroesophageal reflux disease) 09/25/2015  . Alopecia 09/25/2015  . History of peptic ulcer disease 09/25/2015  . Eczema 09/25/2015    Past Surgical History:  Procedure Laterality Date  . ABDOMINAL HYSTERECTOMY  07/13/2013   Total. with BSO for postmenopausal bleeding and fibroids with large cervical polyp. Dr. Levy Sjogren and Dr. Glennon Mac at [facility]  . ABDOMINAL ULTRASOUND  04/17/2011   Hepatomegaly with borderline slenomegaly. Suggestive of fatty liver. s/p cholecystectomy. Portions of aorta obscured  . BREAST SURGERY Right 2011  BREAST BIOPSY  . CESAREAN SECTION  1988  . CHOLECYSTECTOMY  1990  . COLONOSCOPY WITH PROPOFOL N/A 09/01/2017   Procedure: COLONOSCOPY WITH PROPOFOL;  Surgeon: [name], MD;  Location: [place];  Service: Gastroenterology;  Laterality: N/A;  . DIAGNOSTIC LAPAROSCOPY    . ESOPHAGOGASTRODUODENOSCOPY (EGD) WITH PROPOFOL N/A 01/17/2020   Procedure: ESOPHAGOGASTRODUODENOSCOPY (EGD) WITH PROPOFOL;  Surgeon: [name], MD;  Location: [facility] ENDOSCOPY;  Service: Endoscopy;  Laterality: N/A;  . EXPLORATORY  LAPAROTOMY     Left breast  . MASTECTOMY    . MASTECTOMY W/ SENTINEL NODE BIOPSY Bilateral 10/28/2015   Procedure: MASTECTOMY WITH SENTINEL LYMPH NODE BIOPSY;  Surgeon: Hubbard Robinson, MD;  Location: [facility] ORS;  Service: General;  Laterality: Bilateral;  . PORT-A-CATH REMOVAL Left 10/11/2016   Procedure: REMOVAL PORT-A-CATH;  Surgeon: Hubbard Robinson, MD;  Location: [facility] ORS;  Service: General;  Laterality: Left;  . PORTACATH PLACEMENT Left 12/23/2015   Procedure: INSERTION PORT-A-CATH;  Surgeon: Hubbard Robinson, MD;  Location: [facility] ORS;  Service: General;  Laterality: Left;  . WRIST SURGERY      Prior to Admission medications   Medication Sig Start Date End Date Taking? Authorizing Provider  albuterol (VENTOLIN HFA) 108 (90 Base) MCG/ACT inhaler INHALE 2 PUFFS BY MOUTH EVERY 6 HOURS AS NEEDED WHEEZING OR SHORTNESS OF BREATH 11/12/19   Birdie Sons, MD  atenolol (TENORMIN) 50 MG tablet TAKE 1 TABLET DAILY 11/28/20   Birdie Sons, MD  B Complex-C (SUPER B COMPLEX PO) Take 1 tablet by mouth daily.    [provider]  CALCIUM CITRATE PO Take 1 tablet by mouth daily.    [provider]  Carboxymeth-Glycerin-Polysorb (REFRESH OPTIVE MEGA-3 OP) Place 1 drop into both eyes daily.    [provider]  docusate sodium (COLACE) 100 MG capsule Take 200 mg by mouth daily.     [provider]  ELDERBERRY PO Take by mouth daily.     [provider]  fexofenadine (ALLEGRA) 180 MG tablet Take 180 mg by mouth daily.    [provider]  hydrochlorothiazide (HYDRODIURIL) 25 MG tablet TAKE 1 TABLET DAILY 11/28/20   Birdie Sons, MD  ibuprofen (ADVIL,MOTRIN) 200 MG tablet Take 400 mg by mouth every 6 (six) hours as needed for mild pain.     [provider]  ketoconazole (NIZORAL) 2 % cream Apply 1 application topically daily. 09/22/20   Mar Daring, PA-C  lansoprazole (PREVACID) 30 MG capsule TAKE 1 CAPSULE DAILY AT 12 NOON  11/14/20   Birdie Sons, MD  letrozole [facility]) 2.5 MG tablet TAKE 1 TABLET BY MOUTH EVERY DAY 09/22/20 07/04/21  Birdie Sons, MD  levothyroxine (SYNTHROID) 75 MCG tablet TAKE 1 TABLET(75 MCG) BY MOUTH DAILY 12/08/20   Birdie Sons, MD  magic mouthwash SOLN 1 part 2% viscous lidocaine: 1 part Maalox: 1 part diphenhydramine 12.5 mg per 5 ml elixir. Rinse and spit 5 ml every six hour as needed 09/22/20   Mar Daring, PA-C  Magnesium 400 MG TABS Take 600 mg by mouth daily.     [provider]  meclizine (ANTIVERT) 12.5 MG tablet Take 1 tablet (12.5 mg total) by mouth 3 (three) times daily as needed for dizziness. 01/02/21   Birdie Sons, MD  Misc Natural Products (TURMERIC CURCUMIN) CAPS Take 1 capsule by mouth daily.    [provider]  nitrofurantoin, macrocrystal-monohydrate, (MACROBID) 100 MG capsule Take 1 capsule (100 mg total)  by mouth 2 (two) times daily. 01/19/21   Mar Daring, PA-C  nystatin (MYCOSTATIN/NYSTOP) powder Apply 1 application topically 3 (three) times daily. 09/22/20   Mar Daring, PA-C  sodium chloride (OCEAN) 0.65 % SOLN nasal spray Place 1 spray into both nostrils as needed for congestion. 12/04/20   Flinchum, Kelby Aline, FNP  triamcinolone cream (KENALOG) 0.5 % Apply 1 application topically 2 (two) times daily as needed. For itching 09/17/19   Birdie Sons, MD  Vitamin D, Ergocalciferol, (DRISDOL) 1.25 MG (50000 UNIT) CAPS capsule TAKE 1 CAPSULE BY MOUTH 1 TIME A WEEK 12/23/20   Lloyd Huger, MD  fluticasone (FLONASE) 50 MCG/ACT nasal spray Place 2 sprays into both nostrils daily. 01/21/21 01/21/21  Rodriguez-Southworth, Sunday Spillers, PA-C    Allergies Penicillins, Nickel, Keflex [cephalexin], and Pravastatin sodium  Family History  Problem Relation Age of Onset  . Hypertension Mother   . Thyroid disease Mother   . Hypertension Brother   . Diabetes Brother   . Breast cancer Neg Hx   . Ovarian cancer Neg Hx    . Colon cancer Neg Hx     Social History Social History   Tobacco Use  . Smoking status: Never Smoker  . Smokeless tobacco: Never Used  Vaping Use  . Vaping Use: Never used  Substance Use Topics  . Alcohol use: No    Alcohol/week: 0.0 standard drinks  . Drug use: No    Review of Systems  Review of Systems  Constitutional: Positive for malaise/fatigue. Negative for chills and fever.  HENT: Negative for sore throat.   Eyes: Negative for pain.  Respiratory: Positive for cough and shortness of breath. Negative for stridor.   Cardiovascular: Negative for chest pain.  Gastrointestinal: Negative for vomiting.  Genitourinary: Positive for dysuria (last week but now resolved ).  Musculoskeletal: Negative for myalgias.  Skin: Negative for rash.  Neurological: Negative for seizures, loss of consciousness and headaches.  Psychiatric/Behavioral: Negative for suicidal ideas.  All other systems reviewed and are negative.     ____________________________________________   PHYSICAL EXAM:  VITAL SIGNS: ED Triage Vitals  Enc Vitals Group     BP 01/21/21 1917 (!) 152/103     Pulse Rate 01/21/21 1917 79     Resp 01/21/21 1917 18     Temp 01/21/21 1917 98.9 F (37.2 C)     Temp Source 01/21/21 1917 Oral     SpO2 01/21/21 1917 96 %     Weight 01/21/21 1915 288 lb (130.6 kg)     Height 01/21/21 1915 5\' 6"  (1.676 m)     Head Circumference --      Peak Flow --      Pain Score 01/21/21 1915 0     Pain Loc --      Pain Edu? --      Excl. in [place]? --    Vitals:   01/21/21 1917 01/21/21 2218  BP: (!) 152/103 (!) 157/73  Pulse: 79 69  Resp: 18 20  Temp: 98.9 F (37.2 C)   SpO2: 96% 95%   Physical Exam Vitals and nursing note reviewed.  Constitutional:      General: She is not in acute distress.    Appearance: She is well-developed and well-nourished. She is obese.  HENT:     Head: Normocephalic and atraumatic.     Right Ear: External ear normal.     Left Ear: External ear  normal.     Nose: Nose normal.  Mouth/Throat:     Mouth: Mucous membranes are dry.  Eyes:     Conjunctiva/sclera: Conjunctivae normal.  Cardiovascular:     Rate and Rhythm: Normal rate and regular rhythm.     Heart sounds: No murmur heard.   Pulmonary:     Effort: Pulmonary effort is normal. No respiratory distress.     Breath sounds: Normal breath sounds.  Abdominal:     Palpations: Abdomen is soft.     Tenderness: There is no abdominal tenderness.  Musculoskeletal:        General: No deformity.     Cervical back: Neck supple.     Right lower leg: Edema present.     Left lower leg: Edema present.  Skin:    General: Skin is warm and dry.     Capillary Refill: Capillary refill takes 2 to 3 seconds.  Neurological:     Mental Status: She is alert and oriented to person, place, and time.  Psychiatric:        Mood and Affect: Mood and affect and mood normal.      ____________________________________________   LABS (all labs ordered are listed, but only abnormal results are displayed)  Labs Reviewed  D-DIMER, QUANTITATIVE (NOT AT [facility]) - Abnormal; Notable for the following components:      Result Value   D-Dimer, Quant 2.27 (*)    All other components within normal limits  MAGNESIUM  PROCALCITONIN  TROPONIN I (HIGH SENSITIVITY)  TROPONIN I (HIGH SENSITIVITY)   ____________________________________________  EKG  Obtained earlier today shows sinus rhythm with ventricular rate of 72, normal axis, nonspecific T wave change in V3 without any other clear evidence of acute ischemia or significant underlying arrhythmia.  QTC is 479 but otherwise intervals are unremarkable. ____________________________________________  RADIOLOGY  ED MD interpretation: No focal consolidation, large effusion, no thorax, edema or any other clear acute thoracic process.  Official radiology report(s): DG Chest 2 View  Result Date: 01/21/2021 CLINICAL DATA:  Cough for 2 months, shortness of  breath and fatigue for 2-3 days EXAM: CHEST - 2 VIEW COMPARISON:  05/20/2016 FINDINGS: Normal heart size, mediastinal contours, and pulmonary vascularity. Lungs clear. No acute infiltrate, pleural effusion, or pneumothorax. Interval removal of LEFT subclavian Port-A-Cath. No acute osseous findings. IMPRESSION: No acute abnormalities. Electronically Signed   By: Lavonia Dana M.D.   On: 01/21/2021 15:21    ____________________________________________   PROCEDURES  Procedure(s) performed (including Critical Care):  .1-3 Lead EKG Interpretation Performed by: Lucrezia Starch, MD Authorized by: Lucrezia Starch, MD     Interpretation: normal     ECG rate assessment: normal     Rhythm: sinus rhythm     Ectopy: none     Conduction: normal       ____________________________________________   INITIAL IMPRESSION / ASSESSMENT AND PLAN / ED COURSE      Patient presents with above-stated history exam for assessment of seemingly acute on subacute worsening of cough and shortness of breath the last couple of days as well as recent diagnosis earlier today of COVID-19.  On arrival she is hypertensive with a BP of 153/103 with otherwise stable vital signs on room air.  ECG obtained in urgent care earlier today was reviewed by myself and shows an isolated nonspecific T wave change but no other clear evidence of acute ischemia or significant underlying arrhythmia.  Troponin obtained today is nonelevated and have low suspicion for ACS although will plan to obtain a 2-hour repeat.Marland Kitchen  BMP obtained  today shows a K of 3.3 and a bicarb of 19 anion gap of 17 with suspected secondary to mild dehydration but no significant electrolyte or metabolic derangements.  CBC shows no leukocytosis or acute anemia.  Magnesium is WNL.  Chest x-ray obtained earlier today was reviewed by myself and showed no evidence of pulmonary edema, focal consolidation, large effusion or any other clear acute intrathoracic process.  Very low  suspicion for acute bacterial process given absence of fever or elevated white blood cell count or consolidation on chest x-ray.  D-dimer sent to assess for possible PE contributing to symptoms.  Dimer is elevated.  We will plan to obtain CTA chest to assess for PE.  Care patient signed over to oncoming provider approximately 2300.  Plan is to follow-up CTA chest and reassess ambulatory status.        ____________________________________________   FINAL CLINICAL IMPRESSION(S) / ED DIAGNOSES  Final diagnoses:  COVID  Positive D dimer    Medications  lactated ringers bolus 1,000 mL (0 mLs Intravenous Stopped 01/21/21 2320)  potassium chloride SA (KLOR-CON) CR tablet 40 mEq (40 mEq Oral Given 01/21/21 2220)  ondansetron (ZOFRAN-ODT) disintegrating tablet 4 mg (4 mg Oral Given 01/21/21 2320)     ED Discharge Orders    None       Note:  This document was prepared using Dragon voice recognition software and may include unintentional dictation errors.   Lucrezia Starch, MD 01/21/21 [phone]

## 2021-01-21 NOTE — ED Triage Notes (Signed)
Pt to ED via POV from [place]. Pt c/o cough and shortness of breath x1week. Pt tested positive today. Pt states her sodium and potassium were low from her blood work done today. Pt denies any pain. Pt currently being treated for UTI.

## 2021-01-21 NOTE — ED Notes (Signed)
Pt states coming in from urgent care with because while she was walking her oxygen saturation would drop. Pt states she went to the providers office initially because when she tried to take a deep breath she felt she couldn't take a deep breath, and then would cough.

## 2021-01-22 ENCOUNTER — Encounter: Payer: Self-pay | Admitting: Physician Assistant

## 2021-01-22 ENCOUNTER — Other Ambulatory Visit: Payer: Self-pay | Admitting: Physician Assistant

## 2021-01-22 DIAGNOSIS — Z6835 Body mass index (BMI) 35.0-35.9, adult: Secondary | ICD-10-CM

## 2021-01-22 DIAGNOSIS — U071 COVID-19: Secondary | ICD-10-CM

## 2021-01-22 DIAGNOSIS — C50912 Malignant neoplasm of unspecified site of left female breast: Secondary | ICD-10-CM

## 2021-01-22 LAB — TROPONIN I (HIGH SENSITIVITY): Troponin I (High Sensitivity): 7 ng/L (ref <18)

## 2021-01-22 MED ORDER — ALBUTEROL SULFATE HFA 108 (90 BASE) MCG/ACT IN AERS: 2.0000 | INHALATION_SPRAY | Freq: Four times a day (QID) | RESPIRATORY_TRACT | 2 refills | Status: DC | PRN

## 2021-01-22 MED ORDER — IPRATROPIUM-ALBUTEROL 0.5-2.5 (3) MG/3ML IN SOLN
3.0000 mL | Freq: Once | RESPIRATORY_TRACT | Status: AC
  Administered 2021-01-22: 3 mL via RESPIRATORY_TRACT
  Filled 2021-01-22: qty 6

## 2021-01-22 MED ORDER — IPRATROPIUM-ALBUTEROL 0.5-2.5 (3) MG/3ML IN SOLN
3.0000 mL | Freq: Once | RESPIRATORY_TRACT | Status: AC
  Administered 2021-01-22: 3 mL via RESPIRATORY_TRACT

## 2021-01-22 NOTE — Discharge Instructions (Signed)
[facility]  [phone]  To help boost your immune system against COVID-19, please take:  - Vitamin D3 4,000 IU/day - Vitamin C 500-1,000?mg twice a day - Quercetin 250?mg twice a day - Zinc 100?mg/day - Melatonin 10?mg before bedtime (causes drowsiness) - Aspirin 325?mg/day (unless contraindicated) - Pulse Oximeter Monitoring of oxygen saturation is recommended - check your oxygen 3 times a day if less than 90% return to the ER  These medications are all over-the-counter and do not need a prescription.   QUARANTINE INSTRUCTION  Follow these instructions at home:  Protecting others To avoid spreading the illness to other people: Quarantine in your home until you have had no cough and fever for 7 days. Household members should also be quarantine for at least 14 days after being exposed to you. Wash your hands often with soap and water. If soap and water are not available, use an alcohol-based hand sanitizer. If you have not cleaned your hands, do not touch your face. Make sure that all people in your household wash their hands well and often. Cover your nose and mouth when you cough or sneeze. Throw away used tissues. Stay home if you have any cold-like or flu-like symptoms. General instructions Go to your local pharmacy and buy a pulse oximeter (this is a machine that measures your oxygen). Check your oxygen levels at least 3 times a day. If your oxygen level is 90% or less return to the emergency room immediately Take over-the-counter and prescription medicines only as told by your health care provider. If you need medication for fever take tylenol or ibuprofen Drink enough fluid to keep your urine pale yellow. Rest at home as directed by your health care provider. Do not give aspirin to a child with the flu, because of the association with Reye's syndrome. Do not use tobacco products, including cigarettes, chewing tobacco, and e-cigarettes. If you need help quitting,  ask your health care provider. Keep all follow-up visits as told by your health care provider. This is important. How is this prevented? Avoid areas where an outbreak has been reported. Avoid large groups of people. Keep a safe distance from people who are coughing and sneezing. Do not touch your face if you have not cleaned your hands. When you are around people who are sick or might be sick, wear a mask to protect yourself. Contact a health care provider if: You have symptoms of SARS (cough, fever, chest pain, shortness of breath) that are not getting better at home. You have a fever. If you have difficulty breathing go to your local ER or call 911

## 2021-01-22 NOTE — ED Notes (Signed)
Provider aware of BP and is okay with pt being discharged

## 2021-01-22 NOTE — Progress Notes (Signed)
I connected by phone with Kaylee Reyes on 01/22/2021 at 9:29 AM to discuss the potential use of a new treatment for mild to moderate COVID-19 viral infection in non-hospitalized patients.  This patient is a 70 y.o. female that meets the FDA criteria for Emergency Use Authorization of COVID monoclonal antibody sotrovimab.  Has a (+) direct SARS-CoV-2 viral test result  Has mild or moderate COVID-19   Is NOT hospitalized due to COVID-19  Is within 10 days of symptom onset  Has at least one of the high risk factor(s) for progression to severe COVID-19 and/or hospitalization as defined in EUA.  Specific high risk criteria : Older age (>/= 69 yo), BMI > 25, Immunosuppressive Disease or Treatment, Cardiovascular disease or hypertension and Other high risk medical condition per CDC:  unvaccinated   I have spoken and communicated the following to the patient or parent/caregiver regarding COVID monoclonal antibody treatment:  1. FDA has authorized the emergency use for the treatment of mild to moderate COVID-19 in adults and pediatric patients with positive results of direct SARS-CoV-2 viral testing who are 72 years of age and older weighing at least 40 kg, and who are at high risk for progressing to severe COVID-19 and/or hospitalization.  2. The significant known and potential risks and benefits of COVID monoclonal antibody, and the extent to which such potential risks and benefits are unknown.  3. Information on available alternative treatments and the risks and benefits of those alternatives, including clinical trials.  4. Patients treated with COVID monoclonal antibody should continue to self-isolate and use infection control measures (e.g., wear mask, isolate, social distance, avoid sharing personal items, clean and disinfect "high touch" surfaces, and frequent handwashing) according to CDC guidelines.   5. The patient or parent/caregiver has the option to accept or refuse COVID monoclonal  antibody treatment.  After reviewing this information with the patient, the patient has agreed to receive one of the available covid 19 monoclonal antibodies and will be provided an appropriate fact sheet prior to infusion. Pt will get Sotrovimab  Sx onset 2/10. Set up for infusion on 2/18 @ 10:30am. Directions given to [facility]. Pt is aware that insurance will be charged an infusion fee. Pt Is unvaccinated.   Angelena Form 01/22/2021 9:29 AM

## 2021-01-22 NOTE — ED Provider Notes (Signed)
Patient ambulated with sats of 91% which resolved immediately when she sat down. She then received 2 duonebs and sats improved to mid 90s with ambulation. No respiratory distress. CTA negative for PE.  2 high-sensitivity troponins negative.  Normal procalcitonin.  Will discharge home with an albuterol inhaler, discussed oxygen monitoring at home, immune system boosting, follow-up at the post Covid clinic, and my standard return precautions.  Will refer to monoclonal antibody infusion clinic.   I have personally reviewed the images performed during this visit and I agree with the Radiologist's read.   Interpretation by Radiologist:  DG Chest 2 View  Result Date: 01/21/2021 CLINICAL DATA:  Cough for 2 months, shortness of breath and fatigue for 2-3 days EXAM: CHEST - 2 VIEW COMPARISON:  05/20/2016 FINDINGS: Normal heart size, mediastinal contours, and pulmonary vascularity. Lungs clear. No acute infiltrate, pleural effusion, or pneumothorax. Interval removal of LEFT subclavian Port-A-Cath. No acute osseous findings. IMPRESSION: No acute abnormalities. Electronically Signed   By: Lavonia Dana M.D.   On: 01/21/2021 15:21   CT Angio Chest PE W and/or Wo Contrast  Result Date: 01/21/2021 CLINICAL DATA:  Cough for 2 months, short of breath, fatigue EXAM: CT ANGIOGRAPHY CHEST WITH CONTRAST TECHNIQUE: Multidetector CT imaging of the chest was performed using the standard protocol during bolus administration of intravenous contrast. Multiplanar CT image reconstructions and MIPs were obtained to evaluate the vascular anatomy. CONTRAST:  175mL OMNIPAQUE IOHEXOL 350 MG/ML SOLN COMPARISON:  01/21/2021, 04/29/2016 FINDINGS: Cardiovascular: This is a technically adequate evaluation of the pulmonary vasculature. No filling defects or pulmonary emboli. The heart is mildly enlarged without pericardial effusion. No evidence of thoracic aortic aneurysm or dissection. Minimal atherosclerosis of the aortic arch.  Mediastinum/Nodes: No enlarged mediastinal, hilar, or axillary lymph nodes. Thyroid gland, trachea, and esophagus demonstrate no significant findings. Lungs/Pleura: There is bilateral subpleural ground-glass airspace disease, most pronounced within the dependent lower lobes and right apex. No effusion or pneumothorax. Central airways are patent. Upper Abdomen: Diffuse hepatic steatosis. No acute upper abdominal process. Musculoskeletal: No acute or destructive bony lesions. Postsurgical changes are seen from bilateral mastectomies. Reconstructed images demonstrate no additional findings. Review of the MIP images confirms the above findings. IMPRESSION: 1. No evidence of pulmonary embolus. 2. Scattered bilateral subpleural ground-glass airspace disease, compatible with multifocal pneumonia such as COVID-19. 3. Hepatic steatosis. 4.  Aortic Atherosclerosis (ICD10-I70.0). Electronically Signed   By: Randa Ngo M.D.   On: 01/21/2021 23:57      Rudene Re, MD 01/22/21 [phone]

## 2021-01-22 NOTE — ED Notes (Signed)
RN got pt up and ambulated pt. Pts oxygen dropped to 91% and after sitting down and taking a deep breath, oxygen went up to 95%, all on room air. Pts respiratory rate went to 26 while walking. ER provider notified.

## 2021-01-23 ENCOUNTER — Ambulatory Visit (HOSPITAL_COMMUNITY)
Admission: RE | Admit: 2021-01-23 | Discharge: 2021-01-23 | Disposition: A | Discharge status: Home / Self Care | Payer: Medicare Other | Source: Ambulatory Visit | Attending: Pulmonary Disease | Admitting: Pulmonary Disease

## 2021-01-23 DIAGNOSIS — U071 COVID-19: Secondary | ICD-10-CM | POA: Diagnosis not present

## 2021-01-23 DIAGNOSIS — C50912 Malignant neoplasm of unspecified site of left female breast: Secondary | ICD-10-CM | POA: Diagnosis not present

## 2021-01-23 DIAGNOSIS — Z6835 Body mass index (BMI) 35.0-35.9, adult: Secondary | ICD-10-CM | POA: Insufficient documentation

## 2021-01-23 MED ORDER — SODIUM CHLORIDE 0.9 % IV SOLN: INTRAVENOUS | Status: DC | PRN

## 2021-01-23 MED ORDER — FAMOTIDINE IN NACL 20-0.9 MG/50ML-% IV SOLN: 20.0000 mg | Freq: Once | INTRAVENOUS | Status: DC | PRN

## 2021-01-23 MED ORDER — EPINEPHRINE 0.3 MG/0.3ML IJ SOAJ: 0.3000 mg | Freq: Once | INTRAMUSCULAR | Status: DC | PRN

## 2021-01-23 MED ORDER — METHYLPREDNISOLONE SODIUM SUCC 125 MG IJ SOLR: 125.0000 mg | Freq: Once | INTRAMUSCULAR | Status: DC | PRN

## 2021-01-23 MED ORDER — DIPHENHYDRAMINE HCL 50 MG/ML IJ SOLN: 50.0000 mg | Freq: Once | INTRAMUSCULAR | Status: DC | PRN

## 2021-01-23 MED ORDER — ALBUTEROL SULFATE HFA 108 (90 BASE) MCG/ACT IN AERS: 2.0000 | INHALATION_SPRAY | Freq: Once | RESPIRATORY_TRACT | Status: DC | PRN

## 2021-01-23 MED ORDER — SOTROVIMAB 500 MG/8ML IV SOLN
500.0000 mg | Freq: Once | INTRAVENOUS | Status: AC
  Administered 2021-01-23: 500 mg via INTRAVENOUS

## 2021-01-23 NOTE — Progress Notes (Signed)
Diagnosis: COVID-19  Physician: Dr. Patrick Wright  Procedure: Covid Infusion Clinic Med: Sotrovimab infusion - Provided patient with sotrovimab fact sheet for patients, parents, and caregivers prior to infusion.   Complications: No immediate complications noted  Discharge: Discharged home    

## 2021-01-23 NOTE — Progress Notes (Signed)
Patient reviewed Fact Sheet for Patients, Parents, and Caregivers for Emergency Use Authorization (EUA) of sotrovimab for the Treatment of Coronavirus. Patient also reviewed and is agreeable to the estimated cost of treatment. Patient is agreeable to proceed.   

## 2021-01-23 NOTE — Discharge Instructions (Signed)

## 2021-01-26 ENCOUNTER — Other Ambulatory Visit: Payer: Self-pay | Admitting: Family Medicine

## 2021-02-09 ENCOUNTER — Other Ambulatory Visit: Payer: Self-pay | Admitting: Family Medicine

## 2021-02-09 NOTE — Telephone Encounter (Signed)
Requested Prescriptions  Pending Prescriptions Disp Refills  . levothyroxine (SYNTHROID) 75 MCG tablet [Pharmacy Med Name: L-THYROXINE (SYNTHROID) TABS 75MCG] 90 tablet 3    Sig: TAKE 1 TABLET DAILY     Endocrinology:  Hypothyroid Agents Failed - 02/09/2021 12:42 AM      Failed - TSH needs to be rechecked within 3 months after an abnormal result. Refill until TSH is due.      Passed - TSH in normal range and within 360 days    TSH  Date Value Ref Range Status  07/15/2020 0.925 0.450 - 4.500 uIU/mL Final         Passed - Valid encounter within last 12 months    Recent Outpatient Visits          3 weeks ago [place], [place]   2 months ago Sinusitis, unspecified chronicity, unspecified location   [facility] Fl[facility], Kelby Aline, FNP   4 months ago Urinary tract infection without hematuria, site unspecified   [place], PA-C   5 months ago Urinary tract infection without hematuria, site unspecified   [facility], Kelby Aline, FNP   6 months ago Essential hypertension   [facility] Birdie Sons, MD      Future Appointments            In 5 months [place] Ormond, Kathlene November, MD [facility]

## 2021-02-23 ENCOUNTER — Encounter: Payer: Self-pay | Admitting: Family Medicine

## 2021-02-23 ENCOUNTER — Ambulatory Visit (INDEPENDENT_AMBULATORY_CARE_PROVIDER_SITE_OTHER): Payer: Medicare Other | Admitting: Family Medicine

## 2021-02-23 ENCOUNTER — Other Ambulatory Visit: Payer: Self-pay

## 2021-02-23 ENCOUNTER — Ambulatory Visit
Admission: RE | Admit: 2021-02-23 | Discharge: 2021-02-23 | Disposition: A | Discharge status: Home / Self Care | Payer: Medicare Other | Source: Ambulatory Visit | Attending: Family Medicine | Admitting: Family Medicine

## 2021-02-23 VITALS — BP 156/83 | HR 76 | Temp 98.1°F | Resp 16 | Wt 303.6 lb

## 2021-02-23 DIAGNOSIS — E039 Hypothyroidism, unspecified: Secondary | ICD-10-CM | POA: Diagnosis not present

## 2021-02-23 DIAGNOSIS — R609 Edema, unspecified: Secondary | ICD-10-CM

## 2021-02-23 DIAGNOSIS — R0609 Other forms of dyspnea: Secondary | ICD-10-CM

## 2021-02-23 DIAGNOSIS — I10 Essential (primary) hypertension: Secondary | ICD-10-CM | POA: Diagnosis not present

## 2021-02-23 DIAGNOSIS — R0600 Dyspnea, unspecified: Secondary | ICD-10-CM

## 2021-02-23 DIAGNOSIS — U071 COVID-19: Secondary | ICD-10-CM | POA: Insufficient documentation

## 2021-02-23 DIAGNOSIS — R0602 Shortness of breath: Secondary | ICD-10-CM | POA: Diagnosis not present

## 2021-02-23 HISTORY — DX: COVID-19: U07.1

## 2021-02-23 MED ORDER — TRIAMTERENE-HCTZ 37.5-25 MG PO TABS: 1.0000 | ORAL_TABLET | Freq: Every day | ORAL | 3 refills | Status: DC

## 2021-02-23 NOTE — Progress Notes (Addendum)
Established patient visit   Patient: Kaylee Reyes   DOB: 29-Jun-1951   70 y.o. Female  MRN: [id number] Visit Date: 02/23/2021  Today's healthcare provider: Lelon Huh, MD   Chief Complaint  Patient presents with   Follow-up   Subjective    HPI HPI    Patient presents in office today for one month follow up after being diagnosed in ED for Covid-19 on 01/21/21. Patient reports that she still is suffering with shortness of breath on exertion and states that she has noticed and increased HR since being diagnosed. Patient states that after she had infusion she states symptoms seemed to improve but she reports swelling in her lower legs and dyspnea. Patient would like to discuss today getting a ultrasound of her heart, she states that she feels that it might be related to her shortness of breath.    Last edited by Minette Headland, CMA on 02/23/2021  8:16 AM. (History)      Home oximetry has been 95-96 consistently after infusion.  She does have albuterol inhaler that she has been using about 2 times a day.     Allergies  Allergen Reactions   Penicillins Shortness Of Breath    Has patient had a PCN reaction causing immediate rash, facial/tongue/throat swelling, SOB or lightheadedness with hypotension: Yes Has patient had a PCN reaction causing severe rash involving mucus membranes or skin necrosis: No Has patient had a PCN reaction that required hospitalization No Has patient had a PCN reaction occurring within the last 10 years: No If all of the above answers are "NO", then may proceed with Cephalosporin use.    Nickel Hives   Keflex [Cephalexin] Swelling    Throat swelling   Pravastatin Sodium Other (See Comments)    Myalgia        Medications: Outpatient Medications Prior to Visit  Medication Sig   albuterol (VENTOLIN HFA) 108 (90 Base) MCG/ACT inhaler Inhale 2 puffs into the lungs every 6 (six) hours as needed for wheezing or shortness of breath.    atenolol (TENORMIN) 50 MG tablet TAKE 1 TABLET DAILY   B Complex-C (SUPER B COMPLEX PO) Take 1 tablet by mouth daily.   CALCIUM CITRATE PO Take 1 tablet by mouth daily.   Carboxymeth-Glycerin-Polysorb (REFRESH OPTIVE MEGA-3 OP) Place 1 drop into both eyes daily.   docusate sodium (COLACE) 100 MG capsule Take 200 mg by mouth daily.    ELDERBERRY PO Take by mouth daily.    fexofenadine (ALLEGRA) 180 MG tablet Take 180 mg by mouth daily.   hydrochlorothiazide (HYDRODIURIL) 25 MG tablet TAKE 1 TABLET DAILY   ibuprofen (ADVIL,MOTRIN) 200 MG tablet Take 400 mg by mouth every 6 (six) hours as needed for mild pain.    ketoconazole (NIZORAL) 2 % cream Apply 1 application topically daily.   lansoprazole (PREVACID) 30 MG capsule TAKE 1 CAPSULE DAILY AT 12 NOON (NEED OFFICE VISIT PRIOR TO FURTHER REFILLS)   letrozole (FEMARA) 2.5 MG tablet TAKE 1 TABLET BY MOUTH EVERY DAY   levothyroxine (SYNTHROID) 75 MCG tablet TAKE 1 TABLET DAILY   magic mouthwash SOLN 1 part 2% viscous lidocaine: 1 part Maalox: 1 part diphenhydramine 12.5 mg per 5 ml elixir. Rinse and spit 5 ml every six hour as needed   Magnesium 400 MG TABS Take 600 mg by mouth daily.    meclizine (ANTIVERT) 12.5 MG tablet Take 1 tablet (12.5 mg total) by mouth 3 (three) times daily as needed for dizziness.  Misc Natural Products (TURMERIC CURCUMIN) CAPS Take 1 capsule by mouth daily.   nystatin (MYCOSTATIN/NYSTOP) powder Apply 1 application topically 3 (three) times daily.   sodium chloride (OCEAN) 0.65 % SOLN nasal spray Place 1 spray into both nostrils as needed for congestion.   triamcinolone cream (KENALOG) 0.5 % Apply 1 application topically 2 (two) times daily as needed. For itching   Vitamin D, Ergocalciferol, (DRISDOL) 1.25 MG (50000 UNIT) CAPS capsule TAKE 1 CAPSULE BY MOUTH 1 TIME A WEEK   [DISCONTINUED] albuterol (VENTOLIN HFA) 108 (90 Base) MCG/ACT inhaler INHALE 2 PUFFS BY MOUTH EVERY 6 HOURS AS NEEDED WHEEZING OR  SHORTNESS OF BREATH   [DISCONTINUED] nitrofurantoin, macrocrystal-monohydrate, (MACROBID) 100 MG capsule Take 1 capsule (100 mg total) by mouth 2 (two) times daily.   No facility-administered medications prior to visit.    Review of Systems  {Labs   Heme   Chem   Endocrine   Serology   Results Review (optional):[id number]::" "}   Objective    BP (!) 156/83    Pulse 76    Temp 98.1 F (36.7 C) (Oral)    Resp 16    Wt (!) 303 lb 9.6 oz (137.7 kg)    SpO2 96%    BMI 49.00 kg/m     Physical Exam    General: Appearance:    Severely obese female in no acute distress  Eyes:    PERRL, conjunctiva/corneas clear, EOM's intact       Lungs:     Clear to auscultation bilaterally, respirations unlabored  Heart:    Normal heart rate. Normal rhythm. No murmurs, rubs, or gallops. 2+ edema.    MS:   All extremities are intact.   Neurologic:   Awake, alert, oriented x 3. No apparent focal neurological           defect.         Assessment & Plan     1. 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 Improved, but still DOE requiring albuterol BID - DG Chest 2 View; Future  2. Dyspnea on exertion  - CBC - Comprehensive metabolic panel - Brain natriuretic peptide - DG Chest 2 View; Future  3. Edema, unspecified type previously intermittent, but persistent since having had Covid in February.   4. Primary hypertension BP not at goal. Change hct to triamterene-hctz.   5. Hypothyroidism, unspecified type  - TSH        The entirety of the information documented in the History of Present Illness, Review of Systems and Physical Exam were personally obtained by me. Portions of this information were initially documented by the CMA and reviewed by me for thoroughness and accuracy.      Lelon Huh, MD  [facility] [phone] (phone) [phone] (fax)  [place]

## 2021-02-23 NOTE — Patient Instructions (Addendum)
Go to the [facility] on [facility] for chest [place]  . Having had Covid gives you about the same amount of immunity as one dose of the Covid vaccine. You should get a dose of the Covid Vaccine around the end of April.    If your labs are all normal then I'm going to change your blood pressure medication to trimterene-hctz whicih should get your systolic blood pressure consistently in the 130s.

## 2021-02-24 ENCOUNTER — Telehealth: Payer: Self-pay | Admitting: Family Medicine

## 2021-02-24 ENCOUNTER — Other Ambulatory Visit: Payer: Self-pay | Admitting: Family Medicine

## 2021-02-24 DIAGNOSIS — K760 Fatty (change of) liver, not elsewhere classified: Secondary | ICD-10-CM

## 2021-02-24 DIAGNOSIS — R7401 Elevation of levels of liver transaminase levels: Secondary | ICD-10-CM

## 2021-02-24 LAB — COMPREHENSIVE METABOLIC PANEL
ALT: 60 IU/L — ABNORMAL HIGH (ref 0–32)
AST: 62 IU/L — ABNORMAL HIGH (ref 0–40)
Albumin/Globulin Ratio: 1.4 (ref 1.2–2.2)
Albumin: 4.2 g/dL (ref 3.8–4.8)
Alkaline Phosphatase: 105 IU/L (ref 44–121)
BUN/Creatinine Ratio: 13 (ref 12–28)
BUN: 10 mg/dL (ref 8–27)
Bilirubin Total: 0.3 mg/dL (ref 0.0–1.2)
CO2: 24 mmol/L (ref 20–29)
Calcium: 9.6 mg/dL (ref 8.7–10.3)
Chloride: 100 mmol/L (ref 96–106)
Creatinine, Ser: 0.78 mg/dL (ref 0.57–1.00)
Globulin, Total: 3.1 g/dL (ref 1.5–4.5)
Glucose: 124 mg/dL — ABNORMAL HIGH (ref 65–99)
Potassium: 4.2 mmol/L (ref 3.5–5.2)
Sodium: 140 mmol/L (ref 134–144)
Total Protein: 7.3 g/dL (ref 6.0–8.5)
eGFR: 83 mL/min/{1.73_m2} (ref >59)

## 2021-02-24 LAB — CBC
Hematocrit: 36 % (ref 34.0–46.6)
Hemoglobin: 11.9 g/dL (ref 11.1–15.9)
MCH: 28.8 pg (ref 26.6–33.0)
MCHC: 33.1 g/dL (ref 31.5–35.7)
MCV: 87 fL (ref 79–97)
Platelets: 175 10*3/uL (ref 150–450)
RBC: 4.13 x10E6/uL (ref 3.77–5.28)
RDW: 13.9 % (ref 11.7–15.4)
WBC: 6.8 10*3/uL (ref 3.4–10.8)

## 2021-02-24 LAB — SPECIMEN STATUS REPORT

## 2021-02-24 LAB — BRAIN NATRIURETIC PEPTIDE: BNP: 17.3 pg/mL (ref 0.0–100.0)

## 2021-02-24 LAB — TSH: TSH: 2.95 u[IU]/mL (ref 0.450–4.500)

## 2021-02-24 NOTE — Telephone Encounter (Signed)
Patient would like the nurse to call her regarding her lab results she saw on My Chart.  She stated she needed someone to explain and go over results with her.  CB# [phone]

## 2021-02-24 NOTE — Telephone Encounter (Signed)
There are still results that are pending.

## 2021-02-24 NOTE — Telephone Encounter (Signed)
Lab results in patient's chart awaiting review from [place]. Please review and advise.

## 2021-02-26 ENCOUNTER — Ambulatory Visit: Payer: Self-pay | Admitting: *Deleted

## 2021-02-26 ENCOUNTER — Other Ambulatory Visit: Payer: Self-pay | Admitting: Family Medicine

## 2021-02-26 NOTE — Telephone Encounter (Signed)
Patient transferred to NT and systems failure noted and unable to hear patient. Attempted to call patient back to review symptoms and unable to connect with patient.

## 2021-02-26 NOTE — Telephone Encounter (Signed)
Patient is reporting swelling in ankles/feet and some SOB. Patient has recent chest x-ray and labs- good results. Today O2 sat level 91- she does take HCTZ - medication not enough to remove fluid. PCP has been prescribed new medication- but she has not started.Patient reports she has an appointment on Monday- but is concerned about her O2 sat level and the swelling. During triage call- patient put me on hold to take an incoming call- after 5 minutes- patient never came back- call disconnected. Attempted call back- busy signal.

## 2021-02-26 NOTE — Telephone Encounter (Signed)
Attempted to contact patient and unable to hear patient . Will f/u on patient c/o via anohter NT.

## 2021-02-26 NOTE — Telephone Encounter (Signed)
Pt reports edema both ankles and feet since having covid 01/21/21. States remains the same, has not worsened. Denies redness, warmth, "Legs cramp at night at times." Reports O2 sats at times drop to 91%, does inhaler, up to 93-94%. During call O2 sat 97% and HR 78. Pt questioning if this drop along with edema is cause for concern.  Also reports new BP med (Triamterene-HCTZ)  Was discussed at [place]. States was called in but she hasn't picked up yet as "Dr. Caryn Section wanted to view my labs results first and they were not back. I'm not sure he wants me to take it."  Also questioning if this new med would help with edema. Pt would like Dr. Caryn Section to know she had [place] scheduled for [facility] 02/20/21  Assured NT would route to practice for Dr. Maralyn Sago review.  Please advise: [phone]  Reason for Disposition . [1] MILD swelling of both ankles (i.e., pedal edema) AND [2] new-onset or worsening  Answer Assessment - Initial Assessment Questions 1. ONSET: "When did the swelling start?" (e.g., minutes, hours, days)     Since covid, 01/21/21 2. LOCATION: "What part of the leg is swollen?"  "Are both legs swollen or just one leg?"      3. SEVERITY: "How bad is the swelling?" (e.g., localized; mild, moderate, severe)  - Localized - small area of swelling localized to one leg  - MILD pedal edema - swelling limited to foot and ankle, pitting edema < 1/4 inch (6 mm) deep, rest and elevation eliminate most or all swelling  - MODERATE edema - swelling of lower leg to knee, pitting edema > 1/4 inch (6 mm) deep, rest and elevation only partially reduce swelling  - SEVERE edema - swelling extends above knee, facial or hand swelling present      I one inch more left ankle  Right ankle 2 inches, top of feet are puffy 4. REDNESS: "Does the swelling look red or infected?"     no 5. PAIN: "Is the swelling painful to touch?" If Yes, ask: "How painful is it?"   (Scale 1-10; mild, moderate or severe)     Pain left mostly, mild. 6.  FEVER: "Do you have a fever?" If Yes, ask: "What is it, how was it measured, and when did it start?"      no 7. CAUSE: "What do you think is causing the leg swelling?"     Unsure 8. MEDICAL HISTORY: "Do you have a history of heart failure, kidney disease, liver failure, or cancer?"     *No Answer* 9. RECURRENT SYMPTOM: "Have you had leg swelling before?" If Yes, ask: "When was the last time?" "What happened that time?"     *No Answer* 10. OTHER SYMPTOMS: "Do you have any other symptoms?" (e.g., chest pain, difficulty breathing)       Mild SOB at times. O2 sat 91%  Did inhaler, up to 94%. Does go down to 93-94 when up and about.  Protocols used: LEG SWELLING AND EDEMA-A-AH

## 2021-02-26 NOTE — Telephone Encounter (Signed)
Requested Prescriptions  Pending Prescriptions Disp Refills   atenolol (TENORMIN) 50 MG tablet [Pharmacy Med Name: ATENOLOL TABS 50MG ] 90 tablet 1    Sig: TAKE 1 TABLET DAILY     Cardiovascular:  Beta Blockers Failed - 02/26/2021 12:44 AM      Failed - Last BP in normal range    BP Readings from Last 1 Encounters:  02/23/21 (!) 156/83         Passed - Last Heart Rate in normal range    Pulse Readings from Last 1 Encounters:  02/23/21 76         Passed - Valid encounter within last 6 months    Recent Outpatient Visits          3 days ago [place], Kirstie Peri, MD   1 month ago [place], Clearnce [place], [place]   2 months ago Sinusitis, unspecified chronicity, unspecified location   [facility] Flinchum, Kelby Aline, FNP   5 months ago Urinary tract infection without hematuria, site unspecified   [place], PA-C   5 months ago Urinary tract infection without hematuria, site unspecified   [facility], Kelby Aline, FNP      Future Appointments            In 4 months Grayland Ormond, Kathlene November, [facility]            hydrochlorothiazide (HYDRODIURIL) 25 MG tablet [Pharmacy Med Name: HYDROCHLOROTHIAZIDE TABS 25MG ] 90 tablet 3    Sig: TAKE 1 TABLET DAILY     Cardiovascular: Diuretics - Thiazide Failed - 02/26/2021 12:44 AM      Failed - Last BP in normal range    BP Readings from Last 1 Encounters:  02/23/21 (!) 156/83         Passed - Ca in normal range and within 360 days    Calcium  Date Value Ref Range Status  02/23/2021 9.6 8.7 - 10.3 mg/dL Final   Calcium, Total  Date Value Ref Range Status  07/15/2013 8.2 (L) 8.5 - 10.1 mg/dL Final         Passed - Cr in normal range and within 360 days    Creatinine  Date Value Ref Range Status  07/15/2013 0.98 0.60 - 1.30 mg/dL Final   Creatinine,  Ser  Date Value Ref Range Status  02/23/2021 0.78 0.57 - 1.00 mg/dL Final         Passed - K in normal range and within 360 days    Potassium  Date Value Ref Range Status  02/23/2021 4.2 3.5 - 5.2 mmol/L Final  07/15/2013 4.1 3.5 - 5.1 mmol/L Final         Passed - Na in normal range and within 360 days    Sodium  Date Value Ref Range Status  02/23/2021 140 134 - 144 mmol/L Final  07/15/2013 136 136 - 145 mmol/L Final         Passed - Valid encounter within last 6 months    Recent Outpatient Visits          3 days ago [place], Donald E, MD   1 month ago [place], [place]   2 months ago Sinusitis, unspecified chronicity, unspecified location   [place], Kelby Aline, FNP   5 months ago  Urinary tract infection without hematuria, site unspecified   [place], [place]   5 months ago Urinary tract infection without hematuria, site unspecified   [facility], Kelby Aline, FNP      Future Appointments            In 4 months Grayland Ormond, Kathlene November, MD [facility]

## 2021-02-27 ENCOUNTER — Emergency Department
Admission: EM | Admit: 2021-02-27 | Discharge: 2021-02-27 | Disposition: A | Discharge status: Home / Self Care | Payer: Medicare Other | Attending: Emergency Medicine | Admitting: Emergency Medicine

## 2021-02-27 ENCOUNTER — Emergency Department: Payer: Medicare Other

## 2021-02-27 ENCOUNTER — Telehealth: Payer: Self-pay

## 2021-02-27 ENCOUNTER — Other Ambulatory Visit: Payer: Self-pay

## 2021-02-27 DIAGNOSIS — I10 Essential (primary) hypertension: Secondary | ICD-10-CM | POA: Insufficient documentation

## 2021-02-27 DIAGNOSIS — R600 Localized edema: Secondary | ICD-10-CM | POA: Diagnosis not present

## 2021-02-27 DIAGNOSIS — Z853 Personal history of malignant neoplasm of breast: Secondary | ICD-10-CM | POA: Insufficient documentation

## 2021-02-27 DIAGNOSIS — R0600 Dyspnea, unspecified: Secondary | ICD-10-CM | POA: Diagnosis not present

## 2021-02-27 DIAGNOSIS — R21 Rash and other nonspecific skin eruption: Secondary | ICD-10-CM | POA: Diagnosis not present

## 2021-02-27 DIAGNOSIS — E039 Hypothyroidism, unspecified: Secondary | ICD-10-CM | POA: Insufficient documentation

## 2021-02-27 DIAGNOSIS — Z79899 Other long term (current) drug therapy: Secondary | ICD-10-CM | POA: Diagnosis not present

## 2021-02-27 DIAGNOSIS — R0602 Shortness of breath: Secondary | ICD-10-CM | POA: Insufficient documentation

## 2021-02-27 DIAGNOSIS — R0609 Other forms of dyspnea: Secondary | ICD-10-CM | POA: Insufficient documentation

## 2021-02-27 DIAGNOSIS — M7989 Other specified soft tissue disorders: Secondary | ICD-10-CM | POA: Diagnosis present

## 2021-02-27 DIAGNOSIS — Z8616 Personal history of COVID-19: Secondary | ICD-10-CM | POA: Insufficient documentation

## 2021-02-27 LAB — CBC
HCT: 35.4 % — ABNORMAL LOW (ref 36.0–46.0)
Hemoglobin: 11.5 g/dL — ABNORMAL LOW (ref 12.0–15.0)
MCH: 28.7 pg (ref 26.0–34.0)
MCHC: 32.5 g/dL (ref 30.0–36.0)
MCV: 88.3 fL (ref 80.0–100.0)
Platelets: 164 10*3/uL (ref 150–400)
RBC: 4.01 MIL/uL (ref 3.87–5.11)
RDW: 14.4 % (ref 11.5–15.5)
WBC: 6.2 10*3/uL (ref 4.0–10.5)
nRBC: 0 % (ref 0.0–0.2)

## 2021-02-27 LAB — COMPREHENSIVE METABOLIC PANEL
ALT: 66 U/L — ABNORMAL HIGH (ref 0–44)
AST: 73 U/L — ABNORMAL HIGH (ref 15–41)
Albumin: 4.2 g/dL (ref 3.5–5.0)
Alkaline Phosphatase: 90 U/L (ref 38–126)
Anion gap: 9 (ref 5–15)
BUN: 14 mg/dL (ref 8–23)
CO2: 27 mmol/L (ref 22–32)
Calcium: 9.5 mg/dL (ref 8.9–10.3)
Chloride: 101 mmol/L (ref 98–111)
Creatinine, Ser: 0.81 mg/dL (ref 0.44–1.00)
GFR, Estimated: >60 mL/min (ref >60)
Glucose, Bld: 130 mg/dL — ABNORMAL HIGH (ref 70–99)
Potassium: 3.6 mmol/L (ref 3.5–5.1)
Sodium: 137 mmol/L (ref 135–145)
Total Bilirubin: 0.8 mg/dL (ref 0.3–1.2)
Total Protein: 8.1 g/dL (ref 6.5–8.1)

## 2021-02-27 LAB — TROPONIN I (HIGH SENSITIVITY): Troponin I (High Sensitivity): 6 ng/L (ref <18)

## 2021-02-27 NOTE — Telephone Encounter (Signed)
Copied from [place] [id number]. Topic: General - Other >> Feb 27, 2021 10:43 AM Tessa Lerner A wrote: Patient would like a prescription for 1 year supply of support knee highs Strength 15-20 sent to   [facility] [address], [place], [place] [address] [phone]  Please contact further if needed

## 2021-02-27 NOTE — ED Triage Notes (Signed)
Pt in with co shortness of breath and peripheral edema for a week. States had covid last month and has been checking O2 sats since. States sats at home went down to 87% so she decided to come in.

## 2021-02-27 NOTE — Telephone Encounter (Signed)
Copied from [place] [id number]. Topic: General - Other >> Feb 27, 2021 10:50 AM Tessa Lerner A wrote: Reason for CRM: Patient made contact to notify PCP of their appointment with a cardiologist at [facility] 03/10/21 at 9:45 AM Please contact further if needed

## 2021-02-27 NOTE — Telephone Encounter (Signed)
Pt has called back asking if she can start the new fluid/ bp pill now.  She said Dr. Caryn Section changed the medication Monday and she said he wanted to get the lab results back before she starts taking it.  CB#  [phone]

## 2021-02-27 NOTE — ED Provider Notes (Signed)
[facility] Emergency Department Provider Note  ____________________________________________  Time seen: Approximately 4:29 AM  I have reviewed the triage vital signs and the nursing notes.   HISTORY  Chief Complaint Shortness of Breath and Edema   HPI Kaylee Reyes is a 70 y.o. female with a history of hypothyroidism, hypertension, hyperlipidemia, and COVID 5 weeks ago who presents for evaluation of bilateral lower extremity swelling and shortness of breath.  Patient reports that she fully recovered from [place].  She received Mab infusions right after her diagnosis.  She was told when she had COVID to check her oxygen for any signs of hypoxia.  Patient continues to check her oxygen on a daily basis.  She reports that today she check her oxygen and went down to 87% very briefly but then went back up to the 90s.  She denies feeling shortness of breath during this episode.  She does report that some days she will have mild shortness of breath with exertion or wake up in the morning with a little bit of shortness of breath and that has been on and off since she had Covid.  She denies any shortness of breath today.  She also noticed intermittent swelling of bilateral lower extremities that she has had since tested positive for Covid.  She reports usually the swelling is better in the morning and as the day goes on it becomes more pronounced.  She denies unintentional weight gain, history of CHF or CAD, chest pain, leg pain, history of PE or DVT.  She went to see her primary care doctor this week who ran a full set of labs which were all unremarkable.  He reports that he is going to start her on a small dose diuretic for the leg swelling.  Past Medical History:  Diagnosis Date  . Anemia   . Body tinea 10/01/2015   Previously treated by Dr. Koleen Nimrod   . GERD (gastroesophageal reflux disease)   . History of breast cancer 09/2015   breast cancer  . History of chicken  pox   . History of peptic ulcer disease 09/25/2015  . Hyperlipidemia   . Hypertension   . Hypothyroidism   . Neuropathy of foot    bilateral  . Pinched nerve   . PONV (postoperative nausea and vomiting)    exploratory surgery at [facility]  . Thyroid disease   . Tuberculosis exposure    Mother had TB.  Pt shows (+) on tests, but has never had.  . Vertigo    last episode over 1 yr ago  . Wears dentures    partial upper and lower    Patient Active Problem List   Diagnosis Date Noted  . COVID-19 02/23/2021  . Morbid obesity ([place]) 08/06/2019  . Menopause 03/23/2017  . Rectocele 03/23/2017  . Cystocele, midline 03/23/2017  . Vaginal atrophy 03/23/2017  . Left breast cancer with T3 tumor, >5 cm in greatest dimension ([place])   . S/P bilateral mastectomy 10/28/2015  . Fatty infiltration of liver 10/01/2015  . Gonalgia 10/01/2015  . Invasive lobular carcinoma of right breast, stage 2 ([place]) 10/01/2015  . Hypothyroidism 09/25/2015  . Adult BMI 35.0-35.9 kg/sq m 09/25/2015  . Hypercholesterolemia 09/25/2015  . Hypertension 09/25/2015  . Allergic rhinitis 09/25/2015  . GERD (gastroesophageal reflux disease) 09/25/2015  . Alopecia 09/25/2015  . Eczema 09/25/2015    Past Surgical History:  Procedure Laterality Date  . ABDOMINAL HYSTERECTOMY  07/13/2013   Total. with BSO for postmenopausal bleeding  and fibroids with large cervical polyp. Dr. Levy Sjogren and Dr. Glennon Mac at [facility]  . ABDOMINAL ULTRASOUND  04/17/2011   Hepatomegaly with borderline slenomegaly. Suggestive of fatty liver. s/p cholecystectomy. Portions of aorta obscured  . BREAST SURGERY Right 2011   BREAST BIOPSY  . CESAREAN SECTION  1988  . CHOLECYSTECTOMY  1990  . COLONOSCOPY WITH PROPOFOL N/A 09/01/2017   Procedure: COLONOSCOPY WITH PROPOFOL;  Surgeon: [name], MD;  Location: [facility];  Service: Gastroenterology;  Laterality: N/A;  . DIAGNOSTIC LAPAROSCOPY    . ESOPHAGOGASTRODUODENOSCOPY (EGD) WITH PROPOFOL N/A  01/17/2020   Procedure: ESOPHAGOGASTRODUODENOSCOPY (EGD) WITH PROPOFOL;  Surgeon: [name], MD;  Location: [facility] ENDOSCOPY;  Service: Endoscopy;  Laterality: N/A;  . EXPLORATORY LAPAROTOMY     Left breast  . MASTECTOMY    . MASTECTOMY W/ SENTINEL NODE BIOPSY Bilateral 10/28/2015   Procedure: MASTECTOMY WITH SENTINEL LYMPH NODE BIOPSY;  Surgeon: Hubbard Robinson, MD;  Location: [facility] ORS;  Service: General;  Laterality: Bilateral;  . PORT-A-CATH REMOVAL Left 10/11/2016   Procedure: REMOVAL PORT-A-CATH;  Surgeon: Hubbard Robinson, MD;  Location: [facility] ORS;  Service: General;  Laterality: Left;  . PORTACATH PLACEMENT Left 12/23/2015   Procedure: INSERTION PORT-A-CATH;  Surgeon: Hubbard Robinson, MD;  Location: [facility] ORS;  Service: General;  Laterality: Left;  . WRIST SURGERY      Prior to Admission medications   Medication Sig Start Date End Date Taking? Authorizing Provider  albuterol (VENTOLIN HFA) 108 (90 Base) MCG/ACT inhaler Inhale 2 puffs into the lungs every 6 (six) hours as needed for wheezing or shortness of breath. 01/22/21   Rudene Re, MD  atenolol (TENORMIN) 50 MG tablet TAKE 1 TABLET DAILY 02/26/21   Birdie Sons, MD  B Complex-C (SUPER B COMPLEX PO) Take 1 tablet by mouth daily.    [provider]  CALCIUM CITRATE PO Take 1 tablet by mouth daily.    [provider]  Carboxymeth-Glycerin-Polysorb (REFRESH OPTIVE MEGA-3 OP) Place 1 drop into both eyes daily.    [provider]  docusate sodium (COLACE) 100 MG capsule Take 200 mg by mouth daily.     [provider]  ELDERBERRY PO Take by mouth daily.     [provider]  fexofenadine (ALLEGRA) 180 MG tablet Take 180 mg by mouth daily.    [provider]  ibuprofen (ADVIL,MOTRIN) 200 MG tablet Take 400 mg by mouth every 6 (six) hours as needed for mild pain.     [provider]  ketoconazole (NIZORAL) 2 % cream Apply 1 application topically daily. 09/22/20    Mar Daring, PA-C  lansoprazole (PREVACID) 30 MG capsule TAKE 1 CAPSULE DAILY AT 12 NOON (NEED OFFICE VISIT PRIOR TO FURTHER REFILLS) 01/26/21   Birdie Sons, MD  letrozole [facility]) 2.5 MG tablet TAKE 1 TABLET BY MOUTH EVERY DAY 09/22/20 07/04/21  Birdie Sons, MD  levothyroxine (SYNTHROID) 75 MCG tablet TAKE 1 TABLET DAILY 02/09/21   Birdie Sons, MD  magic mouthwash SOLN 1 part 2% viscous lidocaine: 1 part Maalox: 1 part diphenhydramine 12.5 mg per 5 ml elixir. Rinse and spit 5 ml every six hour as needed 09/22/20   Mar Daring, PA-C  Magnesium 400 MG TABS Take 600 mg by mouth daily.     [provider]  meclizine (ANTIVERT) 12.5 MG tablet Take 1 tablet (12.5 mg total) by mouth 3 (three) times daily as needed for dizziness. 01/02/21   Birdie Sons, MD  Misc Natural Products (TURMERIC CURCUMIN) CAPS Take 1 capsule by mouth daily.    [provider]  nystatin (MYCOSTATIN/NYSTOP) powder Apply 1 application topically 3 (three) times daily. 09/22/20   Mar Daring, PA-C  sodium chloride (OCEAN) 0.65 % SOLN nasal spray Place 1 spray into both nostrils as needed for congestion. 12/04/20   Flinchum, Kelby Aline, FNP  triamcinolone cream (KENALOG) 0.5 % Apply 1 application topically 2 (two) times daily as needed. For itching 09/17/19   Birdie Sons, MD  triamterene-hydrochlorothiazide (MAXZIDE-25) 37.5-25 MG tablet Take 1 tablet by mouth daily. TAKE IN PLACE OF HYDROCHLOROTHIAZIDE 02/23/21   Birdie Sons, MD  Vitamin D, Ergocalciferol, (DRISDOL) 1.25 MG (50000 UNIT) CAPS capsule TAKE 1 CAPSULE BY MOUTH 1 TIME A WEEK 12/23/20   Lloyd Huger, MD  fluticasone (FLONASE) 50 MCG/ACT nasal spray Place 2 sprays into both nostrils daily. 01/21/21 01/21/21  Rodriguez-Southworth, Sunday Spillers, PA-C    Allergies Penicillins, Nickel, Keflex [cephalexin], and Pravastatin sodium  Family History  Problem Relation Age of Onset  . Hypertension Mother   .  Thyroid disease Mother   . Hypertension Brother   . Diabetes Brother   . Breast cancer Neg Hx   . Ovarian cancer Neg Hx   . Colon cancer Neg Hx     Social History Social History   Tobacco Use  . Smoking status: Never Smoker  . Smokeless tobacco: Never Used  Vaping Use  . Vaping Use: Never used  Substance Use Topics  . Alcohol use: No    Alcohol/week: 0.0 standard drinks  . Drug use: No    Review of Systems  Constitutional: Negative for fever. Eyes: Negative for visual changes. ENT: Negative for sore throat. Neck: No neck pain  Cardiovascular: Negative for chest pain. Respiratory: + shortness of breath. Gastrointestinal: Negative for abdominal pain, vomiting or diarrhea. Genitourinary: Negative for dysuria. Musculoskeletal: Negative for back pain. + b/l leg swelling Skin: Negative for rash. Neurological: Negative for headaches, weakness or numbness. Psych: No SI or HI  ____________________________________________   PHYSICAL EXAM:  VITAL SIGNS: Vitals:   02/27/21 0212 02/27/21 0400  BP: (!) 196/99 (!) 158/89  Pulse: 80 78  Resp: 18 12  Temp: 98.7 F (37.1 C)   SpO2: 95% 97%     Constitutional: Alert and oriented. Well appearing and in no apparent distress. HEENT:      Head: Normocephalic and atraumatic.         Eyes: Conjunctivae are normal. Sclera is non-icteric.       Mouth/Throat: Mucous membranes are moist.       Neck: Supple with no signs of meningismus. Cardiovascular: Regular rate and rhythm. No murmurs, gallops, or rubs. 2+ symmetrical distal pulses are present in all extremities. No JVD. Respiratory: Normal respiratory effort. Lungs are clear to auscultation bilaterally.  Gastrointestinal: Soft, non tender. Musculoskeletal:  Trace pitting edema bilaterally Neurologic: Normal speech and language. Face is symmetric. Moving all extremities. No gross focal neurologic deficits are appreciated. Skin: Skin is warm, dry and intact. No rash  noted. Psychiatric: Mood and affect are normal. Speech and behavior are normal.  ____________________________________________   LABS (all labs ordered are listed, but only abnormal results are displayed)  Labs Reviewed  CBC - Abnormal; Notable for the following components:      Result Value   Hemoglobin 11.5 (*)    HCT 35.4 (*)    All other components within normal limits  COMPREHENSIVE METABOLIC PANEL - Abnormal; Notable for the following  components:   Glucose, Bld 130 (*)    AST 73 (*)    ALT 66 (*)    All other components within normal limits  TROPONIN I (HIGH SENSITIVITY)  TROPONIN I (HIGH SENSITIVITY)   ____________________________________________  EKG  ED ECG REPORT I, Rudene Re, the attending physician, personally viewed and interpreted this ECG.  NSR, rate 80, normal intervals, normal axis, no ST elevations or depressions.  Normal EKG ____________________________________________  RADIOLOGY  I have personally reviewed the images performed during this visit and I agree with the Radiologist's read.   Interpretation by Radiologist:  DG Chest 2 View  Result Date: 02/27/2021 CLINICAL DATA:  70 year old female with shortness of breath. EXAM: CHEST - 2 VIEW COMPARISON:  Chest radiograph dated 02/23/2021. FINDINGS: No focal consolidation, pleural effusion or pneumothorax. There is mild chronic interstitial coarsening and bronchitic changes. Top-normal cardiac size. Atherosclerotic calcification of the aorta. Degenerative changes of the spine. No acute osseous pathology. IMPRESSION: No active cardiopulmonary disease. Electronically Signed   By: Anner Crete M.D.   On: 02/27/2021 02:47     ____________________________________________   PROCEDURES  Procedure(s) performed:yes .1-3 Lead EKG Interpretation Performed by: Rudene Re, MD Authorized by: Rudene Re, MD     Interpretation: normal     ECG rate assessment: normal     Rhythm: sinus  rhythm     Ectopy: none     Conduction: normal     Critical Care performed:  None ____________________________________________   INITIAL IMPRESSION / ASSESSMENT AND PLAN / ED COURSE  70 y.o. female with a history of hypothyroidism, hypertension, hyperlipidemia, and COVID 5 weeks ago who presents for evaluation of bilateral lower extremity swelling and shortness of breath.  Patient symptoms seem to be intermittent since she had Covid 5 weeks ago.  She endorses leg swelling that is usually worse in the evening and better in the morning.  She has no history of CHF, no unintentional weight gain.  Blood work done by her PCP this week and chest x-ray showing no signs of congestive heart failure with a normal BNP, normal troponin, normal EKG and chest x-ray.  Patient denies any current shortness of breath.  She reports that she was checking her oxygen today because she has been doing since she had Covid.  She does report that very rarely she will wake up in the morning with shortness of breath or when she is walking a little bit more than normal she will get a little short of breath and that started after Covid.  But no acute changes that brought her to the emergency room.  Her EKG here is normal.  Chest x-ray visualized by me with no signs of edema, confirmed by radiology.  Blood work showing mild stable anemia, no leukocytosis, no significant electrolyte derangements, mild elevated glucose which patient has had in the past.  Discussed this with her and recommended fasting glucose and hemoglobin A1c tested by PCP.  Troponin is negative.  Patient ambulated in the room and sats were 97 to 99%, lungs are clear with no wheezing or crackles.  At this time low suspicion for heart failure but since patient had Covid I will refer her back to cardiology for an echocardiogram.  It seems that patient's PCP is already starting her on a diuretic for the leg edema.  Most likely venous stasis. Low suspicion for DVT with  symmetric swelling that is intermittent, worse in the evening and resolved in the am.  Discussed my standard return precautions and  recommended return to the emergency room for chest pain or shortness of breath.  Old medical record reviewed including notes from patient's visit to the ED back on February and Mab infusion notes.       _____________________________________________ Please note:  Patient was evaluated in Emergency Department today for the symptoms described in the history of present illness. Patient was evaluated in the context of the global COVID-19 pandemic, which necessitated consideration that the patient might be at risk for infection with the SARS-CoV-2 virus that causes COVID-19. Institutional protocols and algorithms that pertain to the evaluation of patients at risk for COVID-19 are in a state of rapid change based on information released by regulatory bodies including the CDC and federal and state organizations. These policies and algorithms were followed during the patient's care in the ED.  Some ED evaluations and interventions may be delayed as a result of limited staffing during the pandemic.   [place] Controlled Substance Database was reviewed by me. ____________________________________________   FINAL CLINICAL IMPRESSION(S) / ED DIAGNOSES   Final diagnoses:  Dyspnea on exertion  Bilateral leg edema      NEW MEDICATIONS STARTED DURING THIS VISIT:  ED Discharge Orders    None       Note:  This document was prepared using Dragon voice recognition software and may include unintentional dictation errors.    Rudene Re, MD 02/27/21 [phone]

## 2021-02-27 NOTE — Discharge Instructions (Signed)
Follow up with Dr. Ubaldo Glassing for an ECHO of your heart.  Follow-up with your primary care doctor in 3 to 4 days for reevaluation.  As we discussed, your sugar was slightly elevated at 130.  Make sure to have fasting blood glucose rechecked by your PCP.  Return to the emergency room for chest pain or shortness of breath or leg pain.

## 2021-02-27 NOTE — Telephone Encounter (Signed)
Copied from [place] [id number]. Topic: General - Other >> Feb 26, 2021  4:18 PM Lenon Curt, Everette A wrote: Reason for CRM: Patient has noticed an increase in swelling in both of their legs and feels that they're retaining fluids  Patient would like to be prescribed something to help with fluid retention  Patient would like medication sent to [facility], [address]  Phone:  [phone]  Please contact to advise further if needed

## 2021-03-02 ENCOUNTER — Other Ambulatory Visit: Payer: Self-pay

## 2021-03-02 ENCOUNTER — Ambulatory Visit (HOSPITAL_COMMUNITY)
Admission: RE | Admit: 2021-03-02 | Discharge: 2021-03-02 | Disposition: A | Discharge status: Home / Self Care | Payer: Medicare Other | Source: Ambulatory Visit | Attending: Family Medicine | Admitting: Family Medicine

## 2021-03-02 DIAGNOSIS — R7401 Elevation of levels of liver transaminase levels: Secondary | ICD-10-CM | POA: Diagnosis not present

## 2021-03-02 DIAGNOSIS — K760 Fatty (change of) liver, not elsewhere classified: Secondary | ICD-10-CM | POA: Diagnosis not present

## 2021-03-02 DIAGNOSIS — R188 Other ascites: Secondary | ICD-10-CM | POA: Diagnosis not present

## 2021-03-03 ENCOUNTER — Telehealth: Payer: Self-pay | Admitting: Family Medicine

## 2021-03-03 ENCOUNTER — Telehealth: Payer: Self-pay

## 2021-03-03 NOTE — Telephone Encounter (Signed)
Copied from [place] [id number]. Topic: General - Inquiry >> Mar 03, 2021 10:38 AM Greggory Keen D wrote: Reason for CRM: Pt called asking if someone would call her and go over her [place] results.  She said she can see them but would like a better understanding.  CB# [phone]

## 2021-03-03 NOTE — Telephone Encounter (Signed)
Pt is calling and will need rx order for compression stocking 15-20 for a year supply and they will measure her . Please call [facility] [address] phone number is [phone]

## 2021-03-04 NOTE — Telephone Encounter (Addendum)
Patient advised. Blood Pressure follow up appointment scheduled. Patient is requesting a prescription for plain HCTZ 25MG . She says that since she is going to be taking the Maxide every other day, she wants to take a plain HCTZ on the days that she doesn't take Maxide. (alternating the 2 medications). She says she wants to prevent alterations in her potassium levels since she will be taking Maxide and following the DASH diet. Please advise on refill request.   Pharmacy: [facility]

## 2021-03-04 NOTE — Telephone Encounter (Signed)
That's fine. Need to check liver functions in 4-6 months. Need to schedule office visit in 2-3 months to check on blood pressure.

## 2021-03-04 NOTE — Telephone Encounter (Signed)
Order printed sent to medical records to fax

## 2021-03-04 NOTE — Telephone Encounter (Addendum)
Advised patient as below. Patient wants to hold off on GI referral at this time. She reports that she has started the DASH diet, and wants to see if this will help turn things around with her liver. She also reports that this diet contains a lot of fruits and veggies, and she wants to know will this affect her potassium levels? She reports that she was just started on the Maxzide and she wants to take that every other day because it can throw off her potassium. Please advise if patient can do this. Thanks!       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 Need referral to GI to follow this. Need to strictly avoid sweets and white starchy foods.

## 2021-03-05 ENCOUNTER — Other Ambulatory Visit: Payer: Self-pay

## 2021-03-05 ENCOUNTER — Telehealth: Payer: Self-pay

## 2021-03-05 MED ORDER — HYDROCHLOROTHIAZIDE 25 MG PO TABS: 25.0000 mg | ORAL_TABLET | ORAL | 0 refills | Status: DC

## 2021-03-05 NOTE — Addendum Note (Signed)
Addended by: Birdie Sons on: 03/05/2021 07:24 AM   Modules accepted: Orders

## 2021-03-05 NOTE — Telephone Encounter (Signed)
Copied from [place] [phone] [id number]. Topic: General - Call Back - No Documentation >> Mar 05, 2021  9:32 AM Loma Boston wrote: Reason for CRM:Pt was supposed to get her blood pressure down, worried about her liver and wanting to know the baseline requirement for her sodium as was wondering if the Dash Diet would be appropriate? Want a CB from Dr  F nurse as they have her work ups and wanting know if this diet would be appropriate? Advice [phone]

## 2021-03-05 NOTE — Progress Notes (Signed)
Opened in error

## 2021-03-06 DIAGNOSIS — Z6841 Body Mass Index (BMI) 40.0 and over, adult: Secondary | ICD-10-CM | POA: Diagnosis not present

## 2021-03-06 DIAGNOSIS — R600 Localized edema: Secondary | ICD-10-CM | POA: Insufficient documentation

## 2021-03-06 DIAGNOSIS — E7800 Pure hypercholesterolemia, unspecified: Secondary | ICD-10-CM | POA: Insufficient documentation

## 2021-03-06 DIAGNOSIS — I10 Essential (primary) hypertension: Secondary | ICD-10-CM | POA: Diagnosis not present

## 2021-03-06 DIAGNOSIS — R0602 Shortness of breath: Secondary | ICD-10-CM | POA: Insufficient documentation

## 2021-03-06 HISTORY — DX: Shortness of breath: R06.02

## 2021-03-09 DIAGNOSIS — R0602 Shortness of breath: Secondary | ICD-10-CM | POA: Diagnosis not present

## 2021-03-09 DIAGNOSIS — R600 Localized edema: Secondary | ICD-10-CM | POA: Diagnosis not present

## 2021-03-11 ENCOUNTER — Telehealth: Payer: Self-pay

## 2021-03-11 DIAGNOSIS — K760 Fatty (change of) liver, not elsewhere classified: Secondary | ICD-10-CM

## 2021-03-11 NOTE — Telephone Encounter (Signed)
Copied from [place] [id number]. Topic: Referral - Request for Referral >> Mar 11, 2021  3:31 PM Yvette Rack wrote: Has patient seen PCP for this complaint? yes  *If NO, is insurance requiring patient see PCP for this issue before PCP can refer them? Referral for which specialty: GI Preferred provider/office: Dr. Madolyn Frieze with [facility] ph# [phone] Reason for referral: per pt issue with liver

## 2021-03-12 NOTE — Addendum Note (Signed)
Addended by: Wilburt Finlay on: 03/12/2021 11:42 AM   Modules accepted: Orders

## 2021-03-12 NOTE — Telephone Encounter (Signed)
Order for GI referral placed. Dr. Ricky Stabs office.

## 2021-03-16 DIAGNOSIS — I10 Essential (primary) hypertension: Secondary | ICD-10-CM | POA: Diagnosis not present

## 2021-03-16 DIAGNOSIS — E7800 Pure hypercholesterolemia, unspecified: Secondary | ICD-10-CM | POA: Diagnosis not present

## 2021-03-16 DIAGNOSIS — R600 Localized edema: Secondary | ICD-10-CM | POA: Diagnosis not present

## 2021-03-16 DIAGNOSIS — R0602 Shortness of breath: Secondary | ICD-10-CM | POA: Diagnosis not present

## 2021-03-24 ENCOUNTER — Other Ambulatory Visit: Payer: Self-pay | Admitting: Gastroenterology

## 2021-03-24 DIAGNOSIS — R600 Localized edema: Secondary | ICD-10-CM | POA: Diagnosis not present

## 2021-03-24 DIAGNOSIS — I10 Essential (primary) hypertension: Secondary | ICD-10-CM | POA: Diagnosis not present

## 2021-03-24 DIAGNOSIS — R7401 Elevation of levels of liver transaminase levels: Secondary | ICD-10-CM | POA: Diagnosis not present

## 2021-03-24 DIAGNOSIS — E7800 Pure hypercholesterolemia, unspecified: Secondary | ICD-10-CM | POA: Diagnosis not present

## 2021-03-24 DIAGNOSIS — K760 Fatty (change of) liver, not elsewhere classified: Secondary | ICD-10-CM

## 2021-03-24 DIAGNOSIS — R799 Abnormal finding of blood chemistry, unspecified: Secondary | ICD-10-CM | POA: Diagnosis not present

## 2021-03-24 DIAGNOSIS — Z6841 Body Mass Index (BMI) 40.0 and over, adult: Secondary | ICD-10-CM | POA: Diagnosis not present

## 2021-03-25 ENCOUNTER — Encounter: Payer: Medicare Other | Admitting: Obstetrics and Gynecology

## 2021-03-26 DIAGNOSIS — Z1151 Encounter for screening for human papillomavirus (HPV): Secondary | ICD-10-CM | POA: Diagnosis not present

## 2021-03-26 DIAGNOSIS — Z1211 Encounter for screening for malignant neoplasm of colon: Secondary | ICD-10-CM | POA: Diagnosis not present

## 2021-03-26 DIAGNOSIS — Z124 Encounter for screening for malignant neoplasm of cervix: Secondary | ICD-10-CM | POA: Diagnosis not present

## 2021-03-26 DIAGNOSIS — Z01419 Encounter for gynecological examination (general) (routine) without abnormal findings: Secondary | ICD-10-CM | POA: Diagnosis not present

## 2021-03-27 ENCOUNTER — Telehealth: Payer: Self-pay | Admitting: *Deleted

## 2021-03-27 DIAGNOSIS — M5136 Other intervertebral disc degeneration, lumbar region: Secondary | ICD-10-CM | POA: Diagnosis not present

## 2021-03-27 DIAGNOSIS — Z9049 Acquired absence of other specified parts of digestive tract: Secondary | ICD-10-CM | POA: Diagnosis not present

## 2021-03-27 DIAGNOSIS — M4185 Other forms of scoliosis, thoracolumbar region: Secondary | ICD-10-CM | POA: Diagnosis not present

## 2021-03-27 DIAGNOSIS — M5414 Radiculopathy, thoracic region: Secondary | ICD-10-CM | POA: Diagnosis not present

## 2021-03-27 DIAGNOSIS — M5489 Other dorsalgia: Secondary | ICD-10-CM | POA: Diagnosis not present

## 2021-03-27 DIAGNOSIS — M5134 Other intervertebral disc degeneration, thoracic region: Secondary | ICD-10-CM | POA: Diagnosis not present

## 2021-03-27 NOTE — Telephone Encounter (Signed)
Yup. No issues.

## 2021-03-27 NOTE — Telephone Encounter (Signed)
Her ortho MD prescribed her a prednisone taper for 6 days for back pain and she wants to know if it is ok for her to go ahead and take it. Please advise. [phone]

## 2021-03-30 DIAGNOSIS — M9905 Segmental and somatic dysfunction of pelvic region: Secondary | ICD-10-CM | POA: Diagnosis not present

## 2021-03-30 DIAGNOSIS — M9903 Segmental and somatic dysfunction of lumbar region: Secondary | ICD-10-CM | POA: Diagnosis not present

## 2021-03-30 DIAGNOSIS — M9904 Segmental and somatic dysfunction of sacral region: Secondary | ICD-10-CM | POA: Diagnosis not present

## 2021-03-30 DIAGNOSIS — M545 Low back pain, unspecified: Secondary | ICD-10-CM | POA: Diagnosis not present

## 2021-04-03 DIAGNOSIS — M545 Low back pain, unspecified: Secondary | ICD-10-CM | POA: Diagnosis not present

## 2021-04-03 DIAGNOSIS — M9905 Segmental and somatic dysfunction of pelvic region: Secondary | ICD-10-CM | POA: Diagnosis not present

## 2021-04-03 DIAGNOSIS — M9904 Segmental and somatic dysfunction of sacral region: Secondary | ICD-10-CM | POA: Diagnosis not present

## 2021-04-03 DIAGNOSIS — M9903 Segmental and somatic dysfunction of lumbar region: Secondary | ICD-10-CM | POA: Diagnosis not present

## 2021-04-06 DIAGNOSIS — M9904 Segmental and somatic dysfunction of sacral region: Secondary | ICD-10-CM | POA: Diagnosis not present

## 2021-04-06 DIAGNOSIS — M9905 Segmental and somatic dysfunction of pelvic region: Secondary | ICD-10-CM | POA: Diagnosis not present

## 2021-04-06 DIAGNOSIS — M9903 Segmental and somatic dysfunction of lumbar region: Secondary | ICD-10-CM | POA: Diagnosis not present

## 2021-04-06 DIAGNOSIS — M545 Low back pain, unspecified: Secondary | ICD-10-CM | POA: Diagnosis not present

## 2021-04-10 DIAGNOSIS — M9904 Segmental and somatic dysfunction of sacral region: Secondary | ICD-10-CM | POA: Diagnosis not present

## 2021-04-10 DIAGNOSIS — M545 Low back pain, unspecified: Secondary | ICD-10-CM | POA: Diagnosis not present

## 2021-04-10 DIAGNOSIS — M9905 Segmental and somatic dysfunction of pelvic region: Secondary | ICD-10-CM | POA: Diagnosis not present

## 2021-04-10 DIAGNOSIS — M9903 Segmental and somatic dysfunction of lumbar region: Secondary | ICD-10-CM | POA: Diagnosis not present

## 2021-04-19 ENCOUNTER — Other Ambulatory Visit: Payer: Self-pay

## 2021-04-19 NOTE — Telephone Encounter (Signed)
Requested medication (s) are due for refill today: yes  Requested medication (s) are on the active medication list: yes  Last refill:  10/02/20  Future visit scheduled: yes  Notes to clinic:  med not assigned to a protocol   Requested Prescriptions  Pending Prescriptions Disp Refills   magic mouthwash SOLN 60 mL 3    Sig: 1 part 2% viscous lidocaine: 1 part Maalox: 1 part diphenhydramine 12.5 mg per 5 ml elixir. Rinse and spit 5 ml every six hour as needed      Off-Protocol Failed - 04/19/2021  1:32 PM      Failed - Medication not assigned to a protocol, review manually.      Passed - Valid encounter within last 12 months    Recent Outpatient Visits           1 month ago [place], Kirstie Peri, MD   3 months ago [place], Jennifer M, [place]   4 months ago Sinusitis, unspecified chronicity, unspecified location   [facility] [place], Kelby Aline, FNP   6 months ago Urinary tract infection without hematuria, site unspecified   [place], PA-C   7 months ago Urinary tract infection without hematuria, site unspecified   [place], Kelby Aline, FNP       Future Appointments             In 1 month [place], Kirstie Peri, MD [facility], [place]   In 2 months [place], Kathlene November, [facility]            Gastroenterology:  [place] Passed - 04/19/2021  1:32 PM      Passed - Valid encounter within last 12 months    Recent Outpatient Visits           1 month ago [place], Kirstie Peri, MD   3 months ago [place], Jennifer M, [place]   4 months ago Sinusitis, unspecified chronicity, unspecified location   [facility] [place], Kelby Aline, FNP   6 months ago Urinary tract infection without hematuria,  site unspecified   [place], PA-C   7 months ago Urinary tract infection without hematuria, site unspecified   [facility], Kelby Aline, FNP       Future Appointments             In 1 month [place], Kirstie Peri, MD [facility], [place]   In 2 months [place], Kathlene November, [facility]            Over the Counter:  OTC Passed - 04/19/2021  1:32 PM      Passed - Valid encounter within last 12 months    Recent Outpatient Visits           1 month ago [place], Kirstie Peri, MD   3 months ago [place], [place]   4 months ago Sinusitis, unspecified chronicity, unspecified location   [facility] [place], Kelby Aline, FNP   6 months ago Urinary tract infection without hematuria, site unspecified   [place], [place]   7 months ago Urinary tract infection without hematuria, site unspecified   [facility] [place], Kelby Aline, FNP       Future Appointments             In 1 month [place], Kirstie Peri, MD [facility]   In 2 months [place], Kathlene November, MD [facility]

## 2021-04-20 ENCOUNTER — Other Ambulatory Visit: Payer: Medicare Other

## 2021-04-20 ENCOUNTER — Ambulatory Visit
Admission: EM | Admit: 2021-04-20 | Discharge: 2021-04-20 | Disposition: A | Discharge status: Home / Self Care | Payer: Medicare Other | Attending: Emergency Medicine | Admitting: Emergency Medicine

## 2021-04-20 ENCOUNTER — Other Ambulatory Visit: Payer: Self-pay

## 2021-04-20 DIAGNOSIS — Z20822 Contact with and (suspected) exposure to covid-19: Secondary | ICD-10-CM | POA: Diagnosis not present

## 2021-04-20 DIAGNOSIS — J069 Acute upper respiratory infection, unspecified: Secondary | ICD-10-CM

## 2021-04-20 MED ORDER — IPRATROPIUM BROMIDE 0.06 % NA SOLN: 2.0000 | Freq: Four times a day (QID) | NASAL | 12 refills | Status: DC

## 2021-04-20 MED ORDER — ALBUTEROL SULFATE HFA 108 (90 BASE) MCG/ACT IN AERS: 2.0000 | INHALATION_SPRAY | Freq: Four times a day (QID) | RESPIRATORY_TRACT | 2 refills | Status: DC | PRN

## 2021-04-20 MED ORDER — PROMETHAZINE-DM 6.25-15 MG/5ML PO SYRP: 5.0000 mL | ORAL_SOLUTION | Freq: Four times a day (QID) | ORAL | 0 refills | Status: DC | PRN

## 2021-04-20 MED ORDER — BENZONATATE 100 MG PO CAPS
200.0000 mg | ORAL_CAPSULE | Freq: Three times a day (TID) | ORAL | 0 refills | Status: DC
Start: 2021-04-20 — End: 2021-09-05

## 2021-04-20 NOTE — ED Provider Notes (Signed)
[facility]    CSN: [id number] Arrival date & time: 04/20/21  1034      History   Chief Complaint Chief Complaint  Patient presents with  . Cough  . Nasal Congestion    HPI Kaylee Reyes is a 70 y.o. female.   HPI   70 year old female here for evaluation of cold symptoms.  Patient reports that she has been experiencing a mild headache, runny nose with postnasal drip, and a intermittently productive cough for the last 4 days.  She denies fever, shortness breath or wheezing, GI complaints, or ear pain.  Patient had COVID back in February and was successfully treated with a monoclonal antibody infusion.  Past Medical History:  Diagnosis Date  . Anemia   . Body tinea 10/01/2015   Previously treated by Dr. Koleen Nimrod   . GERD (gastroesophageal reflux disease)   . History of breast cancer 09/2015   breast cancer  . History of chicken pox   . History of peptic ulcer disease 09/25/2015  . Hyperlipidemia   . Hypertension   . Hypothyroidism   . Neuropathy of foot    bilateral  . Pinched nerve   . PONV (postoperative nausea and vomiting)    exploratory surgery at [facility]  . Thyroid disease   . Tuberculosis exposure    Mother had TB.  Pt shows (+) on tests, but has never had.  . Vertigo    last episode over 1 yr ago  . Wears dentures    partial upper and lower    Patient Active Problem List   Diagnosis Date Noted  . COVID-19 02/23/2021  . Morbid obesity ([place]) 08/06/2019  . Menopause 03/23/2017  . Rectocele 03/23/2017  . Cystocele, midline 03/23/2017  . Vaginal atrophy 03/23/2017  . Left breast cancer with T3 tumor, >5 cm in greatest dimension ([place])   . S/P bilateral mastectomy 10/28/2015  . Fatty infiltration of liver 10/01/2015  . Gonalgia 10/01/2015  . Invasive lobular carcinoma of right breast, stage 2 ([place]) 10/01/2015  . Hypothyroidism 09/25/2015  . Adult BMI 35.0-35.9 kg/sq m 09/25/2015  . Hypercholesterolemia 09/25/2015  . Hypertension  09/25/2015  . Allergic rhinitis 09/25/2015  . GERD (gastroesophageal reflux disease) 09/25/2015  . Alopecia 09/25/2015  . Eczema 09/25/2015    Past Surgical History:  Procedure Laterality Date  . ABDOMINAL HYSTERECTOMY  07/13/2013   Total. with BSO for postmenopausal bleeding and fibroids with large cervical polyp. Dr. Levy Sjogren and Dr. Glennon Mac [facility]  . ABDOMINAL ULTRASOUND  04/17/2011   Hepatomegaly with borderline slenomegaly. Suggestive of fatty liver. s/p cholecystectomy. Portions of aorta obscured  . BREAST SURGERY Right 2011   BREAST BIOPSY  . CESAREAN SECTION  1988  . CHOLECYSTECTOMY  1990  . COLONOSCOPY WITH PROPOFOL N/A 09/01/2017   Procedure: COLONOSCOPY WITH PROPOFOL;  Surgeon: [name], MD;  Location: [place];  Service: Gastroenterology;  Laterality: N/A;  . DIAGNOSTIC LAPAROSCOPY    . ESOPHAGOGASTRODUODENOSCOPY (EGD) WITH PROPOFOL N/A 01/17/2020   Procedure: ESOPHAGOGASTRODUODENOSCOPY (EGD) WITH PROPOFOL;  Surgeon: [name], MD;  Location: [facility];  Service: Endoscopy;  Laterality: N/A;  . EXPLORATORY LAPAROTOMY     Left breast  . MASTECTOMY    . MASTECTOMY W/ SENTINEL NODE BIOPSY Bilateral 10/28/2015   Procedure: MASTECTOMY WITH SENTINEL LYMPH NODE BIOPSY;  Surgeon: Hubbard Robinson, MD;  Location: [facility];  Service: General;  Laterality: Bilateral;  . PORT-A-CATH REMOVAL Left 10/11/2016   Procedure: REMOVAL PORT-A-CATH;  Surgeon: Hubbard Robinson, MD;  Location: [facility];  Service: General;  Laterality: Left;  . PORTACATH PLACEMENT Left 12/23/2015   Procedure: INSERTION PORT-A-CATH;  Surgeon: Hubbard Robinson, MD;  Location: [facility];  Service: General;  Laterality: Left;  . WRIST SURGERY      OB History    Gravida  3   Para  2   Term  2   Preterm      AB  1   Living  2     SAB  1   IAB      Ectopic      Multiple      Live Births  2            Home Medications    Prior to Admission medications   Medication  Sig Start Date End Date Taking? Authorizing Provider  atenolol (TENORMIN) 50 MG tablet TAKE 1 TABLET DAILY 02/26/21  Yes Birdie Sons, MD  B Complex-C (SUPER B COMPLEX PO) Take 1 tablet by mouth daily.   Yes [provider]  benzonatate (TESSALON) 100 MG capsule Take 2 capsules (200 mg total) by mouth every 8 (eight) hours. 04/20/21  Yes Margarette Canada, NP  CALCIUM CITRATE PO Take 1 tablet by mouth daily.   Yes [provider]  Carboxymeth-Glycerin-Polysorb (REFRESH OPTIVE MEGA-3 OP) Place 1 drop into both eyes daily.   Yes [provider]  docusate sodium (COLACE) 100 MG capsule Take 200 mg by mouth daily.    Yes [provider]  ELDERBERRY PO Take by mouth daily.    Yes [provider]  fexofenadine (ALLEGRA) 180 MG tablet Take 180 mg by mouth daily.   Yes [provider]  hydrochlorothiazide (HYDRODIURIL) 25 MG tablet Take 1 tablet (25 mg total) by mouth every other day. Alternate with triamterene-hctz 03/05/21  Yes Birdie Sons, MD  ibuprofen (ADVIL,MOTRIN) 200 MG tablet Take 400 mg by mouth every 6 (six) hours as needed for mild pain.    Yes [provider]  ipratropium (ATROVENT) 0.06 % nasal spray Place 2 sprays into both nostrils 4 (four) times daily. 04/20/21  Yes Margarette Canada, NP  ketoconazole (NIZORAL) 2 % cream Apply 1 application topically daily. 09/22/20  Yes Mar Daring, PA-C  lansoprazole (PREVACID) 30 MG capsule TAKE 1 CAPSULE DAILY AT 12 NOON (NEED OFFICE VISIT PRIOR TO FURTHER REFILLS) 01/26/21  Yes Birdie Sons, MD  letrozole [facility]) 2.5 MG tablet TAKE 1 TABLET BY MOUTH EVERY DAY 09/22/20 07/04/21 Yes Birdie Sons, MD  levothyroxine (SYNTHROID) 75 MCG tablet TAKE 1 TABLET DAILY 02/09/21  Yes Birdie Sons, MD  magic mouthwash SOLN 1 part 2% viscous lidocaine: 1 part Maalox: 1 part diphenhydramine 12.5 mg per 5 ml elixir. Rinse and spit 5 ml every six hour as needed 09/22/20  Yes Mar Daring, PA-C  Magnesium 400 MG TABS Take 600 mg by mouth daily.    Yes [provider]  meclizine (ANTIVERT) 12.5 MG tablet Take 1 tablet (12.5 mg total) by mouth 3 (three) times daily as needed for dizziness. 01/02/21  Yes Birdie Sons, MD  Misc Natural Products (TURMERIC CURCUMIN) CAPS Take 1 capsule by mouth daily.   Yes [provider]  nystatin (MYCOSTATIN/NYSTOP) powder Apply 1 application topically 3 (three) times daily. 09/22/20  Yes Mar Daring, PA-C  promethazine-dextromethorphan (PROMETHAZINE-DM) 6.25-15 MG/5ML syrup Take 5 mLs by mouth 4 (four) times daily as needed. 04/20/21  Yes Margarette Canada, NP  sodium chloride (OCEAN) 0.65 %  SOLN nasal spray Place 1 spray into both nostrils as needed for congestion. 12/04/20  Yes Flinchum, Eula Fried, FNP  triamcinolone cream (KENALOG) 0.5 % Apply 1 application topically 2 (two) times daily as needed. For itching 09/17/19  Yes Malva Limes, MD  triamterene-hydrochlorothiazide (MAXZIDE-25) 37.5-25 MG tablet Take 1 tablet by mouth daily. TAKE IN PLACE OF HYDROCHLOROTHIAZIDE 02/23/21  Yes Malva Limes, MD  Vitamin D, Ergocalciferol, (DRISDOL) 1.25 MG (50000 UNIT) CAPS capsule TAKE 1 CAPSULE BY MOUTH 1 TIME A WEEK 12/23/20  Yes Jeralyn Ruths, MD  albuterol (VENTOLIN HFA) 108 (90 Base) MCG/ACT inhaler Inhale 2 puffs into the lungs every 6 (six) hours as needed for wheezing or shortness of breath. 04/20/21   Becky Augusta, NP  fluticasone [facility]) 50 MCG/ACT nasal spray Place 2 sprays into both nostrils daily. 01/21/21 01/21/21  Rodriguez-Southworth, Nettie Elm, PA-C    Family History Family History  Problem Relation Age of Onset  . Hypertension Mother   . Thyroid disease Mother   . Hypertension Brother   . Diabetes Brother   . Breast cancer Neg Hx   . Ovarian cancer Neg Hx   . Colon cancer Neg Hx     Social History Social History   Tobacco Use  . Smoking status: Never Smoker  . Smokeless tobacco: Never Used   Vaping Use  . Vaping Use: Never used  Substance Use Topics  . Alcohol use: No    Alcohol/week: 0.0 standard drinks  . Drug use: No     Allergies   Penicillins, Nickel, Keflex [cephalexin], and Pravastatin sodium   Review of Systems Review of Systems  Constitutional: Negative for activity change, appetite change and fever.  HENT: Positive for congestion, postnasal drip and rhinorrhea. Negative for sore throat.   Respiratory: Positive for cough. Negative for shortness of breath and wheezing.   Gastrointestinal: Negative for diarrhea, nausea and vomiting.  Skin: Negative for rash.  Neurological: Positive for headaches.  Hematological: Negative.   Psychiatric/Behavioral: Negative.      Physical Exam Triage Vital Signs ED Triage Vitals  Enc Vitals Group     BP 04/20/21 1057 (!) 149/77     Pulse Rate 04/20/21 1057 69     Resp 04/20/21 1057 18     Temp 04/20/21 1057 98.7 F (37.1 C)     Temp Source 04/20/21 1057 Oral     SpO2 04/20/21 1057 98 %     Weight 04/20/21 1053 275 lb (124.7 kg)     Height 04/20/21 1053 5\' 6"  (1.676 m)     Head Circumference --      Peak Flow --      Pain Score 04/20/21 1052 0     Pain Loc --      Pain Edu? --      Excl. in GC? --    No data found.  Updated Vital Signs BP (!) 149/77 (BP Location: Left Wrist)   Pulse 69   Temp 98.7 F (37.1 C) (Oral)   Resp 18   Ht 5\' 6"  (1.676 m)   Wt 275 lb (124.7 kg)   SpO2 98%   BMI 44.39 kg/m   Visual Acuity Right Eye Distance:   Left Eye Distance:   Bilateral Distance:    Right Eye Near:   Left Eye Near:    Bilateral Near:     Physical Exam Vitals and nursing note reviewed.  Constitutional:      General: She is not in acute distress.  Appearance: Normal appearance. She is obese. She is not ill-appearing.  HENT:     Head: Normocephalic and atraumatic.     Right Ear: Tympanic membrane, ear canal and external ear normal. There is no impacted cerumen.     Left Ear: Tympanic membrane,  ear canal and external ear normal. There is no impacted cerumen.     Nose: Congestion and rhinorrhea present.     Mouth/Throat:     Mouth: Mucous membranes are moist.     Pharynx: Oropharynx is clear. No posterior oropharyngeal erythema.  Cardiovascular:     Rate and Rhythm: Normal rate and regular rhythm.     Pulses: Normal pulses.     Heart sounds: Normal heart sounds. No murmur heard. No gallop.   Pulmonary:     Effort: Pulmonary effort is normal.     Breath sounds: Wheezing present. No rhonchi or rales.  Musculoskeletal:     Cervical back: Normal range of motion and neck supple.  Lymphadenopathy:     Cervical: No cervical adenopathy.  Skin:    General: Skin is warm and dry.     Capillary Refill: Capillary refill takes less than 2 seconds.     Findings: No erythema or rash.  Neurological:     General: No focal deficit present.     Mental Status: She is alert and oriented to person, place, and time.  Psychiatric:        Mood and Affect: Mood normal.        Behavior: Behavior normal.        Thought Content: Thought content normal.        Judgment: Judgment normal.      UC Treatments / Results  Labs (all labs ordered are listed, but only abnormal results are displayed) Labs Reviewed  SARS CORONAVIRUS 2 (TAT 6-24 HRS)    EKG   Radiology No results found.  Procedures Procedures (including critical care time)  Medications Ordered in UC Medications - No data to display  Initial Impression / Assessment and Plan / UC Course  I have reviewed the triage vital signs and the nursing notes.  Pertinent labs & imaging results that were available during my care of the patient were reviewed by me and considered in my medical decision making (see chart for details).   Patient is a very pleasant, nontoxic-appearing 70 year old female here for evaluation of headache with runny nose and postnasal drip and an intermittently productive cough for the last 4 days.  She has had no  other associated symptoms.  Physical exam reveals pearly gray tympanic membranes bilaterally with a normal light reflex and clear external auditory canals.  Nasal mucosa is edematous and pale with clear postnasal drip.  No cervical lymphadenopathy appreciated exam.  Cardiopulmonary exam has scattered wheezes throughout but no rales or rhonchi.  Patient is requesting to be tested for COVID though she had COVID in February and was treated with a monoclonal antibody infusion at that time.  Will swab patient for COVID and have her isolate at home pending the results.  We will treat her symptoms with Atrovent nasal spray, Tessalon Perles, and Promethazine DM cough syrup.   Final Clinical Impressions(s) / UC Diagnoses   Final diagnoses:  Viral URI with cough     Discharge Instructions     Isolate at home pending the results of your COVID test.  If you test positive then you will have to quarantine for 5 days from the start of your symptoms.  After  5 days you can break quarantine if your symptoms have improved and you have not had a fever for 24 hours without taking Tylenol or ibuprofen.  Use over-the-counter Tylenol and ibuprofen as needed for body aches and fever.  Use the ipratropium nasal spray, 2 squirts in each nostril 4 times a day, as needed for nasal congestion and runny nose.  Use the Tessalon Perles during the day as needed for cough and the Promethazine DM cough syrup at nighttime as will make you drowsy.  If you develop any increased shortness of breath-especially at rest, you are unable to speak in full sentences, or is a late sign your lips are turning blue you need to go the ER for evaluation.     ED Prescriptions    Medication Sig Dispense Auth. Provider   albuterol (VENTOLIN HFA) 108 (90 Base) MCG/ACT inhaler Inhale 2 puffs into the lungs every 6 (six) hours as needed for wheezing or shortness of breath. 18 g Margarette Canada, NP   benzonatate (TESSALON) 100 MG capsule Take 2  capsules (200 mg total) by mouth every 8 (eight) hours. 21 capsule Margarette Canada, NP   ipratropium (ATROVENT) 0.06 % nasal spray Place 2 sprays into both nostrils 4 (four) times daily. 15 mL Margarette Canada, NP   promethazine-dextromethorphan (PROMETHAZINE-DM) 6.25-15 MG/5ML syrup Take 5 mLs by mouth 4 (four) times daily as needed. 118 mL Margarette Canada, NP     PDMP not reviewed this encounter.   Margarette Canada, NP 04/20/21 1116

## 2021-04-20 NOTE — Discharge Instructions (Addendum)
Isolate at home pending the results of your COVID test.  If you test positive then you will have to quarantine for 5 days from the start of your symptoms.  After 5 days you can break quarantine if your symptoms have improved and you have not had a fever for 24 hours without taking Tylenol or ibuprofen.  Use over-the-counter Tylenol and ibuprofen as needed for body aches and fever.  Use the ipratropium nasal spray, 2 squirts in each nostril 4 times a day, as needed for nasal congestion and runny nose.  Use the Tessalon Perles during the day as needed for cough and the Promethazine DM cough syrup at nighttime as will make you drowsy.  If you develop any increased shortness of breath-especially at rest, you are unable to speak in full sentences, or is a late sign your lips are turning blue you need to go the ER for evaluation.  

## 2021-04-20 NOTE — ED Triage Notes (Signed)
Pt c/o runny nose, cough, headache since Thursday. Pt also reports chest discomfort when coughing. Pt also needs refill on albuterol HFA. Pt denies f/n/v/d or other symptoms. Pt would like a COVID test, pt reports having COVID 2/21.

## 2021-04-21 LAB — SARS CORONAVIRUS 2 (TAT 6-24 HRS): SARS Coronavirus 2: NEGATIVE

## 2021-05-09 NOTE — Telephone Encounter (Signed)
Rx has been in box for 2 weeks. Resending to Dr. Caryn Section for review.

## 2021-05-11 ENCOUNTER — Other Ambulatory Visit: Payer: Medicare Other

## 2021-05-12 MED ORDER — MAGIC MOUTHWASH: ORAL | 3 refills | Status: DC

## 2021-05-12 NOTE — Telephone Encounter (Signed)
RX called into [facility].   Thanks,   -Mickel Baas

## 2021-05-12 NOTE — Telephone Encounter (Signed)
Please call in. E-prescription not going through.

## 2021-05-18 ENCOUNTER — Other Ambulatory Visit: Payer: Self-pay

## 2021-05-18 ENCOUNTER — Ambulatory Visit
Admission: RE | Admit: 2021-05-18 | Discharge: 2021-05-18 | Disposition: A | Discharge status: Home / Self Care | Payer: Medicare Other | Source: Ambulatory Visit | Attending: Gastroenterology | Admitting: Gastroenterology

## 2021-05-18 DIAGNOSIS — K7689 Other specified diseases of liver: Secondary | ICD-10-CM | POA: Diagnosis not present

## 2021-05-18 DIAGNOSIS — K760 Fatty (change of) liver, not elsewhere classified: Secondary | ICD-10-CM | POA: Diagnosis not present

## 2021-05-18 DIAGNOSIS — R7401 Elevation of levels of liver transaminase levels: Secondary | ICD-10-CM | POA: Diagnosis not present

## 2021-06-05 ENCOUNTER — Other Ambulatory Visit: Payer: Self-pay | Admitting: Family Medicine

## 2021-06-06 ENCOUNTER — Other Ambulatory Visit: Payer: Self-pay | Admitting: Oncology

## 2021-06-10 ENCOUNTER — Other Ambulatory Visit: Payer: Self-pay | Admitting: Family Medicine

## 2021-06-10 NOTE — Telephone Encounter (Signed)
Requested medication (s) are due for refill today:   No  Requested medication (s) are on the active medication list:   Yes  Future visit scheduled:   No   Last ordered: 06/05/2021 #45, 0 refills.   To be alternated with triamterene/HCTZ.      Returned because pharmacy saying a new rx is needed.   Requested Prescriptions  Pending Prescriptions Disp Refills   hydrochlorothiazide (HYDRODIURIL) 25 MG tablet [Pharmacy Med Name: HYDROCHLOROTHIAZIDE 25MG  TABLETS] 45 tablet 0    Sig: TAKE 1 TABLET BY MOUTH EVERY OTHER DAY. ALTERNATE WITH TRIAMTERENE/HCTZ      Cardiovascular: Diuretics - Thiazide Failed - 06/10/2021  8:09 AM      Failed - Last BP in normal range    BP Readings from Last 1 Encounters:  04/20/21 (!) 149/77          Passed - Ca in normal range and within 360 days    Calcium  Date Value Ref Range Status  02/27/2021 9.5 8.9 - 10.3 mg/dL Final   Calcium, Total  Date Value Ref Range Status  07/15/2013 8.2 (L) 8.5 - 10.1 mg/dL Final          Passed - Cr in normal range and within 360 days    Creatinine  Date Value Ref Range Status  07/15/2013 0.98 0.60 - 1.30 mg/dL Final   Creatinine, Ser  Date Value Ref Range Status  02/27/2021 0.81 0.44 - 1.00 mg/dL Final          Passed - K in normal range and within 360 days    Potassium  Date Value Ref Range Status  02/27/2021 3.6 3.5 - 5.1 mmol/L Final  07/15/2013 4.1 3.5 - 5.1 mmol/L Final          Passed - Na in normal range and within 360 days    Sodium  Date Value Ref Range Status  02/27/2021 137 135 - 145 mmol/L Final  02/23/2021 140 134 - 144 mmol/L Final  07/15/2013 136 136 - 145 mmol/L Final          Passed - Valid encounter within last 6 months    Recent Outpatient Visits           3 months ago [place], Donald E, MD   4 months ago [place], [place], PA-C   6 months ago Sinusitis, unspecified chronicity, unspecified location    [facility] Fl[facility], Kelby Aline, FNP   8 months ago Urinary tract infection without hematuria, site unspecified   [facility], Eugenio Saenz, [place]   9 months ago Urinary tract infection without hematuria, site unspecified   [facility], Kelby Aline, FNP       Future Appointments             In 1 month [place], Kathlene November, MD [facility]

## 2021-06-12 ENCOUNTER — Ambulatory Visit: Payer: Self-pay | Admitting: Family Medicine

## 2021-06-15 ENCOUNTER — Other Ambulatory Visit: Payer: Self-pay

## 2021-06-15 ENCOUNTER — Ambulatory Visit (INDEPENDENT_AMBULATORY_CARE_PROVIDER_SITE_OTHER): Payer: Medicare Other | Admitting: Family Medicine

## 2021-06-15 ENCOUNTER — Encounter: Payer: Self-pay | Admitting: Family Medicine

## 2021-06-15 VITALS — BP 164/75 | HR 78 | Wt 274.0 lb

## 2021-06-15 DIAGNOSIS — I10 Essential (primary) hypertension: Secondary | ICD-10-CM

## 2021-06-15 DIAGNOSIS — L2082 Flexural eczema: Secondary | ICD-10-CM

## 2021-06-15 MED ORDER — ATENOLOL 25 MG PO TABS: 25.0000 mg | ORAL_TABLET | Freq: Every evening | ORAL | 1 refills | Status: DC

## 2021-06-15 MED ORDER — TRIAMCINOLONE ACETONIDE 0.5 % EX CREA: 1.0000 "application " | TOPICAL_CREAM | Freq: Two times a day (BID) | CUTANEOUS | 0 refills | Status: DC | PRN

## 2021-06-15 MED ORDER — HYDROCHLOROTHIAZIDE 25 MG PO TABS: ORAL_TABLET | ORAL | 3 refills | Status: DC

## 2021-06-15 NOTE — Progress Notes (Signed)
Established patient visit   Patient: Kaylee Reyes   DOB: 27-Feb-1951   70 y.o. Female  MRN: [id number] Visit Date: 06/15/2021  Today's healthcare provider: Lelon Huh, MD   No chief complaint on file.  Subjective    HPI  Hypertension, follow-up  BP Readings from Last 3 Encounters:  06/15/21 (!) 164/75  04/20/21 (!) 149/77  02/27/21 (!) 159/78   Wt Readings from Last 3 Encounters:  06/15/21 274 lb (124.3 kg)  04/20/21 275 lb (124.7 kg)  02/27/21 298 lb (135.2 kg)     She was last seen for hypertension 3 months ago.  BP at that visit was 156/83. Management since that visit includes changing for HCTZ to triamterene/hctz 37.5/25mg  daily.  She reports poor compliance with treatment. She is having side effects.  Pt states her diastolic number has been dropping in the 50's after change to triamterene-hctz.  She is following a Low Sodium diet. She is not exercising. She does not smoke.  Use of agents associated with hypertension: none.   Outside blood pressures are 140's/ 60's since stopping triamterene/HCTZ Symptoms: No chest pain No chest pressure  No palpitations No syncope  No dyspnea No orthopnea  No paroxysmal nocturnal dyspnea No lower extremity edema   Pertinent labs: Lab Results  Component Value Date   CHOL 247 (H) 07/15/2020   HDL 39 (L) 07/15/2020   LDLCALC 179 (H) 07/15/2020   TRIG 154 (H) 07/15/2020   CHOLHDL 6.2 (H) 11/20/2019   Lab Results  Component Value Date   NA 137 02/27/2021   K 3.6 02/27/2021   CREATININE 0.81 02/27/2021   GFRNONAA >60 02/27/2021   GFRAA 85 09/12/2020   GLUCOSE 130 (H) 02/27/2021     The 10-year ASCVD risk score Mikey Bussing DC Jr., et al., 2013) is: 20.8%   ---------------------------------------------------------------------------------------------------      Medications: Outpatient Medications Prior to Visit  Medication Sig   albuterol (VENTOLIN HFA) 108 (90 Base) MCG/ACT inhaler Inhale 2 puffs into the  lungs every 6 (six) hours as needed for wheezing or shortness of breath.   atenolol (TENORMIN) 50 MG tablet TAKE 1 TABLET DAILY   B Complex-C (SUPER B COMPLEX PO) Take 1 tablet by mouth daily.   benzonatate (TESSALON) 100 MG capsule Take 2 capsules (200 mg total) by mouth every 8 (eight) hours.   CALCIUM CITRATE PO Take 1 tablet by mouth daily.   Carboxymeth-Glycerin-Polysorb (REFRESH OPTIVE MEGA-3 OP) Place 1 drop into both eyes daily.   docusate sodium (COLACE) 100 MG capsule Take 200 mg by mouth daily.    ELDERBERRY PO Take by mouth daily.    fexofenadine (ALLEGRA) 180 MG tablet Take 180 mg by mouth daily.   hydrochlorothiazide (HYDRODIURIL) 25 MG tablet TAKE 1 TABLET BY MOUTH EVERY OTHER DAY. ALTERNATE WITH TRIAMTERENE/HCTZ   ipratropium (ATROVENT) 0.06 % nasal spray Place 2 sprays into both nostrils 4 (four) times daily.   ketoconazole (NIZORAL) 2 % cream Apply 1 application topically daily.   lansoprazole (PREVACID) 30 MG capsule TAKE 1 CAPSULE DAILY AT 12 NOON (NEED OFFICE VISIT PRIOR TO FURTHER REFILLS)   letrozole (FEMARA) 2.5 MG tablet TAKE 1 TABLET BY MOUTH EVERY DAY   levothyroxine (SYNTHROID) 75 MCG tablet TAKE 1 TABLET DAILY   magic mouthwash SOLN 1 part 2% viscous lidocaine: 1 part Maalox: 1 part diphenhydramine 12.5 mg per 5 ml elixir. Rinse and spit 5 ml every six hour as needed   Magnesium 400 MG TABS Take 600 mg  by mouth daily.    meclizine (ANTIVERT) 12.5 MG tablet Take 1 tablet (12.5 mg total) by mouth 3 (three) times daily as needed for dizziness.   Misc Natural Products (TURMERIC CURCUMIN) CAPS Take 1 capsule by mouth daily.   nystatin (MYCOSTATIN/NYSTOP) powder Apply 1 application topically 3 (three) times daily.   promethazine-dextromethorphan (PROMETHAZINE-DM) 6.25-15 MG/5ML syrup Take 5 mLs by mouth 4 (four) times daily as needed.   sodium chloride (OCEAN) 0.65 % SOLN nasal spray Place 1 spray into both nostrils as needed for congestion.   triamcinolone cream  (KENALOG) 0.5 % Apply 1 application topically 2 (two) times daily as needed. For itching   Vitamin D, Ergocalciferol, (DRISDOL) 1.25 MG (50000 UNIT) CAPS capsule TAKE 1 CAPSULE BY MOUTH 1 TIME A WEEK   ibuprofen (ADVIL,MOTRIN) 200 MG tablet Take 400 mg by mouth every 6 (six) hours as needed for mild pain.    triamterene-hydrochlorothiazide (MAXZIDE-25) 37.5-25 MG tablet Take 1 tablet by mouth daily. TAKE IN PLACE OF HYDROCHLOROTHIAZIDE (Patient not taking: Reported on 06/15/2021)   No facility-administered medications prior to visit.    Review of Systems  Constitutional: Negative.   Respiratory: Negative.    Cardiovascular: Negative.   Gastrointestinal: Negative.   Neurological:  Negative for dizziness, syncope, speech difficulty, light-headedness, numbness and headaches.      Objective    BP (!) 164/75 (BP Location: Left Wrist, Patient Position: Sitting, Cuff Size: Normal)   Pulse 78   Wt 274 lb (124.3 kg)   SpO2 98%   BMI 44.22 kg/m    Physical Exam  General appearance: Severely obese female, cooperative and in no acute distress Head: Normocephalic, without obvious abnormality, atraumatic Respiratory: Respirations even and unlabored, normal respiratory rate Extremities: All extremities are intact.  Skin: Skin color, texture, turgor normal. No rashes seen  Psych: Appropriate mood and affect. Neurologic: Mental status: Alert, oriented to person, place, and time, thought content appropriate.     Assessment & Plan     1. Primary hypertension She didn't tolerate triamterene due to very low diastolics, and didn't notice any improvement in systolics while taking. Now back on hctz 25. She would like to try addition 25mg  of atenolol in the evening in addition to morning  dose of 50mg      2. 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 For itching  Dispense: 30 g; Refill: 0        The entirety of the information documented in the History of Present Illness, Review of Systems and Physical Exam were personally obtained by me. Portions of this information were initially documented by the CMA and reviewed by me for thoroughness and accuracy.     Lelon Huh, MD  [facility] [phone] (phone) [phone] (fax)  [place]

## 2021-07-07 DIAGNOSIS — H16223 Keratoconjunctivitis sicca, not specified as Sjogren's, bilateral: Secondary | ICD-10-CM | POA: Diagnosis not present

## 2021-07-07 DIAGNOSIS — H2513 Age-related nuclear cataract, bilateral: Secondary | ICD-10-CM | POA: Diagnosis not present

## 2021-07-10 NOTE — Progress Notes (Signed)
  [facility]  [phone] [phone]) [phone]  ID: Kaylee Reyes OB: 1951/05/30  MR#: [id number]  [id number]  Patient Care Team: Birdie Sons, MD as PCP - General (Family Medicine) [name], MD as Consulting Physician (Gastroenterology) Diamond Nickel, DO as Consulting Physician (Sports Medicine) Sharlet Salina, MD as Referring Physician (Physical Medicine and Rehabilitation) Olean Ree, MD as Consulting Physician (General Surgery) Lloyd Huger, MD as Consulting Physician (Oncology) Anell Barr, OD (Optometry) Rubie Maid, MD as Referring Physician (Obstetrics and Gynecology)        Lloyd Huger, MD   07/10/2021 11:30 AM          This encounter was created in error - please disregard.

## 2021-07-14 DIAGNOSIS — M47816 Spondylosis without myelopathy or radiculopathy, lumbar region: Secondary | ICD-10-CM | POA: Diagnosis not present

## 2021-07-14 DIAGNOSIS — M533 Sacrococcygeal disorders, not elsewhere classified: Secondary | ICD-10-CM | POA: Diagnosis not present

## 2021-07-14 DIAGNOSIS — G8929 Other chronic pain: Secondary | ICD-10-CM | POA: Diagnosis not present

## 2021-07-14 DIAGNOSIS — M1612 Unilateral primary osteoarthritis, left hip: Secondary | ICD-10-CM | POA: Diagnosis not present

## 2021-07-14 DIAGNOSIS — M25552 Pain in left hip: Secondary | ICD-10-CM | POA: Diagnosis not present

## 2021-07-16 ENCOUNTER — Inpatient Hospital Stay: Payer: Medicare Other | Admitting: Oncology

## 2021-07-16 ENCOUNTER — Encounter: Payer: Self-pay | Admitting: Oncology

## 2021-07-16 DIAGNOSIS — C50911 Malignant neoplasm of unspecified site of right female breast: Secondary | ICD-10-CM

## 2021-07-16 NOTE — Progress Notes (Signed)
Patient states no concerns at the moment. 

## 2021-07-27 ENCOUNTER — Other Ambulatory Visit: Payer: Self-pay | Admitting: Family Medicine

## 2021-07-30 NOTE — Progress Notes (Signed)
[facility]  [phone] [phone]  ID: Kaylee Reyes OB: May 14, 1951  MR#: [id number]  [id number]  Patient Care Team: Birdie Sons, MD as PCP - General (Family Medicine) [name], MD as Consulting Physician (Gastroenterology) Diamond Nickel, DO as Consulting Physician (Sports Medicine) Sharlet Salina, MD as Referring Physician (Physical Medicine and Rehabilitation) Olean Ree, MD as Consulting Physician (General Surgery) Lloyd Huger, MD as Consulting Physician (Oncology) Anell Barr, OD (Optometry) Rubie Maid, MD as Referring Physician (Obstetrics and Gynecology)   I connected with Kaylee Reyes on 08/07/21 at  2:30 PM EDT by video enabled telemedicine visit and verified that I am speaking with the correct person using two identifiers.   I discussed the limitations, risks, security and privacy concerns of performing an evaluation and management service by telemedicine and the availability of in-person appointments. I also discussed with the patient that there may be a patient responsible charge related to this service. The patient expressed understanding and agreed to proceed.   Other persons participating in the visit and their role in the encounter: Patient, MD.  Patient's location: Home. Provider's location: Clinic.  CHIEF COMPLAINT: Pathologic stage IIB ER/PR positive invasive lobular carcinoma of the right upper breast.  INTERVAL HISTORY: Patient agreed to video assisted telemedicine visit for further evaluation and routine 86-monthfollow-up.  Her only complaint today is of a persistent peripheral neuropathy.  She otherwise feels well.  She is tolerating letrozole without significant side effects.  She has no other neurologic complaints.  She denies any recent fevers or illnesses.  She denies any chest pain, shortness of breath, cough, or hemoptysis.  She has no nausea, vomiting, constipation, or  diarrhea. She has no urinary complaints.  Patient feels at her baseline offers no specific complaints today.  REVIEW OF SYSTEMS:   Review of Systems  Constitutional: Negative.  Negative for fever, malaise/fatigue and weight loss.  Respiratory: Negative.  Negative for cough and shortness of breath.   Cardiovascular: Negative.  Negative for chest pain and leg swelling.  Gastrointestinal: Negative.  Negative for abdominal pain and constipation.  Genitourinary: Negative.  Negative for dysuria.  Musculoskeletal: Negative.  Negative for back pain and joint pain.  Skin: Negative.  Negative for rash.  Neurological:  Positive for tingling and sensory change. Negative for dizziness, focal weakness, weakness and headaches.  Psychiatric/Behavioral: Negative.  Negative for memory loss. The patient is not nervous/anxious.    As per HPI. Otherwise, a complete review of systems is negative.  PAST MEDICAL HISTORY: Past Medical History:  Diagnosis Date   Anemia    Body tinea 10/01/2015   Previously treated by Dr. hKoleen Nimrod   GERD (gastroesophageal reflux disease)    History of breast cancer 09/2015   breast cancer   History of chicken pox    History of peptic ulcer disease 09/25/2015   Hyperlipidemia    Hypertension    Hypothyroidism    Neuropathy of foot    bilateral   Pinched nerve    PONV (postoperative nausea and vomiting)    exploratory surgery at [facility]   Thyroid disease    Tuberculosis exposure    Mother had TB.  Pt shows (+) on tests, but has never had.   Vertigo    last episode over 1 yr ago   Wears dentures    partial upper and lower    PAST SURGICAL HISTORY: Past Surgical History:  Procedure Laterality Date   ABDOMINAL HYSTERECTOMY  07/13/2013  Total. with BSO for postmenopausal bleeding and fibroids with large cervical polyp. Dr. Levy Sjogren and Dr. Glennon Mac at [place]  04/17/2011   Hepatomegaly with borderline slenomegaly. Suggestive of fatty liver. s/p  cholecystectomy. Portions of aorta obscured   BREAST SURGERY Right 2011   BREAST BIOPSY   CESAREAN SECTION  1988   CHOLECYSTECTOMY  1990   COLONOSCOPY WITH PROPOFOL N/A 09/01/2017   Procedure: COLONOSCOPY WITH PROPOFOL;  Surgeon: [name], MD;  Location: [facility];  Service: Gastroenterology;  Laterality: N/A;   DIAGNOSTIC LAPAROSCOPY     ESOPHAGOGASTRODUODENOSCOPY (EGD) WITH PROPOFOL N/A 01/17/2020   Procedure: ESOPHAGOGASTRODUODENOSCOPY (EGD) WITH PROPOFOL;  Surgeon: [name], MD;  Location: [facility];  Service: Endoscopy;  Laterality: N/A;   EXPLORATORY LAPAROTOMY     Left breast   MASTECTOMY     MASTECTOMY W/ SENTINEL NODE BIOPSY Bilateral 10/28/2015   Procedure: MASTECTOMY WITH SENTINEL LYMPH NODE BIOPSY;  Surgeon: Hubbard Robinson, MD;  Location: [facility];  Service: General;  Laterality: Bilateral;   PORT-A-CATH REMOVAL Left 10/11/2016   Procedure: REMOVAL PORT-A-CATH;  Surgeon: Hubbard Robinson, MD;  Location: [facility];  Service: General;  Laterality: Left;   PORTACATH PLACEMENT Left 12/23/2015   Procedure: INSERTION PORT-A-CATH;  Surgeon: Hubbard Robinson, MD;  Location: [facility];  Service: General;  Laterality: Left;   WRIST SURGERY      FAMILY HISTORY Family History  Problem Relation Age of Onset   Hypertension Mother    Thyroid disease Mother    Hypertension Brother    Diabetes Brother    Breast cancer Neg Hx    Ovarian cancer Neg Hx    Colon cancer Neg Hx        ADVANCED DIRECTIVES:    HEALTH MAINTENANCE: Social History   Tobacco Use   Smoking status: Never   Smokeless tobacco: Never  Vaping Use   Vaping Use: Never used  Substance Use Topics   Alcohol use: No    Alcohol/week: 0.0 standard drinks   Drug use: No     Colonoscopy:  PAP:  Bone density:  Lipid panel:  Allergies  Allergen Reactions   Penicillins Shortness Of Breath    Has patient had a PCN reaction causing immediate rash, facial/tongue/throat swelling, SOB or  lightheadedness with hypotension: Yes Has patient had a PCN reaction causing severe rash involving mucus membranes or skin necrosis: No Has patient had a PCN reaction that required hospitalization No Has patient had a PCN reaction occurring within the last 10 years: No If all of the above answers are "NO", then may proceed with Cephalosporin use.    Nickel Hives   Keflex [Cephalexin] Swelling    Throat swelling   Pravastatin Sodium Other (See Comments)    Myalgia     Current Outpatient Medications  Medication Sig Dispense Refill   atenolol (TENORMIN) 50 MG tablet TAKE 1 TABLET DAILY 90 tablet 1   B Complex-C (SUPER B COMPLEX PO) Take 1 tablet by mouth daily.     CALCIUM CITRATE PO Take 1 tablet by mouth daily.     Carboxymeth-Glycerin-Polysorb (REFRESH OPTIVE MEGA-3 OP) Place 1 drop into both eyes daily.     docusate sodium (COLACE) 100 MG capsule Take 200 mg by mouth daily.      ELDERBERRY PO Take by mouth daily.      fexofenadine (ALLEGRA) 180 MG tablet Take 180 mg by mouth daily.     hydrochlorothiazide (HYDRODIURIL) 25 MG tablet TAKE 1 TABLET BY  MOUTH EVERY DAY 90 tablet 3   ipratropium (ATROVENT) 0.06 % nasal spray Place 2 sprays into both nostrils 4 (four) times daily. 15 mL 12   ketoconazole (NIZORAL) 2 % cream Apply 1 application topically daily. 50 g 3   lansoprazole (PREVACID) 30 MG capsule TAKE 1 CAPSULE DAILY AT 12 NOON (NEED OFFICE VISIT PRIOR TO FURTHER REFILLS) (Patient taking differently: daily as needed.) 90 capsule 0   letrozole (FEMARA) 2.5 MG tablet Take 2.5 mg by mouth daily.     levothyroxine (SYNTHROID) 75 MCG tablet TAKE 1 TABLET DAILY 90 tablet 3   lidocaine (XYLOCAINE) 2 % solution      magic mouthwash SOLN 1 part 2% viscous lidocaine: 1 part Maalox: 1 part diphenhydramine 12.5 mg per 5 ml elixir. Rinse and spit 5 ml every six hour as needed 60 mL 3   Magnesium 400 MG TABS Take 600 mg by mouth daily.      meclizine (ANTIVERT) 12.5 MG tablet Take 1 tablet  (12.5 mg total) by mouth 3 (three) times daily as needed for dizziness. 90 tablet 1   Misc Natural Products (TURMERIC CURCUMIN) CAPS Take 1 capsule by mouth daily.     nystatin (MYCOSTATIN/NYSTOP) powder Apply 1 application topically 3 (three) times daily. 15 g 0   sodium chloride (OCEAN) 0.65 % SOLN nasal spray Place 1 spray into both nostrils as needed for congestion. 104 mL 0   triamcinolone cream (KENALOG) 0.5 % Apply 1 application topically 2 (two) times daily as needed. For itching 30 g 0   Vitamin D, Ergocalciferol, (DRISDOL) 1.25 MG (50000 UNIT) CAPS capsule TAKE 1 CAPSULE BY MOUTH 1 TIME A WEEK 12 capsule 1   vitamin E 180 MG (400 UNITS) capsule Take by mouth.     albuterol (VENTOLIN HFA) 108 (90 Base) MCG/ACT inhaler Inhale 2 puffs into the lungs every 6 (six) hours as needed for wheezing or shortness of breath. (Patient not taking: No sig reported) 18 g 2   atenolol (TENORMIN) 25 MG tablet Take 1 tablet (25 mg total) by mouth every evening. Take in addition to '50mg'$  in the morning. (Patient not taking: No sig reported) 90 tablet 1   benzonatate (TESSALON) 100 MG capsule Take 2 capsules (200 mg total) by mouth every 8 (eight) hours. (Patient not taking: No sig reported) 21 capsule 0   ibuprofen (ADVIL,MOTRIN) 200 MG tablet Take 400 mg by mouth every 6 (six) hours as needed for mild pain.  (Patient not taking: No sig reported)     lansoprazole (PREVACID) 15 MG capsule Take by mouth. (Patient not taking: Reported on 08/06/2021)     promethazine-dextromethorphan (PROMETHAZINE-DM) 6.25-15 MG/5ML syrup Take 5 mLs by mouth 4 (four) times daily as needed. (Patient not taking: No sig reported) 118 mL 0   No current facility-administered medications for this visit.    OBJECTIVE: There were no vitals filed for this visit.   There is no height or weight on file to calculate BMI.    ECOG FS:0 - Asymptomatic  General: Well-developed, well-nourished, no acute distress. HEENT: Normocephalic. Neuro:  Alert, answering all questions appropriately. Cranial nerves grossly intact. Psych: Normal affect.  LAB RESULTS:  Lab Results  Component Value Date   NA 137 02/27/2021   K 3.6 02/27/2021   CL 101 02/27/2021   CO2 27 02/27/2021   GLUCOSE 130 (H) 02/27/2021   BUN 14 02/27/2021   CREATININE 0.81 02/27/2021   CALCIUM 9.5 02/27/2021   PROT 8.1 02/27/2021   ALBUMIN  4.2 02/27/2021   AST 73 (H) 02/27/2021   ALT 66 (H) 02/27/2021   ALKPHOS 90 02/27/2021   BILITOT 0.8 02/27/2021   GFRNONAA >60 02/27/2021   GFRAA 85 09/12/2020    Lab Results  Component Value Date   WBC 6.2 02/27/2021   NEUTROABS 3.5 01/21/2021   HGB 11.5 (L) 02/27/2021   HCT 35.4 (L) 02/27/2021   MCV 88.3 02/27/2021   PLT 164 02/27/2021   Lab Results  Component Value Date   IRON 48 02/09/2016   TIBC 326 02/09/2016   IRONPCTSAT 15 02/09/2016     STUDIES: No results found.  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  PLAN:    1. Pathologic stage IIB ER/PR positive invasive lobular carcinoma of the right upper breast: Patient is now status post bilateral mastectomy, therefore does not require additional mammograms. She completed 4 cycles of Taxotere and Cytoxan on March 03, 2016.  She also completed adjuvant XRT.  Patient has now completed 5 years of adjuvant letrozole and plans to finish her current prescription and then discontinue.  No further interventions are needed.  Return to clinic in 1 year for further evaluation.   2.  Bone health: Patient's most recent bone mineral density on July 07, 2020 reported T score of 0.0 which is considered normal.  Continue to monitor every 2 years.  Repeat in July 2023.   3.  Peripheral neuropathy: Patient was given a referral to neuro oncology.  I provided 20 minutes of face-to-face video visit time during this encounter which included chart review, counseling, and coordination of care as documented  above.   Patient expressed understanding and was in agreement with this plan. She also understands that She can call clinic at any time with any questions, concerns, or complaints.   Invasive lobular carcinoma of right breast, stage 2 ([place])   Staging form: Breast, AJCC 7th Edition     Pathologic stage: Stage IIB (T2, N1a, M0) - Signed by Lloyd Huger, MD on 10/22/2015   Lloyd Huger, MD   08/07/2021 11:35 AM

## 2021-07-31 NOTE — Progress Notes (Signed)
Cardiology Office Note  Date:  08/03/2021   ID:  Kaylee Reyes, DOB 1951-10-05, MRN [id number]  PCP:  Birdie Sons, MD   Chief Complaint  Patient presents with   New Patient (Initial Visit)    Patient c/o LE edema with more on the right, hypotension and feeling exhausted. Medications reviewed by the patient verbally.     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 She has been losing weight  Prior hx of chemo, Chronic leg swelling Neuropathy , sleep issues Prior prescription of Neurontin, did not try it but is interested again Finishing "caner pill"/letrozole, has completed 5 years  Ct chest 01/2021 Minimal aortic athero  Lab work reviewed A1C 5.2 Total chol 240, off statin/myalgias  Echo reviewed NORMAL LEFT VENTRICULAR SYSTOLIC FUNCTION  NORMAL RIGHT VENTRICULAR SYSTOLIC FUNCTION  TRIVIAL REGURGITATION NOTED (See above)  NO VALVULAR STENOSIS    EKG personally reviewed by myself on todays visit NSR  rate 66 no St or T wave changes   PMH:   has a past medical history of Anemia, Body tinea (10/01/2015), GERD (gastroesophageal reflux disease), History of breast cancer (09/2015), History of chicken pox, History of peptic ulcer disease (09/25/2015), Hyperlipidemia, Hypertension, Hypothyroidism, Neuropathy of foot, Pinched nerve, PONV (postoperative nausea and vomiting), Thyroid disease, Tuberculosis exposure, Vertigo,  and Wears dentures.  PSH:    Past Surgical History:  Procedure Laterality Date   ABDOMINAL HYSTERECTOMY  07/13/2013   Total. with BSO for postmenopausal bleeding and fibroids with large cervical polyp. Dr. Levy Sjogren and Dr. Glennon Mac at [place]  04/17/2011   Hepatomegaly with borderline slenomegaly. Suggestive of fatty liver. s/p cholecystectomy. Portions of aorta obscured   BREAST SURGERY Right 2011   BREAST BIOPSY   CESAREAN SECTION  1988   CHOLECYSTECTOMY  1990   COLONOSCOPY WITH PROPOFOL N/A 09/01/2017   Procedure: COLONOSCOPY WITH PROPOFOL;  Surgeon: [name], MD;  Location: [facility];  Service: Gastroenterology;  Laterality: N/A;   DIAGNOSTIC LAPAROSCOPY     ESOPHAGOGASTRODUODENOSCOPY (EGD) WITH PROPOFOL N/A 01/17/2020   Procedure: ESOPHAGOGASTRODUODENOSCOPY (EGD) WITH PROPOFOL;  Surgeon: [name], MD;  Location: [facility];  Service: Endoscopy;  Laterality: N/A;   EXPLORATORY LAPAROTOMY     Left breast   MASTECTOMY     MASTECTOMY W/ SENTINEL NODE BIOPSY Bilateral 10/28/2015   Procedure: MASTECTOMY WITH SENTINEL LYMPH NODE BIOPSY;  Surgeon: Hubbard Robinson, MD;  Location: [facility];  Service: General;  Laterality: Bilateral;   PORT-A-CATH REMOVAL Left 10/11/2016   Procedure: REMOVAL PORT-A-CATH;  Surgeon: Hubbard Robinson, MD;  Location: [facility];  Service: General;  Laterality: Left;   PORTACATH PLACEMENT Left 12/23/2015   Procedure: INSERTION PORT-A-CATH;  Surgeon: Hubbard Robinson, MD;  Location: [facility];  Service: General;  Laterality: Left;   WRIST SURGERY      Current Outpatient Medications  Medication Sig Dispense Refill   atenolol (TENORMIN) 50 MG tablet TAKE 1 TABLET DAILY 90 tablet 1  B Complex-C (SUPER B COMPLEX PO) Take 1 tablet by mouth daily.     CALCIUM CITRATE PO Take 1 tablet by mouth daily.     Carboxymeth-Glycerin-Polysorb (REFRESH OPTIVE MEGA-3 OP) Place 1 drop into both eyes daily.     docusate sodium (COLACE) 100  MG capsule Take 200 mg by mouth daily.      ELDERBERRY PO Take by mouth daily.      fexofenadine (ALLEGRA) 180 MG tablet Take 180 mg by mouth daily.     hydrochlorothiazide (HYDRODIURIL) 25 MG tablet TAKE 1 TABLET BY MOUTH EVERY DAY 90 tablet 3   ipratropium (ATROVENT) 0.06 % nasal spray Place 2 sprays into both nostrils 4 (four) times daily. 15 mL 12   ketoconazole (NIZORAL) 2 % cream Apply 1 application topically daily. 50 g 3   lansoprazole (PREVACID) 30 MG capsule TAKE 1 CAPSULE DAILY AT 12 NOON (NEED OFFICE VISIT PRIOR TO FURTHER REFILLS) 90 capsule 0   levothyroxine (SYNTHROID) 75 MCG tablet TAKE 1 TABLET DAILY 90 tablet 3   lidocaine (XYLOCAINE) 2 % solution      magic mouthwash SOLN 1 part 2% viscous lidocaine: 1 part Maalox: 1 part diphenhydramine 12.5 mg per 5 ml elixir. Rinse and spit 5 ml every six hour as needed 60 mL 3   Magnesium 400 MG TABS Take 600 mg by mouth daily.      meclizine (ANTIVERT) 12.5 MG tablet Take 1 tablet (12.5 mg total) by mouth 3 (three) times daily as needed for dizziness. 90 tablet 1   Misc Natural Products (TURMERIC CURCUMIN) CAPS Take 1 capsule by mouth daily.     nystatin (MYCOSTATIN/NYSTOP) powder Apply 1 application topically 3 (three) times daily. 15 g 0   sodium chloride (OCEAN) 0.65 % SOLN nasal spray Place 1 spray into both nostrils as needed for congestion. 104 mL 0   triamcinolone cream (KENALOG) 0.5 % Apply 1 application topically 2 (two) times daily as needed. For itching 30 g 0   Vitamin D, Ergocalciferol, (DRISDOL) 1.25 MG (50000 UNIT) CAPS capsule TAKE 1 CAPSULE BY MOUTH 1 TIME A WEEK 12 capsule 1   vitamin E 180 MG (400 UNITS) capsule Take by mouth.     albuterol (VENTOLIN HFA) 108 (90 Base) MCG/ACT inhaler Inhale 2 puffs into the lungs every 6 (six) hours as needed for wheezing or shortness of breath. (Patient not taking: No sig reported) 18 g 2   atenolol (TENORMIN) 25 MG tablet Take 1 tablet (25 mg total) by mouth every evening. Take in  addition to '50mg'$  in the morning. (Patient not taking: No sig reported) 90 tablet 1   benzonatate (TESSALON) 100 MG capsule Take 2 capsules (200 mg total) by mouth every 8 (eight) hours. (Patient not taking: Reported on 08/03/2021) 21 capsule 0   ibuprofen (ADVIL,MOTRIN) 200 MG tablet Take 400 mg by mouth every 6 (six) hours as needed for mild pain.  (Patient not taking: Reported on 08/03/2021)     lansoprazole (PREVACID) 15 MG capsule Take by mouth.     promethazine-dextromethorphan (PROMETHAZINE-DM) 6.25-15 MG/5ML syrup Take 5 mLs by mouth 4 (four) times daily as needed. (Patient not taking: Reported on 08/03/2021) 118 mL 0   No current facility-administered medications for this visit.     Allergies:   Penicillins, Nickel, Keflex [cephalexin], and Pravastatin sodium   Social History:  The patient  reports that she has never smoked. She has never used smokeless tobacco. She reports that she does not drink alcohol and  does not use drugs.   Family History:   family history includes Diabetes in her brother; Hypertension in her brother and mother; Thyroid disease in her mother.    Review of Systems: Review of Systems  Constitutional: Negative.   HENT: Negative.    Respiratory: Negative.    Cardiovascular: Negative.   Gastrointestinal: Negative.   Musculoskeletal: Negative.   Neurological: Negative.   Psychiatric/Behavioral: Negative.    All other systems reviewed and are negative.   PHYSICAL EXAM: VS:  BP 138/86 (BP Location: Right Wrist, Patient Position: Sitting, Cuff Size: Large)   Pulse 66   Ht '5\' 6"'$  (1.676 m)   Wt 269 lb (122 kg)   SpO2 98%   BMI 43.42 kg/m  , BMI Body mass index is 43.42 kg/m. GEN: Well nourished, well developed, in no acute distress HEENT: normal Neck: no JVD, carotid bruits, or masses Cardiac: RRR; no murmurs, rubs, or gallops,no edema  Respiratory:  clear to auscultation bilaterally, normal work of breathing GI: soft, nontender, nondistended, + BS MS:  no deformity or atrophy Skin: warm and dry, no rash Neuro:  Strength and sensation are intact Psych: euthymic mood, full affect    Recent Labs: 01/21/2021: Magnesium 2.0 02/23/2021: BNP 17.3; TSH 2.950 02/27/2021: ALT 66; BUN 14; Creatinine, Ser 0.81; Hemoglobin 11.5; Platelets 164; Potassium 3.6; Sodium 137    Lipid Panel Lab Results  Component Value Date   CHOL 247 (H) 07/15/2020   HDL 39 (L) 07/15/2020   LDLCALC 179 (H) 07/15/2020   TRIG 154 (H) 07/15/2020      Wt Readings from Last 3 Encounters:  08/03/21 269 lb (122 kg)  06/15/21 274 lb (124.3 kg)  04/20/21 275 lb (124.7 kg)       ASSESSMENT AND PLAN:  Problem List Items Addressed This Visit       Cardiology Problems   Hypertension - Primary   Relevant Orders   EKG 12-Lead   Hypercholesterolemia     Other   Morbid obesity ([place])   Other Visit Diagnoses     Leg swelling          Essential hypertension Typically with a low diastolic pressures at home, elevated systolic Blood pressure numbers reviewed, Recommend HCTZ as she is taking in the morning, move the atenolol to the evening For systolic pressures over [id number] could take extra dose of HCTZ or extra half dose of atenolol Will try to avoid adding new medications at this time at her request  Hyperlipidemia Dramatic weight loss over the past year, recommended medical management for now with weight loss, walking program as tolerated Minimal aortic atherosclerosis on CT scan February 2022  Morbid obesity We have encouraged continued exercise, careful diet management in an effort to lose weight.  Leg swelling Likely exacerbated by weight, venous insufficiency Symptoms are mild, may improve with weight loss Consider leg elevation, compression hose    Total encounter time more than 60 minutes  Greater than 50% was spent in counseling and coordination of care with the patient    Signed, Esmond Plants, M.D., Ph.D. [place],  [place]

## 2021-08-03 ENCOUNTER — Other Ambulatory Visit: Payer: Self-pay

## 2021-08-03 ENCOUNTER — Ambulatory Visit (INDEPENDENT_AMBULATORY_CARE_PROVIDER_SITE_OTHER): Payer: Medicare Other | Admitting: Cardiovascular Disease

## 2021-08-03 ENCOUNTER — Encounter: Payer: Self-pay | Admitting: Cardiovascular Disease

## 2021-08-03 VITALS — BP 138/86 | HR 66 | Ht 66.0 in | Wt 269.0 lb

## 2021-08-03 DIAGNOSIS — M7989 Other specified soft tissue disorders: Secondary | ICD-10-CM

## 2021-08-03 DIAGNOSIS — I10 Essential (primary) hypertension: Secondary | ICD-10-CM

## 2021-08-03 DIAGNOSIS — E7800 Pure hypercholesterolemia, unspecified: Secondary | ICD-10-CM | POA: Diagnosis not present

## 2021-08-03 NOTE — Patient Instructions (Addendum)
Medication Instructions:  No changes Consider moving the atenolol to the pm  For for systolic pressure> [id number] Consider extra 1/2 atenolol (25 mg) and/or whole HCTZ  If you need a refill on your cardiac medications before your next appointment, please call your pharmacy.   Lab work: No new labs needed  Testing/Procedures: No new testing needed  Follow-Up: At [facility], you and your health needs are our priority.  As part of our continuing mission to provide you with exceptional heart care, we have created designated Provider Care Teams.  These Care Teams include your primary Cardiologist (physician) and Advanced Practice Providers (APPs -  Physician Assistants and Nurse Practitioners) who all work together to provide you with the care you need, when you need it.  You will need a follow up appointment as needed  Providers on your designated Care Team:   Murray Hodgkins, NP Christell Faith, PA-C Marrianne Mood, PA-C Cadence Potrero, [place]  COVID-19 Vaccine Information can be found at: [email or web address].uk For questions related to vaccine distribution or appointments, please email [email or web address] or call [phone].

## 2021-08-06 ENCOUNTER — Encounter: Payer: Self-pay | Admitting: Oncology

## 2021-08-06 ENCOUNTER — Inpatient Hospital Stay: Payer: Medicare Other | Attending: Oncology | Admitting: Oncology

## 2021-08-06 ENCOUNTER — Other Ambulatory Visit: Payer: Self-pay

## 2021-08-06 ENCOUNTER — Telehealth: Payer: Self-pay | Admitting: *Deleted

## 2021-08-06 DIAGNOSIS — C50911 Malignant neoplasm of unspecified site of right female breast: Secondary | ICD-10-CM | POA: Diagnosis not present

## 2021-08-06 DIAGNOSIS — Z170 Estrogen receptor positive status [ER+]: Secondary | ICD-10-CM | POA: Diagnosis not present

## 2021-08-06 DIAGNOSIS — G629 Polyneuropathy, unspecified: Secondary | ICD-10-CM

## 2021-08-06 DIAGNOSIS — I10 Essential (primary) hypertension: Secondary | ICD-10-CM | POA: Insufficient documentation

## 2021-08-06 DIAGNOSIS — Z9013 Acquired absence of bilateral breasts and nipples: Secondary | ICD-10-CM | POA: Insufficient documentation

## 2021-08-06 DIAGNOSIS — Z79811 Long term (current) use of aromatase inhibitors: Secondary | ICD-10-CM | POA: Insufficient documentation

## 2021-08-06 DIAGNOSIS — E785 Hyperlipidemia, unspecified: Secondary | ICD-10-CM | POA: Insufficient documentation

## 2021-08-06 DIAGNOSIS — E039 Hypothyroidism, unspecified: Secondary | ICD-10-CM | POA: Insufficient documentation

## 2021-08-06 DIAGNOSIS — Z923 Personal history of irradiation: Secondary | ICD-10-CM | POA: Insufficient documentation

## 2021-08-06 DIAGNOSIS — Z79899 Other long term (current) drug therapy: Secondary | ICD-10-CM | POA: Insufficient documentation

## 2021-08-06 NOTE — Progress Notes (Signed)
Patient verified using two identifiers for virtual visit via telephone today.   Patient would like to discuss worsening neuropathy and willing to try Gabapentin.

## 2021-08-06 NOTE — Telephone Encounter (Signed)
RN called pt to review rooming information and medications for my chart video visit.  RN left message that appt is scheduled for today at 230pm and she would receive a link to connect for visit.

## 2021-08-14 ENCOUNTER — Encounter: Payer: Self-pay | Admitting: Internal Medicine

## 2021-08-14 ENCOUNTER — Inpatient Hospital Stay (HOSPITAL_BASED_OUTPATIENT_CLINIC_OR_DEPARTMENT_OTHER): Payer: Medicare Other | Admitting: Internal Medicine

## 2021-08-14 ENCOUNTER — Other Ambulatory Visit: Payer: Self-pay

## 2021-08-14 DIAGNOSIS — G620 Drug-induced polyneuropathy: Secondary | ICD-10-CM

## 2021-08-14 DIAGNOSIS — T451X5A Adverse effect of antineoplastic and immunosuppressive drugs, initial encounter: Secondary | ICD-10-CM | POA: Diagnosis not present

## 2021-08-14 DIAGNOSIS — E785 Hyperlipidemia, unspecified: Secondary | ICD-10-CM | POA: Diagnosis not present

## 2021-08-14 DIAGNOSIS — Z9013 Acquired absence of bilateral breasts and nipples: Secondary | ICD-10-CM | POA: Diagnosis not present

## 2021-08-14 DIAGNOSIS — C50911 Malignant neoplasm of unspecified site of right female breast: Secondary | ICD-10-CM | POA: Diagnosis not present

## 2021-08-14 DIAGNOSIS — E039 Hypothyroidism, unspecified: Secondary | ICD-10-CM | POA: Diagnosis not present

## 2021-08-14 DIAGNOSIS — Z79899 Other long term (current) drug therapy: Secondary | ICD-10-CM | POA: Diagnosis not present

## 2021-08-14 DIAGNOSIS — Z923 Personal history of irradiation: Secondary | ICD-10-CM | POA: Diagnosis not present

## 2021-08-14 DIAGNOSIS — Z170 Estrogen receptor positive status [ER+]: Secondary | ICD-10-CM | POA: Diagnosis not present

## 2021-08-14 DIAGNOSIS — I10 Essential (primary) hypertension: Secondary | ICD-10-CM | POA: Diagnosis not present

## 2021-08-14 DIAGNOSIS — Z79811 Long term (current) use of aromatase inhibitors: Secondary | ICD-10-CM | POA: Diagnosis not present

## 2021-08-14 NOTE — Progress Notes (Signed)
New patient evaluation with Dr. Mickeal Skinner for continued neuropathy.

## 2021-08-14 NOTE — Progress Notes (Signed)
[facility] [place], [place] [address] [phone]   New Patient Evaluation  Date of Service: 08/14/21 Patient Name: Kaylee Reyes Patient MRN: [id number] Patient DOB: 1951-06-24 Provider: Ventura Sellers, MD  Identifying Statement:  Kaylee Reyes is a 70 y.o. female with Chemotherapy-induced neuropathy [facility]) who presents for initial consultation and evaluation regarding cancer associated neurologic deficits.    Referring Provider: Birdie Sons, MD [address] [place],  [place] [address]  Primary Cancer:  Oncologic History: Oncology History Overview Note  Patient with initial diagnosis in  2016 who noticed an enlarging mass in the right breast on self-examination.  Mammogram revealed a 3.6 cm mass of the right breast.  This was followed by a breast MRI showing another 4 mm lesion in the right breast as well as an 8 mm mass in the central left breast.  The biopsy-proven malignancy in this right breast measures 4.8 cm PET scan revealed no evidence of hypermetabolic activity.  Patient opted for bilateral mastectomies (10/2015).  Margins were clear.  2 out of 6 sentinel lymph nodes were positive for metastatic disease.  Course was complicated by bilateral flap necrosis and infection. Completed 4 cycles of Cytoxan and Taxotere in March 2017.  Completed radiation  to right chest wall and peripheral lymphatics in July 2017.  Began letrozole in July 2017.  DEXA scan revealed a T score of -0.8 at initiation of letrozole   Invasive lobular carcinoma of right breast, stage 2 ([facility])  10/01/2015 Initial Diagnosis   Invasive lobular carcinoma of right breast, stage 2 (HCC)     History of Present Illness: The patient's records from the referring physician were obtained and reviewed and the patient interviewed to confirm this HPI.  Kaylee Reyes presents today to review neuropathic symptoms.  She describes several years  history of numbness affecting the bottoms of her feet.  This has not moved or spread over time, there is no pain or burning component.  She also describes pain "shooting down back of right leg", always when she lays flat or has legs extended.  This has also been present for several years, no significant change in it.  Last taxol chemotherapy was in 2017.   Medications: Current Outpatient Medications on File Prior to Visit  Medication Sig Dispense Refill   atenolol (TENORMIN) 25 MG tablet Take 1 tablet (25 mg total) by mouth every evening. Take in addition to '50mg'$  in the morning. (Patient taking differently: Take 25 mg by mouth in the morning. Take in addition to '50mg'$  in the morning.) 90 tablet 1   atenolol (TENORMIN) 50 MG tablet TAKE 1 TABLET DAILY 90 tablet 1   B Complex-C (SUPER B COMPLEX PO) Take 1 tablet by mouth daily.     CALCIUM CITRATE PO Take 1 tablet by mouth daily.     Carboxymeth-Glycerin-Polysorb (REFRESH OPTIVE MEGA-3 OP) Place 1 drop into both eyes daily.     docusate sodium (COLACE) 100 MG capsule Take 200 mg by mouth daily.      ELDERBERRY PO Take by mouth daily.      fexofenadine (ALLEGRA) 180 MG tablet Take 180 mg by mouth daily.     hydrochlorothiazide (HYDRODIURIL) 25 MG tablet TAKE 1 TABLET BY MOUTH EVERY DAY 90 tablet 3   ipratropium (ATROVENT) 0.06 % nasal spray Place 2 sprays into both nostrils 4 (four) times daily. 15 mL 12   ketoconazole (NIZORAL) 2 % cream  Apply 1 application topically daily. 50 g 3   lansoprazole (PREVACID) 30 MG capsule TAKE 1 CAPSULE DAILY AT 12 NOON (NEED OFFICE VISIT PRIOR TO FURTHER REFILLS) (Patient taking differently: daily as needed.) 90 capsule 0   letrozole (FEMARA) 2.5 MG tablet Take 2.5 mg by mouth daily.     levothyroxine (SYNTHROID) 75 MCG tablet TAKE 1 TABLET DAILY 90 tablet 3   lidocaine (XYLOCAINE) 2 % solution      magic mouthwash SOLN 1 part 2% viscous lidocaine: 1 part Maalox: 1 part diphenhydramine 12.5 mg per 5 ml elixir. Rinse  and spit 5 ml every six hour as needed 60 mL 3   Magnesium 400 MG TABS Take 600 mg by mouth daily.      meclizine (ANTIVERT) 12.5 MG tablet Take 1 tablet (12.5 mg total) by mouth 3 (three) times daily as needed for dizziness. 90 tablet 1   Misc Natural Products (TURMERIC CURCUMIN) CAPS Take 1 capsule by mouth daily.     nystatin (MYCOSTATIN/NYSTOP) powder Apply 1 application topically 3 (three) times daily. 15 g 0   sodium chloride (OCEAN) 0.65 % SOLN nasal spray Place 1 spray into both nostrils as needed for congestion. 104 mL 0   triamcinolone cream (KENALOG) 0.5 % Apply 1 application topically 2 (two) times daily as needed. For itching 30 g 0   Vitamin D, Ergocalciferol, (DRISDOL) 1.25 MG (50000 UNIT) CAPS capsule TAKE 1 CAPSULE BY MOUTH 1 TIME A WEEK 12 capsule 1   vitamin E 180 MG (400 UNITS) capsule Take by mouth.     albuterol (VENTOLIN HFA) 108 (90 Base) MCG/ACT inhaler Inhale 2 puffs into the lungs every 6 (six) hours as needed for wheezing or shortness of breath. (Patient not taking: No sig reported) 18 g 2   benzonatate (TESSALON) 100 MG capsule Take 2 capsules (200 mg total) by mouth every 8 (eight) hours. (Patient not taking: No sig reported) 21 capsule 0   ibuprofen (ADVIL,MOTRIN) 200 MG tablet Take 400 mg by mouth every 6 (six) hours as needed for mild pain.  (Patient not taking: No sig reported)     lansoprazole (PREVACID) 15 MG capsule Take by mouth. (Patient not taking: No sig reported)     promethazine-dextromethorphan (PROMETHAZINE-DM) 6.25-15 MG/5ML syrup Take 5 mLs by mouth 4 (four) times daily as needed. (Patient not taking: No sig reported) 118 mL 0   [DISCONTINUED] fluticasone (FLONASE) 50 MCG/ACT nasal spray Place 2 sprays into both nostrils daily. 18.2 g 0   No current facility-administered medications on file prior to visit.    Allergies:  Allergies  Allergen Reactions   Penicillins Shortness Of Breath    Has patient had a PCN reaction causing immediate rash,  facial/tongue/throat swelling, SOB or lightheadedness with hypotension: Yes Has patient had a PCN reaction causing severe rash involving mucus membranes or skin necrosis: No Has patient had a PCN reaction that required hospitalization No Has patient had a PCN reaction occurring within the last 10 years: No If all of the above answers are "NO", then may proceed with Cephalosporin use.    Nickel Hives   Keflex [Cephalexin] Swelling    Throat swelling   Pravastatin Sodium Other (See Comments)    Myalgia    Past Medical History:  Past Medical History:  Diagnosis Date   Anemia    Body tinea 10/01/2015   Previously treated by Dr. Koleen Nimrod    GERD (gastroesophageal reflux disease)    History of breast cancer 09/2015  breast cancer   History of chicken pox    History of peptic ulcer disease 09/25/2015   Hyperlipidemia    Hypertension    Hypothyroidism    Neuropathy of foot    bilateral   Pinched nerve    PONV (postoperative nausea and vomiting)    exploratory surgery at [facility]   Thyroid disease    Tuberculosis exposure    Mother had TB.  Pt shows (+) on tests, but has never had.   Vertigo    last episode over 1 yr ago   Wears dentures    partial upper and lower   Past Surgical History:  Past Surgical History:  Procedure Laterality Date   ABDOMINAL HYSTERECTOMY  07/13/2013   Total. with BSO for postmenopausal bleeding and fibroids with large cervical polyp. Dr. Levy Sjogren and Dr. Glennon Mac at [facility]  04/17/2011   Hepatomegaly with borderline slenomegaly. Suggestive of fatty liver. s/p cholecystectomy. Portions of aorta obscured   BREAST SURGERY Right 2011   BREAST BIOPSY   CESAREAN SECTION  1988   CHOLECYSTECTOMY  1990   COLONOSCOPY WITH PROPOFOL N/A 09/01/2017   Procedure: COLONOSCOPY WITH PROPOFOL;  Surgeon: [name], MD;  Location: [facility];  Service: Gastroenterology;  Laterality: N/A;   DIAGNOSTIC LAPAROSCOPY      ESOPHAGOGASTRODUODENOSCOPY (EGD) WITH PROPOFOL N/A 01/17/2020   Procedure: ESOPHAGOGASTRODUODENOSCOPY (EGD) WITH PROPOFOL;  Surgeon: [name], MD;  Location: [facility];  Service: Endoscopy;  Laterality: N/A;   EXPLORATORY LAPAROTOMY     Left breast   MASTECTOMY     MASTECTOMY W/ SENTINEL NODE BIOPSY Bilateral 10/28/2015   Procedure: MASTECTOMY WITH SENTINEL LYMPH NODE BIOPSY;  Surgeon: Hubbard Robinson, MD;  Location: [facility];  Service: General;  Laterality: Bilateral;   PORT-A-CATH REMOVAL Left 10/11/2016   Procedure: REMOVAL PORT-A-CATH;  Surgeon: Hubbard Robinson, MD;  Location: [facility];  Service: General;  Laterality: Left;   PORTACATH PLACEMENT Left 12/23/2015   Procedure: INSERTION PORT-A-CATH;  Surgeon: Hubbard Robinson, MD;  Location: [facility];  Service: General;  Laterality: Left;   WRIST SURGERY     Social History:  Social History   Socioeconomic History   Marital status: Married    Spouse name: Not on file   Number of children: 2   Years of education: Not on file   Highest education level: Some college, no degree  Occupational History   Occupation: retired  Tobacco Use   Smoking status: Never   Smokeless tobacco: Never  Vaping Use   Vaping Use: Never used  Substance and Sexual Activity   Alcohol use: No    Alcohol/week: 0.0 standard drinks   Drug use: No   Sexual activity: Never    Birth control/protection: Surgical  Other Topics Concern   Not on file  Social History Narrative   Not on file   Social Determinants of Health   Financial Resource Strain: Not on file  Food Insecurity: Not on file  Transportation Needs: Not on file  Physical Activity: Not on file  Stress: Not on file  Social Connections: Not on file  Intimate Partner Violence: Not on file   Family History:  Family History  Problem Relation Age of Onset   Hypertension Mother    Thyroid disease Mother    Hypertension Brother    Diabetes Brother    Breast cancer Neg Hx     Ovarian cancer Neg Hx    Colon cancer Neg Hx     Review  of Systems: Constitutional: Doesn't report fevers, chills or abnormal weight loss Eyes: Doesn't report blurriness of vision Ears, nose, mouth, throat, and face: Doesn't report sore throat Respiratory: Doesn't report cough, dyspnea or wheezes Cardiovascular: Doesn't report palpitation, chest discomfort  Gastrointestinal:  Doesn't report nausea, constipation, diarrhea GU: Doesn't report incontinence Skin: Doesn't report skin rashes Neurological: Per HPI Musculoskeletal: Doesn't report joint pain Behavioral/Psych: Doesn't report anxiety  Physical Exam: Vitals:   08/14/21 0959  BP: (!) 179/80  Pulse: 69  Resp: 18  Temp: 99.6 F (37.6 C)   KPS: 80. General: Alert, cooperative, pleasant, in no acute distress Head: Normal EENT: No conjunctival injection or scleral icterus.  Lungs: Resp effort normal Cardiac: Regular rate Abdomen: Non-distended abdomen Skin: No rashes cyanosis or petechiae. Extremities: No clubbing or edema  Neurologic Exam: Mental Status: Awake, alert, attentive to examiner. Oriented to self and environment. Language is fluent with intact comprehension.  Cranial Nerves: Visual acuity is grossly normal. Visual fields are full. Extra-ocular movements intact. No ptosis. Face is symmetric Motor: Tone and bulk are normal. Power is full in both arms and legs. Reflexes are symmetric, no pathologic reflexes present.  Sensory: Intact to light touch Gait: Normal.   Labs: I have reviewed the data as listed    Component Value Date/Time   NA 137 02/27/2021 0215   NA 140 02/23/2021 0000   NA 136 07/15/2013 1057   K 3.6 02/27/2021 0215   K 4.1 07/15/2013 1057   CL 101 02/27/2021 0215   CL 102 07/15/2013 1057   CO2 27 02/27/2021 0215   CO2 27 07/15/2013 1057   GLUCOSE 130 (H) 02/27/2021 0215   GLUCOSE 112 (H) 07/15/2013 1057   BUN 14 02/27/2021 0215   BUN 10 02/23/2021 0000   BUN 10 07/15/2013 1057    CREATININE 0.81 02/27/2021 0215   CREATININE 0.98 07/15/2013 1057   CALCIUM 9.5 02/27/2021 0215   CALCIUM 8.2 (L) 07/15/2013 1057   PROT 8.1 02/27/2021 0215   PROT 7.3 02/23/2021 0000   ALBUMIN 4.2 02/27/2021 0215   ALBUMIN 4.2 02/23/2021 0000   AST 73 (H) 02/27/2021 0215   ALT 66 (H) 02/27/2021 0215   ALKPHOS 90 02/27/2021 0215   BILITOT 0.8 02/27/2021 0215   BILITOT 0.3 02/23/2021 0000   GFRNONAA >60 02/27/2021 0215   GFRNONAA >60 07/15/2013 1057   GFRAA 85 09/12/2020 1339   GFRAA >60 07/15/2013 1057   Lab Results  Component Value Date   WBC 6.2 02/27/2021   NEUTROABS 3.5 01/21/2021   HGB 11.5 (L) 02/27/2021   HCT 35.4 (L) 02/27/2021   MCV 88.3 02/27/2021   PLT 164 02/27/2021      Assessment/Plan Chemotherapy-induced neuropathy (HCC)  Kaylee Reyes presents with clinical syndrome consistent with symmetric, length dependent, small and large fiber peripheral neuropathy.  Etiology is exposure to chemotherapy.  We reviewed pathophysiology of chemotherapy induced neuropathy, available treatments, and goals of care.  We recommended initiating trial of gabapentin, starting with '300mg'$  BID.  She understands this will not address the numbness symptoms, only the pain component, which is sciatic in nature.  We spent twenty additional minutes teaching regarding the natural history, biology, and historical experience in the treatment of neurologic complications of cancer.   We appreciate the opportunity to participate in the care of AYLLA BRAMEL.  She may follow up as needed or with progressive symptoms.  All questions were answered. The patient knows to call the clinic with any problems, questions or concerns. No  barriers to learning were detected.  The total time spent in the encounter was 40 minutes and more than 50% was on counseling and review of test results   Ventura Sellers, MD Medical Director of Neuro-Oncology [facility] [place] 08/14/21  10:00 AM

## 2021-08-19 ENCOUNTER — Other Ambulatory Visit: Payer: Self-pay | Admitting: Family Medicine

## 2021-08-19 NOTE — Telephone Encounter (Signed)
LOV:  06/15/2021 NOV: 09/15/2021   Last Refill:  01/02/2021 #90 1 Refill.  Thanks,   -Mickel Baas

## 2021-08-19 NOTE — Telephone Encounter (Signed)
Requested medication (s) are due for refill today:yes  Requested medication (s) are on the active medication list: yes  Last refill: 01/02/21  #90  1 refill  Future visit scheduled yes   Notes to clinic:  Not delegated  Requested Prescriptions  Pending Prescriptions Disp Refills   meclizine (ANTIVERT) 12.5 MG tablet [Pharmacy Med Name: MECLIZINE 12.'5MG'$  (RX) TABLETS] 90 tablet 1    Sig: TAKE 1 TABLET(12.5 MG) BY MOUTH THREE TIMES DAILY AS NEEDED FOR DIZZINESS     Not Delegated - Gastroenterology: Antiemetics Failed - 08/19/2021  9:31 AM      Failed - This refill cannot be delegated      Passed - Valid encounter within last 6 months    Recent Outpatient Visits           2 months ago Primary hypertension   [facility] Birdie Sons, MD   5 months ago [place], Kirstie Peri, MD   7 months ago [place], [place]   8 months ago Sinusitis, unspecified chronicity, unspecified location   [facility] [place] [facility] [place], Kelby Aline, FNP   11 months ago Urinary tract infection without hematuria, site unspecified   [facility], [place], [place]

## 2021-08-25 ENCOUNTER — Other Ambulatory Visit: Payer: Self-pay | Admitting: Family Medicine

## 2021-09-01 ENCOUNTER — Other Ambulatory Visit: Payer: Self-pay | Admitting: Internal Medicine

## 2021-09-01 ENCOUNTER — Telehealth: Payer: Self-pay | Admitting: *Deleted

## 2021-09-01 MED ORDER — GABAPENTIN 300 MG PO CAPS: 300.0000 mg | ORAL_CAPSULE | Freq: Two times a day (BID) | ORAL | 3 refills | Status: DC

## 2021-09-01 NOTE — Telephone Encounter (Signed)
Patient called reporting that Dr Mickeal Skinner had offered to start her on Gabapentin and she had refused, but has now changed her mind and would like to try Gabapentin. Asking if Dr Mickeal Skinner or Dr Grayland Ormond will order it for her

## 2021-09-05 ENCOUNTER — Other Ambulatory Visit: Payer: Self-pay | Admitting: Family Medicine

## 2021-09-05 ENCOUNTER — Other Ambulatory Visit: Payer: Self-pay

## 2021-09-05 ENCOUNTER — Ambulatory Visit
Admission: EM | Admit: 2021-09-05 | Discharge: 2021-09-05 | Disposition: A | Discharge status: Home / Self Care | Payer: Medicare Other | Attending: Emergency Medicine | Admitting: Emergency Medicine

## 2021-09-05 DIAGNOSIS — N390 Urinary tract infection, site not specified: Secondary | ICD-10-CM | POA: Insufficient documentation

## 2021-09-05 DIAGNOSIS — R319 Hematuria, unspecified: Secondary | ICD-10-CM | POA: Diagnosis not present

## 2021-09-05 DIAGNOSIS — I10 Essential (primary) hypertension: Secondary | ICD-10-CM

## 2021-09-05 MED ORDER — PHENAZOPYRIDINE HCL 200 MG PO TABS: 200.0000 mg | ORAL_TABLET | Freq: Three times a day (TID) | ORAL | 0 refills | Status: DC | PRN

## 2021-09-05 MED ORDER — NITROFURANTOIN MONOHYD MACRO 100 MG PO CAPS: 100.0000 mg | ORAL_CAPSULE | Freq: Two times a day (BID) | ORAL | 0 refills | Status: AC

## 2021-09-05 NOTE — Discharge Instructions (Addendum)
Increase your fluid intake.  The Pyridium will turn your urine orange, but will help with your symptoms.  Finish the [employer], even if you feel better.  We will contact you if we need to change your antibiotics

## 2021-09-05 NOTE — ED Provider Notes (Signed)
HPI  SUBJECTIVE:  Kaylee Reyes is a 70 y.o. female who presents with a "UTI" with dysuria over the past 4 days.  She reports urinary urgency, frequency, cloudy urine and urinating small amounts at a time starting today.  She reports worsening back pain, more than usual since also starting today.  No odorous urine, hematuria, nausea, vomiting, fevers, abdominal, pelvic pain.  No vaginal complaints.  She has not been sexually active in "years".  No flourescin soaps, body lotions.  No recent antibiotics.  No antipyretic in the past 6 hours.  She tried increasing her fluids without improvement in her symptoms.  No aggravating factors..  She has a past medical history of UTI which responded well to [place].  She also has a history of fatty liver disease, breast cancer, hypertension.  No history of STDs, BV, yeast, chronic kidney disease, pyelonephritis, nephrolithiasis, diabetes.  [id number], Kirstie Peri, MD    Past Medical History:  Diagnosis Date   Anemia    Body tinea 10/01/2015   Previously treated by Dr. Koleen Nimrod    GERD (gastroesophageal reflux disease)    History of breast cancer 09/2015   breast cancer   History of chicken pox    History of peptic ulcer disease 09/25/2015   Hyperlipidemia    Hypertension    Hypothyroidism    Neuropathy of foot    bilateral   Pinched nerve    PONV (postoperative nausea and vomiting)    exploratory surgery at [facility]   Thyroid disease    Tuberculosis exposure    Mother had TB.  Pt shows (+) on tests, but has never had.   Vertigo    last episode over 1 yr ago   Wears dentures    partial upper and lower    Past Surgical History:  Procedure Laterality Date   ABDOMINAL HYSTERECTOMY  07/13/2013   Total. with BSO for postmenopausal bleeding and fibroids with large cervical polyp. Dr. Levy Sjogren and Dr. Glennon Mac at [place]  04/17/2011   Hepatomegaly with borderline slenomegaly. Suggestive of fatty liver. s/p cholecystectomy. Portions  of aorta obscured   BREAST SURGERY Right 2011   BREAST BIOPSY   CESAREAN SECTION  1988   CHOLECYSTECTOMY  1990   COLONOSCOPY WITH PROPOFOL N/A 09/01/2017   Procedure: COLONOSCOPY WITH PROPOFOL;  Surgeon: [name], MD;  Location: [place];  Service: Gastroenterology;  Laterality: N/A;   DIAGNOSTIC LAPAROSCOPY     ESOPHAGOGASTRODUODENOSCOPY (EGD) WITH PROPOFOL N/A 01/17/2020   Procedure: ESOPHAGOGASTRODUODENOSCOPY (EGD) WITH PROPOFOL;  Surgeon: [name], MD;  Location: [facility];  Service: Endoscopy;  Laterality: N/A;   EXPLORATORY LAPAROTOMY     Left breast   MASTECTOMY     MASTECTOMY W/ SENTINEL NODE BIOPSY Bilateral 10/28/2015   Procedure: MASTECTOMY WITH SENTINEL LYMPH NODE BIOPSY;  Surgeon: Hubbard Robinson, MD;  Location: [facility];  Service: General;  Laterality: Bilateral;   PORT-A-CATH REMOVAL Left 10/11/2016   Procedure: REMOVAL PORT-A-CATH;  Surgeon: Hubbard Robinson, MD;  Location: [facility];  Service: General;  Laterality: Left;   PORTACATH PLACEMENT Left 12/23/2015   Procedure: INSERTION PORT-A-CATH;  Surgeon: Hubbard Robinson, MD;  Location: [facility];  Service: General;  Laterality: Left;   WRIST SURGERY      Family History  Problem Relation Age of Onset   Hypertension Mother    Thyroid disease Mother    Hypertension Brother    Diabetes Brother    Breast cancer Neg Hx    Ovarian  cancer Neg Hx    Colon cancer Neg Hx     Social History   Tobacco Use   Smoking status: Never   Smokeless tobacco: Never  Vaping Use   Vaping Use: Never used  Substance Use Topics   Alcohol use: No    Alcohol/week: 0.0 standard drinks   Drug use: No    No current facility-administered medications for this encounter.  Current Outpatient Medications:    atenolol (TENORMIN) 25 MG tablet, Take 1 tablet (25 mg total) by mouth in the morning. Take in addition to 50mg  in the morning., Disp: 90 tablet, Rfl: 1   atenolol (TENORMIN) 50 MG tablet, TAKE 1 TABLET  DAILY, Disp: 90 tablet, Rfl: 1   B Complex-C (SUPER B COMPLEX PO), Take 1 tablet by mouth daily., Disp: , Rfl:    CALCIUM CITRATE PO, Take 1 tablet by mouth daily., Disp: , Rfl:    Carboxymeth-Glycerin-Polysorb (REFRESH OPTIVE MEGA-3 OP), Place 1 drop into both eyes daily., Disp: , Rfl:    docusate sodium (COLACE) 100 MG capsule, Take 200 mg by mouth daily. , Disp: , Rfl:    ELDERBERRY PO, Take by mouth daily. , Disp: , Rfl:    fexofenadine (ALLEGRA) 180 MG tablet, Take 180 mg by mouth daily., Disp: , Rfl:    gabapentin (NEURONTIN) 300 MG capsule, Take 1 capsule (300 mg total) by mouth 2 (two) times daily., Disp: 60 capsule, Rfl: 3   hydrochlorothiazide (HYDRODIURIL) 25 MG tablet, TAKE 1 TABLET BY MOUTH EVERY DAY, Disp: 90 tablet, Rfl: 3   ibuprofen (ADVIL,MOTRIN) 200 MG tablet, Take 400 mg by mouth every 6 (six) hours as needed for mild pain., Disp: , Rfl:    ipratropium (ATROVENT) 0.06 % nasal spray, Place 2 sprays into both nostrils 4 (four) times daily., Disp: 15 mL, Rfl: 12   ketoconazole (NIZORAL) 2 % cream, Apply 1 application topically daily., Disp: 50 g, Rfl: 3   lansoprazole (PREVACID) 15 MG capsule, Take by mouth., Disp: , Rfl:    lansoprazole (PREVACID) 30 MG capsule, TAKE 1 CAPSULE DAILY AT 12 NOON (NEED OFFICE VISIT PRIOR TO FURTHER REFILLS) (Patient taking differently: daily as needed.), Disp: 90 capsule, Rfl: 0   letrozole (FEMARA) 2.5 MG tablet, Take 2.5 mg by mouth daily., Disp: , Rfl:    levothyroxine (SYNTHROID) 75 MCG tablet, TAKE 1 TABLET DAILY, Disp: 90 tablet, Rfl: 3   lidocaine (XYLOCAINE) 2 % solution, , Disp: , Rfl:    magic mouthwash SOLN, 1 part 2% viscous lidocaine: 1 part Maalox: 1 part diphenhydramine 12.5 mg per 5 ml elixir. Rinse and spit 5 ml every six hour as needed, Disp: 60 mL, Rfl: 3   Magnesium 400 MG TABS, Take 600 mg by mouth daily. , Disp: , Rfl:    meclizine (ANTIVERT) 12.5 MG tablet, TAKE 1 TABLET(12.5 MG) BY MOUTH THREE TIMES DAILY AS NEEDED FOR  DIZZINESS, Disp: 90 tablet, Rfl: 3   Misc Natural Products (TURMERIC CURCUMIN) CAPS, Take 1 capsule by mouth daily., Disp: , Rfl:    nitrofurantoin, macrocrystal-monohydrate, (MACROBID) 100 MG capsule, Take 1 capsule (100 mg total) by mouth 2 (two) times daily for 5 days., Disp: 10 capsule, Rfl: 0   nystatin (MYCOSTATIN/NYSTOP) powder, Apply 1 application topically 3 (three) times daily., Disp: 15 g, Rfl: 0   phenazopyridine (PYRIDIUM) 200 MG tablet, Take 1 tablet (200 mg total) by mouth 3 (three) times daily as needed for pain., Disp: 6 tablet, Rfl: 0   promethazine-dextromethorphan (PROMETHAZINE-DM) 6.25-15 MG/5ML  syrup, Take 5 mLs by mouth 4 (four) times daily as needed., Disp: 118 mL, Rfl: 0   sodium chloride (OCEAN) 0.65 % SOLN nasal spray, Place 1 spray into both nostrils as needed for congestion., Disp: 104 mL, Rfl: 0   triamcinolone cream (KENALOG) 0.5 %, Apply 1 application topically 2 (two) times daily as needed. For itching, Disp: 30 g, Rfl: 0   Vitamin D, Ergocalciferol, (DRISDOL) 1.25 MG (50000 UNIT) CAPS capsule, TAKE 1 CAPSULE BY MOUTH 1 TIME A WEEK, Disp: 12 capsule, Rfl: 1   vitamin E 180 MG (400 UNITS) capsule, Take by mouth., Disp: , Rfl:   Allergies  Allergen Reactions   Penicillins Shortness Of Breath    Has patient had a PCN reaction causing immediate rash, facial/tongue/throat swelling, SOB or lightheadedness with hypotension: Yes Has patient had a PCN reaction causing severe rash involving mucus membranes or skin necrosis: No Has patient had a PCN reaction that required hospitalization No Has patient had a PCN reaction occurring within the last 10 years: No If all of the above answers are "NO", then may proceed with Cephalosporin use.    Nickel Hives   Keflex [Cephalexin] Swelling    Throat swelling   Pravastatin Sodium Other (See Comments)    Myalgia      ROS  As noted in HPI.   Physical Exam  BP (!) 165/60 (BP Location: Left Arm)   Pulse 70   Temp 98.6  F (37 C) (Oral)   Resp 18   SpO2 100%   Constitutional: Well developed, well nourished, no acute distress Eyes:  EOMI, conjunctiva normal bilaterally HENT: Normocephalic, atraumatic,mucus membranes moist Respiratory: Normal inspiratory effort Cardiovascular: Normal rate GI: nondistended soft, nontender.  No suprapubic, flank tenderness. Back: No CVAT skin: No rash, skin intact Musculoskeletal: no deformities Neurologic: Alert & oriented x 3, no focal neuro deficits Psychiatric: Speech and behavior appropriate   ED Course   Medications - No data to display  Orders Placed This Encounter  Procedures   Urine Culture    Standing Status:   Standing    Number of Occurrences:   1    Order Specific Question:   Indication    Answer:   Urgency/frequency    No results found for this or any previous visit (from the past 24 hour(s)). No results found.  ED Clinical Impression  1. Urinary tract infection with hematuria, site unspecified      ED Assessment/Plan  Presentation consistent with a UTI.  Will send home with Macrobid because she states this worked well for her in the past and Pyridium.  She is allergic to cephalosporins.  She has no history of kidney disease.  Urine dip not crossing over.  Positive for trace blood and small leukocytes.  Sending urine off for culture to confirm diagnosis and antibiotic choice.  Follow-up with PMD as needed.  ER return precautions given.   Discussed MDM, treatment plan, and plan for follow-up with patient. Discussed sn/sx that should prompt return to the ED. patient agrees with plan.   Meds ordered this encounter  Medications   nitrofurantoin, macrocrystal-monohydrate, (MACROBID) 100 MG capsule    Sig: Take 1 capsule (100 mg total) by mouth 2 (two) times daily for 5 days.    Dispense:  10 capsule    Refill:  0   phenazopyridine (PYRIDIUM) 200 MG tablet    Sig: Take 1 tablet (200 mg total) by mouth 3 (three) times daily as needed for  pain.  Dispense:  6 tablet    Refill:  0      *This clinic note was created using [employer]. Therefore, there may be occasional mistakes despite careful proofreading.  ?    Melynda Ripple, MD 09/05/21 1701

## 2021-09-05 NOTE — ED Triage Notes (Signed)
Pt having UTI SX for 3 days, frequency, burning.

## 2021-09-07 LAB — URINE CULTURE

## 2021-09-11 ENCOUNTER — Other Ambulatory Visit: Payer: Self-pay

## 2021-09-11 ENCOUNTER — Ambulatory Visit
Admission: EM | Admit: 2021-09-11 | Discharge: 2021-09-11 | Disposition: A | Discharge status: Home / Self Care | Payer: Medicare Other | Attending: Emergency Medicine | Admitting: Emergency Medicine

## 2021-09-11 ENCOUNTER — Encounter: Payer: Self-pay | Admitting: Emergency Medicine

## 2021-09-11 DIAGNOSIS — R3989 Other symptoms and signs involving the genitourinary system: Secondary | ICD-10-CM | POA: Diagnosis not present

## 2021-09-11 LAB — URINALYSIS, COMPLETE (UACMP) WITH MICROSCOPIC
Bacteria, UA: NONE SEEN
Bilirubin Urine: NEGATIVE
Glucose, UA: NEGATIVE mg/dL
Hgb urine dipstick: NEGATIVE
Ketones, ur: NEGATIVE mg/dL
Nitrite: NEGATIVE
Protein, ur: NEGATIVE mg/dL
Specific Gravity, Urine: 1.02 (ref 1.005–1.030)
pH: 7 (ref 5.0–8.0)

## 2021-09-11 MED ORDER — PHENAZOPYRIDINE HCL 200 MG PO TABS: 200.0000 mg | ORAL_TABLET | Freq: Three times a day (TID) | ORAL | 0 refills | Status: DC

## 2021-09-11 NOTE — ED Triage Notes (Signed)
Patient was treated for a UTI 5 days ago.  Patient states that she finished her Macrobid.  Patient states that the burning and urgency has improved but that she still has some bladder pressure.  Patient states that her urine culture was contaminated.

## 2021-09-11 NOTE — ED Provider Notes (Signed)
[facility]    CSN: [id number] Arrival date & time: 09/11/21  0802      History   Chief Complaint Chief Complaint  Patient presents with   Abdominal Pain    HPI Kaylee Reyes is a 70 y.o. female.   HPI  70 year old female here for evaluation of urinary complaints.  Patient was evaluated in this urgent care 6 days ago for UTI symptoms and was treated with a 5-day course of Macrobid.  She reports that the Macrobid has resolved the burning and urinary urgency and frequency.  She still reports some bladder pressure.  She does have low back pain but is not sure that is associated to UTI symptoms as she was having back pain before the UTI symptoms developed.  The urine was cultured but it grew out mixed urogenital flora and recollection was recommended.  Patient denies any blood in her urine, cloudiness to the urine, fever, or vaginal complaints.  No itching or discharge.  Past Medical History:  Diagnosis Date   Anemia    Body tinea 10/01/2015   Previously treated by Dr. Koleen Nimrod    GERD (gastroesophageal reflux disease)    History of breast cancer 09/2015   breast cancer   History of chicken pox    History of peptic ulcer disease 09/25/2015   Hyperlipidemia    Hypertension    Hypothyroidism    Neuropathy of foot    bilateral   Pinched nerve    PONV (postoperative nausea and vomiting)    exploratory surgery at [facility]   Thyroid disease    Tuberculosis exposure    Mother had TB.  Pt shows (+) on tests, but has never had.   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   COVID-19 02/23/2021   Morbid obesity ([place]) 08/06/2019   Menopause 03/23/2017   Rectocele 03/23/2017   Cystocele, midline 03/23/2017   Vaginal atrophy 03/23/2017   Left breast cancer with T3 tumor, >5 cm in greatest dimension ([place])    S/P  bilateral mastectomy 10/28/2015   Fatty infiltration of liver 10/01/2015   Gonalgia 10/01/2015   Invasive lobular carcinoma of right breast, stage 2 ([place]) 10/01/2015   Hypothyroidism 09/25/2015   Adult BMI 35.0-35.9 kg/sq m 09/25/2015   Hypercholesterolemia 09/25/2015   Hypertension 09/25/2015   Allergic rhinitis 09/25/2015   GERD (gastroesophageal reflux disease) 09/25/2015   Alopecia 09/25/2015   Eczema 09/25/2015    Past Surgical History:  Procedure Laterality Date   ABDOMINAL HYSTERECTOMY  07/13/2013   Total. with BSO for postmenopausal bleeding and fibroids with large cervical polyp. Dr. Levy Sjogren and Dr. Glennon Mac at [place]  04/17/2011   Hepatomegaly with borderline slenomegaly. Suggestive of fatty liver. s/p cholecystectomy. Portions of aorta obscured   BREAST SURGERY Right 2011   BREAST BIOPSY   CESAREAN SECTION  1988   CHOLECYSTECTOMY  1990   COLONOSCOPY WITH PROPOFOL N/A 09/01/2017   Procedure: COLONOSCOPY WITH PROPOFOL;  Surgeon: [name], MD;  Location: [place];  Service: Gastroenterology;  Laterality: N/A;   DIAGNOSTIC LAPAROSCOPY     ESOPHAGOGASTRODUODENOSCOPY (EGD) WITH PROPOFOL N/A 01/17/2020   Procedure: ESOPHAGOGASTRODUODENOSCOPY (EGD) WITH PROPOFOL;  Surgeon: [name], MD;  Location: [facility];  Service: Endoscopy;  Laterality: N/A;   EXPLORATORY LAPAROTOMY     Left breast  MASTECTOMY     MASTECTOMY W/ SENTINEL NODE BIOPSY Bilateral 10/28/2015   Procedure: MASTECTOMY WITH SENTINEL LYMPH NODE BIOPSY;  Surgeon: Hubbard Robinson, MD;  Location: [facility];  Service: General;  Laterality: Bilateral;   PORT-A-CATH REMOVAL Left 10/11/2016   Procedure: REMOVAL PORT-A-CATH;  Surgeon: Hubbard Robinson, MD;  Location: [facility];  Service: General;  Laterality: Left;   PORTACATH PLACEMENT Left 12/23/2015   Procedure: INSERTION PORT-A-CATH;  Surgeon: Hubbard Robinson, MD;  Location: [facility];  Service: General;  Laterality: Left;    WRIST SURGERY      OB History     Gravida  3   Para  2   Term  2   Preterm      AB  1   Living  2      SAB  1   IAB      Ectopic      Multiple      Live Births  2            Home Medications    Prior to Admission medications   Medication Sig Start Date End Date Taking? Authorizing Provider  phenazopyridine (PYRIDIUM) 200 MG tablet Take 1 tablet (200 mg total) by mouth 3 (three) times daily. 09/11/21  Yes Margarette Canada, NP  atenolol (TENORMIN) 25 MG tablet Take 1 tablet (25 mg total) by mouth in the morning. Take in addition to 50mg  in the morning. 09/05/21   Birdie Sons, MD  atenolol (TENORMIN) 50 MG tablet TAKE 1 TABLET DAILY 08/25/21   Birdie Sons, MD  B Complex-C (SUPER B COMPLEX PO) Take 1 tablet by mouth daily.    [provider]  CALCIUM CITRATE PO Take 1 tablet by mouth daily.    [provider]  Carboxymeth-Glycerin-Polysorb (REFRESH OPTIVE MEGA-3 OP) Place 1 drop into both eyes daily.    [provider]  docusate sodium (COLACE) 100 MG capsule Take 200 mg by mouth daily.     [provider]  ELDERBERRY PO Take by mouth daily.     [provider]  fexofenadine (ALLEGRA) 180 MG tablet Take 180 mg by mouth daily.    [provider]  gabapentin (NEURONTIN) 300 MG capsule Take 1 capsule (300 mg total) by mouth 2 (two) times daily. 09/01/21   Ventura Sellers, MD  hydrochlorothiazide (HYDRODIURIL) 25 MG tablet TAKE 1 TABLET BY MOUTH EVERY DAY 06/15/21   Birdie Sons, MD  ibuprofen (ADVIL,MOTRIN) 200 MG tablet Take 400 mg by mouth every 6 (six) hours as needed for mild pain.    [provider]  ipratropium (ATROVENT) 0.06 % nasal spray Place 2 sprays into both nostrils 4 (four) times daily. 04/20/21   Margarette Canada, NP  ketoconazole (NIZORAL) 2 % cream Apply 1 application topically daily. 09/22/20   Mar Daring, PA-C  lansoprazole (PREVACID) 15 MG capsule Take by mouth.    [provider]  lansoprazole (PREVACID) 30 MG capsule TAKE 1 CAPSULE DAILY AT 12 NOON (NEED OFFICE VISIT PRIOR TO FURTHER REFILLS) Patient taking differently: daily as needed. 07/27/21   Birdie Sons, MD  letrozole [facility]) 2.5 MG tablet Take 2.5 mg by mouth daily.    [provider]  levothyroxine (SYNTHROID) 75 MCG tablet TAKE 1 TABLET DAILY 02/09/21   Birdie Sons, MD  lidocaine (XYLOCAINE) 2 % solution  04/18/21   [provider]  magic mouthwash SOLN 1 part 2% viscous lidocaine: 1 part Maalox: 1  part diphenhydramine 12.5 mg per 5 ml elixir. Rinse and spit 5 ml every six hour as needed 05/12/21   Birdie Sons, MD  Magnesium 400 MG TABS Take 600 mg by mouth daily.     [provider]  meclizine (ANTIVERT) 12.5 MG tablet TAKE 1 TABLET(12.5 MG) BY MOUTH THREE TIMES DAILY AS NEEDED FOR DIZZINESS 08/20/21   Birdie Sons, MD  Misc Natural Products (TURMERIC CURCUMIN) CAPS Take 1 capsule by mouth daily.    [provider]  nystatin (MYCOSTATIN/NYSTOP) powder Apply 1 application topically 3 (three) times daily. 09/22/20   Mar Daring, PA-C  sodium chloride (OCEAN) 0.65 % SOLN nasal spray Place 1 spray into both nostrils as needed for congestion. 12/04/20   Flinchum, Kelby Aline, FNP  triamcinolone cream (KENALOG) 0.5 % Apply 1 application topically 2 (two) times daily as needed. For itching 06/15/21   Birdie Sons, MD  Vitamin D, Ergocalciferol, (DRISDOL) 1.25 MG (50000 UNIT) CAPS capsule TAKE 1 CAPSULE BY MOUTH 1 TIME A WEEK 06/06/21   Lloyd Huger, MD  vitamin E 180 MG (400 UNITS) capsule Take by mouth.    [provider]  fluticasone (FLONASE) 50 MCG/ACT nasal spray Place 2 sprays into both nostrils daily. 01/21/21 01/21/21  Rodriguez-Southworth, Sunday Spillers, PA-C    Family History Family History  Problem Relation Age of Onset   Hypertension Mother    Thyroid disease Mother    Hypertension Brother    Diabetes Brother    Breast  cancer Neg Hx    Ovarian cancer Neg Hx    Colon cancer Neg Hx     Social History Social History   Tobacco Use   Smoking status: Never   Smokeless tobacco: Never  Vaping Use   Vaping Use: Never used  Substance Use Topics   Alcohol use: No    Alcohol/week: 0.0 standard drinks   Drug use: No     Allergies   Penicillins, Nickel, Keflex [cephalexin], and Pravastatin sodium   Review of Systems Review of Systems  Constitutional:  Negative for fever.  Genitourinary:  Negative for dysuria, frequency, hematuria, urgency, vaginal discharge and vaginal pain.  Musculoskeletal:  Positive for back pain.  Hematological: Negative.   Psychiatric/Behavioral: Negative.      Physical Exam Triage Vital Signs ED Triage Vitals  Enc Vitals Group     BP 09/11/21 0822 (!) 151/67     Pulse Rate 09/11/21 0822 65     Resp 09/11/21 0822 14     Temp 09/11/21 0822 98.4 F (36.9 C)     Temp Source 09/11/21 0822 Oral     SpO2 09/11/21 0822 99 %     Weight 09/11/21 0819 279 lb 8.7 oz (126.8 kg)     Height 09/11/21 0819 5\' 6"  (1.676 m)     Head Circumference --      Peak Flow --      Pain Score 09/11/21 0819 2     Pain Loc --      Pain Edu? --      Excl. in [place]? --    No data found.  Updated Vital Signs BP (!) 151/67 (BP Location: Left Arm)   Pulse 65   Temp 98.4 F (36.9 C) (Oral)   Resp 14   Ht 5\' 6"  (1.676 m)   Wt 279 lb 8.7 oz (126.8 kg)   SpO2 99%   BMI 45.12 kg/m   Visual Acuity Right Eye Distance:   Left Eye Distance:  Bilateral Distance:    Right Eye Near:   Left Eye Near:    Bilateral Near:     Physical Exam Vitals and nursing note reviewed.  Constitutional:      General: She is not in acute distress.    Appearance: Normal appearance. She is obese. She is not ill-appearing.  HENT:     Head: Normocephalic and atraumatic.  Cardiovascular:     Rate and Rhythm: Normal rate and regular rhythm.     Pulses: Normal pulses.     Heart sounds: Normal heart sounds. No  murmur heard.   No gallop.  Pulmonary:     Effort: Pulmonary effort is normal.     Breath sounds: Normal breath sounds. No wheezing, rhonchi or rales.  Abdominal:     Palpations: Abdomen is soft.     Tenderness: There is no abdominal tenderness. There is no right CVA tenderness, left CVA tenderness, guarding or rebound.  Skin:    General: Skin is warm and dry.     Capillary Refill: Capillary refill takes less than 2 seconds.     Findings: No erythema or rash.  Neurological:     General: No focal deficit present.     Mental Status: She is alert and oriented to person, place, and time.  Psychiatric:        Mood and Affect: Mood normal.        Behavior: Behavior normal.        Thought Content: Thought content normal.        Judgment: Judgment normal.     UC Treatments / Results  Labs (all labs ordered are listed, but only abnormal results are displayed) Labs Reviewed  URINALYSIS, COMPLETE (UACMP) WITH MICROSCOPIC - Abnormal; Notable for the following components:      Result Value   Leukocytes,Ua TRACE (*)    All other components within normal limits  URINE CULTURE    EKG   Radiology No results found.  Procedures Procedures (including critical care time)  Medications Ordered in UC Medications - No data to display  Initial Impression / Assessment and Plan / UC Course  I have reviewed the triage vital signs and the nursing notes.  Pertinent labs & imaging results that were available during my care of the patient were reviewed by me and considered in my medical decision making (see chart for details).  Patient is a very pleasant, nontoxic-appearing 70 year old female here for evaluation of continued bladder pressure without dysuria, urinary urgency or frequency.  No hematuria or cloudiness to the urine.  Patient does have low back pain across the middle of her lumbar region.  This predates her urinary symptom onset.  Patient physical exam reveals a benign cardiopulmonary  exam with clear lung sounds in all fields.  Abdomen is soft and nontender.  No CVA tenderness on exam.  Urinalysis was collected at triage.  Will evaluate the urinalysis for the presence of infection and if necessary send it for reculture.  If there is no sign of infection we will order a wet prep to ensure there is no vaginal component to the discomfort.  If all tests are negative we will have patient follow-up with her GYN for evaluation of her pelvic structures for possible pelvic floor dysfunction.  Patient does have baseline bladder leakage.  Urinalysis shows trace leukocyte Estrace and is otherwise unremarkable.  There were no bacteria seen on microscopy.  We will send urine for culture chest to be sure.  We will treat patient  for her bladder pressure with phenazopyridine while the culture is pending.  I will also crits patient to follow-up with her GYN for evaluation of possible pelvic floor dysfunction and a possible referral for pelvic floor PT given the fact that she has bladder leakage at baseline.   Final Clinical Impressions(s) / UC Diagnoses   Final diagnoses:  Sensation of pressure in bladder area     Discharge Instructions      Your urinalysis showed trace leukocyte esterase today but no bacteria was seen on microscopy.  We will send the urine for culture to ensure there is no infection present.  In the meantime, use the Pyridium every 8 hours as needed for urinary discomfort or bladder pressure.  Increase your fluid intake so that you increase urine production and flush her urinary system.  With having bladder leakage you may have some pelvic floor dysfunction and I recommend following up with your GYN for further evaluation and possible referral to pelvic floor physical therapy.  If you develop new or worsening symptoms please return for reevaluation.     ED Prescriptions     Medication Sig Dispense Auth. Provider   phenazopyridine (PYRIDIUM) 200 MG tablet Take 1  tablet (200 mg total) by mouth 3 (three) times daily. 6 tablet Margarette Canada, NP      PDMP not reviewed this encounter.   Margarette Canada, NP 09/11/21 [phone]

## 2021-09-11 NOTE — Discharge Instructions (Addendum)
Your urinalysis showed trace leukocyte esterase today but no bacteria was seen on microscopy.  We will send the urine for culture to ensure there is no infection present.  In the meantime, use the Pyridium every 8 hours as needed for urinary discomfort or bladder pressure.  Increase your fluid intake so that you increase urine production and flush her urinary system.  With having bladder leakage you may have some pelvic floor dysfunction and I recommend following up with your GYN for further evaluation and possible referral to pelvic floor physical therapy.  If you develop new or worsening symptoms please return for reevaluation.

## 2021-09-13 LAB — URINE CULTURE

## 2021-09-15 ENCOUNTER — Encounter: Payer: Self-pay | Admitting: Family Medicine

## 2021-09-15 ENCOUNTER — Ambulatory Visit (INDEPENDENT_AMBULATORY_CARE_PROVIDER_SITE_OTHER): Payer: Medicare Other | Admitting: Family Medicine

## 2021-09-15 ENCOUNTER — Other Ambulatory Visit: Payer: Self-pay

## 2021-09-15 VITALS — BP 130/72 | HR 69 | Temp 98.1°F | Ht 66.0 in | Wt 286.2 lb

## 2021-09-15 DIAGNOSIS — K760 Fatty (change of) liver, not elsewhere classified: Secondary | ICD-10-CM

## 2021-09-15 DIAGNOSIS — E7800 Pure hypercholesterolemia, unspecified: Secondary | ICD-10-CM | POA: Diagnosis not present

## 2021-09-15 DIAGNOSIS — Z0000 Encounter for general adult medical examination without abnormal findings: Secondary | ICD-10-CM

## 2021-09-15 DIAGNOSIS — G620 Drug-induced polyneuropathy: Secondary | ICD-10-CM | POA: Diagnosis not present

## 2021-09-15 DIAGNOSIS — N390 Urinary tract infection, site not specified: Secondary | ICD-10-CM | POA: Diagnosis not present

## 2021-09-15 DIAGNOSIS — E039 Hypothyroidism, unspecified: Secondary | ICD-10-CM | POA: Diagnosis not present

## 2021-09-15 DIAGNOSIS — E559 Vitamin D deficiency, unspecified: Secondary | ICD-10-CM

## 2021-09-15 DIAGNOSIS — I10 Essential (primary) hypertension: Secondary | ICD-10-CM

## 2021-09-15 DIAGNOSIS — T451X5A Adverse effect of antineoplastic and immunosuppressive drugs, initial encounter: Secondary | ICD-10-CM

## 2021-09-15 NOTE — Patient Instructions (Addendum)
Please review the attached list of medications and notify my office if there are any errors.   I recommend that you get the Prevnar-20 vaccine to protect yourself from certain strains of pneumonia. Please call our office at [phone] at your earliest convenience to schedule this vaccine.    I strongly recommend a Covid bivalent (Omicron) booster if it's been more than 2 months since your last covid booster or infection.    It is recommended to engage in 150 minutes of moderate exercise every week.

## 2021-09-15 NOTE — Progress Notes (Signed)
Annual Wellness Visit     Patient: Kaylee Reyes, Female    DOB: 15-Jun-1951, 70 y.o.   MRN: [id number] Visit Date: 09/15/2021  Today's Provider: Lelon Huh, MD   No chief complaint on file.  Subjective    Kaylee Reyes is a 70 y.o. female who presents today for her Annual Wellness Visit. She reports consuming a general diet.  Walking 2-3xs weekly for at least 30 minutes.  She generally feels well. She reports sleeping well. She does not have additional problems to discuss today.   HPI -GYN visited 03/26/2021 -Does not want to receive flu vaccine. -Patient reports still having urgency to urinate. Was seen at ED on 09/05/21 and 09/11/21. Has completed antibiotics and still having symptoms.   Hypertension, follow-up  BP Readings from Last 3 Encounters:  09/11/21 (!) 151/67  09/05/21 (!) 165/60  08/14/21 (!) 179/80   Wt Readings from Last 3 Encounters:  09/11/21 279 lb 8.7 oz (126.8 kg)  08/14/21 279 lb 9.6 oz (126.8 kg)  08/03/21 269 lb (122 kg)     She was last seen for hypertension 3 months ago.  BP at that visit was 164/75. Management since that visit includes adding an additional 10m of atenolol in the evening in addition to morning  dose of 559m  She reports poor compliance with treatment. (Switched by cardiologist to 25 mg in the morning with her fluid pills and 50 mg in the evenings) She is not having side effects.  She is following a Regular diet. She is exercising. She does not smoke.  Use of agents associated with hypertension: thyroid hormones.   Outside blood pressures are 150/64 or 160/58. Somewhere in that range Symptoms: No chest pain No chest pressure  No palpitations No syncope  No dyspnea No orthopnea  No paroxysmal nocturnal dyspnea No lower extremity edema   Pertinent labs: Lab Results  Component Value Date   NA 137 02/27/2021   K 3.6 02/27/2021   CREATININE 0.81 02/27/2021   EGFR 83 02/23/2021   GFRNONAA >60 02/27/2021    GLUCOSE 130 (H) 02/27/2021     The 10-year ASCVD risk score (Arnett DK, et al., 2019) is: 17.9%   ---------------------------------------------------------------------------------------------------   Hypothyroid, follow-up  Lab Results  Component Value Date   TSH 2.950 02/23/2021   TSH 0.925 07/15/2020   TSH 1.840 11/20/2019   T4TOTAL 7.4 04/04/2017   Wt Readings from Last 3 Encounters:  09/11/21 279 lb 8.7 oz (126.8 kg)  08/14/21 279 lb 9.6 oz (126.8 kg)  08/03/21 269 lb (122 kg)    She was last seen for hypothyroid 6 months ago.  Management since that visit includes continue same medications. She reports excellent compliance with treatment. She is not having side effects.   Symptoms: No change in energy level No constipation  No diarrhea No heat / cold intolerance  No nervousness No palpitations  No weight changes    -----------------------------------------------------------------------------------------   Lipid/Cholesterol, Follow-up  Last lipid panel Other pertinent labs  Lab Results  Component Value Date   CHOL 247 (H) 07/15/2020   HDL 39 (L) 07/15/2020   LDLCALC 179 (H) 07/15/2020   TRIG 154 (H) 07/15/2020   CHOLHDL 6.2 (H) 11/20/2019   Lab Results  Component Value Date   ALT 66 (H) 02/27/2021   AST 73 (H) 02/27/2021   PLT 164 02/27/2021   TSH 2.950 02/23/2021     She was last seen for this 1  year  ago.  Management since that visit includes continue lifestyle modifications and low fate diet.  She reports excellent compliance with treatment. She is not having side effects.   Symptoms: No chest pain No chest pressure/discomfort  No dyspnea No lower extremity edema  No numbness or tingling of extremity No orthopnea  No palpitations No paroxysmal nocturnal dyspnea  No speech difficulty No syncope   Current diet: in general, a "healthy" diet   Current exercise: walking  The 10-year ASCVD risk score (Arnett DK, et al., 2019) is:  17.9%  ---------------------------------------------------------------------------------------------------    Medications: Outpatient Medications Prior to Visit  Medication Sig   atenolol (TENORMIN) 25 MG tablet Take 1 tablet (25 mg total) by mouth in the morning. Take in addition to 1m in the morning.   atenolol (TENORMIN) 50 MG tablet TAKE 1 TABLET DAILY   B Complex-C (SUPER B COMPLEX PO) Take 1 tablet by mouth daily.   CALCIUM CITRATE PO Take 1 tablet by mouth daily.   Carboxymeth-Glycerin-Polysorb (REFRESH OPTIVE MEGA-3 OP) Place 1 drop into both eyes daily.   docusate sodium (COLACE) 100 MG capsule Take 200 mg by mouth daily.    ELDERBERRY PO Take by mouth daily.    fexofenadine (ALLEGRA) 180 MG tablet Take 180 mg by mouth daily.   gabapentin (NEURONTIN) 300 MG capsule Take 1 capsule (300 mg total) by mouth 2 (two) times daily.   hydrochlorothiazide (HYDRODIURIL) 25 MG tablet TAKE 1 TABLET BY MOUTH EVERY DAY   ibuprofen (ADVIL,MOTRIN) 200 MG tablet Take 400 mg by mouth every 6 (six) hours as needed for mild pain.   ipratropium (ATROVENT) 0.06 % nasal spray Place 2 sprays into both nostrils 4 (four) times daily.   ketoconazole (NIZORAL) 2 % cream Apply 1 application topically daily.   lansoprazole (PREVACID) 15 MG capsule Take by mouth.   lansoprazole (PREVACID) 30 MG capsule TAKE 1 CAPSULE DAILY AT 12 NOON (NEED OFFICE VISIT PRIOR TO FURTHER REFILLS) (Patient taking differently: daily as needed.)   letrozole (FEMARA) 2.5 MG tablet Take 2.5 mg by mouth daily.   levothyroxine (SYNTHROID) 75 MCG tablet TAKE 1 TABLET DAILY   lidocaine (XYLOCAINE) 2 % solution    magic mouthwash SOLN 1 part 2% viscous lidocaine: 1 part Maalox: 1 part diphenhydramine 12.5 mg per 5 ml elixir. Rinse and spit 5 ml every six hour as needed   Magnesium 400 MG TABS Take 600 mg by mouth daily.    meclizine (ANTIVERT) 12.5 MG tablet TAKE 1 TABLET(12.5 MG) BY MOUTH THREE TIMES DAILY AS NEEDED FOR DIZZINESS    Misc Natural Products (TURMERIC CURCUMIN) CAPS Take 1 capsule by mouth daily.   nystatin (MYCOSTATIN/NYSTOP) powder Apply 1 application topically 3 (three) times daily.   phenazopyridine (PYRIDIUM) 200 MG tablet Take 1 tablet (200 mg total) by mouth 3 (three) times daily.   sodium chloride (OCEAN) 0.65 % SOLN nasal spray Place 1 spray into both nostrils as needed for congestion.   triamcinolone cream (KENALOG) 0.5 % Apply 1 application topically 2 (two) times daily as needed. For itching   Vitamin D, Ergocalciferol, (DRISDOL) 1.25 MG (50000 UNIT) CAPS capsule TAKE 1 CAPSULE BY MOUTH 1 TIME A WEEK   vitamin E 180 MG (400 UNITS) capsule Take by mouth.   No facility-administered medications prior to visit.    Allergies  Allergen Reactions   Penicillins Shortness Of Breath    Has patient had a PCN reaction causing immediate rash, facial/tongue/throat swelling, SOB or lightheadedness with hypotension: Yes Has patient had a  PCN reaction causing severe rash involving mucus membranes or skin necrosis: No Has patient had a PCN reaction that required hospitalization No Has patient had a PCN reaction occurring within the last 10 years: No If all of the above answers are "NO", then may proceed with Cephalosporin use.    Nickel Hives   Keflex [Cephalexin] Swelling    Throat swelling   Pravastatin Sodium Other (See Comments)    Myalgia     Patient Care Team: Birdie Sons, MD as PCP - General (Family Medicine) [name], MD as Consulting Physician (Gastroenterology) Diamond Nickel, DO as Consulting Physician (Sports Medicine) Sharlet Salina, MD as Referring Physician (Physical Medicine and Rehabilitation) Olean Ree, MD as Consulting Physician (General Surgery) Lloyd Huger, MD as Consulting Physician (Oncology) Anell Barr, OD (Optometry) Rubie Maid, MD as Referring Physician (Obstetrics and Gynecology)  Review of Systems  Respiratory:  Positive for shortness of  breath.   Cardiovascular:  Positive for leg swelling.  Gastrointestinal:  Positive for constipation.  Genitourinary:  Positive for urgency.  Musculoskeletal:  Positive for arthralgias, back pain and neck stiffness.  All other systems reviewed and are negative.      Objective    Vitals: BP 130/72 (BP Location: Left Wrist, Patient Position: Sitting, Cuff Size: Large)   Pulse 69   Temp 98.1 F (36.7 C) (Oral)   Ht 5' 6" (1.676 m)   Wt 286 lb 3.2 oz (129.8 kg)   SpO2 99%   BMI 46.19 kg/m    Physical Exam  General: Appearance:    Severely obese female in no acute distress  Eyes:    PERRL, conjunctiva/corneas clear, EOM's intact       Lungs:     Clear to auscultation bilaterally, respirations unlabored  Heart:    Normal heart rate. Normal rhythm. No murmurs, rubs, or gallops.    MS:   All extremities are intact.    Neurologic:   Awake, alert, oriented x 3. No apparent focal neurological defect.         Most recent functional status assessment: In your present state of health, do you have any difficulty performing the following activities: 06/15/2021  Hearing? N  Vision? N  Difficulty concentrating or making decisions? N  Walking or climbing stairs? N  Dressing or bathing? N  Doing errands, shopping? N  Some recent data might be hidden   Most recent fall risk assessment: Fall Risk  06/15/2021  Falls in the past year? 0  Comment -  Number falls in past yr: 0  Injury with Fall? 0  Risk for fall due to : No Fall Risks  Follow up Falls evaluation completed    Most recent depression screenings: PHQ 2/9 Scores 06/15/2021 01/15/2021  PHQ - 2 Score 0 0  PHQ- 9 Score 0 1   Most recent cognitive screening: No flowsheet data found. Most recent Audit-C alcohol use screening Alcohol Use Disorder Test (AUDIT) 06/15/2021  1. How often do you have a drink containing alcohol? 0  2. How many drinks containing alcohol do you have on a typical day when you are drinking? 0  3. How often  do you have six or more drinks on one occasion? 0  AUDIT-C Score 0  Alcohol Brief Interventions/Follow-up -   A score of 3 or more in women, and 4 or more in men indicates increased risk for alcohol abuse, EXCEPT if all of the points are from question 1   No results found for  any visits on 09/15/21.  Assessment & Plan     Annual wellness visit done today including the all of the following: Reviewed patient's Family Medical History Reviewed and updated list of patient's medical providers Assessment of cognitive impairment was done Assessed patient's functional ability Established a written schedule for health screening [place] Completed and Reviewed  Exercise Activities and Dietary recommendations  Goals      DIET - INCREASE WATER INTAKE     Recommend to drink at least 6-8 8oz glasses of water per day.     Prevent falls     Recommend to remove any items from the home that may cause slips or trips.        Immunization History  Administered Date(s) Administered   Influenza Split 09/11/2010, 08/25/2011, 08/28/2013   Tdap 08/25/2011    Health Maintenance  Topic Date Due   COVID-19 Vaccine (1) Never done   Zoster Vaccines- Shingrix (1 of 2) Never done   INFLUENZA VACCINE  07/06/2021   TETANUS/TDAP  08/24/2021   COLONOSCOPY (Pts 45-75yr Insurance coverage will need to be confirmed)  09/02/2027   DEXA SCAN  Completed   Hepatitis C Screening  Completed   HPV VACCINES  Aged Out   MAMMOGRAM  Discontinued     Discussed health benefits of physical activity, and encouraged her to engage in regular exercise appropriate for her age and condition.    1. Primary hypertension Fairly well controlled. Continue current medications.    2. Hypothyroidism, unspecified type  - TSH  3. Chemotherapy-induced neuropathy (HCC) Stable on current medications.   4. Hypercholesterolemia Intolerant to pravastatin and refuses trial of other statins. Is doing well  with diet.  - CBC - Comprehensive metabolic panel - Lipid panel  5. Morbid obesity ([place] Encourage regular exercise and prudent diet.   6. Vitamin D deficiency  - VITAMIN D 25 Hydroxy (Vit-D Deficiency, Fractures)  7. Urinary tract infection without hematuria, site unspecified Symptoms improved, but not resolved since recently finishing course of nitrofurantoin.  - Urine Culture  8. Fatty liver  CBC and met C as above.   She declined flu vaccine      The entirety of the information documented in the History of Present Illness, Review of Systems and Physical Exam were personally obtained by me. Portions of this information were initially documented by the CMA and reviewed by me for thoroughness and accuracy.     DLelon Huh MD  [phone](phone) [phone](fax)  [place]

## 2021-09-16 DIAGNOSIS — H16223 Keratoconjunctivitis sicca, not specified as Sjogren's, bilateral: Secondary | ICD-10-CM | POA: Diagnosis not present

## 2021-09-16 DIAGNOSIS — H2513 Age-related nuclear cataract, bilateral: Secondary | ICD-10-CM | POA: Diagnosis not present

## 2021-09-17 DIAGNOSIS — E559 Vitamin D deficiency, unspecified: Secondary | ICD-10-CM | POA: Diagnosis not present

## 2021-09-17 DIAGNOSIS — E7800 Pure hypercholesterolemia, unspecified: Secondary | ICD-10-CM | POA: Diagnosis not present

## 2021-09-17 DIAGNOSIS — N390 Urinary tract infection, site not specified: Secondary | ICD-10-CM | POA: Diagnosis not present

## 2021-09-17 DIAGNOSIS — E039 Hypothyroidism, unspecified: Secondary | ICD-10-CM | POA: Diagnosis not present

## 2021-09-18 LAB — COMPREHENSIVE METABOLIC PANEL
ALT: 16 IU/L (ref 0–32)
AST: 20 IU/L (ref 0–40)
Albumin/Globulin Ratio: 1.4 (ref 1.2–2.2)
Albumin: 4.2 g/dL (ref 3.8–4.8)
Alkaline Phosphatase: 112 IU/L (ref 44–121)
BUN/Creatinine Ratio: 12 (ref 12–28)
BUN: 11 mg/dL (ref 8–27)
Bilirubin Total: 0.4 mg/dL (ref 0.0–1.2)
CO2: 25 mmol/L (ref 20–29)
Calcium: 9.6 mg/dL (ref 8.7–10.3)
Chloride: 99 mmol/L (ref 96–106)
Creatinine, Ser: 0.91 mg/dL (ref 0.57–1.00)
Globulin, Total: 2.9 g/dL (ref 1.5–4.5)
Glucose: 115 mg/dL — ABNORMAL HIGH (ref 70–99)
Potassium: 4.1 mmol/L (ref 3.5–5.2)
Sodium: 139 mmol/L (ref 134–144)
Total Protein: 7.1 g/dL (ref 6.0–8.5)
eGFR: 68 mL/min/{1.73_m2} (ref >59)

## 2021-09-18 LAB — LIPID PANEL
Chol/HDL Ratio: 6.5 ratio — ABNORMAL HIGH (ref 0.0–4.4)
Cholesterol, Total: 266 mg/dL — ABNORMAL HIGH (ref 100–199)
HDL: 41 mg/dL (ref >39)
LDL Chol Calc (NIH): 178 mg/dL — ABNORMAL HIGH (ref 0–99)
Triglycerides: 247 mg/dL — ABNORMAL HIGH (ref 0–149)
VLDL Cholesterol Cal: 47 mg/dL — ABNORMAL HIGH (ref 5–40)

## 2021-09-18 LAB — CBC
Hematocrit: 36 % (ref 34.0–46.6)
Hemoglobin: 11.4 g/dL (ref 11.1–15.9)
MCH: 28.2 pg (ref 26.6–33.0)
MCHC: 31.7 g/dL (ref 31.5–35.7)
MCV: 89 fL (ref 79–97)
Platelets: 210 10*3/uL (ref 150–450)
RBC: 4.04 x10E6/uL (ref 3.77–5.28)
RDW: 14 % (ref 11.7–15.4)
WBC: 7.8 10*3/uL (ref 3.4–10.8)

## 2021-09-18 LAB — VITAMIN D 25 HYDROXY (VIT D DEFICIENCY, FRACTURES): Vit D, 25-Hydroxy: 39.1 ng/mL (ref 30.0–100.0)

## 2021-09-18 LAB — TSH: TSH: 2.62 u[IU]/mL (ref 0.450–4.500)

## 2021-09-21 ENCOUNTER — Telehealth: Payer: Self-pay

## 2021-09-21 NOTE — Telephone Encounter (Signed)
-----   Message from Birdie Sons, MD sent at 09/18/2021  8:17 AM EDT ----- Cholesterol is very high at 266, putting her at high risk for strokes and heart problems. Recommend trial of welchol 625mg  3 tablets twice a day #180 rf x3.  Also recommend taking 81mg  aspirin once a day. Please schedule follow up 3-4 months.

## 2021-09-21 NOTE — Telephone Encounter (Signed)
I called and advised patient of results. Patient declined starting Welchol. She states she has not been watching her diet the way she should have been, and would like to try 3 months of lifestyle and dietary modifications before starting any medications. Patient agrees to start taking a daily baby Asprin 81mg .

## 2021-09-25 ENCOUNTER — Inpatient Hospital Stay: Payer: Medicare Other | Attending: Internal Medicine | Admitting: Internal Medicine

## 2021-09-25 DIAGNOSIS — G620 Drug-induced polyneuropathy: Secondary | ICD-10-CM

## 2021-09-25 DIAGNOSIS — T451X5A Adverse effect of antineoplastic and immunosuppressive drugs, initial encounter: Secondary | ICD-10-CM

## 2021-09-25 NOTE — Progress Notes (Signed)
I connected with Kaylee Reyes on 09/25/21 at 11:30 AM EDT by telephone visit and verified that I am speaking with the correct person using two identifiers.  I discussed the limitations, risks, security and privacy concerns of performing an evaluation and management service by telemedicine and the availability of in-person appointments. I also discussed with the patient that there may be a patient responsible charge related to this service. The patient expressed understanding and agreed to proceed.  Other persons participating in the visit and their role in the encounter:  na  Patient's location:  Home  Provider's location:  Office  Chief Complaint:  Chemotherapy-induced neuropathy ([place])  History of Present Ilness: Kaylee Reyes describes minimal improvement with the gabapentin, she self discontinued it.  She does report some improvement in her back of leg and lower back pain since our prior visit. Observations: Language and cognition at baseline Assessment and Plan: Chemotherapy-induced neuropathy ([place])  Patient prefers to manage neuropathy symptoms without medication, lifestyle modifications were discussed.  For sciatica, if symptoms persist or worsen we can repeat MRI Lumbar Spine which was previously done in 2019.  Follow Up Instructions: RTC as needed  I discussed the assessment and treatment plan with the patient.  The patient was provided an opportunity to ask questions and all were answered.  The patient agreed with the plan and demonstrated understanding of the instructions.    The patient was advised to call back or seek an in-person evaluation if the symptoms worsen or if the condition fails to improve as anticipated.  I provided 5-10 minutes of non-face-to-face time during this enocunter.  Ventura Sellers, MD   I provided 15 minutes of non face-to-face telephone visit time during this encounter, and > 50% was spent counseling as documented under my assessment & plan.

## 2021-09-27 LAB — URINE CULTURE

## 2021-09-30 ENCOUNTER — Ambulatory Visit
Admission: EM | Admit: 2021-09-30 | Discharge: 2021-09-30 | Disposition: A | Discharge status: Home / Self Care | Payer: Medicare Other | Attending: Emergency Medicine | Admitting: Emergency Medicine

## 2021-09-30 ENCOUNTER — Other Ambulatory Visit: Payer: Self-pay

## 2021-09-30 DIAGNOSIS — J069 Acute upper respiratory infection, unspecified: Secondary | ICD-10-CM | POA: Diagnosis not present

## 2021-09-30 MED ORDER — PROMETHAZINE-DM 6.25-15 MG/5ML PO SYRP: 5.0000 mL | ORAL_SOLUTION | Freq: Four times a day (QID) | ORAL | 0 refills | Status: DC | PRN

## 2021-09-30 MED ORDER — BENZONATATE 100 MG PO CAPS: 200.0000 mg | ORAL_CAPSULE | Freq: Three times a day (TID) | ORAL | 0 refills | Status: DC

## 2021-09-30 MED ORDER — DOXYCYCLINE HYCLATE 100 MG PO CAPS: 100.0000 mg | ORAL_CAPSULE | Freq: Two times a day (BID) | ORAL | 0 refills | Status: DC

## 2021-09-30 MED ORDER — IPRATROPIUM BROMIDE 0.06 % NA SOLN: 2.0000 | Freq: Four times a day (QID) | NASAL | 12 refills | Status: DC

## 2021-09-30 NOTE — ED Triage Notes (Signed)
Pt reports having a dry cough, and nasal congestion x12 days.

## 2021-09-30 NOTE — ED Provider Notes (Signed)
[facility]    CSN: [id number] Arrival date & time: 09/30/21  8786      History   Chief Complaint Chief Complaint  Patient presents with   Cough    HPI Kaylee Reyes is a 70 y.o. female.   HPI  70 year old female here for evaluation of respiratory complaints.  Patient reports that for the last 12 days she has been experiencing nasal congestion with pressure under her left eye, bilateral ear pressure, and a scratchy sore throat.  She is also been experiencing a cough that is intermittently productive when she takes an expectorant.  The sputum was initially green and now it has changed to a thick white.  This has not been associated with fever, shortness of breath, or wheezing.  No GI complaints.  She has been using Robitussin-DM and Coricidin without any relief of symptoms.  Past Medical History:  Diagnosis Date   Anemia    Body tinea 10/01/2015   Previously treated by Dr. Koleen Nimrod    COVID-19 02/23/2021   MAB infusion 01/23/2021   GERD (gastroesophageal reflux disease)    History of breast cancer 09/2015   breast cancer   History of chicken pox    History of peptic ulcer disease 09/25/2015   Hyperlipidemia    Hypertension    Hypothyroidism    Neuropathy of foot    bilateral   Pinched nerve    PONV (postoperative nausea and vomiting)    exploratory surgery at [facility]   Thyroid disease    Tuberculosis exposure    Mother had TB.  Pt shows (+) on tests, but has never had.   Vertigo    last episode over 1 yr ago   Wears dentures    partial upper and lower    Patient Active Problem List   Diagnosis Date Noted   Chemotherapy-induced neuropathy ([place]) 08/14/2021   Morbid obesity ([place]) 08/06/2019   Menopause 03/23/2017   Rectocele 03/23/2017   Cystocele, midline 03/23/2017   Vaginal atrophy 03/23/2017   Left breast cancer with T3 tumor, >5 cm in greatest dimension ([place])    S/P bilateral mastectomy 10/28/2015   Fatty infiltration of liver 10/01/2015    Gonalgia 10/01/2015   Invasive lobular carcinoma of right breast, stage 2 ([place]) 10/01/2015   Hypothyroidism 09/25/2015   Hypercholesterolemia 09/25/2015   Hypertension 09/25/2015   Allergic rhinitis 09/25/2015   GERD (gastroesophageal reflux disease) 09/25/2015   Alopecia 09/25/2015   Eczema 09/25/2015    Past Surgical History:  Procedure Laterality Date   ABDOMINAL HYSTERECTOMY  07/13/2013   Total. with BSO for postmenopausal bleeding and fibroids with large cervical polyp. Dr. Levy Sjogren and Dr. Glennon Mac at [facility]  04/17/2011   Hepatomegaly with borderline slenomegaly. Suggestive of fatty liver. s/p cholecystectomy. Portions of aorta obscured   BREAST SURGERY Right 2011   BREAST BIOPSY   CESAREAN SECTION  1988   CHOLECYSTECTOMY  1990   COLONOSCOPY WITH PROPOFOL N/A 09/01/2017   Procedure: COLONOSCOPY WITH PROPOFOL;  Surgeon: [name], MD;  Location: [facility];  Service: Gastroenterology;  Laterality: N/A;   DIAGNOSTIC LAPAROSCOPY     ESOPHAGOGASTRODUODENOSCOPY (EGD) WITH PROPOFOL N/A 01/17/2020   Procedure: ESOPHAGOGASTRODUODENOSCOPY (EGD) WITH PROPOFOL;  Surgeon: [name], MD;  Location: [facility];  Service: Endoscopy;  Laterality: N/A;   EXPLORATORY LAPAROTOMY     Left breast   MASTECTOMY     MASTECTOMY W/ SENTINEL NODE BIOPSY Bilateral 10/28/2015   Procedure: MASTECTOMY WITH SENTINEL LYMPH NODE BIOPSY;  Surgeon: Hubbard Robinson, MD;  Location: [facility];  Service: General;  Laterality: Bilateral;   PORT-A-CATH REMOVAL Left 10/11/2016   Procedure: REMOVAL PORT-A-CATH;  Surgeon: Hubbard Robinson, MD;  Location: [facility];  Service: General;  Laterality: Left;   PORTACATH PLACEMENT Left 12/23/2015   Procedure: INSERTION PORT-A-CATH;  Surgeon: Hubbard Robinson, MD;  Location: [facility];  Service: General;  Laterality: Left;   WRIST SURGERY      OB History     Gravida  3   Para  2   Term  2   Preterm      AB  1   Living  2       SAB  1   IAB      Ectopic      Multiple      Live Births  2            Home Medications    Prior to Admission medications   Medication Sig Start Date End Date Taking? Authorizing Provider  benzonatate (TESSALON) 100 MG capsule Take 2 capsules (200 mg total) by mouth every 8 (eight) hours. 09/30/21  Yes Margarette Canada, NP  doxycycline (VIBRAMYCIN) 100 MG capsule Take 1 capsule (100 mg total) by mouth 2 (two) times daily. 09/30/21  Yes Margarette Canada, NP  ipratropium (ATROVENT) 0.06 % nasal spray Place 2 sprays into both nostrils 4 (four) times daily. 09/30/21  Yes Margarette Canada, NP  promethazine-dextromethorphan (PROMETHAZINE-DM) 6.25-15 MG/5ML syrup Take 5 mLs by mouth 4 (four) times daily as needed. 09/30/21  Yes Margarette Canada, NP  atenolol (TENORMIN) 25 MG tablet Take 1 tablet (25 mg total) by mouth in the morning. Take in addition to $RemoveBef'50mg'fmVMuAPjhP$  in the morning. 09/05/21   Birdie Sons, MD  atenolol (TENORMIN) 50 MG tablet TAKE 1 TABLET DAILY 08/25/21   Birdie Sons, MD  B Complex-C (SUPER B COMPLEX PO) Take 1 tablet by mouth daily.    [provider]  CALCIUM CITRATE PO Take 1 tablet by mouth daily.    [provider]  Carboxymeth-Glycerin-Polysorb (REFRESH OPTIVE MEGA-3 OP) Place 1 drop into both eyes daily.    [provider]  docusate sodium (COLACE) 100 MG capsule Take 200 mg by mouth daily.     [provider]  ELDERBERRY PO Take by mouth daily.     [provider]  fexofenadine (ALLEGRA) 180 MG tablet Take 180 mg by mouth daily.    [provider]  hydrochlorothiazide (HYDRODIURIL) 25 MG tablet TAKE 1 TABLET BY MOUTH EVERY DAY 06/15/21   Birdie Sons, MD  ibuprofen (ADVIL,MOTRIN) 200 MG tablet Take 400 mg by mouth every 6 (six) hours as needed for mild pain.    [provider]  ketoconazole (NIZORAL) 2 % cream Apply 1 application topically daily. 09/22/20   Mar Daring, PA-C  lansoprazole (PREVACID)  15 MG capsule Take by mouth.    [provider]  lansoprazole (PREVACID) 30 MG capsule TAKE 1 CAPSULE DAILY AT 12 NOON (NEED OFFICE VISIT PRIOR TO FURTHER REFILLS) Patient taking differently: daily as needed. 07/27/21   Birdie Sons, MD  letrozole [facility]) 2.5 MG tablet Take 2.5 mg by mouth daily.    [provider]  levothyroxine (SYNTHROID) 75 MCG tablet TAKE 1 TABLET DAILY 02/09/21   Birdie Sons, MD  lidocaine (XYLOCAINE) 2 % solution  04/18/21   [provider]  magic mouthwash SOLN 1 part 2% viscous lidocaine: 1 part Maalox: 1  part diphenhydramine 12.5 mg per 5 ml elixir. Rinse and spit 5 ml every six hour as needed 05/12/21   Birdie Sons, MD  Magnesium 400 MG TABS Take 600 mg by mouth daily.     [provider]  meclizine (ANTIVERT) 12.5 MG tablet TAKE 1 TABLET(12.5 MG) BY MOUTH THREE TIMES DAILY AS NEEDED FOR DIZZINESS 08/20/21   Birdie Sons, MD  Misc Natural Products (TURMERIC CURCUMIN) CAPS Take 1 capsule by mouth daily.    [provider]  nystatin (MYCOSTATIN/NYSTOP) powder Apply 1 application topically 3 (three) times daily. 09/22/20   Mar Daring, PA-C  phenazopyridine (PYRIDIUM) 200 MG tablet Take 1 tablet (200 mg total) by mouth 3 (three) times daily. 09/11/21   Margarette Canada, NP  sodium chloride (OCEAN) 0.65 % SOLN nasal spray Place 1 spray into both nostrils as needed for congestion. 12/04/20   Flinchum, Kelby Aline, FNP  triamcinolone cream (KENALOG) 0.5 % Apply 1 application topically 2 (two) times daily as needed. For itching 06/15/21   Birdie Sons, MD  Vitamin D, Ergocalciferol, (DRISDOL) 1.25 MG (50000 UNIT) CAPS capsule TAKE 1 CAPSULE BY MOUTH 1 TIME A WEEK 06/06/21   Lloyd Huger, MD  vitamin E 180 MG (400 UNITS) capsule Take by mouth.    [provider]  fluticasone (FLONASE) 50 MCG/ACT nasal spray Place 2 sprays into both nostrils daily. 01/21/21 01/21/21  Rodriguez-Southworth, Sunday Spillers, PA-C     Family History Family History  Problem Relation Age of Onset   Hypertension Mother    Thyroid disease Mother    Hypertension Brother    Diabetes Brother    Breast cancer Neg Hx    Ovarian cancer Neg Hx    Colon cancer Neg Hx     Social History Social History   Tobacco Use   Smoking status: Never   Smokeless tobacco: Never  Vaping Use   Vaping Use: Never used  Substance Use Topics   Alcohol use: No    Alcohol/week: 0.0 standard drinks   Drug use: No     Allergies   Penicillins, Nickel, Keflex [cephalexin], and Pravastatin sodium   Review of Systems Review of Systems  Constitutional:  Negative for activity change, appetite change and fever.  HENT:  Positive for congestion, ear pain, rhinorrhea, sinus pressure and sore throat.   Respiratory:  Positive for cough. Negative for shortness of breath and wheezing.   Gastrointestinal:  Negative for diarrhea, nausea and vomiting.  Skin:  Negative for rash.  Hematological: Negative.   Psychiatric/Behavioral: Negative.      Physical Exam Triage Vital Signs ED Triage Vitals  Enc Vitals Group     BP 09/30/21 0946 (!) 144/80     Pulse Rate 09/30/21 0946 76     Resp 09/30/21 0946 18     Temp 09/30/21 0946 98.6 F (37 C)     Temp Source 09/30/21 0946 Oral     SpO2 09/30/21 0946 96 %     Weight 09/30/21 0947 274 lb (124.3 kg)     Height 09/30/21 0947 $RemoveBefor'5\' 6"'wNicvLcZwPok$  (1.676 m)     Head Circumference --      Peak Flow --      Pain Score 09/30/21 0946 0     Pain Loc --      Pain Edu? --      Excl. in [place]? --    No data found.  Updated Vital Signs BP (!) 144/80   Pulse 76   Temp  98.6 F (37 C) (Oral)   Resp 18   Ht $R'5\' 6"'qw$  (1.676 m)   Wt 274 lb (124.3 kg)   SpO2 96%   BMI 44.22 kg/m   Visual Acuity Right Eye Distance:   Left Eye Distance:   Bilateral Distance:    Right Eye Near:   Left Eye Near:    Bilateral Near:     Physical Exam Vitals and nursing note reviewed.  Constitutional:      General: She is not in  acute distress.    Appearance: Normal appearance. She is obese. She is not ill-appearing.  HENT:     Head: Normocephalic and atraumatic.     Right Ear: Tympanic membrane, ear canal and external ear normal. There is no impacted cerumen.     Left Ear: Tympanic membrane, ear canal and external ear normal. There is no impacted cerumen.     Nose: Congestion and rhinorrhea present.     Mouth/Throat:     Mouth: Mucous membranes are moist.     Pharynx: Oropharynx is clear. Posterior oropharyngeal erythema present.  Cardiovascular:     Rate and Rhythm: Normal rate and regular rhythm.     Pulses: Normal pulses.     Heart sounds: Normal heart sounds. No murmur heard.   No gallop.  Pulmonary:     Effort: Pulmonary effort is normal.     Breath sounds: Normal breath sounds. No wheezing, rhonchi or rales.  Musculoskeletal:     Cervical back: Normal range of motion and neck supple.  Lymphadenopathy:     Cervical: No cervical adenopathy.  Skin:    General: Skin is warm and dry.     Capillary Refill: Capillary refill takes less than 2 seconds.     Findings: No erythema or rash.  Neurological:     General: No focal deficit present.     Mental Status: She is alert and oriented to person, place, and time.  Psychiatric:        Mood and Affect: Mood normal.        Behavior: Behavior normal.        Thought Content: Thought content normal.        Judgment: Judgment normal.     UC Treatments / Results  Labs (all labs ordered are listed, but only abnormal results are displayed) Labs Reviewed - No data to display  EKG   Radiology No results found.  Procedures Procedures (including critical care time)  Medications Ordered in UC Medications - No data to display  Initial Impression / Assessment and Plan / UC Course  I have reviewed the triage vital signs and the nursing notes.  Pertinent labs & imaging results that were available during my care of the patient were reviewed by me and  considered in my medical decision making (see chart for details).  Patient is a very pleasant, nontoxic-appearing 70 year old female here for evaluation of respiratory complaints as outlined in HPI above.  Patient's physical exam reveals pearly gray tympanic membranes bilaterally with a normal light reflex and clear external auditory canals.  Nasal mucosa is erythematous and edematous with purulent nasal discharge in both nares.  Patient does have tenderness to percussion of bilateral maxillary sinuses.  Oropharyngeal exam reveals posterior oropharyngeal erythema with yellow postnasal drip.  No cervical lymphadenopathy appreciated on exam.  Cardiopulmonary exam reveals clear lung sounds in all fields.  Patient exam is consistent with an upper respiratory infection.  Due to the protracted duration of patient's symptoms we will  do a trial of antibiotics.  Patient is requesting azithromycin and I explained to her that azithromycin is no longer indicated for treatment of upper respiratory infections.  She has an allergy to penicillin and cephalosporins so I will treat her with doxycycline twice daily for 10 days.  I have encouraged the patient to take a probiotic while she is on the antibiotic as well as perform sinus irrigation to help relieve the mucus burden from her nasal passages and upper respiratory tract.  I have also prescribed Atrovent nasal spray to help her with her nasal congestion, Tessalon Perles to help with cough during the day, and Promethazine DM cough syrup for use at bedtime.  Return precautions reviewed with patient.   Final Clinical Impressions(s) / UC Diagnoses   Final diagnoses:  Upper respiratory tract infection, unspecified type     Discharge Instructions      The Doxycycline twice daily with food for 10 days for treatment of your URI.  Perform sinus irrigation 2-3 times a day with a NeilMed sinus rinse kit and distilled water.  Do not use tap water.  You can use plain  over-the-counter Mucinex every 6 hours to break up the stickiness of the mucus so your body can clear it.  Increase your oral fluid intake to thin out your mucus so that is also able for your body to clear more easily.  Take an over-the-counter probiotic, such as Culturelle-align-activia, 1 hour after each dose of antibiotic to prevent diarrhea.  Use the Atrovent nasal spray, 2 squirts in each nostril every 6 hours, as needed for runny nose and postnasal drip.  Use the Tessalon Perles every 8 hours during the day.  Take them with a small sip of water.  They may give you some numbness to the base of your tongue or a metallic taste in your mouth, this is normal.  Use the Promethazine DM cough syrup at bedtime for cough and congestion.  It will make you drowsy so do not take it during the day.  If you develop any new or worsening symptoms return for reevaluation or see your primary care provider.      ED Prescriptions     Medication Sig Dispense Auth. Provider   doxycycline (VIBRAMYCIN) 100 MG capsule Take 1 capsule (100 mg total) by mouth 2 (two) times daily. 20 capsule Margarette Canada, NP   ipratropium (ATROVENT) 0.06 % nasal spray Place 2 sprays into both nostrils 4 (four) times daily. 15 mL Margarette Canada, NP   benzonatate (TESSALON) 100 MG capsule Take 2 capsules (200 mg total) by mouth every 8 (eight) hours. 21 capsule Margarette Canada, NP   promethazine-dextromethorphan (PROMETHAZINE-DM) 6.25-15 MG/5ML syrup Take 5 mLs by mouth 4 (four) times daily as needed. 118 mL Margarette Canada, NP      PDMP not reviewed this encounter.   Margarette Canada, NP 09/30/21 1024

## 2021-09-30 NOTE — Discharge Instructions (Signed)
The Doxycycline twice daily with food for 10 days for treatment of your URI.  Perform sinus irrigation 2-3 times a day with a NeilMed sinus rinse kit and distilled water.  Do not use tap water.  You can use plain over-the-counter Mucinex every 6 hours to break up the stickiness of the mucus so your body can clear it.  Increase your oral fluid intake to thin out your mucus so that is also able for your body to clear more easily.  Take an over-the-counter probiotic, such as Culturelle-align-activia, 1 hour after each dose of antibiotic to prevent diarrhea.  Use the Atrovent nasal spray, 2 squirts in each nostril every 6 hours, as needed for runny nose and postnasal drip.  Use the Tessalon Perles every 8 hours during the day.  Take them with a small sip of water.  They may give you some numbness to the base of your tongue or a metallic taste in your mouth, this is normal.  Use the Promethazine DM cough syrup at bedtime for cough and congestion.  It will make you drowsy so do not take it during the day.  If you develop any new or worsening symptoms return for reevaluation or see your primary care provider.  

## 2021-10-01 ENCOUNTER — Telehealth: Payer: Self-pay

## 2021-10-01 ENCOUNTER — Other Ambulatory Visit: Payer: Self-pay

## 2021-10-01 MED ORDER — AZITHROMYCIN 250 MG PO TABS: ORAL_TABLET | ORAL | 0 refills | Status: AC

## 2021-10-01 NOTE — Telephone Encounter (Signed)
Patient request approved by Dr Rosanna Randy and  Rx has been sent in   Copied from [place] [phone] [phone]. Topic: General - Other >> Oct 01, 2021  8:58 AM Leward Quan A wrote: Reason for CRM: Patient called in to inform Dr Caryn Section that she went to the Urgent Care on 10/01/21 due to having a cold for the past 12 days. Per patient she was given doxycycline (VIBRAMYCIN) 100 MG capsule, per patient after reading the side effects she would prefer not to take that medication and asking if Dr Caryn Section can please send an Rx to her pharmacy for a Z pack which she have used in the past and it does not interfere with her abdominal issues which she is already being treated for. Can be reached at  Ph# [phone]

## 2021-10-06 ENCOUNTER — Other Ambulatory Visit: Payer: Self-pay | Admitting: Family Medicine

## 2021-10-06 MED ORDER — LANSOPRAZOLE 30 MG PO CPDR: DELAYED_RELEASE_CAPSULE | ORAL | 0 refills | Status: DC

## 2021-10-06 NOTE — Telephone Encounter (Signed)
Copied from [place] [id number]. Topic: Quick Communication - Rx Refill/Question >> Oct 06, 2021  4:59 PM Yvette Rack wrote: Medication: lansoprazole (PREVACID) 30 MG capsule  Has the patient contacted their pharmacy? Yes.  Pt told to contact provider (Agent: If no, request that the patient contact the pharmacy for the refill. If patient does not wish to contact the pharmacy document the reason why and proceed with request.) (Agent: If yes, when and what did the pharmacy advise?)  Preferred Pharmacy (with phone number or street name): [facility] [place] Phone: [phone]  Fax: [phone]  Has the patient been seen for an appointment in the last year OR does the patient have an upcoming appointment? Yes.    Agent: Please be advised that RX refills may take up to 3 business days. We ask that you follow-up with your pharmacy.

## 2021-10-16 DIAGNOSIS — L918 Other hypertrophic disorders of the skin: Secondary | ICD-10-CM | POA: Diagnosis not present

## 2021-10-16 DIAGNOSIS — D485 Neoplasm of uncertain behavior of skin: Secondary | ICD-10-CM | POA: Diagnosis not present

## 2021-10-16 DIAGNOSIS — L821 Other seborrheic keratosis: Secondary | ICD-10-CM | POA: Diagnosis not present

## 2021-10-16 DIAGNOSIS — D225 Melanocytic nevi of trunk: Secondary | ICD-10-CM | POA: Diagnosis not present

## 2021-10-16 DIAGNOSIS — D171 Benign lipomatous neoplasm of skin and subcutaneous tissue of trunk: Secondary | ICD-10-CM | POA: Diagnosis not present

## 2021-10-16 DIAGNOSIS — L578 Other skin changes due to chronic exposure to nonionizing radiation: Secondary | ICD-10-CM | POA: Diagnosis not present

## 2021-10-22 ENCOUNTER — Telehealth: Payer: Self-pay | Admitting: Family Medicine

## 2021-10-22 ENCOUNTER — Ambulatory Visit: Payer: Self-pay | Admitting: *Deleted

## 2021-10-22 NOTE — Telephone Encounter (Signed)
I returned pt's call.   She had called in requesting a refill for the [employer] she was given for a URI at the urgent care.  (Pt seen at [facility] [place] and diagnosed with URI.   Was given Ladona Ridgel during that visit on 09/30/2021).   I'm feeling better except for this cough from the sinus drainage down the back of my throat.    The Tessalon Perles didn't stop the cough but they helped.  I've purchase a nasal wash unit that I'm going to try and see if that helps with the sinus drainage down my throat.  I let her know I would send her request to Dr. Caryn Section for a refill.   Pt was agreeable and thanked me for my help.  Notes sent to [facility].

## 2021-10-22 NOTE — Telephone Encounter (Signed)
Reason for Disposition  Cough with cold symptoms (e.g., runny nose, postnasal drip, throat clearing)  Answer Assessment - Initial Assessment Questions 1. ONSET: "When did the cough begin?"      I've had this cough for a while.   I was seen at urgent care.   2. SEVERITY: "How bad is the cough today?"      It flares up on and off 3. SPUTUM: "Describe the color of your sputum" (none, dry cough; clear, white, yellow, green)     Not coughing up anything I'm incontinent too of urine when I cough.  The Tessalon Perles helped a little but I need them refilled.   I bought a nasal wash device yesterday I haven't used it yet.    4. HEMOPTYSIS: "Are you coughing up any blood?" If so ask: "How much?" (flecks, streaks, tablespoons, etc.)     No 5. DIFFICULTY BREATHING: "Are you having difficulty breathing?" If Yes, ask: "How bad is it?" (e.g., mild, moderate, severe)    - MILD: No SOB at rest, mild SOB with walking, speaks normally in sentences, can lie down, no retractions, pulse < 100.    - MODERATE: SOB at rest, SOB with minimal exertion and prefers to sit, cannot lie down flat, speaks in phrases, mild retractions, audible wheezing, pulse 100-120.    - SEVERE: Very SOB at rest, speaks in single words, struggling to breathe, sitting hunched forward, retractions, pulse > 120      My lungs are clear   6. FEVER: "Do you have a fever?" If Yes, ask: "What is your temperature, how was it measured, and when did it start?"     No fever 7. CARDIAC HISTORY: "Do you have any history of heart disease?" (e.g., heart attack, congestive heart failure)      Not asked 8. LUNG HISTORY: "Do you have any history of lung disease?"  (e.g., pulmonary embolus, asthma, emphysema)     I had Covid in Feb.    No lung problems.    I've had breast cancer in the past.  I'm not taking the cough medicine they gave me at urgent care for at night.   I've not needed it.    My sinuses drain down the back of my throat and make me cough.    9. PE RISK FACTORS: "Do you have a history of blood clots?" (or: recent major surgery, recent prolonged travel, bedridden)     Not asked 10. OTHER SYMPTOMS: "Do you have any other symptoms?" (e.g., runny nose, wheezing, chest pain)       My sinuses are draining down the back of my throat.   I think the nasal wash will help.   11. PREGNANCY: "Is there any chance you are pregnant?" "When was your last menstrual period?"       N/A due to age 22. TRAVEL: "Have you traveled out of the country in the last month?" (e.g., travel history, exposures)       Not asked  Protocols used: Cough - Acute Non-Productive-A-AH

## 2021-10-22 NOTE — Telephone Encounter (Signed)
ERROR

## 2021-10-23 MED ORDER — BENZONATATE 100 MG PO CAPS: 200.0000 mg | ORAL_CAPSULE | Freq: Three times a day (TID) | ORAL | 0 refills | Status: DC

## 2021-10-23 NOTE — Addendum Note (Signed)
Addended by: Birdie Sons on: 10/23/2021 01:18 PM   Modules accepted: Orders

## 2021-10-23 NOTE — Telephone Encounter (Signed)
Have sent prescription for tessalon to [facility] in [place].

## 2021-10-23 NOTE — Telephone Encounter (Signed)
Pt advised.   Thanks,   -Dimitriy Carreras  

## 2021-11-18 ENCOUNTER — Other Ambulatory Visit: Payer: Self-pay | Admitting: Family Medicine

## 2021-11-18 DIAGNOSIS — B372 Candidiasis of skin and nail: Secondary | ICD-10-CM

## 2021-11-18 NOTE — Telephone Encounter (Signed)
Last refill: 09/22/2020 qty: 50g, with 3 refills  Last office visit: 09/15/2021 (AWV) No future office visit scheduled

## 2021-11-18 NOTE — Telephone Encounter (Signed)
[place] faxed refill request for the following medications:  ketoconazole (NIZORAL) 2 % cream   Please advise.

## 2021-11-19 MED ORDER — KETOCONAZOLE 2 % EX CREA: 1.0000 "application " | TOPICAL_CREAM | Freq: Every day | CUTANEOUS | 3 refills | Status: DC

## 2021-11-20 ENCOUNTER — Other Ambulatory Visit: Payer: Self-pay | Admitting: Physician Assistant

## 2021-11-20 DIAGNOSIS — B372 Candidiasis of skin and nail: Secondary | ICD-10-CM

## 2021-11-20 NOTE — Telephone Encounter (Signed)
LOV: 09/15/2021  NOV: none  Last Refill: 09/22/2020 #15g 0 Refills.    Thanks,   -Mickel Baas

## 2021-11-21 ENCOUNTER — Other Ambulatory Visit: Payer: Self-pay | Admitting: Oncology

## 2021-11-25 DIAGNOSIS — L308 Other specified dermatitis: Secondary | ICD-10-CM | POA: Diagnosis not present

## 2021-11-25 DIAGNOSIS — B372 Candidiasis of skin and nail: Secondary | ICD-10-CM | POA: Diagnosis not present

## 2021-12-10 ENCOUNTER — Telehealth: Payer: Self-pay

## 2021-12-10 NOTE — Telephone Encounter (Signed)
Copied from [place] [id number]. Topic: General - Other >> Dec 10, 2021  9:53 AM Valere Dross wrote: Reason for CRM: Pt called in stating she is needing a refill for the [facility] that she wears a 10 count, and  A breast prosthesis, st stated she needs it sent to [facility] at fax [phone]. Please advise

## 2021-12-12 ENCOUNTER — Other Ambulatory Visit: Payer: Self-pay | Admitting: Family Medicine

## 2021-12-12 NOTE — Telephone Encounter (Signed)
Requested Prescriptions  Pending Prescriptions Disp Refills   lansoprazole (PREVACID) 30 MG capsule [Pharmacy Med Name: LANSOPRAZOLE DR CAPS 30MG ] 90 capsule 2    Sig: TAKE 1 CAPSULE DAILY AT 12 NOON (NEED OFFICE VISIT PRIOR TO FURTHER REFILLS)     Gastroenterology: Proton Pump Inhibitors Passed - 12/12/2021 12:49 AM      Passed - Valid encounter within last 12 months    Recent Outpatient Visits          2 months ago Primary hypertension   [facility] Birdie Sons, MD   6 months ago Primary hypertension   [facility] Birdie Sons, MD   9 months ago [place], Kirstie Peri, MD   11 months ago [place], [place]   1 year ago Sinusitis, unspecified chronicity, unspecified location   [place], Kelby Aline, FNP

## 2021-12-14 DIAGNOSIS — H5711 Ocular pain, right eye: Secondary | ICD-10-CM | POA: Diagnosis not present

## 2021-12-14 DIAGNOSIS — H16223 Keratoconjunctivitis sicca, not specified as Sjogren's, bilateral: Secondary | ICD-10-CM | POA: Diagnosis not present

## 2021-12-15 ENCOUNTER — Telehealth: Payer: Self-pay

## 2021-12-15 DIAGNOSIS — Z9010 Acquired absence of unspecified breast and nipple: Secondary | ICD-10-CM | POA: Insufficient documentation

## 2021-12-15 NOTE — Telephone Encounter (Signed)
That's fine, can send order for Bras (10 count), and  A breast prosthesis and fax to [facility] at fax [phone]. Dx s/p mastectomy Z90.10. I'm not sure exactly how it needs to ordered. Can double check with [facility].

## 2021-12-15 NOTE — Telephone Encounter (Signed)
Copied from [place] [phone]. Topic: General - Other >> Dec 15, 2021  8:48 AM Kaylee Reyes A wrote: Reason for CRM: The patient has called requesting to speak with a member of staff when possible  The patient is calling about a previously requested prescription for bras as well as a breast prosthesis requested to be sent to [place]   Please contact the patient further

## 2021-12-16 NOTE — Telephone Encounter (Signed)
I called [facility] and was advised that we need to send a signed order from the provider along with patient demographic sheet. I was also advised that insurance will cover up to 6 bras per year and a breast prosthesis every other year. The bra order needs to be written as "Kaylee Reyes with 6 refills" per [facility]. Order written and placed on Dr. Maralyn Sago desk for review and signature. Fax number to [facility] is [phone].

## 2021-12-24 ENCOUNTER — Telehealth: Payer: Self-pay | Admitting: Family Medicine

## 2021-12-24 DIAGNOSIS — E7800 Pure hypercholesterolemia, unspecified: Secondary | ICD-10-CM

## 2021-12-24 NOTE — Telephone Encounter (Signed)
Please advise it is time to recheck cholesterol. Does not need to be fasting. Please print order and leave for her.

## 2021-12-25 NOTE — Telephone Encounter (Signed)
FYI....  Pt states she is not going to get it checked at this time.  She states he hasn't been following a diet and has gained some weight.     Thanks,   -Mickel Baas

## 2022-01-04 DIAGNOSIS — H16223 Keratoconjunctivitis sicca, not specified as Sjogren's, bilateral: Secondary | ICD-10-CM | POA: Diagnosis not present

## 2022-01-07 ENCOUNTER — Other Ambulatory Visit: Payer: Self-pay | Admitting: Family Medicine

## 2022-02-03 ENCOUNTER — Other Ambulatory Visit: Payer: Self-pay | Admitting: Family Medicine

## 2022-02-15 DIAGNOSIS — H16223 Keratoconjunctivitis sicca, not specified as Sjogren's, bilateral: Secondary | ICD-10-CM | POA: Diagnosis not present

## 2022-02-16 ENCOUNTER — Other Ambulatory Visit: Payer: Self-pay | Admitting: Oncology

## 2022-02-16 NOTE — Telephone Encounter (Signed)
Component Ref Range & Units 5 mo ago  ?Vit D, 25-Hydroxy 30.0 - 100.0 ng/mL 39.1   ?Comment: Vitamin D deficiency has been defined by the Institute of  ?Medicine and an Endocrine Society practice guideline as a  ?level of serum 25-OH vitamin D less than 20 ng/mL (1,2).  ?The Endocrine Society went on to further define vitamin D  ?insufficiency as a level between 21 and 29 ng/mL (2).  ?1. IOM (Institute of Medicine). 2010. Dietary reference  ?   intakes for calcium and D. [place]: The  ?   [employer].  ?2. Holick MF, Binkley [place], Bischoff-Ferrari HA, et al.  ?   Evaluation, treatment, and prevention of vitamin D  ?   deficiency: an Endocrine Society clinical practice  ?   guideline. JCEM. 2011 Jul; 96(7):1911-30.   ?Resulting Agency  [facility]  ?  ? ?  ?Narrative ?Performed by: Maryan Puls ?Performed at:  [facility]  ?[address], [place], [place]  [id number]  ?Lab Director: Rush Farmer MD, Phone:  [phone]  ?  ?Specimen Collected: 09/17/21 08:26 Last Resulted: 09/18/21 05:36  ?  ?  ? ?

## 2022-02-22 ENCOUNTER — Other Ambulatory Visit: Payer: Self-pay | Admitting: Family Medicine

## 2022-03-08 ENCOUNTER — Telehealth: Payer: Self-pay

## 2022-03-08 NOTE — Telephone Encounter (Signed)
Copied from [place] [id number]. Topic: General - Inquiry ?>> Mar 08, 2022  8:56 AM Greggory Keen D wrote: ?Reason for CRM: Pt called needing a handicap paper to renew her tags ?CB#  [phone] ?

## 2022-03-08 NOTE — Telephone Encounter (Signed)
Patient is requesting that Dr. Caryn Section complete an application for a handicap parking place card. She states during her last visit she was told that she could just call and Dr. Caryn Section would complete this form when she needed a renewal.  ?

## 2022-03-09 ENCOUNTER — Other Ambulatory Visit: Payer: Self-pay | Admitting: Family Medicine

## 2022-03-09 DIAGNOSIS — I10 Essential (primary) hypertension: Secondary | ICD-10-CM

## 2022-03-09 NOTE — Telephone Encounter (Signed)
Medication Refill - Medication:  ?atenolol (TENORMIN) 25 MG tablet  ? ?Has the patient contacted their pharmacy? Yes.   ?Contact PCP ? ?Preferred Pharmacy (with phone number or street name):  ?[place] [place]  ?[address], [place] [address]  ?Phone:  [phone]  Fax:  [phone]  ? ?Has the patient been seen for an appointment in the last year OR does the patient have an upcoming appointment? Yes.   ? ?Agent: Please be advised that RX refills may take up to 3 business days. We ask that you follow-up with your pharmacy. ?

## 2022-03-10 NOTE — Telephone Encounter (Signed)
Requested medication (s) are due for refill today:   Yes ? ?Requested medication (s) are on the active medication list:   Yes ? ?Future visit scheduled:   No ? ? ?Last ordered: 09/05/2021 #90, 1 refill ? ?Returned because no refills remain on this rx.  ? ?Requested Prescriptions  ?Pending Prescriptions Disp Refills  ? atenolol (TENORMIN) 25 MG tablet 90 tablet 1  ?  Sig: Take 1 tablet (25 mg total) by mouth in the morning. Take in addition to '50mg'$  in the morning.  ?  ? Cardiovascular: Beta Blockers 2 Failed - 03/10/2022  8:16 AM  ?  ?  Failed - Last BP in normal range  ?  BP Readings from Last 1 Encounters:  ?09/30/21 (!) 144/80  ?  ?  ?  ?  Passed - Cr in normal range and within 360 days  ?  Creatinine  ?Date Value Ref Range Status  ?07/15/2013 0.98 0.60 - 1.30 mg/dL Final  ? ?Creatinine, Ser  ?Date Value Ref Range Status  ?09/17/2021 0.91 0.57 - 1.00 mg/dL Final  ?  ?  ?  ?  Passed - Last Heart Rate in normal range  ?  Pulse Readings from Last 1 Encounters:  ?09/30/21 76  ?  ?  ?  ?  Passed - Valid encounter within last 6 months  ?  Recent Outpatient Visits   ? ?      ? 5 months ago Primary hypertension  ? [facility] Birdie Sons, MD  ? 8 months ago Primary hypertension  ? [facility] Caryn Section, Kirstie Peri, MD  ? 1 year ago COVID-19  ? [facility] Caryn Section, Kirstie Peri, MD  ? 1 year ago Cystitis  ? [place], [place]  ? 1 year ago Sinusitis, unspecified chronicity, unspecified location  ? [place], FNP  ? ?  ?  ? ?  ?  ?  ? ?

## 2022-03-10 NOTE — Telephone Encounter (Signed)
Did she leave a form for this or do I need to print one up.  ?

## 2022-03-11 MED ORDER — ATENOLOL 25 MG PO TABS: 25.0000 mg | ORAL_TABLET | Freq: Every morning | ORAL | 0 refills | Status: DC

## 2022-03-11 NOTE — Telephone Encounter (Signed)
Dr. Caryn Section ?Form is on your desk and patient informed we will let her know when ready. ?

## 2022-03-11 NOTE — Telephone Encounter (Signed)
Pt called back to report that she needs a form printed for her she did not drop one off. Please advise ?

## 2022-03-15 ENCOUNTER — Telehealth: Payer: Self-pay

## 2022-03-15 NOTE — Telephone Encounter (Signed)
Patient called and informed disability parking permit is signed and ready.  Pt will pick up at front desk.  ?

## 2022-05-24 ENCOUNTER — Other Ambulatory Visit: Payer: Self-pay | Admitting: Family Medicine

## 2022-06-04 ENCOUNTER — Emergency Department: Payer: Medicare Other

## 2022-06-04 ENCOUNTER — Inpatient Hospital Stay
Admission: EM | Admit: 2022-06-04 | Discharge: 2022-06-06 | DRG: 439 | Disposition: A | Discharge status: Home / Self Care | Payer: Medicare Other | Attending: Internal Medicine | Admitting: Internal Medicine

## 2022-06-04 ENCOUNTER — Other Ambulatory Visit: Payer: Self-pay

## 2022-06-04 DIAGNOSIS — I10 Essential (primary) hypertension: Secondary | ICD-10-CM | POA: Diagnosis present

## 2022-06-04 DIAGNOSIS — I491 Atrial premature depolarization: Secondary | ICD-10-CM | POA: Diagnosis present

## 2022-06-04 DIAGNOSIS — K859 Acute pancreatitis without necrosis or infection, unspecified: Principal | ICD-10-CM | POA: Diagnosis present

## 2022-06-04 DIAGNOSIS — Z8616 Personal history of COVID-19: Secondary | ICD-10-CM | POA: Diagnosis not present

## 2022-06-04 DIAGNOSIS — E785 Hyperlipidemia, unspecified: Secondary | ICD-10-CM | POA: Diagnosis present

## 2022-06-04 DIAGNOSIS — Z79899 Other long term (current) drug therapy: Secondary | ICD-10-CM

## 2022-06-04 DIAGNOSIS — Z9049 Acquired absence of other specified parts of digestive tract: Secondary | ICD-10-CM

## 2022-06-04 DIAGNOSIS — G5793 Unspecified mononeuropathy of bilateral lower limbs: Secondary | ICD-10-CM | POA: Diagnosis present

## 2022-06-04 DIAGNOSIS — Z90710 Acquired absence of both cervix and uterus: Secondary | ICD-10-CM | POA: Diagnosis not present

## 2022-06-04 DIAGNOSIS — Z91010 Allergy to peanuts: Secondary | ICD-10-CM | POA: Diagnosis not present

## 2022-06-04 DIAGNOSIS — K8500 Idiopathic acute pancreatitis without necrosis or infection: Secondary | ICD-10-CM

## 2022-06-04 DIAGNOSIS — Z8619 Personal history of other infectious and parasitic diseases: Secondary | ICD-10-CM | POA: Diagnosis not present

## 2022-06-04 DIAGNOSIS — Z853 Personal history of malignant neoplasm of breast: Secondary | ICD-10-CM

## 2022-06-04 DIAGNOSIS — Z8711 Personal history of peptic ulcer disease: Secondary | ICD-10-CM

## 2022-06-04 DIAGNOSIS — Z9013 Acquired absence of bilateral breasts and nipples: Secondary | ICD-10-CM

## 2022-06-04 DIAGNOSIS — Z8349 Family history of other endocrine, nutritional and metabolic diseases: Secondary | ICD-10-CM

## 2022-06-04 DIAGNOSIS — E039 Hypothyroidism, unspecified: Secondary | ICD-10-CM | POA: Diagnosis not present

## 2022-06-04 DIAGNOSIS — K219 Gastro-esophageal reflux disease without esophagitis: Secondary | ICD-10-CM | POA: Diagnosis present

## 2022-06-04 DIAGNOSIS — Z888 Allergy status to other drugs, medicaments and biological substances status: Secondary | ICD-10-CM

## 2022-06-04 DIAGNOSIS — K5732 Diverticulitis of large intestine without perforation or abscess without bleeding: Secondary | ICD-10-CM | POA: Diagnosis not present

## 2022-06-04 DIAGNOSIS — R162 Hepatomegaly with splenomegaly, not elsewhere classified: Secondary | ICD-10-CM | POA: Diagnosis present

## 2022-06-04 DIAGNOSIS — Z8249 Family history of ischemic heart disease and other diseases of the circulatory system: Secondary | ICD-10-CM

## 2022-06-04 DIAGNOSIS — Z201 Contact with and (suspected) exposure to tuberculosis: Secondary | ICD-10-CM | POA: Diagnosis present

## 2022-06-04 DIAGNOSIS — Z79811 Long term (current) use of aromatase inhibitors: Secondary | ICD-10-CM

## 2022-06-04 DIAGNOSIS — Z79890 Hormone replacement therapy (postmenopausal): Secondary | ICD-10-CM

## 2022-06-04 DIAGNOSIS — K760 Fatty (change of) liver, not elsewhere classified: Secondary | ICD-10-CM | POA: Diagnosis present

## 2022-06-04 DIAGNOSIS — R1033 Periumbilical pain: Secondary | ICD-10-CM

## 2022-06-04 DIAGNOSIS — Z91048 Other nonmedicinal substance allergy status: Secondary | ICD-10-CM

## 2022-06-04 DIAGNOSIS — Z6841 Body Mass Index (BMI) 40.0 and over, adult: Secondary | ICD-10-CM | POA: Diagnosis not present

## 2022-06-04 DIAGNOSIS — R1013 Epigastric pain: Secondary | ICD-10-CM | POA: Diagnosis not present

## 2022-06-04 DIAGNOSIS — Z972 Presence of dental prosthetic device (complete) (partial): Secondary | ICD-10-CM

## 2022-06-04 DIAGNOSIS — N281 Cyst of kidney, acquired: Secondary | ICD-10-CM | POA: Diagnosis not present

## 2022-06-04 DIAGNOSIS — Z881 Allergy status to other antibiotic agents status: Secondary | ICD-10-CM

## 2022-06-04 LAB — COMPREHENSIVE METABOLIC PANEL
ALT: 18 U/L (ref 0–44)
AST: 20 U/L (ref 15–41)
Albumin: 4.2 g/dL (ref 3.5–5.0)
Alkaline Phosphatase: 112 U/L (ref 38–126)
Anion gap: 11 (ref 5–15)
BUN: 15 mg/dL (ref 8–23)
CO2: 27 mmol/L (ref 22–32)
Calcium: 9.3 mg/dL (ref 8.9–10.3)
Chloride: 101 mmol/L (ref 98–111)
Creatinine, Ser: 0.89 mg/dL (ref 0.44–1.00)
GFR, Estimated: >60 mL/min (ref >60)
Glucose, Bld: 139 mg/dL — ABNORMAL HIGH (ref 70–99)
Potassium: 3.9 mmol/L (ref 3.5–5.1)
Sodium: 139 mmol/L (ref 135–145)
Total Bilirubin: 0.7 mg/dL (ref 0.3–1.2)
Total Protein: 8.1 g/dL (ref 6.5–8.1)

## 2022-06-04 LAB — URINALYSIS, ROUTINE W REFLEX MICROSCOPIC
Bacteria, UA: NONE SEEN
Bilirubin Urine: NEGATIVE
Glucose, UA: NEGATIVE mg/dL
Ketones, ur: NEGATIVE mg/dL
Leukocytes,Ua: NEGATIVE
Nitrite: NEGATIVE
Protein, ur: NEGATIVE mg/dL
Specific Gravity, Urine: 1.021 (ref 1.005–1.030)
pH: 6 (ref 5.0–8.0)

## 2022-06-04 LAB — CBC
HCT: 38.9 % (ref 36.0–46.0)
Hemoglobin: 11.9 g/dL — ABNORMAL LOW (ref 12.0–15.0)
MCH: 26.7 pg (ref 26.0–34.0)
MCHC: 30.6 g/dL (ref 30.0–36.0)
MCV: 87.2 fL (ref 80.0–100.0)
Platelets: 222 10*3/uL (ref 150–400)
RBC: 4.46 MIL/uL (ref 3.87–5.11)
RDW: 14.5 % (ref 11.5–15.5)
WBC: 10.4 10*3/uL (ref 4.0–10.5)
nRBC: 0 % (ref 0.0–0.2)

## 2022-06-04 LAB — LIPASE, BLOOD: Lipase: 368 U/L — ABNORMAL HIGH (ref 11–51)

## 2022-06-04 LAB — TROPONIN I (HIGH SENSITIVITY)
Troponin I (High Sensitivity): 5 ng/L (ref <18)
Troponin I (High Sensitivity): 5 ng/L (ref <18)

## 2022-06-04 MED ORDER — ONDANSETRON 4 MG PO TBDP
4.0000 mg | ORAL_TABLET | Freq: Once | ORAL | Status: AC | PRN
  Administered 2022-06-04: 4 mg via ORAL
  Filled 2022-06-04: qty 1

## 2022-06-04 MED ORDER — LACTATED RINGERS IV BOLUS
1000.0000 mL | Freq: Once | INTRAVENOUS | Status: AC
  Administered 2022-06-04: 1000 mL via INTRAVENOUS

## 2022-06-04 MED ORDER — IOHEXOL 300 MG/ML  SOLN
100.0000 mL | Freq: Once | INTRAMUSCULAR | Status: AC | PRN
  Administered 2022-06-04: 100 mL via INTRAVENOUS

## 2022-06-04 MED ORDER — FENTANYL CITRATE PF 50 MCG/ML IJ SOSY
50.0000 ug | PREFILLED_SYRINGE | Freq: Once | INTRAMUSCULAR | Status: AC
  Administered 2022-06-04: 50 ug via INTRAVENOUS
  Filled 2022-06-04: qty 1

## 2022-06-04 NOTE — ED Triage Notes (Signed)
Pt c/o epigastric/upper abdominal pain that started today and radiates into the back- states it started after going out for ice cream. Pt states she has had gallbladder removed, denies cardiac history. Pt denies SHOB, dizziness. Pt reports some nausea. Pt is AOX4, NAD noted.

## 2022-06-04 NOTE — ED Notes (Signed)
Pt to ED for upper abdominal pain that started this AM, pain radiates to back. Pt has hx of cholecystectomy. Pt has had some nausea, denies diarrhea and vomiting. Pt denies cardiac hx  Pt is A&Ox4.

## 2022-06-04 NOTE — ED Provider Notes (Signed)
[facility] Provider Note    Event Date/Time   First MD Initiated Contact with Patient 06/04/22 2200     (approximate)   History   Abdominal Pain   HPI  Kaylee Reyes is a 71 y.o. female who presents to the ED for evaluation of Abdominal Pain   I reviewed PCP visit from October. Morbidly obese patient with history of HTN, hypothyroidism,  Patient presents to ED for evaluation of 1 day of periumbilical and epigastric abdominal pain with associated nausea.  Denies any emesis, diarrhea, dysuria, chest pain, syncope or fevers.  Never felt this pain before.  She is s/p cholecystectomy.  Nondrinker.  Physical Exam   Triage Vital Signs: ED Triage Vitals  Enc Vitals Group     BP 06/04/22 1843 (!) 142/111     Pulse Rate 06/04/22 1843 67     Resp 06/04/22 1843 20     Temp 06/04/22 1843 98.6 F (37 C)     Temp Source 06/04/22 1843 Oral     SpO2 06/04/22 1843 96 %     Weight 06/04/22 1844 290 lb (131.5 kg)     Height 06/04/22 1844 '5\' 6"'$  (1.676 m)     Head Circumference --      Peak Flow --      Pain Score 06/04/22 1844 9     Pain Loc --      Pain Edu? --      Excl. in [place]? --     Most recent vital signs: Vitals:   06/04/22 2059 06/04/22 2250  BP: (!) 174/80 (!) 153/62  Pulse: 73 65  Resp: 17 15  Temp: 98.4 F (36.9 C)   SpO2: 97% 96%    General: Awake, no distress.  CV:  Good peripheral perfusion.  Resp:  Normal effort.  Abd:  No distention.  Periumbilical and epigastric tenderness without peritoneal features.  Lower abdomen is benign. MSK:  No deformity noted.  Neuro:  No focal deficits appreciated. Other:     ED Results / Procedures / Treatments   Labs (all labs ordered are listed, but only abnormal results are displayed) Labs Reviewed  LIPASE, BLOOD - Abnormal; Notable for the following components:      Result Value   Lipase 368 (*)    All other components within normal limits  COMPREHENSIVE METABOLIC PANEL - Abnormal; Notable  for the following components:   Glucose, Bld 139 (*)    All other components within normal limits  CBC - Abnormal; Notable for the following components:   Hemoglobin 11.9 (*)    All other components within normal limits  URINALYSIS, ROUTINE W REFLEX MICROSCOPIC - Abnormal; Notable for the following components:   Color, Urine YELLOW (*)    APPearance CLEAR (*)    Hgb urine dipstick SMALL (*)    All other components within normal limits  TROPONIN I (HIGH SENSITIVITY)  TROPONIN I (HIGH SENSITIVITY)    EKG Sinus rhythm, rate of 70 bpm.  Normal axis and intervals.  No evidence of acute ischemia.  RADIOLOGY CT abdomen/pelvis interpreted by me with peripancreatic stranding without abscess or pseudocyst  Official radiology report(s): CT ABDOMEN PELVIS W CONTRAST  Result Date: 06/04/2022 CLINICAL DATA:  Acute pancreatitis, evaluate complications or hepatobiliary obstruction Abdominal pain radiating to the back. EXAM: CT ABDOMEN AND PELVIS WITH CONTRAST TECHNIQUE: Multidetector CT imaging of the abdomen and pelvis was performed using the standard protocol following bolus administration of intravenous contrast. RADIATION DOSE REDUCTION: This  exam was performed according to the departmental dose-optimization program which includes automated exposure control, adjustment of the mA and/or kV according to patient size and/or use of iterative reconstruction technique. CONTRAST:  171m OMNIPAQUE IOHEXOL 300 MG/ML  SOLN COMPARISON:  Abdominal ultrasound 05/18/2021, remote PET-CT 10/23/2015 FINDINGS: Lower chest: Subsegmental linear atelectasis in the left lower lobe. No pleural effusion. Hepatobiliary: Diffusely decreased hepatic density consistent with steatosis. Liver is enlarged spanning 25 cm cranial caudal. No focal hepatic lesion. No capsular nodularity. Cholecystectomy. Normal common bile duct. No intrahepatic biliary ductal dilatation. Pancreas: Mild fat stranding and edema about the uncinate process.  No evidence of pancreatic necrosis or acute peripancreatic collection. No ductal dilatation. No visualized pancreatic mass. Spleen: Enlarged spanning 14.2 cm AP.  No focal abnormality. Adrenals/Urinary Tract: Normal adrenal glands. No hydronephrosis or perinephric edema. Homogeneous renal enhancement with symmetric excretion on delayed phase imaging. No renal calculi. Small exophytic cyst from the anterior lower kidney, needing no further follow-up. Urinary bladder is only minimally distended, normal for degree of distension. Stomach/Bowel: Small hiatal hernia. Ingested material within the stomach. There is mild edema about the third and fourth portion of the duodenum, likely related to primary pancreatic inflammation rather than duodenal inflammation. The small bowel is otherwise unremarkable. No small bowel obstruction. Medial deviation of the cecum in the central abdomen. Normal air-filled appendix in the mid abdomen. There is no mesenteric swirling or twisting. Mild multifocal colonic diverticulosis. No diverticulitis or acute colonic inflammation the sigmoid colon is redundant. Vascular/Lymphatic: Aortic atherosclerosis. No aortic aneurysm. Patent portal vein. Patent splenic and mesenteric veins. No abdominopelvic adenopathy. Reproductive: Hysterectomy. Stable appearance of the vaginal cuff from prior PET. The left ovary remains prominent size spanning 2.6 cm, however this is unchanged from 2016 CT and considered benign. No adnexal mass. Other: Small fat containing umbilical hernia. No free air or ascites. Musculoskeletal: Diffuse degenerative change in the lumbar spine with multilevel degenerative disc disease and facet hypertrophy. Bilateral hip osteoarthritis. There are no acute or suspicious osseous abnormalities. IMPRESSION: 1. Mild fat stranding and edema about the uncinate process of the pancreas consistent with acute pancreatitis. No evidence of pancreatic necrosis or acute peripancreatic collection. 2.  Hepatomegaly and hepatic steatosis.  No biliary obstruction. 3. Mild splenomegaly. 4. Colonic diverticulosis without diverticulitis. Aortic Atherosclerosis (ICD10-I70.0). Electronically Signed   By: MKeith RakeM.D.   On: 06/04/2022 22:51    PROCEDURES and INTERVENTIONS:  .1-3 Lead EKG Interpretation  Performed by: SVladimir Crofts MD Authorized by: SVladimir Crofts MD     Interpretation: normal     ECG rate:  68   ECG rate assessment: normal     Rhythm: sinus rhythm     Ectopy: none     Conduction: normal     Medications  ondansetron (ZOFRAN-ODT) disintegrating tablet 4 mg (4 mg Oral Given 06/04/22 1907)  fentaNYL (SUBLIMAZE) injection 50 mcg (50 mcg Intravenous Given 06/04/22 2222)  lactated ringers bolus 1,000 mL (1,000 mLs Intravenous New Bag/Given 06/04/22 2227)  iohexol (OMNIPAQUE) 300 MG/ML solution 100 mL (100 mLs Intravenous Contrast Given 06/04/22 2233)     IMPRESSION / MDM / ASSESSMENT AND PLAN / ED COURSE  I reviewed the triage vital signs and the nursing notes.  Differential diagnosis includes, but is not limited to, choledocholithiasis, pancreatitis, SBO, cystitis, ACS  {Patient presents with symptoms of an acute illness or injury that is potentially life-threatening.  [age]-yearfemale presents with acute abdominal pain and evidence of acute pancreatitis requiring medical admission.  She has periumbilical  pain without evidence of peritoneal features.  Blood work with elevated lipase.  CBC and CMP are essentially normal.  Negative troponins and no evidence of ACS.  Urine is clear.  CT without complicating features and with peripancreatic inflammation.  Due to her continued symptoms we will consult medicine for admission.  Clinical Course as of 06/04/22 2342  Fri Jun 04, 2022  2322 Reassessed.  Still in pain and feeling nauseous.  We discussed reassuring CT.  She would like to be admitted and I think that is reasonable. [DS]    Clinical Course User Index [DS] Vladimir Crofts,  MD     FINAL CLINICAL IMPRESSION(S) / ED DIAGNOSES   Final diagnoses:  Idiopathic acute pancreatitis without infection or necrosis  Periumbilical abdominal pain     Rx / DC Orders   ED Discharge Orders     None        Note:  This document was prepared using Dragon voice recognition software and may include unintentional dictation errors.   Vladimir Crofts, MD 06/04/22 [phone]

## 2022-06-05 DIAGNOSIS — Z9049 Acquired absence of other specified parts of digestive tract: Secondary | ICD-10-CM | POA: Diagnosis not present

## 2022-06-05 DIAGNOSIS — R162 Hepatomegaly with splenomegaly, not elsewhere classified: Secondary | ICD-10-CM | POA: Diagnosis present

## 2022-06-05 DIAGNOSIS — Z8711 Personal history of peptic ulcer disease: Secondary | ICD-10-CM | POA: Diagnosis not present

## 2022-06-05 DIAGNOSIS — E039 Hypothyroidism, unspecified: Secondary | ICD-10-CM | POA: Diagnosis present

## 2022-06-05 DIAGNOSIS — Z8349 Family history of other endocrine, nutritional and metabolic diseases: Secondary | ICD-10-CM | POA: Diagnosis not present

## 2022-06-05 DIAGNOSIS — Z8619 Personal history of other infectious and parasitic diseases: Secondary | ICD-10-CM | POA: Diagnosis not present

## 2022-06-05 DIAGNOSIS — K219 Gastro-esophageal reflux disease without esophagitis: Secondary | ICD-10-CM

## 2022-06-05 DIAGNOSIS — E785 Hyperlipidemia, unspecified: Secondary | ICD-10-CM | POA: Diagnosis present

## 2022-06-05 DIAGNOSIS — I491 Atrial premature depolarization: Secondary | ICD-10-CM | POA: Diagnosis present

## 2022-06-05 DIAGNOSIS — K859 Acute pancreatitis without necrosis or infection, unspecified: Secondary | ICD-10-CM | POA: Diagnosis present

## 2022-06-05 DIAGNOSIS — Z881 Allergy status to other antibiotic agents status: Secondary | ICD-10-CM | POA: Diagnosis not present

## 2022-06-05 DIAGNOSIS — Z853 Personal history of malignant neoplasm of breast: Secondary | ICD-10-CM

## 2022-06-05 DIAGNOSIS — Z8616 Personal history of COVID-19: Secondary | ICD-10-CM | POA: Diagnosis not present

## 2022-06-05 DIAGNOSIS — Z9013 Acquired absence of bilateral breasts and nipples: Secondary | ICD-10-CM | POA: Diagnosis not present

## 2022-06-05 DIAGNOSIS — Z6841 Body Mass Index (BMI) 40.0 and over, adult: Secondary | ICD-10-CM | POA: Diagnosis not present

## 2022-06-05 DIAGNOSIS — K8500 Idiopathic acute pancreatitis without necrosis or infection: Secondary | ICD-10-CM

## 2022-06-05 DIAGNOSIS — I10 Essential (primary) hypertension: Secondary | ICD-10-CM

## 2022-06-05 DIAGNOSIS — R1013 Epigastric pain: Secondary | ICD-10-CM | POA: Diagnosis present

## 2022-06-05 DIAGNOSIS — K760 Fatty (change of) liver, not elsewhere classified: Secondary | ICD-10-CM | POA: Diagnosis present

## 2022-06-05 DIAGNOSIS — Z8249 Family history of ischemic heart disease and other diseases of the circulatory system: Secondary | ICD-10-CM | POA: Diagnosis not present

## 2022-06-05 DIAGNOSIS — G5793 Unspecified mononeuropathy of bilateral lower limbs: Secondary | ICD-10-CM | POA: Diagnosis present

## 2022-06-05 DIAGNOSIS — Z201 Contact with and (suspected) exposure to tuberculosis: Secondary | ICD-10-CM | POA: Diagnosis present

## 2022-06-05 DIAGNOSIS — Z972 Presence of dental prosthetic device (complete) (partial): Secondary | ICD-10-CM | POA: Diagnosis not present

## 2022-06-05 DIAGNOSIS — Z90710 Acquired absence of both cervix and uterus: Secondary | ICD-10-CM | POA: Diagnosis not present

## 2022-06-05 DIAGNOSIS — Z91010 Allergy to peanuts: Secondary | ICD-10-CM | POA: Diagnosis not present

## 2022-06-05 HISTORY — DX: Acute pancreatitis without necrosis or infection, unspecified: K85.90

## 2022-06-05 LAB — CBC
HCT: 33.8 % — ABNORMAL LOW (ref 36.0–46.0)
Hemoglobin: 10.6 g/dL — ABNORMAL LOW (ref 12.0–15.0)
MCH: 27.5 pg (ref 26.0–34.0)
MCHC: 31.4 g/dL (ref 30.0–36.0)
MCV: 87.6 fL (ref 80.0–100.0)
Platelets: 165 10*3/uL (ref 150–400)
RBC: 3.86 MIL/uL — ABNORMAL LOW (ref 3.87–5.11)
RDW: 14.4 % (ref 11.5–15.5)
WBC: 8.1 10*3/uL (ref 4.0–10.5)
nRBC: 0 % (ref 0.0–0.2)

## 2022-06-05 LAB — BASIC METABOLIC PANEL
Anion gap: 6 (ref 5–15)
BUN: 13 mg/dL (ref 8–23)
CO2: 28 mmol/L (ref 22–32)
Calcium: 9 mg/dL (ref 8.9–10.3)
Chloride: 104 mmol/L (ref 98–111)
Creatinine, Ser: 0.78 mg/dL (ref 0.44–1.00)
GFR, Estimated: >60 mL/min (ref >60)
Glucose, Bld: 123 mg/dL — ABNORMAL HIGH (ref 70–99)
Potassium: 3.7 mmol/L (ref 3.5–5.1)
Sodium: 138 mmol/L (ref 135–145)

## 2022-06-05 LAB — LIPASE, BLOOD: Lipase: 173 U/L — ABNORMAL HIGH (ref 11–51)

## 2022-06-05 LAB — HIV ANTIBODY (ROUTINE TESTING W REFLEX): HIV Screen 4th Generation wRfx: NONREACTIVE

## 2022-06-05 MED ORDER — TRAZODONE HCL 50 MG PO TABS: 25.0000 mg | ORAL_TABLET | Freq: Every evening | ORAL | Status: DC | PRN

## 2022-06-05 MED ORDER — METOCLOPRAMIDE HCL 5 MG/ML IJ SOLN
10.0000 mg | Freq: Once | INTRAMUSCULAR | Status: AC
Start: 2022-06-05 — End: 2022-06-05
  Administered 2022-06-05: 10 mg via INTRAVENOUS
  Filled 2022-06-05: qty 2

## 2022-06-05 MED ORDER — DOCUSATE SODIUM 100 MG PO CAPS
200.0000 mg | ORAL_CAPSULE | Freq: Every day | ORAL | Status: DC
  Administered 2022-06-05 – 2022-06-06 (×2): 200 mg via ORAL
  Filled 2022-06-05 (×2): qty 2

## 2022-06-05 MED ORDER — ONDANSETRON HCL 4 MG/2ML IJ SOLN: 4.0000 mg | Freq: Four times a day (QID) | INTRAMUSCULAR | Status: DC | PRN

## 2022-06-05 MED ORDER — ONDANSETRON HCL 4 MG/2ML IJ SOLN
4.0000 mg | Freq: Four times a day (QID) | INTRAMUSCULAR | Status: DC | PRN
Start: 2022-06-05 — End: 2022-06-05
  Administered 2022-06-05: 4 mg via INTRAVENOUS
  Filled 2022-06-05: qty 2

## 2022-06-05 MED ORDER — TRIAMCINOLONE ACETONIDE 0.025 % EX CREA
1.0000 | TOPICAL_CREAM | Freq: Two times a day (BID) | CUTANEOUS | Status: DC
  Filled 2022-06-05: qty 15

## 2022-06-05 MED ORDER — LEVOTHYROXINE SODIUM 50 MCG PO TABS
75.0000 ug | ORAL_TABLET | Freq: Every day | ORAL | Status: DC
  Administered 2022-06-05 – 2022-06-06 (×2): 75 ug via ORAL
  Filled 2022-06-05: qty 2
  Filled 2022-06-05: qty 1

## 2022-06-05 MED ORDER — MAGNESIUM OXIDE -MG SUPPLEMENT 400 (240 MG) MG PO TABS
600.0000 mg | ORAL_TABLET | Freq: Every day | ORAL | Status: DC
  Administered 2022-06-05 – 2022-06-06 (×2): 600 mg via ORAL
  Filled 2022-06-05 (×2): qty 2

## 2022-06-05 MED ORDER — ENOXAPARIN SODIUM 80 MG/0.8ML IJ SOSY
0.5000 mg/kg | PREFILLED_SYRINGE | INTRAMUSCULAR | Status: DC
Start: 2022-06-05 — End: 2022-06-06
  Administered 2022-06-05 – 2022-06-06 (×2): 65 mg via SUBCUTANEOUS
  Filled 2022-06-05 (×2): qty 0.65

## 2022-06-05 MED ORDER — ATENOLOL 25 MG PO TABS: 25.0000 mg | ORAL_TABLET | Freq: Every morning | ORAL | Status: DC

## 2022-06-05 MED ORDER — MECLIZINE HCL 25 MG PO TABS
12.5000 mg | ORAL_TABLET | Freq: Three times a day (TID) | ORAL | Status: DC | PRN
Start: 2022-06-05 — End: 2022-06-06

## 2022-06-05 MED ORDER — ONDANSETRON HCL 4 MG PO TABS: 4.0000 mg | ORAL_TABLET | Freq: Four times a day (QID) | ORAL | Status: DC | PRN

## 2022-06-05 MED ORDER — ONDANSETRON HCL 4 MG PO TABS
4.0000 mg | ORAL_TABLET | ORAL | Status: DC | PRN
  Administered 2022-06-05: 4 mg via ORAL
  Filled 2022-06-05: qty 1

## 2022-06-05 MED ORDER — ACETAMINOPHEN 650 MG RE SUPP: 650.0000 mg | Freq: Four times a day (QID) | RECTAL | Status: DC | PRN

## 2022-06-05 MED ORDER — SODIUM CHLORIDE 0.9 % IV SOLN: INTRAVENOUS | Status: DC

## 2022-06-05 MED ORDER — MAGNESIUM HYDROXIDE 400 MG/5ML PO SUSP: 30.0000 mL | Freq: Every day | ORAL | Status: DC | PRN

## 2022-06-05 MED ORDER — ACETAMINOPHEN 325 MG PO TABS: 650.0000 mg | ORAL_TABLET | Freq: Four times a day (QID) | ORAL | Status: DC | PRN

## 2022-06-05 MED ORDER — PANTOPRAZOLE SODIUM 20 MG PO TBEC
20.0000 mg | DELAYED_RELEASE_TABLET | Freq: Every day | ORAL | Status: DC
  Administered 2022-06-05 – 2022-06-06 (×2): 20 mg via ORAL
  Filled 2022-06-05 (×2): qty 1

## 2022-06-05 MED ORDER — MORPHINE SULFATE (PF) 2 MG/ML IV SOLN
2.0000 mg | INTRAVENOUS | Status: DC | PRN
  Administered 2022-06-05 – 2022-06-06 (×6): 2 mg via INTRAVENOUS
  Filled 2022-06-05 (×6): qty 1

## 2022-06-05 MED ORDER — ATENOLOL 25 MG PO TABS
75.0000 mg | ORAL_TABLET | Freq: Every day | ORAL | Status: DC
  Administered 2022-06-05: 25 mg via ORAL
  Administered 2022-06-06: 75 mg via ORAL
  Filled 2022-06-05 (×2): qty 3

## 2022-06-05 NOTE — Progress Notes (Signed)
PHARMACIST - PHYSICIAN COMMUNICATION  CONCERNING:  Enoxaparin (Lovenox) for DVT Prophylaxis    RECOMMENDATION: Patient was prescribed enoxaprin '40mg'$  q24 hours for VTE prophylaxis.   Filed Weights   06/04/22 1844  Weight: 131.5 kg (290 lb)    Body mass index is 46.81 kg/m.  Estimated Creatinine Clearance: 81.9 mL/min (by C-G formula based on SCr of 0.89 mg/dL).   Based on [place] patient is candidate for enoxaparin 0.'5mg'$ /kg TBW SQ every 24 hours based on BMI being >30.  DESCRIPTION: Pharmacy has adjusted enoxaparin dose per [facility] policy.  Patient is now receiving enoxaparin 0.5 mg/kg every 24 hours   Renda Rolls, PharmD, [facility] 06/05/2022 2:30 AM

## 2022-06-05 NOTE — Plan of Care (Signed)
  Problem: Pain Managment: Goal: General experience of comfort will improve Outcome: Progressing   Problem: Safety: Goal: Ability to remain free from injury will improve Outcome: Progressing   

## 2022-06-05 NOTE — H&P (Signed)
[place]   PATIENT NAME: Kaylee Reyes    MR#:  [id number]  DATE OF BIRTH:  1951/01/27  DATE OF ADMISSION:  06/04/2022  PRIMARY CARE PHYSICIAN: Birdie Sons, MD   Patient is coming from: Home  REQUESTING/REFERRING PHYSICIAN: Vladimir Crofts, MD  CHIEF COMPLAINT:   Chief Complaint  Patient presents with   Abdominal Pain    HISTORY OF PRESENT ILLNESS:  Kaylee Reyes is a 71 y.o. Caucasian female with medical history significant for GERD, hypertension, dyslipidemia, hypothyroidism and  PUD, who presented to the ER of epigastric and left upper quadrant abdominal pain that started earlier during the day with associated nausea without vomiting.  She denies diarrhea or melena or bright red blood per rectum.  No fever or chills.  Her pain has been extending to the right side of the abdomen and to her back.  No chest pain or palpitations or cough or wheezing.  No dysuria, oliguria or hematuria or flank pain.  ED Course: Upon presenting to the emergency room, BP was 142/111 and later 174/80 with otherwise normal vital signs Labs revealed a serum lipase of 368 and a blood glucose of 139 with otherwise unremarkable CMP.  High-sensitivity troponin I was 5 twice.  CBC showed hemoglobin of 11.9 hematocrit 38.9 close to baseline. EKG as reviewed by me : EKG showed normal sinus rhythm with a rate of 70 with short PR interval and premature atrial complexes with T wave inversion in V1 and V2 and aVL Imaging: Abdominal and pelvic CT scan revealed mild fat stranding and edema around the uncinate process of the pancreas consistent with acute pancreatitis with no evidence of pancreatic necrosis or acute peripancreatic collection.  It showed hepatomegaly and hepatic steatosis with no biliary obstruction.  Also showed mild splenomegaly and colonic diverticulosis without diverticulitis.  The patient was given IV heparin bolus and drip, DuoNeb and 125 mg of IV Solu-Medrol.  She will be admitted to a  medical bed for further evaluation and management. PAST MEDICAL HISTORY:   Past Medical History:  Diagnosis Date   Anemia    Body tinea 10/01/2015   Previously treated by Dr. Koleen Nimrod    COVID-19 02/23/2021   MAB infusion 01/23/2021   GERD (gastroesophageal reflux disease)    History of breast cancer 09/2015   breast cancer   History of chicken pox    History of peptic ulcer disease 09/25/2015   Hyperlipidemia    Hypertension    Hypothyroidism    Neuropathy of foot    bilateral   Pinched nerve    PONV (postoperative nausea and vomiting)    exploratory surgery at [facility]   Thyroid disease    Tuberculosis exposure    Mother had TB.  Pt shows (+) on tests, but has never had.   Vertigo    last episode over 1 yr ago   Wears dentures    partial upper and lower    PAST SURGICAL HISTORY:   Past Surgical History:  Procedure Laterality Date   ABDOMINAL HYSTERECTOMY  07/13/2013   Total. with BSO for postmenopausal bleeding and fibroids with large cervical polyp. Dr. Levy Sjogren and Dr. Glennon Mac at [place]  04/17/2011   Hepatomegaly with borderline slenomegaly. Suggestive of fatty liver. s/p cholecystectomy. Portions of aorta obscured   BREAST SURGERY Right 2011   BREAST BIOPSY   CESAREAN SECTION  1988   CHOLECYSTECTOMY  1990   COLONOSCOPY WITH PROPOFOL N/A 09/01/2017   Procedure:  COLONOSCOPY WITH PROPOFOL;  Surgeon: [name], MD;  Location: [facility];  Service: Gastroenterology;  Laterality: N/A;   DIAGNOSTIC LAPAROSCOPY     ESOPHAGOGASTRODUODENOSCOPY (EGD) WITH PROPOFOL N/A 01/17/2020   Procedure: ESOPHAGOGASTRODUODENOSCOPY (EGD) WITH PROPOFOL;  Surgeon: [name], MD;  Location: [facility];  Service: Endoscopy;  Laterality: N/A;   EXPLORATORY LAPAROTOMY     Left breast   MASTECTOMY     MASTECTOMY W/ SENTINEL NODE BIOPSY Bilateral 10/28/2015   Procedure: MASTECTOMY WITH SENTINEL LYMPH NODE BIOPSY;  Surgeon: Hubbard Robinson, MD;  Location:  [facility];  Service: General;  Laterality: Bilateral;   PORT-A-CATH REMOVAL Left 10/11/2016   Procedure: REMOVAL PORT-A-CATH;  Surgeon: Hubbard Robinson, MD;  Location: [facility];  Service: General;  Laterality: Left;   PORTACATH PLACEMENT Left 12/23/2015   Procedure: INSERTION PORT-A-CATH;  Surgeon: Hubbard Robinson, MD;  Location: [facility];  Service: General;  Laterality: Left;   WRIST SURGERY      SOCIAL HISTORY:   Social History   Tobacco Use   Smoking status: Never   Smokeless tobacco: Never  Substance Use Topics   Alcohol use: No    Alcohol/week: 0.0 standard drinks of alcohol    FAMILY HISTORY:   Family History  Problem Relation Age of Onset   Hypertension Mother    Thyroid disease Mother    Hypertension Brother    Diabetes Brother    Breast cancer Neg Hx    Ovarian cancer Neg Hx    Colon cancer Neg Hx     DRUG ALLERGIES:   Allergies  Allergen Reactions   Penicillins Shortness Of Breath    Has patient had a PCN reaction causing immediate rash, facial/tongue/throat swelling, SOB or lightheadedness with hypotension: Yes Has patient had a PCN reaction causing severe rash involving mucus membranes or skin necrosis: No Has patient had a PCN reaction that required hospitalization No Has patient had a PCN reaction occurring within the last 10 years: No If all of the above answers are "NO", then may proceed with Cephalosporin use.    Nickel Hives   Keflex [Cephalexin] Swelling    Throat swelling   Pravastatin Sodium Other (See Comments)    Myalgia     REVIEW OF SYSTEMS:   ROS As per history of present illness. All pertinent systems were reviewed above. Constitutional, HEENT, cardiovascular, respiratory, GI, GU, musculoskeletal, neuro, psychiatric, endocrine, integumentary and hematologic systems were reviewed and are otherwise negative/unremarkable except for positive findings mentioned above in the HPI.   MEDICATIONS AT HOME:   Prior to Admission  medications   Medication Sig Start Date End Date Taking? Authorizing Provider  atenolol (TENORMIN) 25 MG tablet Take 1 tablet (25 mg total) by mouth in the morning. Take in addition to '50mg'$  in the morning. 03/11/22  Yes Birdie Sons, MD  atenolol (TENORMIN) 50 MG tablet TAKE 1 TABLET DAILY 02/22/22  Yes Birdie Sons, MD  B Complex-C (SUPER B COMPLEX PO) Take 1 tablet by mouth daily.   Yes [provider]  CALCIUM CITRATE PO Take 1 tablet by mouth daily.   Yes [provider]  Carboxymeth-Glycerin-Polysorb (REFRESH OPTIVE MEGA-3 OP) Place 1 drop into both eyes daily.   Yes [provider]  docusate sodium (COLACE) 100 MG capsule Take 200 mg by mouth daily.    Yes [provider]  ELDERBERRY PO Take by mouth daily.    Yes [provider]  hydrochlorothiazide (HYDRODIURIL) 25 MG tablet TAKE 1 TABLET DAILY  05/24/22  Yes Birdie Sons, MD  lansoprazole (PREVACID) 30 MG capsule TAKE 1 CAPSULE BY MOUTH DAILY AT NOON(12:00 PM) 01/07/22  Yes Birdie Sons, MD  levothyroxine (SYNTHROID) 75 MCG tablet TAKE 1 TABLET DAILY 02/03/22  Yes Birdie Sons, MD  Magnesium 400 MG TABS Take 600 mg by mouth daily.    Yes [provider]  meclizine (ANTIVERT) 12.5 MG tablet TAKE 1 TABLET(12.5 MG) BY MOUTH THREE TIMES DAILY AS NEEDED FOR DIZZINESS 08/20/21  Yes Fisher, Kirstie Peri, MD  Misc Natural Products (TURMERIC CURCUMIN) CAPS Take 1 capsule by mouth daily.   Yes [provider]  triamcinolone (KENALOG) 0.025 % cream Apply 1 Application topically 2 (two) times daily. 05/17/22  Yes [provider]  Vitamin D, Ergocalciferol, (DRISDOL) 1.25 MG (50000 UNIT) CAPS capsule TAKE 1 CAPSULE BY MOUTH 1 TIME A WEEK 02/16/22  Yes Lloyd Huger, MD  vitamin E 180 MG (400 UNITS) capsule Take by mouth.   Yes [provider]  letrozole (FEMARA) 2.5 MG tablet Take 2.5 mg by mouth daily. Patient not taking: Reported on 06/04/2022    [provider]  fluticasone (FLONASE) 50 MCG/ACT nasal spray Place 2 sprays into both nostrils daily. 01/21/21 01/21/21  Rodriguez-Southworth, Sunday Spillers, PA-C      VITAL SIGNS:  Blood pressure (!) 143/61, pulse 69, temperature 98.4 F (36.9 C), resp. rate 20, height '5\' 6"'$  (1.676 m), weight 131.5 kg, SpO2 95 %.  PHYSICAL EXAMINATION:  Physical Exam  GENERAL:  71 y.o.-year-old patient lying in the bed with no acute distress.  EYES: Pupils equal, round, reactive to light and accommodation. No scleral icterus. Extraocular muscles intact.  HEENT: Head atraumatic, normocephalic. Oropharynx and nasopharynx clear.  NECK:  Supple, no jugular venous distention. No thyroid enlargement, no tenderness.  LUNGS: Normal breath sounds bilaterally, no wheezing, rales,rhonchi or crepitation. No use of accessory muscles of respiration.  CARDIOVASCULAR: Regular rate and rhythm, S1, S2 normal. No murmurs, rubs, or gallops.  ABDOMEN: Soft, nondistended, with mild epigastric and left upper quadrant tenderness without rebound tenderness guarding or rigidity.  Bowel sounds present. No organomegaly or mass.  EXTREMITIES: No pedal edema, cyanosis, or clubbing.  NEUROLOGIC: Cranial nerves II through XII are intact. Muscle strength 5/5 in all extremities. Sensation intact. Gait not checked.  PSYCHIATRIC: The patient is alert and oriented x 3.  Normal affect and good eye contact. SKIN: No obvious rash, lesion, or ulcer.   LABORATORY PANEL:   CBC Recent Labs  Lab 06/04/22 1852  WBC 10.4  HGB 11.9*  HCT 38.9  PLT 222   ------------------------------------------------------------------------------------------------------------------  Chemistries  Recent Labs  Lab 06/04/22 1852  NA 139  K 3.9  CL 101  CO2 27  GLUCOSE 139*  BUN 15  CREATININE 0.89  CALCIUM 9.3  AST 20  ALT 18  ALKPHOS 112  BILITOT 0.7    ------------------------------------------------------------------------------------------------------------------  Cardiac Enzymes No results for input(s): "TROPONINI" in the last 168 hours. ------------------------------------------------------------------------------------------------------------------  RADIOLOGY:  CT ABDOMEN PELVIS W CONTRAST  Result Date: 06/04/2022 CLINICAL DATA:  Acute pancreatitis, evaluate complications or hepatobiliary obstruction Abdominal pain radiating to the back. EXAM: CT ABDOMEN AND PELVIS WITH CONTRAST TECHNIQUE: Multidetector CT imaging of the abdomen and pelvis was performed using the standard protocol following bolus administration of intravenous contrast. RADIATION DOSE REDUCTION: This exam was performed according to the departmental dose-optimization program which includes automated exposure control, adjustment of the mA and/or kV according to patient size and/or use of iterative reconstruction technique.  CONTRAST:  148m OMNIPAQUE IOHEXOL 300 MG/ML  SOLN COMPARISON:  Abdominal ultrasound 05/18/2021, remote PET-CT 10/23/2015 FINDINGS: Lower chest: Subsegmental linear atelectasis in the left lower lobe. No pleural effusion. Hepatobiliary: Diffusely decreased hepatic density consistent with steatosis. Liver is enlarged spanning 25 cm cranial caudal. No focal hepatic lesion. No capsular nodularity. Cholecystectomy. Normal common bile duct. No intrahepatic biliary ductal dilatation. Pancreas: Mild fat stranding and edema about the uncinate process. No evidence of pancreatic necrosis or acute peripancreatic collection. No ductal dilatation. No visualized pancreatic mass. Spleen: Enlarged spanning 14.2 cm AP.  No focal abnormality. Adrenals/Urinary Tract: Normal adrenal glands. No hydronephrosis or perinephric edema. Homogeneous renal enhancement with symmetric excretion on delayed phase imaging. No renal calculi. Small exophytic cyst from the anterior lower kidney,  needing no further follow-up. Urinary bladder is only minimally distended, normal for degree of distension. Stomach/Bowel: Small hiatal hernia. Ingested material within the stomach. There is mild edema about the third and fourth portion of the duodenum, likely related to primary pancreatic inflammation rather than duodenal inflammation. The small bowel is otherwise unremarkable. No small bowel obstruction. Medial deviation of the cecum in the central abdomen. Normal air-filled appendix in the mid abdomen. There is no mesenteric swirling or twisting. Mild multifocal colonic diverticulosis. No diverticulitis or acute colonic inflammation the sigmoid colon is redundant. Vascular/Lymphatic: Aortic atherosclerosis. No aortic aneurysm. Patent portal vein. Patent splenic and mesenteric veins. No abdominopelvic adenopathy. Reproductive: Hysterectomy. Stable appearance of the vaginal cuff from prior PET. The left ovary remains prominent size spanning 2.6 cm, however this is unchanged from 2016 CT and considered benign. No adnexal mass. Other: Small fat containing umbilical hernia. No free air or ascites. Musculoskeletal: Diffuse degenerative change in the lumbar spine with multilevel degenerative disc disease and facet hypertrophy. Bilateral hip osteoarthritis. There are no acute or suspicious osseous abnormalities. IMPRESSION: 1. Mild fat stranding and edema about the uncinate process of the pancreas consistent with acute pancreatitis. No evidence of pancreatic necrosis or acute peripancreatic collection. 2. Hepatomegaly and hepatic steatosis.  No biliary obstruction. 3. Mild splenomegaly. 4. Colonic diverticulosis without diverticulitis. Aortic Atherosclerosis (ICD10-I70.0). Electronically Signed   By: MKeith RakeM.D.   On: 06/04/2022 22:51      IMPRESSION AND PLAN:  Assessment and Plan: * Acute pancreatitis - The patient will be admitted to a medical bed. - Pain management will be provided. - The patient  will be kept n.p.o. - We will stop her HCTZ that could be the culprit for her pancreatitis.  She has no history of alcohol abuse and she is status post cholecystectomy with no evidence for missed stones. - We will follow daily serum lipase levels.  Hypertension - We will continue atenolol while stopping HCTZ.  Hypothyroidism - We will continue Synthroid.  GERD (gastroesophageal reflux disease) Any PPI therapy.  History of breast cancer - We will continue letrozole.    DVT prophylaxis: Lovenox.  Advanced Care Planning:  Code Status: full code.  Family Communication:  The plan of care was discussed in details with the patient (and family). I answered all questions. The patient agreed to proceed with the above mentioned plan. Further management will depend upon hospital course. Disposition Plan: Back to previous home environment Consults called: none.  All the records are reviewed and case discussed with ED provider.  Status is: Inpatient  At the time of the admission, it appears that the appropriate admission status for this patient is inpatient.  This is judged to be reasonable and necessary in  order to provide the required intensity of service to ensure the patient's safety given the presenting symptoms, physical exam findings and initial radiographic and laboratory data in the context of comorbid conditions.  The patient requires inpatient status due to high intensity of service, high risk of further deterioration and high frequency of surveillance required.  I certify that at the time of admission, it is my clinical judgment that the patient will require inpatient hospital care extending more than 2 midnights.                            Dispo: The patient is from: Home              Anticipated d/c is to: Home              Patient currently is not medically stable to d/c.              Difficult to place patient: No  Christel Mormon M.D on 06/05/2022 at 6:08 AM  [facility]    From 7 PM-7 AM, contact night-coverage [email or web address]  CC: Primary care physician; Birdie Sons, MD

## 2022-06-05 NOTE — Assessment & Plan Note (Signed)
on PPI therapy. 

## 2022-06-05 NOTE — Assessment & Plan Note (Signed)
>>  ASSESSMENT AND PLAN FOR ESSENTIAL HYPERTENSION WRITTEN ON 06/06/2022  8:34 AM BY PATEL, SONA, MD  -  continue atenolol  --d/c HCTZ.

## 2022-06-05 NOTE — Assessment & Plan Note (Signed)
-   We will continue atenolol while stopping HCTZ.

## 2022-06-05 NOTE — ED Notes (Signed)
Pt given clear liq breakfast tray. Ate about 75% of tray with no issues. States nausea is better.

## 2022-06-05 NOTE — Assessment & Plan Note (Signed)
-   received IVF. Tolerating FLD - Pain meds prn -d/c HCTZ that could be the culprit for her pancreatitis.  She has no history of alcohol abuse and she is status post cholecystectomy with no evidence for missed stones. -Lipase trending down to 98.

## 2022-06-05 NOTE — Assessment & Plan Note (Signed)
-    continue Synthroid. 

## 2022-06-05 NOTE — Progress Notes (Signed)
Patient seen and examined in the emergency room. She came in with epigastric abdominal pain. She had gone out with her sister to celebrate her birthday and ate some burgers started noticing discomfort in her upper abdomen which worsened over the day radiating to the back.  Denies any vomiting however did have some nausea. No diarrhea. Found to have mild acute pancreatitis. Hydrochlorothiazide discontinued. Patient is status post cholecystectomy. Pain much improved now. Tolerating clear liquid diet lipase trending down. Will advance to full liquid and continue IV fluids. If continues to show improvement anticipate discharge tomorrow. Discussed with patient and sister present in the ER and there in agreement with plan

## 2022-06-05 NOTE — ED Notes (Signed)
Hospitalist at bedside 

## 2022-06-05 NOTE — Progress Notes (Signed)
Discoloration on back of the legs

## 2022-06-05 NOTE — Assessment & Plan Note (Signed)
-   We will continue letrozole.

## 2022-06-06 DIAGNOSIS — K859 Acute pancreatitis without necrosis or infection, unspecified: Secondary | ICD-10-CM | POA: Diagnosis not present

## 2022-06-06 LAB — LIPASE, BLOOD: Lipase: 98 U/L — ABNORMAL HIGH (ref 11–51)

## 2022-06-06 MED ORDER — TRAMADOL HCL 50 MG PO TABS: 50.0000 mg | ORAL_TABLET | Freq: Four times a day (QID) | ORAL | Status: DC | PRN

## 2022-06-06 MED ORDER — TRAMADOL HCL 50 MG PO TABS: 50.0000 mg | ORAL_TABLET | Freq: Three times a day (TID) | ORAL | 0 refills | Status: DC | PRN

## 2022-06-06 NOTE — Discharge Summary (Signed)
Physician Discharge Summary   Patient: Kaylee Reyes MRN: [id number] DOB: Jul 03, 1951  Admit date:     06/04/2022  Discharge date: 06/06/22  Discharge Physician: Fritzi Mandes   PCP: Birdie Sons, MD   Recommendations at discharge:   F/u PCP in 7-10 days  Discharge Diagnoses: Principal Problem:   Acute pancreatitis Active Problems:   Hypertension   Hypothyroidism   GERD (gastroesophageal reflux disease)   History of breast cancer  Hospital Course: Kaylee Reyes is a 71 y.o. Caucasian female with medical history significant for GERD, hypertension, dyslipidemia, hypothyroidism and  PUD, who presented to the ER of epigastric and left upper quadrant abdominal pain that started earlier during the day with associated nausea without vomiting.  Abdominal and pelvic CT scan revealed mild fat stranding and edema around the uncinate process of the pancreas consistent with acute pancreatitis with no evidence of pancreatic necrosis or acute peripancreatic collection.  It showed hepatomegaly and hepatic steatosis with no biliary obstruction.  Also showed mild splenomegaly and colonic diverticulosis without diverticulitis.   Assessment and Plan: * Acute pancreatitis - received IVF. Tolerating FLD - Pain meds prn -d/c HCTZ that could be the culprit for her pancreatitis.  She has no history of alcohol abuse and she is status post cholecystectomy with no evidence for missed stones. -Lipase trending down to 98.  Hypertension -  continue atenolol --d/c HCTZ.  Hypothyroidism - continue Synthroid.  GERD (gastroesophageal reflux disease) on PPI therapy.  History of breast cancer - We will continue letrozole.         Disposition: Home Diet recommendation:  Discharge Diet Orders (From admission, onward)     Start     Ordered   06/06/22 0000  Diet - low sodium heart healthy        06/06/22 0826           Cardiac diet DISCHARGE MEDICATION: Allergies as of 06/06/2022        Reactions   Penicillins Shortness Of Breath   Has patient had a PCN reaction causing immediate rash, facial/tongue/throat swelling, SOB or lightheadedness with hypotension: Yes Has patient had a PCN reaction causing severe rash involving mucus membranes or skin necrosis: No Has patient had a PCN reaction that required hospitalization No Has patient had a PCN reaction occurring within the last 10 years: No If all of the above answers are "NO", then may proceed with Cephalosporin use.   Nickel Hives   Keflex [cephalexin] Swelling   Throat swelling   Pravastatin Sodium Other (See Comments)   Myalgia        Medication List     STOP taking these medications    hydrochlorothiazide 25 MG tablet Commonly known as: HYDRODIURIL   letrozole 2.5 MG tablet Commonly known as: FEMARA       TAKE these medications    atenolol 50 MG tablet Commonly known as: TENORMIN TAKE 1 TABLET DAILY   atenolol 25 MG tablet Commonly known as: TENORMIN Take 1 tablet (25 mg total) by mouth in the morning. Take in addition to '50mg'$  in the morning.   CALCIUM CITRATE PO Take 1 tablet by mouth daily.   docusate sodium 100 MG capsule Commonly known as: COLACE Take 200 mg by mouth daily.   ELDERBERRY PO Take by mouth daily.   lansoprazole 30 MG capsule Commonly known as: PREVACID TAKE 1 CAPSULE BY MOUTH DAILY AT NOON(12:00 PM)   levothyroxine 75 MCG tablet Commonly known as: SYNTHROID TAKE 1 TABLET DAILY  Magnesium 400 MG Tabs Take 600 mg by mouth daily.   meclizine 12.5 MG tablet Commonly known as: ANTIVERT TAKE 1 TABLET(12.5 MG) BY MOUTH THREE TIMES DAILY AS NEEDED FOR DIZZINESS   REFRESH OPTIVE MEGA-3 OP Place 1 drop into both eyes daily.   SUPER B COMPLEX PO Take 1 tablet by mouth daily.   traMADol 50 MG tablet Commonly known as: ULTRAM Take 1 tablet (50 mg total) by mouth every 8 (eight) hours as needed for moderate pain or severe pain.   triamcinolone 0.025 % cream Commonly  known as: KENALOG Apply 1 Application topically 2 (two) times daily.   Turmeric Curcumin Caps Take 1 capsule by mouth daily.   Vitamin D (Ergocalciferol) 1.25 MG (50000 UNIT) Caps capsule Commonly known as: DRISDOL TAKE 1 CAPSULE BY MOUTH 1 TIME A WEEK   vitamin E 180 MG (400 UNITS) capsule Take by mouth.        Follow-up Information     Birdie Sons, MD. Schedule an appointment as soon as possible for a visit in 1 week(s).   Specialty: Family Medicine Why: in 7-10 days Contact information: [address] [place] [address] [phone]                Discharge Exam: Danley Danker Weights   06/04/22 1844  Weight: 131.5 kg     Condition at discharge: fair  The results of significant diagnostics from this hospitalization (including imaging, microbiology, ancillary and laboratory) are listed below for reference.   Imaging Studies: CT ABDOMEN PELVIS W CONTRAST  Result Date: 06/04/2022 CLINICAL DATA:  Acute pancreatitis, evaluate complications or hepatobiliary obstruction Abdominal pain radiating to the back. EXAM: CT ABDOMEN AND PELVIS WITH CONTRAST TECHNIQUE: Multidetector CT imaging of the abdomen and pelvis was performed using the standard protocol following bolus administration of intravenous contrast. RADIATION DOSE REDUCTION: This exam was performed according to the departmental dose-optimization program which includes automated exposure control, adjustment of the mA and/or kV according to patient size and/or use of iterative reconstruction technique. CONTRAST:  135m OMNIPAQUE IOHEXOL 300 MG/ML  SOLN COMPARISON:  Abdominal ultrasound 05/18/2021, remote PET-CT 10/23/2015 FINDINGS: Lower chest: Subsegmental linear atelectasis in the left lower lobe. No pleural effusion. Hepatobiliary: Diffusely decreased hepatic density consistent with steatosis. Liver is enlarged spanning 25 cm cranial caudal. No focal hepatic lesion. No capsular nodularity.  Cholecystectomy. Normal common bile duct. No intrahepatic biliary ductal dilatation. Pancreas: Mild fat stranding and edema about the uncinate process. No evidence of pancreatic necrosis or acute peripancreatic collection. No ductal dilatation. No visualized pancreatic mass. Spleen: Enlarged spanning 14.2 cm AP.  No focal abnormality. Adrenals/Urinary Tract: Normal adrenal glands. No hydronephrosis or perinephric edema. Homogeneous renal enhancement with symmetric excretion on delayed phase imaging. No renal calculi. Small exophytic cyst from the anterior lower kidney, needing no further follow-up. Urinary bladder is only minimally distended, normal for degree of distension. Stomach/Bowel: Small hiatal hernia. Ingested material within the stomach. There is mild edema about the third and fourth portion of the duodenum, likely related to primary pancreatic inflammation rather than duodenal inflammation. The small bowel is otherwise unremarkable. No small bowel obstruction. Medial deviation of the cecum in the central abdomen. Normal air-filled appendix in the mid abdomen. There is no mesenteric swirling or twisting. Mild multifocal colonic diverticulosis. No diverticulitis or acute colonic inflammation the sigmoid colon is redundant. Vascular/Lymphatic: Aortic atherosclerosis. No aortic aneurysm. Patent portal vein. Patent splenic and mesenteric veins. No abdominopelvic adenopathy. Reproductive: Hysterectomy. Stable appearance of the  vaginal cuff from prior PET. The left ovary remains prominent size spanning 2.6 cm, however this is unchanged from 2016 CT and considered benign. No adnexal mass. Other: Small fat containing umbilical hernia. No free air or ascites. Musculoskeletal: Diffuse degenerative change in the lumbar spine with multilevel degenerative disc disease and facet hypertrophy. Bilateral hip osteoarthritis. There are no acute or suspicious osseous abnormalities. IMPRESSION: 1. Mild fat stranding and edema  about the uncinate process of the pancreas consistent with acute pancreatitis. No evidence of pancreatic necrosis or acute peripancreatic collection. 2. Hepatomegaly and hepatic steatosis.  No biliary obstruction. 3. Mild splenomegaly. 4. Colonic diverticulosis without diverticulitis. Aortic Atherosclerosis (ICD10-I70.0). Electronically Signed   By: Keith Rake M.D.   On: 06/04/2022 22:51    Microbiology: Results for orders placed or performed in visit on 09/15/21  Urine Culture     Status: None   Collection Time: 09/17/21  8:40 AM   Specimen: Urine   UR  Result Value Ref Range Status   Urine Culture, Routine Final report  Final   Organism ID, Bacteria Comment  Final    Comment: Mixed urogenital flora 10,000-25,000 colony forming units per mL     Labs: CBC: Recent Labs  Lab 06/04/22 1852 06/05/22 0715  WBC 10.4 8.1  HGB 11.9* 10.6*  HCT 38.9 33.8*  MCV 87.2 87.6  PLT 222 109   Basic Metabolic Panel: Recent Labs  Lab 06/04/22 1852 06/05/22 0715  NA 139 138  K 3.9 3.7  CL 101 104  CO2 27 28  GLUCOSE 139* 123*  BUN 15 13  CREATININE 0.89 0.78  CALCIUM 9.3 9.0   Liver Function Tests: Recent Labs  Lab 06/04/22 1852  AST 20  ALT 18  ALKPHOS 112  BILITOT 0.7  PROT 8.1  ALBUMIN 4.2   CBG: No results for input(s): "GLUCAP" in the last 168 hours.  Discharge time spent: greater than 30 minutes.  Signed: Fritzi Mandes, MD [facility] 06/06/2022

## 2022-06-06 NOTE — Plan of Care (Signed)

## 2022-06-06 NOTE — Discharge Instructions (Signed)
Liquid diet for 3-4 days

## 2022-06-06 NOTE — Plan of Care (Signed)
  Problem: Pain Managment: Goal: General experience of comfort will improve Outcome: Progressing   Problem: Safety: Goal: Ability to remain free from injury will improve Outcome: Progressing   

## 2022-06-06 NOTE — Progress Notes (Signed)
IV discontinue, discharge paperwork explain and given to patient and family.  Pt. States no complains or concerns at this time.

## 2022-06-06 NOTE — Hospital Course (Signed)
Kaylee Reyes is a 71 y.o. Caucasian female with medical history significant for GERD, hypertension, dyslipidemia, hypothyroidism and  PUD, who presented to the ER of epigastric and left upper quadrant abdominal pain that started earlier during the day with associated nausea without vomiting.  Abdominal and pelvic CT scan revealed mild fat stranding and edema around the uncinate process of the pancreas consistent with acute pancreatitis with no evidence of pancreatic necrosis or acute peripancreatic collection.  It showed hepatomegaly and hepatic steatosis with no biliary obstruction.  Also showed mild splenomegaly and colonic diverticulosis without diverticulitis.

## 2022-06-06 NOTE — Progress Notes (Signed)
  Transition of Care [facility]) Screening Note   Patient Details  Name: BRIEL GALLICCHIO Date of Birth: Jan 30, 1951   Transition of Care [facility]) CM/SW Contact:    Alberteen Sam, LCSW Phone Number: 06/06/2022, 9:10 AM  Spoke with patient prior to dc, she reports being independent with no needs.   Transition of Care Department [facility]) has reviewed patient and no TOC needs have been identified at this time. We will continue to monitor patient advancement through interdisciplinary progression rounds. If new patient transition needs arise, please place a TOC consult.  [place], [place]

## 2022-06-11 ENCOUNTER — Ambulatory Visit: Payer: Self-pay | Admitting: *Deleted

## 2022-06-11 MED ORDER — ONDANSETRON HCL 4 MG PO TABS: 4.0000 mg | ORAL_TABLET | Freq: Three times a day (TID) | ORAL | 0 refills | Status: DC | PRN

## 2022-06-11 NOTE — Telephone Encounter (Signed)
Have sent prescription for zofran to [facility]

## 2022-06-11 NOTE — Telephone Encounter (Signed)
Patient advised.

## 2022-06-11 NOTE — Telephone Encounter (Signed)
Summary: nausea and stronger RX   Pt was sent home from the hospital with tramadol and pt stated she also stated she takes meclizine (ANTIVERT) 12.5 MG tablet for nausea but she asked for something stronger and stated it has not been helping with the nausea / she stated it has always helped in the past / please advise       Chief Complaint: requesting medication for nausea Symptoms: nausea since leaving hospital 06/04/22. Reports she has been taking antivert for nausea on other occasions and not working now. Frequency: since after 06/04/22 Pertinent Negatives: Patient denies fever, no vomiting Disposition: '[]'$ ED /'[]'$ Urgent Care (no appt availability in office) / '[]'$ Appointment(In office/virtual)/ '[]'$  [facility]/ '[]'$ Home Care/ '[]'$ Refused Recommended Disposition /'[]'$ [facility]/ '[x]'$  Follow-up with PCP Additional Notes:   Requesting medication for nausea.     Reason for Disposition  Nausea lasts > 1 week  Answer Assessment - Initial Assessment Questions 1. NAUSEA SEVERITY: "How bad is the nausea?" (e.g., mild, moderate, severe; dehydration, weight loss)   - MILD: loss of appetite without change in eating habits   - MODERATE: decreased oral intake without significant weight loss, dehydration, or malnutrition   - SEVERE: inadequate caloric or fluid intake, significant weight loss, symptoms of dehydration     Nausea from drinking water, difficulty tolerating broth 2. ONSET: "When did the nausea begin?"     While in hospital and after leaving hospital 06/04/22 3. VOMITING: "Any vomiting?" If Yes, ask: "How many times today?"     no 4. RECURRENT SYMPTOM: "Have you had nausea before?" If Yes, ask: "When was the last time?" "What happened that time?"     Yes while in hospital 06/04/22 5. CAUSE: "What do you think is causing the nausea?"     Not sure , acute pancreatitis  6. PREGNANCY: "Is there any chance you are pregnant?" (e.g., unprotected intercourse, missed birth control  pill, broken condom)     na  Protocols used: Nausea-A-AH

## 2022-06-15 ENCOUNTER — Ambulatory Visit (INDEPENDENT_AMBULATORY_CARE_PROVIDER_SITE_OTHER): Payer: Medicare Other | Admitting: Physician Assistant

## 2022-06-15 ENCOUNTER — Encounter: Payer: Self-pay | Admitting: Physician Assistant

## 2022-06-15 VITALS — BP 150/78 | HR 60 | Ht 66.0 in | Wt 280.8 lb

## 2022-06-15 DIAGNOSIS — D649 Anemia, unspecified: Secondary | ICD-10-CM | POA: Diagnosis not present

## 2022-06-15 DIAGNOSIS — K858 Other acute pancreatitis without necrosis or infection: Secondary | ICD-10-CM | POA: Diagnosis not present

## 2022-06-15 DIAGNOSIS — I10 Essential (primary) hypertension: Secondary | ICD-10-CM

## 2022-06-15 MED ORDER — ONDANSETRON HCL 4 MG PO TABS: 4.0000 mg | ORAL_TABLET | Freq: Three times a day (TID) | ORAL | 0 refills | Status: DC | PRN

## 2022-06-15 NOTE — Assessment & Plan Note (Signed)
In hospital  Will recheck cbc, iron, folate, vit b12

## 2022-06-15 NOTE — Patient Instructions (Signed)
Low-Sodium Eating Plan Sodium, which is an element that makes up salt, helps you maintain a healthy balance of fluids in your body. Too much sodium can increase your blood pressure and cause fluid and waste to be held in your body. Your health care provider or dietitian may recommend following this plan if you have high blood pressure (hypertension), kidney disease, liver disease, or heart failure. Eating less sodium can help lower your blood pressure, reduce swelling, and protect your heart, liver, and kidneys. What are tips for following this plan? Reading food labels The Nutrition Facts label lists the amount of sodium in one serving of the food. If you eat more than one serving, you must multiply the listed amount of sodium by the number of servings. Choose foods with less than 140 mg of sodium per serving. Avoid foods with 300 mg of sodium or more per serving. Shopping  Look for lower-sodium products, often labeled as "low-sodium" or "no salt added." Always check the sodium content, even if foods are labeled as "unsalted" or "no salt added." Buy fresh foods. Avoid canned foods and pre-made or frozen meals. Avoid canned, cured, or processed meats. Buy breads that have less than 80 mg of sodium per slice. Cooking  Eat more home-cooked food and less restaurant, buffet, and fast food. Avoid adding salt when cooking. Use salt-free seasonings or herbs instead of table salt or sea salt. Check with your health care provider or pharmacist before using salt substitutes. Cook with plant-based oils, such as canola, sunflower, or olive oil. Meal planning When eating at a restaurant, ask that your food be prepared with less salt or no salt, if possible. Avoid dishes labeled as brined, pickled, cured, smoked, or made with soy sauce, miso, or teriyaki sauce. Avoid foods that contain MSG (monosodium glutamate). MSG is sometimes added to Chinese food, bouillon, and some canned foods. Make meals that can  be grilled, baked, poached, roasted, or steamed. These are generally made with less sodium. General information Most people on this plan should limit their sodium intake to 1,500-2,000 mg (milligrams) of sodium each day. What foods should I eat? Fruits Fresh, frozen, or canned fruit. Fruit juice. Vegetables Fresh or frozen vegetables. "No salt added" canned vegetables. "No salt added" tomato sauce and paste. Low-sodium or reduced-sodium tomato and vegetable juice. Grains Low-sodium cereals, including oats, puffed wheat and rice, and shredded wheat. Low-sodium crackers. Unsalted rice. Unsalted pasta. Low-sodium bread. Whole-grain breads and whole-grain pasta. Meats and other proteins Fresh or frozen (no salt added) meat, poultry, seafood, and fish. Low-sodium canned tuna and salmon. Unsalted nuts. Dried peas, beans, and lentils without added salt. Unsalted canned beans. Eggs. Unsalted nut butters. Dairy Milk. Soy milk. Cheese that is naturally low in sodium, such as ricotta cheese, fresh mozzarella, or Swiss cheese. Low-sodium or reduced-sodium cheese. Cream cheese. Yogurt. Seasonings and condiments Fresh and dried herbs and spices. Salt-free seasonings. Low-sodium mustard and ketchup. Sodium-free salad dressing. Sodium-free light mayonnaise. Fresh or refrigerated horseradish. Lemon juice. Vinegar. Other foods Homemade, reduced-sodium, or low-sodium soups. Unsalted popcorn and pretzels. Low-salt or salt-free chips. The items listed above may not be a complete list of foods and beverages you can eat. Contact a dietitian for more information. What foods should I avoid? Vegetables Sauerkraut, pickled vegetables, and relishes. Olives. French fries. Onion rings. Regular canned vegetables (not low-sodium or reduced-sodium). Regular canned tomato sauce and paste (not low-sodium or reduced-sodium). Regular tomato and vegetable juice (not low-sodium or reduced-sodium). Frozen vegetables in  sauces. Grains   Instant hot cereals. Bread stuffing, pancake, and biscuit mixes. Croutons. Seasoned rice or pasta mixes. Noodle soup cups. Boxed or frozen macaroni and cheese. Regular salted crackers. Self-rising flour. Meats and other proteins Meat or fish that is salted, canned, smoked, spiced, or pickled. Precooked or cured meat, such as sausages or meat loaves. Bacon. Ham. Pepperoni. Hot dogs. Corned beef. Chipped beef. Salt pork. Jerky. Pickled herring. Anchovies and sardines. Regular canned tuna. Salted nuts. Dairy Processed cheese and cheese spreads. Hard cheeses. Cheese curds. Blue cheese. Feta cheese. String cheese. Regular cottage cheese. Buttermilk. Canned milk. Fats and oils Salted butter. Regular margarine. Ghee. Bacon fat. Seasonings and condiments Onion salt, garlic salt, seasoned salt, table salt, and sea salt. Canned and packaged gravies. Worcestershire sauce. Tartar sauce. Barbecue sauce. Teriyaki sauce. Soy sauce, including reduced-sodium. Steak sauce. Fish sauce. Oyster sauce. Cocktail sauce. Horseradish that you find on the shelf. Regular ketchup and mustard. Meat flavorings and tenderizers. Bouillon cubes. Hot sauce. Pre-made or packaged marinades. Pre-made or packaged taco seasonings. Relishes. Regular salad dressings. Salsa. Other foods Salted popcorn and pretzels. Corn chips and puffs. Potato and tortilla chips. Canned or dried soups. Pizza. Frozen entrees and pot pies. The items listed above may not be a complete list of foods and beverages you should avoid. Contact a dietitian for more information. Summary Eating less sodium can help lower your blood pressure, reduce swelling, and protect your heart, liver, and kidneys. Most people on this plan should limit their sodium intake to 1,500-2,000 mg (milligrams) of sodium each day. Canned, boxed, and frozen foods are high in sodium. Restaurant foods, fast foods, and pizza are also very high in sodium. You also get sodium by  adding salt to food. Try to cook at home, eat more fresh fruits and vegetables, and eat less fast food and canned, processed, or prepared foods. This information is not intended to replace advice given to you by your health care provider. Make sure you discuss any questions you have with your health care provider. Document Revised: 12/28/2019 Document Reviewed: 10/24/2019 Elsevier Patient Education  2023 [employer]  

## 2022-06-15 NOTE — Progress Notes (Signed)
I,Sha'taria Tyson,acting as a Education administrator for [employer], PA-C.,have documented all relevant documentation on the behalf of Mikey Kirschner, PA-C,as directed by  Mikey Kirschner, PA-C while in the presence of Mikey Kirschner, PA-C.   Established patient visit   Patient: Kaylee Reyes   DOB: Oct 23, 1951   71 y.o. Female  MRN: [id number] Visit Date: 06/15/2022  Today's healthcare provider: Mikey Kirschner, PA-C   Cc. Hospital f/u  Subjective    HPI  Follow up Hospitalization  Patient was admitted to [facility] on 06/04/22 and discharged on 06/07/22. She was treated for acute pancreatitis thought to be secondary to HCTZ Treatment for this included  - received IVF.  -d/c HCTZ that could be the culprit for her pancreatitis.  She has no history of alcohol abuse and she is status post cholecystectomy with no evidence for missed stones. -Lipase trending down to 98. -Low sodium heart healthy diet and d/c hctz 25 mg tablet  Telephone follow up was done on n/a She reports good compliance with treatment. She reports this condition is  better but not 100% and not sure what she should start eating . some nausea still on an all liquid diet ----------------------------------------------------------------------------------------- -   Medications: Outpatient Medications Prior to Visit  Medication Sig   atenolol (TENORMIN) 25 MG tablet Take 1 tablet (25 mg total) by mouth in the morning. Take in addition to '50mg'$  in the morning.   atenolol (TENORMIN) 50 MG tablet TAKE 1 TABLET DAILY   B Complex-C (SUPER B COMPLEX PO) Take 1 tablet by mouth daily.   CALCIUM CITRATE PO Take 1 tablet by mouth daily.   Carboxymeth-Glycerin-Polysorb (REFRESH OPTIVE MEGA-3 OP) Place 1 drop into both eyes daily.   docusate sodium (COLACE) 100 MG capsule Take 200 mg by mouth daily.    ELDERBERRY PO Take by mouth daily.    lansoprazole (PREVACID) 30 MG capsule TAKE 1 CAPSULE BY MOUTH DAILY AT NOON(12:00 PM)   levothyroxine  (SYNTHROID) 75 MCG tablet TAKE 1 TABLET DAILY   Magnesium 400 MG TABS Take 600 mg by mouth daily.    meclizine (ANTIVERT) 12.5 MG tablet TAKE 1 TABLET(12.5 MG) BY MOUTH THREE TIMES DAILY AS NEEDED FOR DIZZINESS   Misc Natural Products (TURMERIC CURCUMIN) CAPS Take 1 capsule by mouth daily.   traMADol (ULTRAM) 50 MG tablet Take 1 tablet (50 mg total) by mouth every 8 (eight) hours as needed for moderate pain or severe pain.   triamcinolone (KENALOG) 0.025 % cream Apply 1 Application topically 2 (two) times daily.   Vitamin D, Ergocalciferol, (DRISDOL) 1.25 MG (50000 UNIT) CAPS capsule TAKE 1 CAPSULE BY MOUTH 1 TIME A WEEK   vitamin E 180 MG (400 UNITS) capsule Take by mouth.   [DISCONTINUED] ondansetron (ZOFRAN) 4 MG tablet Take 1 tablet (4 mg total) by mouth every 8 (eight) hours as needed for nausea or vomiting.   No facility-administered medications prior to visit.    Review of Systems  Constitutional:  Negative for fatigue and fever.  Respiratory:  Negative for cough and shortness of breath.   Cardiovascular:  Negative for chest pain and leg swelling.  Gastrointestinal:  Positive for nausea. Negative for abdominal pain.  Neurological:  Negative for dizziness and headaches.      Objective    Blood pressure (!) 150/78, pulse 60, height '5\' 6"'$  (1.676 m), weight 280 lb 12.8 oz (127.4 kg), SpO2 98 %.   Physical Exam Constitutional:      General: She is awake.  Appearance: She is well-developed.  HENT:     Head: Normocephalic.  Eyes:     Conjunctiva/sclera: Conjunctivae normal.  Cardiovascular:     Rate and Rhythm: Normal rate and regular rhythm.     Heart sounds: Normal heart sounds.  Pulmonary:     Effort: Pulmonary effort is normal.     Breath sounds: Normal breath sounds.  Skin:    General: Skin is warm.  Neurological:     Mental Status: She is alert and oriented to person, place, and time.  Psychiatric:        Attention and Perception: Attention normal.        Mood  and Affect: Mood normal.        Speech: Speech normal.        Behavior: Behavior is cooperative.      No results found for any visits on 06/15/22.  Assessment & Plan     Problem List Items Addressed This Visit       Cardiovascular and Mediastinum   Hypertension    Continue atenolol. HCTZ was d/c Pressure elevated at visit, ref back to cardiology for management. Ordered cmp      Relevant Orders   Ambulatory referral to Cardiology     Digestive   Acute pancreatitis - Primary    Reviewed discharge summary Cautious introduction of solid foods, instructed on appropriate diet.  Will check lipase to ensure continuing to trend down Increase fluids      Relevant Medications   ondansetron (ZOFRAN) 4 MG tablet   Other Relevant Orders   Lipase   Comprehensive Metabolic Panel (CMET)     Other   Anemia    In hospital  Will recheck cbc, iron, folate, vit b12      Relevant Orders   Folate   Iron, TIBC and Ferritin Panel   Vitamin B12     Return in about 4 months (around 10/16/2022) for chronic conditions.      I, Mikey Kirschner, PA-C have reviewed all documentation for this visit. The documentation on  06/15/2022  for the exam, diagnosis, procedures, and orders are all accurate and complete.  Mikey Kirschner, PA-C [facility] [address] [place], [place], [address] Office: [phone] Fax: [place]

## 2022-06-15 NOTE — Assessment & Plan Note (Addendum)
Reviewed discharge summary Cautious introduction of solid foods, instructed on appropriate diet.  Will check lipase to ensure continuing to trend down Increase fluids

## 2022-06-15 NOTE — Assessment & Plan Note (Signed)
>>  ASSESSMENT AND PLAN FOR ESSENTIAL HYPERTENSION WRITTEN ON 06/15/2022  2:52 PM BY DRUBEL, LINDSAY, PA-C  Continue atenolol . HCTZ was d/c Pressure elevated at visit, ref back to cardiology for management. Ordered cmp

## 2022-06-15 NOTE — Assessment & Plan Note (Signed)
Continue atenolol. HCTZ was d/c Pressure elevated at visit, ref back to cardiology for management. Ordered cmp

## 2022-06-16 ENCOUNTER — Other Ambulatory Visit: Payer: Self-pay | Admitting: Physician Assistant

## 2022-06-16 ENCOUNTER — Telehealth: Payer: Self-pay | Admitting: Family Medicine

## 2022-06-16 DIAGNOSIS — K858 Other acute pancreatitis without necrosis or infection: Secondary | ICD-10-CM

## 2022-06-16 LAB — COMPREHENSIVE METABOLIC PANEL
ALT: 16 IU/L (ref 0–32)
AST: 22 IU/L (ref 0–40)
Albumin/Globulin Ratio: 1.5 (ref 1.2–2.2)
Albumin: 4.5 g/dL (ref 3.9–4.9)
Alkaline Phosphatase: 123 IU/L — ABNORMAL HIGH (ref 44–121)
BUN/Creatinine Ratio: 6 — ABNORMAL LOW (ref 12–28)
BUN: 7 mg/dL — ABNORMAL LOW (ref 8–27)
Bilirubin Total: 0.5 mg/dL (ref 0.0–1.2)
CO2: 23 mmol/L (ref 20–29)
Calcium: 9.7 mg/dL (ref 8.7–10.3)
Chloride: 101 mmol/L (ref 96–106)
Creatinine, Ser: 1.19 mg/dL — ABNORMAL HIGH (ref 0.57–1.00)
Globulin, Total: 3.1 g/dL (ref 1.5–4.5)
Glucose: 94 mg/dL (ref 70–99)
Potassium: 4.7 mmol/L (ref 3.5–5.2)
Sodium: 140 mmol/L (ref 134–144)
Total Protein: 7.6 g/dL (ref 6.0–8.5)
eGFR: 49 mL/min/{1.73_m2} — ABNORMAL LOW (ref >59)

## 2022-06-16 LAB — IRON,TIBC AND FERRITIN PANEL
Ferritin: 136 ng/mL (ref 15–150)
Iron Saturation: 19 % (ref 15–55)
Iron: 59 ug/dL (ref 27–139)
Total Iron Binding Capacity: 309 ug/dL (ref 250–450)
UIBC: 250 ug/dL (ref 118–369)

## 2022-06-16 LAB — VITAMIN B12: Vitamin B-12: >2000 pg/mL — ABNORMAL HIGH (ref 232–1245)

## 2022-06-16 LAB — FOLATE: Folate: 5 ng/mL (ref >3.0)

## 2022-06-16 LAB — LIPASE: Lipase: 99 U/L — ABNORMAL HIGH (ref 14–72)

## 2022-06-16 NOTE — Telephone Encounter (Signed)
Patient called in on status of lab results. I let her know hasn't been released to [place] yet.

## 2022-06-16 NOTE — Addendum Note (Signed)
Addended byMikey Kirschner on: 06/16/2022 08:55 AM   Modules accepted: Orders

## 2022-06-16 NOTE — Telephone Encounter (Signed)
Copied from [place] [id number]. Topic: General - Other >> Jun 16, 2022  9:26 AM Burman Freestone wrote: Reason for CRM: Pt would like a call back to discuss labs. She is wanting to know if she drinks flavored water will it make her kidney functions worse.Regular water makes her nauseated She also would like to know if she needs to quit taking B12 since her levels are high

## 2022-06-17 NOTE — Addendum Note (Signed)
Addended by: Barnie Mort on: 06/17/2022 02:28 PM   Modules accepted: Orders

## 2022-06-23 DIAGNOSIS — K858 Other acute pancreatitis without necrosis or infection: Secondary | ICD-10-CM | POA: Diagnosis not present

## 2022-06-24 ENCOUNTER — Encounter: Payer: Self-pay | Admitting: Physician Assistant

## 2022-06-24 ENCOUNTER — Ambulatory Visit (INDEPENDENT_AMBULATORY_CARE_PROVIDER_SITE_OTHER): Payer: Medicare Other | Admitting: Physician Assistant

## 2022-06-24 ENCOUNTER — Telehealth: Payer: Self-pay

## 2022-06-24 VITALS — BP 142/82 | HR 66 | Ht 66.0 in | Wt 270.2 lb

## 2022-06-24 DIAGNOSIS — M7989 Other specified soft tissue disorders: Secondary | ICD-10-CM

## 2022-06-24 DIAGNOSIS — I10 Essential (primary) hypertension: Secondary | ICD-10-CM | POA: Diagnosis not present

## 2022-06-24 LAB — LIPASE: Lipase: 143 U/L — ABNORMAL HIGH (ref 14–72)

## 2022-06-24 NOTE — Telephone Encounter (Signed)
Booked for 8:20 tomorrow

## 2022-06-24 NOTE — Progress Notes (Signed)
Cardiology Office Note    Date:  06/24/2022   ID:  [name], DOB 1951/10/13, MRN [id number]  PCP:  Birdie Sons, MD  Cardiologist:  Ida Rogue, MD  Electrophysiologist:  None   Chief Complaint: Follow-up blood pressure  History of Present Illness:   Kaylee Reyes is a 71 y.o. female with history of HTN, HLD, pancreatitis, breast cancer status post mastectomy and chemotherapy, neuropathy, anemia, obesity, and GERD who presents for evaluation of hypertension at the request of her PCP.  She was previously seen by Dr. Saralyn Pilar with [facility] cardiology for hypertension and shortness of breath.  Echo in 03/2021 demonstrated an EF of 55%, normal wall motion, mildly dilated left atrium, normal RV systolic function, and trivial mitral regurgitation.  She was seen by Dr. Rockey Situ in 07/2021 for evaluation of hypertension and lower extremity swelling.  At that time, she noted some elevated systolic with low diastolic pressures.  It was recommended she move atenolol to the evening and continue HCTZ in the morning in an effort to minimize medications at her request.  With regards to her hyperlipidemia and minimal aortic atherosclerosis noted on CT imaging, she was noted to have had dramatic weight loss with lifestyle modification recommended.  Lower extremity swelling was felt to be dependent in etiology and exacerbated by weight is insufficiency.  As needed follow-up was recommended.  She was admitted to the hospital from 6/30 through 7/2 with acute pancreatitis.  High-sensitivity troponin negative x2.  EKG nonacute.  HCTZ was discontinued.  With regards to her history of hypertension, she was continued on atenolol.    She comes in today for further evaluation of her hypertension in the context of HCTZ discontinuation, and is doing well from a cardiac perspective.  She is without symptoms of angina or decompensation.  No dizziness, presyncope, syncope.  She notes her blood pressure at home,  even while on HCTZ, would run in the 242A to 834H systolic largely with occasional readings in the 962I systolic.  She notes her diastolic blood pressure is frequently in the 60s to 70s.  She reports having previously not monitored her sodium intake closely.  She also notes leading up to her admission with pancreatitis, she had a meal consisting of burgers, fries, and pastries.  She has since significantly improved her diet and is very closely monitoring her sodium intake.  More recently, she has been on a clear liquid diet.  She has not noticed any significant change in her lower extremity swelling while off HCTZ.  She remains on atenolol.   Labs independently reviewed: 06/2022 - BUN 7, serum creatinine 1.19, potassium 4.7, albumin 4.5, AST/ALT normal, Hgb 10.6, PLT 165 09/2021 - TSH normal, TC 266, TG 247, HDL 41, LDL 178 03/2021 - A1c 5.2  Past Medical History:  Diagnosis Date   Anemia    Body tinea 10/01/2015   Previously treated by Dr. Koleen Nimrod    COVID-19 02/23/2021   MAB infusion 01/23/2021   GERD (gastroesophageal reflux disease)    History of breast cancer 09/2015   breast cancer   History of chicken pox    History of peptic ulcer disease 09/25/2015   Hyperlipidemia    Hypertension    Hypothyroidism    Neuropathy of foot    bilateral   Pinched nerve    PONV (postoperative nausea and vomiting)    exploratory surgery at [facility]   Thyroid disease    Tuberculosis exposure    Mother had TB.  Pt  shows (+) on tests, but has never had.   Vertigo    last episode over 1 yr ago   Wears dentures    partial upper and lower    Past Surgical History:  Procedure Laterality Date   ABDOMINAL HYSTERECTOMY  07/13/2013   Total. with BSO for postmenopausal bleeding and fibroids with large cervical polyp. Dr. Levy Sjogren and Dr. Glennon Mac at [facility]  04/17/2011   Hepatomegaly with borderline slenomegaly. Suggestive of fatty liver. s/p cholecystectomy. Portions of aorta obscured    BREAST SURGERY Right 2011   BREAST BIOPSY   CESAREAN SECTION  1988   CHOLECYSTECTOMY  1990   COLONOSCOPY WITH PROPOFOL N/A 09/01/2017   Procedure: COLONOSCOPY WITH PROPOFOL;  Surgeon: [name], MD;  Location: [facility];  Service: Gastroenterology;  Laterality: N/A;   DIAGNOSTIC LAPAROSCOPY     ESOPHAGOGASTRODUODENOSCOPY (EGD) WITH PROPOFOL N/A 01/17/2020   Procedure: ESOPHAGOGASTRODUODENOSCOPY (EGD) WITH PROPOFOL;  Surgeon: [name], MD;  Location: [facility];  Service: Endoscopy;  Laterality: N/A;   EXPLORATORY LAPAROTOMY     Left breast   MASTECTOMY     MASTECTOMY W/ SENTINEL NODE BIOPSY Bilateral 10/28/2015   Procedure: MASTECTOMY WITH SENTINEL LYMPH NODE BIOPSY;  Surgeon: Hubbard Robinson, MD;  Location: [facility];  Service: General;  Laterality: Bilateral;   PORT-A-CATH REMOVAL Left 10/11/2016   Procedure: REMOVAL PORT-A-CATH;  Surgeon: Hubbard Robinson, MD;  Location: [facility];  Service: General;  Laterality: Left;   PORTACATH PLACEMENT Left 12/23/2015   Procedure: INSERTION PORT-A-CATH;  Surgeon: Hubbard Robinson, MD;  Location: [facility];  Service: General;  Laterality: Left;   WRIST SURGERY      Current Medications: Current Meds  Medication Sig   atenolol (TENORMIN) 25 MG tablet Take 1 tablet (25 mg total) by mouth in the morning. Take in addition to '50mg'$  in the morning.   atenolol (TENORMIN) 50 MG tablet TAKE 1 TABLET DAILY   B Complex-C (SUPER B COMPLEX PO) Take 1 tablet by mouth daily.   CALCIUM CITRATE PO Take 1 tablet by mouth daily.   Carboxymeth-Glycerin-Polysorb (REFRESH OPTIVE MEGA-3 OP) Place 1 drop into both eyes daily.   docusate sodium (COLACE) 100 MG capsule Take 200 mg by mouth daily.    ELDERBERRY PO Take by mouth daily.    lansoprazole (PREVACID) 30 MG capsule TAKE 1 CAPSULE BY MOUTH DAILY AT NOON(12:00 PM)   levothyroxine (SYNTHROID) 75 MCG tablet TAKE 1 TABLET DAILY   Magnesium 400 MG TABS Take 600 mg by mouth daily.    meclizine  (ANTIVERT) 12.5 MG tablet TAKE 1 TABLET(12.5 MG) BY MOUTH THREE TIMES DAILY AS NEEDED FOR DIZZINESS   Misc Natural Products (TURMERIC CURCUMIN) CAPS Take 1 capsule by mouth daily.   ondansetron (ZOFRAN) 4 MG tablet Take 1 tablet (4 mg total) by mouth every 8 (eight) hours as needed for nausea or vomiting.   triamcinolone (KENALOG) 0.025 % cream Apply 1 Application topically 2 (two) times daily.   Vitamin D, Ergocalciferol, (DRISDOL) 1.25 MG (50000 UNIT) CAPS capsule TAKE 1 CAPSULE BY MOUTH 1 TIME A WEEK   vitamin E 180 MG (400 UNITS) capsule Take by mouth.    Allergies:   Penicillins, Nickel, Keflex [cephalexin], and Pravastatin sodium   Social History   Socioeconomic History   Marital status: Married    Spouse name: Not on file   Number of children: 2   Years of education: Not on file   Highest education level: Some college, no  degree  Occupational History   Occupation: retired  Tobacco Use   Smoking status: Never   Smokeless tobacco: Never  Vaping Use   Vaping Use: Never used  Substance and Sexual Activity   Alcohol use: No    Alcohol/week: 0.0 standard drinks of alcohol   Drug use: No   Sexual activity: Never    Birth control/protection: Surgical  Other Topics Concern   Not on file  Social History Narrative   Not on file   Social Determinants of Health   Financial Resource Strain: Low Risk  (07/29/2020)   Overall Financial Resource Strain (CARDIA)    Difficulty of Paying Living Expenses: Not hard at all  Food Insecurity: No Food Insecurity (07/29/2020)   Hunger Vital Sign    Worried About Running Out of Food in the Last Year: Never true    [place] in the Last Year: Never true  Transportation Needs: No Transportation Needs (07/29/2020)   PRAPARE - Hydrologist (Medical): No    Lack of Transportation (Non-Medical): No  Physical Activity: Inactive (07/29/2020)   Exercise Vital Sign    Days of Exercise per Week: 0 days    Minutes of  Exercise per Session: 0 min  Stress: No Stress Concern Present (07/29/2020)   [place]    Feeling of Stress : Not at all  Social Connections: Moderately Integrated (07/29/2020)   Social Connection and Isolation Panel [NHANES]    Frequency of Communication with Friends and Family: More than three times a week    Frequency of Social Gatherings with Friends and Family: More than three times a week    Attends Religious Services: More than 4 times per year    Active Member of [employer] or Organizations: No    Attends Archivist Meetings: Never    Marital Status: Married     Family History:  The patient's family history includes Diabetes in her brother; Hypertension in her brother and mother; Thyroid disease in her mother. There is no history of Breast cancer, Ovarian cancer, or Colon cancer.  ROS:   12-point review of systems is negative unless otherwise noted in the HPI.   EKGs/Labs/Other Studies Reviewed:    Studies reviewed were summarized above. The additional studies were reviewed today:  2D echo 03/09/2021 [facility]): INTERPRETATION  NORMAL LEFT VENTRICULAR SYSTOLIC FUNCTION  NORMAL RIGHT VENTRICULAR SYSTOLIC FUNCTION  TRIVIAL REGURGITATION NOTED (mitral)  NO VALVULAR STENOSIS   EKG:  EKG is ordered today.  The EKG ordered today demonstrates NSR, 66 bpm, baseline artifact and wandering, no acute ST-T changes  Recent Labs: 09/17/2021: TSH 2.620 06/05/2022: Hemoglobin 10.6; Platelets 165 06/15/2022: ALT 16; BUN 7; Creatinine, Ser 1.19; Potassium 4.7; Sodium 140  Recent Lipid Panel    Component Value Date/Time   CHOL 266 (H) 09/17/2021 0826   TRIG 247 (H) 09/17/2021 0826   HDL 41 09/17/2021 0826   CHOLHDL 6.5 (H) 09/17/2021 0826   LDLCALC 178 (H) 09/17/2021 0826    PHYSICAL EXAM:    VS:  BP (!) 142/82 (BP Location: Left Arm, Patient Position: Sitting, Cuff Size: Large)   Pulse 66   Ht '5\' 6"'$  (1.676 m)    Wt 270 lb 3.2 oz (122.6 kg)   SpO2 97%   BMI 43.61 kg/m   BMI: Body mass index is 43.61 kg/m.  Physical Exam Vitals reviewed.  Constitutional:      Appearance: She is well-developed.  HENT:     Head: Normocephalic and atraumatic.  Eyes:     General:        Right eye: No discharge.        Left eye: No discharge.  Cardiovascular:     Rate and Rhythm: Normal rate and regular rhythm.     Heart sounds: Normal heart sounds, S1 normal and S2 normal. Heart sounds not distant. No midsystolic click and no opening snap. No murmur heard.    No friction rub.  Pulmonary:     Effort: Pulmonary effort is normal. No respiratory distress.     Breath sounds: Normal breath sounds. No decreased breath sounds, wheezing or rales.  Chest:     Chest wall: No tenderness.  Abdominal:     General: There is no distension.  Musculoskeletal:     Cervical back: Normal range of motion.  Skin:    General: Skin is warm and dry.     Nails: There is no clubbing.  Neurological:     Mental Status: She is alert and oriented to person, place, and time.  Psychiatric:        Speech: Speech normal.        Behavior: Behavior normal.        Thought Content: Thought content normal.        Judgment: Judgment normal.     Wt Readings from Last 3 Encounters:  06/24/22 270 lb 3.2 oz (122.6 kg)  06/15/22 280 lb 12.8 oz (127.4 kg)  06/04/22 290 lb (131.5 kg)     ASSESSMENT & PLAN:   HTN: Blood pressure readings have largely been stable with most systolic readings in the 119E to 140s.  She is working on monitoring her sodium intake, and with this hopefully will begin to see some lower blood pressures.  During her admission, it was felt HCTZ was contributing to her pancreatitis, leading it to be discontinued.  We discussed treatment options including escalation of atenolol, transition to alternative medication, or continue current therapy with close monitoring and have decided that she continue current dose of atenolol  with close monitoring and sodium restriction.  If blood pressure continues to run in the 140s to 150s, she may require dosage or medication adjustment, possibly with transition from atenolol to carvedilol.  HLD with hypertriglyceridemia: LDL 178 with a triglyceride of 174 (uncertain if this was fasting or not).  This was not discussed in detail today.  Follow-up with PCP as directed.  Obesity: Weight loss is encouraged through out of the diet and regular exercise.  Lower extremity swelling: Would not aggressively diurese.  Likely in the context of dependent edema and venous insufficiency.  Encourage leg elevation and compression socks.  Pancreatitis: Follow-up with PCP and GI   Disposition: F/u with Dr. Rockey Situ or an APP prn.   Medication Adjustments/Labs and Tests Ordered: Current medicines are reviewed at length with the patient today.  Concerns regarding medicines are outlined above. Medication changes, Labs and Tests ordered today are summarized above and listed in the Patient Instructions accessible in Encounters.   Signed, Christell Faith, PA-C 06/24/2022 3:55 PM     [address] [place], [place] [address] [phone]

## 2022-06-24 NOTE — Patient Instructions (Signed)
Medication Instructions:  No changes at this time.   *If you need a refill on your cardiac medications before your next appointment, please call your pharmacy*   Lab Work: None  If you have labs (blood work) drawn today and your tests are completely normal, you will receive your results only by: [place] (if you have MyChart) OR A paper copy in the mail If you have any lab test that is abnormal or we need to change your treatment, we will call you to review the results.   Testing/Procedures: None   Follow-Up: At [facility], you and your health needs are our priority.  As part of our continuing mission to provide you with exceptional heart care, we have created designated Provider Care Teams.  These Care Teams include your primary Cardiologist (physician) and Advanced Practice Providers (APPs -  Physician Assistants and Nurse Practitioners) who all work together to provide you with the care you need, when you need it.   Your next appointment:   Follow up as needed.     Important Information About Sugar

## 2022-06-24 NOTE — Telephone Encounter (Signed)
Copied from [place]. Topic: General - Other >> Jun 24, 2022  9:21 AM Chapman Fitch wrote: Reason for CRM: Pt would like to speak with the nurse about her results / she has a few questions/ please advise

## 2022-06-24 NOTE — Progress Notes (Signed)
Established patient visit  I,Joseline E Rosas,acting as a scribe for [employer], PA-C.,have documented all relevant documentation on the behalf of Mikey Kirschner, PA-C,as directed by  Mikey Kirschner, PA-C while in the presence of Mikey Kirschner, PA-C.   Patient: Kaylee Reyes   DOB: [birth year]   71 y.o. Female  MRN: [id number] Visit Date: 06/25/2022  Today's healthcare provider: Mikey Kirschner, PA-C   Chief Complaint  Patient presents with   Follow-up   Subjective    HPI  Pt was d/c 7/3 from [facility], tx for acute pancreatitis with IVF, hctz was d/c thought to be the cause. No evidence of collection, blockage, tumor on scans from admission. No history of etoh use and historically normal/ slightly high trigs. S/p cholecystectomy.   Pt has been on a liquid only diet since discharge, every food trial has resulted in abdominal pain and worsened nausea. She continues to be very nauseous, especially when lying down. Unsure if she is hydrating enough at home. Yesterday her abdominal pain started again, epigastric radiating to back.   Denies dizziness, vomiting, syncope.  Medications: Outpatient Medications Prior to Visit  Medication Sig   atenolol (TENORMIN) 25 MG tablet Take 1 tablet (25 mg total) by mouth in the morning. Take in addition to '50mg'$  in the morning.   atenolol (TENORMIN) 50 MG tablet TAKE 1 TABLET DAILY   B Complex-C (SUPER B COMPLEX PO) Take 1 tablet by mouth daily.   CALCIUM CITRATE PO Take 1 tablet by mouth daily.   Carboxymeth-Glycerin-Polysorb (REFRESH OPTIVE MEGA-3 OP) Place 1 drop into both eyes daily.   docusate sodium (COLACE) 100 MG capsule Take 200 mg by mouth daily.    ELDERBERRY PO Take by mouth daily.    lansoprazole (PREVACID) 30 MG capsule TAKE 1 CAPSULE BY MOUTH DAILY AT NOON(12:00 PM)   levothyroxine (SYNTHROID) 75 MCG tablet TAKE 1 TABLET DAILY   Magnesium 400 MG TABS Take 600 mg by mouth daily.    meclizine (ANTIVERT) 12.5 MG tablet TAKE 1  TABLET(12.5 MG) BY MOUTH THREE TIMES DAILY AS NEEDED FOR DIZZINESS   Misc Natural Products (TURMERIC CURCUMIN) CAPS Take 1 capsule by mouth daily.   ondansetron (ZOFRAN) 4 MG tablet Take 1 tablet (4 mg total) by mouth every 8 (eight) hours as needed for nausea or vomiting.   triamcinolone (KENALOG) 0.025 % cream Apply 1 Application topically 2 (two) times daily.   Vitamin D, Ergocalciferol, (DRISDOL) 1.25 MG (50000 UNIT) CAPS capsule TAKE 1 CAPSULE BY MOUTH 1 TIME A WEEK   vitamin E 180 MG (400 UNITS) capsule Take by mouth.   traMADol (ULTRAM) 50 MG tablet Take 1 tablet (50 mg total) by mouth every 8 (eight) hours as needed for moderate pain or severe pain.   No facility-administered medications prior to visit.    Review of Systems  Constitutional:  Negative for appetite change, chills, fatigue and fever.  Respiratory:  Negative for chest tightness and shortness of breath.   Cardiovascular:  Negative for chest pain and palpitations.  Gastrointestinal:  Positive for nausea. Negative for abdominal pain and vomiting.  Neurological:  Negative for dizziness and weakness.       Objective    BP (!) 141/76 (BP Location: Left Wrist, Patient Position: Sitting, Cuff Size: Large)   Pulse 60   Temp 98 F (36.7 C) (Oral)   Resp 16   Ht '5\' 6"'$  (1.676 m)   Wt 268 lb 9.6 oz (121.8 kg)   SpO2 99%  BMI 43.35 kg/m    Physical Exam Vitals reviewed.  Constitutional:      Appearance: She is not ill-appearing.  HENT:     Head: Normocephalic.  Eyes:     Conjunctiva/sclera: Conjunctivae normal.  Cardiovascular:     Rate and Rhythm: Normal rate.  Pulmonary:     Effort: Pulmonary effort is normal. No respiratory distress.  Abdominal:     General: There is no distension.     Tenderness: There is abdominal tenderness in the epigastric area and left upper quadrant.  Neurological:     General: No focal deficit present.     Mental Status: She is alert and oriented to person, place, and time.   Psychiatric:        Mood and Affect: Mood normal.        Behavior: Behavior normal.     No results found for any visits on 06/25/22.  Assessment & Plan     Problem List Items Addressed This Visit       Digestive   Acute pancreatitis - Primary    Lipase continues to elevate; nausea and pain persistent Lipase     Component Value Date/Time   LIPASE 143 (H) 06/23/2022 [place] conversation w/ pt that I do not think this is manageable outpatient any longer, she cannot continue for the next 1-2 weeks with a water diet; and earliest she can see GI is 8/2. Advised she go to the ED, gave her paperwork with my recommendations. Pt is understanding and in agreement         I, Mikey Kirschner, PA-C have reviewed all documentation for this visit. The documentation on  06/25/2022 for the exam, diagnosis, procedures, and orders are all accurate and complete.  Mikey Kirschner, PA-C [facility] [address] [place], [place], [address] Office: [phone] Fax: [place]

## 2022-06-25 ENCOUNTER — Encounter: Payer: Self-pay | Admitting: Physician Assistant

## 2022-06-25 ENCOUNTER — Encounter: Payer: Self-pay | Admitting: *Deleted

## 2022-06-25 ENCOUNTER — Telehealth: Payer: Self-pay

## 2022-06-25 ENCOUNTER — Other Ambulatory Visit: Payer: Self-pay

## 2022-06-25 ENCOUNTER — Other Ambulatory Visit: Payer: Self-pay | Admitting: Physician Assistant

## 2022-06-25 ENCOUNTER — Inpatient Hospital Stay
Admission: EM | Admit: 2022-06-25 | Discharge: 2022-06-27 | DRG: 439 | Disposition: A | Discharge status: Home / Self Care | Payer: Medicare Other | Attending: Internal Medicine | Admitting: Internal Medicine

## 2022-06-25 ENCOUNTER — Ambulatory Visit (INDEPENDENT_AMBULATORY_CARE_PROVIDER_SITE_OTHER): Payer: Medicare Other | Admitting: Physician Assistant

## 2022-06-25 VITALS — BP 141/76 | HR 60 | Temp 98.0°F | Resp 16 | Ht 66.0 in | Wt 268.6 lb

## 2022-06-25 DIAGNOSIS — Z9013 Acquired absence of bilateral breasts and nipples: Secondary | ICD-10-CM | POA: Diagnosis not present

## 2022-06-25 DIAGNOSIS — Z881 Allergy status to other antibiotic agents status: Secondary | ICD-10-CM | POA: Diagnosis not present

## 2022-06-25 DIAGNOSIS — Z8249 Family history of ischemic heart disease and other diseases of the circulatory system: Secondary | ICD-10-CM

## 2022-06-25 DIAGNOSIS — E039 Hypothyroidism, unspecified: Secondary | ICD-10-CM | POA: Diagnosis not present

## 2022-06-25 DIAGNOSIS — R001 Bradycardia, unspecified: Secondary | ICD-10-CM | POA: Diagnosis present

## 2022-06-25 DIAGNOSIS — R1010 Upper abdominal pain, unspecified: Secondary | ICD-10-CM | POA: Diagnosis not present

## 2022-06-25 DIAGNOSIS — K861 Other chronic pancreatitis: Secondary | ICD-10-CM

## 2022-06-25 DIAGNOSIS — K858 Other acute pancreatitis without necrosis or infection: Secondary | ICD-10-CM

## 2022-06-25 DIAGNOSIS — Z6841 Body Mass Index (BMI) 40.0 and over, adult: Secondary | ICD-10-CM

## 2022-06-25 DIAGNOSIS — R748 Abnormal levels of other serum enzymes: Secondary | ICD-10-CM

## 2022-06-25 DIAGNOSIS — K219 Gastro-esophageal reflux disease without esophagitis: Secondary | ICD-10-CM | POA: Diagnosis not present

## 2022-06-25 DIAGNOSIS — Z91048 Other nonmedicinal substance allergy status: Secondary | ICD-10-CM | POA: Diagnosis not present

## 2022-06-25 DIAGNOSIS — I10 Essential (primary) hypertension: Secondary | ICD-10-CM | POA: Diagnosis not present

## 2022-06-25 DIAGNOSIS — Z8616 Personal history of COVID-19: Secondary | ICD-10-CM

## 2022-06-25 DIAGNOSIS — E785 Hyperlipidemia, unspecified: Secondary | ICD-10-CM | POA: Diagnosis present

## 2022-06-25 DIAGNOSIS — Z888 Allergy status to other drugs, medicaments and biological substances status: Secondary | ICD-10-CM

## 2022-06-25 DIAGNOSIS — Z853 Personal history of malignant neoplasm of breast: Secondary | ICD-10-CM | POA: Diagnosis not present

## 2022-06-25 DIAGNOSIS — E860 Dehydration: Secondary | ICD-10-CM

## 2022-06-25 DIAGNOSIS — Z880 Allergy status to penicillin: Secondary | ICD-10-CM

## 2022-06-25 DIAGNOSIS — R110 Nausea: Secondary | ICD-10-CM | POA: Diagnosis not present

## 2022-06-25 DIAGNOSIS — Z8711 Personal history of peptic ulcer disease: Secondary | ICD-10-CM | POA: Diagnosis not present

## 2022-06-25 DIAGNOSIS — R109 Unspecified abdominal pain: Secondary | ICD-10-CM

## 2022-06-25 DIAGNOSIS — E66813 Obesity, class 3: Secondary | ICD-10-CM

## 2022-06-25 DIAGNOSIS — Z803 Family history of malignant neoplasm of breast: Secondary | ICD-10-CM

## 2022-06-25 DIAGNOSIS — K859 Acute pancreatitis without necrosis or infection, unspecified: Principal | ICD-10-CM | POA: Diagnosis present

## 2022-06-25 LAB — CBC
HCT: 39.1 % (ref 36.0–46.0)
Hemoglobin: 12.4 g/dL (ref 12.0–15.0)
MCH: 26.8 pg (ref 26.0–34.0)
MCHC: 31.7 g/dL (ref 30.0–36.0)
MCV: 84.6 fL (ref 80.0–100.0)
Platelets: 192 10*3/uL (ref 150–400)
RBC: 4.62 MIL/uL (ref 3.87–5.11)
RDW: 14.2 % (ref 11.5–15.5)
WBC: 7.5 10*3/uL (ref 4.0–10.5)
nRBC: 0 % (ref 0.0–0.2)

## 2022-06-25 LAB — COMPREHENSIVE METABOLIC PANEL
ALT: 18 U/L (ref 0–44)
AST: 24 U/L (ref 15–41)
Albumin: 4.6 g/dL (ref 3.5–5.0)
Alkaline Phosphatase: 109 U/L (ref 38–126)
Anion gap: 13 (ref 5–15)
BUN: 11 mg/dL (ref 8–23)
CO2: 23 mmol/L (ref 22–32)
Calcium: 9.7 mg/dL (ref 8.9–10.3)
Chloride: 102 mmol/L (ref 98–111)
Creatinine, Ser: 0.94 mg/dL (ref 0.44–1.00)
GFR, Estimated: >60 mL/min (ref >60)
Glucose, Bld: 101 mg/dL — ABNORMAL HIGH (ref 70–99)
Potassium: 3.9 mmol/L (ref 3.5–5.1)
Sodium: 138 mmol/L (ref 135–145)
Total Bilirubin: 1.2 mg/dL (ref 0.3–1.2)
Total Protein: 8.8 g/dL — ABNORMAL HIGH (ref 6.5–8.1)

## 2022-06-25 LAB — LACTATE DEHYDROGENASE: LDH: 156 U/L (ref 98–192)

## 2022-06-25 LAB — LACTIC ACID, PLASMA
Lactic Acid, Venous: 1 mmol/L (ref 0.5–1.9)
Lactic Acid, Venous: 0.7 mmol/L (ref 0.5–1.9)

## 2022-06-25 LAB — LIPASE, BLOOD: Lipase: 82 U/L — ABNORMAL HIGH (ref 11–51)

## 2022-06-25 MED ORDER — ONDANSETRON HCL 4 MG PO TABS
4.0000 mg | ORAL_TABLET | Freq: Four times a day (QID) | ORAL | Status: DC | PRN
  Administered 2022-06-26 (×2): 4 mg via ORAL
  Filled 2022-06-25 (×2): qty 1

## 2022-06-25 MED ORDER — SODIUM CHLORIDE 0.9 % IV SOLN: Freq: Once | INTRAVENOUS | Status: AC

## 2022-06-25 MED ORDER — SODIUM CHLORIDE 0.9 % IV SOLN
INTRAVENOUS | Status: AC
Start: 2022-06-25 — End: 2022-06-25

## 2022-06-25 MED ORDER — PANTOPRAZOLE SODIUM 40 MG PO TBEC
40.0000 mg | DELAYED_RELEASE_TABLET | Freq: Every day | ORAL | Status: DC
  Administered 2022-06-25 – 2022-06-27 (×3): 40 mg via ORAL
  Filled 2022-06-25 (×3): qty 1

## 2022-06-25 MED ORDER — ONDANSETRON HCL 4 MG/2ML IJ SOLN
4.0000 mg | Freq: Four times a day (QID) | INTRAMUSCULAR | Status: DC | PRN
  Administered 2022-06-25 – 2022-06-26 (×2): 4 mg via INTRAVENOUS
  Filled 2022-06-25 (×2): qty 2

## 2022-06-25 MED ORDER — ONDANSETRON HCL 4 MG/2ML IJ SOLN: 4.0000 mg | Freq: Four times a day (QID) | INTRAMUSCULAR | Status: DC | PRN

## 2022-06-25 MED ORDER — ONDANSETRON 4 MG PO TBDP
4.0000 mg | ORAL_TABLET | Freq: Once | ORAL | Status: AC | PRN
  Administered 2022-06-25: 4 mg via ORAL
  Filled 2022-06-25: qty 1

## 2022-06-25 MED ORDER — ENOXAPARIN SODIUM 60 MG/0.6ML IJ SOSY
0.5000 mg/kg | PREFILLED_SYRINGE | INTRAMUSCULAR | Status: DC
Start: 2022-06-25 — End: 2022-06-27
  Administered 2022-06-25 – 2022-06-26 (×2): 60 mg via SUBCUTANEOUS
  Filled 2022-06-25 (×2): qty 0.6

## 2022-06-25 MED ORDER — LEVOTHYROXINE SODIUM 50 MCG PO TABS
75.0000 ug | ORAL_TABLET | Freq: Every day | ORAL | Status: DC
  Administered 2022-06-26 – 2022-06-27 (×2): 75 ug via ORAL
  Filled 2022-06-25 (×2): qty 2

## 2022-06-25 MED ORDER — ONDANSETRON HCL 4 MG PO TABS: 4.0000 mg | ORAL_TABLET | Freq: Three times a day (TID) | ORAL | 0 refills | Status: DC | PRN

## 2022-06-25 MED ORDER — MORPHINE SULFATE (PF) 2 MG/ML IV SOLN: 2.0000 mg | INTRAVENOUS | Status: DC | PRN

## 2022-06-25 MED ORDER — ATENOLOL 25 MG PO TABS
75.0000 mg | ORAL_TABLET | Freq: Every day | ORAL | Status: DC
  Filled 2022-06-25: qty 3

## 2022-06-25 NOTE — Patient Instructions (Signed)
Instructed pt to go to ED ; recent d/c for idiopathic pancreatitis with continued abdominal pain, raising lipase, nausea, only on a liquid diet for the last week, no appetite.   Concern that this cannot be managed outpatient. Vitals stable in office.

## 2022-06-25 NOTE — H&P (Signed)
History and Physical    Patient: Kaylee Reyes YQM:[id number] DOB: [birth year] DOA: 06/25/2022 DOS: the patient was seen and examined on 06/25/2022 PCP: Birdie Sons, MD  Patient coming from: Home  Chief Complaint:  Chief Complaint  Patient presents with   Abdominal Pain   HPI: [name] is a 71 y.o. female with medical history significant of hypertension, GERD, hypothyroidism, peptic ulcer disease who presents to the emergency department due to 1 day onset of abdominal pain and nausea.  Patient was recently admitted from 6/30 to 7/2 due to acute pancreatitis.  She complained of epigastric and left upper quadrant pain (similar to prior presentation, but of less intensity).  Patient states that she was discharged on clear liquid diet, she states that she has been on clear liquid diet since the last 6 days, she attempted a solid diet yesterday and shortly after she developed the epigastric and left upper quadrant pain.  She had a work-up yesterday and lipase level was noted to be 143, she was seen by her PCP today and was asked to go to the ED for further evaluation and management.  She denies alcohol or tobacco use.  CT abdomen done on 6/30 was consistent with acute pancreatitis.   ED Course:  In the emergency department, she was intermittently bradycardic, BP was 141/76, other vital signs were within normal range.  Work-up in the ED showed normal CBC and BMP, lipase level was 82 Zofran was given, IV hydration was provided.  Hospitalist was asked to admit patient for further evaluation and management.  Review of Systems: Review of systems as noted in the HPI. All other systems reviewed and are negative.   Past Medical History:  Diagnosis Date   Anemia    Body tinea 10/01/2015   Previously treated by Dr. Koleen Nimrod    COVID-19 02/23/2021   MAB infusion 01/23/2021   GERD (gastroesophageal reflux disease)    History of breast cancer 09/2015   breast cancer   History of chicken  pox    History of peptic ulcer disease 09/25/2015   Hyperlipidemia    Hypertension    Hypothyroidism    Neuropathy of foot    bilateral   Pinched nerve    PONV (postoperative nausea and vomiting)    exploratory surgery at [facility]   Thyroid disease    Tuberculosis exposure    Mother had TB.  Pt shows (+) on tests, but has never had.   Vertigo    last episode over 1 yr ago   Wears dentures    partial upper and lower   Past Surgical History:  Procedure Laterality Date   ABDOMINAL HYSTERECTOMY  07/13/2013   Total. with BSO for postmenopausal bleeding and fibroids with large cervical polyp. Dr. Levy Sjogren and Dr. Glennon Mac at [place]  04/17/2011   Hepatomegaly with borderline slenomegaly. Suggestive of fatty liver. s/p cholecystectomy. Portions of aorta obscured   BREAST SURGERY Right 2011   BREAST BIOPSY   CESAREAN SECTION  1988   CHOLECYSTECTOMY  1990   COLONOSCOPY WITH PROPOFOL N/A 09/01/2017   Procedure: COLONOSCOPY WITH PROPOFOL;  Surgeon: [name], MD;  Location: [facility];  Service: Gastroenterology;  Laterality: N/A;   DIAGNOSTIC LAPAROSCOPY     ESOPHAGOGASTRODUODENOSCOPY (EGD) WITH PROPOFOL N/A 01/17/2020   Procedure: ESOPHAGOGASTRODUODENOSCOPY (EGD) WITH PROPOFOL;  Surgeon: [name], MD;  Location: [facility];  Service: Endoscopy;  Laterality: N/A;   EXPLORATORY LAPAROTOMY     Left breast  MASTECTOMY     MASTECTOMY W/ SENTINEL NODE BIOPSY Bilateral 10/28/2015   Procedure: MASTECTOMY WITH SENTINEL LYMPH NODE BIOPSY;  Surgeon: Hubbard Robinson, MD;  Location: [facility];  Service: General;  Laterality: Bilateral;   PORT-A-CATH REMOVAL Left 10/11/2016   Procedure: REMOVAL PORT-A-CATH;  Surgeon: Hubbard Robinson, MD;  Location: [facility];  Service: General;  Laterality: Left;   PORTACATH PLACEMENT Left 12/23/2015   Procedure: INSERTION PORT-A-CATH;  Surgeon: Hubbard Robinson, MD;  Location: [facility];  Service: General;  Laterality: Left;    WRIST SURGERY      Social History:  reports that she has never smoked. She has never used smokeless tobacco. She reports that she does not drink alcohol and does not use drugs.   Allergies  Allergen Reactions   Penicillins Shortness Of Breath    Has patient had a PCN reaction causing immediate rash, facial/tongue/throat swelling, SOB or lightheadedness with hypotension: Yes Has patient had a PCN reaction causing severe rash involving mucus membranes or skin necrosis: No Has patient had a PCN reaction that required hospitalization No Has patient had a PCN reaction occurring within the last 10 years: No If all of the above answers are "NO", then may proceed with Cephalosporin use.    Nickel Hives   Keflex [Cephalexin] Swelling    Throat swelling   Pravastatin Sodium Other (See Comments)    Myalgia     Family History  Problem Relation Age of Onset   Hypertension Mother    Thyroid disease Mother    Hypertension Brother    Diabetes Brother    Breast cancer Neg Hx    Ovarian cancer Neg Hx    Colon cancer Neg Hx      Prior to Admission medications   Medication Sig Start Date End Date Taking? Authorizing Provider  atenolol (TENORMIN) 25 MG tablet Take 1 tablet (25 mg total) by mouth in the morning. Take in addition to '50mg'$  in the morning. 03/11/22   Birdie Sons, MD  atenolol (TENORMIN) 50 MG tablet TAKE 1 TABLET DAILY 02/22/22   Birdie Sons, MD  B Complex-C (SUPER B COMPLEX PO) Take 1 tablet by mouth daily.    [provider]  CALCIUM CITRATE PO Take 1 tablet by mouth daily.    [provider]  Carboxymeth-Glycerin-Polysorb (REFRESH OPTIVE MEGA-3 OP) Place 1 drop into both eyes daily.    [provider]  docusate sodium (COLACE) 100 MG capsule Take 200 mg by mouth daily.     [provider]  ELDERBERRY PO Take by mouth daily.     [provider]  lansoprazole (PREVACID) 30 MG capsule TAKE 1 CAPSULE BY MOUTH DAILY AT NOON(12:00 PM)  01/07/22   Birdie Sons, MD  levothyroxine (SYNTHROID) 75 MCG tablet TAKE 1 TABLET DAILY 02/03/22   Birdie Sons, MD  Magnesium 400 MG TABS Take 600 mg by mouth daily.     [provider]  meclizine (ANTIVERT) 12.5 MG tablet TAKE 1 TABLET(12.5 MG) BY MOUTH THREE TIMES DAILY AS NEEDED FOR DIZZINESS 08/20/21   Birdie Sons, MD  Misc Natural Products (TURMERIC CURCUMIN) CAPS Take 1 capsule by mouth daily.    [provider]  ondansetron (ZOFRAN) 4 MG tablet Take 1 tablet (4 mg total) by mouth every 8 (eight) hours as needed for nausea or vomiting. 06/25/22   Drubel, Ria Comment, PA-C  traMADol (ULTRAM) 50 MG tablet Take 1 tablet (50 mg total) by mouth every 8 (eight)  hours as needed for moderate pain or severe pain. 06/06/22   Fritzi Mandes, MD  triamcinolone (KENALOG) 0.025 % cream Apply 1 Application topically 2 (two) times daily. 05/17/22   [provider]  Vitamin D, Ergocalciferol, (DRISDOL) 1.25 MG (50000 UNIT) CAPS capsule TAKE 1 CAPSULE BY MOUTH 1 TIME A WEEK 02/16/22   Lloyd Huger, MD  vitamin E 180 MG (400 UNITS) capsule Take by mouth.    [provider]  fluticasone (FLONASE) 50 MCG/ACT nasal spray Place 2 sprays into both nostrils daily. 01/21/21 01/21/21  Rodriguez-Southworth, Sunday Spillers, PA-C    Physical Exam: BP (!) 164/82 (BP Location: Left Arm)   Pulse (!) 56   Temp 98 F (36.7 C) (Oral)   Resp 18   Ht '5\' 6"'$  (1.676 m)   Wt 121.6 kg   SpO2 97%   BMI 43.26 kg/m   General: 71 y.o. year-old female well developed well nourished in no acute distress.  Alert and oriented x3. HEENT: NCAT, EOMI, dry mucous membrane Neck: Supple, trachea medial Cardiovascular: Regular rate and rhythm with no rubs or gallops.  No thyromegaly or JVD noted.  No lower extremity edema. 2/4 pulses in all 4 extremities. Respiratory: Clear to auscultation with no wheezes or rales. Good inspiratory effort. Abdomen: Soft, tender to palpation of epigastric and LUQ without  guarding.  Nondistended with normal bowel sounds x4 quadrants. Muskuloskeletal: No cyanosis, clubbing or edema noted bilaterally Neuro: CN II-XII intact, strength 5/5 x 4, sensation, reflexes intact Skin: No ulcerative lesions noted or rashes Psychiatry: Judgement and insight appear normal. Mood is appropriate for condition and setting          Labs on Admission:  Basic Metabolic Panel: Recent Labs  Lab 06/25/22 1051  NA 138  K 3.9  CL 102  CO2 23  GLUCOSE 101*  BUN 11  CREATININE 0.94  CALCIUM 9.7   Liver Function Tests: Recent Labs  Lab 06/25/22 1051  AST 24  ALT 18  ALKPHOS 109  BILITOT 1.2  PROT 8.8*  ALBUMIN 4.6   Recent Labs  Lab 06/23/22 0955 06/25/22 1051  LIPASE 143* 82*   No results for input(s): "AMMONIA" in the last 168 hours. CBC: Recent Labs  Lab 06/25/22 1051  WBC 7.5  HGB 12.4  HCT 39.1  MCV 84.6  PLT 192   Cardiac Enzymes: No results for input(s): "CKTOTAL", "CKMB", "CKMBINDEX", "TROPONINI" in the last 168 hours.  BNP (last 3 results) No results for input(s): "BNP" in the last 8760 hours.  ProBNP (last 3 results) No results for input(s): "PROBNP" in the last 8760 hours.  CBG: No results for input(s): "GLUCAP" in the last 168 hours.  Radiological Exams on Admission: No results found.  EKG: I independently viewed the EKG done and my findings are as followed: EKG was not done in the ED  Assessment/Plan Present on Admission:  GERD (gastroesophageal reflux disease)  Active Problems:   Essential hypertension   Acquired hypothyroidism   GERD (gastroesophageal reflux disease)   Obesity, Class III, BMI 40-49.9 (morbid obesity) (HCC)   Abdominal pain   Nausea   Dehydration   Elevated lipase  Abdominal pain and nausea Continue IV morphine 2 mg every 4 hours as needed Continue Zofran as needed  Elevated lipase level Patient recently had acute pancreatitis, lipase level has since gone down to 82 She was still unable to advance  diet and was still on clear liquid Continue IV hydration and pain medication and try to advance diet  as tolerated CT abdomen pelvis done on 6/30 was consistent with acute pancreatitis Lipid profile will be checked  Dehydration Continue IV hydration  Essential hypertension Continue atenolol  GERD Continue Protonix  Acquired hypothyroidism Continue Synthroid  Obesity (BMI 43.26 kg/m) Diet and lifestyle modification Patient may benefit from outpatient PCP weight loss program  DVT prophylaxis: Lovenox  Code Status: Full code  Consults: None  Family Communication: Sister at bedside (all questions answered to satisfaction)  Severity of Illness: The appropriate patient status for this patient is OBSERVATION. Observation status is judged to be reasonable and necessary in order to provide the required intensity of service to ensure the patient's safety. The patient's presenting symptoms, physical exam findings, and initial radiographic and laboratory data in the context of their medical condition is felt to place them at decreased risk for further clinical deterioration. Furthermore, it is anticipated that the patient will be medically stable for discharge from the hospital within 2 midnights of admission.   Author: Bernadette Hoit, DO 06/25/2022 2:52 PM  For on call review [email or web address].

## 2022-06-25 NOTE — ED Triage Notes (Signed)
Per patient's report, patient was admitted to hospital two weeks for pancreatitis. Patient states lipase values have increased. Patient states she has been on a clear liquid diet.

## 2022-06-25 NOTE — Progress Notes (Signed)
PHARMACIST - PHYSICIAN COMMUNICATION  CONCERNING:  Enoxaparin (Lovenox) for DVT Prophylaxis    RECOMMENDATION: Patient was prescribed enoxaprin '40mg'$  q24 hours for VTE prophylaxis.   Filed Weights   06/25/22 1031  Weight: 121.6 kg (268 lb)    Body mass index is 43.26 kg/m.  Estimated Creatinine Clearance: 74 mL/min (by C-G formula based on SCr of 0.94 mg/dL).  Based on [place] patient is candidate for enoxaparin 0.'5mg'$ /kg TBW SQ every 24 hours based on BMI being >30.  DESCRIPTION: Pharmacy has adjusted enoxaparin dose per [facility] policy.  Patient is now receiving enoxaparin 0.5 mg/kg every 24 hours   Renda Rolls, PharmD, [facility] 06/25/2022 3:15 PM

## 2022-06-25 NOTE — ED Notes (Signed)
Hospitalist at bedside 

## 2022-06-25 NOTE — Telephone Encounter (Signed)
Copied from [place] [id number]. Topic: General - Other >> Jun 25, 2022  9:54 AM Kaylee Reyes wrote: Patient states she was to be prescribed zofran today, Patient states she is going to the hospital as provider recommended at todays 7-21 visit but want to have the zofran on hand as a preventative  Please assist further

## 2022-06-25 NOTE — ED Provider Notes (Signed)
[facility] Provider Note    Event Date/Time   First MD Initiated Contact with Patient 06/25/22 1128     (approximate)   History   Abdominal Pain   HPI  Kaylee Reyes is a 71 y.o. female with a PMH of pancreatitis, hypertension, hypothyroidism, and GERD who presents with epigastric abdominal pain and abnormal labs.  The patient was admitted a few weeks ago with pancreatitis and discharged after her lipase was trending down.  She does not have a history of alcohol abuse or gallstones.  It was thought to be possibly due to her hydrochlorothiazide use.  Since then, the patient has had to maintain a clear liquid diet.  Anytime she tries solids, she starts to have nausea and vomiting.  She most recently tried yesterday and was unable to tolerate any solid food.  She saw her doctor today and found that her lipase had increased so she was sent to the ED for further evaluation and likely admission.  The patient denies any diarrhea or fever.  She has no chest pain or shortness of breath.      Physical Exam   Triage Vital Signs: ED Triage Vitals  Enc Vitals Group     BP 06/25/22 1027 (!) 150/68     Pulse Rate 06/25/22 1027 63     Resp 06/25/22 1027 18     Temp 06/25/22 1027 98.1 F (36.7 C)     Temp src --      SpO2 06/25/22 1027 96 %     Weight 06/25/22 1031 268 lb (121.6 kg)     Height 06/25/22 1031 '5\' 6"'$  (1.676 m)     Head Circumference --      Peak Flow --      Pain Score 06/25/22 1030 4     Pain Loc --      Pain Edu? --      Excl. in [place]? --     Most recent vital signs: Vitals:   06/25/22 1230 06/25/22 1245  BP: (!) 148/75   Pulse: (!) 55 (!) 56  Resp:    Temp:    SpO2: 97% 96%     General: Awake, no distress.  CV:  Good peripheral perfusion.  Resp:  Normal effort.  Abd:  Soft with mild epigastric discomfort.  No distention.  Other:  No jaundice.   ED Results / Procedures / Treatments   Labs (all labs ordered are listed, but only  abnormal results are displayed) Labs Reviewed  LIPASE, BLOOD - Abnormal; Notable for the following components:      Result Value   Lipase 82 (*)    All other components within normal limits  COMPREHENSIVE METABOLIC PANEL - Abnormal; Notable for the following components:   Glucose, Bld 101 (*)    Total Protein 8.8 (*)    All other components within normal limits  CBC  LACTATE DEHYDROGENASE  LACTIC ACID, PLASMA  LACTIC ACID, PLASMA     EKG   RADIOLOGY   PROCEDURES:  Critical Care performed: No  Procedures   MEDICATIONS ORDERED IN ED: Medications  ondansetron (ZOFRAN-ODT) disintegrating tablet 4 mg (4 mg Oral Given 06/25/22 1039)  0.9 %  sodium chloride infusion ( Intravenous New Bag/Given 06/25/22 1303)     IMPRESSION / MDM / ASSESSMENT AND PLAN / ED COURSE  I reviewed the triage vital signs and the nursing notes.  Patient presents with persistent nausea and epigastric pain when trying to eat any solids.  I reviewed the past medical records.  The patient was admitted at the end of last month and per the hospitalist discharge summary from 06/06/22 she was treated for acute pancreatitis thought to be related to hydrochlorothiazide.  She does not drink alcohol and is status post cholecystectomy with no evidence of retained stones.  Differential diagnosis includes, but is not limited to, chronic pancreatitis, GERD, PUD, gastritis, gastroparesis.  Patient's presentation is most consistent with acute presentation with potential threat to life or bodily function.  The patient is on the cardiac monitor to evaluate for evidence of arrhythmia and/or significant heart rate changes.  Repeat labs today show the lipase has trended back down slightly.  Is no leukocytosis.  Electrolytes are unremarkable.  However, the patient appears clinically somewhat dehydrated and is not able to tolerate adequate p.o.  Therefore I will admit her for further evaluation and  work-up.  ----------------------------------------- 1:16 PM on 06/25/2022 -----------------------------------------  I consulted Dr. Josephine Cables from the hospitalist service; based on our discussion she agrees to admit the patient.   FINAL CLINICAL IMPRESSION(S) / ED DIAGNOSES   Final diagnoses:  Chronic pancreatitis, unspecified pancreatitis type ([place])     Rx / DC Orders   ED Discharge Orders     None        Note:  This document was prepared using Dragon voice recognition software and may include unintentional dictation errors.    Arta Silence, MD 06/25/22 1321

## 2022-06-25 NOTE — Assessment & Plan Note (Addendum)
Lipase continues to elevate; nausea and pain persistent Lipase     Component Value Date/Time   LIPASE 143 (H) 06/23/2022 [place] conversation w/ pt that I do not think this is manageable outpatient any longer, she cannot continue for the next 1-2 weeks with a water diet; and earliest she can see GI is 8/2. Advised she go to the ED, gave her paperwork with my recommendations. Pt is understanding and in agreement

## 2022-06-26 DIAGNOSIS — Z888 Allergy status to other drugs, medicaments and biological substances status: Secondary | ICD-10-CM | POA: Diagnosis not present

## 2022-06-26 DIAGNOSIS — Z880 Allergy status to penicillin: Secondary | ICD-10-CM | POA: Diagnosis not present

## 2022-06-26 DIAGNOSIS — Z91048 Other nonmedicinal substance allergy status: Secondary | ICD-10-CM | POA: Diagnosis not present

## 2022-06-26 DIAGNOSIS — Z8711 Personal history of peptic ulcer disease: Secondary | ICD-10-CM | POA: Diagnosis not present

## 2022-06-26 DIAGNOSIS — E785 Hyperlipidemia, unspecified: Secondary | ICD-10-CM | POA: Diagnosis present

## 2022-06-26 DIAGNOSIS — E860 Dehydration: Secondary | ICD-10-CM | POA: Diagnosis present

## 2022-06-26 DIAGNOSIS — Z881 Allergy status to other antibiotic agents status: Secondary | ICD-10-CM | POA: Diagnosis not present

## 2022-06-26 DIAGNOSIS — Z803 Family history of malignant neoplasm of breast: Secondary | ICD-10-CM | POA: Diagnosis not present

## 2022-06-26 DIAGNOSIS — Z8616 Personal history of COVID-19: Secondary | ICD-10-CM | POA: Diagnosis not present

## 2022-06-26 DIAGNOSIS — Z8249 Family history of ischemic heart disease and other diseases of the circulatory system: Secondary | ICD-10-CM | POA: Diagnosis not present

## 2022-06-26 DIAGNOSIS — R1010 Upper abdominal pain, unspecified: Secondary | ICD-10-CM | POA: Diagnosis not present

## 2022-06-26 DIAGNOSIS — Z6841 Body Mass Index (BMI) 40.0 and over, adult: Secondary | ICD-10-CM | POA: Diagnosis not present

## 2022-06-26 DIAGNOSIS — K219 Gastro-esophageal reflux disease without esophagitis: Secondary | ICD-10-CM | POA: Diagnosis present

## 2022-06-26 DIAGNOSIS — K859 Acute pancreatitis without necrosis or infection, unspecified: Secondary | ICD-10-CM | POA: Diagnosis present

## 2022-06-26 DIAGNOSIS — Z853 Personal history of malignant neoplasm of breast: Secondary | ICD-10-CM | POA: Diagnosis not present

## 2022-06-26 DIAGNOSIS — Z9013 Acquired absence of bilateral breasts and nipples: Secondary | ICD-10-CM | POA: Diagnosis not present

## 2022-06-26 DIAGNOSIS — R001 Bradycardia, unspecified: Secondary | ICD-10-CM | POA: Diagnosis present

## 2022-06-26 DIAGNOSIS — E039 Hypothyroidism, unspecified: Secondary | ICD-10-CM | POA: Diagnosis present

## 2022-06-26 DIAGNOSIS — I10 Essential (primary) hypertension: Secondary | ICD-10-CM | POA: Diagnosis present

## 2022-06-26 LAB — COMPREHENSIVE METABOLIC PANEL
ALT: 16 U/L (ref 0–44)
AST: 20 U/L (ref 15–41)
Albumin: 3.9 g/dL (ref 3.5–5.0)
Alkaline Phosphatase: 96 U/L (ref 38–126)
Anion gap: 8 (ref 5–15)
BUN: 9 mg/dL (ref 8–23)
CO2: 21 mmol/L — ABNORMAL LOW (ref 22–32)
Calcium: 8.6 mg/dL — ABNORMAL LOW (ref 8.9–10.3)
Chloride: 109 mmol/L (ref 98–111)
Creatinine, Ser: 0.84 mg/dL (ref 0.44–1.00)
GFR, Estimated: >60 mL/min (ref >60)
Glucose, Bld: 85 mg/dL (ref 70–99)
Potassium: 4 mmol/L (ref 3.5–5.1)
Sodium: 138 mmol/L (ref 135–145)
Total Bilirubin: 0.7 mg/dL (ref 0.3–1.2)
Total Protein: 7.1 g/dL (ref 6.5–8.1)

## 2022-06-26 LAB — LIPID PANEL
Cholesterol: 205 mg/dL — ABNORMAL HIGH (ref 0–200)
HDL: 26 mg/dL — ABNORMAL LOW (ref >40)
LDL Cholesterol: 154 mg/dL — ABNORMAL HIGH (ref 0–99)
Total CHOL/HDL Ratio: 7.9 RATIO
Triglycerides: 124 mg/dL (ref <150)
VLDL: 25 mg/dL (ref 0–40)

## 2022-06-26 LAB — CBC
HCT: 35.1 % — ABNORMAL LOW (ref 36.0–46.0)
Hemoglobin: 11.3 g/dL — ABNORMAL LOW (ref 12.0–15.0)
MCH: 27.7 pg (ref 26.0–34.0)
MCHC: 32.2 g/dL (ref 30.0–36.0)
MCV: 86 fL (ref 80.0–100.0)
Platelets: 128 10*3/uL — ABNORMAL LOW (ref 150–400)
RBC: 4.08 MIL/uL (ref 3.87–5.11)
RDW: 14.2 % (ref 11.5–15.5)
WBC: 4.8 10*3/uL (ref 4.0–10.5)
nRBC: 0 % (ref 0.0–0.2)

## 2022-06-26 LAB — PHOSPHORUS: Phosphorus: 3.5 mg/dL (ref 2.5–4.6)

## 2022-06-26 LAB — APTT: aPTT: 33 seconds (ref 24–36)

## 2022-06-26 LAB — MAGNESIUM: Magnesium: 2.2 mg/dL (ref 1.7–2.4)

## 2022-06-26 MED ORDER — SODIUM CHLORIDE 0.9 % IV SOLN: INTRAVENOUS | Status: DC

## 2022-06-26 MED ORDER — PROMETHAZINE HCL 25 MG PO TABS
25.0000 mg | ORAL_TABLET | Freq: Once | ORAL | Status: AC
  Administered 2022-06-26: 25 mg via ORAL
  Filled 2022-06-26: qty 1

## 2022-06-26 MED ORDER — ATENOLOL 25 MG PO TABS
12.5000 mg | ORAL_TABLET | Freq: Every day | ORAL | Status: DC
  Filled 2022-06-26: qty 0.5

## 2022-06-26 NOTE — Progress Notes (Signed)
PROGRESS NOTE    Kaylee Reyes  WRU:[id number] DOB: 11/03/1951 DOA: 06/25/2022 PCP: Birdie Sons, MD    Brief Narrative:  Kaylee Reyes is a 71 y.o. female with medical history significant of hypertension, GERD, hypothyroidism, peptic ulcer disease who presents to the emergency department due to 1 day onset of abdominal pain and nausea.  Patient was recently admitted from 6/30 to 7/2 due to acute pancreatitis.  She complained of epigastric and left upper quadrant pain (similar to prior presentation, but of less intensity).  Patient states that she was discharged on clear liquid diet, she states that she has been on clear liquid diet since the last 6 days, she attempted a solid diet yesterday and shortly after she developed the epigastric and left upper quadrant pain.  She had a work-up yesterday and lipase level was noted to be 143, she was seen by her PCP today and was asked to go to the ED for further evaluation and management.  She denies alcohol or tobacco use.  CT abdomen done on 6/30 was consistent with acute pancreatitis.  7/22 poor intake.  Has nausea.  No vomiting this a.m. some abd pain    Consultants:    Procedures:   Antimicrobials:      Subjective: No cp or sob  Objective: Vitals:   06/25/22 1939 06/26/22 0304 06/26/22 0731 06/26/22 1543  BP: 137/72 (!) 152/75 (!) 150/74 (!) 149/65  Pulse: (!) 58 (!) 50 (!) 54 62  Resp: '16 16 18 18  '$ Temp: 98.2 F (36.8 C) 97.8 F (36.6 C) 98.1 F (36.7 C) 97.9 F (36.6 C)  TempSrc: Oral Oral Oral Oral  SpO2: 97% 99% 99% 100%  Weight:      Height:        Intake/Output Summary (Last 24 hours) at 06/26/2022 1637 Last data filed at 06/26/2022 0035 Gross per 24 hour  Intake 1549.88 ml  Output --  Net 1549.88 ml   Filed Weights   06/25/22 1031  Weight: 121.6 kg    Examination: Calm, NAD Cta no w/r Reg s1/s2 no gallop Soft mild ttp epigastric. +bs No edema Aaoxox3  Mood and affect appropriate in current  setting     Data Reviewed: I have personally reviewed following labs and imaging studies  CBC: Recent Labs  Lab 06/25/22 1051 06/26/22 0705  WBC 7.5 4.8  HGB 12.4 11.3*  HCT 39.1 35.1*  MCV 84.6 86.0  PLT 192 914*   Basic Metabolic Panel: Recent Labs  Lab 06/25/22 1051 06/26/22 0705  NA 138 138  K 3.9 4.0  CL 102 109  CO2 23 21*  GLUCOSE 101* 85  BUN 11 9  CREATININE 0.94 0.84  CALCIUM 9.7 8.6*  MG  --  2.2  PHOS  --  3.5   GFR: Estimated Creatinine Clearance: 82.8 mL/min (by C-G formula based on SCr of 0.84 mg/dL). Liver Function Tests: Recent Labs  Lab 06/25/22 1051 06/26/22 0705  AST 24 20  ALT 18 16  ALKPHOS 109 96  BILITOT 1.2 0.7  PROT 8.8* 7.1  ALBUMIN 4.6 3.9   Recent Labs  Lab 06/23/22 0955 06/25/22 1051  LIPASE 143* 82*   No results for input(s): "AMMONIA" in the last 168 hours. Coagulation Profile: No results for input(s): "INR", "PROTIME" in the last 168 hours. Cardiac Enzymes: No results for input(s): "CKTOTAL", "CKMB", "CKMBINDEX", "TROPONINI" in the last 168 hours. BNP (last 3 results) No results for input(s): "PROBNP" in the last 8760 hours. HbA1C: No  results for input(s): "HGBA1C" in the last 72 hours. CBG: No results for input(s): "GLUCAP" in the last 168 hours. Lipid Profile: Recent Labs    06/26/22 0705  CHOL 205*  HDL 26*  LDLCALC 154*  TRIG 124  CHOLHDL 7.9   Thyroid Function Tests: No results for input(s): "TSH", "T4TOTAL", "FREET4", "T3FREE", "THYROIDAB" in the last 72 hours. Anemia Panel: No results for input(s): "VITAMINB12", "FOLATE", "FERRITIN", "TIBC", "IRON", "RETICCTPCT" in the last 72 hours. Sepsis Labs: Recent Labs  Lab 06/25/22 1252 06/25/22 1707  LATICACIDVEN 1.0 0.7    No results found for this or any previous visit (from the past 240 hour(s)).       Radiology Studies: No results found.      Scheduled Meds:  [START ON 06/27/2022] atenolol  12.5 mg Oral Q breakfast   enoxaparin  (LOVENOX) injection  0.5 mg/kg Subcutaneous Q24H   levothyroxine  75 mcg Oral Q0600   pantoprazole  40 mg Oral Daily   Continuous Infusions:  sodium chloride 75 mL/hr at 06/26/22 1429    Assessment & Plan:   Active Problems:   Essential hypertension   Acquired hypothyroidism   GERD (gastroesophageal reflux disease)   Obesity, Class III, BMI 40-49.9 (morbid obesity) (HCC)   Abdominal pain   Nausea   Dehydration   Elevated lipase   1.abd pain Likely from pancreatitis Poor po intake Needs ivf Pain mx Lipase trending down Advance diet to full liquids if she tolerates Antiemetics  2.Dehydration Continue IV fluids   Essential hypertension Continue atenolol, heart rate on lower side will decrease dose  GERD Continue Protonix  Acquired hypothyroidism Continue Synthroid  Obesity (BMI 43.26 kg/m) Diet and lifestyle modification Patient may benefit from outpatient PCP weight loss program     DVT prophylaxis: Lovenox Code Status: Family Communication: Mother at bedside Disposition Plan:  Status is: Observation The patient will require care spanning > 2 midnights and should be moved to inpatient because: Poor p.o. intake needs IV fluids/IV treatment pain management        LOS: 0 days   Time spent: 35 minutes    Nolberto Hanlon, MD [facility] Pager [phone]  If 7PM-7AM, please contact night-coverage 06/26/2022, 4:37 PM

## 2022-06-26 NOTE — Progress Notes (Signed)
Mobility Specialist - Progress Note   06/26/22 1400  Mobility  Activity Ambulated independently in room;Stood at bedside;Dangled on edge of bed;Ambulated independently to bathroom  Level of Assistance Independent  Assistive Device None  Distance Ambulated (ft) 10 ft  Activity Response Tolerated well  $Mobility charge 1 Mobility     Pre-mobility: HR, BP, SpO2 During mobility: HR, BP, SpO2 Post-mobility: HR, BP, SPO2  Pt semi supine upon arrival using RA. Completes all activities indep, voicing no complaints.  Merrily Brittle Mobility Specialist 06/27/22, 9:56 AM

## 2022-06-27 DIAGNOSIS — K859 Acute pancreatitis without necrosis or infection, unspecified: Secondary | ICD-10-CM | POA: Diagnosis not present

## 2022-06-27 LAB — CBC
HCT: 38.9 % (ref 36.0–46.0)
Hemoglobin: 12.2 g/dL (ref 12.0–15.0)
MCH: 27.3 pg (ref 26.0–34.0)
MCHC: 31.4 g/dL (ref 30.0–36.0)
MCV: 87 fL (ref 80.0–100.0)
Platelets: 176 10*3/uL (ref 150–400)
RBC: 4.47 MIL/uL (ref 3.87–5.11)
RDW: 14.3 % (ref 11.5–15.5)
WBC: 5.2 10*3/uL (ref 4.0–10.5)
nRBC: 0 % (ref 0.0–0.2)

## 2022-06-27 MED ORDER — ATENOLOL 25 MG PO TABS: 12.5000 mg | ORAL_TABLET | Freq: Every day | ORAL | 0 refills | Status: DC

## 2022-06-27 NOTE — Discharge Summary (Signed)
Kaylee Reyes OJJ:[id number] DOB: 1951/03/10 DOA: 06/25/2022  PCP: Birdie Sons, MD  Admit date: 06/25/2022 Discharge date: 06/27/2022  Admitted From: Home Disposition: Home  Recommendations for Outpatient Follow-up:  Follow up with PCP in 1 week Please obtain BMP/CBC in one week Please follow up with gastroenterology as scheduled     Discharge Condition:Stable CODE STATUS: Full Diet recommendation: Heart Healthy  Brief/Interim Summary: Per HPI: Kaylee Reyes is a 71 y.o. female with medical history significant of hypertension, GERD, hypothyroidism, peptic ulcer disease who presents to the emergency department due to 1 day onset of abdominal pain and nausea.  Patient was recently admitted from 6/30 to 7/2 due to acute pancreatitis.  She complained of epigastric and left upper quadrant pain (similar to prior presentation, but of less intensity).  Patient states that she was discharged on clear liquid diet, she states that she has been on clear liquid diet since the last 6 days, she attempted a solid diet yesterday and shortly after she developed the epigastric and left upper quadrant pain.  She had a work-up yesterday and lipase level was noted to be 143, she was seen by her PCP today and was asked to go to the ED for further evaluation and management.  She denies alcohol or tobacco use.  CT abdomen done on 6/30 was consistent with acute pancreatitis. ED Course:  In the emergency department, she was intermittently bradycardic, BP was 141/76, other vital signs were within normal range.  Work-up in the ED showed normal CBC and BMP, lipase level was 82 Zofran was given, IV hydration was provided.  Admitted patient for further evaluation and management.  1.abd pain Acute pancreatitis Poor po intake Needed ivf Lipase trended down diet was advanced to soft which she tolerated Recommended to do soft diet tomorrow to and advance as tolerated slowly.  Should avoid fatty foods, and follow a  low-fat diet.  Avoid fried foods.  If can avoid coffee Her triglycerides were 124, LDL 154, cholesterol 205, HDL 26    2.Dehydration Improved with IV fluids   Essential hypertension Stable.  Continue lower dose of atenolol due to bradycardia  Follow-up PCP for further management  GERD Continue PPI  Acquired hypothyroidism Continue Synthroid  Obesity (BMI 43.26 kg/m) Diet and lifestyle modification Patient may benefit from outpatient PCP weight loss program      Discharge Diagnoses:  Principal Problem:   Subacute pancreatitis Active Problems:   Essential hypertension   Acquired hypothyroidism   GERD (gastroesophageal reflux disease)   Obesity, Class III, BMI 40-49.9 (morbid obesity) (HCC)   Abdominal pain   Nausea   Dehydration   Elevated lipase    Discharge Instructions  Discharge Instructions     Diet - low sodium heart healthy   Complete by: As directed    Discharge instructions   Complete by: As directed    Continue soft diet tomorrow then advance slowly to low fat diet. Nothing greasy, nothing fried.avoid coffee.   Increase activity slowly   Complete by: As directed       Allergies as of 06/27/2022       Reactions   Penicillins Shortness Of Breath   Has patient had a PCN reaction causing immediate rash, facial/tongue/throat swelling, SOB or lightheadedness with hypotension: Yes Has patient had a PCN reaction causing severe rash involving mucus membranes or skin necrosis: No Has patient had a PCN reaction that required hospitalization No Has patient had a PCN reaction occurring within the last 10 years:  No If all of the above answers are "NO", then may proceed with Cephalosporin use.   Nickel Hives   Keflex [cephalexin] Swelling   Throat swelling   Pravastatin Sodium Other (See Comments)   Myalgia        Medication List     STOP taking these medications    CALCIUM CITRATE PO   ELDERBERRY PO   Magnesium 400 MG Tabs   SUPER B COMPLEX  PO   traMADol 50 MG tablet Commonly known as: ULTRAM   Turmeric Curcumin Caps   vitamin E 180 MG (400 UNITS) capsule       TAKE these medications    atenolol 25 MG tablet Commonly known as: TENORMIN Take 0.5 tablets (12.5 mg total) by mouth daily with breakfast. Start taking on: June 28, 2022 What changed:  how much to take when to take this additional instructions Another medication with the same name was removed. Continue taking this medication, and follow the directions you see here.   docusate sodium 100 MG capsule Commonly known as: COLACE Take 200 mg by mouth daily. 2 in am, 1 at night   fexofenadine 60 MG tablet Commonly known as: ALLEGRA Take 60 mg by mouth daily.   lansoprazole 30 MG capsule Commonly known as: PREVACID TAKE 1 CAPSULE BY MOUTH DAILY AT NOON(12:00 PM)   levothyroxine 75 MCG tablet Commonly known as: SYNTHROID TAKE 1 TABLET DAILY   meclizine 12.5 MG tablet Commonly known as: ANTIVERT TAKE 1 TABLET(12.5 MG) BY MOUTH THREE TIMES DAILY AS NEEDED FOR DIZZINESS   ondansetron 4 MG tablet Commonly known as: Zofran Take 1 tablet (4 mg total) by mouth every 8 (eight) hours as needed for nausea or vomiting.   REFRESH OPTIVE MEGA-3 OP Place 1 drop into both eyes daily.   triamcinolone 0.025 % cream Commonly known as: KENALOG Apply 1 Application topically 2 (two) times daily.   Vitamin D (Ergocalciferol) 1.25 MG (50000 UNIT) Caps capsule Commonly known as: DRISDOL TAKE 1 CAPSULE BY MOUTH 1 TIME A WEEK        Allergies  Allergen Reactions   Penicillins Shortness Of Breath    Has patient had a PCN reaction causing immediate rash, facial/tongue/throat swelling, SOB or lightheadedness with hypotension: Yes Has patient had a PCN reaction causing severe rash involving mucus membranes or skin necrosis: No Has patient had a PCN reaction that required hospitalization No Has patient had a PCN reaction occurring within the last 10 years: No If all  of the above answers are "NO", then may proceed with Cephalosporin use.    Nickel Hives   Keflex [Cephalexin] Swelling    Throat swelling   Pravastatin Sodium Other (See Comments)    Myalgia     Consultations:    Procedures/Studies: CT ABDOMEN PELVIS W CONTRAST  Result Date: 06/04/2022 CLINICAL DATA:  Acute pancreatitis, evaluate complications or hepatobiliary obstruction Abdominal pain radiating to the back. EXAM: CT ABDOMEN AND PELVIS WITH CONTRAST TECHNIQUE: Multidetector CT imaging of the abdomen and pelvis was performed using the standard protocol following bolus administration of intravenous contrast. RADIATION DOSE REDUCTION: This exam was performed according to the departmental dose-optimization program which includes automated exposure control, adjustment of the mA and/or kV according to patient size and/or use of iterative reconstruction technique. CONTRAST:  16m OMNIPAQUE IOHEXOL 300 MG/ML  SOLN COMPARISON:  Abdominal ultrasound 05/18/2021, remote PET-CT 10/23/2015 FINDINGS: Lower chest: Subsegmental linear atelectasis in the left lower lobe. No pleural effusion. Hepatobiliary: Diffusely decreased hepatic density consistent with  steatosis. Liver is enlarged spanning 25 cm cranial caudal. No focal hepatic lesion. No capsular nodularity. Cholecystectomy. Normal common bile duct. No intrahepatic biliary ductal dilatation. Pancreas: Mild fat stranding and edema about the uncinate process. No evidence of pancreatic necrosis or acute peripancreatic collection. No ductal dilatation. No visualized pancreatic mass. Spleen: Enlarged spanning 14.2 cm AP.  No focal abnormality. Adrenals/Urinary Tract: Normal adrenal glands. No hydronephrosis or perinephric edema. Homogeneous renal enhancement with symmetric excretion on delayed phase imaging. No renal calculi. Small exophytic cyst from the anterior lower kidney, needing no further follow-up. Urinary bladder is only minimally distended, normal for  degree of distension. Stomach/Bowel: Small hiatal hernia. Ingested material within the stomach. There is mild edema about the third and fourth portion of the duodenum, likely related to primary pancreatic inflammation rather than duodenal inflammation. The small bowel is otherwise unremarkable. No small bowel obstruction. Medial deviation of the cecum in the central abdomen. Normal air-filled appendix in the mid abdomen. There is no mesenteric swirling or twisting. Mild multifocal colonic diverticulosis. No diverticulitis or acute colonic inflammation the sigmoid colon is redundant. Vascular/Lymphatic: Aortic atherosclerosis. No aortic aneurysm. Patent portal vein. Patent splenic and mesenteric veins. No abdominopelvic adenopathy. Reproductive: Hysterectomy. Stable appearance of the vaginal cuff from prior PET. The left ovary remains prominent size spanning 2.6 cm, however this is unchanged from 2016 CT and considered benign. No adnexal mass. Other: Small fat containing umbilical hernia. No free air or ascites. Musculoskeletal: Diffuse degenerative change in the lumbar spine with multilevel degenerative disc disease and facet hypertrophy. Bilateral hip osteoarthritis. There are no acute or suspicious osseous abnormalities. IMPRESSION: 1. Mild fat stranding and edema about the uncinate process of the pancreas consistent with acute pancreatitis. No evidence of pancreatic necrosis or acute peripancreatic collection. 2. Hepatomegaly and hepatic steatosis.  No biliary obstruction. 3. Mild splenomegaly. 4. Colonic diverticulosis without diverticulitis. Aortic Atherosclerosis (ICD10-I70.0). Electronically Signed   By: Keith Rake M.D.   On: 06/04/2022 22:51      Subjective: No abdominal pain.  No vomiting.  Minimal nausea after breakfast.  Discharge Exam: Vitals:   06/26/22 2049 06/27/22 0758  BP: (!) 163/65 (!) 151/79  Pulse: 60 (!) 57  Resp: 20 18  Temp: 98.1 F (36.7 C) 97.7 F (36.5 C)  SpO2: 97%  98%   Vitals:   06/26/22 0731 06/26/22 1543 06/26/22 2049 06/27/22 0758  BP: (!) 150/74 (!) 149/65 (!) 163/65 (!) 151/79  Pulse: (!) 54 62 60 (!) 57  Resp: '18 18 20 18  '$ Temp: 98.1 F (36.7 C) 97.9 F (36.6 C) 98.1 F (36.7 C) 97.7 F (36.5 C)  TempSrc: Oral Oral Oral   SpO2: 99% 100% 97% 98%  Weight:      Height:        General: Pt is alert, awake, not in acute distress Cardiovascular: RRR, S1/S2 +, no rubs, no gallops Respiratory: CTA bilaterally, no wheezing, no rhonchi Abdominal: Soft, NT, ND, bowel sounds + Extremities: no edema, no cyanosis    The results of significant diagnostics from this hospitalization (including imaging, microbiology, ancillary and laboratory) are listed below for reference.     Microbiology: No results found for this or any previous visit (from the past 240 hour(s)).   Labs: BNP (last 3 results) No results for input(s): "BNP" in the last 8760 hours. Basic Metabolic Panel: Recent Labs  Lab 06/25/22 1051 06/26/22 0705  NA 138 138  K 3.9 4.0  CL 102 109  CO2 23 21*  GLUCOSE  101* 85  BUN 11 9  CREATININE 0.94 0.84  CALCIUM 9.7 8.6*  MG  --  2.2  PHOS  --  3.5   Liver Function Tests: Recent Labs  Lab 06/25/22 1051 06/26/22 0705  AST 24 20  ALT 18 16  ALKPHOS 109 96  BILITOT 1.2 0.7  PROT 8.8* 7.1  ALBUMIN 4.6 3.9   Recent Labs  Lab 06/23/22 0955 06/25/22 1051  LIPASE 143* 82*   No results for input(s): "AMMONIA" in the last 168 hours. CBC: Recent Labs  Lab 06/25/22 1051 06/26/22 0705 06/27/22 0638  WBC 7.5 4.8 5.2  HGB 12.4 11.3* 12.2  HCT 39.1 35.1* 38.9  MCV 84.6 86.0 87.0  PLT 192 128* 176   Cardiac Enzymes: No results for input(s): "CKTOTAL", "CKMB", "CKMBINDEX", "TROPONINI" in the last 168 hours. BNP: Invalid input(s): "POCBNP" CBG: No results for input(s): "GLUCAP" in the last 168 hours. D-Dimer No results for input(s): "DDIMER" in the last 72 hours. Hgb A1c No results for input(s): "HGBA1C" in  the last 72 hours. Lipid Profile Recent Labs    06/26/22 0705  CHOL 205*  HDL 26*  LDLCALC 154*  TRIG 124  CHOLHDL 7.9   Thyroid function studies No results for input(s): "TSH", "T4TOTAL", "T3FREE", "THYROIDAB" in the last 72 hours.  Invalid input(s): "FREET3" Anemia work up No results for input(s): "VITAMINB12", "FOLATE", "FERRITIN", "TIBC", "IRON", "RETICCTPCT" in the last 72 hours. Urinalysis    Component Value Date/Time   COLORURINE YELLOW (A) 06/04/2022 2102   APPEARANCEUR CLEAR (A) 06/04/2022 2102   LABSPEC 1.021 06/04/2022 2102   PHURINE 6.0 06/04/2022 2102   GLUCOSEU NEGATIVE 06/04/2022 2102   HGBUR SMALL (A) 06/04/2022 2102   BILIRUBINUR NEGATIVE 06/04/2022 2102   BILIRUBINUR Negative 01/15/2021 1053   KETONESUR NEGATIVE 06/04/2022 2102   PROTEINUR NEGATIVE 06/04/2022 2102   UROBILINOGEN 0.2 01/15/2021 1053   NITRITE NEGATIVE 06/04/2022 2102   LEUKOCYTESUR NEGATIVE 06/04/2022 2102   Sepsis Labs Recent Labs  Lab 06/25/22 1051 06/26/22 0705 06/27/22 0638  WBC 7.5 4.8 5.2   Microbiology No results found for this or any previous visit (from the past 240 hour(s)).   Time coordinating discharge: Over 30 minutes  SIGNED:   Nolberto Hanlon, MD  [facility] 06/27/2022, 1:43 PM Pager   If 7PM-7AM, please contact night-coverage [email or web address] Password [email or web address]

## 2022-06-27 NOTE — TOC Initial Note (Signed)
Transition of Care [facility]) - Initial/Assessment Note    Patient Details  Name: Kaylee Reyes MRN: [id number] Date of Birth: [birth year]  Transition of [facility]) CM/SW Contact:    Beverly Sessions, RN Phone Number: 06/27/2022, 11:48 AM  Clinical Narrative:                   Transition of Care [facility]) Screening Note   Patient Details  Name: Kaylee Reyes Date of Birth: 1951/06/29   Transition of Care [facility]) CM/SW Contact:    Beverly Sessions, RN Phone Number: 06/27/2022, 11:48 AM    Transition of Care Department [facility]) has reviewed patient and no TOC needs have been identified at this time. We will continue to monitor patient advancement through interdisciplinary progression rounds. If new patient transition needs arise, please place a TOC consult.          Patient Goals and CMS Choice        Expected Discharge Plan and Services                                                Prior Living Arrangements/Services                       Activities of Daily Living Home Assistive Devices/Equipment: None ADL Screening (condition at time of admission) Patient's cognitive ability adequate to safely complete daily activities?: Yes Is the patient deaf or have difficulty hearing?: No Does the patient have difficulty seeing, even when wearing glasses/contacts?: No Does the patient have difficulty concentrating, remembering, or making decisions?: No Patient able to express need for assistance with ADLs?: No Does the patient have difficulty dressing or bathing?: No Independently performs ADLs?: Yes (appropriate for developmental age) Does the patient have difficulty walking or climbing stairs?: No Weakness of Legs: None Weakness of Arms/Hands: None  Permission Sought/Granted                  Emotional Assessment              Admission diagnosis:  Subacute pancreatitis [K85.90] Chronic pancreatitis, unspecified pancreatitis type ([place])  [K86.1] Patient Active Problem List   Diagnosis Date Noted   Subacute pancreatitis 06/26/2022   Abdominal pain 06/25/2022   Nausea 06/25/2022   Dehydration 06/25/2022   Elevated lipase 06/25/2022   Anemia 06/15/2022   Acute pancreatitis 06/05/2022   History of breast cancer 06/05/2022   S/P mastectomy 12/15/2021   Chemotherapy-induced neuropathy ([place]) 08/14/2021   Obesity, Class III, BMI 40-49.9 (morbid obesity) ([place]) 08/06/2019   Menopause 03/23/2017   Rectocele 03/23/2017   Cystocele, midline 03/23/2017   Vaginal atrophy 03/23/2017   Left breast cancer with T3 tumor, >5 cm in greatest dimension ([place])    S/P bilateral mastectomy 10/28/2015   Fatty infiltration of liver 10/01/2015   Gonalgia 10/01/2015   Invasive lobular carcinoma of right breast, stage 2 ([place]) 10/01/2015   Acquired hypothyroidism 09/25/2015   Obesity (BMI 30-39.9) 09/25/2015   Hypercholesterolemia 09/25/2015   Essential hypertension 09/25/2015   Allergic rhinitis 09/25/2015   GERD (gastroesophageal reflux disease) 09/25/2015   Alopecia 09/25/2015   Eczema 09/25/2015   PCP:  Birdie Sons, MD Pharmacy:   [id number] - [place], [place] - [address] AT [facility] OF [address]  [place] [address] Phone: [phone] Fax: [phone]  [facility] [place], [place] [address] [place] [address] Phone: [phone] Fax: [phone]  [facility] [address]), [place] - [place]) [place] [address] Phone: [phone] Fax: [phone]     Social Determinants of Health (SDOH) Interventions    Readmission Risk Interventions     No data to display

## 2022-06-27 NOTE — Progress Notes (Signed)
Discharge instructions were given to the pt and family. Questions were encourage and answered. IV was taken out. Personal belongings were given to the pt. Pt denies pain at this time.

## 2022-06-29 ENCOUNTER — Telehealth: Payer: Self-pay

## 2022-06-29 NOTE — Telephone Encounter (Signed)
Transition Care Management Follow-up Telephone Call Date of discharge and from where: [facility] 06/27/22 How have you been since you were released from the hospital? [facility] [place], husband in hospital too from broken hip Any questions or concerns? No  Items Reviewed: Did the pt receive and understand the discharge instructions provided? Yes  Medications obtained and verified? Yes  Other? No  Any new allergies since your discharge? No  Dietary orders reviewed? No Do you have support at home? Yes   Home Care and Equipment/Supplies: Were home health services ordered? no   Functional Questionnaire: (I = Independent and D = Dependent) ADLs: I  Bathing/Dressing- I  Meal Prep- I  Eating- I  Maintaining continence- I  Transferring/Ambulation- I  Managing Meds- I  Follow up appointments reviewed:  PCP Hospital f/u appt confirmed? Yes  Scheduled to see Linsday Drubel on 8/3 @ [facility] f/u appt confirmed? Yes  Scheduled to see [facility] on 8/2. Are transportation arrangements needed? No  If their condition worsens, is the pt aware to call PCP or go to the Emergency Dept.? Yes Was the patient provided with contact information for the PCP's office or ED? Yes Was to pt encouraged to call back with questions or concerns? Yes

## 2022-07-06 ENCOUNTER — Telehealth: Payer: Self-pay

## 2022-07-06 NOTE — Telephone Encounter (Signed)
Copied from [place] [id number]. Topic: Appointment Scheduling - Scheduling Inquiry for Clinic >> Jul 06, 2022 12:34 PM Ludger Nutting wrote: Pt is requesting her hospital follow up scheduled for 8/3 be a virtual visit. Her husband fell and broke his leg and requires 24hr care so she will not be able to make it in person.Please advise pt.

## 2022-07-07 DIAGNOSIS — K760 Fatty (change of) liver, not elsewhere classified: Secondary | ICD-10-CM | POA: Diagnosis not present

## 2022-07-07 DIAGNOSIS — K8500 Idiopathic acute pancreatitis without necrosis or infection: Secondary | ICD-10-CM | POA: Diagnosis not present

## 2022-07-07 NOTE — Progress Notes (Unsigned)
MyChart Video Visit    Virtual Visit via Video Note   This visit type was conducted due to national recommendations for restrictions regarding the COVID-19 Pandemic (e.g. social distancing) in an effort to limit this patient's exposure and mitigate transmission in our community. This patient is at least at moderate risk for complications without adequate follow up. This format is felt to be most appropriate for this patient at this time. Physical exam was limited by quality of the video and audio technology used for the visit.   Patient location: *** Provider location: [facility]  I discussed the limitations of evaluation and management by telemedicine and the availability of in person appointments. The patient expressed understanding and agreed to proceed.  Patient: Kaylee Reyes   DOB: 1951/02/08   71 y.o. Female  MRN: [id number] Visit Date: 07/08/2022  Today's healthcare provider: Mikey Kirschner, PA-C   No chief complaint on file.  Subjective    HPI  Follow up Hospitalization  Patient was admitted to [facility] on 06/25/22 and discharged on 06/27/22. She was treated for Subacute pancreatitis. Treatment for this included Zofran was given, IV hydration was provided.  Admitted patient for further evaluation and management Telephone follow up was done on 06/29/22 She reports {excellent/good/fair:19992} compliance with treatment. She reports this condition is {resolved/improved/worsened:23923}.  Continue soft diet tomorrow then advance slowly to low fat diet. Nothing greasy, nothing fried.avoid coffee. ----------------------------------------------------------------------------------------- -    Medications: Outpatient Medications Prior to Visit  Medication Sig   atenolol (TENORMIN) 25 MG tablet Take 0.5 tablets (12.5 mg total) by mouth daily with breakfast.   Carboxymeth-Glycerin-Polysorb (REFRESH OPTIVE MEGA-3 OP) Place 1 drop into both eyes daily.   docusate  sodium (COLACE) 100 MG capsule Take 200 mg by mouth daily. 2 in am, 1 at night   fexofenadine (ALLEGRA) 60 MG tablet Take 60 mg by mouth daily.   lansoprazole (PREVACID) 30 MG capsule TAKE 1 CAPSULE BY MOUTH DAILY AT NOON(12:00 PM)   levothyroxine (SYNTHROID) 75 MCG tablet TAKE 1 TABLET DAILY   meclizine (ANTIVERT) 12.5 MG tablet TAKE 1 TABLET(12.5 MG) BY MOUTH THREE TIMES DAILY AS NEEDED FOR DIZZINESS (Patient not taking: Reported on 06/25/2022)   ondansetron (ZOFRAN) 4 MG tablet Take 1 tablet (4 mg total) by mouth every 8 (eight) hours as needed for nausea or vomiting.   triamcinolone (KENALOG) 0.025 % cream Apply 1 Application topically 2 (two) times daily.   Vitamin D, Ergocalciferol, (DRISDOL) 1.25 MG (50000 UNIT) CAPS capsule TAKE 1 CAPSULE BY MOUTH 1 TIME A WEEK   No facility-administered medications prior to visit.    Review of Systems  {Labs  Heme  Chem  Endocrine  Serology  Results Review (optional):23779}   Objective    There were no vitals taken for this visit.  {Show previous vital signs (optional):23777}   Physical Exam     Assessment & Plan     ***  No follow-ups on file.     I discussed the assessment and treatment plan with the patient. The patient was provided an opportunity to ask questions and all were answered. The patient agreed with the plan and demonstrated an understanding of the instructions.   The patient was advised to call back or seek an in-person evaluation if the symptoms worsen or if the condition fails to improve as anticipated.  I provided *** minutes of non-face-to-face time during this encounter.  {provider attestation***:1}  Mikey Kirschner, PA-C [facility] [phone] (phone) [phone] (fax)  [place]

## 2022-07-08 ENCOUNTER — Encounter: Payer: Self-pay | Admitting: Physician Assistant

## 2022-07-08 ENCOUNTER — Telehealth (INDEPENDENT_AMBULATORY_CARE_PROVIDER_SITE_OTHER): Payer: Medicare Other | Admitting: Physician Assistant

## 2022-07-08 VITALS — BP 137/64 | HR 65

## 2022-07-08 DIAGNOSIS — I10 Essential (primary) hypertension: Secondary | ICD-10-CM | POA: Diagnosis not present

## 2022-07-08 DIAGNOSIS — K859 Acute pancreatitis without necrosis or infection, unspecified: Secondary | ICD-10-CM

## 2022-07-08 NOTE — Assessment & Plan Note (Signed)
Now followed by GI for etiology, asymptomatic

## 2022-07-08 NOTE — Assessment & Plan Note (Signed)
>>  ASSESSMENT AND PLAN FOR ESSENTIAL HYPERTENSION WRITTEN ON 07/08/2022  1:02 PM BY DRUBEL, LINDSAY, PA-C  In range at home with d/c of hctz and lower dose of atenolol  Advised pt to monitor , monitor leg swelling as well Watch fluid intake/ salt intake

## 2022-07-08 NOTE — Assessment & Plan Note (Signed)
In range at home with d/c of hctz and lower dose of atenolol Advised pt to monitor , monitor leg swelling as well Watch fluid intake/ salt intake

## 2022-07-10 ENCOUNTER — Ambulatory Visit
Admission: EM | Admit: 2022-07-10 | Discharge: 2022-07-10 | Disposition: A | Discharge status: Home / Self Care | Payer: Medicare Other | Attending: Emergency Medicine | Admitting: Emergency Medicine

## 2022-07-10 ENCOUNTER — Other Ambulatory Visit: Payer: Self-pay

## 2022-07-10 ENCOUNTER — Encounter: Payer: Self-pay | Admitting: Emergency Medicine

## 2022-07-10 DIAGNOSIS — J0100 Acute maxillary sinusitis, unspecified: Secondary | ICD-10-CM

## 2022-07-10 MED ORDER — FLUTICASONE PROPIONATE 50 MCG/ACT NA SUSP: 1.0000 | Freq: Every day | NASAL | 0 refills | Status: DC

## 2022-07-10 MED ORDER — AZITHROMYCIN 250 MG PO TABS: 250.0000 mg | ORAL_TABLET | Freq: Every day | ORAL | 0 refills | Status: DC

## 2022-07-10 NOTE — ED Provider Notes (Signed)
[facility]    CSN: [id number] Arrival date & time: 07/10/22  1032      History   Chief Complaint Chief Complaint  Patient presents with   Nasal Congestion    HPI ALANNI VADER is a 71 y.o. female.   Patient presents with subjective low-grade fevers, nasal congestion, rhinorrhea, sinus pain and pressure, bilateral ear fullness and a scratchy throat, postnasal drip and a mild nonproductive cough for 7 days.  Has attempted use of Delsym, Atrovent nasal spray and cough drops which have been minimally effective.  History of seasonal allergies.  No known sick contacts.  Tolerating food and liquids.  Past Medical History:  Diagnosis Date   Allergy    Notes in my chart   Anemia    Arthritis 2016   Noted from Mri and xrays   Body tinea 10/01/2015   Previously treated by Dr. Koleen Nimrod    Cancer [facility]) 09/2015   Had full mascetomy   COVID-19 02/23/2021   MAB infusion 01/23/2021   GERD (gastroesophageal reflux disease)    History of breast cancer 09/2015   breast cancer   History of chicken pox    History of peptic ulcer disease 09/25/2015   Hyperlipidemia    Hypertension    Hypothyroidism    Neuropathy of foot    bilateral   Pinched nerve    PONV (postoperative nausea and vomiting)    exploratory surgery at [facility]   Thyroid disease    Tuberculosis exposure    Mother had TB.  Pt shows (+) on tests, but has never had.   Vertigo    last episode over 1 yr ago   Wears dentures    partial upper and lower    Patient Active Problem List   Diagnosis Date Noted   Subacute pancreatitis 06/26/2022   Abdominal pain 06/25/2022   Nausea 06/25/2022   Dehydration 06/25/2022   Elevated lipase 06/25/2022   Anemia 06/15/2022   Acute pancreatitis 06/05/2022   History of breast cancer 06/05/2022   S/P mastectomy 12/15/2021   Chemotherapy-induced neuropathy ([place]) 08/14/2021   Obesity, Class III, BMI 40-49.9 (morbid obesity) ([place]) 08/06/2019   Menopause 03/23/2017    Rectocele 03/23/2017   Cystocele, midline 03/23/2017   Vaginal atrophy 03/23/2017   Left breast cancer with T3 tumor, >5 cm in greatest dimension ([place])    S/P bilateral mastectomy 10/28/2015   Fatty infiltration of liver 10/01/2015   Gonalgia 10/01/2015   Invasive lobular carcinoma of right breast, stage 2 ([place]) 10/01/2015   Acquired hypothyroidism 09/25/2015   Obesity (BMI 30-39.9) 09/25/2015   Hypercholesterolemia 09/25/2015   Essential hypertension 09/25/2015   Allergic rhinitis 09/25/2015   GERD (gastroesophageal reflux disease) 09/25/2015   Alopecia 09/25/2015   Eczema 09/25/2015    Past Surgical History:  Procedure Laterality Date   ABDOMINAL HYSTERECTOMY  07/13/2013   Total. with BSO for postmenopausal bleeding and fibroids with large cervical polyp. Dr. Levy Sjogren and Dr. Glennon Mac at [place]  04/17/2011   Hepatomegaly with borderline slenomegaly. Suggestive of fatty liver. s/p cholecystectomy. Portions of aorta obscured   BREAST SURGERY Right 2011   BREAST BIOPSY   CESAREAN SECTION  1988   CHOLECYSTECTOMY  1990   COLONOSCOPY WITH PROPOFOL N/A 09/01/2017   Procedure: COLONOSCOPY WITH PROPOFOL;  Surgeon: [name], MD;  Location: [place];  Service: Gastroenterology;  Laterality: N/A;   DIAGNOSTIC LAPAROSCOPY     ESOPHAGOGASTRODUODENOSCOPY (EGD) WITH PROPOFOL N/A 01/17/2020   Procedure: ESOPHAGOGASTRODUODENOSCOPY (EGD)  WITH PROPOFOL;  Surgeon: [name], MD;  Location: [facility] ENDOSCOPY;  Service: Endoscopy;  Laterality: N/A;   EXPLORATORY LAPAROTOMY     Left breast   MASTECTOMY     MASTECTOMY W/ SENTINEL NODE BIOPSY Bilateral 10/28/2015   Procedure: MASTECTOMY WITH SENTINEL LYMPH NODE BIOPSY;  Surgeon: Hubbard Robinson, MD;  Location: [facility] ORS;  Service: General;  Laterality: Bilateral;   PORT-A-CATH REMOVAL Left 10/11/2016   Procedure: REMOVAL PORT-A-CATH;  Surgeon: Hubbard Robinson, MD;  Location: [facility];  Service: General;  Laterality:  Left;   PORTACATH PLACEMENT Left 12/23/2015   Procedure: INSERTION PORT-A-CATH;  Surgeon: Hubbard Robinson, MD;  Location: [facility] OR[facility];  Service: General;  Laterality: Left;   WRIST SURGERY      OB History     Gravida  3   Para  2   Term  2   Preterm      AB  1   Living  2      SAB  1   IAB      Ectopic      Multiple      Live Births  2            Home Medications    Prior to Admission medications   Medication Sig Start Date End Date Taking? Authorizing Provider  atenolol (TENORMIN) 25 MG tablet Take 0.5 tablets (12.5 mg total) by mouth daily with breakfast. 06/28/22 07/28/22 Yes Amery, Gwynneth Albright, MD  Carboxymeth-Glycerin-Polysorb (REFRESH OPTIVE MEGA-3 OP) Place 1 drop into both eyes daily.   Yes [provider]  docusate sodium (COLACE) 100 MG capsule Take 200 mg by mouth daily. 2 in am, 1 at night   Yes [provider]  fexofenadine (ALLEGRA) 60 MG tablet Take 60 mg by mouth daily.   Yes [provider]  lansoprazole (PREVACID) 30 MG capsule TAKE 1 CAPSULE BY MOUTH DAILY AT NOON(12:00 PM) 01/07/22  Yes Birdie Sons, MD  levothyroxine (SYNTHROID) 75 MCG tablet TAKE 1 TABLET DAILY 02/03/22  Yes Birdie Sons, MD  meclizine (ANTIVERT) 12.5 MG tablet TAKE 1 TABLET(12.5 MG) BY MOUTH THREE TIMES DAILY AS NEEDED FOR DIZZINESS 08/20/21  Yes Birdie Sons, MD  ondansetron (ZOFRAN) 4 MG tablet Take 1 tablet (4 mg total) by mouth every 8 (eight) hours as needed for nausea or vomiting. 06/25/22  Yes Drubel, Ria Comment, PA-C  triamcinolone (KENALOG) 0.025 % cream Apply 1 Application topically 2 (two) times daily. 05/17/22  Yes [provider]  Vitamin D, Ergocalciferol, (DRISDOL) 1.25 MG (50000 UNIT) CAPS capsule TAKE 1 CAPSULE BY MOUTH 1 TIME A WEEK 02/16/22  Yes Finnegan, Kathlene November, MD  fluticasone (FLONASE) 50 MCG/ACT nasal spray Place 2 sprays into both nostrils daily. 01/21/21 01/21/21  Rodriguez-Southworth, Sunday Spillers, PA-C    Family  History Family History  Problem Relation Age of Onset   Hypertension Mother    Thyroid disease Mother    Hypertension Brother    Diabetes Brother    Breast cancer Neg Hx    Ovarian cancer Neg Hx    Colon cancer Neg Hx     Social History Social History   Tobacco Use   Smoking status: Never   Smokeless tobacco: Never  Vaping Use   Vaping Use: Never used  Substance Use Topics   Alcohol use: Never   Drug use: Never     Allergies   Penicillins, Nickel, Keflex [cephalexin], and Pravastatin sodium   Review of Systems Review of Systems Defer to HPI  Physical Exam Triage Vital Signs ED Triage Vitals  Enc Vitals Group     BP 07/10/22 1045 (!) 142/79     Pulse Rate 07/10/22 1045 86     Resp 07/10/22 1045 18     Temp 07/10/22 1045 99.1 F (37.3 C)     Temp Source 07/10/22 1045 Oral     SpO2 07/10/22 1045 94 %     Weight 07/10/22 1043 268 lb 1.3 oz (121.6 kg)     Height 07/10/22 1043 '5\' 6"'$  (1.676 m)     Head Circumference --      Peak Flow --      Pain Score 07/10/22 1042 2     Pain Loc --      Pain Edu? --      Excl. in [place]? --    No data found.  Updated Vital Signs BP (!) 142/79 (BP Location: Left Arm)   Pulse 86   Temp 99.1 F (37.3 C) (Oral)   Resp 18   Ht '5\' 6"'$  (1.676 m)   Wt 268 lb 1.3 oz (121.6 kg)   SpO2 94%   BMI 43.27 kg/m   Visual Acuity Right Eye Distance:   Left Eye Distance:   Bilateral Distance:    Right Eye Near:   Left Eye Near:    Bilateral Near:     Physical Exam Constitutional:      Appearance: Normal appearance.  HENT:     Head: Normocephalic.     Right Ear: Tympanic membrane, ear canal and external ear normal.     Left Ear: Tympanic membrane, ear canal and external ear normal.     Nose: Congestion and rhinorrhea present.     Right Sinus: Maxillary sinus tenderness present. No frontal sinus tenderness.     Left Sinus: Maxillary sinus tenderness present. No frontal sinus tenderness.     Mouth/Throat:     Mouth: Mucous  membranes are moist.     Pharynx: Posterior oropharyngeal erythema present.     Tonsils: No tonsillar exudate. 0 on the right. 0 on the left.  Eyes:     Extraocular Movements: Extraocular movements intact.  Cardiovascular:     Rate and Rhythm: Normal rate and regular rhythm.     Pulses: Normal pulses.     Heart sounds: Normal heart sounds.  Pulmonary:     Effort: Pulmonary effort is normal.     Breath sounds: Normal breath sounds.  Skin:    General: Skin is warm and dry.  Neurological:     Mental Status: She is alert and oriented to person, place, and time. Mental status is at baseline.  Psychiatric:        Mood and Affect: Mood normal.        Behavior: Behavior normal.      UC Treatments / Results  Labs (all labs ordered are listed, but only abnormal results are displayed) Labs Reviewed - No data to display  EKG   Radiology No results found.  Procedures Procedures (including critical care time)  Medications Ordered in UC Medications - No data to display  Initial Impression / Assessment and Plan / UC Course  I have reviewed the triage vital signs and the nursing notes.  Pertinent labs & imaging results that were available during my care of the patient were reviewed by me and considered in my medical decision making (see chart for details).  Acute nonrecurrent maxillary sinusitis  Vital signs are stable patient is in no signs of  distress, presentation and symptomology is consistent with a sinusitis and as symptoms have been present for 7 days with no signs of resolution we will provide bacterial coverage, endorses that she does not want to take the doxycyline therefore azithromycin prescribed as well as Flonase for additional support, patient endorses that her Delma Freeze is no longer effective in managing her allergies, recommended change to a different antihistamine such as Zyrtec, may continue use of Delsym as needed and recommended additional Mucinex for supportive care,  given strict precautions for persisting or worsening symptoms follow-up with urgent care or PCP as needed Final Clinical Impressions(s) / UC Diagnoses   Final diagnoses:  None   Discharge Instructions   None    ED Prescriptions   None    PDMP not reviewed this encounter.   Hans Eden, NP 07/10/22 1104

## 2022-07-10 NOTE — ED Triage Notes (Signed)
Pt c/o nasal congestion, runny nose, sinus pain/pressure, ear pressure and cough. Started about a week ago. She states her sputum has went from clear to dark brown and she feels like she is getting worse each day.

## 2022-07-10 NOTE — Discharge Instructions (Signed)
Your symptoms today are consistent with a sinus infection  Take azithromycin to the packaging to clear any bacteria that may be aiding to your symptoms  Take Flonase every morning, this medication is a steroid nasal spray which helps to loosen secretions out of the sinus passageway as well as to reduce the amount of secretions present  Take Mucinex, this medication helps to thin secretions allowing them to drain   may switch your antihistamine from Allegra to Zyrtec, this medication reduces the amount of secretions that the body will produce  May continue use of Delsym as needed  Please follow-up with urgent care or your primary doctor if your symptoms persist or worsen

## 2022-07-12 ENCOUNTER — Other Ambulatory Visit (HOSPITAL_COMMUNITY): Payer: Self-pay | Admitting: Gastroenterology

## 2022-07-12 ENCOUNTER — Other Ambulatory Visit: Payer: Self-pay | Admitting: Gastroenterology

## 2022-07-12 DIAGNOSIS — K8500 Idiopathic acute pancreatitis without necrosis or infection: Secondary | ICD-10-CM

## 2022-07-25 ENCOUNTER — Other Ambulatory Visit: Payer: Self-pay | Admitting: General Surgery

## 2022-07-25 DIAGNOSIS — K858 Other acute pancreatitis without necrosis or infection: Secondary | ICD-10-CM

## 2022-07-26 ENCOUNTER — Ambulatory Visit
Admission: RE | Admit: 2022-07-26 | Discharge: 2022-07-26 | Disposition: A | Discharge status: Home / Self Care | Payer: Medicare Other | Source: Ambulatory Visit | Attending: Gastroenterology | Admitting: Gastroenterology

## 2022-07-26 DIAGNOSIS — K858 Other acute pancreatitis without necrosis or infection: Secondary | ICD-10-CM | POA: Diagnosis not present

## 2022-07-26 DIAGNOSIS — K8500 Idiopathic acute pancreatitis without necrosis or infection: Secondary | ICD-10-CM | POA: Insufficient documentation

## 2022-07-26 DIAGNOSIS — R935 Abnormal findings on diagnostic imaging of other abdominal regions, including retroperitoneum: Secondary | ICD-10-CM | POA: Diagnosis not present

## 2022-07-26 DIAGNOSIS — K859 Acute pancreatitis without necrosis or infection, unspecified: Secondary | ICD-10-CM | POA: Diagnosis not present

## 2022-07-26 DIAGNOSIS — R160 Hepatomegaly, not elsewhere classified: Secondary | ICD-10-CM | POA: Diagnosis not present

## 2022-07-26 DIAGNOSIS — K760 Fatty (change of) liver, not elsewhere classified: Secondary | ICD-10-CM | POA: Diagnosis not present

## 2022-07-26 MED ORDER — GADOBUTROL 1 MMOL/ML IV SOLN
10.0000 mL | Freq: Once | INTRAVENOUS | Status: AC | PRN
  Administered 2022-07-26: 10 mL via INTRAVENOUS

## 2022-08-06 ENCOUNTER — Other Ambulatory Visit: Payer: Self-pay | Admitting: Family Medicine

## 2022-08-06 ENCOUNTER — Ambulatory Visit: Payer: Medicare Other | Admitting: Oncology

## 2022-08-10 ENCOUNTER — Ambulatory Visit: Payer: Medicare Other | Admitting: Physician Assistant

## 2022-09-08 ENCOUNTER — Other Ambulatory Visit: Payer: Self-pay | Admitting: Family Medicine

## 2022-09-16 ENCOUNTER — Ambulatory Visit (INDEPENDENT_AMBULATORY_CARE_PROVIDER_SITE_OTHER): Payer: Medicare Other

## 2022-09-16 VITALS — Ht 66.0 in | Wt 268.0 lb

## 2022-09-16 DIAGNOSIS — Z0000 Encounter for general adult medical examination without abnormal findings: Secondary | ICD-10-CM

## 2022-09-16 NOTE — Progress Notes (Signed)
Virtual Visit via Telephone Note  I connected with  Kaylee Reyes on 09/16/22 at  8:15 AM EDT by telephone and verified that I am speaking with the correct person using two identifiers.  Location: Patient: home Provider: [facility] Persons participating in the virtual visit: [place]   I discussed the limitations, risks, security and privacy concerns of performing an evaluation and management service by telephone and the availability of in person appointments. The patient expressed understanding and agreed to proceed.  Interactive audio and video telecommunications were attempted between this nurse and patient, however failed, due to patient having technical difficulties OR patient did not have access to video capability.  We continued and completed visit with audio only.  Some vital signs may be absent or patient reported.   Dionisio David, LPN  Subjective:   Kaylee Reyes is a 71 y.o. female who presents for Medicare Annual (Subsequent) preventive examination.  Review of Systems     Cardiac Risk Factors include: advanced age (>23mn, >>11women);dyslipidemia;hypertension;obesity (BMI >30kg/m2)     Objective:    There were no vitals filed for this visit. There is no height or weight on file to calculate BMI.     09/16/2022    8:17 AM 07/10/2022   10:45 AM 06/25/2022    7:27 PM 06/25/2022   10:35 AM 06/04/2022    6:57 PM 09/11/2021    8:22 AM 09/05/2021    3:51 PM  Advanced Directives  Does Patient Have a Medical Advance Directive? No No Yes Yes No No No  Type of Advance Directive   Living will      Does patient want to make changes to medical advance directive?   No - Patient declined      Would patient like information on creating a medical advance directive? No - Patient declined  No - Patient declined  No - Patient declined      Current Medications (verified) Outpatient Encounter Medications as of 09/16/2022  Medication Sig   atenolol (TENORMIN) 25  MG tablet TAKE 1 TABLET IN THE MORNING. TAKE IN ADDITION TO 50 MG IN THE MORNING   docusate sodium (COLACE) 100 MG capsule Take 200 mg by mouth daily. 2 in am, 1 at night   fexofenadine (ALLEGRA) 60 MG tablet Take 60 mg by mouth daily.   fluticasone (FLONASE) 50 MCG/ACT nasal spray Place 1 spray into both nostrils daily.   lansoprazole (PREVACID) 30 MG capsule TAKE 1 CAPSULE DAILY AT 12 NOON (NEED OFFICE VISIT PRIOR TO FURTHER REFILLS)   levothyroxine (SYNTHROID) 75 MCG tablet TAKE 1 TABLET DAILY   meclizine (ANTIVERT) 12.5 MG tablet TAKE 1 TABLET(12.5 MG) BY MOUTH THREE TIMES DAILY AS NEEDED FOR DIZZINESS   triamcinolone (KENALOG) 0.025 % cream Apply 1 Application topically 2 (two) times daily.   Vitamin D, Ergocalciferol, (DRISDOL) 1.25 MG (50000 UNIT) CAPS capsule TAKE 1 CAPSULE BY MOUTH 1 TIME A WEEK   atenolol (TENORMIN) 50 MG tablet Take by mouth. (Patient not taking: Reported on 09/16/2022)   azithromycin (ZITHROMAX) 250 MG tablet Take 1 tablet (250 mg total) by mouth daily. Take first 2 tablets together, then 1 every day until finished. (Patient not taking: Reported on 09/16/2022)   Carboxymeth-Glycerin-Polysorb (REFRESH OPTIVE MEGA-3 OP) Place 1 drop into both eyes daily. (Patient not taking: Reported on 09/16/2022)   hydrochlorothiazide (HYDRODIURIL) 25 MG tablet Take 25 mg by mouth daily. (Patient not taking: Reported on 09/16/2022)   ondansetron (ZOFRAN) 4 MG tablet Take 1  tablet (4 mg total) by mouth every 8 (eight) hours as needed for nausea or vomiting. (Patient not taking: Reported on 09/16/2022)   No facility-administered encounter medications on file as of 09/16/2022.    Allergies (verified) Penicillins, Nickel, Keflex [cephalexin], and Pravastatin sodium   History: Past Medical History:  Diagnosis Date   Allergy    Notes in my chart   Anemia    Arthritis 2016   Noted from Mri and xrays   Body tinea 10/01/2015   Previously treated by Dr. Koleen Nimrod    Cancer [facility])  09/2015   Had full mascetomy   COVID-19 02/23/2021   MAB infusion 01/23/2021   GERD (gastroesophageal reflux disease)    History of breast cancer 09/2015   breast cancer   History of chicken pox    History of peptic ulcer disease 09/25/2015   Hyperlipidemia    Hypertension    Hypothyroidism    Neuropathy of foot    bilateral   Pinched nerve    PONV (postoperative nausea and vomiting)    exploratory surgery at [facility]   Thyroid disease    Tuberculosis exposure    Mother had TB.  Pt shows (+) on tests, but has never had.   Vertigo    last episode over 1 yr ago   Wears dentures    partial upper and lower   Past Surgical History:  Procedure Laterality Date   ABDOMINAL HYSTERECTOMY  07/13/2013   Total. with BSO for postmenopausal bleeding and fibroids with large cervical polyp. Dr. Levy Sjogren and Dr. Glennon Mac at [facility]  04/17/2011   Hepatomegaly with borderline slenomegaly. Suggestive of fatty liver. s/p cholecystectomy. Portions of aorta obscured   BREAST SURGERY Right 2011   BREAST BIOPSY   CESAREAN SECTION  1988   CHOLECYSTECTOMY  1990   COLONOSCOPY WITH PROPOFOL N/A 09/01/2017   Procedure: COLONOSCOPY WITH PROPOFOL;  Surgeon: [name], MD;  Location: [place];  Service: Gastroenterology;  Laterality: N/A;   DIAGNOSTIC LAPAROSCOPY     ESOPHAGOGASTRODUODENOSCOPY (EGD) WITH PROPOFOL N/A 01/17/2020   Procedure: ESOPHAGOGASTRODUODENOSCOPY (EGD) WITH PROPOFOL;  Surgeon: [name], MD;  Location: [facility];  Service: Endoscopy;  Laterality: N/A;   EXPLORATORY LAPAROTOMY     Left breast   MASTECTOMY     MASTECTOMY W/ SENTINEL NODE BIOPSY Bilateral 10/28/2015   Procedure: MASTECTOMY WITH SENTINEL LYMPH NODE BIOPSY;  Surgeon: Hubbard Robinson, MD;  Location: [facility];  Service: General;  Laterality: Bilateral;   PORT-A-CATH REMOVAL Left 10/11/2016   Procedure: REMOVAL PORT-A-CATH;  Surgeon: Hubbard Robinson, MD;  Location: [facility];  Service:  General;  Laterality: Left;   PORTACATH PLACEMENT Left 12/23/2015   Procedure: INSERTION PORT-A-CATH;  Surgeon: Hubbard Robinson, MD;  Location: [facility];  Service: General;  Laterality: Left;   WRIST SURGERY     Family History  Problem Relation Age of Onset   Hypertension Mother    Thyroid disease Mother    Hypertension Brother    Diabetes Brother    Breast cancer Neg Hx    Ovarian cancer Neg Hx    Colon cancer Neg Hx    Social History   Socioeconomic History   Marital status: Married    Spouse name: Not on file   Number of children: 2   Years of education: Not on file   Highest education level: Some college, no degree  Occupational History   Occupation: retired  Tobacco Use   Smoking status: Never  Smokeless tobacco: Never  Vaping Use   Vaping Use: Never used  Substance and Sexual Activity   Alcohol use: Never   Drug use: Never   Sexual activity: Not Currently    Birth control/protection: Post-menopausal  Other Topics Concern   Not on file  Social History Narrative   Not on file   Social Determinants of Health   Financial Resource Strain: Low Risk  (09/16/2022)   Overall Financial Resource Strain (CARDIA)    Difficulty of Paying Living Expenses: Not very hard  Food Insecurity: No Food Insecurity (09/16/2022)   Hunger Vital Sign    Worried About Running Out of Food in the Last Year: Never true    Ran Out of Food in the Last Year: Never true  Transportation Needs: No Transportation Needs (09/16/2022)   PRAPARE - Hydrologist (Medical): No    Lack of Transportation (Non-Medical): No  Physical Activity: Insufficiently Active (09/16/2022)   Exercise Vital Sign    Days of Exercise per Week: 2 days    Minutes of Exercise per Session: 20 min  Stress: No Stress Concern Present (09/16/2022)   [place]    Feeling of Stress : Only a little  Social Connections: Moderately  Integrated (09/16/2022)   Social Connection and Isolation Panel [NHANES]    Frequency of Communication with Friends and Family: More than three times a week    Frequency of Social Gatherings with Friends and Family: Twice a week    Attends Religious Services: More than 4 times per year    Active Member of [employer] or Organizations: No    Attends Music therapist: Never    Marital Status: Married    Tobacco Counseling Counseling given: Not Answered   Clinical Intake:  Pre-visit preparation completed: Yes  Pain : No/denies pain     Nutritional Risks: None Diabetes: No  How often do you need to have someone help you when you read instructions, pamphlets, or other written materials from your doctor or pharmacy?: 1 - Never  Diabetic?no  Interpreter Needed?: No  Information entered by :: Kirke Shaggy, LPN   Activities of Daily Living    09/16/2022    8:18 AM 06/25/2022    7:27 PM  In your present state of health, do you have any difficulty performing the following activities:  Hearing? 0 0  Vision? 0 0  Difficulty concentrating or making decisions? 0 0  Walking or climbing stairs? 0 0  Dressing or bathing? 0 0  Doing errands, shopping? 0 0  Preparing Food and eating ? N   Using the Toilet? N   In the past six months, have you accidently leaked urine? N   Do you have problems with loss of bowel control? N   Managing your Medications? N   Managing your Finances? N   Housekeeping or managing your Housekeeping? N     Patient Care Team: Birdie Sons, MD as PCP - General (Family Medicine) Rockey Situ Kathlene November, MD as PCP - Cardiology (Cardiology) [name], MD as Consulting Physician (Gastroenterology) Diamond Nickel, DO as Consulting Physician (Sports Medicine) Sharlet Salina, MD as Referring Physician (Physical Medicine and Rehabilitation) Olean Ree, MD as Consulting Physician (General Surgery) Lloyd Huger, MD as Consulting Physician  (Oncology) Anell Barr, OD (Optometry) Rubie Maid, MD as Referring Physician (Obstetrics and Gynecology)  Indicate any recent Medical Services you may have received from other than [facility]  providers in the past year (date may be approximate).     Assessment:   This is a routine wellness examination for Kaylee Reyes.  Hearing/Vision screen Hearing Screening - Comments:: No aids Vision Screening - Comments:: Wears glasses- Dr.Woodard  Dietary issues and exercise activities discussed: Current Exercise Habits: Home exercise routine, Type of exercise: walking, Time (Minutes): 20, Frequency (Times/Week): 2, Weekly Exercise (Minutes/Week): 40, Intensity: Mild   Goals Addressed             This Visit's Progress    DIET - EAT MORE FRUITS AND VEGETABLES         Depression Screen    09/16/2022    8:16 AM 09/15/2021    9:19 AM 06/15/2021    2:56 PM 01/15/2021   11:23 AM 07/15/2020   11:38 AM 07/25/2019   11:03 AM 11/10/2015    1:49 PM  PHQ 2/9 Scores  PHQ - 2 Score 0 0 0 0 0 0 0  PHQ- 9 Score 0 0 0 1 0      Fall Risk    09/16/2022    8:17 AM 09/15/2021    9:19 AM 06/15/2021    2:56 PM 01/15/2021   11:22 AM 07/29/2020   11:12 AM  Fall Risk   Falls in the past year? 0 0 0 0 1  Comment     Due to a chair collapsing.  Number falls in past yr: 0 0 0 0 0  Injury with Fall? 0 0 0 0 0  Risk for fall due to : No Fall Risks No Fall Risks No Fall Risks No Fall Risks   Follow up Falls prevention discussed;Falls evaluation completed  Falls evaluation completed Follow up appointment Falls prevention discussed    FALL RISK PREVENTION PERTAINING TO THE HOME:  Any stairs in or around the home? No  If so, are there any without handrails? No  Home free of loose throw rugs in walkways, pet beds, electrical cords, etc? Yes  Adequate lighting in your home to reduce risk of falls? Yes   ASSISTIVE DEVICES UTILIZED TO PREVENT FALLS:  Life alert? No  Use of a cane, walker or w/c? No  Grab bars  in the bathroom? No  Shower chair or bench in shower? No  Elevated toilet seat or a handicapped toilet? Yes    Cognitive Function:        09/16/2022    8:19 AM  6CIT Screen  What Year? 0 points  What month? 0 points  What time? 0 points  Count back from 20 0 points  Months in reverse 0 points  Repeat phrase 0 points  Total Score 0 points    Immunizations Immunization History  Administered Date(s) Administered   Influenza Split 09/11/2010, 08/25/2011, 08/28/2013   Tdap 08/25/2011    TDAP status: Due, Education has been provided regarding the importance of this vaccine. Advised may receive this vaccine at local pharmacy or Health Dept. Aware to provide a copy of the vaccination record if obtained from local pharmacy or Health Dept. Verbalized acceptance and understanding.  Flu Vaccine status: Due, Education has been provided regarding the importance of this vaccine. Advised may receive this vaccine at local pharmacy or Health Dept. Aware to provide a copy of the vaccination record if obtained from local pharmacy or Health Dept. Verbalized acceptance and understanding.  Pneumococcal vaccine status: Declined,  Education has been provided regarding the importance of this vaccine but patient still declined. Advised may receive this vaccine at local  pharmacy or Health Dept. Aware to provide a copy of the vaccination record if obtained from local pharmacy or Health Dept. Verbalized acceptance and understanding.   Covid-19 vaccine status: Declined, Education has been provided regarding the importance of this vaccine but patient still declined. Advised may receive this vaccine at local pharmacy or Health Dept.or vaccine clinic. Aware to provide a copy of the vaccination record if obtained from local pharmacy or Health Dept. Verbalized acceptance and understanding.  Qualifies for Shingles Vaccine? Yes   Zostavax completed No   Shingrix Completed?: No.    Education has been provided  regarding the importance of this vaccine. Patient has been advised to call insurance company to determine out of pocket expense if they have not yet received this vaccine. Advised may also receive vaccine at local pharmacy or Health Dept. Verbalized acceptance and understanding.  Screening Tests Health Maintenance  Topic Date Due   COVID-19 Vaccine (1) Never done   Zoster Vaccines- Shingrix (1 of 2) Never done   Pneumonia Vaccine 17+ Years old (1 - PCV) Never done   TETANUS/TDAP  08/24/2021   INFLUENZA VACCINE  07/06/2022   COLONOSCOPY (Pts 45-65yr Insurance coverage will need to be confirmed)  09/02/2027   DEXA SCAN  Completed   Hepatitis C Screening  Completed   HPV VACCINES  Aged Out   MAMMOGRAM  Discontinued    Health Maintenance  Health Maintenance Due  Topic Date Due   COVID-19 Vaccine (1) Never done   Zoster Vaccines- Shingrix (1 of 2) Never done   Pneumonia Vaccine 70 Years old (1 - PCV) Never done   TETANUS/TDAP  08/24/2021   INFLUENZA VACCINE  07/06/2022    Colorectal cancer screening: Type of screening: Colonoscopy. Completed 09/01/17. Repeat every 10 years  Mammogram status: No longer required due to mastectomy.  Bone Density status: Completed 07/07/20. Results reflect: Bone density results: NORMAL. Repeat every 5 years.  Lung Cancer Screening: (Low Dose CT Chest recommended if Age 70-80years, 30 pack-year currently smoking OR have quit w/in 15years.) does not qualify.   Additional Screening:  Hepatitis C Screening: does qualify; Completed 07/06/16  Vision Screening: Recommended annual ophthalmology exams for early detection of glaucoma and other disorders of the eye. Is the patient up to date with their annual eye exam?  Yes  Who is the provider or what is the name of the office in which the patient attends annual eye exams? Dr.Woodard If pt is not established with a provider, would they like to be referred to a provider to establish care? No .   Dental  Screening: Recommended annual dental exams for proper oral hygiene  Community Resource Referral / Chronic Care Management: CRR required this visit?  No   CCM required this visit?  No      Plan:     I have personally reviewed and noted the following in the patient's chart:   Medical and social history Use of alcohol, tobacco or illicit drugs  Current medications and supplements including opioid prescriptions. Patient is not currently taking opioid prescriptions. Functional ability and status Nutritional status Physical activity Advanced directives List of other physicians Hospitalizations, surgeries, and ER visits in previous 12 months Vitals Screenings to include cognitive, depression, and falls Referrals and appointments  In addition, I have reviewed and discussed with patient certain preventive protocols, quality metrics, and best practice recommendations. A written personalized care plan for preventive services as well as general preventive health recommendations were provided to patient.  Dionisio David, LPN   [id number]   Nurse Notes: none

## 2022-09-16 NOTE — Patient Instructions (Signed)
Kaylee Reyes , Thank you for taking time to come for your Medicare Wellness Visit. I appreciate your ongoing commitment to your health goals. Please review the following plan we discussed and let me know if I can assist you in the future.   Screening recommendations/referrals: Colonoscopy: 09/01/17 Mammogram: not required mastectomy Bone Density: 07/07/20 Recommended yearly ophthalmology/optometry visit for glaucoma screening and checkup Recommended yearly dental visit for hygiene and checkup  Vaccinations: Influenza vaccine: n/d Pneumococcal vaccine: n/d Tdap vaccine: 08/25/11, due if have injury Shingles vaccine: n/d   Covid-19:n/d  Advanced directives: no  Conditions/risks identified: none  Next appointment: Follow up in one year for your annual wellness visit 09/19/23 @ 8:15 am by phone   Preventive Care 46 Years and Older, Female Preventive care refers to lifestyle choices and visits with your health care provider that can promote health and wellness. What does preventive care include? A yearly physical exam. This is also called an annual well check. Dental exams once or twice a year. Routine eye exams. Ask your health care provider how often you should have your eyes checked. Personal lifestyle choices, including: Daily care of your teeth and gums. Regular physical activity. Eating a healthy diet. Avoiding tobacco and drug use. Limiting alcohol use. Practicing safe sex. Taking low-dose aspirin every day. Taking vitamin and mineral supplements as recommended by your health care provider. What happens during an annual well check? The services and screenings done by your health care provider during your annual well check will depend on your age, overall health, lifestyle risk factors, and family history of disease. Counseling  Your health care provider may ask you questions about your: Alcohol use. Tobacco use. Drug use. Emotional well-being. Home and relationship  well-being. Sexual activity. Eating habits. History of falls. Memory and ability to understand (cognition). Work and work Statistician. Reproductive health. Screening  You may have the following tests or measurements: Height, weight, and BMI. Blood pressure. Lipid and cholesterol levels. These may be checked every 5 years, or more frequently if you are over 58 years old. Skin check. Lung cancer screening. You may have this screening every year starting at age 30 if you have a 30-pack-year history of smoking and currently smoke or have quit within the past 15 years. Fecal occult blood test (FOBT) of the stool. You may have this test every year starting at age 69. Flexible sigmoidoscopy or colonoscopy. You may have a sigmoidoscopy every 5 years or a colonoscopy every 10 years starting at age 82. Hepatitis C blood test. Hepatitis B blood test. Sexually transmitted disease (STD) testing. Diabetes screening. This is done by checking your blood sugar (glucose) after you have not eaten for a while (fasting). You may have this done every 1-3 years. Bone density scan. This is done to screen for osteoporosis. You may have this done starting at age 85. Mammogram. This may be done every 1-2 years. Talk to your health care provider about how often you should have regular mammograms. Talk with your health care provider about your test results, treatment options, and if necessary, the need for more tests. Vaccines  Your health care provider may recommend certain vaccines, such as: Influenza vaccine. This is recommended every year. Tetanus, diphtheria, and acellular pertussis (Tdap, Td) vaccine. You may need a Td booster every 10 years. Zoster vaccine. You may need this after age 68. Pneumococcal 13-valent conjugate (PCV13) vaccine. One dose is recommended after age 63. Pneumococcal polysaccharide (PPSV23) vaccine. One dose is recommended after age 80. Talk  to your health care provider about which  screenings and vaccines you need and how often you need them. This information is not intended to replace advice given to you by your health care provider. Make sure you discuss any questions you have with your health care provider. Document Released: 12/19/2015 Document Revised: 08/11/2016 Document Reviewed: 09/23/2015 Elsevier Interactive Patient Education  2017 [place] Prevention in the Home Falls can cause injuries. They can happen to people of all ages. There are many things you can do to make your home safe and to help prevent falls. What can I do on the outside of my home? Regularly fix the edges of walkways and driveways and fix any cracks. Remove anything that might make you trip as you walk through a door, such as a raised step or threshold. Trim any bushes or trees on the path to your home. Use bright outdoor lighting. Clear any walking paths of anything that might make someone trip, such as rocks or tools. Regularly check to see if handrails are loose or broken. Make sure that both sides of any steps have handrails. Any raised decks and porches should have guardrails on the edges. Have any leaves, snow, or ice cleared regularly. Use sand or salt on walking paths during winter. Clean up any spills in your garage right away. This includes oil or grease spills. What can I do in the bathroom? Use night lights. Install grab bars by the toilet and in the tub and shower. Do not use towel bars as grab bars. Use non-skid mats or decals in the tub or shower. If you need to sit down in the shower, use a plastic, non-slip stool. Keep the floor dry. Clean up any water that spills on the floor as soon as it happens. Remove soap buildup in the tub or shower regularly. Attach bath mats securely with double-sided non-slip rug tape. Do not have throw rugs and other things on the floor that can make you trip. What can I do in the bedroom? Use night lights. Make sure that you have a  light by your bed that is easy to reach. Do not use any sheets or blankets that are too big for your bed. They should not hang down onto the floor. Have a firm chair that has side arms. You can use this for support while you get dressed. Do not have throw rugs and other things on the floor that can make you trip. What can I do in the kitchen? Clean up any spills right away. Avoid walking on wet floors. Keep items that you use a lot in easy-to-reach places. If you need to reach something above you, use a strong step stool that has a grab bar. Keep electrical cords out of the way. Do not use floor polish or wax that makes floors slippery. If you must use wax, use non-skid floor wax. Do not have throw rugs and other things on the floor that can make you trip. What can I do with my stairs? Do not leave any items on the stairs. Make sure that there are handrails on both sides of the stairs and use them. Fix handrails that are broken or loose. Make sure that handrails are as long as the stairways. Check any carpeting to make sure that it is firmly attached to the stairs. Fix any carpet that is loose or worn. Avoid having throw rugs at the top or bottom of the stairs. If you do have throw rugs,  attach them to the floor with carpet tape. Make sure that you have a light switch at the top of the stairs and the bottom of the stairs. If you do not have them, ask someone to add them for you. What else can I do to help prevent falls? Wear shoes that: Do not have high heels. Have rubber bottoms. Are comfortable and fit you well. Are closed at the toe. Do not wear sandals. If you use a stepladder: Make sure that it is fully opened. Do not climb a closed stepladder. Make sure that both sides of the stepladder are locked into place. Ask someone to hold it for you, if possible. Clearly mark and make sure that you can see: Any grab bars or handrails. First and last steps. Where the edge of each step  is. Use tools that help you move around (mobility aids) if they are needed. These include: Canes. Walkers. Scooters. Crutches. Turn on the lights when you go into a dark area. Replace any light bulbs as soon as they burn out. Set up your furniture so you have a clear path. Avoid moving your furniture around. If any of your floors are uneven, fix them. If there are any pets around you, be aware of where they are. Review your medicines with your doctor. Some medicines can make you feel dizzy. This can increase your chance of falling. Ask your doctor what other things that you can do to help prevent falls. This information is not intended to replace advice given to you by your health care provider. Make sure you discuss any questions you have with your health care provider. Document Released: 09/18/2009 Document Revised: 04/29/2016 Document Reviewed: 12/27/2014 Elsevier Interactive Patient Education  2017 [employer].

## 2022-10-05 ENCOUNTER — Emergency Department: Payer: Medicare Other

## 2022-10-05 ENCOUNTER — Telehealth: Payer: Self-pay | Admitting: Cardiovascular Disease

## 2022-10-05 ENCOUNTER — Other Ambulatory Visit: Payer: Self-pay

## 2022-10-05 ENCOUNTER — Emergency Department
Admission: EM | Admit: 2022-10-05 | Discharge: 2022-10-05 | Disposition: A | Discharge status: Home / Self Care | Payer: Medicare Other | Attending: Emergency Medicine | Admitting: Emergency Medicine

## 2022-10-05 ENCOUNTER — Ambulatory Visit: Payer: Self-pay | Admitting: *Deleted

## 2022-10-05 DIAGNOSIS — R079 Chest pain, unspecified: Secondary | ICD-10-CM | POA: Diagnosis not present

## 2022-10-05 DIAGNOSIS — R1013 Epigastric pain: Secondary | ICD-10-CM | POA: Diagnosis not present

## 2022-10-05 DIAGNOSIS — R002 Palpitations: Secondary | ICD-10-CM | POA: Diagnosis not present

## 2022-10-05 LAB — COMPREHENSIVE METABOLIC PANEL
ALT: 17 U/L (ref 0–44)
AST: 21 U/L (ref 15–41)
Albumin: 3.8 g/dL (ref 3.5–5.0)
Alkaline Phosphatase: 98 U/L (ref 38–126)
Anion gap: 9 (ref 5–15)
BUN: 13 mg/dL (ref 8–23)
CO2: 21 mmol/L — ABNORMAL LOW (ref 22–32)
Calcium: 8.9 mg/dL (ref 8.9–10.3)
Chloride: 107 mmol/L (ref 98–111)
Creatinine, Ser: 0.86 mg/dL (ref 0.44–1.00)
GFR, Estimated: >60 mL/min (ref >60)
Glucose, Bld: 119 mg/dL — ABNORMAL HIGH (ref 70–99)
Potassium: 3.7 mmol/L (ref 3.5–5.1)
Sodium: 137 mmol/L (ref 135–145)
Total Bilirubin: 0.6 mg/dL (ref 0.3–1.2)
Total Protein: 7.6 g/dL (ref 6.5–8.1)

## 2022-10-05 LAB — CBC WITH DIFFERENTIAL/PLATELET
Abs Immature Granulocytes: 0.02 10*3/uL (ref 0.00–0.07)
Basophils Absolute: 0.1 10*3/uL (ref 0.0–0.1)
Basophils Relative: 1 %
Eosinophils Absolute: 0.2 10*3/uL (ref 0.0–0.5)
Eosinophils Relative: 3 %
HCT: 36.2 % (ref 36.0–46.0)
Hemoglobin: 11.4 g/dL — ABNORMAL LOW (ref 12.0–15.0)
Immature Granulocytes: 0 %
Lymphocytes Relative: 21 %
Lymphs Abs: 1.5 10*3/uL (ref 0.7–4.0)
MCH: 27.1 pg (ref 26.0–34.0)
MCHC: 31.5 g/dL (ref 30.0–36.0)
MCV: 86.2 fL (ref 80.0–100.0)
Monocytes Absolute: 0.3 10*3/uL (ref 0.1–1.0)
Monocytes Relative: 4 %
Neutro Abs: 5.2 10*3/uL (ref 1.7–7.7)
Neutrophils Relative %: 71 %
Platelets: 195 10*3/uL (ref 150–400)
RBC: 4.2 MIL/uL (ref 3.87–5.11)
RDW: 13.9 % (ref 11.5–15.5)
WBC: 7.2 10*3/uL (ref 4.0–10.5)
nRBC: 0 % (ref 0.0–0.2)

## 2022-10-05 LAB — TSH: TSH: 2.113 u[IU]/mL (ref 0.350–4.500)

## 2022-10-05 LAB — TROPONIN I (HIGH SENSITIVITY)
Troponin I (High Sensitivity): 4 ng/L (ref <18)
Troponin I (High Sensitivity): 5 ng/L (ref <18)

## 2022-10-05 NOTE — ED Provider Triage Note (Signed)
  Emergency Medicine Provider Triage Evaluation Note  Kaylee Reyes , a 71 y.o.female,  was evaluated in triage.  Pt complains of palpitations since Friday.  She states that this happened before, however has never had explanation for it.  She states that the palpitations these past few days have been particularly worse than usual.  Denies any recent injuries or illnesses.  She is asymptomatic at this time.   Review of Systems  Positive: Palpitations. Negative: Denies fever, chest pain, vomiting  Physical Exam   Vitals:   10/05/22 1545  BP: (!) 181/93  Pulse: 72  Resp: 18  Temp: 98.1 F (36.7 C)  SpO2: 98%   Gen:   Awake, no distress   Resp:  Normal effort  MSK:   Moves extremities without difficulty  Other:    Medical Decision Making  Given the patient's initial medical screening exam, the following diagnostic evaluation has been ordered. The patient will be placed in the appropriate treatment space, once one is available, to complete the evaluation and treatment. I have discussed the plan of care with the patient and I have advised the patient that an ED physician or mid-level practitioner will reevaluate their condition after the test results have been received, as the results may give them additional insight into the type of treatment they may need.    Diagnostics: Labs, EKG, CXR  Treatments: none immediately   Teodoro Spray, [place] 10/05/22 1549

## 2022-10-05 NOTE — Telephone Encounter (Signed)
Spoke with Pt who states that she reached out to her PCP who told her to go to the ER for Eval.

## 2022-10-05 NOTE — Discharge Instructions (Addendum)
All your lab work including your heart blood work and your EKG and x-ray look good.  I will ask you to follow-up with your regular doctor and/or your cardiologist and see if they want to change your blood pressure medications slightly.  Your pressure now is 124/62 and your heart rate is in the high 50s 56-59.  I think you are safe to go home at this point but I do want you to follow-up with your doctor.(Blood pressure has been mostly 150s to 160s over 70s)

## 2022-10-05 NOTE — Telephone Encounter (Signed)
  Pt said, she's been having Irregular HR and she wants to know what she can do, she said her medication has changed and would like to discuss it with Dr. Elwyn Reach nurse

## 2022-10-05 NOTE — Telephone Encounter (Signed)
  Chief Complaint: Heart Palpitations Symptoms: Heart palpitations, "Hard" bounding since Friday. Sunday did not occur, back Monday. States "Fluttering" lasts 1-2 hours, goes away few minutes, back again."Pretty much constant." Frequency: Friday HS Pertinent Negatives: Patient denies new med, dizziness, chest pain. Disposition: '[x]'$ ED /'[]'$ Urgent Care (no appt availability in office) / '[]'$ Appointment(In office/virtual)/ '[]'$  [facility]/ '[]'$ Home Care/ '[]'$ Refused Recommended Disposition /'[]'$ [facility]/ '[]'$  Follow-up with PCP Additional Notes: Advised ED. States will follow disposition. Care advise provided, verbalizes understanding.  Reason for Disposition  [1] Heart beating very rapidly (e.g., > 140 / minute) AND [2] present now  (Exception: During exercise.)    Extended palpitations.  Answer Assessment - Initial Assessment Questions 1. DESCRIPTION: "Please describe your heart rate or heartbeat that you are having" (e.g., fast/slow, regular/irregular, skipped or extra beats, "palpitations")     *No Answer* 2. ONSET: "When did it start?" (Minutes, hours or days)      Friday night through Sat. Sunday ok. Monday again 3. DURATION: "How long does it last" (e.g., seconds, minutes, hours)     Varies, lasts an hour or so. 4. PATTERN "Does it come and go, or has it been constant since it started?"  "Does it get worse with exertion?"   "Are you feeling it now?"     Come and go 5. TAP: "Using your hand, can you tap out what you are feeling on a chair or table in front of you, so that I can hear?" (Note: not all patients can do this)       O2 97  HR 68 6. HEART RATE: "Can you tell me your heart rate?" "How many beats in 15 seconds?"  (Note: not all patients can do this)       No 7. RECURRENT SYMPTOM: "Have you ever had this before?" If Yes, ask: "When was the last time?" and "What happened that time?"      Yes. Lasting longer 8. CAUSE: "What do you think is causing the palpitations?"      Unsure 9. CARDIAC HISTORY: "Do you have any history of heart disease?" (e.g., heart attack, angina, bypass surgery, angioplasty, arrhythmia)      NO. Saw Cardiologist for BP after hospitalization 10. OTHER SYMPTOMS: "Do you have any other symptoms?" (e.g., dizziness, chest pain, sweating, difficulty breathing)       None  Protocols used: Heart Rate and Heartbeat Questions-A-AH

## 2022-10-05 NOTE — Telephone Encounter (Signed)
Returned call to pt. No answer. Left msg to call back.  

## 2022-10-05 NOTE — ED Triage Notes (Signed)
Pt to ED via POV from home Pt reports intermittent palpitations since Friday. Pt denies hx afib, MI or other cardiac hx. Pt reports pulse ox at home reading normal at 72

## 2022-10-05 NOTE — ED Provider Notes (Signed)
[facility] Provider Note    Event Date/Time   First MD Initiated Contact with Patient 10/05/22 1719     (approximate)   History   Palpitations   HPI  Kaylee Reyes is a 71 y.o. female who complains of forceful heartbeats that been going on since possibly Friday at least 3 to 4 days.  They are not rapid or fast she has not had any chest pain but heart rates are very noticeable.  It actually stopped when she came to the emergency department.  She had had some epigastric pain for couple days before that but that stopped a couple days ago.  She was not sure if it was a pancreas problem or her stomach.      Physical Exam   Triage Vital Signs: ED Triage Vitals [10/05/22 1545]  Enc Vitals Group     BP (!) 181/93     Pulse Rate 72     Resp 18     Temp 98.1 F (36.7 C)     Temp Source Oral     SpO2 98 %     Weight      Height      Head Circumference      Peak Flow      Pain Score 0     Pain Loc      Pain Edu?      Excl. in [place]?     Most recent vital signs: Vitals:   10/05/22 1545 10/05/22 1730  BP: (!) 181/93 (!) 152/73  Pulse: 72   Resp: 18 14  Temp: 98.1 F (36.7 C)   SpO2: 98%      General: Awake, no distress.  CV:  Good peripheral perfusion.  Heart regular rate and rhythm no audible murmurs Resp:  Normal effort.  Lungs are clear Abd:  No distention.  Soft and nontender Remedies no edema   ED Results / Procedures / Treatments   Labs (all labs ordered are listed, but only abnormal results are displayed) Labs Reviewed  COMPREHENSIVE METABOLIC PANEL - Abnormal; Notable for the following components:      Result Value   CO2 21 (*)    Glucose, Bld 119 (*)    All other components within normal limits  CBC WITH DIFFERENTIAL/PLATELET - Abnormal; Notable for the following components:   Hemoglobin 11.4 (*)    All other components within normal limits  TSH  TROPONIN I (HIGH SENSITIVITY)  TROPONIN I (HIGH SENSITIVITY)      EKG  EKG reviewed and interpreted by me shows normal sinus rhythm rate of 65 normal axis no acute ST-T changes   RADIOLOGY  Chest x-ray read by radiology reviewed and interpreted by me as negative PROCEDURES:  Critical Care performed:   Procedures   MEDICATIONS ORDERED IN ED: Medications - No data to display   IMPRESSION / MDM / [place] / ED COURSE  I reviewed the triage vital signs and the nursing notes. ----------------------------------------- 6:39 PM on 10/05/2022 ----------------------------------------- Patient's blood pressures come down gradually to 124/76.  She has not had any irregular except for 1 or 2 premature atrial contractions.  Her labs including CBC Mitzie and troponin are negative.  Chest x-ray is negative.  Not sure what was happening previously but I am going to let her go home.  We will have her follow-up with her regular doctor. Differential diagnosis includes, but is not limited to, PVCs or PACs.  Some other arrhythmia.  MI does  not seem to have happened.  Patient has no chest pain consistent with NSTEMI and no EKG changes that are worrisome.  Her blood pressures come down nicely just resting.  I will have her follow-up with her doctor to have them keep an eye on the blood pressure.  Her heart rate is in the high 50s.  Perhaps she can have her blood pressure medications changed somewhat.  Patient's presentation is most consistent with acute complicated illness / injury requiring diagnostic workup.  The patient is on the cardiac monitor to evaluate for evidence of arrhythmia and/or significant heart rate changes.  None have been seen  FINAL CLINICAL IMPRESSION(S) / ED DIAGNOSES   Final diagnoses:  Palpitations     Rx / DC Orders   ED Discharge Orders     None        Note:  This document was prepared using Dragon voice recognition software and may include unintentional dictation errors.   Nena Polio, MD 10/05/22  [phone]

## 2022-10-05 NOTE — ED Notes (Signed)
MD at bedside. 

## 2022-10-05 NOTE — Telephone Encounter (Signed)
Patient returning call.

## 2022-10-06 ENCOUNTER — Ambulatory Visit: Payer: Medicare Other | Admitting: Family Medicine

## 2022-10-14 ENCOUNTER — Other Ambulatory Visit: Payer: Self-pay | Admitting: Oncology

## 2022-10-17 NOTE — Progress Notes (Deleted)
Cardiology Office Note  Date:  10/17/2022   ID:  Kaylee Reyes, DOB 05/08/1951, MRN [id number]  PCP:  Birdie Sons, MD   No chief complaint on file.   HPI:  Ms. Kaylee Reyes is a 71 year old woman with past medical history of Hypertension Breast cancer Obesity Covid feb 2022 Who presents for follow-up of her hypertension, leg swelling  Last seen in clinic by myself August 2022 Recently evaluated in the emergency room October 05, 2022 for palpitations Blood pressure elevated in the ER 181/93    Reports have chronically low diastolic pressures, Systolic pressures periodically elevated Recent weight loss, down from 300 pounds since the beginning of the year  Taking HCTZ in the morning, atenolol AM Low diastolic pressure, sometimes down to 50s at home On potassium, OTC  Recently tried on Triamterene, did not seem to work very well  Elevated LFTs 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 She has been losing weight  Prior hx of chemo, Chronic leg swelling Neuropathy , sleep issues Prior prescription of Neurontin, did not try it but is interested again Finishing "caner pill"/letrozole, has completed 5 years  Ct chest 01/2021 Minimal aortic athero  Lab work reviewed A1C 5.2 Total chol 240, off statin/myalgias  Echo reviewed NORMAL LEFT VENTRICULAR SYSTOLIC FUNCTION  NORMAL RIGHT VENTRICULAR SYSTOLIC FUNCTION  TRIVIAL REGURGITATION NOTED (See above)  NO VALVULAR STENOSIS    EKG personally reviewed by myself on todays visit NSR  rate 66 no St or T wave changes   PMH:   has a past medical history of Allergy, Anemia, Arthritis (2016), Body tinea (10/01/2015), Cancer ([place]) (09/2015), COVID-19 (02/23/2021), GERD (gastroesophageal reflux disease), History of breast cancer (09/2015), History of chicken pox, History of peptic ulcer disease (09/25/2015), Hyperlipidemia, Hypertension, Hypothyroidism, Neuropathy of foot, Pinched nerve, PONV (postoperative nausea and vomiting), Thyroid  disease, Tuberculosis exposure, Vertigo, and Wears dentures.  PSH:    Past Surgical History:  Procedure Laterality Date   ABDOMINAL HYSTERECTOMY  07/13/2013   Total. with BSO for postmenopausal bleeding and fibroids with large cervical polyp. Dr. Levy Sjogren and Dr. Glennon Mac at [facility]  04/17/2011   Hepatomegaly with borderline slenomegaly. Suggestive of fatty liver. s/p cholecystectomy. Portions of aorta obscured   BREAST SURGERY Right 2011   BREAST BIOPSY   CESAREAN SECTION  1988   CHOLECYSTECTOMY  1990   COLONOSCOPY WITH PROPOFOL N/A 09/01/2017   Procedure: COLONOSCOPY WITH PROPOFOL;  Surgeon: [name], MD;  Location: [facility];  Service: Gastroenterology;  Laterality: N/A;   DIAGNOSTIC LAPAROSCOPY     ESOPHAGOGASTRODUODENOSCOPY (EGD) WITH PROPOFOL N/A 01/17/2020   Procedure: ESOPHAGOGASTRODUODENOSCOPY (EGD) WITH PROPOFOL;  Surgeon: [name], MD;  Location: [facility];  Service: Endoscopy;  Laterality: N/A;   EXPLORATORY LAPAROTOMY     Left breast   MASTECTOMY     MASTECTOMY W/ SENTINEL NODE BIOPSY Bilateral 10/28/2015   Procedure: MASTECTOMY WITH SENTINEL LYMPH NODE BIOPSY;  Surgeon: Hubbard Robinson, MD;  Location: [facility];  Service: General;  Laterality: Bilateral;   PORT-A-CATH REMOVAL Left 10/11/2016   Procedure: REMOVAL PORT-A-CATH;  Surgeon: Hubbard Robinson, MD;  Location: [facility];  Service: General;  Laterality: Left;   PORTACATH PLACEMENT Left 12/23/2015   Procedure: INSERTION PORT-A-CATH;  Surgeon: Hubbard Robinson, MD;  Location: [facility];  Service: General;  Laterality: Left;   WRIST SURGERY      Current Outpatient Medications  Medication Sig Dispense Refill   atenolol (TENORMIN) 25 MG tablet TAKE 1 TABLET IN THE MORNING. TAKE  IN ADDITION TO 50 MG IN THE MORNING 90 tablet 4   atenolol (TENORMIN) 50 MG tablet Take by mouth. (Patient not taking: Reported on 09/16/2022)     azithromycin (ZITHROMAX) 250 MG tablet Take 1 tablet (250  mg total) by mouth daily. Take first 2 tablets together, then 1 every day until finished. (Patient not taking: Reported on 09/16/2022) 6 tablet 0   Carboxymeth-Glycerin-Polysorb (REFRESH OPTIVE MEGA-3 OP) Place 1 drop into both eyes daily. (Patient not taking: Reported on 09/16/2022)     docusate sodium (COLACE) 100 MG capsule Take 200 mg by mouth daily. 2 in am, 1 at night     fexofenadine (ALLEGRA) 60 MG tablet Take 60 mg by mouth daily.     fluticasone (FLONASE) 50 MCG/ACT nasal spray Place 1 spray into both nostrils daily. 11.1 mL 0   hydrochlorothiazide (HYDRODIURIL) 25 MG tablet Take 25 mg by mouth daily. (Patient not taking: Reported on 09/16/2022)     lansoprazole (PREVACID) 30 MG capsule TAKE 1 CAPSULE DAILY AT 12 NOON (NEED OFFICE VISIT PRIOR TO FURTHER REFILLS) 90 capsule 3   levothyroxine (SYNTHROID) 75 MCG tablet TAKE 1 TABLET DAILY 90 tablet 4   meclizine (ANTIVERT) 12.5 MG tablet TAKE 1 TABLET(12.5 MG) BY MOUTH THREE TIMES DAILY AS NEEDED FOR DIZZINESS 90 tablet 3   ondansetron (ZOFRAN) 4 MG tablet Take 1 tablet (4 mg total) by mouth every 8 (eight) hours as needed for nausea or vomiting. (Patient not taking: Reported on 09/16/2022) 30 tablet 0   triamcinolone (KENALOG) 0.025 % cream Apply 1 Application topically 2 (two) times daily.     Vitamin D, Ergocalciferol, (DRISDOL) 1.25 MG (50000 UNIT) CAPS capsule TAKE 1 CAPSULE BY MOUTH 1 TIME A WEEK 12 capsule 1   No current facility-administered medications for this visit.     Allergies:   Penicillins, Nickel, Keflex [cephalexin], and Pravastatin sodium   Social History:  The patient  reports that she has never smoked. She has never used smokeless tobacco. She reports that she does not drink alcohol and does not use drugs.   Family History:   family history includes Diabetes in her brother; Hypertension in her brother and mother; Thyroid disease in her mother.    Review of Systems: Review of Systems  Constitutional: Negative.    HENT: Negative.    Respiratory: Negative.    Cardiovascular: Negative.   Gastrointestinal: Negative.   Musculoskeletal: Negative.   Neurological: Negative.   Psychiatric/Behavioral: Negative.    All other systems reviewed and are negative.    PHYSICAL EXAM: VS:  There were no vitals taken for this visit. , BMI There is no height or weight on file to calculate BMI. GEN: Well nourished, well developed, in no acute distress HEENT: normal Neck: no JVD, carotid bruits, or masses Cardiac: RRR; no murmurs, rubs, or gallops,no edema  Respiratory:  clear to auscultation bilaterally, normal work of breathing GI: soft, nontender, nondistended, + BS MS: no deformity or atrophy Skin: warm and dry, no rash Neuro:  Strength and sensation are intact Psych: euthymic mood, full affect    Recent Labs: 06/26/2022: Magnesium 2.2 10/05/2022: ALT 17; BUN 13; Creatinine, Ser 0.86; Hemoglobin 11.4; Platelets 195; Potassium 3.7; Sodium 137; TSH 2.113    Lipid Panel Lab Results  Component Value Date   CHOL 205 (H) 06/26/2022   HDL 26 (L) 06/26/2022   LDLCALC 154 (H) 06/26/2022   TRIG 124 06/26/2022      Wt Readings from Last 3 Encounters:  09/16/22  268 lb (121.6 kg)  07/10/22 268 lb 1.3 oz (121.6 kg)  06/25/22 268 lb (121.6 kg)       ASSESSMENT AND PLAN:  Problem List Items Addressed This Visit       Cardiology Problems   Pure hypercholesterolemia - Primary   Essential hypertension     Other   Pedal edema   SOB (shortness of breath)   Other Visit Diagnoses     Adult BMI 35.0-35.9 kg/sq m          Essential hypertension Typically with a low diastolic pressures at home, elevated systolic Blood pressure numbers reviewed, Recommend HCTZ as she is taking in the morning, move the atenolol to the evening For systolic pressures over 480 could take extra dose of HCTZ or extra half dose of atenolol Will try to avoid adding new medications at this time at her  request  Hyperlipidemia Dramatic weight loss over the past year, recommended medical management for now with weight loss, walking program as tolerated Minimal aortic atherosclerosis on CT scan February 2022  Morbid obesity We have encouraged continued exercise, careful diet management in an effort to lose weight.  Leg swelling Likely exacerbated by weight, venous insufficiency Symptoms are mild, may improve with weight loss Consider leg elevation, compression hose    Total encounter time more than 60 minutes  Greater than 50% was spent in counseling and coordination of care with the patient    Signed, Esmond Plants, M.D., Ph.D. [place], [place]

## 2022-10-18 ENCOUNTER — Encounter: Payer: Self-pay | Admitting: Cardiovascular Disease

## 2022-10-18 ENCOUNTER — Ambulatory Visit: Payer: Medicare Other | Attending: Cardiovascular Disease | Admitting: Cardiovascular Disease

## 2022-10-18 ENCOUNTER — Ambulatory Visit: Payer: Medicare Other | Admitting: Family Medicine

## 2022-10-18 DIAGNOSIS — R0602 Shortness of breath: Secondary | ICD-10-CM

## 2022-10-18 DIAGNOSIS — E7800 Pure hypercholesterolemia, unspecified: Secondary | ICD-10-CM

## 2022-10-18 DIAGNOSIS — R600 Localized edema: Secondary | ICD-10-CM

## 2022-10-18 DIAGNOSIS — I10 Essential (primary) hypertension: Secondary | ICD-10-CM

## 2022-10-18 DIAGNOSIS — Z6835 Body mass index (BMI) 35.0-35.9, adult: Secondary | ICD-10-CM

## 2022-10-21 ENCOUNTER — Telehealth: Payer: Self-pay

## 2022-10-21 NOTE — Telephone Encounter (Signed)
        Patient  visited [facility] on 10/05/2022  for palpitations.   Telephone encounter attempt :  1st  A HIPAA compliant voice message was left requesting a return call.  Instructed patient to call back at [phone].   [place] Resource Care Guide   [email or web address]  ?? [phone]   Website: [email or web address]  [email or web address]

## 2022-10-22 ENCOUNTER — Telehealth: Payer: Self-pay

## 2022-10-22 ENCOUNTER — Ambulatory Visit: Payer: Self-pay | Admitting: *Deleted

## 2022-10-22 ENCOUNTER — Other Ambulatory Visit: Payer: Self-pay

## 2022-10-22 MED ORDER — FLUTICASONE PROPIONATE 50 MCG/ACT NA SUSP: 1.0000 | Freq: Every day | NASAL | 3 refills | Status: DC

## 2022-10-22 NOTE — Telephone Encounter (Signed)
     Patient  visit on 10/05/2022  at [facility] was for Palpitations.  Have you been able to follow up with your primary care physician? Patient plans to follow up with her PCP.  The patient was or was not able to obtain any needed medicine or equipment. No medications prescribed.  Are there diet recommendations that you are having difficulty following?No  Patient expresses understanding of discharge instructions and education provided has no other needs at this time.    [place] Resource Care Guide   ??[email or web address]  ?? [phone]   Website: [email or web address]

## 2022-10-22 NOTE — Telephone Encounter (Signed)
Summary: sinus drainage and medication   In aug or sept pt went to urgent care for a sinus infection / fluticasone propionate 27mg was prescribed and pt is asking if the office would send her an Rx for this for her sinuses and allergies / pt has drainage / please advise      Called patient to review sinus sx and medication request. No answer. LVMTCB. ##[phone]

## 2022-10-22 NOTE — Telephone Encounter (Signed)
I have already spoke with pt regarding this medication this morning and sent refill request to provider. Pt didn't need further assistance.   Reason for Disposition  [1] Caller has NON-URGENT medicine question about med that PCP prescribed AND [2] triager unable to answer question  Answer Assessment - Initial Assessment Questions 1. DRUG NAME: "What medicine do you need to have refilled?"     Flonase nasal spray 2. REFILLS REMAINING: "How many refills are remaining?" (Note: The label on the medicine or pill bottle will show how many refills are remaining. If there are no refills remaining, then a renewal may be needed.)     0 4. PRESCRIBING HCP: "Who prescribed it?" Reason: If prescribed by specialist, call should be referred to that group.     UC 5. SYMPTOMS: "Do you have any symptoms?"     Nasal allergies  Protocols used: Medication Refill and Renewal Call-A-AH

## 2022-11-13 ENCOUNTER — Ambulatory Visit
Admission: EM | Admit: 2022-11-13 | Discharge: 2022-11-13 | Disposition: A | Discharge status: Home / Self Care | Payer: Medicare Other | Attending: Physician Assistant | Admitting: Physician Assistant

## 2022-11-13 ENCOUNTER — Encounter: Payer: Self-pay | Admitting: Emergency Medicine

## 2022-11-13 DIAGNOSIS — J309 Allergic rhinitis, unspecified: Secondary | ICD-10-CM | POA: Diagnosis not present

## 2022-11-13 DIAGNOSIS — H60392 Other infective otitis externa, left ear: Secondary | ICD-10-CM | POA: Diagnosis not present

## 2022-11-13 MED ORDER — CIPROFLOXACIN-DEXAMETHASONE 0.3-0.1 % OT SUSP: 4.0000 [drp] | Freq: Two times a day (BID) | OTIC | 0 refills | Status: AC

## 2022-11-13 NOTE — Discharge Instructions (Addendum)
-  Antibiotic/steroid drops to pharmacy.  Continue with your antihistamine and nasal spray.  Follow-up as needed especially if not improving over the next week or symptoms worsen.

## 2022-11-13 NOTE — ED Provider Notes (Signed)
[facility]    CSN: [id number] Arrival date & time: 11/13/22  1046      History   Chief Complaint Chief Complaint  Patient presents with   Otalgia    HPI Kaylee Reyes is a 71 y.o. female with a history of allergic rhinitis.  She says that she takes antihistamines and Flonase every day.  Says over the past week she has noticed pain that is sharp that comes and goes in her left ear and some crusting.  It is tender to touch and pain radiates to her jaw.  Increased pain whenever she tries to chew.  Denies any fevers.  No cough or breathing trouble.  No significant drainage from the ear but she did have crusting.  No hearing problems.  She has applied hydroperoxide to a Q-tip to try to help symptoms but it did not help.  No other complaints.  HPI  Past Medical History:  Diagnosis Date   Allergy    Notes in my chart   Anemia    Arthritis 2016   Noted from Mri and xrays   Body tinea 10/01/2015   Previously treated by Dr. Koleen Nimrod    Cancer [facility]) 09/2015   Had full mascetomy   COVID-19 02/23/2021   MAB infusion 01/23/2021   GERD (gastroesophageal reflux disease)    History of breast cancer 09/2015   breast cancer   History of chicken pox    History of peptic ulcer disease 09/25/2015   Hyperlipidemia    Hypertension    Hypothyroidism    Neuropathy of foot    bilateral   Pinched nerve    PONV (postoperative nausea and vomiting)    exploratory surgery at [facility]   Thyroid disease    Tuberculosis exposure    Mother had TB.  Pt shows (+) on tests, but has never had.   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9) 09/25/2015   Hypercholesterolemia 09/25/2015   Essential hypertension 09/25/2015   Allergic rhinitis 09/25/2015   GERD (gastroesophageal reflux disease) 09/25/2015   Eczema 09/25/2015    Past Surgical History:  Procedure Laterality Date   ABDOMINAL HYSTERECTOMY  07/13/2013   Total. with BSO for postmenopausal bleeding and fibroids with large cervical polyp. Dr. Levy Sjogren and Dr. Glennon Mac at [place]  04/17/2011   Hepatomegaly with borderline slenomegaly. Suggestive of fatty liver. s/p cholecystectomy. Portions of aorta obscured   BREAST SURGERY Right 2011   BREAST BIOPSY  [place]   COLONOSCOPY WITH PROPOFOL N/A 09/01/2017   Procedure: COLONOSCOPY WITH PROPOFOL;  Surgeon: [name], MD;  Location: [facility];  Service: Gastroenterology;  Laterality: N/A;   DIAGNOSTIC LAPAROSCOPY     ESOPHAGOGASTRODUODENOSCOPY (EGD) WITH PROPOFOL N/A 01/17/2020   Procedure: ESOPHAGOGASTRODUODENOSCOPY (EGD) WITH PROPOFOL;  Surgeon: [name], MD;  Location: [facility];  Service: Endoscopy;  Laterality: N/A;   EXPLORATORY LAPAROTOMY     Left breast   MASTECTOMY     MASTECTOMY W/ SENTINEL NODE BIOPSY Bilateral 10/28/2015   Procedure: MASTECTOMY WITH SENTINEL LYMPH NODE BIOPSY;  Surgeon: Hubbard Robinson, MD;  Location: [facility];  Service: General;  Laterality: Bilateral;   PORT-A-CATH REMOVAL Left 10/11/2016   Procedure: REMOVAL PORT-A-CATH;  Surgeon: Hubbard Robinson, MD;  Location: [facility];  Service: General;  Laterality: Left;   PORTACATH PLACEMENT Left 12/23/2015   Procedure: INSERTION PORT-A-CATH;  Surgeon: Hubbard Robinson, MD;  Location: [facility];  Service: General;  Laterality: Left;   WRIST SURGERY      OB History     Gravida  3   Para  2   Term  2   Preterm      AB  1   Living  2      SAB  1   IAB      Ectopic      Multiple      Live Births  2            Home Medications    Prior to Admission medications   Medication Sig Start Date End Date Taking? Authorizing Provider  atenolol (TENORMIN) 25 MG tablet TAKE 1 TABLET IN THE MORNING. TAKE IN ADDITION TO 50 MG IN THE MORNING 08/06/22  Yes Birdie Sons, MD  ciprofloxacin-dexamethasone (CIPRODEX) OTIC suspension Place 4 drops into the left ear 2 (two) times daily for 7 days. 11/13/22 11/20/22 Yes Laurene Footman B, PA-C  fluticasone (FLONASE) 50 MCG/ACT nasal spray Place 1 spray into both nostrils daily. 10/22/22  Yes Birdie Sons, MD  lansoprazole (PREVACID) 30 MG capsule TAKE 1 CAPSULE DAILY AT 12 NOON (NEED OFFICE VISIT PRIOR TO FURTHER REFILLS) 09/08/22  Yes Birdie Sons, MD  levothyroxine (SYNTHROID) 75 MCG tablet TAKE 1 TABLET DAILY 02/03/22  Yes Birdie Sons, MD  Vitamin D, Ergocalciferol, (DRISDOL) 1.25 MG (50000 UNIT) CAPS capsule TAKE 1 CAPSULE BY MOUTH 1 TIME A WEEK 10/14/22  Yes Borders, Kirt Boys, NP  atenolol (TENORMIN) 50 MG tablet Take by mouth. Patient not taking: Reported on 09/16/2022 06/09/18   [provider]  azithromycin (ZITHROMAX) 250 MG tablet Take 1 tablet (250 mg total) by mouth daily. Take first 2 tablets together, then 1 every day until finished. Patient not taking: Reported on 09/16/2022 07/10/22   Hans Eden, NP  Carboxymeth-Glycerin-Polysorb  (REFRESH OPTIVE MEGA-3 OP) Place 1 drop into both eyes daily. Patient not taking: Reported on 09/16/2022    [provider]  docusate sodium (COLACE) 100 MG capsule Take 200 mg by mouth daily. 2 in am, 1 at night    [provider]  fexofenadine (ALLEGRA) 60 MG tablet Take 60 mg by mouth daily.    [provider]  hydrochlorothiazide (HYDRODIURIL) 25 MG tablet Take 25 mg by mouth daily. Patient not taking: Reported on 09/16/2022 08/21/22   [provider]  meclizine (ANTIVERT) 12.5 MG tablet TAKE 1 TABLET(12.5 MG) BY MOUTH THREE TIMES DAILY AS  NEEDED FOR DIZZINESS 08/20/21   Birdie Sons, MD  ondansetron (ZOFRAN) 4 MG tablet Take 1 tablet (4 mg total) by mouth every 8 (eight) hours as needed for nausea or vomiting. Patient not taking: Reported on 09/16/2022 06/25/22   Mikey Kirschner, PA-C  triamcinolone (KENALOG) 0.025 % cream Apply 1 Application topically 2 (two) times daily. 05/17/22   [provider]    Family History Family History  Problem Relation Age of Onset   Hypertension Mother    Thyroid disease Mother    Hypertension Brother    Diabetes Brother    Breast cancer Neg Hx    Ovarian cancer Neg Hx    Colon cancer Neg Hx     Social History Social History   Tobacco Use   Smoking status: Never   Smokeless tobacco: Never  Vaping Use   Vaping Use: Never used  Substance Use Topics   Alcohol use: Never   Drug use: Never     Allergies   Penicillins, Nickel, Keflex [cephalexin], and Pravastatin sodium   Review of Systems Review of Systems  Constitutional:  Negative for chills, diaphoresis, fatigue and fever.  HENT:  Positive for congestion, ear pain and rhinorrhea. Negative for ear discharge, hearing loss, sinus pressure, sinus pain and sore throat.   Respiratory:  Negative for cough and shortness of breath.   Gastrointestinal:  Negative for abdominal pain, nausea and vomiting.  Musculoskeletal:  Negative for arthralgias and  myalgias.  Skin:  Negative for rash.  Neurological:  Negative for weakness and headaches.  Hematological:  Negative for adenopathy.     Physical Exam Triage Vital Signs ED Triage Vitals [11/13/22 1217]  Enc Vitals Group     BP      Pulse      Resp      Temp      Temp src      SpO2      Weight 268 lb 1.3 oz (121.6 kg)     Height '5\' 6"'$  (1.676 m)     Head Circumference      Peak Flow      Pain Score 3     Pain Loc      Pain Edu?      Excl. in [place]?    No data found.  Updated Vital Signs BP (!) 184/81 (BP Location: Left Arm)   Pulse 62   Temp 98.2 F (36.8 C) (Oral)   Resp 14   Ht '5\' 6"'$  (1.676 m)   Wt 268 lb 1.3 oz (121.6 kg)   SpO2 94%   BMI 43.27 kg/m    Physical Exam Vitals and nursing note reviewed.  Constitutional:      General: She is not in acute distress.    Appearance: Normal appearance. She is not ill-appearing or toxic-appearing.  HENT:     Head: Normocephalic and atraumatic.     Right Ear: Tympanic membrane, ear canal and external ear normal.     Left Ear: Tympanic membrane and external ear normal. Drainage, swelling and tenderness present.     Nose: Congestion present.     Mouth/Throat:     Mouth: Mucous membranes are moist.     Pharynx: Oropharynx is clear.  Eyes:     General: No scleral icterus.       Right eye: No discharge.        Left eye: No discharge.     Conjunctiva/sclera: Conjunctivae normal.  Cardiovascular:     Rate and Rhythm: Normal  rate and regular rhythm.     Heart sounds: Normal heart sounds.  Pulmonary:     Effort: Pulmonary effort is normal. No respiratory distress.     Breath sounds: Normal breath sounds.  Musculoskeletal:     Cervical back: Neck supple.  Skin:    General: Skin is dry.  Neurological:     General: No focal deficit present.     Mental Status: She is alert. Mental status is at baseline.     Motor: No weakness.     Gait: Gait normal.  Psychiatric:        Mood and Affect: Mood normal.        Behavior:  Behavior normal.        Thought Content: Thought content normal.      UC Treatments / Results  Labs (all labs ordered are listed, but only abnormal results are displayed) Labs Reviewed - No data to display  EKG   Radiology No results found.  Procedures Procedures (including critical care time)  Medications Ordered in UC Medications - No data to display  Initial Impression / Assessment and Plan / UC Course  I have reviewed the triage vital signs and the nursing notes.  Pertinent labs & imaging results that were available during my care of the patient were reviewed by me and considered in my medical decision making (see chart for details).   72 year old female with history of allergies presents with left ear pain and crusting for the past week.  No fever.  On exam she has erythema and swelling of the left EAC with light yellowish drainage adhered to the wall of the EAC.  Tragus is tender to palpation.  TM is clear.  She also has nasal congestion without drainage.  Suspect otitis externa.  Will treat at this time with Ciprodex.  Advised her to continue her antihistamines and nasal sprays for treatment of her underlying allergies.  Follow-up as needed.   Final Clinical Impressions(s) / UC Diagnoses   Final diagnoses:  Other infective acute otitis externa of left ear  Allergic rhinitis, unspecified seasonality, unspecified trigger     Discharge Instructions      -Antibiotic/steroid drops to pharmacy.  Continue with your antihistamine and nasal spray.  Follow-up as needed especially if not improving over the next week or symptoms worsen.     ED Prescriptions     Medication Sig Dispense Auth. Provider   ciprofloxacin-dexamethasone (CIPRODEX) OTIC suspension Place 4 drops into the left ear 2 (two) times daily for 7 days. 7.5 mL Danton Clap, PA-C      PDMP not reviewed this encounter.   Danton Clap, PA-C 11/13/22 1300

## 2022-11-13 NOTE — ED Triage Notes (Signed)
Patient c/o sinus drainage and left ear pain for the past week.  Patient denies fevers.

## 2022-11-25 DIAGNOSIS — H2513 Age-related nuclear cataract, bilateral: Secondary | ICD-10-CM | POA: Diagnosis not present

## 2022-11-25 DIAGNOSIS — H16223 Keratoconjunctivitis sicca, not specified as Sjogren's, bilateral: Secondary | ICD-10-CM | POA: Diagnosis not present

## 2022-11-27 NOTE — Progress Notes (Unsigned)
Cardiology Clinic Note   Patient Name: ABIGAYLE WILINSKI Date of Encounter: 11/30/2022  Primary Care Provider:  Birdie Sons, MD Primary Cardiologist:  Ida Rogue, MD  Patient Profile    KEANA DUEITT is a 71 y.o. female with a past medical history of hypertension, hyperlipidemia, palpitations, pancreatitis, breast CA s/p mastectomy and chemo, neuropathy, hypothyroidism, GERD who presents to the clinic today to discuss blood pressure medication.   Past Medical History    Past Medical History:  Diagnosis Date   Allergy    Notes in my chart   Anemia    Arthritis 2016   Noted from Mri and xrays   Body tinea 10/01/2015   Previously treated by Dr. Koleen Nimrod    Cancer [facility]) 09/2015   Had full mascetomy   COVID-19 02/23/2021   MAB infusion 01/23/2021   GERD (gastroesophageal reflux disease)    History of breast cancer 09/2015   breast cancer   History of chicken pox    History of peptic ulcer disease 09/25/2015   Hyperlipidemia    Hypertension    Hypothyroidism    Neuropathy of foot    bilateral   Pinched nerve    PONV (postoperative nausea and vomiting)    exploratory surgery at [facility]   Thyroid disease    Tuberculosis exposure    Mother had TB.  Pt shows (+) on tests, but has never had.   Vertigo    last episode over 1 yr ago   Wears dentures    partial upper and lower   Past Surgical History:  Procedure Laterality Date   ABDOMINAL HYSTERECTOMY  07/13/2013   Total. with BSO for postmenopausal bleeding and fibroids with large cervical polyp. Dr. Levy Sjogren and Dr. Glennon Mac at [place]  04/17/2011   Hepatomegaly with borderline slenomegaly. Suggestive of fatty liver. s/p cholecystectomy. Portions of aorta obscured   BREAST SURGERY Right 2011   BREAST BIOPSY   CESAREAN SECTION  1988   CHOLECYSTECTOMY  1990   COLONOSCOPY WITH PROPOFOL N/A 09/01/2017   Procedure: COLONOSCOPY WITH PROPOFOL;  Surgeon: [name], MD;  Location: [place];  Service: Gastroenterology;  Laterality: N/A;   DIAGNOSTIC LAPAROSCOPY     ESOPHAGOGASTRODUODENOSCOPY (EGD) WITH PROPOFOL N/A 01/17/2020   Procedure: ESOPHAGOGASTRODUODENOSCOPY (EGD) WITH PROPOFOL;  Surgeon: [name], MD;  Location: [facility];  Service: Endoscopy;  Laterality: N/A;   EXPLORATORY LAPAROTOMY     Left breast   MASTECTOMY     MASTECTOMY W/ SENTINEL NODE BIOPSY Bilateral 10/28/2015   Procedure: MASTECTOMY WITH SENTINEL LYMPH NODE BIOPSY;  Surgeon: Hubbard Robinson, MD;  Location: [facility];  Service: General;  Laterality: Bilateral;   PORT-A-CATH REMOVAL Left 10/11/2016   Procedure: REMOVAL PORT-A-CATH;  Surgeon: Hubbard Robinson, MD;  Location: [facility];  Service: General;  Laterality: Left;   PORTACATH PLACEMENT Left 12/23/2015   Procedure: INSERTION PORT-A-CATH;  Surgeon: Hubbard Robinson, MD;  Location: [facility];  Service: General;  Laterality: Left;   WRIST SURGERY      Allergies  Allergies  Allergen Reactions   Penicillins Shortness Of Breath    Has patient had a PCN reaction causing immediate rash, facial/tongue/throat swelling, SOB or lightheadedness with hypotension: Yes Has patient had a PCN reaction causing severe rash involving mucus membranes or skin necrosis: No Has patient had a PCN reaction that required hospitalization No Has patient had a PCN reaction occurring within the last 10 years: No If all of the above answers  are "NO", then may proceed with Cephalosporin use.    Nickel Hives   Keflex [Cephalexin] Swelling    Throat swelling   Pravastatin Sodium Other (See Comments)    Myalgia     History of Present Illness    BERENISE HUNTON has a past medical history of: Hypertension. Hyperlipidemia. Lipid panel 06/26/2022: LDL 154, HDL 26, TG 124, total 205.  Palpitations.  Pancreatitis.  Breast CA. S/p bilateral mastectomy and radiation.  Neuropathy.   Hypothyroidism. GERD.   Ms. Godino was first evaluated by Dr. Rockey Situ on  08/03/2021 for hypertension and lower extremity edema at the request of PCP, Dr. Caryn Section. At that time, patient complained of low diastolic BP and lower extremity edema. Recommendations included taking HCTZ in the AM and atenolol at night. If SBP >150 may take extra dose of HCTZ or extra half dose of atenolol. No new medications added at the request of patient. Lower extremity edema was thought to be exacerbated by weight and venous insufficiency. Recommended leg elevation and compression hose. Patient was last seen in the office on 06/24/2022 by Christell Faith, PA-C after being hospitalized for pancreatitis and taken off HCTZ. Patient was continued on atenolol and instructed to closely monitor BP and continue making lifestyle changes to include sodium restriction and weight loss.   Today, patient reports hypertension. She brings in a BP log from 11/25/2022 to this morning. Readings range from 170s/80s to 130s/50s. Some readings were taken before medication and some an hour or two after. She denies associated headaches, dizziness, or vision changes. She did see her eye doctor recently and was told she has dry eye and a worsening cataract in her right eye. Patient denies shortness of breath or dyspnea on exertion. No chest pain, pressure, or tightness. Denies lower extremity edema, orthopnea, or PND. No palpitations. She does not exercise and has not been following a heart healthy diet. Discussed her cholesterol panel from July. She does not want to start on medication, ash she feels she can get her cholesterol down with diet. Her PCP provided her with information for the DASH diet and she plans to start that and getting regular exercise at the start of the new year.       Home Medications    Current Meds  Medication Sig   atenolol (TENORMIN) 25 MG tablet TAKE 1/2 TABLET IN THE MORNING.    Carboxymeth-Glycerin-Polysorb (REFRESH OPTIVE MEGA-3 OP) Place 1 drop into both eyes daily.   docusate sodium (COLACE) 100 MG  capsule Take 200 mg by mouth daily. 2 in am, 1 at night   fexofenadine (ALLEGRA) 60 MG tablet Take 60 mg by mouth daily.   fluticasone (FLONASE) 50 MCG/ACT nasal spray Place 1 spray into both nostrils daily.   lansoprazole (PREVACID) 30 MG capsule TAKE 1 CAPSULE DAILY AT 12 NOON (NEED OFFICE VISIT PRIOR TO FURTHER REFILLS)   levothyroxine (SYNTHROID) 75 MCG tablet TAKE 1 TABLET DAILY   meclizine (ANTIVERT) 12.5 MG tablet TAKE 1 TABLET(12.5 MG) BY MOUTH THREE TIMES DAILY AS NEEDED FOR DIZZINESS   triamcinolone (KENALOG) 0.025 % cream Apply 1 Application topically 2 (two) times daily.   Vitamin D, Ergocalciferol, (DRISDOL) 1.25 MG (50000 UNIT) CAPS capsule TAKE 1 CAPSULE BY MOUTH 1 TIME A WEEK    Family History    Family History  Problem Relation Age of Onset   Hypertension Mother    Thyroid disease Mother    Hypertension Brother    Diabetes Brother    Breast cancer  Neg Hx    Ovarian cancer Neg Hx    Colon cancer Neg Hx    She indicated that her mother is deceased. She indicated that her father is deceased. She indicated that her brother is deceased. She indicated that her daughter is alive. She indicated that the status of her neg hx is unknown.   Social History    Social History   Socioeconomic History   Marital status: Married    Spouse name: Not on file   Number of children: 2   Years of education: Not on file   Highest education level: Some college, no degree  Occupational History   Occupation: retired  Tobacco Use   Smoking status: Never   Smokeless tobacco: Never  Vaping Use   Vaping Use: Never used  Substance and Sexual Activity   Alcohol use: Never   Drug use: Never   Sexual activity: Not Currently    Birth control/protection: Post-menopausal  Other Topics Concern   Not on file  Social History Narrative   Not on file   Social Determinants of Health   Financial Resource Strain: Low Risk  (09/16/2022)   Overall Financial Resource Strain (CARDIA)     Difficulty of Paying Living Expenses: Not very hard  Food Insecurity: No Food Insecurity (09/16/2022)   Hunger Vital Sign    Worried About Running Out of Food in the Last Year: Never true    [place] in the Last Year: Never true  Transportation Needs: No Transportation Needs (09/16/2022)   PRAPARE - Hydrologist (Medical): No    Lack of Transportation (Non-Medical): No  Physical Activity: Insufficiently Active (09/16/2022)   Exercise Vital Sign    Days of Exercise per Week: 2 days    Minutes of Exercise per Session: 20 min  Stress: No Stress Concern Present (09/16/2022)   [place]    Feeling of Stress : Only a little  Social Connections: Moderately Integrated (09/16/2022)   Social Connection and Isolation Panel [NHANES]    Frequency of Communication with Friends and Family: More than three times a week    Frequency of Social Gatherings with Friends and Family: Twice a week    Attends Religious Services: More than 4 times per year    Active Member of [employer] or Organizations: No    Attends Archivist Meetings: Never    Marital Status: Married  Human resources officer Violence: Not At Risk (09/16/2022)   Humiliation, Afraid, Rape, and Kick questionnaire    Fear of Current or Ex-Partner: No    Emotionally Abused: No    Physically Abused: No    Sexually Abused: No     Review of Systems    General:  No chills, fever, night sweats or weight changes.  Cardiovascular:  No chest pain, dyspnea on exertion, edema, orthopnea, palpitations, paroxysmal nocturnal dyspnea. Dermatological: No rash, lesions/masses Respiratory: No cough, dyspnea Urologic: No hematuria, dysuria Abdominal:   No nausea, vomiting, diarrhea, bright red blood per rectum, melena, or hematemesis Neurologic:  No visual changes, weakness, changes in mental status. All other systems reviewed and are otherwise negative  except as noted above.  Physical Exam    VS:  BP (!) 165/97 (BP Location: Left Arm, Patient Position: Sitting, Cuff Size: Normal)   Pulse 69   Ht '5\' 6"'$  (1.676 m)   Wt 298 lb 6.4 oz (135.4 kg)   SpO2 98%   BMI 48.16  kg/m  , BMI Body mass index is 48.16 kg/m. GEN:  Well nourished, well developed, in no acute distress. HEENT: Normal. Neck: Supple, no JVD, carotid bruits, or masses. Cardiac: RRR, no murmurs, rubs, or gallops. No clubbing, cyanosis, edema.  Radials/DP/PT 2+ and equal bilaterally.  Respiratory:  Respirations regular and unlabored, clear to auscultation bilaterally. GI: Soft, nontender, nondistended. MS: No deformity or atrophy. Skin: Warm and dry, no rash. Neuro: Strength and sensation are intact. Psych: Normal affect.  Accessory Clinical Findings    Recent Labs: 06/26/2022: Magnesium 2.2 10/05/2022: ALT 17; BUN 13; Creatinine, Ser 0.86; Hemoglobin 11.4; Platelets 195; Potassium 3.7; Sodium 137; TSH 2.113   Recent Lipid Panel    Component Value Date/Time   CHOL 205 (H) 06/26/2022 0705   CHOL 266 (H) 09/17/2021 0826   TRIG 124 06/26/2022 0705   HDL 26 (L) 06/26/2022 0705   HDL 41 09/17/2021 0826   CHOLHDL 7.9 06/26/2022 0705   VLDL 25 06/26/2022 0705   LDLCALC 154 (H) 06/26/2022 0705   LDLCALC 178 (H) 09/17/2021 0826    HYPERTENSION CONTROL Vitals:   11/30/22 1417 11/30/22 1606  BP: (!) 165/97 (!) 142/60    The patient's blood pressure is elevated above target today.  In order to address the patient's elevated BP: A new medication was prescribed today.       ECG is not indicated today.    Assessment & Plan   Hypertension. BP today 165/97 at intake, 142/60 at recheck. Patient denies headaches, dizziness, or vision changes. 7-day BP log shows BP consistently >130/80. Will start Valsartan 40 mg bid. Continue atenolol 12.5 mg daily. She will keep a BP and call if her BP continues to be >130/80. BMP in 2 weeks.   Hyperlipidemia. LDL 06/26/2022 154, not  at goal. Discussed starting on cholesterol medication. Patient declines at this time. She states her PCP follows her for this and provided her with information for the DASH diet to see if she can get it down through dietary changes. She plans to start following this diet and a more regular exercise program at the start of the new year. Her PCP will continue to follow her for this.       Disposition: Start Valsartan 40 mg bid. BMP in two weeks. Return in 3 months or sooner as needed.    Justice Britain. Brocha Gilliam, DNP, NP-C     11/30/2022, 3:09 PM [place] [address] Office [phone] Fax [phone]   I spent 15 minutes examining this patient, reviewing medications, and using patient centered shared decision making involving her cardiac care.  Prior to her visit I spent greater than 20 minutes reviewing her past medical history,  medications, and prior cardiac tests.

## 2022-11-30 ENCOUNTER — Ambulatory Visit: Payer: Medicare Other | Attending: Cardiology | Admitting: Student

## 2022-11-30 ENCOUNTER — Encounter: Payer: Self-pay | Admitting: Cardiology

## 2022-11-30 ENCOUNTER — Telehealth: Payer: Self-pay | Admitting: *Deleted

## 2022-11-30 VITALS — BP 142/60 | HR 69 | Ht 66.0 in | Wt 298.4 lb

## 2022-11-30 DIAGNOSIS — E7800 Pure hypercholesterolemia, unspecified: Secondary | ICD-10-CM | POA: Insufficient documentation

## 2022-11-30 DIAGNOSIS — I10 Essential (primary) hypertension: Secondary | ICD-10-CM | POA: Insufficient documentation

## 2022-11-30 MED ORDER — VALSARTAN 40 MG PO TABS: 40.0000 mg | ORAL_TABLET | Freq: Two times a day (BID) | ORAL | 1 refills | Status: DC

## 2022-11-30 MED ORDER — VALSARTAN 40 MG PO TABS: 40.0000 mg | ORAL_TABLET | Freq: Every day | ORAL | 1 refills | Status: DC

## 2022-11-30 NOTE — Telephone Encounter (Signed)
I spoke with pt to discuss Valsartan 40 mg instructions and she is aware that she should start taking Valsartan 40 mg twice daily. Pt aware that new Rx and pharmacy has been contacted. Pt is agreeable.

## 2022-11-30 NOTE — Telephone Encounter (Signed)
Per Kaylee Reel, NP to contact and change Valsartan 40 mg instructions from qd to twice daily per secured chat.   Instructions different on AVS.

## 2022-11-30 NOTE — Telephone Encounter (Signed)
I contacted pharmacy to cancel New Rx for Valsartan 40 mg qd. Pharmacy made aware that Rx should read Valsartan 40 mg tablet twice daily. They requested new Rx for correct medication and instructions. New Rx sent.

## 2022-11-30 NOTE — Patient Instructions (Addendum)
Medication Instructions:   START Valsartan 40 mg tablet by mouth once daily.  *If you need a refill on your cardiac medications before your next appointment, please call your pharmacy*   Lab Work:  Your physician recommends that you return for lab work in: 2 weeks at [facility].  -BMP  If you have labs (blood work) drawn today and your tests are completely normal, you will receive your results only by: [place] (if you have MyChart) OR A paper copy in the mail If you have any lab test that is abnormal or we need to change your treatment, we will call you to review the results.   Testing/Procedures:  NONE   Follow-Up: At [facility], you and your health needs are our priority.  As part of our continuing mission to provide you with exceptional heart care, we have created designated Provider Care Teams.  These Care Teams include your primary Cardiologist (physician) and Advanced Practice Providers (APPs -  Physician Assistants and Nurse Practitioners) who all work together to provide you with the care you need, when you need it.  We recommend signing up for the patient portal called "MyChart".  Sign up information is provided on this After Visit Summary.  MyChart is used to connect with patients for Virtual Visits (Telemedicine).  Patients are able to view lab/test results, encounter notes, upcoming appointments, etc.  Non-urgent messages can be sent to your provider as well.   To learn more about what you can do with MyChart, go to [email or web address].    Your next appointment:   2-3 month(s)  The format for your next appointment:   In Person  Provider:   You may see Ida Rogue, MD or one of the following Advanced Practice Providers on your designated Care Team:   Murray Hodgkins, NP Christell Faith, PA-C Cadence Kathlen Mody, PA-C Gerrie Nordmann, NP    Other Instructions  Please keep Blood pressure log for 2 weeks check blood pressure once daily 2 hours  after taking blood pressure medication.  Please contact office if Blood pressure is > 130/80 in 2 weeks [phone].  Important Information About Sugar

## 2022-12-15 ENCOUNTER — Other Ambulatory Visit
Admission: RE | Admit: 2022-12-15 | Discharge: 2022-12-15 | Disposition: A | Discharge status: Home / Self Care | Payer: Medicare Other | Source: Ambulatory Visit | Attending: Student | Admitting: Student

## 2022-12-15 DIAGNOSIS — I10 Essential (primary) hypertension: Secondary | ICD-10-CM

## 2022-12-15 DIAGNOSIS — E7800 Pure hypercholesterolemia, unspecified: Secondary | ICD-10-CM

## 2022-12-15 LAB — BASIC METABOLIC PANEL
Anion gap: 8 (ref 5–15)
BUN: 14 mg/dL (ref 8–23)
CO2: 26 mmol/L (ref 22–32)
Calcium: 9 mg/dL (ref 8.9–10.3)
Chloride: 104 mmol/L (ref 98–111)
Creatinine, Ser: 0.94 mg/dL (ref 0.44–1.00)
GFR, Estimated: >60 mL/min (ref >60)
Glucose, Bld: 115 mg/dL — ABNORMAL HIGH (ref 70–99)
Potassium: 4.3 mmol/L (ref 3.5–5.1)
Sodium: 138 mmol/L (ref 135–145)

## 2022-12-15 NOTE — Telephone Encounter (Signed)
BP (HR) readings obtained from nurse bin.  12/02/22 (10:00 am)- 166/70 (71) 12/03/22 (10:27 am)- 160/81 (78) 12/04/22 (10:00 am)- 171/79 (63) 12/05/22 (1:58 pm) - 162/68 (85) 12/06/22 (10:00 am)- 141/70 (71) 12/07/22 (10:00 am)- 157/72 (65) 12/08/22 (10:49 am)- 173/82 (77)  12/09/22 (10:02 am)- 175/77 (75) 12/10/22 (10:30 am)- 156/66 (69) 12/11/22 (10:35 am)- 157/40 (71) 12/12/22 (9:10 am)- 151/69 (71) 12/13/22 (10:16 am)- 150/62 (69) 12/14/22 (10:00 am)- 154/67 (67)  Per the patient's BP log, all of the above readings were taken at least 2 hours after her medications.   The patient was last seen by Mayra Reel, NP on 11/30/22 in the [place] office. She was started on Valsartan 40 mg BID at that time.  She did have her repeat BMP today.   To provider to review readings and provide any further recommendations.

## 2022-12-15 NOTE — Telephone Encounter (Signed)
Patient brought in blood pressure log for clinical staff to review. Placed in nurse box.

## 2022-12-16 ENCOUNTER — Telehealth: Payer: Self-pay | Admitting: Cardiovascular Disease

## 2022-12-16 DIAGNOSIS — I10 Essential (primary) hypertension: Secondary | ICD-10-CM

## 2022-12-16 DIAGNOSIS — Z79899 Other long term (current) drug therapy: Secondary | ICD-10-CM

## 2022-12-16 MED ORDER — VALSARTAN 40 MG PO TABS: 80.0000 mg | ORAL_TABLET | Freq: Two times a day (BID) | ORAL | 1 refills | Status: DC

## 2022-12-16 NOTE — Telephone Encounter (Signed)
Mayra Reel, NP 12/16/2022 11:23 AM EST     Please let patient know her kidney function and electrolytes are all normal. She sent in her BP log and she remains hypertensive. I would like her to increase Valsartan to 80 mg bid. Continue to keep BP log 2 hours after taking medications. Repeat BMP in 2 weeks. Thank you! ~DW     BP readings sent in on 12/15/22: BP (HR) readings obtained from nurse bin.   12/02/22 (10:00 am)- 166/70 (71) 12/03/22 (10:27 am)- 160/81 (78) 12/04/22 (10:00 am)- 171/79 (63) 12/05/22 (1:58 pm) - 162/68 (85) 12/06/22 (10:00 am)- 141/70 (71) 12/07/22 (10:00 am)- 157/72 (65) 12/08/22 (10:49 am)- 173/82 (77)  12/09/22 (10:02 am)- 175/77 (75) 12/10/22 (10:30 am)- 156/66 (69) 12/11/22 (10:35 am)- 157/40 (71) 12/12/22 (9:10 am)- 151/69 (71) 12/13/22 (10:16 am)- 150/62 (69) 12/14/22 (10:00 am)- 154/67 (67)   Per the patient's BP log, all of the above readings were taken at least 2 hours after her medications.    The patient was last seen by Mayra Reel, NP on 11/30/22 in the [place] office. She was started on Valsartan 40 mg BID at that time.  She did have her repeat BMP today.    To provider to review readings and provide any further recommendations.

## 2022-12-16 NOTE — Telephone Encounter (Signed)
Patient is calling requesting a callback on what to do for her BP. States she dropped off a log of her BP readings yesterday and does not feel her medications are helping. Please advise.

## 2022-12-16 NOTE — Telephone Encounter (Signed)
I spoke with the patient regarding her lab results and Neoma Laming, NP's recommendations to: 1) INCREASE Valsartan to 80 mg BID 2) keep a BP log as she has been doing ~ 2 hours after AM dose of medication 3) repeat a BMP in 2 weeks [facility])  The patient voices understanding of all of the above and is agreeable. She states she has a #90 supply of the valsartan 40 mg RX, so she will double up on her current RX and take valsartan 40 mg- 2 tablets (80 mg) twice daily.  She was very appreciative of the call back.   BMP order placed.

## 2022-12-29 ENCOUNTER — Other Ambulatory Visit
Admission: RE | Admit: 2022-12-29 | Discharge: 2022-12-29 | Disposition: A | Discharge status: Home / Self Care | Payer: Medicare Other | Attending: Student | Admitting: Student

## 2022-12-29 DIAGNOSIS — I10 Essential (primary) hypertension: Secondary | ICD-10-CM | POA: Diagnosis not present

## 2022-12-29 DIAGNOSIS — Z79899 Other long term (current) drug therapy: Secondary | ICD-10-CM | POA: Diagnosis not present

## 2022-12-29 LAB — BASIC METABOLIC PANEL
Anion gap: 8 (ref 5–15)
BUN: 16 mg/dL (ref 8–23)
CO2: 24 mmol/L (ref 22–32)
Calcium: 8.7 mg/dL — ABNORMAL LOW (ref 8.9–10.3)
Chloride: 104 mmol/L (ref 98–111)
Creatinine, Ser: 0.86 mg/dL (ref 0.44–1.00)
GFR, Estimated: >60 mL/min (ref >60)
Glucose, Bld: 105 mg/dL — ABNORMAL HIGH (ref 70–99)
Potassium: 4.2 mmol/L (ref 3.5–5.1)
Sodium: 136 mmol/L (ref 135–145)

## 2022-12-29 MED ORDER — DOXAZOSIN MESYLATE 1 MG PO TABS: 1.0000 mg | ORAL_TABLET | Freq: Two times a day (BID) | ORAL | 3 refills | Status: DC

## 2022-12-29 NOTE — Telephone Encounter (Signed)
Pt made aware of MD's recommendations and verbalized understanding   Gollan, Kathlene November, MD  [facility] [phone] minutes ago (2:50 PM)    Have not seen her since August 2022, lots has happened since then Several options for blood pressure control will Try to avoid calcium channel blockers given propensity for leg swelling Would recommend we start Cardura/doxazosin 1 mg twice daily  Other options for blood pressure medication include hydralazine, long-acting nitrates, clonidine, Or changing atenolol to different beta-blocker that is more effective for blood pressure Thx TG

## 2022-12-29 NOTE — Telephone Encounter (Signed)
Pt brought in bp log for provider to keep. Pt also wanted to let us know that she had lab work done this morning. Put in nurse box.

## 2022-12-29 NOTE — Telephone Encounter (Signed)
Updated BP (HR) readings obtained from nurse bin:  The patient was advised to increased valsartan to 80 mg BID on 12/16/22.  BMP done today and results currently pending  12/17/22 (10:00 am)- 156/86 (68) 12/18/22 (11:05 am)- 145/47 (74) 12/19/22 (9:15 am)- 141/61 (67) 12/20/22 (10:20 am)- 167/61 (64)  12/21/22 (10:00 am)- 160/68 (74) 12/22/22 - no BP check (not at home) 12/23/22 (10:35 am)- 153/74 (71) 12/24/22 (12:11 pm)- 169/65 (68) 12/25/22 (10:29 am)- 137/58 (63) 12/26/22- no BP check (not at home) 12/27/22 (2:00 pm)- 148/77 (67) 12/28/22 (9:45 am)- 178/80 (69)  Per the patient's BP log- all of the above readings were taken at least 2 hours after medications.   Forwarding to Mayra Reel, NP &  the patient's primary cardiologist Dr. Rockey Situ to review. The patient is scheduled to follow up with Dr. Rockey Situ on 02/15/23

## 2022-12-29 NOTE — Addendum Note (Signed)
Addended by: Meryl Crutch on: 12/29/2022 03:32 PM   Modules accepted: Orders

## 2022-12-30 ENCOUNTER — Telehealth: Payer: Self-pay

## 2022-12-30 ENCOUNTER — Telehealth: Payer: Self-pay | Admitting: Cardiovascular Disease

## 2022-12-30 NOTE — Telephone Encounter (Signed)
The patient has been made aware about the approval for the Cardura.   She also stated that she will be having glaucoma surgery in March and she read that Cardura should be held for two weeks prior due to it causing complications. If this is the case, she wants to know if she will need to take something in its place. She has been advised to monitor her blood pressure and contact us when she has a date for the procedure.

## 2022-12-30 NOTE — Telephone Encounter (Signed)
Pt c/o medication issue:  1. Name of Medication: doxazosin (CARDURA) 1 MG tablet   2. How are you currently taking this medication (dosage and times per day)?   Take 1 tablet (1 mg total) by mouth 2 (two) times daily.    3. Are you having a reaction (difficulty breathing--STAT)? no  4. What is your medication issue? Patient states that her insurance has issues with the way that the prescription was write. Calling to see what other option. Please advise

## 2022-12-30 NOTE — Telephone Encounter (Signed)
Prior Authorization for doxazosin 1 mg tablets BID initiated by [email or web address].  KEY: [id number]  Response: This request has received a Favorable outcome. [id number];Review Type:Prior Auth;Coverage Start Date:11/30/2022;Coverage End Date:12/30/2023

## 2022-12-30 NOTE — Telephone Encounter (Signed)
Left a message for the patient to call back.    Prior Authorization for doxazosin 1 mg tablets BID initiated by [email or web address].   KEY: [id number]   Response: This request has received a Favorable outcome. [id number];Review Type:Prior Auth;Coverage Start Date:11/30/2022;Coverage End Date:12/30/2023

## 2022-12-30 NOTE — Telephone Encounter (Signed)
Pt is returning call. Transferred to Lattie Haw, Therapist, sports.

## 2023-01-03 NOTE — Telephone Encounter (Signed)
Pt calling for an update.

## 2023-01-03 NOTE — Telephone Encounter (Signed)
[name] R  Sent: Mon January 03, 2023  8:27 AM  To: [facility] Triage         Message  ----- Message from Foye Clock sent at 01/03/2023  8:27 AM EST -----    Although there is some debate whether Cardura needs to be held before eye surgery, safest thing is probably to wait until after her eye surgery before starting Cardura  For elevated blood pressure, would stop atenolol, changed to carvedilol 12.5 twice daily  Coreg can have more blood pressure lowering effect then atenolol  Would monitor heart rate and blood pressure  Thx  TGollan

## 2023-01-03 NOTE — Telephone Encounter (Signed)
I called and spoke with the patient regarding Dr. Donivan Scull recommendations for Cardura as stated below. The patient advised that she actually took 2 doses of the Cardura and she did not feel well. Her heart was racing/ pounding.  She prefers not to take this medication.  She did restart Valsartan 80 mg BID today and her blood pressures this morning were 136/55 & 134/60.  When she previously took this, she had some pain behind one of her eyes (the opposite eye pending cataract surgery).  However, she has not really noticed any eye pain today. She will continue to re-trail valsartan 80 mg BID at this time.  She is aware if she does not tolerate valsartan, then Dr. Rockey Situ has recommended to:  - stop atenolol - start coreg 12.5 mg BID - monitor HR/ BP  The patient will research coreg and continue to monitor how she is doing on valsartan. She will call back as needed.  She is aware I will add Cardura to her allergy list as an intolerance.   The patient voices understanding and is agreeable.

## 2023-01-10 ENCOUNTER — Other Ambulatory Visit: Payer: Self-pay

## 2023-01-10 ENCOUNTER — Telehealth: Payer: Self-pay | Admitting: Cardiovascular Disease

## 2023-01-10 ENCOUNTER — Other Ambulatory Visit: Payer: Self-pay | Admitting: Student

## 2023-01-10 MED ORDER — VALSARTAN 40 MG PO TABS: 80.0000 mg | ORAL_TABLET | Freq: Two times a day (BID) | ORAL | 1 refills | Status: DC

## 2023-01-10 NOTE — Telephone Encounter (Signed)
Pt c/o medication issue:  1. Name of Medication: valsartan (DIOVAN) 40 MG tablet   2. How are you currently taking this medication (dosage and times per day)?   Take 2 tablets (80 mg total) by mouth 2 (two) times daily.    3. Are you having a reaction (difficulty breathing--STAT)? No  4. What is your medication issue?  Pt would like to know if instructions can be changed to 80 MG twice daily. If so she would ike for refill to be sent to pharmacy on file. Please advise

## 2023-01-10 NOTE — Progress Notes (Signed)
Pt would like to know if instructions can be changed to 80 MG twice daily. If so she would ike for refill to be sent to pharmacy on file.

## 2023-01-17 ENCOUNTER — Other Ambulatory Visit: Payer: Self-pay

## 2023-01-17 ENCOUNTER — Telehealth: Payer: Self-pay | Admitting: Cardiovascular Disease

## 2023-01-17 MED ORDER — VALSARTAN 80 MG PO TABS: 80.0000 mg | ORAL_TABLET | Freq: Two times a day (BID) | ORAL | 3 refills | Status: DC

## 2023-01-17 NOTE — Telephone Encounter (Signed)
Pt calling to f/u on call from last week regarding medication. Pt would also like to know if medication can be changed. Please advise

## 2023-01-17 NOTE — Telephone Encounter (Signed)
Spoke with patient and she is concerned that her insurance will not pay for the valsartan (DIOVAN) 40 MG tablet Take 2 tablets (80 mg total) by mouth 2 (two) times daily. She is going to call insurance company to see if they will cover a 80 mg tablet two times daily instead of the four 40 mg tablets. Patient will call back after calling insurance company.

## 2023-01-17 NOTE — Telephone Encounter (Signed)
Patient stated that she spoke with her insurance and they advised they would cover it if written different for two tablets of 80 mg daily. Prescription changed per patient request. Patient satisfied with result.

## 2023-01-17 NOTE — Telephone Encounter (Signed)
Patient is calling back regarding previous conversation with Floreen Comber. She reports she spoke with her insurance and they advised they would cover it if written different for two tablets of 80 mg's daily. Please advise.

## 2023-01-27 DIAGNOSIS — H16223 Keratoconjunctivitis sicca, not specified as Sjogren's, bilateral: Secondary | ICD-10-CM | POA: Diagnosis not present

## 2023-01-27 DIAGNOSIS — H2513 Age-related nuclear cataract, bilateral: Secondary | ICD-10-CM | POA: Diagnosis not present

## 2023-01-27 DIAGNOSIS — H04123 Dry eye syndrome of bilateral lacrimal glands: Secondary | ICD-10-CM | POA: Diagnosis not present

## 2023-02-08 DIAGNOSIS — H18413 Arcus senilis, bilateral: Secondary | ICD-10-CM | POA: Diagnosis not present

## 2023-02-08 DIAGNOSIS — H25013 Cortical age-related cataract, bilateral: Secondary | ICD-10-CM | POA: Diagnosis not present

## 2023-02-08 DIAGNOSIS — H25043 Posterior subcapsular polar age-related cataract, bilateral: Secondary | ICD-10-CM | POA: Diagnosis not present

## 2023-02-08 DIAGNOSIS — H2511 Age-related nuclear cataract, right eye: Secondary | ICD-10-CM | POA: Diagnosis not present

## 2023-02-08 DIAGNOSIS — H2513 Age-related nuclear cataract, bilateral: Secondary | ICD-10-CM | POA: Diagnosis not present

## 2023-02-14 NOTE — Progress Notes (Unsigned)
Cardiology Office Note  Date:  02/15/2023   ID:  Kaylee Reyes, DOB 04-May-1951, MRN [id number]  PCP:  Birdie Sons, MD   Chief Complaint  Patient presents with   2-3 month follow up     HPI:  Kaylee Reyes is a 72 year old woman with past medical history of Hypertension Breast cancer Obesity Covid feb 2022 Hyperlipidemia Aortic atherosclerosis, moderate, on CT scan in Who presents for follow-up of her hypertension, leg swelling  Last seen by myself in clinic August 2022 Seen by one of our providers December 2023  on valsartan 80 twice daily, off atenolol 12.5 daily  Side effects on cardura: tachycardia HCTZ" pancreatitis Atenolol: stopped Recently tried on Triamterene, did not seem to work very well  CT scan: Images pulled up and reviewed showing moderate descending aorta atherosclerosis extending into the common iliac arteries bilaterally  Reports having statin myalgias  Concerned about chronically low diastolic pressures, Systolic pressures on review of her numbers typically [id number] up to 0000000 systolic, often [id number] systolic Weight gain, reports weight has never been as high  History of elevated LFTs, 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  Prior hx of chemo, Chronic leg swelling Neuropathy , sleep issues Prior prescription of Neurontin, did not try it but is interested again Finishing "caner pill"/letrozole, has completed 5 years  Echo reviewed NORMAL LEFT VENTRICULAR SYSTOLIC FUNCTION  NORMAL RIGHT VENTRICULAR SYSTOLIC FUNCTION  TRIVIAL REGURGITATION NOTED (See above)  NO VALVULAR STENOSIS     PMH:   has a past medical history of Allergy, Anemia, Arthritis (2016), Body tinea (10/01/2015), Cancer ([place]) (09/2015), COVID-19 (02/23/2021), GERD (gastroesophageal reflux disease), History of breast cancer (09/2015), History of chicken pox, History of peptic ulcer disease (09/25/2015), Hyperlipidemia, Hypertension, Hypothyroidism, Neuropathy of foot, Pinched nerve, PONV (postoperative  nausea and vomiting), Thyroid disease, Tuberculosis exposure, Vertigo, and Wears dentures.  PSH:    Past Surgical History:  Procedure Laterality Date   ABDOMINAL HYSTERECTOMY  07/13/2013   Total. with BSO for postmenopausal bleeding and fibroids with large cervical polyp. Dr. Levy Sjogren and Dr. Glennon Mac at [place]  04/17/2011   Hepatomegaly with borderline slenomegaly. Suggestive of fatty liver. s/p cholecystectomy. Portions of aorta obscured   BREAST SURGERY Right 2011   BREAST BIOPSY   CESAREAN SECTION  1988   CHOLECYSTECTOMY  1990   COLONOSCOPY WITH PROPOFOL N/A 09/01/2017   Procedure: COLONOSCOPY WITH PROPOFOL;  Surgeon: [name], MD;  Location: [facility];  Service: Gastroenterology;  Laterality: N/A;   DIAGNOSTIC LAPAROSCOPY     ESOPHAGOGASTRODUODENOSCOPY (EGD) WITH PROPOFOL N/A 01/17/2020   Procedure: ESOPHAGOGASTRODUODENOSCOPY (EGD) WITH PROPOFOL;  Surgeon: [name], MD;  Location: [facility];  Service: Endoscopy;  Laterality: N/A;   EXPLORATORY LAPAROTOMY     Left breast   MASTECTOMY     MASTECTOMY W/ SENTINEL NODE BIOPSY Bilateral 10/28/2015   Procedure: MASTECTOMY WITH SENTINEL LYMPH NODE BIOPSY;  Surgeon: Hubbard Robinson, MD;  Location: [facility];  Service: General;  Laterality: Bilateral;   PORT-A-CATH REMOVAL Left 10/11/2016   Procedure: REMOVAL PORT-A-CATH;  Surgeon: Hubbard Robinson, MD;  Location: [facility];  Service: General;  Laterality: Left;   PORTACATH PLACEMENT Left 12/23/2015   Procedure: INSERTION PORT-A-CATH;  Surgeon: Hubbard Robinson, MD;  Location: [facility];  Service: General;  Laterality: Left;   WRIST SURGERY      Current Outpatient Medications  Medication Sig Dispense Refill   Carboxymeth-Glycerin-Polysorb (REFRESH OPTIVE MEGA-3 OP) Place 1 drop into both eyes daily.  docusate sodium (COLACE) 100 MG capsule Take 200 mg by mouth daily. 2 in am, 1 at night     ezetimibe (ZETIA) 10 MG tablet Take 1 tablet (10 mg  total) by mouth daily. 90 tablet 3   fexofenadine (ALLEGRA) 60 MG tablet Take 60 mg by mouth daily.     fluticasone (FLONASE) 50 MCG/ACT nasal spray Place 1 spray into both nostrils daily. 11.1 mL 3   lansoprazole (PREVACID) 30 MG capsule TAKE 1 CAPSULE DAILY AT 12 NOON (NEED OFFICE VISIT PRIOR TO FURTHER REFILLS) 90 capsule 3   levothyroxine (SYNTHROID) 75 MCG tablet TAKE 1 TABLET DAILY 90 tablet 4   meclizine (ANTIVERT) 12.5 MG tablet TAKE 1 TABLET(12.5 MG) BY MOUTH THREE TIMES DAILY AS NEEDED FOR DIZZINESS 90 tablet 3   triamcinolone (KENALOG) 0.025 % cream Apply 1 Application topically 2 (two) times daily.     valsartan (DIOVAN) 80 MG tablet Take 1 tablet (80 mg total) by mouth 2 (two) times daily. 180 tablet 3   Vitamin D, Ergocalciferol, (DRISDOL) 1.25 MG (50000 UNIT) CAPS capsule TAKE 1 CAPSULE BY MOUTH 1 TIME A WEEK 12 capsule 1   ondansetron (ZOFRAN) 4 MG tablet Take 1 tablet (4 mg total) by mouth every 8 (eight) hours as needed for nausea or vomiting. (Patient not taking: Reported on 09/16/2022) 30 tablet 0   No current facility-administered medications for this visit.    Allergies:   Penicillins, Nickel, Cardura [doxazosin], Keflex [cephalexin], and Pravastatin sodium   Social History:  The patient  reports that she has never smoked. She has never used smokeless tobacco. She reports that she does not drink alcohol and does not use drugs.   Family History:   family history includes Diabetes in her brother; Hypertension in her brother and mother; Thyroid disease in her mother.    Review of Systems: Review of Systems  Constitutional: Negative.   HENT: Negative.    Respiratory: Negative.    Cardiovascular: Negative.   Gastrointestinal: Negative.   Musculoskeletal: Negative.   Neurological: Negative.   Psychiatric/Behavioral: Negative.    All other systems reviewed and are negative.   PHYSICAL EXAM: VS:  BP (!) 150/80 (BP Location: Left Wrist, Patient Position: Sitting, Cuff  Size: Normal)   Pulse 78   Ht '5\' 6"'$  (1.676 m)   Wt (!) 306 lb 4 oz (138.9 kg)   SpO2 98%   BMI 49.43 kg/m  , BMI Body mass index is 49.43 kg/m. Constitutional:  oriented to person, place, and time. No distress.  Obese HENT:  Head: Grossly normal Eyes:  no discharge. No scleral icterus.  Neck: No JVD, no carotid bruits  Cardiovascular: Regular rate and rhythm, no murmurs appreciated Pulmonary/Chest: Clear to auscultation bilaterally, no wheezes or rails Abdominal: Soft.  no distension.  no tenderness.  Musculoskeletal: Normal range of motion Neurological:  normal muscle tone. Coordination normal. No atrophy Skin: Skin warm and dry Psychiatric: normal affect, pleasant   Recent Labs: 06/26/2022: Magnesium 2.2 10/05/2022: ALT 17; Hemoglobin 11.4; Platelets 195; TSH 2.113 12/29/2022: BUN 16; Creatinine, Ser 0.86; Potassium 4.2; Sodium 136    Lipid Panel Lab Results  Component Value Date   CHOL 205 (H) 06/26/2022   HDL 26 (L) 06/26/2022   LDLCALC 154 (H) 06/26/2022   TRIG 124 06/26/2022      Wt Readings from Last 3 Encounters:  02/15/23 (!) 306 lb 4 oz (138.9 kg)  11/30/22 298 lb 6.4 oz (135.4 kg)  11/13/22 268 lb 1.3 oz (121.6 kg)  ASSESSMENT AND PLAN:  Problem List Items Addressed This Visit       Cardiology Problems   Essential hypertension - Primary   Relevant Medications   ezetimibe (ZETIA) 10 MG tablet   Hypercholesterolemia   Relevant Medications   ezetimibe (ZETIA) 10 MG tablet     Other   SOB (shortness of breath)   Other Visit Diagnoses     Leg swelling       Morbid obesity ([place])         Essential hypertension Currently taking valsartan 80 twice daily Systolic pressure is running high at home, chronically low diastolic pressures on her home blood pressure cuff She is reluctant to adjust medications at this time but willing to take extra valsartan 40 up to 80 mg for systolic pressure of high 150-160 Not on atenolol Does not appreciate  palpitations  Hyperlipidemia Moderate aortic atherosclerosis on recent CT scan abdomen Will start Zetia 10 mg daily, does not want a statin Reports history of statin myalgias  Morbid obesity We have encouraged continued exercise, careful diet management in an effort to lose weight.  Leg swelling Likely exacerbated by weight, venous insufficiency Mild symptoms    Total encounter time more than 30 minutes  Greater than 50% was spent in counseling and coordination of care with the patient    Signed, Esmond Plants, M.D., Ph.D. [place], [place]

## 2023-02-15 ENCOUNTER — Telehealth: Payer: Self-pay

## 2023-02-15 ENCOUNTER — Ambulatory Visit: Payer: Medicare Other | Attending: Cardiovascular Disease | Admitting: Cardiovascular Disease

## 2023-02-15 ENCOUNTER — Encounter: Payer: Self-pay | Admitting: Cardiovascular Disease

## 2023-02-15 ENCOUNTER — Other Ambulatory Visit (HOSPITAL_COMMUNITY): Payer: Self-pay

## 2023-02-15 ENCOUNTER — Telehealth: Payer: Self-pay | Admitting: Cardiovascular Disease

## 2023-02-15 VITALS — BP 150/80 | HR 78 | Ht 66.0 in | Wt 306.2 lb

## 2023-02-15 DIAGNOSIS — M791 Myalgia, unspecified site: Secondary | ICD-10-CM | POA: Diagnosis not present

## 2023-02-15 DIAGNOSIS — E7800 Pure hypercholesterolemia, unspecified: Secondary | ICD-10-CM | POA: Diagnosis not present

## 2023-02-15 DIAGNOSIS — T466X5A Adverse effect of antihyperlipidemic and antiarteriosclerotic drugs, initial encounter: Secondary | ICD-10-CM

## 2023-02-15 DIAGNOSIS — M7989 Other specified soft tissue disorders: Secondary | ICD-10-CM | POA: Diagnosis not present

## 2023-02-15 DIAGNOSIS — R0602 Shortness of breath: Secondary | ICD-10-CM | POA: Diagnosis not present

## 2023-02-15 DIAGNOSIS — I10 Essential (primary) hypertension: Secondary | ICD-10-CM | POA: Diagnosis not present

## 2023-02-15 MED ORDER — EZETIMIBE 10 MG PO TABS: 10.0000 mg | ORAL_TABLET | Freq: Every day | ORAL | 3 refills | Status: DC

## 2023-02-15 NOTE — Telephone Encounter (Signed)
Pt c/o medication issue:  1. Name of Medication:   ezetimibe (ZETIA) 10 MG tablet   2. How are you currently taking this medication (dosage and times per day)?   Not taking yet  3. Are you having a reaction (difficulty breathing--STAT)?  N/A  4. What is your medication issue?   Patient stated she will need a letter sent to her insurance requesting a tier exception for this medication because she cannot use statins. Patient stated can call tel# [phone] for further information on getting the tier exception.

## 2023-02-15 NOTE — Telephone Encounter (Signed)
Pharmacy Patient Advocate Encounter   Received notification [employer] that tier exception for EZETIMIBE 10 MG is needed.    02/15/23 VIA FAXED [employer] Status is pending  Karie Soda, CPhT Pharmacy Patient Advocate Specialist Direct Number: [phone] Fax: [phone]

## 2023-02-15 NOTE — Patient Instructions (Addendum)
Medication Instructions:  Please start zetia 10 mg daily  If you need a refill on your cardiac medications before your next appointment, please call your pharmacy.   Lab work: No new labs needed  Testing/Procedures: No new testing needed  Follow-Up: At [facility], you and your health needs are our priority.  As part of our continuing mission to provide you with exceptional heart care, we have created designated Provider Care Teams.  These Care Teams include your primary Cardiologist (physician) and Advanced Practice Providers (APPs -  Physician Assistants and Nurse Practitioners) who all work together to provide you with the care you need, when you need it.  You will need a follow up appointment in 6 months, APP ok  Providers on your designated Care Team:   Murray Hodgkins, NP Christell Faith, PA-C Cadence Kathlen Mody, [place]  COVID-19 Vaccine Information can be found at: [email or web address] For questions related to vaccine distribution or appointments, please email [email or web address] or call [phone].

## 2023-02-17 ENCOUNTER — Other Ambulatory Visit (HOSPITAL_COMMUNITY): Payer: Self-pay

## 2023-02-17 NOTE — Telephone Encounter (Signed)
Spoke with patient and informed her that the medication had been approved and is available to be picked up at her local pharmacy. Patient was satisfied with the response

## 2023-02-17 NOTE — Telephone Encounter (Signed)
Patient is requesting call back for update on prior auth for this medication and insurance coverage. Please advise.

## 2023-02-17 NOTE — Telephone Encounter (Addendum)
Have yet to receive any notification from [place] on status, but ran test claim, she's good to go.  Looks like they approved it. Cost at [facility] would be approx $16. Cost can vary across different pharmacies but should be close to that.

## 2023-02-22 ENCOUNTER — Other Ambulatory Visit: Payer: Self-pay | Admitting: Family Medicine

## 2023-02-22 ENCOUNTER — Telehealth: Payer: Self-pay | Admitting: Cardiovascular Disease

## 2023-02-22 NOTE — Telephone Encounter (Signed)
Pt c/o medication issue:  1. Name of Medication: valsartan (DIOVAN) 80 MG tablet   2. How are you currently taking this medication (dosage and times per day)?    3. Are you having a reaction (difficulty breathing--STAT)? no  4. What is your medication issue? Patient state the meds make her nauseate.  Calling to see if she can get some nausea medication. Please advise

## 2023-02-22 NOTE — Telephone Encounter (Signed)
Spoke w/ pt.  She reports that since she started taking valsartan last month, she has been experiencing nausea, primarily in the am. She has had cancer and the nausea is especially bothersome, so she would like an rx for zofran. She had some at home, but it was past it's expiration date, but she took it anyway and it helped a lot. Advised her that I will make Dr. Rockey Situ aware of her request and call her back w/ his recommendation.

## 2023-02-22 NOTE — Telephone Encounter (Signed)
Medication Refill - Medication:  meclizine (ANTIVERT) 12.5 MG tablet   Has the patient contacted their pharmacy? No. (Agent: If no, request that the patient contact the pharmacy for the refill. If patient does not wish to contact the pharmacy document the reason why and proceed with request.) (Agent: If yes, when and what did the pharmacy advise?)  Preferred Pharmacy (with phone number or street name):  [facility], [place] [facility] [address] Phone: [phone]  Fax: [phone]     Has the patient been seen for an appointment in the last year OR does the patient have an upcoming appointment? Yes.    Agent: Please be advised that RX refills may take up to 3 business days. We ask that you follow-up with your pharmacy.

## 2023-02-23 NOTE — Telephone Encounter (Signed)
Spoke with patient and she requested something for nausea. Message sent over to Dr. Rockey Situ for his review but advised that she may want to check with her PCP office because we do not typically order that medication. Advised that once Dr. Rockey Situ replies we will give her a call back. She verbalized understanding with no further questions at this time.

## 2023-02-23 NOTE — Telephone Encounter (Signed)
Requested medication (s) are due for refill today: yes  Requested medication (s) are on the active medication list: yes    Last refill: 08/20/21  #90  3 refills  Future visit scheduled no  Notes to clinic:Not delegated, please review. Thank you.  Requested Prescriptions  Pending Prescriptions Disp Refills   meclizine (ANTIVERT) 12.5 MG tablet 90 tablet 3     Not Delegated - Gastroenterology: Antiemetics Failed - 02/22/2023  9:03 AM      Failed - This refill cannot be delegated      Passed - Valid encounter within last 6 months    Recent Outpatient Visits           7 months ago Essential hypertension   [facility] Mikey Kirschner, PA-C   8 months ago Other acute pancreatitis without infection or necrosis   [facility] Thedore Mins, [place], PA-C   8 months ago Other acute pancreatitis without infection or necrosis   [facility] Mikey Kirschner, PA-C   1 year ago Primary hypertension   [place], Donald E, MD   1 year ago Primary hypertension   [place], Donald E, MD

## 2023-02-23 NOTE — Telephone Encounter (Signed)
Pt called to f/u on receiving a callback about medication to help with nausea. Please advise.

## 2023-02-24 MED ORDER — MECLIZINE HCL 12.5 MG PO TABS: ORAL_TABLET | ORAL | 3 refills | Status: DC

## 2023-02-24 NOTE — Telephone Encounter (Signed)
Pt stated she reached out to her pcp for medication to help with nausea and feel like it could possibly be related to sinuses. Pt stated she would prefer to keep medication the same for now and see if symptoms get better with medication prescribed by pcp.   Minna Merritts, MD  [facility] Triage16 hours ago (6:21 PM)    If nausea is coming from valsartan, I think it is worth making a medication change My notes indicate she is taking valsartan twice a day, possibly even extra as blood pressure was elevated on last clinic visit.  She was going to try extra half valsartan to get the blood pressure down so I am not sure how much she is taking Would recommend she take valsartan 80 mg at night (we will stop the morning dose) In the morning would take different medication, doxazosin 1 mg daily Extra 1 mg doxazosin for systolic pressure over Q000111Q We can write the prescription doxazosin 1 mg daily with extra as needed Thx TGollan

## 2023-03-16 ENCOUNTER — Telehealth: Payer: Self-pay | Admitting: Family Medicine

## 2023-03-16 ENCOUNTER — Ambulatory Visit: Payer: Self-pay | Admitting: *Deleted

## 2023-03-16 NOTE — Telephone Encounter (Signed)
Pt reports that she partials and she is experiencing a breakout around her mouth.  Medication Refill - Medication: magic mouthwash SOLN   Has the patient contacted their pharmacy? No. No refills on Rx  Preferred Pharmacy (with phone number or street name):  [id number] [place], [place] - [address] AT [facility] [address] [facility] Phone: [phone]  Fax: [phone]     Has the patient been seen for an appointment in the last year OR does the patient have an upcoming appointment? No.  Agent: Please be advised that RX refills may take up to 3 business days. We ask that you follow-up with your pharmacy.

## 2023-03-16 NOTE — Telephone Encounter (Signed)
Summary: Pt reports that she partials and she is experiencing a breakout around her mouth.   Pt reports that she partials and she is experiencing a breakout around her mouth. Pt requests Rx for magic mouth wash       Chief Complaint: Mouth ulcers Symptoms: Pt states when she wears her partials "My mouth will break out in tiny bumps." States does not wear partials often "Two days a week."  Areas are on tongue and roof of mouth "About 10." Irritated when eating, otherwise not painful. Frequency: 2-3 weeks Pertinent Negatives: Patient denies fever, bleeding Disposition: [] ED /[] Urgent Care (no appt availability in office) / [] Appointment(In office/virtual)/ []  [facility]/ [] Home Care/ [] Refused Recommended Disposition /[] [facility]/ [x]  Follow-up with PCP Additional Notes: Pt states Dr. Sherrie Mustache has ordered Magic Mouthwash for her before, "And it works very well." Requesting med be sent in. Advised may need appt. Care advise provided, verbalizes understanding.   Reason for Disposition  4 or more ulcers  Answer Assessment - Initial Assessment Questions 1. LOCATION: "Where is the ulcer located?"      Tongue and roof of mouth 2. NUMBER: "How many ulcers are there?"      10 3. SIZE: "How large is the ulcer?"      Tiny 4. SEVERITY: "Are they painful?" If Yes, ask: "How bad is it?"  (Scale 1-10; or mild, moderate, severe)  - MILD - eating  and drinking normally   - MODERATE - decreased liquid intake   - SEVERE - drinking very little      Mild when eating 5. ONSET: "When did you first notice the ulcer?"      2-3 weeks 6. RECURRENT SYMPTOM: "Have you had a mouth ulcer before?" If Yes, ask: "When was the last time?" and "What happened that time?"      Yes 7. CAUSE: "What do you think is causing the mouth ulcer?"     Partials 8. OTHER SYMPTOMS: "Do you have any other symptoms?" (e.g., fever)     no  Protocols used: Mouth Ulcers-A-AH

## 2023-03-16 NOTE — Telephone Encounter (Signed)
See Nurse Triage encounter

## 2023-03-17 ENCOUNTER — Ambulatory Visit (INDEPENDENT_AMBULATORY_CARE_PROVIDER_SITE_OTHER): Payer: Medicare Other | Admitting: Physician Assistant

## 2023-03-17 ENCOUNTER — Encounter: Payer: Self-pay | Admitting: Physician Assistant

## 2023-03-17 VITALS — BP 168/70 | HR 86 | Wt 299.3 lb

## 2023-03-17 DIAGNOSIS — K219 Gastro-esophageal reflux disease without esophagitis: Secondary | ICD-10-CM | POA: Diagnosis not present

## 2023-03-17 MED ORDER — SUCRALFATE 1 G PO TABS: 1.0000 g | ORAL_TABLET | Freq: Three times a day (TID) | ORAL | 0 refills | Status: DC

## 2023-03-17 NOTE — Progress Notes (Signed)
I,Sha'taria Tyson,acting as a Neurosurgeon for [employer], PA-C.,have documented all relevant documentation on the behalf of Alfredia Ferguson, PA-C,as directed by  Alfredia Ferguson, PA-C while in the presence of Alfredia Ferguson, PA-C.   Established patient visit   Patient: Kaylee Reyes   DOB: Apr 23, 1951   72 y.o. Female  MRN: [id number] Visit Date: 03/17/2023  Today's healthcare provider: Alfredia Ferguson, PA-C   Cc. Burning in throat, chest  Subjective    HPI   Pt reports worsening burning in her chest, burning in her throat/mouth. She noticed small bumps in her mouth, a cracked tongue x 2-3 weeks. Denies fever, change in BM. Took tums otc, felt improvement after a few hours.   Medications: Outpatient Medications Prior to Visit  Medication Sig   Carboxymeth-Glycerin-Polysorb (REFRESH OPTIVE MEGA-3 OP) Place 1 drop into both eyes daily.   docusate sodium (COLACE) 100 MG capsule Take 200 mg by mouth daily. 2 in am, 1 at night   fexofenadine (ALLEGRA) 60 MG tablet Take 60 mg by mouth daily.   fluticasone (FLONASE) 50 MCG/ACT nasal spray Place 1 spray into both nostrils daily.   lansoprazole (PREVACID) 30 MG capsule TAKE 1 CAPSULE DAILY AT 12 NOON (NEED OFFICE VISIT PRIOR TO FURTHER REFILLS)   levothyroxine (SYNTHROID) 75 MCG tablet TAKE 1 TABLET DAILY   meclizine (ANTIVERT) 12.5 MG tablet TAKE 1 TABLET(12.5 MG) BY MOUTH THREE TIMES DAILY AS NEEDED FOR DIZZINESS   triamcinolone (KENALOG) 0.025 % cream Apply 1 Application topically 2 (two) times daily. As needed   valsartan (DIOVAN) 80 MG tablet Take 1 tablet (80 mg total) by mouth 2 (two) times daily.   Vitamin D, Ergocalciferol, (DRISDOL) 1.25 MG (50000 UNIT) CAPS capsule TAKE 1 CAPSULE BY MOUTH 1 TIME A WEEK   [DISCONTINUED] ezetimibe (ZETIA) 10 MG tablet Take 1 tablet (10 mg total) by mouth daily. (Patient not taking: Reported on 03/17/2023)   [DISCONTINUED] ondansetron (ZOFRAN) 4 MG tablet Take 1 tablet (4 mg total) by mouth every 8  (eight) hours as needed for nausea or vomiting.   No facility-administered medications prior to visit.    Review of Systems  Constitutional:  Negative for fatigue and fever.  HENT:  Positive for sore throat.   Respiratory:  Negative for cough and shortness of breath.   Cardiovascular:  Positive for chest pain. Negative for leg swelling.  Gastrointestinal:  Negative for abdominal pain.  Neurological:  Negative for dizziness and headaches.      Objective    BP (!) 168/70 (BP Location: Left Arm, Patient Position: Sitting, Cuff Size: Large)   Pulse 86   Wt 299 lb 4.8 oz (135.8 kg)   SpO2 98%   BMI 48.31 kg/m    Physical Exam Vitals reviewed.  Constitutional:      Appearance: She is not ill-appearing.  HENT:     Head: Normocephalic.     Mouth/Throat:     Mouth: Mucous membranes are dry.     Comments: Tongue with deep fissures appears dry Eyes:     Conjunctiva/sclera: Conjunctivae normal.  Cardiovascular:     Rate and Rhythm: Normal rate.  Pulmonary:     Effort: Pulmonary effort is normal. No respiratory distress.  Neurological:     General: No focal deficit present.     Mental Status: She is alert and oriented to person, place, and time.  Psychiatric:        Mood and Affect: Mood normal.        Behavior:  Behavior normal.     No results found for any visits on 03/17/23.  Assessment & Plan     GERD Rx carafate in addition to prevacid. Up to QID Pt has hx of hyplori-- advised breath test may be inaccurate d/t ppi use. Recommending ref to GI  Return if symptoms worsen or fail to improve.     I, Alfredia FergusonLindsay Geanine Vandekamp, PA-C have reviewed all documentation for this visit. The documentation on  03/17/23  for the exam, diagnosis, procedures, and orders are all accurate and complete.  Alfredia FergusonLindsay Arnett Duddy, PA-C [facility] [address] [place], [place], [address] Office: [phone] Fax: [phone]   [facility]

## 2023-03-24 ENCOUNTER — Other Ambulatory Visit: Payer: Self-pay | Admitting: Physician Assistant

## 2023-03-24 DIAGNOSIS — K219 Gastro-esophageal reflux disease without esophagitis: Secondary | ICD-10-CM

## 2023-03-28 DIAGNOSIS — K449 Diaphragmatic hernia without obstruction or gangrene: Secondary | ICD-10-CM | POA: Diagnosis not present

## 2023-03-28 DIAGNOSIS — K219 Gastro-esophageal reflux disease without esophagitis: Secondary | ICD-10-CM | POA: Diagnosis not present

## 2023-03-28 DIAGNOSIS — Z6841 Body Mass Index (BMI) 40.0 and over, adult: Secondary | ICD-10-CM | POA: Diagnosis not present

## 2023-03-29 ENCOUNTER — Other Ambulatory Visit: Payer: Self-pay | Admitting: *Deleted

## 2023-03-29 MED ORDER — VITAMIN D (ERGOCALCIFEROL) 1.25 MG (50000 UNIT) PO CAPS: 50000.0000 [IU] | ORAL_CAPSULE | ORAL | 1 refills | Status: DC

## 2023-04-02 ENCOUNTER — Other Ambulatory Visit: Payer: Self-pay | Admitting: Family Medicine

## 2023-04-02 DIAGNOSIS — K219 Gastro-esophageal reflux disease without esophagitis: Secondary | ICD-10-CM

## 2023-04-04 DIAGNOSIS — B372 Candidiasis of skin and nail: Secondary | ICD-10-CM | POA: Diagnosis not present

## 2023-04-04 NOTE — Telephone Encounter (Signed)
Requested medication (s) are due for refill today: routing for review  Requested medication (s) are on the active medication list: yes  Last refill:  03/24/23  Future visit scheduled: yes  Notes to clinic:  Unable to refill per protocol, Rx request was refused recently, but due for a refill. Short supply was given 03/24/23, routing for approval.      Requested Prescriptions  Pending Prescriptions Disp Refills   sucralfate (CARAFATE) 1 g tablet [Pharmacy Med Name: SUCRALFATE 1GM TABLETS] 40 tablet 0    Sig: TAKE 1 TABLET(1 GRAM) BY MOUTH FOUR TIMES DAILY AT BEDTIME WITH MEALS FOR 10 DAYS     Gastroenterology: Antiacids Passed - 04/02/2023  7:58 AM      Passed - Valid encounter within last 12 months    Recent Outpatient Visits           2 weeks ago Gastroesophageal reflux disease, unspecified whether esophagitis present   [facility] Alfredia Ferguson, PA-C   9 months ago Essential hypertension   [facility] Zebulon, Golden, PA-C   9 months ago Other acute pancreatitis without infection or necrosis   [facility] Edwards, Fraser, PA-C   9 months ago Other acute pancreatitis without infection or necrosis   [facility] Alfredia Ferguson, PA-C   1 year ago Primary hypertension   [facility] Sherrie Mustache, Demetrios Isaacs, MD

## 2023-04-18 ENCOUNTER — Other Ambulatory Visit: Payer: Self-pay | Admitting: Family Medicine

## 2023-04-18 DIAGNOSIS — H2511 Age-related nuclear cataract, right eye: Secondary | ICD-10-CM | POA: Diagnosis not present

## 2023-04-19 DIAGNOSIS — H2512 Age-related nuclear cataract, left eye: Secondary | ICD-10-CM | POA: Diagnosis not present

## 2023-05-09 DIAGNOSIS — H2512 Age-related nuclear cataract, left eye: Secondary | ICD-10-CM | POA: Diagnosis not present

## 2023-07-18 DIAGNOSIS — L821 Other seborrheic keratosis: Secondary | ICD-10-CM | POA: Diagnosis not present

## 2023-07-18 DIAGNOSIS — D1801 Hemangioma of skin and subcutaneous tissue: Secondary | ICD-10-CM | POA: Diagnosis not present

## 2023-07-18 DIAGNOSIS — L918 Other hypertrophic disorders of the skin: Secondary | ICD-10-CM | POA: Diagnosis not present

## 2023-07-18 DIAGNOSIS — R208 Other disturbances of skin sensation: Secondary | ICD-10-CM | POA: Diagnosis not present

## 2023-07-18 DIAGNOSIS — L820 Inflamed seborrheic keratosis: Secondary | ICD-10-CM | POA: Diagnosis not present

## 2023-08-12 ENCOUNTER — Other Ambulatory Visit: Payer: Self-pay | Admitting: Family Medicine

## 2023-09-19 ENCOUNTER — Ambulatory Visit (INDEPENDENT_AMBULATORY_CARE_PROVIDER_SITE_OTHER): Payer: Medicare Other

## 2023-09-19 VITALS — Ht 66.0 in | Wt 276.1 lb

## 2023-09-19 DIAGNOSIS — Z0000 Encounter for general adult medical examination without abnormal findings: Secondary | ICD-10-CM | POA: Diagnosis not present

## 2023-09-19 NOTE — Progress Notes (Signed)
Subjective:   Kaylee Reyes is a 72 y.o. female who presents for Medicare Annual (Subsequent) preventive examination.  Visit Complete: Virtual I connected with  MAYSON STERBENZ on 09/19/23 by a audio enabled telemedicine application and verified that I am speaking with the correct person using two identifiers.  Patient Location: Home  Provider Location: Office/Clinic  I discussed the limitations of evaluation and management by telemedicine. The patient expressed understanding and agreed to proceed.  Vital Signs: Because this visit was a virtual/telehealth visit, some criteria may be missing or patient reported. Any vitals not documented were not able to be obtained and vitals that have been documented are patient reported.  Patient Medicare AWV questionnaire was completed by the patient on 09/17/23; I have confirmed that all information answered by patient is correct and no changes since this date. Cardiac Risk Factors include: advanced age (>77men, >10 women);hypertension;sedentary lifestyle;obesity (BMI >30kg/m2);dyslipidemia    Objective:    Today's Vitals   09/19/23 0820  Weight: 276 lb 1.6 oz (125.2 kg)  Height: 5\' 6"  (1.676 m)   Body mass index is 44.56 kg/m.     09/19/2023    8:32 AM 11/13/2022   12:20 PM 10/05/2022    3:49 PM 09/16/2022    8:17 AM 07/10/2022   10:45 AM 06/25/2022    7:27 PM 06/25/2022   10:35 AM  Advanced Directives  Does Patient Have a Medical Advance Directive? Yes Yes No No No Yes Yes  Type of Estate agent of [place];Living will Healthcare Power of Attorney    Living will   Does patient want to make changes to medical advance directive?      No - Patient declined   Copy of Healthcare Power of Attorney in Chart? No - copy requested        Would patient like information on creating a medical advance directive?   No - Patient declined No - Patient declined  No - Patient declined     Current Medications  (verified) Outpatient Encounter Medications as of 09/19/2023  Medication Sig   Carboxymeth-Glycerin-Polysorb (REFRESH OPTIVE MEGA-3 OP) Place 1 drop into both eyes daily.   docusate sodium (COLACE) 100 MG capsule Take 200 mg by mouth daily. 2 in am, 1 at night   fexofenadine (ALLEGRA) 60 MG tablet Take 60 mg by mouth daily.   fluticasone (FLONASE) 50 MCG/ACT nasal spray Place 1 spray into both nostrils daily.   levothyroxine (SYNTHROID) 75 MCG tablet TAKE 1 TABLET DAILY   meclizine (ANTIVERT) 12.5 MG tablet TAKE 1 TABLET(12.5 MG) BY MOUTH THREE TIMES DAILY AS NEEDED FOR DIZZINESS   pantoprazole (PROTONIX) 40 MG tablet TAKE 1 TABLET(40 MG) BY MOUTH DAILY 15 TO 20 MINUTES BEFORE A MEAL   triamcinolone (KENALOG) 0.025 % cream Apply 1 Application topically 2 (two) times daily. As needed   valsartan (DIOVAN) 80 MG tablet Take 1 tablet (80 mg total) by mouth 2 (two) times daily.   Vitamin D, Ergocalciferol, (DRISDOL) 1.25 MG (50000 UNIT) CAPS capsule Take 1 capsule (50,000 Units total) by mouth every 7 (seven) days.   lansoprazole (PREVACID) 30 MG capsule TAKE 1 CAPSULE DAILY AT 12 NOON (NEED OFFICE VISIT PRIOR TO FURTHER REFILLS) (Patient not taking: Reported on 09/19/2023)   sucralfate (CARAFATE) 1 g tablet TAKE 1 TABLET(1 GRAM) BY MOUTH FOUR TIMES DAILY WITH MEALS AND AT [facility] FOR 10 DAYS (Patient not taking: Reported on 09/19/2023)   No facility-administered encounter medications on file as of 09/19/2023.  Allergies (verified) Penicillins, Nickel, Cardura [doxazosin], Keflex [cephalexin], and Pravastatin sodium   History: Past Medical History:  Diagnosis Date   Allergy    Notes in my chart   Anemia    Arthritis 2016   Noted from Mri and xrays   Body tinea 10/01/2015   Previously treated by Dr. Orson Aloe    Cancer [facility]) 09/2015   Had full mascetomy   COVID-19 02/23/2021   MAB infusion 01/23/2021   GERD (gastroesophageal reflux disease)    History of breast cancer 09/2015   breast  cancer   History of chicken pox    History of peptic ulcer disease 09/25/2015   Hyperlipidemia    Hypertension    Hypothyroidism    Neuropathy of foot    bilateral   Pinched nerve    PONV (postoperative nausea and vomiting)    exploratory surgery at [facility]   Thyroid disease    Tuberculosis exposure    Mother had TB.  Pt shows (+) on tests, but has never had.   Vertigo    last episode over 1 yr ago   Wears dentures    partial upper and lower   Past Surgical History:  Procedure Laterality Date   ABDOMINAL HYSTERECTOMY  07/13/2013   Total. with BSO for postmenopausal bleeding and fibroids with large cervical polyp. Dr. Dorian Furnace and Dr. Jean [facility]   ABDOMINAL ULTRASOUND  04/17/2011   Hepatomegaly with borderline slenomegaly. Suggestive of fatty liver. s/p cholecystectomy. Portions of aorta obscured   BREAST SURGERY Right 2011   BREAST BIOPSY   CESAREAN SECTION  1988   CHOLECYSTECTOMY  1990   COLONOSCOPY WITH PROPOFOL N/A 09/01/2017   Procedure: COLONOSCOPY WITH PROPOFOL;  Surgeon: Midge Minium, MD;  Location: [facility];  Service: Gastroenterology;  Laterality: N/A;   DIAGNOSTIC LAPAROSCOPY     ESOPHAGOGASTRODUODENOSCOPY (EGD) WITH PROPOFOL N/A 01/17/2020   Procedure: ESOPHAGOGASTRODUODENOSCOPY (EGD) WITH PROPOFOL;  Surgeon: Midge Minium, MD;  Location: [facility];  Service: Endoscopy;  Laterality: N/A;   EXPLORATORY LAPAROTOMY     Left breast   EYE SURGERY  2024   Cataract removed from both eyes   MASTECTOMY     MASTECTOMY W/ SENTINEL NODE BIOPSY Bilateral 10/28/2015   Procedure: MASTECTOMY WITH SENTINEL LYMPH NODE BIOPSY;  Surgeon: Gladis Riffle, MD;  Location: [facility];  Service: General;  Laterality: Bilateral;   PORT-A-CATH REMOVAL Left 10/11/2016   Procedure: REMOVAL PORT-A-CATH;  Surgeon: Gladis Riffle, MD;  Location: [facility];  Service: General;  Laterality: Left;   PORTACATH PLACEMENT Left 12/23/2015   Procedure: INSERTION PORT-A-CATH;   Surgeon: Gladis Riffle, MD;  Location: [facility];  Service: General;  Laterality: Left;   TUBAL LIGATION  1988   After C-section   WRIST SURGERY     Family History  Problem Relation Age of Onset   Hypertension Mother    Thyroid disease Mother    Hypertension Brother    Diabetes Brother    Breast cancer Neg Hx    Ovarian cancer Neg Hx    Colon cancer Neg Hx    Social History   Socioeconomic History   Marital status: Married    Spouse name: Not on file   Number of children: 2   Years of education: Not on file   Highest education level: Some college, no degree  Occupational History   Occupation: retired  Tobacco Use   Smoking status: Never   Smokeless tobacco: Never  Vaping Use   Vaping status: Never  Used  Substance and Sexual Activity   Alcohol use: Never   Drug use: Never   Sexual activity: Not Currently    Birth control/protection: Post-menopausal  Other Topics Concern   Not on file  Social History Narrative   Not on file   Social Determinants of Health   Financial Resource Strain: Low Risk  (09/17/2023)   Overall Financial Resource Strain (CARDIA)    Difficulty of Paying Living Expenses: Not hard at all  Food Insecurity: No Food Insecurity (09/17/2023)   Hunger Vital Sign    Worried About Running Out of Food in the Last Year: Never true    Ran Out of Food in the Last Year: Never true  Transportation Needs: No Transportation Needs (09/17/2023)   PRAPARE - Administrator, Civil Service (Medical): No    Lack of Transportation (Non-Medical): No  Physical Activity: Inactive (09/17/2023)   Exercise Vital Sign    Days of Exercise per Week: 0 days    Minutes of Exercise per Session: 0 min  Stress: No Stress Concern Present (09/17/2023)   [employer] of Occupational Health - Occupational Stress Questionnaire    Feeling of Stress : Not at all  Social Connections: Socially Integrated (09/17/2023)   Social Connection and Isolation Panel [NHANES]     Frequency of Communication with Friends and Family: More than three times a week    Frequency of Social Gatherings with Friends and Family: Three times a week    Attends Religious Services: More than 4 times per year    Active Member of Clubs or Organizations: Yes    Attends Engineer, structural: More than 4 times per year    Marital Status: Married    Tobacco Counseling Counseling given: Not Answered   Clinical Intake:  Pre-visit preparation completed: No  Pain : No/denies pain     BMI - recorded: 44.56 Nutritional Status: BMI > 30  Obese Nutritional Risks: Nausea/ vomitting/ diarrhea (some intermittent nausea and stomach issues for a couple of weeks) Diabetes: No  How often do you need to have someone help you when you read instructions, pamphlets, or other written materials from your doctor or pharmacy?: 1 - Never  Interpreter Needed?: No  Comments: lives with husband Information entered by :: B.Gurnoor Sloop,LPN   Activities of Daily Living    09/17/2023    6:00 PM  In your present state of health, do you have any difficulty performing the following activities:  Hearing? 0  Vision? 0  Difficulty concentrating or making decisions? 0  Walking or climbing stairs? 1  Dressing or bathing? 0  Doing errands, shopping? 0  Preparing Food and eating ? N  Using the Toilet? N  In the past six months, have you accidently leaked urine? Y  Do you have problems with loss of bowel control? N  Managing your Medications? N  Managing your Finances? N  Housekeeping or managing your Housekeeping? N    Patient Care Team: Malva Limes, MD as PCP - General (Family Medicine) Mariah Milling Tollie Pizza, MD as PCP - Cardiology (Cardiology) Midge Minium, MD as Consulting Physician (Gastroenterology) Jerrel Ivory, DO as Consulting Physician (Sports Medicine) Merri Ray, MD as Referring Physician (Physical Medicine and Rehabilitation) Henrene Dodge, MD as Consulting  Physician (General Surgery) Jeralyn Ruths, MD as Consulting Physician (Oncology) Isla Pence, OD (Optometry) Hildred Laser, MD as Referring Physician (Obstetrics and Gynecology)  Indicate any recent Medical Services you may have received from other than [facility]  providers in the past year (date may be approximate).     Assessment:   This is a routine wellness examination for Kaylee Reyes.  Hearing/Vision screen Hearing Screening - Comments:: Pt says her hearing is good Vision Screening - Comments:: Pt says her vision is good after cataract surgery w/glasses Dr Clydene Pugh   Goals Addressed             This Visit's Progress    COMPLETED: DIET - EAT MORE FRUITS AND VEGETABLES   On track    DIET - INCREASE WATER INTAKE   Not on track    Recommend to drink at least 6-8 8oz glasses of water per day.     Prevent falls   On track    Recommend to remove any items from the home that may cause slips or trips.       Depression Screen    09/19/2023    8:30 AM 09/16/2022    8:16 AM 09/15/2021    9:19 AM 06/15/2021    2:56 PM 01/15/2021   11:23 AM 07/15/2020   11:38 AM 07/25/2019   11:03 AM  PHQ 2/9 Scores  PHQ - 2 Score 0 0 0 0 0 0 0  PHQ- 9 Score  0 0 0 1 0     Fall Risk    09/17/2023    6:00 PM 09/16/2022    8:17 AM 09/15/2021    9:19 AM 06/15/2021    2:56 PM 01/15/2021   11:22 AM  Fall Risk   Falls in the past year? 0 0 0 0 0  Number falls in past yr:  0 0 0 0  Injury with Fall?  0 0 0 0  Risk for fall due to : No Fall Risks No Fall Risks No Fall Risks No Fall Risks No Fall Risks  Follow up Education provided;Falls prevention discussed Falls prevention discussed;Falls evaluation completed  Falls evaluation completed Follow up appointment    MEDICARE RISK AT HOME: Medicare Risk at Home Any stairs in or around the home?: Yes If so, are there any without handrails?: No Home free of loose throw rugs in walkways, pet beds, electrical cords, etc?: Yes Adequate lighting in  your home to reduce risk of falls?: Yes Life alert?: No Use of a cane, walker or w/c?: No Grab bars in the bathroom?: No Shower chair or bench in shower?: No Elevated toilet seat or a handicapped toilet?: Yes  TIMED UP AND GO:  Was the test performed?  No    Cognitive Function:        09/19/2023    8:35 AM 09/16/2022    8:19 AM  6CIT Screen  What Year? 0 points 0 points  What month? 0 points 0 points  What time? 0 points 0 points  Count back from 20 0 points 0 points  Months in reverse 0 points 0 points  Repeat phrase 0 points 0 points  Total Score 0 points 0 points    Immunizations Immunization History  Administered Date(s) Administered   Influenza Split 09/11/2010, 08/25/2011, 08/28/2013   Tdap 08/25/2011    TDAP status: Up to date  Flu Vaccine status: Declined, Education has been provided regarding the importance of this vaccine but patient still declined. Advised may receive this vaccine at local pharmacy or Health Dept. Aware to provide a copy of the vaccination record if obtained from local pharmacy or Health Dept. Verbalized acceptance and understanding.  Pneumococcal vaccine status: Declined,  Education has been provided regarding the  importance of this vaccine but patient still declined. Advised may receive this vaccine at local pharmacy or Health Dept. Aware to provide a copy of the vaccination record if obtained from local pharmacy or Health Dept. Verbalized acceptance and understanding.   Covid-19 vaccine status: Declined, Education has been provided regarding the importance of this vaccine but patient still declined. Advised may receive this vaccine at local pharmacy or Health Dept.or vaccine clinic. Aware to provide a copy of the vaccination record if obtained from local pharmacy or Health Dept. Verbalized acceptance and understanding.  Qualifies for Shingles Vaccine? Yes   Zostavax completed No   Shingrix Completed?: No.    Education has been provided  regarding the importance of this vaccine. Patient has been advised to call insurance company to determine out of pocket expense if they have not yet received this vaccine. Advised may also receive vaccine at local pharmacy or Health Dept. Verbalized acceptance and understanding.  Screening Tests Health Maintenance  Topic Date Due   COVID-19 Vaccine (1) 10/05/2023 (Originally 05/25/1956)   Zoster Vaccines- Shingrix (1 of 2) 12/20/2023 (Originally 05/26/1970)   INFLUENZA VACCINE  03/05/2024 (Originally 07/07/2023)   DTaP/Tdap/Td (2 - Td or Tdap) 09/18/2024 (Originally 08/24/2021)   Pneumonia Vaccine 1+ Years old (1 of 1 - PCV) 09/18/2024 (Originally 05/25/2017)   Medicare Annual Wellness (AWV)  09/18/2024   Colonoscopy  09/02/2027   DEXA SCAN  Completed   Hepatitis C Screening  Completed   HPV VACCINES  Aged Out   MAMMOGRAM  Discontinued    Health Maintenance  There are no preventive care reminders to display for this patient.   Colorectal cancer screening: Type of screening: Colonoscopy. Completed 09/01/2017. Repeat every 5-10 years  Mammogram status: No longer required due to discontinued.  Bone Density status: Completed 07/07/2020. Results reflect: Bone density results: NORMAL. Repeat every 5 years.  Lung Cancer Screening: (Low Dose CT Chest recommended if Age 62-80 years, 20 pack-year currently smoking OR have quit w/in 15years.) does not qualify.   Lung Cancer Screening Referral: no  Additional Screening:  Hepatitis C Screening: does not qualify; Completed 07/06/2016  Vision Screening: Recommended annual ophthalmology exams for early detection of glaucoma and other disorders of the eye. Is the patient up to date with their annual eye exam?  Yes  01/25/23 Who is the provider or what is the name of the office in which the patient attends annual eye exams? Dr Clydene Pugh If pt is not established with a provider, would they like to be referred to a provider to establish care? No .   Dental  Screening: Recommended annual dental exams for proper oral hygiene  Diabetic Foot Exam: n/a  Community Resource Referral / Chronic Care Management: CRR required this visit?  No   CCM required this visit?  No     Plan:     I have personally reviewed and noted the following in the patient's chart:   Medical and social history Use of alcohol, tobacco or illicit drugs  Current medications and supplements including opioid prescriptions. Patient is not currently taking opioid prescriptions. Functional ability and status Nutritional status Physical activity Advanced directives List of other physicians Hospitalizations, surgeries, and ER visits in previous 12 months Vitals Screenings to include cognitive, depression, and falls Referrals and appointments  In addition, I have reviewed and discussed with patient certain preventive protocols, quality metrics, and best practice recommendations. A written personalized care plan for preventive services as well as general preventive health recommendations were provided to patient.  Sue Lush, LPN   16/09/9603   After Visit Summary: (MyChart) Due to this being a telephonic visit, the after visit summary with patients personalized plan was offered to patient via MyChart   Nurse Notes: The patient states she is doing well and has no concerns or questions at this time.

## 2023-09-19 NOTE — Patient Instructions (Signed)
Kaylee Reyes , Thank you for taking time to come for your Medicare Wellness Visit. I appreciate your ongoing commitment to your health goals. Please review the following plan we discussed and let me know if I can assist you in the future.   Referrals/Orders/Follow-Ups/Clinician Recommendations: none  This is a list of the screening recommended for you and due dates:  Health Maintenance  Topic Date Due   COVID-19 Vaccine (1) 10/05/2023*   Zoster (Shingles) Vaccine (1 of 2) 12/20/2023*   Flu Shot  03/05/2024*   DTaP/Tdap/Td vaccine (2 - Td or Tdap) 09/18/2024*   Pneumonia Vaccine (1 of 1 - PCV) 09/18/2024*   Medicare Annual Wellness Visit  09/18/2024   Colon Cancer Screening  09/02/2027   DEXA scan (bone density measurement)  Completed   Hepatitis C Screening  Completed   HPV Vaccine  Aged Out   Mammogram  Discontinued  *Topic was postponed. The date shown is not the original due date.    Advanced directives: (Copy Requested) Please bring a copy of your health care power of attorney and living will to the office to be added to your chart at your convenience.  Next Medicare Annual Wellness Visit scheduled for next year: Yes 09/19/2024 @ 8:15am telephone

## 2023-09-27 DIAGNOSIS — K219 Gastro-esophageal reflux disease without esophagitis: Secondary | ICD-10-CM | POA: Diagnosis not present

## 2023-09-27 DIAGNOSIS — R143 Flatulence: Secondary | ICD-10-CM | POA: Diagnosis not present

## 2023-09-27 DIAGNOSIS — Z6841 Body Mass Index (BMI) 40.0 and over, adult: Secondary | ICD-10-CM | POA: Diagnosis not present

## 2023-09-27 DIAGNOSIS — K760 Fatty (change of) liver, not elsewhere classified: Secondary | ICD-10-CM | POA: Diagnosis not present

## 2023-09-27 DIAGNOSIS — K449 Diaphragmatic hernia without obstruction or gangrene: Secondary | ICD-10-CM | POA: Diagnosis not present

## 2023-09-27 DIAGNOSIS — R195 Other fecal abnormalities: Secondary | ICD-10-CM | POA: Diagnosis not present

## 2023-09-27 DIAGNOSIS — R1013 Epigastric pain: Secondary | ICD-10-CM | POA: Diagnosis not present

## 2023-10-02 ENCOUNTER — Other Ambulatory Visit: Payer: Self-pay | Admitting: Oncology

## 2023-10-10 ENCOUNTER — Encounter: Payer: Self-pay | Admitting: Family Medicine

## 2023-10-10 ENCOUNTER — Ambulatory Visit (INDEPENDENT_AMBULATORY_CARE_PROVIDER_SITE_OTHER): Payer: Medicare Other | Admitting: Family Medicine

## 2023-10-10 VITALS — BP 153/85 | HR 70 | Ht 66.0 in | Wt 285.0 lb

## 2023-10-10 DIAGNOSIS — R2232 Localized swelling, mass and lump, left upper limb: Secondary | ICD-10-CM | POA: Diagnosis not present

## 2023-10-10 NOTE — Progress Notes (Signed)
Established patient visit   Patient: Kaylee Reyes   DOB: October 03, 1951   72 y.o. Female  MRN: [id number] Visit Date: 10/10/2023  Today's healthcare provider: Mila Merry, MD   Chief Complaint  Patient presents with   Arm Pain    Pt stated--left arm pain, lump for 2 months.   Subjective    Discussed the use of AI scribe software for clinical note transcription with the patient, who gave verbal consent to proceed.  History of Present Illness   The patient presents with a several-month history of intermittent discomfort and a palpable knot in the lower part of her left upper arm. The discomfort has been more persistent recently, following a two-day bus trip involving frequent use of arm rails for support due to existing foot problems. The patient has been managing the discomfort with Aspercreme, which has provided temporary relief. She denies any specific injury to the arm and the discomfort and knot seemed to have appeared spontaneously. The patient acknowledges lifting objects such as groceries in her daily life, which she was advised against due to her medical history.       Medications: Outpatient Medications Prior to Visit  Medication Sig   Carboxymeth-Glycerin-Polysorb (REFRESH OPTIVE MEGA-3 OP) Place 1 drop into both eyes daily.   docusate sodium (COLACE) 100 MG capsule Take 200 mg by mouth daily. 2 in am, 1 at night   fexofenadine (ALLEGRA) 60 MG tablet Take 60 mg by mouth daily.   fluticasone (FLONASE) 50 MCG/ACT nasal spray Place 1 spray into both nostrils daily.   lansoprazole (PREVACID) 30 MG capsule TAKE 1 CAPSULE DAILY AT 12 NOON (NEED OFFICE VISIT PRIOR TO FURTHER REFILLS)   levothyroxine (SYNTHROID) 75 MCG tablet TAKE 1 TABLET DAILY   meclizine (ANTIVERT) 12.5 MG tablet TAKE 1 TABLET(12.5 MG) BY MOUTH THREE TIMES DAILY AS NEEDED FOR DIZZINESS   prednisoLONE acetate (PRED FORTE) 1 % ophthalmic suspension Place 1 drop into the left eye 4 (four) times daily.    sucralfate (CARAFATE) 1 g tablet TAKE 1 TABLET(1 GRAM) BY MOUTH FOUR TIMES DAILY WITH MEALS AND AT BEDIME FOR 10 DAYS   triamcinolone (KENALOG) 0.025 % cream Apply 1 Application topically 2 (two) times daily. As needed   valsartan (DIOVAN) 80 MG tablet Take 1 tablet (80 mg total) by mouth 2 (two) times daily.   Vitamin D, Ergocalciferol, (DRISDOL) 1.25 MG (50000 UNIT) CAPS capsule TAKE 1 CAPSULE BY MOUTH EVERY 7 DAYS   [DISCONTINUED] pantoprazole (PROTONIX) 40 MG tablet TAKE 1 TABLET(40 MG) BY MOUTH DAILY 15 TO 20 MINUTES BEFORE A MEAL   No facility-administered medications prior to visit.   Review of Systems  Constitutional:  Negative for appetite change, chills, fatigue and fever.  Respiratory:  Negative for chest tightness and shortness of breath.   Cardiovascular:  Negative for chest pain and palpitations.  Gastrointestinal:  Negative for abdominal pain, nausea and vomiting.  Neurological:  Negative for dizziness and weakness.       Objective    BP (!) 153/85   Pulse 70   Ht 5\' 6"  (1.676 m)   Wt 285 lb (129.3 kg)   SpO2 98%   BMI 46.00 kg/m    Physical Exam   SKIN: Palpable knot on arm with tenderness and firmness noted below the skin surface across the area.    No results found for any visits on 10/10/23.  Assessment & Plan        Arm Mass Firm, tender mass  in the arm present for several months, exacerbated by recent physical activity. No known injury.  -Order an ultrasound of the arm to characterize the mass. -Consider MRI if ultrasound is inconclusive.    No follow-ups on file.      Mila Merry, MD  [facility] [phone] (phone) [phone] (fax)  [facility]

## 2023-10-13 ENCOUNTER — Ambulatory Visit
Admission: RE | Admit: 2023-10-13 | Discharge: 2023-10-13 | Disposition: A | Discharge status: Home / Self Care | Payer: Medicare Other | Source: Ambulatory Visit | Attending: Family Medicine | Admitting: Family Medicine

## 2023-10-13 DIAGNOSIS — R2232 Localized swelling, mass and lump, left upper limb: Secondary | ICD-10-CM | POA: Diagnosis not present

## 2023-12-03 ENCOUNTER — Encounter: Payer: Self-pay | Admitting: Emergency Medicine

## 2023-12-03 ENCOUNTER — Ambulatory Visit
Admission: EM | Admit: 2023-12-03 | Discharge: 2023-12-03 | Disposition: A | Discharge status: Home / Self Care | Payer: Medicare Other | Attending: Family Medicine | Admitting: Family Medicine

## 2023-12-03 ENCOUNTER — Ambulatory Visit
Admission: RE | Admit: 2023-12-03 | Discharge: 2023-12-03 | Disposition: A | Discharge status: Home / Self Care | Payer: Medicare Other | Source: Ambulatory Visit | Attending: Family Medicine | Admitting: Family Medicine

## 2023-12-03 ENCOUNTER — Ambulatory Visit (INDEPENDENT_AMBULATORY_CARE_PROVIDER_SITE_OTHER): Payer: Medicare Other

## 2023-12-03 DIAGNOSIS — R0989 Other specified symptoms and signs involving the circulatory and respiratory systems: Secondary | ICD-10-CM | POA: Diagnosis not present

## 2023-12-03 DIAGNOSIS — J209 Acute bronchitis, unspecified: Secondary | ICD-10-CM

## 2023-12-03 DIAGNOSIS — I10 Essential (primary) hypertension: Secondary | ICD-10-CM | POA: Diagnosis not present

## 2023-12-03 DIAGNOSIS — R059 Cough, unspecified: Secondary | ICD-10-CM | POA: Diagnosis not present

## 2023-12-03 MED ORDER — AZITHROMYCIN 250 MG PO TABS: ORAL_TABLET | ORAL | 0 refills | Status: DC

## 2023-12-03 MED ORDER — PROMETHAZINE-DM 6.25-15 MG/5ML PO SYRP: 5.0000 mL | ORAL_SOLUTION | Freq: Four times a day (QID) | ORAL | 0 refills | Status: DC | PRN

## 2023-12-03 NOTE — ED Triage Notes (Signed)
Patient c/o cough and chest congestion for 2 weeks.  Patient denies fevers.  

## 2023-12-03 NOTE — Discharge Instructions (Addendum)
Your chest xray did not show evidence of pneumonia though the radiologist has not yet read it. If they find something that I didn't, I will call you.    Stop by the pharmacy to pick up your prescriptions.  Follow up with your primary care provider as needed.  Be sure to take your blood pressure medications daily.  Take your blood pressure medications when you get home.  Follow-up with your primary care doctor with a log of your blood pressures that you take at home as you may need to change them to your blood pressure medications.

## 2023-12-03 NOTE — ED Provider Notes (Addendum)
[facility]    CSN: [id number] Arrival date & time: 12/03/23  1158      History   Chief Complaint Chief Complaint  Patient presents with   Cough    Appointment    HPI Kaylee Reyes is a 72 y.o. female.   HPI  History obtained from the patient. Kaylee Reyes presents for dry cough and congestion for the past 2 weeks. Has chest tightness with coughing. Had scratchy throat, nasal congestion and rhinorrhea. Tried Coricidin BP, chloraseptic spray, Tessalon perles and Tylenol that helped and she started to feel better but not going away. Has ear pressure. Has nausea but no abdominal pain, diarrhea or vomiting. Her daughter and grandson had pneumonia.   She didn't take her blood pressure medications as she rushes out the door.  Typically takes her blood pressure meds daily.        Past Medical History:  Diagnosis Date   Allergy    Notes in my chart   Anemia    Arthritis 2016   Noted from Mri and xrays   Body tinea 10/01/2015   Previously treated by Dr. Orson Aloe    Cancer [facility]) 09/2015   Had full mascetomy   COVID-19 02/23/2021   MAB infusion 01/23/2021   GERD (gastroesophageal reflux disease)    History of breast cancer 09/2015   breast cancer   History of chicken pox    History of peptic ulcer disease 09/25/2015   Hyperlipidemia    Hypertension    Hypothyroidism    Neuropathy of foot    bilateral   Pinched nerve    PONV (postoperative nausea and vomiting)    exploratory surgery at [facility]   Thyroid disease    Tuberculosis exposure    Mother had TB.  Pt shows (+) on tests, but has never had.   Vertigo    last episode over 1 yr ago   Wears dentures    partial upper and lower    Patient Active Problem List   Diagnosis Date Noted   Subacute pancreatitis 06/26/2022   Abdominal pain 06/25/2022   Nausea 06/25/2022   Dehydration 06/25/2022   Elevated lipase 06/25/2022   Anemia 06/15/2022   Acute pancreatitis 06/05/2022   History of breast cancer  06/05/2022   S/P mastectomy 12/15/2021   Chemotherapy-induced neuropathy (HCC) 08/14/2021   Pedal edema 03/06/2021   SOB (shortness of breath) 03/06/2021   Pure hypercholesterolemia 03/06/2021   Obesity, Class III, BMI 40-49.9 (morbid obesity) (HCC) 08/06/2019   Menopause 03/23/2017   Rectocele 03/23/2017   Cystocele, midline 03/23/2017   Vaginal atrophy 03/23/2017   Left breast cancer with T3 tumor, >5 cm in greatest dimension (HCC)    S/P bilateral mastectomy 10/28/2015   Fatty infiltration of liver 10/01/2015   Gonalgia 10/01/2015   Invasive lobular carcinoma of right breast, stage 2 (HCC) 10/01/2015   Acquired hypothyroidism 09/25/2015   Obesity (BMI 30-39.9) 09/25/2015   Hypercholesterolemia 09/25/2015   Essential hypertension 09/25/2015   Allergic rhinitis 09/25/2015   GERD (gastroesophageal reflux disease) 09/25/2015   Eczema 09/25/2015    Past Surgical History:  Procedure Laterality Date   ABDOMINAL HYSTERECTOMY  07/13/2013   Total. with BSO for postmenopausal bleeding and fibroids with large cervical polyp. Dr. Dorian Furnace and Dr. Jean Rosenthal [facility]   ABDOMINAL ULTRASOUND  04/17/2011   Hepatomegaly with borderline slenomegaly. Suggestive of fatty liver. s/p cholecystectomy. Portions of aorta obscured   BREAST SURGERY Right 2011   BREAST BIOPSY   CESAREAN SECTION  1988  CHOLECYSTECTOMY  1990   COLONOSCOPY WITH PROPOFOL N/A 09/01/2017   Procedure: COLONOSCOPY WITH PROPOFOL;  Surgeon: Midge Minium, MD;  Location: [facility];  Service: Gastroenterology;  Laterality: N/A;   DIAGNOSTIC LAPAROSCOPY     ESOPHAGOGASTRODUODENOSCOPY (EGD) WITH PROPOFOL N/A 01/17/2020   Procedure: ESOPHAGOGASTRODUODENOSCOPY (EGD) WITH PROPOFOL;  Surgeon: Midge Minium, MD;  Location: [facility];  Service: Endoscopy;  Laterality: N/A;   EXPLORATORY LAPAROTOMY     Left breast   EYE SURGERY  2024   Cataract removed from both eyes   MASTECTOMY     MASTECTOMY W/ SENTINEL NODE BIOPSY  Bilateral 10/28/2015   Procedure: MASTECTOMY WITH SENTINEL LYMPH NODE BIOPSY;  Surgeon: Gladis Riffle, MD;  Location: [facility];  Service: General;  Laterality: Bilateral;   PORT-A-CATH REMOVAL Left 10/11/2016   Procedure: REMOVAL PORT-A-CATH;  Surgeon: Gladis Riffle, MD;  Location: [facility];  Service: General;  Laterality: Left;   PORTACATH PLACEMENT Left 12/23/2015   Procedure: INSERTION PORT-A-CATH;  Surgeon: Gladis Riffle, MD;  Location: [facility];  Service: General;  Laterality: Left;   TUBAL LIGATION  1988   After C-section   WRIST SURGERY      OB History     Gravida  3   Para  2   Term  2   Preterm      AB  1   Living  2      SAB  1   IAB      Ectopic      Multiple      Live Births  2            Home Medications    Prior to Admission medications   Medication Sig Start Date End Date Taking? Authorizing Provider  azithromycin (ZITHROMAX Z-PAK) 250 MG tablet Take 2 tablets on day 1 then 1 tablet daily 12/03/23  Yes Daila Elbert, DO  promethazine-dextromethorphan (PROMETHAZINE-DM) 6.25-15 MG/5ML syrup Take 5 mLs by mouth 4 (four) times daily as needed. 12/03/23  Yes Anup Brigham, Seward Meth, DO  Carboxymeth-Glycerin-Polysorb (REFRESH OPTIVE MEGA-3 OP) Place 1 drop into both eyes daily.    [provider]  docusate sodium (COLACE) 100 MG capsule Take 200 mg by mouth daily. 2 in am, 1 at night    [provider]  fexofenadine (ALLEGRA) 60 MG tablet Take 60 mg by mouth daily.    [provider]  fluticasone (FLONASE) 50 MCG/ACT nasal spray Place 1 spray into both nostrils daily. 10/22/22   Malva Limes, MD  lansoprazole (PREVACID) 30 MG capsule TAKE 1 CAPSULE DAILY AT 12 NOON (NEED OFFICE VISIT PRIOR TO FURTHER REFILLS) 09/08/22   Malva Limes, MD  levothyroxine (SYNTHROID) 75 MCG tablet TAKE 1 TABLET DAILY 04/18/23   Malva Limes, MD  meclizine (ANTIVERT) 12.5 MG tablet TAKE 1 TABLET(12.5 MG) BY MOUTH THREE TIMES  DAILY AS NEEDED FOR DIZZINESS 08/12/23   Malva Limes, MD  prednisoLONE acetate (PRED FORTE) 1 % ophthalmic suspension Place 1 drop into the left eye 4 (four) times daily. 04/19/23   [provider]  sucralfate (CARAFATE) 1 g tablet TAKE 1 TABLET(1 GRAM) BY MOUTH FOUR TIMES DAILY WITH MEALS AND AT [facility] FOR 10 DAYS 03/24/23   Malva Limes, MD  triamcinolone (KENALOG) 0.025 % cream Apply 1 Application topically 2 (two) times daily. As needed 05/17/22   [provider]  valsartan (DIOVAN) 80 MG tablet Take 1 tablet (80 mg total) by mouth 2 (two) times daily. 01/17/23  Antonieta Iba, MD  Vitamin D, Ergocalciferol, (DRISDOL) 1.25 MG (50000 UNIT) CAPS capsule TAKE 1 CAPSULE BY MOUTH EVERY 7 DAYS 10/03/23   Jeralyn Ruths, MD    Family History Family History  Problem Relation Age of Onset   Hypertension Mother    Thyroid disease Mother    Hypertension Brother    Diabetes Brother    Breast cancer Neg Hx    Ovarian cancer Neg Hx    Colon cancer Neg Hx     Social History Social History   Tobacco Use   Smoking status: Never   Smokeless tobacco: Never  Vaping Use   Vaping status: Never Used  Substance Use Topics   Alcohol use: Never   Drug use: Never     Allergies   Penicillins, Nickel, Cardura [doxazosin], Keflex [cephalexin], and Pravastatin sodium   Review of Systems Review of Systems: negative unless otherwise stated in HPI.      Physical Exam Triage Vital Signs ED Triage Vitals  Encounter Vitals Group     BP 12/03/23 1204 (!) 198/93     Systolic BP Percentile --      Diastolic BP Percentile --      Pulse Rate 12/03/23 1204 81     Resp 12/03/23 1204 15     Temp 12/03/23 1204 98.5 F (36.9 C)     Temp Source 12/03/23 1204 Oral     SpO2 12/03/23 1204 96 %     Weight 12/03/23 1203 285 lb 0.9 oz (129.3 kg)     Height 12/03/23 1203 5\' 6"  (1.676 m)     Head Circumference --      Peak Flow --      Pain Score 12/03/23 1203 0     Pain Loc --       Pain Education --      Exclude from Growth Chart --    No data found.  Updated Vital Signs BP (!) 170/78 (BP Location: Left Wrist)   Pulse 81   Temp 98.5 F (36.9 C) (Oral)   Resp 15   Ht 5\' 6"  (1.676 m)   Wt 129.3 kg   SpO2 96%   BMI 46.01 kg/m   Visual Acuity Right Eye Distance:   Left Eye Distance:   Bilateral Distance:    Right Eye Near:   Left Eye Near:    Bilateral Near:     Physical Exam GEN:     alert, non-toxic appearing female in no distress    HENT:  mucus membranes moist, oropharyngeal without lesions or erythema, no tonsillar hypertrophy or exudates, clear nasal discharge, bilateral TM normal EYES:   no scleral injection or discharge NECK:  good ROM, no meningismus   RESP:  no increased work of breathing, clear to auscultation bilaterally CVS:   regular rate and rhythm Skin:   warm and dry, no rash on visible skin    UC Treatments / Results  Labs (all labs ordered are listed, but only abnormal results are displayed) Labs Reviewed - No data to display  EKG   Radiology DG Chest 2 View Result Date: 12/03/2023 CLINICAL DATA:  Cough and chest congestion for 2 weeks. EXAM: CHEST - 2 VIEW COMPARISON:  10/05/2022 FINDINGS: The heart size and mediastinal contours are within normal limits. Both lungs are clear. The visualized skeletal structures are unremarkable. IMPRESSION: No active cardiopulmonary disease. Electronically Signed   By: Danae Orleans M.D.   On: 12/03/2023 13:15    Procedures Procedures (including  critical care time)  Medications Ordered in UC Medications - No data to display  Initial Impression / Assessment and Plan / UC Course  I have reviewed the triage vital signs and the nursing notes.  Pertinent labs & imaging results that were available during my care of the patient were reviewed by me and considered in my medical decision making (see chart for details).     Pt is a 72 y.o. female who presents for 2 weeks of cough that is  not improving with nasal congestion.  She was exposed to pneumonia by her daughter and grandson.  Raynette is afebrile here without recent antipyretics. Satting well on room air. Overall pt is  non-toxic appearing, well hydrated, without respiratory distress. Pulmonary exam is unremarkable except dry cough.  After shared decision making, we will pursue chest x-ray.  COVID, RSV  and influenza testing deferred due to length of symptoms.   Chest xray personally reviewed by me without focal pneumonia, pleural effusion, cardiomegaly or pneumothorax.  Patient aware the radiologist has not read her xray and is comfortable with the preliminary read by me. Will review radiologist read when available and call patient if a change in plan is warranted.  Pt agreeable to this plan prior to discharge.   Treat acute bronchitis with  antibiotics as below.  Promethazine DM cough syrup given for cough and allow patient to rest.  Typical duration of symptoms discussed. Return and ED precautions given and patient voiced understanding.   Patient is hypertensive here BP 198/93.  She did not take her blood pressure medications this morning though she does states that she takes them regularly.  Repeat blood pressure after patient sat for a while was 170/78.  Advised patient to keep a log of her blood pressures and follow-up with her primary care doctor.  She should take her blood pressure occasions when she get home.  Discussed MDM, treatment plan and plan for follow-up with patient who agrees with plan.    Radiologist impression reviewed.   Final Clinical Impressions(s) / UC Diagnoses   Final diagnoses:  Acute bronchitis, unspecified organism  Elevated blood pressure reading with diagnosis of hypertension     Discharge Instructions      Your chest xray did not show evidence of pneumonia though the radiologist has not yet read it. If they find something that I didn't, I will call you.    Stop by the pharmacy to pick  up your prescriptions.  Follow up with your primary care provider as needed.  Be sure to take your blood pressure medications daily.  Take your blood pressure medications when you get home.  Follow-up with your primary care doctor with a log of your blood pressures that you take at home as you may need to change them to your blood pressure medications.      ED Prescriptions     Medication Sig Dispense Auth. Provider   azithromycin (ZITHROMAX Z-PAK) 250 MG tablet Take 2 tablets on day 1 then 1 tablet daily 6 tablet Shavonda Wiedman, DO   promethazine-dextromethorphan (PROMETHAZINE-DM) 6.25-15 MG/5ML syrup Take 5 mLs by mouth 4 (four) times daily as needed. 118 mL Katha Cabal, DO      PDMP not reviewed this encounter.       Katha Cabal, DO 12/03/23 1345

## 2023-12-11 ENCOUNTER — Other Ambulatory Visit: Payer: Self-pay | Admitting: Family Medicine

## 2023-12-13 NOTE — Telephone Encounter (Signed)
 Requested medication (s) are due for refill today- expired Rx  Requested medication (s) are on the active medication list -yes  Future visit scheduled -no  Last refill: 10/22/22 11.69ml 3RF  Notes to clinic: expired Rx  Requested Prescriptions  Pending Prescriptions Disp Refills   fluticasone  (FLONASE ) 50 MCG/ACT nasal spray [Pharmacy Med Name: FLUTICASONE  NASAL SP (120) RX] 16 g     Sig: SHAKE LIQUID AND USE 1 SPRAY IN EACH NOSTRIL DAILY     Ear, Nose, and Throat: Nasal Preparations - Corticosteroids Passed - 12/13/2023  3:23 PM      Passed - Valid encounter within last 12 months    Recent Outpatient Visits           2 months ago Arm mass, left   [facility] Gasper Nancyann BRAVO, MD   9 months ago Gastroesophageal reflux disease, unspecified whether esophagitis present   [facility] Cyndi Shaver, PA-C   1 year ago Essential hypertension   [place] [facility] Cyndi Shaver, PA-C   1 year ago Other acute pancreatitis without infection or necrosis   [facility] Cyndi Shaver, PA-C   1 year ago Other acute pancreatitis without infection or necrosis   [facility] Cyndi Shaver, PA-C                 Requested Prescriptions  Pending Prescriptions Disp Refills   fluticasone  (FLONASE ) 50 MCG/ACT nasal spray [Pharmacy Med Name: FLUTICASONE  NASAL SP (120) RX] 16 g     Sig: SHAKE LIQUID AND USE 1 SPRAY IN EACH NOSTRIL DAILY     Ear, Nose, and Throat: Nasal Preparations - Corticosteroids Passed - 12/13/2023  3:23 PM      Passed - Valid encounter within last 12 months    Recent Outpatient Visits           2 months ago Arm mass, left   [facility] Gasper Nancyann BRAVO, MD   9 months ago Gastroesophageal reflux disease, unspecified whether esophagitis present   [facility] Cyndi Shaver,  PA-C   1 year ago Essential hypertension   [facility] Cyndi Shaver, PA-C   1 year ago Other acute pancreatitis without infection or necrosis   [facility] Cyndi Shaver, PA-C   1 year ago Other acute pancreatitis without infection or necrosis   [facility] Cyndi Shaver, PA-C

## 2023-12-15 ENCOUNTER — Ambulatory Visit (INDEPENDENT_AMBULATORY_CARE_PROVIDER_SITE_OTHER): Payer: Medicare Other | Admitting: Family Medicine

## 2023-12-15 ENCOUNTER — Encounter: Payer: Self-pay | Admitting: Family Medicine

## 2023-12-15 VITALS — BP 156/88 | HR 76 | Temp 98.3°F | Ht 66.0 in | Wt 287.0 lb

## 2023-12-15 DIAGNOSIS — I10 Essential (primary) hypertension: Secondary | ICD-10-CM | POA: Diagnosis not present

## 2023-12-15 DIAGNOSIS — N3090 Cystitis, unspecified without hematuria: Secondary | ICD-10-CM | POA: Insufficient documentation

## 2023-12-15 LAB — POCT URINALYSIS DIPSTICK
Bilirubin, UA: NEGATIVE
Glucose, UA: NEGATIVE
Ketones, UA: NEGATIVE
Nitrite, UA: NEGATIVE
Protein, UA: NEGATIVE
Spec Grav, UA: 1.015 (ref 1.010–1.025)
Urobilinogen, UA: NEGATIVE U/dL — AB
pH, UA: 7.5 (ref 5.0–8.0)

## 2023-12-15 MED ORDER — VALSARTAN 80 MG PO TABS: 80.0000 mg | ORAL_TABLET | Freq: Two times a day (BID) | ORAL | 0 refills | Status: DC

## 2023-12-15 MED ORDER — NITROFURANTOIN MONOHYD MACRO 100 MG PO CAPS: 100.0000 mg | ORAL_CAPSULE | Freq: Two times a day (BID) | ORAL | 0 refills | Status: AC

## 2023-12-15 NOTE — Progress Notes (Signed)
 Established patient visit   Patient: Kaylee Reyes   DOB: Apr 27, 1951   73 y.o. Female  MRN: [id number] Visit Date: 12/15/2023  Today's healthcare provider: Rockie Agent, MD   No chief complaint on file.  Subjective     HPI   Pt stated--after Zpack-start having burning sensation--1 week. Taking AZO but not helping Last edited by Deitra Therisa HERO, CMA on 12/15/2023 10:35 AM.       Discussed the use of AI scribe software for clinical note transcription with the patient, who gave verbal consent to proceed.  History of Present Illness   The patient, a 73 year old with a history of hypertension and urinary tract infections, presents with dysuria following a course of azithromycin  (Z-Pak) for acute bronchitis. The patient reports a burning sensation during urination, which began on the last day of the antibiotic course. Over-the-counter phenazopyridine  (Azo) was taken for two days without relief. The patient also self-administered a three-and-a-half-day course of trimethoprim -sulfamethoxazole  (Bactrim ), an antibiotic her spouse uses for urinary issues, without improvement. The patient has a history of E. coli UTI resistant to Bactrim , levofloxacin, ciprofloxacin , and ampicillin.  The patient also reports elevated blood pressure readings, which have been noted at recent visits to urgent care. The patient has a history of hypertension, previously managed with valsartan  80mg  twice daily, but has been taking only the morning dose for some time. The patient expresses a willingness to return to the twice-daily regimen.  The patient denies any abdominal pain or tenderness on examination. The patient also reports a trace amount of blood in the urine sample. The patient experiences transient chills at the end of urination but denies any fever.         Past Medical History:  Diagnosis Date   Allergy    Notes in my chart   Anemia    Arthritis 2016   Noted from Mri and xrays    Body tinea 10/01/2015   Previously treated by Dr. charlott    Cancer [facility]) 09/2015   Had full mascetomy   COVID-19 02/23/2021   MAB infusion 01/23/2021   GERD (gastroesophageal reflux disease)    History of breast cancer 09/2015   breast cancer   History of chicken pox    History of peptic ulcer disease 09/25/2015   Hyperlipidemia    Hypertension    Hypothyroidism    Neuropathy of foot    bilateral   Pinched nerve    PONV (postoperative nausea and vomiting)    exploratory surgery at [facility]   Thyroid  disease    Tuberculosis exposure    Mother had TB.  Pt shows (+) on tests, but has never had.   Vertigo    last episode over 1 yr ago   Wears dentures    partial upper and lower    Medications: Outpatient Medications Prior to Visit  Medication Sig   Carboxymeth-Glycerin-Polysorb (REFRESH OPTIVE MEGA-3 OP) Place 1 drop into both eyes daily.   docusate sodium  (COLACE) 100 MG capsule Take 200 mg by mouth daily. 2 in am, 1 at night   fexofenadine (ALLEGRA) 60 MG tablet Take 60 mg by mouth daily.   fluticasone  (FLONASE ) 50 MCG/ACT nasal spray SHAKE LIQUID AND USE 1 SPRAY IN EACH NOSTRIL DAILY   lansoprazole  (PREVACID ) 30 MG capsule TAKE 1 CAPSULE DAILY AT 12 NOON (NEED OFFICE VISIT PRIOR TO FURTHER REFILLS)   levothyroxine  (SYNTHROID ) 75 MCG tablet TAKE 1 TABLET DAILY   meclizine  (ANTIVERT ) 12.5 MG tablet TAKE 1  TABLET(12.5 MG) BY MOUTH THREE TIMES DAILY AS NEEDED FOR DIZZINESS   prednisoLONE acetate (PRED FORTE) 1 % ophthalmic suspension Place 1 drop into the left eye 4 (four) times daily.   promethazine -dextromethorphan (PROMETHAZINE -DM) 6.25-15 MG/5ML syrup Take 5 mLs by mouth 4 (four) times daily as needed.   triamcinolone  (KENALOG ) 0.025 % cream Apply 1 Application topically 2 (two) times daily. As needed   Vitamin D , Ergocalciferol , (DRISDOL ) 1.25 MG (50000 UNIT) CAPS capsule TAKE 1 CAPSULE BY MOUTH EVERY 7 DAYS   [DISCONTINUED] valsartan  (DIOVAN ) 80 MG tablet Take 1 tablet (80 mg  total) by mouth 2 (two) times daily.   sucralfate  (CARAFATE ) 1 g tablet TAKE 1 TABLET(1 GRAM) BY MOUTH FOUR TIMES DAILY WITH MEALS AND AT [facility] FOR 10 DAYS (Patient not taking: Reported on 12/15/2023)   [DISCONTINUED] azithromycin  (ZITHROMAX  Z-PAK) 250 MG tablet Take 2 tablets on day 1 then 1 tablet daily (Patient not taking: Reported on 12/15/2023)   No facility-administered medications prior to visit.    Review of Systems      Objective    BP (!) 156/88   Pulse 76   Temp 98.3 F (36.8 C)   Ht 5' 6 (1.676 m)   Wt 287 lb (130.2 kg)   SpO2 98%   BMI 46.32 kg/m  BP Readings from Last 3 Encounters:  12/15/23 (!) 156/88  12/03/23 (!) 170/78  10/10/23 (!) 153/85   Wt Readings from Last 3 Encounters:  12/15/23 287 lb (130.2 kg)  12/03/23 285 lb 0.9 oz (129.3 kg)  10/10/23 285 lb (129.3 kg)        Physical Exam Abdominal:     General: Abdomen is flat. Bowel sounds are normal. There is no distension. There are no signs of injury.     Palpations: Abdomen is soft. There is no shifting dullness, fluid wave, hepatomegaly, mass or pulsatile mass.     Tenderness: There is no abdominal tenderness. There is no right CVA tenderness, left CVA tenderness, guarding or rebound.     Hernia: No hernia is present.       Results for orders placed or performed in visit on 12/15/23  POCT urinalysis dipstick  Result Value Ref Range   Color, UA yellow    Clarity, UA clear    Glucose, UA Negative Negative   Bilirubin, UA neg    Ketones, UA neg    Spec Grav, UA 1.015 1.010 - 1.025   Blood, UA trace    pH, UA 7.5 5.0 - 8.0   Protein, UA Negative Negative   Urobilinogen, UA negative (A) 0.2 or 1.0 E.U./dL   Nitrite, UA neg    Leukocytes, UA Moderate (2+) (A) Negative   Appearance     Odor      Assessment & Plan     Problem List Items Addressed This Visit       Cardiovascular and Mediastinum   Essential hypertension   Hypertension,chronic Blood pressure elevated at 171/83 mmHg.  Previously on valsartan  80 mg twice a day, reduced to 80 mg once a day. Reports blood pressure readings are usually lower and advised by a cardiologist not to worry about low readings. Discussed the importance of resuming the previous dosage to manage elevated blood pressure effectively. - Resume valsartan  80 mg twice a day - Schedule follow-up appointment with Dr. Gasper within the next month to monitor blood pressure - Provide prescription for additional valsartan  to ensure adequate supply      Relevant Medications   valsartan  (  DIOVAN ) 80 MG tablet     Genitourinary   Cystitis - Primary   Relevant Medications   nitrofurantoin , macrocrystal-monohydrate, (MACROBID ) 100 MG capsule   Other Relevant Orders   POCT urinalysis dipstick (Completed)   Urine Culture       Cystitis Presents with dysuria and burning sensation during urination following a course of azithromycin  for acute bronchitis. Symptoms began on the last day of the antibiotic course. E. coli UTI resistant to Bactrim , levofloxacin, ciprofloxacin , and ampicillin. Current urinalysis shows trace blood but no nitrites. No CVA tenderness noted. Symptoms and history suggest cystitis. Previous culture showed resistance to Bactrim . Macrobid  is a suitable alternative with good efficacy for uncomplicated UTIs. - Prescribe Macrobid  100 mg twice a day for 7 days - Order urine culture to confirm diagnosis and check for antibiotic resistance        Return in about 1 month (around 01/15/2024) for HTN.         Rockie Agent, MD  [facility] [phone] (phone) [phone] (fax)  [facility]

## 2023-12-15 NOTE — Assessment & Plan Note (Signed)
>>  ASSESSMENT AND PLAN FOR ESSENTIAL HYPERTENSION WRITTEN ON 12/15/2023 10:58 PM BY SIMMONS-ROBINSON, MAKIERA, MD  Hypertension,chronic Blood pressure elevated at 171/83 mmHg. Previously on valsartan  80 mg twice a day, reduced to 80 mg once a day. Reports blood pressure readings are usually lower and advised by a cardiologist not to worry about low readings. Discussed the importance of resuming the previous dosage to manage elevated blood pressure effectively. - Resume valsartan  80 mg twice a day - Schedule follow-up appointment with Dr. Shann Darnel within the next month to monitor blood pressure - Provide prescription for additional valsartan  to ensure adequate supply

## 2023-12-15 NOTE — Assessment & Plan Note (Signed)
 Hypertension,chronic Blood pressure elevated at 171/83 mmHg. Previously on valsartan  80 mg twice a day, reduced to 80 mg once a day. Reports blood pressure readings are usually lower and advised by a cardiologist not to worry about low readings. Discussed the importance of resuming the previous dosage to manage elevated blood pressure effectively. - Resume valsartan  80 mg twice a day - Schedule follow-up appointment with Dr. Gasper within the next month to monitor blood pressure - Provide prescription for additional valsartan  to ensure adequate supply

## 2023-12-15 NOTE — Patient Instructions (Signed)
 VISIT SUMMARY:  Today, we addressed your concerns about painful urination and elevated blood pressure. You reported a burning sensation during urination after completing a course of antibiotics for bronchitis and noted elevated blood pressure readings. We discussed your symptoms, reviewed your medications, and made adjustments to your treatment plan.  YOUR PLAN:  -CYSTITIS: Cystitis is an inflammation of the bladder, often caused by a bacterial infection. You are experiencing symptoms such as a burning sensation during urination. We have prescribed Macrobid  100 mg to be taken twice a day for 7 days and ordered a urine culture to confirm the diagnosis and check for antibiotic resistance.  -HYPERTENSION: Hypertension is high blood pressure, which can lead to serious health issues if not managed properly. Your blood pressure was elevated today. We discussed the importance of resuming your previous dosage of valsartan , 80 mg twice a day, to help manage your blood pressure effectively. We also scheduled a follow-up appointment with Dr. Gasper within the next month to monitor your blood pressure and provided a prescription for additional valsartan .  INSTRUCTIONS:  Please take Macrobid  100 mg twice a day for 7 days as prescribed. Resume taking valsartan  80 mg twice a day to manage your blood pressure. Schedule a follow-up appointment with Dr. Gasper within the next month to monitor your blood pressure. A urine culture has been ordered to confirm the diagnosis and check for antibiotic resistance.

## 2023-12-16 ENCOUNTER — Other Ambulatory Visit: Payer: Self-pay | Admitting: Family Medicine

## 2023-12-20 LAB — URINE CULTURE

## 2023-12-21 ENCOUNTER — Encounter: Payer: Self-pay | Admitting: Family Medicine

## 2023-12-21 ENCOUNTER — Ambulatory Visit (INDEPENDENT_AMBULATORY_CARE_PROVIDER_SITE_OTHER): Payer: Medicare Other | Admitting: Family Medicine

## 2023-12-21 ENCOUNTER — Other Ambulatory Visit: Payer: Self-pay

## 2023-12-21 ENCOUNTER — Ambulatory Visit: Payer: Self-pay | Admitting: *Deleted

## 2023-12-21 ENCOUNTER — Telehealth: Payer: Self-pay | Admitting: Cardiovascular Disease

## 2023-12-21 ENCOUNTER — Emergency Department
Admission: EM | Admit: 2023-12-21 | Discharge: 2023-12-21 | Disposition: A | Discharge status: Home / Self Care | Payer: Medicare Other | Attending: Emergency Medicine | Admitting: Emergency Medicine

## 2023-12-21 ENCOUNTER — Encounter: Payer: Self-pay | Admitting: Emergency Medicine

## 2023-12-21 VITALS — BP 160/77 | HR 76 | Ht 66.0 in | Wt 289.0 lb

## 2023-12-21 DIAGNOSIS — H579 Unspecified disorder of eye and adnexa: Secondary | ICD-10-CM

## 2023-12-21 DIAGNOSIS — I10 Essential (primary) hypertension: Secondary | ICD-10-CM

## 2023-12-21 DIAGNOSIS — H5789 Other specified disorders of eye and adnexa: Secondary | ICD-10-CM | POA: Diagnosis not present

## 2023-12-21 DIAGNOSIS — I160 Hypertensive urgency: Secondary | ICD-10-CM | POA: Diagnosis not present

## 2023-12-21 LAB — CBC WITH DIFFERENTIAL/PLATELET
Abs Immature Granulocytes: 0.03 10*3/uL (ref 0.00–0.07)
Basophils Absolute: 0 10*3/uL (ref 0.0–0.1)
Basophils Relative: 1 %
Eosinophils Absolute: 0.2 10*3/uL (ref 0.0–0.5)
Eosinophils Relative: 2 %
HCT: 36.3 % (ref 36.0–46.0)
Hemoglobin: 11.3 g/dL — ABNORMAL LOW (ref 12.0–15.0)
Immature Granulocytes: 0 %
Lymphocytes Relative: 21 %
Lymphs Abs: 1.7 10*3/uL (ref 0.7–4.0)
MCH: 27.5 pg (ref 26.0–34.0)
MCHC: 31.1 g/dL (ref 30.0–36.0)
MCV: 88.3 fL (ref 80.0–100.0)
Monocytes Absolute: 0.4 10*3/uL (ref 0.1–1.0)
Monocytes Relative: 5 %
Neutro Abs: 5.7 10*3/uL (ref 1.7–7.7)
Neutrophils Relative %: 71 %
Platelets: 186 10*3/uL (ref 150–400)
RBC: 4.11 MIL/uL (ref 3.87–5.11)
RDW: 14.1 % (ref 11.5–15.5)
WBC: 8.1 10*3/uL (ref 4.0–10.5)
nRBC: 0 % (ref 0.0–0.2)

## 2023-12-21 LAB — BASIC METABOLIC PANEL
Anion gap: 10 (ref 5–15)
BUN: 11 mg/dL (ref 8–23)
CO2: 23 mmol/L (ref 22–32)
Calcium: 8.8 mg/dL — ABNORMAL LOW (ref 8.9–10.3)
Chloride: 103 mmol/L (ref 98–111)
Creatinine, Ser: 0.89 mg/dL (ref 0.44–1.00)
GFR, Estimated: >60 mL/min (ref >60)
Glucose, Bld: 99 mg/dL (ref 70–99)
Potassium: 4 mmol/L (ref 3.5–5.1)
Sodium: 136 mmol/L (ref 135–145)

## 2023-12-21 NOTE — Telephone Encounter (Signed)
  Pt c/o BP issue: STAT if pt c/o blurred vision, one-sided weakness or slurred speech  1. What are your last 5 BP readings?  3am 170/68 took valsartan  80 mg  167/73 Took another valsartan  40 mg 6:30 am 170/67  2. Are you having any other symptoms (ex. Dizziness, headache, blurred vision, passed out)? Pain behind her left eye - she did call her eye doctor today to get an appt   3. What is your BP issue?  The patient stated that her blood pressure has been elevated for a couple of days. She is becoming concerned because she has started taking her valsartan  80 mg both in the morning and evening, but it is still not helping her blood pressure. She has an appointment with her PCP today but would also like to get Dr. Adolphus Hoops recommendation, as he adjusted some of her blood pressure medications.

## 2023-12-21 NOTE — Telephone Encounter (Signed)
 Reason for Disposition  Systolic BP  >= 160 OR Diastolic >= 100  Answer Assessment - Initial Assessment Questions 1. BLOOD PRESSURE: "What is the blood pressure?" "Did you take at least two measurements 5 minutes apart?"     170/67, 174/68- last night- did not remember medication  2. ONSET: "When did you take your blood pressure?"     7:45, last night 3. HOW: "How did you take your blood pressure?" (e.g., automatic home BP monitor, visiting nurse)     Automatic cuff 4. HISTORY: "Do you have a history of high blood pressure?"     High BP in the office 5. MEDICINES: "Are you taking any medicines for blood pressure?" "Have you missed any doses recently?"     Patient has started valsartan  80 am,pm 6. OTHER SYMPTOMS: "Do you have any symptoms?" (e.g., blurred vision, chest pain, difficulty breathing, headache, weakness)     Eye pain since last night- left- no redness,drainage  Protocols used: Blood Pressure - High-A-AH

## 2023-12-21 NOTE — Progress Notes (Signed)
 Established patient visit   Patient: Kaylee Reyes   DOB: [birth year]   73 y.o. Female  MRN: [id number] Visit Date: 12/21/2023  Today's healthcare provider: Carlean Charter, DO   Chief Complaint  Patient presents with   Hypertension   Subjective    Hypertension Pertinent negatives include no blurred vision, chest pain, orthopnea, palpitations or shortness of breath.   Hydrochlorothiazide  2016-2023  The patient, with a history of hypertension, was previously managed on hydrochlorothiazide  and atenolol  for several years. However, due to an episode of pancreatitis, the hydrochlorothiazide  was discontinued. The patient's blood pressure management was further complicated by a diagnosis of COVID-19, which led to heart palpitations and elevated blood pressure. As a result, the atenolol , which had been used for many years, was deemed ineffective.  The patient was then switched to valsartan , initially at 40 mg twice daily, which was subsequently increased to 80 mg twice daily. However, the patient was adjusted back down to 80 mg once daily when blood pressure readings were within normal range.  During her visit 12/15/2023, her valsartan  was increased back to 80 mg twice daily. Recently, the patient has been experiencing elevated blood pressure readings despite taking the valsartan . The patient has been diligent in monitoring blood pressure at home and has noted that the medication does not seem to be effectively lowering the blood pressure as it previously did.  Doses taken today:   - 80 mg at 0330   - 40 mg at 0430   - 80 mg at 0730   - 80 mg at 1230  In addition to the hypertension concerns, the patient has also been experiencing a new onset of eye pain, described as a pulsating, sharp pain that has since evolved into a feeling of pressure. This symptom has raised concerns about a potential relationship between the elevated blood pressure and the eye discomfort.  The patient's recent  medical history also includes a urinary tract infection, for which she was prescribed nitrofurantoin . The patient has been adherent to this medication regimen and the infection appears to be responding to the treatment.     Medications: Outpatient Medications Prior to Visit  Medication Sig   B Complex Vitamins (VITAMIN-B COMPLEX PO) Take by mouth.   Carboxymeth-Glycerin-Polysorb (REFRESH OPTIVE MEGA-3 OP) Place 1 drop into both eyes as needed.   docusate sodium  (COLACE) 100 MG capsule Take 200 mg by mouth daily. 2 in am, 1 at night   fexofenadine (ALLEGRA) 60 MG tablet Take 60 mg by mouth daily.   fluticasone  (FLONASE ) 50 MCG/ACT nasal spray SHAKE LIQUID AND USE 1 SPRAY IN EACH NOSTRIL DAILY (Patient taking differently: as needed.)   lansoprazole  (PREVACID ) 30 MG capsule TAKE 1 CAPSULE DAILY AT 12 NOON (NEED OFFICE VISIT PRIOR TO FURTHER REFILLS)   levothyroxine  (SYNTHROID ) 75 MCG tablet TAKE 1 TABLET DAILY   meclizine  (ANTIVERT ) 12.5 MG tablet TAKE 1 TABLET(12.5 MG) BY MOUTH THREE TIMES DAILY AS NEEDED FOR DIZZINESS   nitrofurantoin , macrocrystal-monohydrate, (MACROBID ) 100 MG capsule Take 1 capsule (100 mg total) by mouth 2 (two) times daily for 7 days.   triamcinolone  (KENALOG ) 0.025 % cream Apply 1 Application topically 2 (two) times daily. As needed   valsartan  (DIOVAN ) 80 MG tablet Take 1 tablet (80 mg total) by mouth 2 (two) times daily.   Vitamin D , Ergocalciferol , (DRISDOL ) 1.25 MG (50000 UNIT) CAPS capsule TAKE 1 CAPSULE BY MOUTH EVERY 7 DAYS   [DISCONTINUED] prednisoLONE acetate (PRED FORTE) 1 % ophthalmic suspension  Place 1 drop into the left eye 4 (four) times daily.   [DISCONTINUED] promethazine -dextromethorphan (PROMETHAZINE -DM) 6.25-15 MG/5ML syrup Take 5 mLs by mouth 4 (four) times daily as needed.   [DISCONTINUED] sucralfate  (CARAFATE ) 1 g tablet TAKE 1 TABLET(1 GRAM) BY MOUTH FOUR TIMES DAILY WITH MEALS AND AT BEDIME FOR 10 DAYS   No facility-administered medications prior  to visit.   Review of Systems  Eyes:  Positive for pain. Negative for blurred vision.  Respiratory: Negative.  Negative for cough, shortness of breath and wheezing.   Cardiovascular:  Negative for chest pain, palpitations and orthopnea.  Neurological:  Negative for weakness.        Objective    BP (!) 160/77 (BP Location: Left Wrist, Cuff Size: Large)   Pulse 76   Ht 5\' 6"  (1.676 m)   Wt 289 lb (131.1 kg)   BMI 46.65 kg/m     Physical Exam Vitals and nursing note reviewed.  Constitutional:      General: She is not in acute distress.    Appearance: Normal appearance.  HENT:     Head: Normocephalic and atraumatic.  Eyes:     General: No scleral icterus.    Extraocular Movements: Extraocular movements intact.     Conjunctiva/sclera: Conjunctivae normal.  Cardiovascular:     Rate and Rhythm: Normal rate and regular rhythm.     Pulses: Normal pulses.     Heart sounds: Normal heart sounds.  Pulmonary:     Effort: Pulmonary effort is normal. No respiratory distress.     Breath sounds: Normal breath sounds.  Musculoskeletal:     Right lower leg: No edema.     Left lower leg: No edema.  Skin:    General: Skin is warm and dry.  Neurological:     Mental Status: She is alert and oriented to person, place, and time. Mental status is at baseline.  Psychiatric:        Mood and Affect: Mood normal.        Behavior: Behavior normal.      No results found for any visits on 12/21/23.  Assessment & Plan    Hypertensive urgency  Essential hypertension    Hypertensive Urgency vs. Emergency in the setting of known hypertension Hypertension managed with valsartan  80 mg twice daily. Recent elevated readings despite adherence. Reports eye pain and pressure, potentially related to elevated blood pressure. Discussed risks of untreated hypertension, including eye stroke and vision loss. Recommended ER visit for potential IV antihypertensive treatment. - Refer to ER for potential IV  antihypertensive treatment - Continue valsartan  80 mg twice daily - Keep eye appointment with Dr. Alto Atta tomorrow at 11:15 AM  Urinary Tract Infection Recent UTI treated with nitrofurantoin . Culture confirmed sensitivity. Symptoms improving. - Continue nitrofurantoin  as prescribed  Follow-up - Follow up with primary care physician after ER visit - Attend eye appointment with Dr. Alto Atta tomorrow at 11:15 AM.   Return for w/PCP as scheduled 01/16/24.      I discussed the assessment and treatment plan with the patient  The patient was provided an opportunity to ask questions and all were answered. The patient agreed with the plan and demonstrated an understanding of the instructions.   The patient was advised to call back or seek an in-person evaluation if the symptoms worsen or if the condition fails to improve as anticipated.  Total time was 40 minutes. That includes chart review before the visit, the actual patient visit, and time spent on documentation after  the visit.    Carlean Charter, DO  [facility] [phone] (phone) [phone] (fax)  [facility]

## 2023-12-21 NOTE — Telephone Encounter (Signed)
 Pt. Calling back, BP is now 179/85. States "I feel ok, a little dizzy." Asking if she can be worked in today. Please advise.

## 2023-12-21 NOTE — ED Provider Notes (Signed)
 [facility] Provider Note    Event Date/Time   First MD Initiated Contact with Patient 12/21/23 2142     (approximate)   History   Hypertension and Eye Pain   HPI  Kaylee Reyes is a 73 year old female presenting to the emergency department for evaluation of elevated blood pressure and left eye pressure.  Patient reports that a few weeks ago she had some upper respiratory symptoms followed by a UTI for which she was seen a few times in urgent care.  Both times she was noted to have elevated blood pressure and she was instructed to increase her valsartan  from once a day to twice a day.  Today, she saw her PCP and had blood pressures ranging from 160s to 170s systolics.  Her highest BP has been 179/80s.  She denies any chest pain or shortness of breath.  Over the past few days she has had some pain and pressure over her left eye.  Otherwise denies headache.  No numbness, tingling, focal weakness.  No vision changes.  No trauma.  No foreign body sensation.  She has an appointment with an eye doctor tomorrow for further evaluation.  She was sent to the ER for further evaluation of her blood pressure.  Per documentation, PCP sent for consideration of IV antihypertensive treatment.    Physical Exam   Triage Vital Signs: ED Triage Vitals  Encounter Vitals Group     BP 12/21/23 1644 (!) 195/88     Systolic BP Percentile --      Diastolic BP Percentile --      Pulse Rate 12/21/23 1644 73     Resp 12/21/23 1644 20     Temp 12/21/23 1644 98 F (36.7 C)     Temp Source 12/21/23 1644 Oral     SpO2 12/21/23 1644 99 %     Weight 12/21/23 1645 289 lb (131.1 kg)     Height 12/21/23 1645 5\' 6"  (1.676 m)     Head Circumference --      Peak Flow --      Pain Score 12/21/23 1645 5     Pain Loc --      Pain Education --      Exclude from Growth Chart --     Most recent vital signs: Vitals:   12/21/23 1644 12/21/23 2125  BP: (!) 195/88 (!) 165/80  Pulse: 73 78   Resp: 20 18  Temp: 98 F (36.7 C)   SpO2: 99% 99%     General: Awake, interactive  HEENT: Pupils equal and reactive to light, normal extraocular movements, vision 20/15 in right eye, 20/20 in left eye with corrective lenses in place, IOP 14 on right, 15 on left CV:  Regular rate, good peripheral perfusion.  Resp:  Unlabored respirations, lungs clear to auscultation Abd:  Nondistended, soft, nontender to palpation Neuro:  Alert and oriented, symmetric facial movement, sensation intact over bilateral upper and lower extremities with 5 out of 5 strength.  Normal finger-to-nose testing.   ED Results / Procedures / Treatments   Labs (all labs ordered are listed, but only abnormal results are displayed) Labs Reviewed  CBC WITH DIFFERENTIAL/PLATELET - Abnormal; Notable for the following components:      Result Value   Hemoglobin 11.3 (*)    All other components within normal limits  BASIC METABOLIC PANEL - Abnormal; Notable for the following components:   Calcium  8.8 (*)    All other components within normal limits  EKG EKG independently reviewed interpreted by myself (ER attending) demonstrates:    RADIOLOGY Imaging independently reviewed and interpreted by myself demonstrates:    PROCEDURES:  Critical Care performed: No  Procedures   MEDICATIONS ORDERED IN ED: Medications - No data to display   IMPRESSION / MDM / ASSESSMENT AND PLAN / ED COURSE  I reviewed the triage vital signs and the nursing notes.  Differential diagnosis includes, but is not limited to, hypertensive urgency, elevated blood pressure in setting of acute illness, worsening primary hypertension, low suspicion for hypertensive emergency based on clinical history  Patient's presentation is most consistent with acute presentation with potential threat to life or bodily function.  73 year old female presenting to the emergency department for evaluation of elevated blood pressure and left eye  pressure.  Initially hypertensive on presentation to the ER, at the time of my evaluation, patient's blood pressure had significantly improved without intervention down to 140s/80s.  She continues to report some mild discomfort over her left eye, but has reassuring intraocular pressures and physical exam.  She has no deficits suggestive of acute stroke.  Very low suspicion for intracranial bleed.  Discussed CT, but patient is comfortable with holding off which I do think is reasonable.  She has follow-up with an eye specialist tomorrow.  I do not feel she has any indication for IV antihypertensive therapy currently, I did discuss the harms of precipitating hypotension.  Patient is comfortable with discharge and outpatient follow-up.  She additionally has follow-up with cardiology in the near future.  She was instructed to keep a blood pressure log until her follow-up and continue taking her blood pressure medicine as directed until then.  Strict return precautions provided.  Patient discharged stable condition.    FINAL CLINICAL IMPRESSION(S) / ED DIAGNOSES   Final diagnoses:  Eye pressure  Uncontrolled hypertension     Rx / DC Orders   ED Discharge Orders     None        Note:  This document was prepared using Dragon voice recognition software and may include unintentional dictation errors.   Claria Crofts, MD 12/21/23 2215

## 2023-12-21 NOTE — ED Triage Notes (Signed)
 Patient ambulatory to triage with complaints of hypertension for approx 2 weeks and now complaints of left eye pain/pressure. Denies vision changes. Patient states she took an extra 80mg  valsarten today to try to get her BP down prior to coming. She does state she took her BP meds late yesterday as she forgot.  Was seen at PCP's office today who sent her to ER for her symptoms.

## 2023-12-21 NOTE — Telephone Encounter (Signed)
  Chief Complaint: fluctuating BP-170/67 Symptoms: patient reports she has been monitoring BP and changing her dose of Valsartan  80-40- but she may have forgotten a dose- she states her BP has been fluctuating. She has started having left eye pain- she is going to contact eye doctor- she had cataract surgery recently. Patient has also going to contact cardiologist to see if she can be seen. Patient scheduled appointment at office Friday- and has been advised to call back if she has symptoms- chest pain, dizziness, etc .  Frequency: stared yesterday Pertinent Negatives: Patient denies chest pain, difficulty breathing, headache Disposition: [] ED /[] Urgent Care (no appt availability in office) / [x] Appointment(In office/virtual)/ []  [facility]/ [] Home Care/ [] Refused Recommended Disposition /[] [facility]/ []  Follow-up with PCP Additional Notes: Sending message to PCP to see if patient can be seen sooner

## 2023-12-21 NOTE — Telephone Encounter (Addendum)
Called and spoke with patient. Patient reports that about 2 weeks ago she was seen at urgent care for bronchitis and at the time her blood pressure was elevated. Patient reports that about a week ago she was seen again at urgent care for UTI and her blood pressure was elevated as well. Patient reports that she was instructed at that time to increase her Valsartan 80 Mg to twice a day from once daily. Patient states that she started taking Valsartan twice daily two days ago. Patient reports that last night she forgot to take the night time dose of Valsartan and she checked her blood pressure at 3:00 am and it was 179/81. Patient states that she rechecked her blood pressure again at 4:30 am and her blood pressure was 167/73. Patient states that she took an extra 40 MG of Valsartan at that time. Patient reports that her blood pressure was 177/82 at 6:00 am and that she took her morning 80 MG Valsartan. Patient says that she has checked her blood pressure again around 9:30 am and her blood pressure remained elevated at 179/80's. Patient scheduled in clinic on 12/27/23. Patient instructed to check blood pressure 2 hours after medications. Patient encouraged to bring a log of blood pressures to upcoming appointment. Patient verbalizes understanding. Will forward to MD for further recommendations.

## 2023-12-21 NOTE — Discharge Instructions (Signed)
 You were seen in the emergency room today for your eye pressure and elevated blood pressure.  Your blood work was fortunately reassuring as well as your exam.  Please keep your scheduled follow-up with the eye doctor tomorrow and cardiology.  Keep a blood pressure log until then.  Return to the ER for new or worsening symptoms including chest pain, shortness of breath, new numbness, tingling, weakness, worsening headache, or any other new or concerning symptoms.

## 2023-12-21 NOTE — ED Provider Triage Note (Signed)
 Emergency Medicine Provider Triage Evaluation Note  Kaylee Reyes , a 73 y.o. female  was evaluated in triage.  Pt complains of left leg pain, having high blood pressure.  Patient states happens tomorrow and appointment with ophthalmology  Review of Systems  Positive:  Negative:   Physical Exam  BP (!) 195/88 (BP Location: Left Arm)   Pulse 73   Temp 98 F (36.7 C) (Oral)   Resp 20   Ht 5\' 6"  (1.676 m)   Wt 131.1 kg   SpO2 99%   BMI 46.65 kg/m  Gen:   Awake, no distress   Left eye: harder than right. Resp:  Normal effort  MSK:   Moves extremities without difficulty  Other:    Medical Decision Making  Medically screening exam initiated at 4:52 PM.  Appropriate orders placed.  Cherilynn Cornea was informed that the remainder of the evaluation will be completed by another provider, this initial triage assessment does not replace that evaluation, and the importance of remaining in the ED until their evaluation is complete.  Patient with history of high blood pressure ordered CBC CMP.Glaucoma?   Awilda Lennox, PA-C 12/21/23 1654

## 2023-12-21 NOTE — Telephone Encounter (Signed)
 Appt made 12/21/23 @ 3:00

## 2023-12-22 DIAGNOSIS — H5712 Ocular pain, left eye: Secondary | ICD-10-CM | POA: Diagnosis not present

## 2023-12-22 DIAGNOSIS — Z961 Presence of intraocular lens: Secondary | ICD-10-CM | POA: Diagnosis not present

## 2023-12-22 DIAGNOSIS — H04123 Dry eye syndrome of bilateral lacrimal glands: Secondary | ICD-10-CM | POA: Diagnosis not present

## 2023-12-22 DIAGNOSIS — H16143 Punctate keratitis, bilateral: Secondary | ICD-10-CM | POA: Diagnosis not present

## 2023-12-23 ENCOUNTER — Ambulatory Visit: Payer: Medicare Other | Admitting: Physician Assistant

## 2023-12-23 NOTE — Telephone Encounter (Signed)
Called patient and notified her of the following from Dr. Mariah Milling.  Would recommend she stay on valsartan 80 twice a day for now though in the future this could be increased Several medications we could try to get her blood pressure down Would recommend we start Cardura/doxazosin 1 mg twice daily This can be titrated up slowly as needed Thx TGollan   Patient verbalizes understanding. Patient states that she wants to wait until seen in clinic on Tuesday before starting new medication.

## 2023-12-27 ENCOUNTER — Ambulatory Visit: Payer: Medicare Other | Admitting: Physician Assistant

## 2023-12-30 ENCOUNTER — Ambulatory Visit (INDEPENDENT_AMBULATORY_CARE_PROVIDER_SITE_OTHER): Payer: Medicare Other | Admitting: Physician Assistant

## 2023-12-30 ENCOUNTER — Encounter: Payer: Self-pay | Admitting: Physician Assistant

## 2023-12-30 ENCOUNTER — Ambulatory Visit: Payer: Self-pay | Admitting: *Deleted

## 2023-12-30 VITALS — BP 160/70 | HR 82 | Resp 16 | Ht 66.0 in | Wt 289.0 lb

## 2023-12-30 DIAGNOSIS — R197 Diarrhea, unspecified: Secondary | ICD-10-CM

## 2023-12-30 DIAGNOSIS — R3915 Urgency of urination: Secondary | ICD-10-CM

## 2023-12-30 LAB — POCT URINALYSIS DIPSTICK
Appearance: NORMAL
Bilirubin, UA: NEGATIVE
Blood, UA: NEGATIVE
Glucose, UA: NEGATIVE
Ketones, UA: NEGATIVE
Leukocytes, UA: NEGATIVE
Nitrite, UA: NEGATIVE
Protein, UA: NEGATIVE
Spec Grav, UA: >=1.03 — AB (ref 1.010–1.025)
Urobilinogen, UA: 0.2 U/dL
pH, UA: 6 (ref 5.0–8.0)

## 2023-12-30 NOTE — Progress Notes (Signed)
Acute Office Visit   Patient: Kaylee Reyes   DOB: March 22, 1951   73 y.o. Female  MRN: [id number] Visit Date: 12/30/2023  Today's healthcare provider: Oswaldo Conroy Ebone Alcivar, PA-C  Introduced myself to the patient as a Secondary school teacher and provided education on APPs in clinical practice.    Chief Complaint  Patient presents with   Possible UTI Return    Patient states finished recent course of rx, but sx returned. Still having urgency   Subjective    HPI HPI     Possible UTI Return    Additional comments: Patient states finished recent course of rx, but sx returned. Still having urgency      Last edited by Dollene Primrose, CMA on 12/30/2023 10:45 AM.       Discussed the use of AI scribe software for clinical note transcription with the patient, who gave verbal consent to proceed.  History of Present Illness   The patient presents with ongoing urinary symptoms, including a burning sensation and urgency to urinate. She reports that these symptoms have been disturbing her sleep. She denies any difficulty in urination or any pain during the process. She also mentions an episode of fecal incontinence, which she attributes to recent antibiotic use. She has a history of lower back pain, but she does not believe it is related to her current urinary symptoms. She denies any visible hematuria, vaginal pain, bleeding, or discharge. She also denies any fevers or chills. She mentions that she has recently completed a course of antibiotics. She has been experiencing these symptoms for a couple of weeks.        Medications: Outpatient Medications Prior to Visit  Medication Sig   B Complex Vitamins (VITAMIN-B COMPLEX PO) Take by mouth.   Carboxymeth-Glycerin-Polysorb (REFRESH OPTIVE MEGA-3 OP) Place 1 drop into both eyes as needed.   docusate sodium (COLACE) 100 MG capsule Take 200 mg by mouth daily. 2 in am, 1 at night   fexofenadine (ALLEGRA) 60 MG tablet Take 60 mg by mouth daily.   fluticasone  (FLONASE) 50 MCG/ACT nasal spray SHAKE LIQUID AND USE 1 SPRAY IN EACH NOSTRIL DAILY (Patient taking differently: as needed.)   lansoprazole (PREVACID) 30 MG capsule TAKE 1 CAPSULE DAILY AT 12 NOON (NEED OFFICE VISIT PRIOR TO FURTHER REFILLS)   levothyroxine (SYNTHROID) 75 MCG tablet TAKE 1 TABLET DAILY   meclizine (ANTIVERT) 12.5 MG tablet TAKE 1 TABLET(12.5 MG) BY MOUTH THREE TIMES DAILY AS NEEDED FOR DIZZINESS   triamcinolone (KENALOG) 0.025 % cream Apply 1 Application topically 2 (two) times daily. As needed   valsartan (DIOVAN) 80 MG tablet Take 1 tablet (80 mg total) by mouth 2 (two) times daily.   Vitamin D, Ergocalciferol, (DRISDOL) 1.25 MG (50000 UNIT) CAPS capsule TAKE 1 CAPSULE BY MOUTH EVERY 7 DAYS   No facility-administered medications prior to visit.    Review of Systems  Constitutional:  Negative for chills, fatigue and fever.  Genitourinary:  Positive for dysuria, frequency and urgency. Negative for decreased urine volume, difficulty urinating, enuresis, flank pain, hematuria, vaginal bleeding, vaginal discharge and vaginal pain.  Musculoskeletal:  Positive for back pain.        Objective    BP (!) 160/70   Pulse 82   Resp 16   Ht 5\' 6"  (1.676 m)   Wt 289 lb (131.1 kg)   SpO2 97%   BMI 46.65 kg/m     Physical Exam Vitals reviewed.  Constitutional:  General: She is awake.     Appearance: Normal appearance. She is well-developed and well-groomed.  HENT:     Head: Normocephalic and atraumatic.  Eyes:     General: Lids are normal. Gaze aligned appropriately.     Extraocular Movements: Extraocular movements intact.     Conjunctiva/sclera: Conjunctivae normal.  Pulmonary:     Effort: Pulmonary effort is normal.  Neurological:     General: No focal deficit present.     Mental Status: She is alert and oriented to person, place, and time.     GCS: GCS eye subscore is 4. GCS verbal subscore is 5. GCS motor subscore is 6.     Cranial Nerves: No cranial nerve  deficit, dysarthria or facial asymmetry.  Psychiatric:        Attention and Perception: Attention and perception normal.        Mood and Affect: Mood and affect normal.        Speech: Speech normal.        Behavior: Behavior normal. Behavior is cooperative.       Results for orders placed or performed in visit on 12/30/23  POCT urinalysis dipstick  Result Value Ref Range   Color, UA Yellow    Clarity, UA Clear    Glucose, UA Negative Negative   Bilirubin, UA Negative    Ketones, UA Negative    Spec Grav, UA >=1.030 (A) 1.010 - 1.025   Blood, UA Negative    pH, UA 6.0 5.0 - 8.0   Protein, UA Negative Negative   Urobilinogen, UA 0.2 0.2 or 1.0 E.U./dL   Nitrite, UA Negative    Leukocytes, UA Negative Negative   Appearance Normal    Odor None     Assessment & Plan      No follow-ups on file.      Problem List Items Addressed This Visit   None Visit Diagnoses       Urinary urgency    -  Primary   Relevant Orders   Urine Culture   POCT urinalysis dipstick (Completed)     Acute diarrhea          Assessment and Plan    Urinary Frequency  Presents with dysuria and urinary frequency. No fever or chills. Previous urinalysis showed microscopic hematuria. Differential includes cystitis and pyelonephritis, but no flank pain or systemic symptoms to suggest the latter. Discussed importance of urine sample for diagnosis and potential empirical antibiotic therapy based on previous culture results if UTI is confirmed. - Obtain urine sample before leaving clinic - If unable to provide sample, send home with hat and sample cup to return by end of day - Consider empirical antibiotic therapy based on previous culture results if UTI is confirmed  Diarrhea Ongoing diarrhea, possibly related to recent antibiotic use. No other gastrointestinal symptoms. Differential includes antibiotic-associated diarrhea and Clostridium difficile infection. Discussed potential need for probiotics or  change in antibiotic regimen if confirmed to be antibiotic-associated. - Monitor symptoms - Consider stool sample if symptoms persist or worsen - Discuss potential need for probiotics or change in antibiotic regimen if confirmed to be antibiotic-associated  General Health Maintenance Ensuring proper hydration and follow-up for UTI symptoms is important. - Advise increased water intake to facilitate urine sample collection - Provide guidance on signs and symptoms warranting immediate medical attention, such as high fever, severe back pain, or worsening symptoms  Follow-up - Return urine sample by end of day if unable to provide in clinic -  Schedule follow-up appointment based on urine sample results and symptom progression.        No follow-ups on file.   I, Sharlisa Hollifield E Mitchel Delduca, PA-C, have reviewed all documentation for this visit. The documentation on 12/30/23 for the exam, diagnosis, procedures, and orders are all accurate and complete.   Jacquelin Hawking, MHS, PA-C [facility]

## 2023-12-30 NOTE — Progress Notes (Signed)
Your urine testing was negative for signs of a UTI- no antibiotics are indicated at this time. We have already drawn up a sample for a culture so we will keep you updated on those results once they are available. Please make sure you are staying well hydrated and let us know if your symptoms persist.

## 2023-12-30 NOTE — Telephone Encounter (Signed)
  Chief Complaint: burning at end of urine stream Symptoms: pressure-urgency, pain Frequency: patient was treated for UTI beginning of the month- got some better- but did not clear Pertinent Negatives: Patient denies fever Disposition: [] ED /[] Urgent Care (no appt availability in office) / [x] Appointment(In office/virtual)/ []  [facility]/ [] Home Care/ [] Refused Recommended Disposition /[]  Mobile Bus/ []  Follow-up with PCP Additional Notes: No open appointment in office- patient scheduled with float provider   Summary: UTI symptoms not improving, seeking appt   Pt called reporting that her UTI symptoms have not improved and she wants to be seen again today. Nothing available with any PCP at [facility], please advise  Best contact: [phone]         Reason for Disposition  All other patients with painful urination  (Exception: [1] EITHER frequency or urgency AND [2] has on-call doctor.)  Answer Assessment - Initial Assessment Questions 1. SEVERITY: "How bad is the pain?"  (e.g., Scale 1-10; mild, moderate, or severe)   - MILD (1-3): complains slightly about urination hurting   - MODERATE (4-7): interferes with normal activities     - SEVERE (8-10): excruciating, unwilling or unable to urinate because of the pain      mild 2. FREQUENCY: "How many times have you had painful urination today?"      Burning after urination 3. PATTERN: "Is pain present every time you urinate or just sometimes?"       every time 4. ONSET: "When did the painful urination start?"      Beginning of January- treatment seems to help symptoms 5. FEVER: "Do you have a fever?" If Yes, ask: "What is your temperature, how was it measured, and when did it start?"     no 6. PAST UTI: "Have you had a urine infection before?" If Yes, ask: "When was the last time?" and "What happened that time?"      Recently treated for UTI- Ecoli 7. CAUSE: "What do you think is causing the painful urination?"  (e.g.,  UTI, scratch, Herpes sore)     UTI  Protocols used: Urination Pain - Female-A-AH

## 2023-12-31 LAB — URINE CULTURE
MICRO NUMBER:: 15995581
SPECIMEN QUALITY:: ADEQUATE

## 2024-01-10 ENCOUNTER — Other Ambulatory Visit: Payer: Self-pay | Admitting: Family Medicine

## 2024-01-10 ENCOUNTER — Other Ambulatory Visit: Payer: Self-pay | Admitting: Cardiovascular Disease

## 2024-01-13 DIAGNOSIS — H16143 Punctate keratitis, bilateral: Secondary | ICD-10-CM | POA: Diagnosis not present

## 2024-01-13 DIAGNOSIS — H5712 Ocular pain, left eye: Secondary | ICD-10-CM | POA: Diagnosis not present

## 2024-01-13 DIAGNOSIS — H04123 Dry eye syndrome of bilateral lacrimal glands: Secondary | ICD-10-CM | POA: Diagnosis not present

## 2024-01-13 DIAGNOSIS — Z961 Presence of intraocular lens: Secondary | ICD-10-CM | POA: Diagnosis not present

## 2024-01-16 ENCOUNTER — Ambulatory Visit: Payer: Self-pay | Admitting: Family Medicine

## 2024-01-27 ENCOUNTER — Ambulatory Visit: Payer: Medicare Other | Admitting: Family Medicine

## 2024-01-31 ENCOUNTER — Ambulatory Visit: Payer: Medicare Other | Admitting: Family Medicine

## 2024-01-31 ENCOUNTER — Encounter: Payer: Self-pay | Admitting: Family Medicine

## 2024-01-31 VITALS — BP 170/81 | HR 82 | Resp 16 | Ht 66.0 in | Wt 300.6 lb

## 2024-01-31 DIAGNOSIS — I10 Essential (primary) hypertension: Secondary | ICD-10-CM

## 2024-01-31 DIAGNOSIS — R052 Subacute cough: Secondary | ICD-10-CM

## 2024-01-31 MED ORDER — AMLODIPINE BESYLATE 2.5 MG PO TABS
2.5000 mg | ORAL_TABLET | Freq: Every day | ORAL | 1 refills | Status: DC
Start: 2024-01-31 — End: 2024-04-16

## 2024-01-31 MED ORDER — HYDROCOD POLI-CHLORPHE POLI ER 10-8 MG/5ML PO SUER: 5.0000 mL | Freq: Every evening | ORAL | 0 refills | Status: DC | PRN

## 2024-01-31 NOTE — Patient Instructions (Signed)
 Marland Kitchen  Please review the attached list of medications and notify my office if there are any errors.   . Please bring all of your medications to every appointment so we can make sure that our medication list is the same as yours.

## 2024-02-06 NOTE — Progress Notes (Signed)
 Established patient visit   Patient: Kaylee Reyes   DOB: 01/22/1951   73 y.o. Female  MRN: [id number] Visit Date: 01/31/2024  Today's healthcare provider: Mila Merry, MD   Chief Complaint  Patient presents with   Medical Management of Chronic Issues    1 month follow-up   Subjective    Discussed the use of AI scribe software for clinical note transcription with the patient, who gave verbal consent to proceed.  History of Present Illness   Kaylee Reyes is a 73 year old female with hypertension who presents with concerns about blood pressure management and a cough.   She reports a recent episode of eye pain, leading to an emergency room visit on 1/15. The pain was located behind her eye, and she was diagnosed with dry eye by an optometrist. She was prescribed anti-inflammatory drops, which she had used previously for cataracts, and her symptoms have improved since then. She has resumed using heat as a treatment. She initially presented with a very high blood pressure of 195/88 which came down to 165/80 by the time she was discharged from the ER. She has a history of hypertension, initially noted during a visit to urgent care. Her current regimen includes valsartan 80 mg in the morning and 80 mg at night, with an additional 40 mg as needed as advised by her cardiologist.  Despite this, her blood pressure remains elevated, prompting consideration of further medication adjustments.  She is also experiencing a persistent cough, which she attributes to sinus drainage. She uses cough drops and chloroseptic spray, which have caused nausea. She recalls a previous prescription for cough syrup but has not used it recently. She has a history of bronchial infection treated with a Z-Pak, which led to a urinary tract infection.    Her medication list includes fexofenadine for sinus or allergy symptoms and Tums for occasional heartburn, as she has stopped taking Prevacid. She has a  history of pancreatitis, which she associates with previous use of HCTZ, and she has not had issues since discontinuing it.       Medications: Outpatient Medications Prior to Visit  Medication Sig Note   B Complex Vitamins (VITAMIN-B COMPLEX PO) Take by mouth.    Calcium Carbonate Antacid (TUMS PO) Take by mouth as needed.    Carboxymeth-Glycerin-Polysorb (REFRESH OPTIVE MEGA-3 OP) Place 1 drop into both eyes as needed.    docusate sodium (COLACE) 100 MG capsule Take 200 mg by mouth daily. 2 in am, 1 at night    fexofenadine (ALLEGRA) 60 MG tablet Take 60 mg by mouth daily.    fluticasone (FLONASE) 50 MCG/ACT nasal spray SHAKE LIQUID AND USE 1 SPRAY IN EACH NOSTRIL DAILY (Patient taking differently: as needed.)    ketoconazole (NIZORAL) 2 % cream Apply topically daily as needed for irritation.    levothyroxine (SYNTHROID) 75 MCG tablet TAKE 1 TABLET DAILY    meclizine (ANTIVERT) 12.5 MG tablet TAKE 1 TABLET(12.5 MG) BY MOUTH THREE TIMES DAILY AS NEEDED FOR DIZZINESS    triamcinolone (KENALOG) 0.025 % cream Apply 1 Application topically 2 (two) times daily. As needed    valsartan (DIOVAN) 80 MG tablet Take 1 tablet (80 mg total) by mouth 2 (two) times daily.    Vitamin D, Ergocalciferol, (DRISDOL) 1.25 MG (50000 UNIT) CAPS capsule TAKE 1 CAPSULE BY MOUTH EVERY 7 DAYS    [DISCONTINUED] lansoprazole (PREVACID) 30 MG capsule TAKE 1 CAPSULE DAILY AT 12 NOON (NEED OFFICE VISIT PRIOR  TO FURTHER REFILLS) 01/31/2024: no longer needed, taking TUMS instead   No facility-administered medications prior to visit.   Review of Systems  Constitutional:  Negative for appetite change, chills, fatigue and fever.  Respiratory:  Negative for chest tightness and shortness of breath.   Cardiovascular:  Negative for chest pain and palpitations.  Gastrointestinal:  Negative for abdominal pain, nausea and vomiting.  Neurological:  Negative for dizziness and weakness.       Objective    BP (!) 170/81 (BP  Location: Left Wrist, Patient Position: Sitting, Cuff Size: Normal)   Pulse 82   Resp 16   Ht 5\' 6"  (1.676 m)   Wt (!) 300 lb 9.6 oz (136.4 kg)   SpO2 98%   BMI 48.52 kg/m   Physical Exam   General: Appearance:    Obese female in no acute distress  Eyes:    PERRL, conjunctiva/corneas clear, EOM's intact       Lungs:     Clear to auscultation bilaterally, respirations unlabored  Heart:    Normal heart rate. Normal rhythm. No murmurs, rubs, or gallops.    MS:   All extremities are intact.    Neurologic:   Awake, alert, oriented x 3. No apparent focal neurological defect.         Assessment & Plan       Hypertension Despite Valsartan 80mg  BID, blood pressure remains elevated. Discussed the need for additional medication to control systolic blood pressure. -Add Amlodipine 2.5mg  daily to current regimen. -Continue Valsartan 80mg  BID. -Check blood pressure regularly and follow up in 1 month.  Dry Eye Recent episode of eye pain attributed to dry eye. Successfully managed with anti-inflammatory eye drops and heat application. -Continue current management as needed.  Chronic Cough Likely secondary to sinus drainage. Current management with cough drops and chloraseptic spray. -Prescribe Tessalon Perles (benzonatate) for symptomatic relief as needed.  Gastroesophageal Reflux Disease (GERD) Previously managed with Prevacid, now controlled with occasional Tums. -Continue current management as needed.  Follow up in 1 month to reassess blood pressure control and overall health status.    Return in about 1 month (around 02/28/2024).      Mila Merry, MD  [facility] [phone] (phone) [phone] (fax)  [facility]

## 2024-02-08 ENCOUNTER — Ambulatory Visit: Payer: Self-pay | Admitting: Family Medicine

## 2024-02-08 MED ORDER — BENZONATATE 100 MG PO CAPS: 100.0000 mg | ORAL_CAPSULE | Freq: Two times a day (BID) | ORAL | 0 refills | Status: DC | PRN

## 2024-02-08 NOTE — Telephone Encounter (Signed)
 Patient advised.

## 2024-02-08 NOTE — Telephone Encounter (Signed)
Tessalon perles prescribed.

## 2024-02-08 NOTE — Addendum Note (Signed)
 Addended by: Bing Neighbors on: 02/08/2024 01:24 PM   Modules accepted: Orders

## 2024-02-08 NOTE — Telephone Encounter (Signed)
 Message from Lou­za S sent at 02/08/2024  9:20 AM EST  Summary: Cough   Copied From CRM [id number]. Reason for Triage: Cough. Patient requesting pills for cough.         Chief Complaint: very frequent cough PND- pt asking for Tessalon Symptoms: clears throat, cough is very frequent, clear PND Frequency: chronic seasonal Pertinent Negatives: Patient denies fever, chest pain, blood in sputum, SOB Disposition: [] ED /[] Urgent Care (no appt availability in office) / [] Appointment(In office/virtual)/ []  [facility]/ [] Home Care/ [] Refused Recommended Disposition /[] [place] [facility]/ [x]  Follow-up with PCP Additional Notes: pt asking for tessalon. Pt stated her daughter gave her tessalon and she stated it has been effective. Pt stated she doesn't want to take the cough syrup prescribed. Discussed saline nasal spray and restarting Flonase daily, 2 tsp honey at bedtime, sucking on lifesavers and humidifier  Reason for Disposition  Cough with cold symptoms (e.g., runny nose, postnasal drip, throat clearing)  Answer Assessment - Initial Assessment Questions 1. ONSET: "When did the cough begin?"      Chronic  2. SEVERITY: "How bad is the cough today?"      Very frequent  3. SPUTUM: "Describe the color of your sputum" (none, dry cough; clear, white, yellow, green)     Yes- clear 4. HEMOPTYSIS: "Are you coughing up any blood?" If so ask: "How much?" (flecks, streaks, tablespoons, etc.)     no 5. DIFFICULTY BREATHING: "Are you having difficulty breathing?" If Yes, ask: "How bad is it?" (e.g., mild, moderate, severe)    - MILD: No SOB at rest, mild SOB with walking, speaks normally in sentences, can lie down, no retractions, pulse < 100.    - MODERATE: SOB at rest, SOB with minimal exertion and prefers to sit, cannot lie down flat, speaks in phrases, mild retractions, audible wheezing, pulse 100-120.    - SEVERE: Very SOB at rest, speaks in single words, struggling to breathe, sitting  hunched forward, retractions, pulse > 120      no 6. FEVER: "Do you have a fever?" If Yes, ask: "What is your temperature, how was it measured, and when did it start?"     no 10. OTHER SYMPTOMS: "Do you have any other symptoms?" (e.g., runny nose, wheezing, chest pain)      Runny nose  Protocols used: Cough - Acute Non-Productive-A-AH

## 2024-02-10 ENCOUNTER — Other Ambulatory Visit: Payer: Self-pay | Admitting: Family Medicine

## 2024-02-10 MED ORDER — BENZONATATE 100 MG PO CAPS: 200.0000 mg | ORAL_CAPSULE | Freq: Three times a day (TID) | ORAL | 0 refills | Status: DC | PRN

## 2024-02-10 NOTE — Telephone Encounter (Signed)
 Called and spoke to the pt, to clarify if she was able to pick up the Rx. She stated she did receive a call from the pharmacy stating it was ready for pick-up. However she notice the medication dose was 100mg  bid, she stated she was on this dose before and it didn't work. She has been using her daughter left over Rx which is 200mg  3 times a day, and its working for her. She is requesting a dose and direction change.  Please advise

## 2024-02-10 NOTE — Telephone Encounter (Signed)
 Dose updated to 200mg 

## 2024-02-10 NOTE — Telephone Encounter (Signed)
 Verbal order completed with the pharmacy 02/09/24.

## 2024-02-10 NOTE — Telephone Encounter (Signed)
 Pharmacy did not receive refill.  Looks as if it was printed instead of escribed.  Please reorder.

## 2024-02-10 NOTE — Addendum Note (Signed)
 Addended by: Judd Gaudier on: 02/10/2024 08:57 AM   Modules accepted: Orders

## 2024-02-18 ENCOUNTER — Emergency Department
Admission: EM | Admit: 2024-02-18 | Discharge: 2024-02-18 | Disposition: A | Discharge status: Home / Self Care | Attending: Emergency Medicine | Admitting: Emergency Medicine

## 2024-02-18 ENCOUNTER — Other Ambulatory Visit: Payer: Self-pay

## 2024-02-18 DIAGNOSIS — R1012 Left upper quadrant pain: Secondary | ICD-10-CM | POA: Diagnosis present

## 2024-02-18 DIAGNOSIS — K2100 Gastro-esophageal reflux disease with esophagitis, without bleeding: Secondary | ICD-10-CM | POA: Diagnosis not present

## 2024-02-18 DIAGNOSIS — I10 Essential (primary) hypertension: Secondary | ICD-10-CM | POA: Diagnosis not present

## 2024-02-18 LAB — CBC
HCT: 35.8 % — ABNORMAL LOW (ref 36.0–46.0)
Hemoglobin: 11.7 g/dL — ABNORMAL LOW (ref 12.0–15.0)
MCH: 28.1 pg (ref 26.0–34.0)
MCHC: 32.7 g/dL (ref 30.0–36.0)
MCV: 85.9 fL (ref 80.0–100.0)
Platelets: 185 10*3/uL (ref 150–400)
RBC: 4.17 MIL/uL (ref 3.87–5.11)
RDW: 14.4 % (ref 11.5–15.5)
WBC: 7 10*3/uL (ref 4.0–10.5)
nRBC: 0 % (ref 0.0–0.2)

## 2024-02-18 LAB — COMPREHENSIVE METABOLIC PANEL
ALT: 39 U/L (ref 0–44)
AST: 38 U/L (ref 15–41)
Albumin: 4.2 g/dL (ref 3.5–5.0)
Alkaline Phosphatase: 82 U/L (ref 38–126)
Anion gap: 9 (ref 5–15)
BUN: 14 mg/dL (ref 8–23)
CO2: 23 mmol/L (ref 22–32)
Calcium: 8.9 mg/dL (ref 8.9–10.3)
Chloride: 103 mmol/L (ref 98–111)
Creatinine, Ser: 0.88 mg/dL (ref 0.44–1.00)
GFR, Estimated: >60 mL/min (ref >60)
Glucose, Bld: 115 mg/dL — ABNORMAL HIGH (ref 70–99)
Potassium: 4.3 mmol/L (ref 3.5–5.1)
Sodium: 135 mmol/L (ref 135–145)
Total Bilirubin: 0.6 mg/dL (ref 0.0–1.2)
Total Protein: 8.6 g/dL — ABNORMAL HIGH (ref 6.5–8.1)

## 2024-02-18 LAB — LIPASE, BLOOD: Lipase: 46 U/L (ref 11–51)

## 2024-02-18 LAB — URINALYSIS, ROUTINE W REFLEX MICROSCOPIC
Bilirubin Urine: NEGATIVE
Glucose, UA: NEGATIVE mg/dL
Hgb urine dipstick: NEGATIVE
Ketones, ur: NEGATIVE mg/dL
Leukocytes,Ua: NEGATIVE
Nitrite: NEGATIVE
Protein, ur: NEGATIVE mg/dL
Specific Gravity, Urine: 1.014 (ref 1.005–1.030)
pH: 6 (ref 5.0–8.0)

## 2024-02-18 MED ORDER — LIDOCAINE VISCOUS HCL 2 % MT SOLN
15.0000 mL | Freq: Once | OROMUCOSAL | Status: AC
  Administered 2024-02-18: 15 mL via ORAL
  Filled 2024-02-18: qty 15

## 2024-02-18 MED ORDER — PANTOPRAZOLE SODIUM 40 MG PO TBEC: 40.0000 mg | DELAYED_RELEASE_TABLET | Freq: Every day | ORAL | 1 refills | Status: DC

## 2024-02-18 MED ORDER — ALUM & MAG HYDROXIDE-SIMETH 200-200-20 MG/5ML PO SUSP
30.0000 mL | Freq: Once | ORAL | Status: AC
  Administered 2024-02-18: 30 mL via ORAL
  Filled 2024-02-18: qty 30

## 2024-02-18 NOTE — ED Triage Notes (Signed)
 Pt c/o LUQ abdominal pain and nausea that started this morning. Pt has a history of GERD and took pantoprazole and meclizine this morning w/out relief. Pt states drinking tea made pain worse.

## 2024-02-18 NOTE — ED Provider Notes (Signed)
 [facility] Provider Note    Event Date/Time   First MD Initiated Contact with Patient 02/18/24 1200     (approximate)   History   Abdominal Pain   HPI  Kaylee Reyes is a 73 y.o. female with a history of GERD, pancreatitis who presents with complaints of left upper quadrant abdominal pain.  She reports that started overnight, feels somewhat similar to when she had pancreatitis in the past.  She does not smoke, does not drink.  She is also concerned it could be acid reflux, she has been taking Protonix intermittently over the last couple of weeks.     Physical Exam   Triage Vital Signs: ED Triage Vitals  Encounter Vitals Group     BP 02/18/24 1152 (!) 177/73     Systolic BP Percentile --      Diastolic BP Percentile --      Pulse Rate 02/18/24 1152 79     Resp 02/18/24 1152 17     Temp 02/18/24 1156 98.7 F (37.1 C)     Temp Source 02/18/24 1156 Oral     SpO2 02/18/24 1152 97 %     Weight --      Height --      Head Circumference --      Peak Flow --      Pain Score 02/18/24 1153 3     Pain Loc --      Pain Education --      Exclude from Growth Chart --     Most recent vital signs: Vitals:   02/18/24 1156 02/18/24 1420  BP:  128/70  Pulse:  79  Resp:  16  Temp: 98.7 F (37.1 C)   SpO2:  99%     General: Awake, no distress.  CV:  Good peripheral perfusion.  Resp:  Normal effort.  Abd:  No distention.  Soft, nontender, reassuring exam Other:     ED Results / Procedures / Treatments   Labs (all labs ordered are listed, but only abnormal results are displayed) Labs Reviewed  COMPREHENSIVE METABOLIC PANEL - Abnormal; Notable for the following components:      Result Value   Glucose, Bld 115 (*)    Total Protein 8.6 (*)    All other components within normal limits  URINALYSIS, ROUTINE W REFLEX MICROSCOPIC - Abnormal; Notable for the following components:   Color, Urine YELLOW (*)    APPearance HAZY (*)    All other  components within normal limits  CBC - Abnormal; Notable for the following components:   Hemoglobin 11.7 (*)    HCT 35.8 (*)    All other components within normal limits  LIPASE, BLOOD     EKG  ED ECG REPORT I, Jene Every, the attending physician, personally viewed and interpreted this ECG.  Date: 02/18/2024  Rhythm: normal sinus rhythm QRS Axis: normal Intervals: normal ST/T Wave abnormalities: normal Narrative Interpretation: no evidence of acute ischemia    RADIOLOGY     PROCEDURES:  Critical Care performed:   Procedures   MEDICATIONS ORDERED IN ED: Medications  alum & mag hydroxide-simeth (MAALOX/MYLANTA) 200-200-20 MG/5ML suspension 30 mL (30 mLs Oral Given 02/18/24 1407)    And  lidocaine (XYLOCAINE) 2 % viscous mouth solution 15 mL (15 mLs Oral Given 02/18/24 1407)     IMPRESSION / MDM / ASSESSMENT AND PLAN / ED COURSE  I reviewed the triage vital signs and the nursing notes. Patient's presentation is most consistent  with acute presentation with potential threat to life or bodily function.  Patient presents with left upper quadrant abdominal pain as detailed above, differential includes pancreatitis, location not consistent with kidney stone pain.  GERD is a possibility/gastritis.  Will obtain labs, trial GI cocktail and reevaluate.  Lipase is normal, doubt pancreatitis  Patient is feeling improved and symptoms have essentially resolved, no indication for imaging at this time.  Will restart the patient on pantoprazole, outpatient follow-up with PCP, she agrees with this plan, no indication for admission      FINAL CLINICAL IMPRESSION(S) / ED DIAGNOSES   Final diagnoses:  Gastroesophageal reflux disease with esophagitis without hemorrhage     Rx / DC Orders   ED Discharge Orders          Ordered    pantoprazole (PROTONIX) 40 MG tablet  Daily        02/18/24 1404             Note:  This document was prepared using Dragon voice  recognition software and may include unintentional dictation errors.   Jene Every, MD 02/18/24 [phone]

## 2024-02-20 ENCOUNTER — Encounter: Payer: Self-pay | Admitting: Family Medicine

## 2024-02-20 ENCOUNTER — Ambulatory Visit (INDEPENDENT_AMBULATORY_CARE_PROVIDER_SITE_OTHER): Admitting: Family Medicine

## 2024-02-20 VITALS — BP 128/70 | HR 84 | Resp 16 | Wt 289.0 lb

## 2024-02-20 DIAGNOSIS — I10 Essential (primary) hypertension: Secondary | ICD-10-CM

## 2024-02-20 DIAGNOSIS — K219 Gastro-esophageal reflux disease without esophagitis: Secondary | ICD-10-CM | POA: Diagnosis not present

## 2024-02-20 DIAGNOSIS — L739 Follicular disorder, unspecified: Secondary | ICD-10-CM | POA: Diagnosis not present

## 2024-02-20 MED ORDER — MUPIROCIN CALCIUM 2 % EX CREA
1.0000 | TOPICAL_CREAM | Freq: Two times a day (BID) | CUTANEOUS | 0 refills | Status: DC
Start: 2024-02-20 — End: 2024-04-16

## 2024-02-20 NOTE — Progress Notes (Signed)
 Established patient visit   Patient: Kaylee Reyes   DOB: Feb 18, 1951   73 y.o. Female  MRN: [id number] Visit Date: 02/20/2024  Today's healthcare provider: Mila Merry, MD   Chief Complaint  Patient presents with   bump on upper eye    Left side.   Subjective    Discussed the use of AI scribe software for clinical note transcription with the patient, who gave verbal consent to proceed.  History of Present Illness   Kaylee Reyes is a 73 year old female who presents with eye discomfort and reflux symptoms.  She experienced a flare-up of reflux symptoms, leading to an ER visit on Saturday. The pain radiated to her back, initially causing concern for pancreatic involvement, but it was determined to be reflux. She resumed taking Protonix, which has provided some improvement. She had been off Protonix since October and attributes the flare-up to dietary choices.  She noticed a new bump on her eye this morning, which is sensitive and concerning due to an upcoming conference. She has a history of a similar issue on her eyelid. Her daughter suggested it might be an ingrown hair, but she has never experienced this before. She uses Refresh eye drops frequently, about four to five times a day, and sometimes uses goggles to maintain moisture in her eyes due to dryness. She also applies moist heat to her eyes.  She has a history of high blood pressure, which is currently well-controlled with amlodipine and Valsartan. Her blood pressure was previously as high as 177, but recent measurements have been very good. She has been following a GERD dietary plan and has lost some weight since her last visit, when she weighed 300 pounds.     Lab Results  Component Value Date   NA 135 02/18/2024   K 4.3 02/18/2024   CREATININE 0.88 02/18/2024   GFRNONAA >60 02/18/2024   GLUCOSE 115 (H) 02/18/2024     Medications: Outpatient Medications Prior to Visit  Medication Sig   amLODipine  (NORVASC) 2.5 MG tablet Take 1 tablet (2.5 mg total) by mouth daily.   B Complex Vitamins (VITAMIN-B COMPLEX PO) Take by mouth.   benzonatate (TESSALON PERLES) 100 MG capsule Take 2 capsules (200 mg total) by mouth 3 (three) times daily as needed for cough.   Calcium Carbonate Antacid (TUMS PO) Take by mouth as needed.   Carboxymeth-Glycerin-Polysorb (REFRESH OPTIVE MEGA-3 OP) Place 1 drop into both eyes as needed.   chlorpheniramine-HYDROcodone (TUSSIONEX) 10-8 MG/5ML Take 5 mLs by mouth at bedtime as needed for cough.   docusate sodium (COLACE) 100 MG capsule Take 200 mg by mouth daily. 2 in am, 1 at night   fexofenadine (ALLEGRA) 60 MG tablet Take 60 mg by mouth daily.   fluticasone (FLONASE) 50 MCG/ACT nasal spray SHAKE LIQUID AND USE 1 SPRAY IN EACH NOSTRIL DAILY (Patient taking differently: as needed.)   ketoconazole (NIZORAL) 2 % cream Apply topically daily as needed for irritation.   levothyroxine (SYNTHROID) 75 MCG tablet TAKE 1 TABLET DAILY   meclizine (ANTIVERT) 12.5 MG tablet TAKE 1 TABLET(12.5 MG) BY MOUTH THREE TIMES DAILY AS NEEDED FOR DIZZINESS   pantoprazole (PROTONIX) 40 MG tablet Take 1 tablet (40 mg total) by mouth daily.   triamcinolone (KENALOG) 0.025 % cream Apply 1 Application topically 2 (two) times daily. As needed   valsartan (DIOVAN) 80 MG tablet Take 1 tablet (80 mg total) by mouth 2 (two) times daily.   Vitamin D,  Ergocalciferol, (DRISDOL) 1.25 MG (50000 UNIT) CAPS capsule TAKE 1 CAPSULE BY MOUTH EVERY 7 DAYS   No facility-administered medications prior to visit.   Review of Systems     Objective    BP 128/70 (BP Location: Left Arm, Patient Position: Sitting, Cuff Size: Large)   Pulse 84   Resp 16   Wt 289 lb (131.1 kg)   SpO2 98%   BMI 46.65 kg/m    Physical Exam   HEENT: Eyelid normal on underside. Lesion on upper eyelid, outside skin, not underside.    No results found for any visits on 02/20/24.  Assessment & Plan       Folliculitis of the  eyelid Acute folliculitis on the upper eyelid. No ingrown hair or deeper infection. Likely inflammation of a hair follicle or sweat gland. - Prescribe Bactroban cream twice daily for one week. - Advise warm compresses to affected area. - Instruct to report if no improvement in 3-4 days.  Gastroesophageal Reflux Disease (GERD) Recent exacerbation of GERD symptoms, previously well-controlled off medication. Flare-up likely due to dietary indiscretion. Protonix restarted to manage symptoms. - Continue Protonix as prescribed. - Advise dietary modifications.  Hypertension Previously uncontrolled hypertension now well-managed with Valsartan and lifestyle modifications. Recent blood pressure readings improved. - Continue Valsartan and amlodipine  - Schedule follow-up in 4-5 months.    Return for reschedule 02-28-24 appt out to mid July 2025, Hypertension.      Mila Merry, MD  [facility] [phone] (phone) [phone] (fax)  [facility]

## 2024-02-20 NOTE — Patient Instructions (Signed)
 Marland Kitchen  Please review the attached list of medications and notify my office if there are any errors.   . Please bring all of your medications to every appointment so we can make sure that our medication list is the same as yours.

## 2024-02-23 ENCOUNTER — Encounter: Payer: Self-pay | Admitting: Internal Medicine

## 2024-02-23 ENCOUNTER — Ambulatory Visit: Payer: Self-pay

## 2024-02-23 ENCOUNTER — Ambulatory Visit (INDEPENDENT_AMBULATORY_CARE_PROVIDER_SITE_OTHER): Admitting: Internal Medicine

## 2024-02-23 VITALS — BP 160/60 | Ht 66.0 in | Wt 290.6 lb

## 2024-02-23 DIAGNOSIS — R600 Localized edema: Secondary | ICD-10-CM

## 2024-02-23 NOTE — Patient Instructions (Signed)
Salivary Stone  A salivary stone is a small cluster of mineral (mineral deposit) that builds up in the tubes (ducts) that drain the salivary glands. Most salivary stones are made of calcium. When a stone forms, saliva can back up into the gland and cause painful swelling. Your salivary glands are the glands that make saliva. You have six major salivary glands. Each gland has a duct that carries saliva into your mouth. Saliva keeps your mouth moist and breaks down the food that you eat. It also helps prevent tooth decay. Two salivary glands are found just in front of your ears (parotid). The ducts for these glands open up inside your cheeks, near your back teeth. You also have two glands under your tongue (sublingual) and two glands under your jaw (submandibular). The ducts for these glands open under your tongue. A stone can form in any salivary gland. The most common place for a salivary stone to form is in a submandibular salivary gland. What are the causes? Salivary stones may be caused by any condition that lessens the flow of saliva. It is not known why some people get stones. What increases the risk? You are more likely to develop this condition if: You do not drink enough water. You smoke. You have any of these: High blood pressure. Gout. Diabetes. What are the signs or symptoms? The main sign of a salivary stone is sudden swelling of a salivary gland during eating. This usually happens under the jaw on one side. Other signs and symptoms may include: Swelling of the cheek or under the tongue during eating. Pain in the swollen area. Trouble chewing or swallowing. Swelling that goes down after eating. Sometimes, the salivary stone may be seen. The stone is oval in shape and may be white or yellow in color. How is this diagnosed? This condition may be diagnosed based on: Your signs and symptoms. A physical exam. In many cases, your health care provider will be able to feel the stone in a  duct inside your mouth. Imaging studies, such as: X-rays. Ultrasound. CT scan. MRI. You may need to see an ear, nose, and throat specialist (ENT or otolaryngologist) for diagnosis and treatment. How is this treated? Treatment for this condition depends on the size of the stone. A small stone that is not causing symptoms may be treated with home care. A stone that is large enough to cause symptoms may be treated by: Probing and widening of the duct to let the stone pass. Putting a thin, flexible scope (endoscope) into the duct to find and remove the stone. Breaking up the stone with sound waves. Removing the whole salivary gland. Follow these instructions at home: To relieve discomfort Take NSAIDs, such as ibuprofen, to help relieve pain and swelling as told by your health care provider. Follow these instructions every few hours: Suck on a lemon candy or a vitamin C lozenge to prompt the flow of saliva. Put a warm, damp cloth (warmcompress) over the gland. Gently massage the gland. General instructions  Take over-the-counter and prescription medicines only as told by your health care provider. Drink enough fluid to keep your urine pale yellow. Do not use any products that contain nicotine or tobacco. These products include cigarettes, chewing tobacco, and vaping devices, such as e-cigarettes. If you need help quitting, ask your health care provider. Keep all follow-up visits. This is important. Contact a health care provider if: You have pain and swelling in your face, jaw, or mouth after eating. You  keep having swelling in any of these places: In front of your ear. Under your jaw. Inside your mouth. Get help right away if: You have pain and swelling in your face, jaw, or mouth, that suddenly gets worse. Your pain and swelling make it hard to swallow, talk, or breathe. These symptoms may be an emergency. Get help right away. Call 911. Do not wait to see if the symptoms will go  away. Do not drive yourself to the hospital. Summary A salivary stone is a small clump of mineral (mineral deposit) that builds up in the ducts that drain your salivary glands. When a stone forms, saliva can back up into the gland and cause painful swelling. Salivary stones may be caused by any condition that lessens the flow of saliva. Treatment for this condition depends on the size of the stone. This information is not intended to replace advice given to you by your health care provider. Make sure you discuss any questions you have with your health care provider. Document Revised: 11/18/2021 Document Reviewed: 11/18/2021 Elsevier Patient Education  2024 [employer].

## 2024-02-23 NOTE — Progress Notes (Signed)
 Subjective:    Patient ID: Kaylee Reyes, female    DOB: [birth year], 73 y.o.   MRN: [id number]  HPI  Discussed the use of AI scribe software for clinical note transcription with the patient, who gave verbal consent to proceed.   Kaylee Reyes is a 73 year old female who presents with recurrent swelling inferior to the right ear.  She experiences recurrent swelling inferior to her right ear, first noticed yesterday during dinner. The swelling appeared suddenly as a 'big old knot' and resolved within 30 minutes. At that time, she had grilled chicken, cottage cheese with cucumbers and ranch. It recurred earlier today after consuming greek vanilla yogurt with sliced banana, resolving shortly after.  She reports this has occurred 3 times with eating since it initially began.  She has never had symptoms like this before.  No associated symptoms such as headache, runny nose, nasal congestion, ear pain, sore throat, or swelling of the lips, tongue, or throat.  She denies dental pain and reports she barely has any teeth on that side of her mouth.  She went to the ER for evaluation but left because the symptoms have resolved.  She reports chronic sinus issues related to allergies.   Review of Systems   Past Medical History:  Diagnosis Date   Allergy    Notes in my chart   Anemia    Arthritis 2016   Noted from Mri and xrays   Body tinea 10/01/2015   Previously treated by Dr. Orson Aloe    Cancer [facility]) 09/2015   Had full mascetomy   COVID-19 02/23/2021   MAB infusion 01/23/2021   GERD (gastroesophageal reflux disease)    History of breast cancer 09/2015   breast cancer   History of chicken pox    History of peptic ulcer disease 09/25/2015   Hyperlipidemia    Hypertension    Hypothyroidism    Neuropathy of foot    bilateral   Pinched nerve    PONV (postoperative nausea and vomiting)    exploratory surgery at [facility]   Thyroid disease    Tuberculosis exposure    Mother had TB.  Pt  shows (+) on tests, but has never had.   Vertigo    last episode over 1 yr ago   Wears dentures    partial upper and lower    Current Outpatient Medications  Medication Sig Dispense Refill   amLODipine (NORVASC) 2.5 MG tablet Take 1 tablet (2.5 mg total) by mouth daily. 90 tablet 1   B Complex Vitamins (VITAMIN-B COMPLEX PO) Take by mouth.     benzonatate (TESSALON PERLES) 100 MG capsule Take 2 capsules (200 mg total) by mouth 3 (three) times daily as needed for cough. 60 capsule 0   Calcium Carbonate Antacid (TUMS PO) Take by mouth as needed.     Carboxymeth-Glycerin-Polysorb (REFRESH OPTIVE MEGA-3 OP) Place 1 drop into both eyes as needed.     chlorpheniramine-HYDROcodone (TUSSIONEX) 10-8 MG/5ML Take 5 mLs by mouth at bedtime as needed for cough. 70 mL 0   docusate sodium (COLACE) 100 MG capsule Take 200 mg by mouth daily. 2 in am, 1 at night     fexofenadine (ALLEGRA) 60 MG tablet Take 60 mg by mouth daily.     fluticasone (FLONASE) 50 MCG/ACT nasal spray SHAKE LIQUID AND USE 1 SPRAY IN EACH NOSTRIL DAILY (Patient taking differently: as needed.) 16 g 1   ketoconazole (NIZORAL) 2 % cream Apply topically daily as needed for  irritation.     levothyroxine (SYNTHROID) 75 MCG tablet TAKE 1 TABLET DAILY 90 tablet 3   meclizine (ANTIVERT) 12.5 MG tablet TAKE 1 TABLET(12.5 MG) BY MOUTH THREE TIMES DAILY AS NEEDED FOR DIZZINESS 90 tablet 3   mupirocin cream (BACTROBAN) 2 % Apply 1 Application topically 2 (two) times daily. 15 g 0   pantoprazole (PROTONIX) 40 MG tablet Take 1 tablet (40 mg total) by mouth daily. 30 tablet 1   triamcinolone (KENALOG) 0.025 % cream Apply 1 Application topically 2 (two) times daily. As needed     valsartan (DIOVAN) 80 MG tablet Take 1 tablet (80 mg total) by mouth 2 (two) times daily. 180 tablet 0   Vitamin D, Ergocalciferol, (DRISDOL) 1.25 MG (50000 UNIT) CAPS capsule TAKE 1 CAPSULE BY MOUTH EVERY 7 DAYS 12 capsule 1   No current facility-administered medications  for this visit.    Allergies  Allergen Reactions   Penicillins Shortness Of Breath    Has patient had a PCN reaction causing immediate rash, facial/tongue/throat swelling, SOB or lightheadedness with hypotension: Yes Has patient had a PCN reaction causing severe rash involving mucus membranes or skin necrosis: No Has patient had a PCN reaction that required hospitalization No Has patient had a PCN reaction occurring within the last 10 years: No If all of the above answers are "NO", then may proceed with Cephalosporin use.    Nickel Hives   Cardura [Doxazosin] Other (See Comments)    Heart pounding/ "beating fast"   Keflex [Cephalexin] Swelling    Throat swelling   Pravastatin Sodium Other (See Comments)    Myalgia     Family History  Problem Relation Age of Onset   Hypertension Mother    Thyroid disease Mother    Hypertension Brother    Diabetes Brother    Breast cancer Neg Hx    Ovarian cancer Neg Hx    Colon cancer Neg Hx     Social History   Socioeconomic History   Marital status: Married    Spouse name: Not on file   Number of children: 2   Years of education: Not on file   Highest education level: Some college, no degree  Occupational History   Occupation: retired  Tobacco Use   Smoking status: Never   Smokeless tobacco: Never  Vaping Use   Vaping status: Never Used  Substance and Sexual Activity   Alcohol use: Never   Drug use: Never   Sexual activity: Not Currently    Birth control/protection: Post-menopausal  Other Topics Concern   Not on file  Social History Narrative   Not on file   Social Drivers of Health   Financial Resource Strain: Low Risk  (03/16/2023)   Overall Financial Resource Strain (CARDIA)    Difficulty of Paying Living Expenses: Not hard at all  Food Insecurity: No Food Insecurity (03/16/2023)   Hunger Vital Sign    Worried About Running Out of Food in the Last Year: Never true    Ran Out of Food in the Last Year: Never true   Transportation Needs: No Transportation Needs (03/16/2023)   PRAPARE - Administrator, Civil Service (Medical): No    Lack of Transportation (Non-Medical): No  Physical Activity: Insufficiently Active (03/16/2023)   Exercise Vital Sign    Days of Exercise per Week: 2 days    Minutes of Exercise per Session: 10 min  Stress: No Stress Concern Present (03/16/2023)   [employer] of Occupational Health - Occupational  Stress Questionnaire    Feeling of Stress : Not at all  Social Connections: Socially Integrated (03/16/2023)   Social Connection and Isolation Panel [NHANES]    Frequency of Communication with Friends and Family: More than three times a week    Frequency of Social Gatherings with Friends and Family: Three times a week    Attends Religious Services: More than 4 times per year    Active Member of Clubs or Organizations: Yes    Attends Banker Meetings: More than 4 times per year    Marital Status: Married  Catering manager Violence: Not At Risk (09/19/2023)   Humiliation, Afraid, Rape, and Kick questionnaire    Fear of Current or Ex-Partner: No    Emotionally Abused: No    Physically Abused: No    Sexually Abused: No     Constitutional: Denies fever, malaise, fatigue, headache or abrupt weight changes.  HEENT: Pt reports nasal congestion, facial swelling. Denies eye pain, eye redness, ear pain, ringing in the ears, wax buildup, runny nose, bloody nose, or sore throat. Respiratory: Denies difficulty breathing, shortness of breath, cough or sputum production.   Cardiovascular: Denies chest pain, chest tightness, palpitations or swelling in the hands or feet.  Skin: Denies redness, rashes, lesions or ulcercations.    No other specific complaints in a complete review of systems (except as listed in HPI above).      Objective:   Physical Exam BP (!) 160/60   Ht 5\' 6"  (1.676 m)   Wt 290 lb 9.6 oz (131.8 kg)   BMI 46.90 kg/m   Wt Readings from  Last 3 Encounters:  02/20/24 289 lb (131.1 kg)  01/31/24 (!) 300 lb 9.6 oz (136.4 kg)  12/30/23 289 lb (131.1 kg)    General: Appears her stated age, obese, in NAD. Skin:  Warm, dry and intact. No redness or warmth noted in the area that she reported was swollen. HEENT: Head: normal shape and size; Eyes: sclera white, no icterus, conjunctiva pink, PERRLA and EOMs intact; Throat/Mouth: Teeth missing, mucosa pink and moist, no exudate, lesions or ulcerations noted.  When I palpate the right parotid gland, she reports this is the area that was swollen yesterday however there is no swelling or tenderness in the area now. Neck: No adenopathy noted. Cardiovascular: Normal rate Pulmonary/Chest: Normal effort. Neurological: Alert and oriented.   BMET    Component Value Date/Time   NA 135 02/18/2024 1156   NA 140 06/15/2022 1448   NA 136 07/15/2013 1057   K 4.3 02/18/2024 1156   K 4.1 07/15/2013 1057   CL 103 02/18/2024 1156   CL 102 07/15/2013 1057   CO2 23 02/18/2024 1156   CO2 27 07/15/2013 1057   GLUCOSE 115 (H) 02/18/2024 1156   GLUCOSE 112 (H) 07/15/2013 1057   BUN 14 02/18/2024 1156   BUN 7 (L) 06/15/2022 1448   BUN 10 07/15/2013 1057   CREATININE 0.88 02/18/2024 1156   CREATININE 0.98 07/15/2013 1057   CALCIUM 8.9 02/18/2024 1156   CALCIUM 8.2 (L) 07/15/2013 1057   GFRNONAA >60 02/18/2024 1156   GFRNONAA >60 07/15/2013 1057   GFRAA 85 09/12/2020 1339   GFRAA >60 07/15/2013 1057    Lipid Panel     Component Value Date/Time   CHOL 205 (H) 06/26/2022 0705   CHOL 266 (H) 09/17/2021 0826   TRIG 124 06/26/2022 0705   HDL 26 (L) 06/26/2022 0705   HDL 41 09/17/2021 0826   CHOLHDL 7.9 06/26/2022  0705   VLDL 25 06/26/2022 0705   LDLCALC 154 (H) 06/26/2022 0705   LDLCALC 178 (H) 09/17/2021 0826    CBC    Component Value Date/Time   WBC 7.0 02/18/2024 1225   RBC 4.17 02/18/2024 1225   HGB 11.7 (L) 02/18/2024 1225   HGB 11.4 09/17/2021 0826   HCT 35.8 (L) 02/18/2024  1225   HCT 36.0 09/17/2021 0826   PLT 185 02/18/2024 1225   PLT 210 09/17/2021 0826   MCV 85.9 02/18/2024 1225   MCV 89 09/17/2021 0826   MCV 85 07/15/2013 1057   MCH 28.1 02/18/2024 1225   MCHC 32.7 02/18/2024 1225   RDW 14.4 02/18/2024 1225   RDW 14.0 09/17/2021 0826   RDW 14.3 07/15/2013 1057   LYMPHSABS 1.7 12/21/2023 1655   LYMPHSABS 1.4 09/12/2020 1339   LYMPHSABS 1.6 07/15/2013 1057   MONOABS 0.4 12/21/2023 1655   MONOABS 0.7 07/15/2013 1057   EOSABS 0.2 12/21/2023 1655   EOSABS 0.1 09/12/2020 1339   EOSABS 0.1 07/15/2013 1057   BASOSABS 0.0 12/21/2023 1655   BASOSABS 0.0 09/12/2020 1339   BASOSABS 0.0 07/15/2013 1057    Hgb A1C No results found for: "HGBA1C"          Assessment & Plan:  Assessment and Plan    Right parotid swelling Intermittent parotid swelling during meals suggests a salivary gland stone. No infection signs. Symptoms resolve spontaneously. Differential includes stone or small stones expelled unnoticed. Not treatable unless duct blocked or infected. - Advise massaging area to facilitate stone movement. - Recommend rinsing mouth with salt water after meals. - Instruct to monitor for infection signs and seek urgent care if present. - Suggest follow-up with Doctor Sherrie Mustache if symptoms persist for potential ENT referral.   Follow-up with your PCP as previously scheduled Nicki Reaper, NP

## 2024-02-23 NOTE — Telephone Encounter (Signed)
  Chief Complaint: lump Symptoms: painful lump Frequency: intermittent Pertinent Negatives: Patient denies difficulty swallowing Disposition: [] ED /[] Urgent Care (no appt availability in office) / [x] Appointment(In office/virtual)/ []  [employer] [facility]/ [] Home Care/ [] Refused Recommended Disposition /[] [facility]/ []  Follow-up with PCP Additional Notes:   While eating dinner last night she felt a sensation and then noted a lump under/behind her right ear. She was concerned and drove to the ER but did not go in because the swelling went down by the time she arrived. It flared again after eating eating a bedtime snack but not as severe,  she used a warm compress with effect. This morning after eating her breakfast the lump flared again, she noted it is very tender to touch, and feels her right ear is getting plugged. Lump flares to the size of a silver dollar, she has trouble visualizing the area but on phone camera she notes it does not appear red. She is afebrile. She has not had this issue in the past. Acute visit scheduled with another provider and location due to no acute appointments available at PCP practice location. Educated on care advice as documented in protocol, patient verbalized understanding. Discussed reasons to call back.    Copied from CRM [id number]. Topic: Clinical - Red Word Triage >> Feb 23, 2024 10:09 AM Gaetano Hawthorne wrote: [facility] that prompted transfer to Nurse Triage: Yesterday evening, gland/lymphoid under her right ear began to appear to be swollen - got better but then, after eating some sorbet, it flared up again. Patient states that it is sensitive to touch and some pain/discomfort. Reason for Disposition  [1] Swelling is painful to touch AND [2] no fever  Protocols used: Skin Lump or Localized Swelling-A-AH

## 2024-02-24 ENCOUNTER — Telehealth: Payer: Self-pay | Admitting: Family Medicine

## 2024-02-24 NOTE — Telephone Encounter (Signed)
 Copied from CRM [id number]. Topic: Referral - Request for Referral >> Feb 24, 2024  9:38 AM Shon Hale wrote: Did the patient discuss referral with their provider in the last year? No (If No - schedule appointment) (If Yes - send message)  Appointment offered? Yes, patient would like referral sent as she has seen Nicki Reaper, NP at [facility] for lump in throat.   Type of order/referral and detailed reason for visit: Lump in throat - ENT  Preference of office, provider, location: [place]  If referral order, have you been seen by this specialty before? No (If Yes, this issue or another issue? When? Where?  Can we respond through MyChart? No, patient would prefer a call, [phone]

## 2024-02-28 ENCOUNTER — Ambulatory Visit: Payer: Medicare Other | Admitting: Family Medicine

## 2024-03-12 ENCOUNTER — Ambulatory Visit: Payer: Self-pay

## 2024-03-12 ENCOUNTER — Encounter: Payer: Self-pay | Admitting: Physician Assistant

## 2024-03-12 ENCOUNTER — Ambulatory Visit: Admitting: Physician Assistant

## 2024-03-12 VITALS — BP 164/71 | HR 83 | Resp 16 | Ht 66.0 in | Wt 300.2 lb

## 2024-03-12 DIAGNOSIS — I10 Essential (primary) hypertension: Secondary | ICD-10-CM | POA: Diagnosis not present

## 2024-03-12 DIAGNOSIS — M51369 Other intervertebral disc degeneration, lumbar region without mention of lumbar back pain or lower extremity pain: Secondary | ICD-10-CM

## 2024-03-12 DIAGNOSIS — R600 Localized edema: Secondary | ICD-10-CM | POA: Diagnosis not present

## 2024-03-12 NOTE — Progress Notes (Signed)
 Established patient visit  Patient: Kaylee Reyes   DOB: 11-24-1951   73 y.o. Female  MRN: [id number] Visit Date: 03/12/2024  Today's healthcare provider: Debera Lat, PA-C   Chief Complaint  Patient presents with   Joint Swelling    Bilateral ankle swelling, medication quesition. Pt has not taken BP meds in 2 days.   Subjective      Discussed the use of AI scribe software for clinical note transcription with the patient, who gave verbal consent to proceed.  History of Present Illness The patient, with a history of hypertension and pancreatitis, presents with ankle swelling. She reports that the swelling started after the addition of amlodipine 2.5mg  to her hypertension regimen, which also includes valsartan 160mg . She has been on valsartan for a significant period and recently had the dose increased. The patient stopped taking amlodipine a few days ago and notes a slight decrease in the swelling. She also mentions a history of pancreatitis, which she attributes to long-term use of HCTZ, a diuretic she no longer takes. The patient sleeps in a recliner due to back issues and has noticed a forward lean in her walk, which she believes may be due to degenerative disc disease.       02/23/2024    1:24 PM 12/15/2023   10:29 AM 09/19/2023    8:30 AM  Depression screen PHQ 2/9  Decreased Interest 0 0 0  Down, Depressed, Hopeless 0 0 0  PHQ - 2 Score 0 0 0  Altered sleeping  0   Tired, decreased energy  0   Change in appetite  0   Feeling bad or failure about yourself   0   Trouble concentrating  0   Moving slowly or fidgety/restless  0   Suicidal thoughts  0   PHQ-9 Score  0   Difficult doing work/chores  Not difficult at all       02/23/2024    1:24 PM 12/15/2023   10:30 AM  GAD 7 : Generalized Anxiety Score  Nervous, Anxious, on Edge 0 0  Control/stop worrying 0 0  Worry too much - different things 0 0  Trouble relaxing 0 0  Restless 0 0  Easily annoyed or irritable 0 0   Afraid - awful might happen 0 0  Total GAD 7 Score 0 0  Anxiety Difficulty Not difficult at all Not difficult at all    Medications: Outpatient Medications Prior to Visit  Medication Sig   amLODipine (NORVASC) 2.5 MG tablet Take 1 tablet (2.5 mg total) by mouth daily.   B Complex Vitamins (VITAMIN-B COMPLEX PO) Take by mouth.   benzonatate (TESSALON PERLES) 100 MG capsule Take 2 capsules (200 mg total) by mouth 3 (three) times daily as needed for cough.   Calcium Carbonate Antacid (TUMS PO) Take by mouth as needed.   Carboxymeth-Glycerin-Polysorb (REFRESH OPTIVE MEGA-3 OP) Place 1 drop into both eyes as needed.   chlorpheniramine-HYDROcodone (TUSSIONEX) 10-8 MG/5ML Take 5 mLs by mouth at bedtime as needed for cough.   docusate sodium (COLACE) 100 MG capsule Take 200 mg by mouth daily. 2 in am, 1 at night   fexofenadine (ALLEGRA) 60 MG tablet Take 60 mg by mouth daily.   fluticasone (FLONASE) 50 MCG/ACT nasal spray SHAKE LIQUID AND USE 1 SPRAY IN EACH NOSTRIL DAILY (Patient taking differently: as needed.)   ketoconazole (NIZORAL) 2 % cream Apply topically daily as needed for irritation.   levothyroxine (SYNTHROID) 75 MCG tablet TAKE 1 TABLET DAILY  meclizine (ANTIVERT) 12.5 MG tablet TAKE 1 TABLET(12.5 MG) BY MOUTH THREE TIMES DAILY AS NEEDED FOR DIZZINESS   mupirocin cream (BACTROBAN) 2 % Apply 1 Application topically 2 (two) times daily.   pantoprazole (PROTONIX) 40 MG tablet Take 1 tablet (40 mg total) by mouth daily.   triamcinolone (KENALOG) 0.025 % cream Apply 1 Application topically 2 (two) times daily. As needed   valsartan (DIOVAN) 80 MG tablet Take 1 tablet (80 mg total) by mouth 2 (two) times daily.   Vitamin D, Ergocalciferol, (DRISDOL) 1.25 MG (50000 UNIT) CAPS capsule TAKE 1 CAPSULE BY MOUTH EVERY 7 DAYS   No facility-administered medications prior to visit.    Review of Systems All negative Except see HPI       Objective    BP (!) 164/71 (BP Location: Left Arm,  Patient Position: Sitting, Cuff Size: Normal)   Pulse 83   Resp 16   Ht 5\' 6"  (1.676 m)   Wt (!) 300 lb 3.2 oz (136.2 kg)   SpO2 98%   BMI 48.45 kg/m     Physical Exam Vitals reviewed.  Constitutional:      General: She is not in acute distress.    Appearance: Normal appearance. She is well-developed. She is not diaphoretic.  HENT:     Head: Normocephalic and atraumatic.  Eyes:     General: No scleral icterus.    Conjunctiva/sclera: Conjunctivae normal.  Neck:     Thyroid: No thyromegaly.  Cardiovascular:     Rate and Rhythm: Normal rate and regular rhythm.     Pulses: Normal pulses.     Heart sounds: Normal heart sounds. No murmur heard. Pulmonary:     Effort: Pulmonary effort is normal. No respiratory distress.     Breath sounds: Normal breath sounds. No wheezing, rhonchi or rales.  Musculoskeletal:     Cervical back: Neck supple.     Right lower leg: No edema.     Left lower leg: No edema.  Lymphadenopathy:     Cervical: No cervical adenopathy.  Skin:    General: Skin is warm and dry.     Findings: No rash.  Neurological:     Mental Status: She is alert and oriented to person, place, and time. Mental status is at baseline.  Psychiatric:        Mood and Affect: Mood normal.        Behavior: Behavior normal.      No results found for any visits on 03/12/24.      Assessment & Plan Hypertension chronic Hypertension suboptimally controlled due to amlodipine discontinuation. Blood pressure elevated. Valsartan dose increased. Emphasized home monitoring and low-salt diet. - Increase valsartan by 40 mg as needed, up to 320 mg. - Monitor blood pressure at home twice daily. Bring records to the next appointment - Adhere to a low-salt diet. - Follow-up with Dr. Sherrie Mustache in two weeks.  Peripheral Edema Edema improved after stopping amlodipine.  Diuretics avoided due to pancreatitis history. Compression stockings recommended. - Use compression stockings or tape. -  Consider compression stockings with a zipper. - Avoid diuretics due to pancreatitis history.  Degenerative Disc Disease Degenerative disc disease limits ability to lie flat and causes forward leaning, affecting mobility. Back pain can lead to leg swelling due to nerve nerve root compression. Sleeping in a recliner can exacerbate leg swelling by keeping the legs in a depended position. Continue current regimen Will follow-up  General Health Maintenance Lifestyle modifications emphasized for hypertension and edema management. -  Adhere to a low-salt diet. - Increase water intake to 8-12 glasses per day, including non-sweetened beverages and soups.    No orders of the defined types were placed in this encounter.   No follow-ups on file.   The patient was advised to call back or seek an in-person evaluation if the symptoms worsen or if the condition fails to improve as anticipated.  I discussed the assessment and treatment plan with the patient. The patient was provided an opportunity to ask questions and all were answered. The patient agreed with the plan and demonstrated an understanding of the instructions.  I, Debera Lat, PA-C have reviewed all documentation for this visit. The documentation on 03/12/2024  for the exam, diagnosis, procedures, and orders are all accurate and complete.  Debera Lat, [facility], MMS [facility] [phone] (phone) [phone] (fax)  [facility]

## 2024-03-12 NOTE — Telephone Encounter (Signed)
 Chief Complaint: ankle swelling Symptoms: bilateral ankle and foot swelling, SOB at rest Frequency: 1 wk Pertinent Negatives: Patient denies CP, fever, redness, skin color change, numbness tingling worse than baseline neuropathy Disposition: [] ED /[] Urgent Care (no appt availability in office) / [x] Appointment(In office/virtual)/ []  [facility]/ [] Home Care/ [] Refused Recommended Disposition /[] [facility]/ []  Follow-up with PCP Additional Notes: Pt reports bilateral ankle and foot swelling worse on the R for 1 wk. Pt taking amlodipine and her PCP spoke with her that this is a potential side effect so he put her on the lowest dose. Pt wondering if swelling is related to amlodipine. Pt denies redness or pain. Denies swelling or pain in the leg or calf. Denies CP. Pt does endorse "slight" SOB this AM that she noticed when she went to the post office today. RN advised pt she should be seen today within 4 hours, pt agreeable to that timeframe and to be seen by a different provider since her PCP had no availability in that timeframe. RN scheduled pt for today at 1100. RN advised pt if she develops CP or SOB at rest she needs to call 911, pt verbalized understanding.   Copied from CRM [id number]. Topic: Clinical - Red Word Triage >> Mar 12, 2024 10:04 AM Everette C wrote: [facility] that prompted transfer to Nurse Triage: The patient has experienced significant swelling in their ankles, predominantly their right  as well as some nausea that they believe may be related to their prescription of amLODipine (NORVASC) 2.5 MG tablet [[id number]] Reason for Disposition  [1] Thigh, calf, or ankle swelling AND [2] bilateral AND [3] 1 side is more swollen  Answer Assessment - Initial Assessment Questions 1. LOCATION: "Which ankle is swollen?" "Where is the swelling?"     Bilateral, worse on the R 2. ONSET: "When did the swelling start?"     "Been going on for a good week ,I am not sure how long I  have been taking the medicine but one week after I noticed swelling but now my feet are puffy and it is hard to put a shoe on" (amlodipine) 3. SWELLING: "How bad is the swelling?" Or, "How large is it?" (e.g., mild, moderate, severe; size of localized swelling)    - NONE: No joint swelling.   - LOCALIZED: Localized; small area of puffy or swollen skin (e.g., insect bite, skin irritation).   - MILD: Joint looks or feels mildly swollen or puffy.   - MODERATE: Swollen; interferes with normal activities (e.g., work or school); decreased range of movement; may be limping.   - SEVERE: Very swollen; can't move swollen joint at all; limping a lot or unable to walk.     Ankles and feet - R is worse. "Ankle on my R side has always been swollen but it is more swollen into the feet" 4. PAIN: "Is there any pain?" If Yes, ask: "How bad is it?" (Scale 1-10; or mild, moderate, severe)   - NONE (0): no pain.   - MILD (1-3): doesn't interfere with normal activities.    - MODERATE (4-7): interferes with normal activities (e.g., work or school) or awakens from sleep, limping.    - SEVERE (8-10): excruciating pain, unable to do any normal activities, unable to walk.      No "just puffy" 5. CAUSE: "What do you think caused the ankle swelling?"     Amlodipine 6. OTHER SYMPTOMS: "Do you have any other symptoms?" (e.g., fever, chest pain, difficulty breathing,  calf pain) No CP. No redness. No skin color change. No pain. Endorses some SOB with doing errands this AM, none at rest.  Protocols used: Ankle Swelling-A-AH

## 2024-03-16 ENCOUNTER — Other Ambulatory Visit: Payer: Self-pay | Admitting: Oncology

## 2024-03-19 ENCOUNTER — Telehealth: Payer: Self-pay

## 2024-03-19 ENCOUNTER — Other Ambulatory Visit: Payer: Self-pay | Admitting: Physician Assistant

## 2024-03-19 ENCOUNTER — Other Ambulatory Visit: Payer: Self-pay | Admitting: Family Medicine

## 2024-03-19 DIAGNOSIS — I10 Essential (primary) hypertension: Secondary | ICD-10-CM

## 2024-03-19 MED ORDER — VALSARTAN 40 MG PO TABS
40.0000 mg | ORAL_TABLET | Freq: Every day | ORAL | 0 refills | Status: DC
Start: 2024-03-19 — End: 2024-04-05

## 2024-03-19 MED ORDER — VALSARTAN 80 MG PO TABS: 80.0000 mg | ORAL_TABLET | Freq: Two times a day (BID) | ORAL | 0 refills | Status: DC

## 2024-03-19 NOTE — Telephone Encounter (Signed)
 Copied from CRM [id number]. Topic: Clinical - Medication Question >> Mar 19, 2024  8:39 AM Donald Frost wrote: Reason for CRM: The patient called in requesting a refill on her valsartan (DIOVAN) 80 MG tablet but she states she would also like a prescription for valsartan (DIOVAN) 40 MG tablet as she states during her last visit with Janna Ostwalt on 4/07 she was instructed to increase her valsartan to 40 mg or up to 320 mg daily. Please assist patient further by sending in a new script for the 40 mg tablets to   [facility][id number] [place], [place] - [address] AT [facility] OF [address] [facility]  Phone: [phone] Fax: [phone] >> Mar 19, 2024  8:51 AM Donald Frost wrote: The patient also wants her provider to know Janna Ostwalt took her off the amLODipine (NORVASC) 2.5 MG tablet as it was causing her to swell. She states she doesn't want to take the full 320 mg she can take just the additional 40 mg tablets as the 2 80 mg tablets daily isn't quite helping enough

## 2024-03-19 NOTE — Telephone Encounter (Unsigned)
 Copied from CRM [id number]. Topic: Clinical - Medication Refill >> Mar 19, 2024  8:34 AM Kaylee Reyes wrote: Most Recent Primary Care Visit:  Provider: OSTWALT, JANNA  Department: [facility]  Visit Type: ACUTE  Date: 03/12/2024  Medication: valsartan (DIOVAN) 80 MG tablet  Has the patient contacted their pharmacy? Yes   Is this the correct pharmacy for this prescription? Yes If no, delete pharmacy and type the correct one.  This is the patient's preferred pharmacy:  [facility][id number] - [place], [place] - [address] AT [facility] OF [address] [place] [address] [place] [address] Phone: [phone] Fax: [phone]     Has the prescription been filled recently? No  Is the patient out of the medication? No she has a few days left  Has the patient been seen for an appointment in the last year OR does the patient have an upcoming appointment? Yes  Can we respond through MyChart? No if possible she would prefer a call or text  Please assist patient further

## 2024-03-20 ENCOUNTER — Other Ambulatory Visit: Payer: Self-pay

## 2024-03-20 ENCOUNTER — Telehealth: Payer: Self-pay | Admitting: Family Medicine

## 2024-03-20 ENCOUNTER — Ambulatory Visit
Admission: RE | Admit: 2024-03-20 | Discharge: 2024-03-20 | Disposition: A | Discharge status: Home / Self Care | Source: Ambulatory Visit | Attending: Emergency Medicine | Admitting: Emergency Medicine

## 2024-03-20 VITALS — BP 137/72 | HR 85 | Temp 98.6°F | Resp 16

## 2024-03-20 DIAGNOSIS — I10 Essential (primary) hypertension: Secondary | ICD-10-CM

## 2024-03-20 DIAGNOSIS — J069 Acute upper respiratory infection, unspecified: Secondary | ICD-10-CM

## 2024-03-20 MED ORDER — PROMETHAZINE-DM 6.25-15 MG/5ML PO SYRP: 5.0000 mL | ORAL_SOLUTION | Freq: Four times a day (QID) | ORAL | 0 refills | Status: DC | PRN

## 2024-03-20 MED ORDER — VALSARTAN 80 MG PO TABS: 80.0000 mg | ORAL_TABLET | Freq: Two times a day (BID) | ORAL | 0 refills | Status: DC

## 2024-03-20 MED ORDER — IPRATROPIUM BROMIDE 0.06 % NA SOLN: 2.0000 | Freq: Four times a day (QID) | NASAL | 12 refills | Status: DC

## 2024-03-20 MED ORDER — BENZONATATE 100 MG PO CAPS: 200.0000 mg | ORAL_CAPSULE | Freq: Three times a day (TID) | ORAL | 0 refills | Status: DC

## 2024-03-20 NOTE — ED Triage Notes (Signed)
 Patient presents to UC for cough x 7 days. Treating cough with OTC cough med.   Denies fever.

## 2024-03-20 NOTE — Telephone Encounter (Signed)
 Patient called and she says that she was seen at the [facility] today for a cough after calling the office and being told Dr. Shann Darnel didn't have any availability until 03/28/24. She says she couldn't wait that long. I advised I can look at other providers, she says that's why she ended up going to the UC because of no appointments. She says she was prescribed promethazine-DM for the cough and after researching the medication and with her BP issues, she doesn't feel comfortable taking this medication. She says Dr. Shann Darnel prescribed her hydrocodone-chlorphen-ER susp in January and she would like this medication prescribed for her cough. Advised I will send this to Dr. Shann Darnel. She would like the refill sent to:   [facility] #[id number] [place], [place] - [address] AT [facility] OF [address] [facility] Phone: [phone]  Fax: [phone]     Copied from CRM [id number]. Topic: Clinical - Medication Question >> Mar 20, 2024  4:24 PM Everette C wrote: Reason for CRM: The patient has been seen in the ED today and been prescribed promethazine-dextromethorphan (PROMETHAZINE-DM) 6.25-15 MG/5ML syrup [[id number]]  The patient is concerned with the prescription and would like to discuss being prescribed a potential alternative   Please contact the patient further when possible

## 2024-03-20 NOTE — ED Provider Notes (Signed)
 [facility]    CSN: [id number] Arrival date & time: 03/20/24  1246      History   Chief Complaint Chief Complaint  Patient presents with   Cough    HPI Kaylee Reyes is a 73 y.o. female.   HPI  73 year old female with past medical history significant for essential hypertension, GERD, high cholesterol, acquired hypothyroidism, breast cancer and status post bilateral mastectomy presents for evaluation of 1 week worth of respiratory symptoms that include runny nose, nasal congestion, nonproductive cough, shortness breath, and wheezing.  She denies any fever, ear pain, or sore throat.  Past Medical History:  Diagnosis Date   Allergy    Notes in my chart   Anemia    Arthritis 2016   Noted from Mri and xrays   Body tinea 10/01/2015   Previously treated by Dr. Orson Aloe    Cancer [facility]) 09/2015   Had full mascetomy   COVID-19 02/23/2021   MAB infusion 01/23/2021   GERD (gastroesophageal reflux disease)    History of breast cancer 09/2015   breast cancer   History of chicken pox    History of peptic ulcer disease 09/25/2015   Hyperlipidemia    Hypertension    Hypothyroidism    Neuropathy of foot    bilateral   Pinched nerve    PONV (postoperative nausea and vomiting)    exploratory surgery at [facility]   Thyroid disease    Tuberculosis exposure    Mother had TB.  Pt shows (+) on tests, but has never had.   Vertigo    last episode over 1 yr ago   Wears dentures    partial upper and lower    Patient Active Problem List   Diagnosis Date Noted   Peripheral edema 03/12/2024   Primary hypertension 03/12/2024   Degeneration of intervertebral disc of lumbar region 03/12/2024   Cystitis 12/15/2023   Subacute pancreatitis 06/26/2022   Abdominal pain 06/25/2022   Nausea 06/25/2022   Dehydration 06/25/2022   Elevated lipase 06/25/2022   Anemia 06/15/2022   Acute pancreatitis 06/05/2022   History of breast cancer 06/05/2022   S/P mastectomy 12/15/2021    Chemotherapy-induced neuropathy (HCC) 08/14/2021   Pedal edema 03/06/2021   SOB (shortness of breath) 03/06/2021   Pure hypercholesterolemia 03/06/2021   Obesity, Class III, BMI 40-49.9 (morbid obesity) (HCC) 08/06/2019   Menopause 03/23/2017   Rectocele 03/23/2017   Cystocele, midline 03/23/2017   Vaginal atrophy 03/23/2017   Left breast cancer with T3 tumor, >5 cm in greatest dimension (HCC)    S/P bilateral mastectomy 10/28/2015   Fatty infiltration of liver 10/01/2015   Gonalgia 10/01/2015   Invasive lobular carcinoma of right breast, stage 2 (HCC) 10/01/2015   Acquired hypothyroidism 09/25/2015   Obesity (BMI 30-39.9) 09/25/2015   Hypercholesterolemia 09/25/2015   Essential hypertension 09/25/2015   Allergic rhinitis 09/25/2015   GERD (gastroesophageal reflux disease) 09/25/2015   Eczema 09/25/2015    Past Surgical History:  Procedure Laterality Date   ABDOMINAL HYSTERECTOMY  07/13/2013   Total. with BSO for postmenopausal bleeding and fibroids with large cervical polyp. Dr. Dorian Furnace and Dr. Jean Rosenthal at [facility]   ABDOMINAL ULTRASOUND  04/17/2011   Hepatomegaly with borderline slenomegaly. Suggestive of fatty liver. s/p cholecystectomy. Portions of aorta obscured   BREAST SURGERY Right 2011   BREAST BIOPSY   CESAREAN SECTION  1988   CHOLECYSTECTOMY  1990   COLONOSCOPY WITH PROPOFOL N/A 09/01/2017   Procedure: COLONOSCOPY WITH PROPOFOL;  Surgeon: Midge Minium, MD;  Location: [facility];  Service: Gastroenterology;  Laterality: N/A;   DIAGNOSTIC LAPAROSCOPY     ESOPHAGOGASTRODUODENOSCOPY (EGD) WITH PROPOFOL N/A 01/17/2020   Procedure: ESOPHAGOGASTRODUODENOSCOPY (EGD) WITH PROPOFOL;  Surgeon: Marnee Sink, MD;  Location: [facility];  Service: Endoscopy;  Laterality: N/A;   EXPLORATORY LAPAROTOMY     Left breast   EYE SURGERY  2024   Cataract removed from both eyes   MASTECTOMY     MASTECTOMY W/ SENTINEL NODE BIOPSY Bilateral 10/28/2015   Procedure: MASTECTOMY WITH  SENTINEL LYMPH NODE BIOPSY;  Surgeon: Kandis Ormond, MD;  Location: [facility];  Service: General;  Laterality: Bilateral;   PORT-A-CATH REMOVAL Left 10/11/2016   Procedure: REMOVAL PORT-A-CATH;  Surgeon: Kandis Ormond, MD;  Location: [facility];  Service: General;  Laterality: Left;   PORTACATH PLACEMENT Left 12/23/2015   Procedure: INSERTION PORT-A-CATH;  Surgeon: Kandis Ormond, MD;  Location: [facility];  Service: General;  Laterality: Left;   TUBAL LIGATION  1988   After C-section   WRIST SURGERY      OB History     Gravida  3   Para  2   Term  2   Preterm      AB  1   Living  2      SAB  1   IAB      Ectopic      Multiple      Live Births  2            Home Medications    Prior to Admission medications   Medication Sig Start Date End Date Taking? Authorizing Provider  benzonatate (TESSALON) 100 MG capsule Take 2 capsules (200 mg total) by mouth every 8 (eight) hours. 03/20/24  Yes Kent Pear, NP  ipratropium (ATROVENT) 0.06 % nasal spray Place 2 sprays into both nostrils 4 (four) times daily. 03/20/24  Yes Kent Pear, NP  promethazine-dextromethorphan (PROMETHAZINE-DM) 6.25-15 MG/5ML syrup Take 5 mLs by mouth 4 (four) times daily as needed. 03/20/24  Yes Kent Pear, NP  amLODipine (NORVASC) 2.5 MG tablet Take 1 tablet (2.5 mg total) by mouth daily. 01/31/24   Lamon Pillow, MD  B Complex Vitamins (VITAMIN-B COMPLEX PO) Take by mouth.    [provider]  Calcium Carbonate Antacid (TUMS PO) Take by mouth as needed.    [provider]  Carboxymeth-Glycerin-Polysorb (REFRESH OPTIVE MEGA-3 OP) Place 1 drop into both eyes as needed.    [provider]  docusate sodium (COLACE) 100 MG capsule Take 200 mg by mouth daily. 2 in am, 1 at night    [provider]  fexofenadine (ALLEGRA) 60 MG tablet Take 60 mg by mouth daily.    [provider]  fluticasone (FLONASE) 50 MCG/ACT nasal spray SHAKE LIQUID AND USE  1 SPRAY IN EACH NOSTRIL DAILY Patient taking differently: as needed. 12/13/23   Lamon Pillow, MD  ketoconazole (NIZORAL) 2 % cream Apply topically daily as needed for irritation. 11/21/23   [provider]  levothyroxine (SYNTHROID) 75 MCG tablet TAKE 1 TABLET DAILY 04/18/23   Lamon Pillow, MD  meclizine (ANTIVERT) 12.5 MG tablet TAKE 1 TABLET(12.5 MG) BY MOUTH THREE TIMES DAILY AS NEEDED FOR DIZZINESS 12/16/23   Lamon Pillow, MD  mupirocin cream (BACTROBAN) 2 % Apply 1 Application topically 2 (two) times daily. 02/20/24   Lamon Pillow, MD  pantoprazole (PROTONIX) 40 MG tablet Take 1 tablet (40 mg total) by mouth daily. 02/18/24 02/17/25  Kinner,  Porfirio Bristol, MD  triamcinolone (KENALOG) 0.025 % cream Apply 1 Application topically 2 (two) times daily. As needed 05/17/22   [provider]  valsartan (DIOVAN) 40 MG tablet Take 1 tablet (40 mg total) by mouth daily. 03/19/24   Ostwalt, Janna, PA-C  valsartan (DIOVAN) 80 MG tablet Take 1 tablet (80 mg total) by mouth 2 (two) times daily. 03/20/24   Lamon Pillow, MD  Vitamin D, Ergocalciferol, (DRISDOL) 1.25 MG (50000 UNIT) CAPS capsule TAKE 1 CAPSULE BY MOUTH EVERY 7 DAYS 03/16/24   Shellie Dials, MD    Family History Family History  Problem Relation Age of Onset   Hypertension Mother    Thyroid disease Mother    Hypertension Brother    Diabetes Brother    Breast cancer Neg Hx    Ovarian cancer Neg Hx    Colon cancer Neg Hx     Social History Social History   Tobacco Use   Smoking status: Never   Smokeless tobacco: Never  Vaping Use   Vaping status: Never Used  Substance Use Topics   Alcohol use: Never   Drug use: Never     Allergies   Penicillins, Nickel, Cardura [doxazosin], Keflex [cephalexin], and Pravastatin sodium   Review of Systems Review of Systems  Constitutional:  Negative for fever.  HENT:  Positive for congestion and rhinorrhea. Negative for ear pain and sore throat.   Respiratory:   Positive for cough, shortness of breath and wheezing.      Physical Exam Triage Vital Signs ED Triage Vitals  Encounter Vitals Group     BP 03/20/24 1253 (!) 196/80     Systolic BP Percentile --      Diastolic BP Percentile --      Pulse Rate 03/20/24 1253 85     Resp 03/20/24 1253 16     Temp 03/20/24 1253 98.6 F (37 C)     Temp Source 03/20/24 1253 Oral     SpO2 03/20/24 1253 97 %     Weight --      Height --      Head Circumference --      Peak Flow --      Pain Score 03/20/24 1300 0     Pain Loc --      Pain Education --      Exclude from Growth Chart --    No data found.  Updated Vital Signs BP 137/72 (BP Location: Left Arm)   Pulse 85   Temp 98.6 F (37 C) (Oral)   Resp 16   SpO2 97%   Visual Acuity Right Eye Distance:   Left Eye Distance:   Bilateral Distance:    Right Eye Near:   Left Eye Near:    Bilateral Near:     Physical Exam Vitals and nursing note reviewed.  Constitutional:      Appearance: Normal appearance. She is not ill-appearing.  HENT:     Head: Normocephalic and atraumatic.     Right Ear: Tympanic membrane, ear canal and external ear normal. There is no impacted cerumen.     Left Ear: Tympanic membrane, ear canal and external ear normal. There is no impacted cerumen.     Nose: Congestion and rhinorrhea present.     Comments: Mucosa is edematous and erythematous with clear discharge in both nares.    Mouth/Throat:     Mouth: Mucous membranes are moist.     Pharynx: Oropharynx is clear. No oropharyngeal exudate or posterior oropharyngeal  erythema.  Cardiovascular:     Rate and Rhythm: Normal rate and regular rhythm.     Pulses: Normal pulses.     Heart sounds: Normal heart sounds. No murmur heard.    No friction rub. No gallop.  Pulmonary:     Effort: Pulmonary effort is normal.     Breath sounds: Normal breath sounds. No wheezing, rhonchi or rales.  Musculoskeletal:     Cervical back: Normal range of motion and neck supple. No  tenderness.  Lymphadenopathy:     Cervical: No cervical adenopathy.  Skin:    General: Skin is warm and dry.     Capillary Refill: Capillary refill takes less than 2 seconds.     Findings: No rash.  Neurological:     General: No focal deficit present.     Mental Status: She is alert and oriented to person, place, and time.      UC Treatments / Results  Labs (all labs ordered are listed, but only abnormal results are displayed) Labs Reviewed - No data to display  EKG   Radiology No results found.  Procedures Procedures (including critical care time)  Medications Ordered in UC Medications - No data to display  Initial Impression / Assessment and Plan / UC Course  I have reviewed the triage vital signs and the nursing notes.  Pertinent labs & imaging results that were available during my care of the patient were reviewed by me and considered in my medical decision making (see chart for details).   Patient is a pleasant, nontoxic-appearing 73 year old female presenting for evaluation 1 week with respiratory symptoms as outlined HPI above.  The patient's most pressing complaint is that she is continuing to have a nonproductive, hacking cough with some shortness breath and wheezing.  Her cough is worse at night when she lays down.  She is able to speak in full sentence without dyspnea or tachypnea in her room or her oxygen saturation is 97%.  Respiratory rate at triage was 16.  She is afebrile with a temp of 98.6.  Cardiopulmonary exam reveals clear lung sounds in all fields.  She does have inflammation of her respiratory tract as evidenced by inflamed nasal mucosa with clear rhinorrhea.  Oropharyngeal exam is benign.  Differential diagnose include COVID, influenza, viral respiratory illness.  Given that she is on day 7 of symptoms I will not test her at this time.  Her husband became sick at the same time with similar symptoms though he is experiencing a productive cough.  I have  ordered a chest x-ray and her husband.  If he comes back positive for pneumonia I will treat them both with antibiotics.  However, if his chest x-ray is negative I will treat them both for viral URI.  Patient's chest x-ray is negative for pneumonia.  I will discharge patient on the diagnosis of viral URI with a cough with prescription for Atrovent Nasabid up nasal congestion.  Tessalon Perles of breath and him cough syrup for cough and congestion.  Return precautions reviewed.   Final Clinical Impressions(s) / UC Diagnoses   Final diagnoses:  Viral URI with cough     Discharge Instructions      Your exam is consistent with a viral respiratory infection.  Please use over-the-counter Tylenol and/or ibuprofen according the package directions as needed for any fever or pain.  Use the Atrovent nasal spray, 2 squirts in each nostril every 6 hours, as needed for runny nose and postnasal drip.  Use  the Tessalon Perles every 8 hours during the day.  Take them with a small sip of water.  They may give you some numbness to the base of your tongue or a metallic taste in your mouth, this is normal.  Use the Promethazine DM cough syrup at bedtime for cough and congestion.  It will make you drowsy so do not take it during the day.  Return for reevaluation or see your primary care provider for any new or worsening symptoms.      ED Prescriptions     Medication Sig Dispense Auth. Provider   benzonatate (TESSALON) 100 MG capsule Take 2 capsules (200 mg total) by mouth every 8 (eight) hours. 21 capsule Kent Pear, NP   ipratropium (ATROVENT) 0.06 % nasal spray Place 2 sprays into both nostrils 4 (four) times daily. 15 mL Kent Pear, NP   promethazine-dextromethorphan (PROMETHAZINE-DM) 6.25-15 MG/5ML syrup Take 5 mLs by mouth 4 (four) times daily as needed. 118 mL Kent Pear, NP      PDMP not reviewed this encounter.   Kent Pear, NP 03/20/24 1407

## 2024-03-20 NOTE — Discharge Instructions (Addendum)
 Your exam is consistent with a viral respiratory infection.  Please use over-the-counter Tylenol and/or ibuprofen according the package directions as needed for any fever or pain.  Use the Atrovent nasal spray, 2 squirts in each nostril every 6 hours, as needed for runny nose and postnasal drip.  Use the Tessalon Perles every 8 hours during the day.  Take them with a small sip of water.  They may give you some numbness to the base of your tongue or a metallic taste in your mouth, this is normal.  Use the Promethazine DM cough syrup at bedtime for cough and congestion.  It will make you drowsy so do not take it during the day.  Return for reevaluation or see your primary care provider for any new or worsening symptoms.

## 2024-03-20 NOTE — Telephone Encounter (Signed)
 Requested Prescriptions  Pending Prescriptions Disp Refills   valsartan (DIOVAN) 80 MG tablet 180 tablet 0    Sig: Take 1 tablet (80 mg total) by mouth 2 (two) times daily.     Cardiovascular:  Angiotensin Receptor Blockers Failed - 03/20/2024 11:10 AM      Failed - Last BP in normal range    BP Readings from Last 1 Encounters:  03/12/24 (!) 164/71         Passed - Cr in normal range and within 180 days    Creatinine  Date Value Ref Range Status  07/15/2013 0.98 0.60 - 1.30 mg/dL Final   Creatinine, Ser  Date Value Ref Range Status  02/18/2024 0.88 0.44 - 1.00 mg/dL Final         Passed - K in normal range and within 180 days    Potassium  Date Value Ref Range Status  02/18/2024 4.3 3.5 - 5.1 mmol/L Final  07/15/2013 4.1 3.5 - 5.1 mmol/L Final         Passed - Patient is not pregnant      Passed - Valid encounter within last 6 months    Recent Outpatient Visits           1 week ago Primary hypertension   [place] [facility] [place], [place], PA-C   3 weeks ago Swelling of right parotid gland   [place] [facility] [place], [place]in Buzzard, NP   4 weeks ago Folliculitis of eyelid   [place] [facility] [place] Lamon Pillow, MD   1 month ago Essential hypertension   [place] [facility] Lamon Pillow, MD       Future Appointments             Today [facility] [place]

## 2024-03-21 MED ORDER — HYDROCOD POLI-CHLORPHE POLI ER 10-8 MG/5ML PO SUER: 5.0000 mL | Freq: Two times a day (BID) | ORAL | 0 refills | Status: AC | PRN

## 2024-03-26 ENCOUNTER — Encounter: Payer: Self-pay | Admitting: Family Medicine

## 2024-03-26 ENCOUNTER — Telehealth: Payer: Self-pay

## 2024-03-26 ENCOUNTER — Ambulatory Visit: Payer: Self-pay | Admitting: *Deleted

## 2024-03-26 ENCOUNTER — Ambulatory Visit (INDEPENDENT_AMBULATORY_CARE_PROVIDER_SITE_OTHER): Admitting: Family Medicine

## 2024-03-26 VITALS — BP 139/60 | HR 79 | Ht 66.0 in | Wt 297.0 lb

## 2024-03-26 DIAGNOSIS — N3090 Cystitis, unspecified without hematuria: Secondary | ICD-10-CM | POA: Diagnosis not present

## 2024-03-26 DIAGNOSIS — R300 Dysuria: Secondary | ICD-10-CM

## 2024-03-26 DIAGNOSIS — Z8719 Personal history of other diseases of the digestive system: Secondary | ICD-10-CM | POA: Diagnosis not present

## 2024-03-26 DIAGNOSIS — M545 Low back pain, unspecified: Secondary | ICD-10-CM

## 2024-03-26 LAB — POCT URINALYSIS DIPSTICK
Bilirubin, UA: NEGATIVE
Glucose, UA: NEGATIVE
Ketones, UA: NEGATIVE
Nitrite, UA: POSITIVE
Protein, UA: POSITIVE — AB
Spec Grav, UA: >=1.03 — AB (ref 1.010–1.025)
Urobilinogen, UA: 0.2 U/dL — AB
pH, UA: 6 (ref 5.0–8.0)

## 2024-03-26 MED ORDER — CIPROFLOXACIN HCL 500 MG PO TABS: 500.0000 mg | ORAL_TABLET | Freq: Two times a day (BID) | ORAL | 0 refills | Status: AC

## 2024-03-26 NOTE — Progress Notes (Signed)
 ACUTE PATIENT VISIT    Patient: Kaylee Reyes   DOB: 07/24/1951   73 y.o. Female  MRN: [id number] Visit Date: 03/26/2024  Today's healthcare provider: Mimi Alt, MD   PCP: Lamon Pillow, MD  Chief Complaint  Patient presents with   Urinary Tract Infection    Possible UTI urinary burning, nausea, this has been going on for a few days     Subjective     HPI     Urinary Tract Infection    Additional comments: Possible UTI urinary burning, nausea, this has been going on for a few days       Last edited by Bart Lieu, CMA on 03/26/2024 10:17 AM.       Discussed the use of AI scribe software for clinical note transcription with the patient, who gave verbal consent to proceed.  History of Present Illness          Discussed the use of AI scribe software for clinical note transcription with the patient, who gave verbal consent to proceed.  History of Present Illness Kaylee Reyes is a 73 year old female with pancreatitis and gastritis who presents with back pain and dysuria.  She experiences back pain that predates her urinary symptoms. The pain is a dull ache, sometimes intermittent, but often persistent enough to disturb her sleep. It is located on the left side, midway up her back. She is unsure if it radiates from her pancreas, colon, or stomach. She has a history of pancreatitis and gastritis and has an upcoming appointment with her gastroenterologist in August. She is concerned about the impact of her medication, valsartan , on her kidneys and has not had recent kidney function tests.  She has been experiencing dysuria and suspects a urinary tract infection (UTI). A recent urine analysis showed positive nitrites and leukocytes, consistent with a UTI. She has been taking Azo for symptom relief, but only two doses per day to conserve the medication over the weekend. She has a history of UTIs that typically cause severe pain, but this episode is  less intense, leading her to suspect possible kidney involvement. Previous urine cultures showed resistance to several antibiotics, but ciprofloxacin  was effective.  She experiences constipation, for which she takes MiraLAX, and occasional nausea in the mornings. No blood or black stools, and her bowel movements are light brown. She recalls a previous episode where she stopped taking pantoprazole , leading to a flare-up of stomach pain, initially suspected to be pancreatic in origin, but tests showed normal lipase levels. She resumed pantoprazole , but the pain persists.  In 2023, an MRI of her abdomen showed a normal pancreas, mild splenomegaly, and fatty liver changes. She is unsure of the cause of the splenomegaly, but it may contribute to her discomfort. She also mentions a persistent cough, which she attributes to a viral infection, and has been using prescribed cough syrup for relief.       Past Medical History:  Diagnosis Date   Allergy    Notes in my chart   Anemia    Arthritis 2016   Noted from Mri and xrays   Body tinea 10/01/2015   Previously treated by Dr. Teofilo Fellers    Cancer [facility]) 09/2015   Had full mascetomy   COVID-19 02/23/2021   MAB infusion 01/23/2021   GERD (gastroesophageal reflux disease)    History of breast cancer 09/2015   breast cancer   History of chicken pox    History of peptic  ulcer disease 09/25/2015   Hyperlipidemia    Hypertension    Hypothyroidism    Neuropathy of foot    bilateral   Pinched nerve    PONV (postoperative nausea and vomiting)    exploratory surgery at [facility]   Thyroid  disease    Tuberculosis exposure    Mother had TB.  Pt shows (+) on tests, but has never had.   Vertigo    last episode over 1 yr ago   Wears dentures    partial upper and lower    Medications: Outpatient Medications Prior to Visit  Medication Sig   amLODipine  (NORVASC ) 2.5 MG tablet Take 1 tablet (2.5 mg total) by mouth daily.   B Complex Vitamins (VITAMIN-B  COMPLEX PO) Take by mouth.   benzonatate  (TESSALON ) 100 MG capsule Take 2 capsules (200 mg total) by mouth every 8 (eight) hours.   Calcium  Carbonate Antacid (TUMS PO) Take by mouth as needed.   Carboxymeth-Glycerin-Polysorb (REFRESH OPTIVE MEGA-3 OP) Place 1 drop into both eyes as needed.   chlorpheniramine-HYDROcodone  (TUSSIONEX) 10-8 MG/5ML Take 5 mLs by mouth every 12 (twelve) hours as needed for up to 10 days for cough.   docusate sodium  (COLACE) 100 MG capsule Take 200 mg by mouth daily. 2 in am, 1 at night   fexofenadine (ALLEGRA) 60 MG tablet Take 60 mg by mouth daily.   fluticasone  (FLONASE ) 50 MCG/ACT nasal spray SHAKE LIQUID AND USE 1 SPRAY IN EACH NOSTRIL DAILY (Patient taking differently: as needed.)   ipratropium (ATROVENT ) 0.06 % nasal spray Place 2 sprays into both nostrils 4 (four) times daily.   ketoconazole  (NIZORAL ) 2 % cream Apply topically daily as needed for irritation.   levothyroxine  (SYNTHROID ) 75 MCG tablet TAKE 1 TABLET DAILY   meclizine  (ANTIVERT ) 12.5 MG tablet TAKE 1 TABLET(12.5 MG) BY MOUTH THREE TIMES DAILY AS NEEDED FOR DIZZINESS   mupirocin  cream (BACTROBAN ) 2 % Apply 1 Application topically 2 (two) times daily.   pantoprazole  (PROTONIX ) 40 MG tablet Take 1 tablet (40 mg total) by mouth daily.   triamcinolone  (KENALOG ) 0.025 % cream Apply 1 Application topically 2 (two) times daily. As needed   valsartan  (DIOVAN ) 80 MG tablet Take 1 tablet (80 mg total) by mouth 2 (two) times daily.   Vitamin D , Ergocalciferol , (DRISDOL ) 1.25 MG (50000 UNIT) CAPS capsule TAKE 1 CAPSULE BY MOUTH EVERY 7 DAYS   valsartan  (DIOVAN ) 40 MG tablet Take 1 tablet (40 mg total) by mouth daily. (Patient not taking: Reported on 03/26/2024)   No facility-administered medications prior to visit.    Review of Systems  Last CBC Lab Results  Component Value Date   WBC 7.0 02/18/2024   HGB 11.7 (L) 02/18/2024   HCT 35.8 (L) 02/18/2024   MCV 85.9 02/18/2024   MCH 28.1 02/18/2024   RDW  14.4 02/18/2024   PLT 185 02/18/2024   Last metabolic panel Lab Results  Component Value Date   GLUCOSE 115 (H) 02/18/2024   NA 135 02/18/2024   K 4.3 02/18/2024   CL 103 02/18/2024   CO2 23 02/18/2024   BUN 14 02/18/2024   CREATININE 0.88 02/18/2024   GFRNONAA >60 02/18/2024   CALCIUM  8.9 02/18/2024   PHOS 3.5 06/26/2022   PROT 8.6 (H) 02/18/2024   ALBUMIN 4.2 02/18/2024   LABGLOB 3.1 06/15/2022   AGRATIO 1.5 06/15/2022   BILITOT 0.6 02/18/2024   ALKPHOS 82 02/18/2024   AST 38 02/18/2024   ALT 39 02/18/2024   ANIONGAP 9 02/18/2024  Last hemoglobin A1c No results found for: "HGBA1C" Last thyroid  functions Lab Results  Component Value Date   TSH 2.113 10/05/2022   T4TOTAL 7.4 04/04/2017        Objective    BP 139/60   Pulse 79   Ht 5\' 6"  (1.676 m)   Wt 297 lb (134.7 kg)   SpO2 97%   BMI 47.94 kg/m  BP Readings from Last 3 Encounters:  03/26/24 139/60  03/20/24 137/72  03/12/24 (!) 164/71   Wt Readings from Last 3 Encounters:  03/26/24 297 lb (134.7 kg)  03/12/24 (!) 300 lb 3.2 oz (136.2 kg)  02/23/24 290 lb 9.6 oz (131.8 kg)        Physical Exam Vitals reviewed.  Constitutional:      General: She is not in acute distress.    Appearance: Normal appearance. She is not ill-appearing, toxic-appearing or diaphoretic.  Eyes:     Conjunctiva/sclera: Conjunctivae normal.  Cardiovascular:     Rate and Rhythm: Normal rate and regular rhythm.     Pulses: Normal pulses.     Heart sounds: Normal heart sounds. No murmur heard.    No friction rub. No gallop.  Pulmonary:     Effort: Pulmonary effort is normal. No respiratory distress.     Breath sounds: Normal breath sounds. No stridor. No wheezing, rhonchi or rales.  Abdominal:     General: Bowel sounds are normal. There is no distension.     Palpations: Abdomen is soft.     Tenderness: There is generalized abdominal tenderness and tenderness in the epigastric area and left upper quadrant. There is no  right CVA tenderness or left CVA tenderness.  Musculoskeletal:     Right lower leg: No edema.     Left lower leg: No edema.  Skin:    Findings: No erythema or rash.  Neurological:     Mental Status: She is alert and oriented to person, place, and time.  Psychiatric:        Mood and Affect: Mood and affect normal.        Speech: Speech normal.        Behavior: Behavior normal. Behavior is cooperative.     Physical Exam      Results for orders placed or performed in visit on 03/26/24  POCT Urinalysis Dipstick  Result Value Ref Range   Color, UA dark yellow    Clarity, UA clear    Glucose, UA Negative Negative   Bilirubin, UA negative    Ketones, UA Negative    Spec Grav, UA >=1.030 (A) 1.010 - 1.025   Blood, UA Moderate    pH, UA 6.0 5.0 - 8.0   Protein, UA Positive (A) Negative   Urobilinogen, UA 0.2 (A) 0.2 or 1.0 E.U./dL   Nitrite, UA positive    Leukocytes, UA Moderate (2+) (A) Negative   Appearance     Odor      Assessment & Plan     Problem List Items Addressed This Visit       Genitourinary   Cystitis   Relevant Medications   ciprofloxacin  (CIPRO ) 500 MG tablet   Other Relevant Orders   Urine Culture   Other Visit Diagnoses       Dysuria    -  Primary   Relevant Medications   ciprofloxacin  (CIPRO ) 500 MG tablet   Other Relevant Orders   POCT Urinalysis Dipstick (Completed)   CBC   CMP14+EGFR   Urine Culture  Acute left-sided low back pain without sciatica       Relevant Orders   CBC   CMP14+EGFR   Lipase     History of pancreatitis       Relevant Orders   Lipase        Assessment & Plan Urinary Tract Infection (UTI) Positive nitrites and leukocytes in urinalysis indicate UTI. Reports dysuria. Previous urine cultures showed resistance to ampicillin, tetracycline, and Bactrim , but sensitivity to nitrofurantoin  and ciprofloxacin . Symptoms atypical for previous UTIs, raising concern for pyelonephritis due to back pain. Ciprofloxacin   chosen for its effectiveness against potential pyelonephritis, as nitrofurantoin  would be ineffective for renal infection. - Prescribe ciprofloxacin  500 mg twice a day for 8 days - Order urine culture to identify causative organism - urine culture ordered, UA abnormal  - CMP ordered   Chronic Back Pain and LUQ Pain  Dull, constant ache in the left mid-back, sometimes disrupting sleep. Pancreatitis and gastritis history. 2023 MRI showed normal pancreas, mild splenomegaly, and fatty liver changes. Pain may relate to splenomegaly or gastrointestinal issues. Scheduled gastroenterology consultation in August. Concern about kidney function due to valsartan  use. - Order CBC, complete metabolic panel, and lipase to evaluate liver, kidney, and pancreatic function   General Health Maintenance Concern about kidney function due to valsartan  use. - Monitor kidney function regularly       No follow-ups on file.         Mimi Alt, MD  [facility] [phone] (phone) [phone] (fax)  [facility]

## 2024-03-26 NOTE — Telephone Encounter (Signed)
 Chief Complaint: urinary burning , abdominal/back pain. C/o right ankle swelling, wants to have "blood work" for kidneys  Symptoms: urinary burning, pain abdominal area radiates to back and VV. C/o nausea. Taking AZO x 4 days 1 per day. C/o BP issues and right ankle swelling. Patient started back on amlodipine  after stopping.  Frequency: 4-5 days ago  Pertinent Negatives: Patient denies chest pain no difficulty breathing no fever reported. No blood in urine no report of elevated BP now  Disposition: [] ED /[] Urgent Care (no appt availability in office) / [x] Appointment(In office/virtual)/ []  [place] [facility]/ [] Home Care/ [] Refused Recommended Disposition /[] [facility]/ []  Follow-up with PCP Additional Notes:   No available appt with PCP today. Due to sx scheduled appt today with other provider in office. Patient also has appt for BP f/u on 03/28/24.         Copied from CRM [id number]. Topic: Clinical - Red Word Triage >> Mar 26, 2024  8:06 AM Ivette P wrote: [facility] that prompted transfer to Nurse Triage: pt has UTi, and pain in back and rotates into stomach.   cant see Gastro until August, unsure if it is kidney, colon, pancreas realted Reason for Disposition  MILD or MODERATE ankle swelling (e.g., can't move joint normally, can't do usual activities) (Exceptions: Itchy, localized swelling; swelling is chronic.)  Side (flank) or lower back pain present  Answer Assessment - Initial Assessment Questions 1. LOCATION: "Which ankle is swollen?" "Where is the swelling?"     Right ankle "puffy" 2. ONSET: "When did the swelling start?"     On going  3. SWELLING: "How bad is the swelling?" Or, "How large is it?" (e.g., mild, moderate, severe; size of localized swelling)    - NONE: No joint swelling.   - LOCALIZED: Localized; small area of puffy or swollen skin (e.g., insect bite, skin irritation).   - MILD: Joint looks or feels mildly swollen or puffy.   - MODERATE:  Swollen; interferes with normal activities (e.g., work or school); decreased range of movement; may be limping.   - SEVERE: Very swollen; can't move swollen joint at all; limping a lot or unable to walk.     No  4. PAIN: "Is there any pain?" If Yes, ask: "How bad is it?" (Scale 1-10; or mild, moderate, severe)   - NONE (0): no pain.   - MILD (1-3): doesn't interfere with normal activities.    - MODERATE (4-7): interferes with normal activities (e.g., work or school) or awakens from sleep, limping.    - SEVERE (8-10): excruciating pain, unable to do any normal activities, unable to walk.      Dull ache in stomach and back not sleeping through night  5. CAUSE: "What do you think caused the ankle swelling?"     Na  6. OTHER SYMPTOMS: "Do you have any other symptoms?" (e.g., fever, chest pain, difficulty breathing, calf pain)     Right ankle swelling , pain radiating from back and stomach  7. PREGNANCY: "Is there any chance you are pregnant?" "When was your last menstrual period?"     na  Answer Assessment - Initial Assessment Questions 1. SEVERITY: "How bad is the pain?"  (e.g., Scale 1-10; mild, moderate, or severe)   - MILD (1-3): complains slightly about urination hurting   - MODERATE (4-7): interferes with normal activities     - SEVERE (8-10): excruciating, unwilling or unable to urinate because of the pain      Worsening urinary  burning  2. FREQUENCY: "How many times have you had painful urination today?"      na 3. PATTERN: "Is pain present every time you urinate or just sometimes?"      na 4. ONSET: "When did the painful urination start?"      4-5 days  5. FEVER: "Do you have a fever?" If Yes, ask: "What is your temperature, how was it measured, and when did it start?"     na 6. PAST UTI: "Have you had a urine infection before?" If Yes, ask: "When was the last time?" and "What happened that time?"      na 7. CAUSE: "What do you think is causing the painful urination?"  (e.g.,  UTI, scratch, Herpes sore)     UTI 8. OTHER SYMPTOMS: "Do you have any other symptoms?" (e.g., blood in urine, flank pain, genital sores, urgency, vaginal discharge)     Burning , frequency, abdominal pain radiates to back and VV 9. PREGNANCY: "Is there any chance you are pregnant?" "When was your last menstrual period?"     na  Protocols used: Ankle Swelling-A-AH, Urination Pain - Female-A-AH

## 2024-03-26 NOTE — Telephone Encounter (Unsigned)
 Copied from CRM [id number]. Topic: Clinical - Request for Lab/Test Order >> Mar 26, 2024  8:05 AM Kaylee Reyes wrote: Reason for CRM: PT would like lab work for her Kidney, pt is wanting to have her follow up lab work for appt coming up 04/23- 10 AM

## 2024-03-27 LAB — CMP14+EGFR
ALT: 32 IU/L (ref 0–32)
AST: 28 IU/L (ref 0–40)
Albumin: 4.5 g/dL (ref 3.8–4.8)
Alkaline Phosphatase: 107 IU/L (ref 44–121)
BUN/Creatinine Ratio: 14 (ref 12–28)
BUN: 12 mg/dL (ref 8–27)
Bilirubin Total: 0.5 mg/dL (ref 0.0–1.2)
CO2: 21 mmol/L (ref 20–29)
Calcium: 9.5 mg/dL (ref 8.7–10.3)
Chloride: 103 mmol/L (ref 96–106)
Creatinine, Ser: 0.87 mg/dL (ref 0.57–1.00)
Globulin, Total: 2.7 g/dL (ref 1.5–4.5)
Glucose: 105 mg/dL — ABNORMAL HIGH (ref 70–99)
Potassium: 4.3 mmol/L (ref 3.5–5.2)
Sodium: 140 mmol/L (ref 134–144)
Total Protein: 7.2 g/dL (ref 6.0–8.5)
eGFR: 71 mL/min/{1.73_m2} (ref >59)

## 2024-03-27 LAB — CBC
Hematocrit: 34.7 % (ref 34.0–46.6)
Hemoglobin: 11.2 g/dL (ref 11.1–15.9)
MCH: 28.1 pg (ref 26.6–33.0)
MCHC: 32.3 g/dL (ref 31.5–35.7)
MCV: 87 fL (ref 79–97)
Platelets: 213 10*3/uL (ref 150–450)
RBC: 3.98 x10E6/uL (ref 3.77–5.28)
RDW: 13.6 % (ref 11.7–15.4)
WBC: 8.6 10*3/uL (ref 3.4–10.8)

## 2024-03-27 LAB — LIPASE: Lipase: 38 U/L (ref 14–85)

## 2024-03-28 ENCOUNTER — Ambulatory Visit: Admitting: Family Medicine

## 2024-03-29 LAB — URINE CULTURE

## 2024-03-30 ENCOUNTER — Inpatient Hospital Stay
Admission: RE | Admit: 2024-03-30 | Discharge: 2024-03-30 | Disposition: A | Discharge status: Home / Self Care | Payer: Self-pay | Source: Ambulatory Visit | Attending: Orthopedic Surgery | Admitting: Orthopedic Surgery

## 2024-03-30 ENCOUNTER — Telehealth: Payer: Self-pay

## 2024-03-30 ENCOUNTER — Other Ambulatory Visit: Payer: Self-pay | Admitting: Family Medicine

## 2024-03-30 ENCOUNTER — Telehealth: Payer: Self-pay | Admitting: Family Medicine

## 2024-03-30 DIAGNOSIS — Z049 Encounter for examination and observation for unspecified reason: Secondary | ICD-10-CM

## 2024-03-30 DIAGNOSIS — N3090 Cystitis, unspecified without hematuria: Secondary | ICD-10-CM

## 2024-03-30 MED ORDER — NITROFURANTOIN MACROCRYSTAL 100 MG PO CAPS: 100.0000 mg | ORAL_CAPSULE | Freq: Two times a day (BID) | ORAL | 0 refills | Status: AC

## 2024-03-30 NOTE — Telephone Encounter (Signed)
 Copied from CRM [id number]. Topic: Clinical - Prescription Issue >> Mar 30, 2024  8:04 AM Baldemar Lev wrote: Reason for CRM: Pt is currently prescribed ciprofloxacin  (CIPRO ) 500 MG tablet but prefers to have this changed.   Pt is requesting NitroFurantoin  (Macrobid ) prefers the generic.   [id number] [place], [place] - [address] AT [facility] [address] [place] [address] [place] [address] Phone: [phone] Fax: [phone]

## 2024-03-30 NOTE — Telephone Encounter (Signed)
 done

## 2024-03-30 NOTE — Telephone Encounter (Signed)
 Copied from CRM [id number]. Topic: General - Other >> Mar 30, 2024  3:50 PM Carlatta H wrote: Reason for CRM: Patient received a jury summons and would like a letter stating that due to medical issues she can not attend//Please call patient to advise if this is something the office can provide//She would like to be exempt from jury duty//

## 2024-03-30 NOTE — Addendum Note (Signed)
 Addended by: Lamon Pillow on: 03/30/2024 10:35 AM   Modules accepted: Orders

## 2024-04-02 NOTE — Telephone Encounter (Signed)
 Letter written signed and left at my workstation

## 2024-04-02 NOTE — Telephone Encounter (Signed)
 Patient came and picked up letter today.

## 2024-04-04 NOTE — Progress Notes (Signed)
 Referring Physician:  Lamon Pillow, MD [address] [place],  [place] [address]  Primary Physician:  Lamon Pillow, MD  History of Present Illness: 04/05/2024 Ms. Kaylee Reyes has a history of HTN, GERD, fatty liver, h/o acute pancreatitis, hypothyroidism, breast CA, chem induced neuropathy, obesity, gastritis.   She saw PCP on 03/26/24 with back pain and dysuria. She was given cipro .   She has history of chronic intermittent LBP that has been worse and more constant in last year.   She has more constant LBP that is described as a dull ache. No leg pain. Numbness, tingling, or weakness. Feels like she is hunching over when she walks. Pain is worse with prolonged standing and walking. Pain is better with sitting in her recliner. Grocery cart helps, but she still has LBP.   Care with NSAIDs due to gastritis.   She does not smoke.   Bowel/Bladder Dysfunction: none  Conservative measures:  Physical therapy:  has not participated in PT recently, did not help in the past.  Multimodal medical therapy including regular antiinflammatories: none  Injections:  07/31/2019 L5-S1 ESI that helped.   Past Surgery: no spinal surgeries   CHASITEE MATTHIESEN has no symptoms of cervical myelopathy.  The symptoms are causing a significant impact on the patient's life.   Review of Systems:  A 10 point review of systems is negative, except for the pertinent positives and negatives detailed in the HPI.  Past Medical History: Past Medical History:  Diagnosis Date   Allergy    Notes in my chart   Anemia    Arthritis 2016   Noted from Mri and xrays   Body tinea 10/01/2015   Previously treated by Dr. Teofilo Fellers    Cancer [facility]) 09/2015   Had full mascetomy   COVID-19 02/23/2021   MAB infusion 01/23/2021   GERD (gastroesophageal reflux disease)    History of breast cancer 09/2015   breast cancer   History of chicken pox    History of peptic ulcer disease 09/25/2015    Hyperlipidemia    Hypertension    Hypothyroidism    Neuropathy of foot    bilateral   Pinched nerve    PONV (postoperative nausea and vomiting)    exploratory surgery at [facility]   Thyroid  disease    Tuberculosis exposure    Mother had TB.  Pt shows (+) on tests, but has never had.   Vertigo    last episode over 1 yr ago   Wears dentures    partial upper and lower    Past Surgical History: Past Surgical History:  Procedure Laterality Date   ABDOMINAL HYSTERECTOMY  07/13/2013   Total. with BSO for postmenopausal bleeding and fibroids with large cervical polyp. Dr. Su Ellison and Dr. Cleora Daft at [facility]   ABDOMINAL ULTRASOUND  04/17/2011   Hepatomegaly with borderline slenomegaly. Suggestive of fatty liver. s/p cholecystectomy. Portions of aorta obscured   BREAST SURGERY Right 2011   BREAST BIOPSY   CESAREAN SECTION  1988   CHOLECYSTECTOMY  1990   COLONOSCOPY WITH PROPOFOL  N/A 09/01/2017   Procedure: COLONOSCOPY WITH PROPOFOL ;  Surgeon: Marnee Sink, MD;  Location: [facility];  Service: Gastroenterology;  Laterality: N/A;   DIAGNOSTIC LAPAROSCOPY     ESOPHAGOGASTRODUODENOSCOPY (EGD) WITH PROPOFOL  N/A 01/17/2020   Procedure: ESOPHAGOGASTRODUODENOSCOPY (EGD) WITH PROPOFOL ;  Surgeon: Marnee Sink, MD;  Location: [facility];  Service: Endoscopy;  Laterality: N/A;   EXPLORATORY LAPAROTOMY     Left breast  EYE SURGERY  2024   Cataract removed from both eyes   MASTECTOMY     MASTECTOMY W/ SENTINEL NODE BIOPSY Bilateral 10/28/2015   Procedure: MASTECTOMY WITH SENTINEL LYMPH NODE BIOPSY;  Surgeon: Kandis Ormond, MD;  Location: [facility];  Service: General;  Laterality: Bilateral;   PORT-A-CATH REMOVAL Left 10/11/2016   Procedure: REMOVAL PORT-A-CATH;  Surgeon: Kandis Ormond, MD;  Location: [facility];  Service: General;  Laterality: Left;   PORTACATH PLACEMENT Left 12/23/2015   Procedure: INSERTION PORT-A-CATH;  Surgeon: Kandis Ormond, MD;  Location: [facility];  Service:  General;  Laterality: Left;   TUBAL LIGATION  1988   After C-section   WRIST SURGERY      Allergies: Allergies as of 04/05/2024 - Review Complete 04/05/2024  Allergen Reaction Noted   Penicillins Shortness Of Breath 08/20/2015   Nickel Hives 10/15/2015   Cardura  [doxazosin ] Other (See Comments) 01/03/2023   Keflex [cephalexin] Swelling 01/21/2021   Pravastatin sodium Other (See Comments) 08/20/2015    Medications: Outpatient Encounter Medications as of 04/05/2024  Medication Sig   amLODipine  (NORVASC ) 2.5 MG tablet Take 1 tablet (2.5 mg total) by mouth daily.   B Complex Vitamins (VITAMIN-B COMPLEX PO) Take by mouth.   Calcium  Carbonate Antacid (TUMS PO) Take by mouth as needed.   Carboxymeth-Glycerin-Polysorb (REFRESH OPTIVE MEGA-3 OP) Place 1 drop into both eyes as needed.   docusate sodium  (COLACE) 100 MG capsule Take 200 mg by mouth daily. 2 in am, 1 at night   fexofenadine (ALLEGRA) 60 MG tablet Take 60 mg by mouth daily.   fluticasone  (FLONASE ) 50 MCG/ACT nasal spray SHAKE LIQUID AND USE 1 SPRAY IN EACH NOSTRIL DAILY (Patient taking differently: as needed.)   ipratropium (ATROVENT ) 0.06 % nasal spray Place 2 sprays into both nostrils 4 (four) times daily.   ketoconazole  (NIZORAL ) 2 % cream Apply topically daily as needed for irritation.   levothyroxine  (SYNTHROID ) 75 MCG tablet TAKE 1 TABLET DAILY   meclizine  (ANTIVERT ) 12.5 MG tablet TAKE 1 TABLET(12.5 MG) BY MOUTH THREE TIMES DAILY AS NEEDED FOR DIZZINESS   mupirocin  cream (BACTROBAN ) 2 % Apply 1 Application topically 2 (two) times daily.   nitrofurantoin  (MACRODANTIN ) 100 MG capsule Take 1 capsule (100 mg total) by mouth 2 (two) times daily for 7 days.   pantoprazole  (PROTONIX ) 40 MG tablet Take 1 tablet (40 mg total) by mouth daily.   triamcinolone  (KENALOG ) 0.025 % cream Apply 1 Application topically 2 (two) times daily. As needed   valsartan  (DIOVAN ) 80 MG tablet Take 1 tablet (80 mg total) by mouth 2 (two) times daily.    Vitamin D , Ergocalciferol , (DRISDOL ) 1.25 MG (50000 UNIT) CAPS capsule TAKE 1 CAPSULE BY MOUTH EVERY 7 DAYS   [DISCONTINUED] benzonatate  (TESSALON ) 100 MG capsule Take 2 capsules (200 mg total) by mouth every 8 (eight) hours.   [DISCONTINUED] valsartan  (DIOVAN ) 40 MG tablet Take 1 tablet (40 mg total) by mouth daily. (Patient not taking: Reported on 03/26/2024)   No facility-administered encounter medications on file as of 04/05/2024.    Social History: Social History   Tobacco Use   Smoking status: Never   Smokeless tobacco: Never  Vaping Use   Vaping status: Never Used  Substance Use Topics   Alcohol use: Never   Drug use: Never    Family Medical History: Family History  Problem Relation Age of Onset   Hypertension Mother    Thyroid  disease Mother    Hypertension Brother    Diabetes Brother  Breast cancer Neg Hx    Ovarian cancer Neg Hx    Colon cancer Neg Hx     Physical Examination: Vitals:   04/05/24 1012  BP: 138/86    General: Patient is well developed, well nourished, calm, collected, and in no apparent distress. Attention to examination is appropriate.  Respiratory: Patient is breathing without any difficulty.   NEUROLOGICAL:     Awake, alert, oriented to person, place, and time.  Speech is clear and fluent. Fund of knowledge is appropriate.   Cranial Nerves: Pupils equal round and reactive to light.  Facial tone is symmetric.    No posterior lumbar tenderness.   No abnormal lesions on exposed skin.   Strength: Side Biceps Triceps Deltoid Interossei Grip Wrist Ext. Wrist Flex.  R 5 5 5 5 5 5 5   L 5 5 5 5 5 5 5    Side Iliopsoas Quads Hamstring PF DF EHL  R 5 5 5 5 5 5   L 5 5 5 5 5 5    Reflexes are 2+ and symmetric at the biceps, brachioradialis, patella and achilles.   Hoffman's is absent.  Clonus is not present.   Bilateral upper and lower extremity sensation is intact to light touch.     No pain with IR/ER of both hips.   Gait is normal.     Medical Decision Making  Imaging: Lumbar xrays dated 03/27/21;  Diffuse moderate lumbar spondylosis and DDD.  Report not available for above imaging.   Assessment and Plan: Ms. Baz has a history of chronic intermittent LBP that has been worse and more constant in last year.   She has more constant LBP that is described as a dull ache. No leg pain. No numbness, tingling, or weakness. Feels like she is hunching over when she walks. Grocery cart helps, but she still has LBP.   She has known diffuse moderate lumbar spondylosis and DDD.  Treatment options discussed with patient and following plan made:   - Discussed PT for lumbar spine. She declines.  - She would like to consider repeat lumbar injections.  - She is very claustrophobic and does not want to do an MRI of her lumbar spine.  - CT of lumbar spine ordered.  - Will schedule phone visit to review CT results once I get them back. Tentative plan is to refer her to Dr. Erman Hayward for injections.   I spent a total of 25 minutes in face-to-face and non-face-to-face activities related to this patient's care today including review of outside records, review of imaging, review of symptoms, physical exam, discussion of differential diagnosis, discussion of treatment options, and documentation.   Thank you for involving me in the care of this patient.   Lucetta Russel PA-C Dept. of Neurosurgery

## 2024-04-05 ENCOUNTER — Ambulatory Visit: Admitting: Orthopedic Surgery

## 2024-04-05 ENCOUNTER — Encounter: Payer: Self-pay | Admitting: Orthopedic Surgery

## 2024-04-05 VITALS — BP 138/86 | Ht 66.0 in | Wt 300.0 lb

## 2024-04-05 DIAGNOSIS — M5136 Other intervertebral disc degeneration, lumbar region with discogenic back pain only: Secondary | ICD-10-CM | POA: Diagnosis not present

## 2024-04-05 DIAGNOSIS — M47816 Spondylosis without myelopathy or radiculopathy, lumbar region: Secondary | ICD-10-CM | POA: Diagnosis not present

## 2024-04-05 DIAGNOSIS — G8929 Other chronic pain: Secondary | ICD-10-CM | POA: Diagnosis not present

## 2024-04-05 DIAGNOSIS — M545 Low back pain, unspecified: Secondary | ICD-10-CM

## 2024-04-05 NOTE — Patient Instructions (Signed)
 It was so nice to see you today. Thank you so much for coming in.    Your xrays from 2022 show wear and tear (arthritis) in his back.   I want to get a CT of your lower back to look into things further. We will get this approved through your insurance and [facility] will call you to schedule the appointment. Ask about your patient responsibility. You do not need to pay this prior to getting MRI, they can bill you.    Outpatient Imaging (building with the white pillars) is located off of [address]. The address is [address] [place], [place] [address].    After you have the CT, it takes 14-21 days for me to get the results back. Once I have them, we will call you to schedule a follow up phone visit with me to review them.   Depending on CT results, will likely refer you back to Dr. Erman Hayward to consider injections.   Please do not hesitate to call if you have any questions or concerns. You can also message me in MyChart.   Lucetta Russel PA-C [phone]     The physicians and staff at [facility] are committed to providing excellent care. You may receive a survey asking for feedback about your experience at our office. We value you your feedback and appreciate you taking the time to to fill it out. The [facility] leadership team is also available to discuss your experience in person, feel free to contact us  [phone].

## 2024-04-08 ENCOUNTER — Other Ambulatory Visit: Payer: Self-pay | Admitting: Family Medicine

## 2024-04-11 ENCOUNTER — Ambulatory Visit
Admission: RE | Admit: 2024-04-11 | Discharge: 2024-04-11 | Disposition: A | Discharge status: Home / Self Care | Source: Ambulatory Visit | Attending: Orthopedic Surgery | Admitting: Orthopedic Surgery

## 2024-04-11 ENCOUNTER — Other Ambulatory Visit: Payer: Self-pay | Admitting: Family Medicine

## 2024-04-11 DIAGNOSIS — M545 Low back pain, unspecified: Secondary | ICD-10-CM | POA: Diagnosis not present

## 2024-04-11 DIAGNOSIS — M47816 Spondylosis without myelopathy or radiculopathy, lumbar region: Secondary | ICD-10-CM | POA: Insufficient documentation

## 2024-04-11 DIAGNOSIS — M5136 Other intervertebral disc degeneration, lumbar region with discogenic back pain only: Secondary | ICD-10-CM | POA: Diagnosis not present

## 2024-04-11 DIAGNOSIS — M5126 Other intervertebral disc displacement, lumbar region: Secondary | ICD-10-CM | POA: Diagnosis not present

## 2024-04-11 DIAGNOSIS — G8929 Other chronic pain: Secondary | ICD-10-CM | POA: Diagnosis not present

## 2024-04-11 DIAGNOSIS — M48061 Spinal stenosis, lumbar region without neurogenic claudication: Secondary | ICD-10-CM | POA: Diagnosis not present

## 2024-04-16 ENCOUNTER — Encounter: Payer: Self-pay | Admitting: Family Medicine

## 2024-04-16 ENCOUNTER — Ambulatory Visit (INDEPENDENT_AMBULATORY_CARE_PROVIDER_SITE_OTHER): Admitting: Family Medicine

## 2024-04-16 VITALS — BP 163/68 | HR 77 | Resp 16 | Wt 305.1 lb

## 2024-04-16 DIAGNOSIS — I10 Essential (primary) hypertension: Secondary | ICD-10-CM | POA: Diagnosis not present

## 2024-04-16 DIAGNOSIS — R600 Localized edema: Secondary | ICD-10-CM

## 2024-04-16 MED ORDER — TORSEMIDE 5 MG PO TABS: 5.0000 mg | ORAL_TABLET | Freq: Every day | ORAL | 0 refills | Status: DC

## 2024-04-16 MED ORDER — METOLAZONE 2.5 MG PO TABS: 2.5000 mg | ORAL_TABLET | Freq: Every day | ORAL | 1 refills | Status: DC

## 2024-04-16 MED ORDER — AMLODIPINE BESYLATE 2.5 MG PO TABS: 2.5000 mg | ORAL_TABLET | Freq: Every day | ORAL | 0 refills | Status: DC

## 2024-04-16 NOTE — Patient Instructions (Signed)
 Marland Kitchen  Please review the attached list of medications and notify my office if there are any errors.   . Please bring all of your medications to every appointment so we can make sure that our medication list is the same as yours.

## 2024-04-16 NOTE — Progress Notes (Signed)
 Established patient visit   Patient: Kaylee Reyes   DOB: [birth year]   73 y.o. Female  MRN: [id number] Visit Date: 04/16/2024  Today's healthcare provider: Jeralene Mom, MD   Chief Complaint  Patient presents with   Medical Management of Chronic Issues    Blood Pressure follow-up   Subjective    HPI Follow up BP medications. Was taking off of amlodipine  and increase dose of valsartan  due to edema at 03/12/24 visit with Janna Ostwalt, PA-C.  Is taking valsartan  80mg  BID and back on low dose of amlodipine  2.5 with some swelling in feet.   Wt Readings from Last 3 Encounters:  04/16/24 (!) 305 lb 1.6 oz (138.4 kg)  04/05/24 300 lb (136.1 kg)  03/26/24 297 lb (134.7 kg)     Medications: Outpatient Medications Prior to Visit  Medication Sig   B Complex Vitamins (VITAMIN-B COMPLEX PO) Take by mouth.   Calcium  Carbonate Antacid (TUMS PO) Take by mouth as needed.   Carboxymeth-Glycerin-Polysorb (REFRESH OPTIVE MEGA-3 OP) Place 1 drop into both eyes as needed.   docusate sodium  (COLACE) 100 MG capsule Take 200 mg by mouth daily. 2 in am, 1 at night   fexofenadine (ALLEGRA) 60 MG tablet Take 60 mg by mouth daily.   fluticasone  (FLONASE ) 50 MCG/ACT nasal spray SHAKE LIQUID AND USE 1 SPRAY IN EACH NOSTRIL DAILY   ketoconazole  (NIZORAL ) 2 % cream Apply topically daily as needed for irritation.   levothyroxine  (SYNTHROID ) 75 MCG tablet TAKE 1 TABLET DAILY   meclizine  (ANTIVERT ) 12.5 MG tablet TAKE 1 TABLET(12.5 MG) BY MOUTH THREE TIMES DAILY AS NEEDED FOR DIZZINESS   pantoprazole  (PROTONIX ) 40 MG tablet Take 1 tablet (40 mg total) by mouth daily.   triamcinolone  (KENALOG ) 0.025 % cream Apply 1 Application topically 2 (two) times daily. As needed   valsartan  (DIOVAN ) 80 MG tablet Take 1 tablet (80 mg total) by mouth 2 (two) times daily.   Vitamin D , Ergocalciferol , (DRISDOL ) 1.25 MG (50000 UNIT) CAPS capsule TAKE 1 CAPSULE BY MOUTH EVERY 7 DAYS   [DISCONTINUED] amLODipine  (NORVASC )  2.5 MG tablet Take 1 tablet (2.5 mg total) by mouth daily.   ipratropium (ATROVENT ) 0.06 % nasal spray Place 2 sprays into both nostrils 4 (four) times daily.   mupirocin  cream (BACTROBAN ) 2 % Apply 1 Application topically 2 (two) times daily.   No facility-administered medications prior to visit.        Objective    BP (!) 163/68 (BP Location: Left Wrist, Patient Position: Sitting, Cuff Size: Large)   Pulse 77   Resp 16   Wt (!) 305 lb 1.6 oz (138.4 kg)   SpO2 97%   BMI 49.24 kg/m    Physical Exam   General: Appearance:    Obese female in no acute distress  Eyes:    PERRL, conjunctiva/corneas clear, EOM's intact       Lungs:     Clear to auscultation bilaterally, respirations unlabored  Heart:    Normal heart rate. Normal rhythm. No murmurs, rubs, or gallops.    MS:   All extremities are intact.  2+ bipedal edema. Moderate varicosities both lower legs.    Neurologic:   Awake, alert, oriented x 3. No apparent focal neurological defect.         Assessment & Plan     1. Primary hypertension (Primary) Back on low dose amlodipine , but dosing limited by LE edmea. As below. Will add torsemide 5 MG tablet; Take 1 tablet (  6 mg total) by mouth daily.  Dispense: 30 tablet; Refill: 1 which should reduced SBP and allow up titrated of amlodipine  if needed.   2. Peripheral edema Worse since restarting amlodipine  as above. Starting low dose torsemide as above  Return in about 3 weeks (around 05/07/2024) for Hypertension.         Jeralene Mom, MD  [facility] [phone] (phone) [phone] (fax)  [facility]

## 2024-04-25 DIAGNOSIS — K760 Fatty (change of) liver, not elsewhere classified: Secondary | ICD-10-CM | POA: Diagnosis not present

## 2024-04-25 DIAGNOSIS — R1012 Left upper quadrant pain: Secondary | ICD-10-CM | POA: Diagnosis not present

## 2024-04-25 DIAGNOSIS — R110 Nausea: Secondary | ICD-10-CM | POA: Diagnosis not present

## 2024-04-25 DIAGNOSIS — K449 Diaphragmatic hernia without obstruction or gangrene: Secondary | ICD-10-CM | POA: Diagnosis not present

## 2024-04-25 DIAGNOSIS — K5909 Other constipation: Secondary | ICD-10-CM | POA: Diagnosis not present

## 2024-04-25 DIAGNOSIS — Z8719 Personal history of other diseases of the digestive system: Secondary | ICD-10-CM | POA: Diagnosis not present

## 2024-04-25 DIAGNOSIS — K219 Gastro-esophageal reflux disease without esophagitis: Secondary | ICD-10-CM | POA: Diagnosis not present

## 2024-04-25 DIAGNOSIS — R143 Flatulence: Secondary | ICD-10-CM | POA: Diagnosis not present

## 2024-04-25 DIAGNOSIS — Z6841 Body Mass Index (BMI) 40.0 and over, adult: Secondary | ICD-10-CM | POA: Diagnosis not present

## 2024-04-25 DIAGNOSIS — R1013 Epigastric pain: Secondary | ICD-10-CM | POA: Diagnosis not present

## 2024-04-25 NOTE — Progress Notes (Signed)
 Telephone Visit- Progress Note: Referring Physician:  Lamon Pillow, MD [address] [place],  [place] [address]  Primary Physician:  Lamon Pillow, MD  This visit was performed via telephone.  Patient location: home Provider location: office  I spent a total of 15 minutes non-face-to-face activities for this visit on the date of this encounter including review of current clinical condition and response to treatment.    Patient has given verbal consent to this telephone visits and we reviewed the limitations of a telephone visit. Patient wishes to proceed.    Chief Complaint:  review imaging  History of Present Illness: Kaylee Reyes is a 73 y.o. female has a history of HTN, GERD, fatty liver, h/o acute pancreatitis, hypothyroidism, breast CA, chem induced neuropathy, obesity, gastritis.   She has history of chronic intermittent LBP that has been worse and more constant in last year. She has known diffuse moderate lumbar spondylosis and DDD.   Phone visit scheduled to review her lumbar CT scan.   Her pain has not changed. She continues with  more constant LBP with no leg pain. Went to a graduation this week and had to do a lot of walking which made her pain worse. She has no numbness, tingling, or weakness. Pain is worse with prolonged standing and walking. Pain is better with sitting in her recliner. Grocery cart helps, but she still has LBP.    Care with NSAIDs due to gastritis.    She does not smoke.    Bowel/Bladder Dysfunction: none   Conservative measures:  Physical therapy:  has not participated in PT recently, did not help in the past.  Multimodal medical therapy including regular antiinflammatories: none  Injections:  07/31/2019 L5-S1 ESI that helped.    Past Surgery: no spinal surgeries    Kaylee Reyes has no symptoms of cervical myelopathy.   The symptoms are causing a significant impact on the patient's life.    Exam: No exam  done as this was a telephone encounter.     Imaging: CT lumbar spine dated 04/11/24:  FINDINGS: Segmentation: 5 lumbar type vertebrae.   Alignment: Slight L4-5 anterolisthesis.   Vertebrae: No acute fracture or focal pathologic process. Bridging osteophytes spanning T11-L1. Ankylosis at L5-S1. Subjective osteopenia which is generalized.   Paraspinal and other soft tissues: No dense of perispinal mass or inflammation. 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   Disc levels:   T12- L1: Bridging endplate osteophytes.  No bony impingement   L1-L2: Bulky ventral spondylitic spurring.   L2-L3: Disc narrowing with gas containing fissure and endplate degeneration. Chronic protrusion with bulky ossification causes severe spinal stenosis and conserve with ligamentum flavum thickening. The foramina are patent. Degenerative facet spurring is mild.   L3-L4: Disc narrowing and bulging with endplate ridging eccentric to the right. Moderate degenerative facet spurring. Mild appearing spinal stenosis.   L4-L5: Facet osteoarthritis with bulky spurring and ligamentum flavum thickening with calcification. The disc is narrowed and bulging with gas containing fissure and endplate ridging. Moderate or advanced spinal stenosis. Patent foramina.   L5-S1:Intervertebral ankylosis.  No bony impingement.   IMPRESSION: Advanced and generalized lumbar spine degeneration with high-grade spinal stenosis at L2-3 and L4-5 that is progressed from a 2019 lumbar MRI.   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     Electronically Signed   By: Ronnette Coke M.D.   On: 04/24/2024 09:44  I have personally reviewed the images and agree  with the above interpretation.  Assessment and Plan: [name] has a history of chronic intermittent LBP that has been worse and more constant in last year.   She continues with  more constant LBP with no leg pain. She has no numbness,  tingling, or weakness. Pain is worse with prolonged standing and walking.    She has known diffuse moderate/severe lumbar spondylosis and DDD. She has severe central stenosis L2-L3, moderate central stenosis L3-L4, and moderate/severe central stenosis L4-L5.    Treatment options discussed with patient and following plan made:   - Referral back to PMR ([facility]) to discuss lumbar injections. - Discussed PT for lumbar spine. She declines.  - Care with NSAIDs due to gastritis.  - If no improvement with injections, may need to consider surgery options. She would need to revisit PT and get lumbar MRI (she is very claustrophobic). May need to discuss weight loss as well (BMI is 49).  - Follow up with me in 8 weeks. Will discuss with her daughters and I can call them if they have further questions.   Lucetta Russel PA-C Neurosurgery

## 2024-04-26 ENCOUNTER — Other Ambulatory Visit: Payer: Self-pay | Admitting: Gastroenterology

## 2024-04-26 DIAGNOSIS — R1013 Epigastric pain: Secondary | ICD-10-CM

## 2024-04-26 DIAGNOSIS — Z8719 Personal history of other diseases of the digestive system: Secondary | ICD-10-CM

## 2024-04-26 DIAGNOSIS — R110 Nausea: Secondary | ICD-10-CM

## 2024-04-26 DIAGNOSIS — R1012 Left upper quadrant pain: Secondary | ICD-10-CM

## 2024-04-27 ENCOUNTER — Ambulatory Visit (INDEPENDENT_AMBULATORY_CARE_PROVIDER_SITE_OTHER): Admitting: Orthopedic Surgery

## 2024-04-27 ENCOUNTER — Encounter: Payer: Self-pay | Admitting: Orthopedic Surgery

## 2024-04-27 DIAGNOSIS — M48061 Spinal stenosis, lumbar region without neurogenic claudication: Secondary | ICD-10-CM | POA: Diagnosis not present

## 2024-04-27 DIAGNOSIS — M47816 Spondylosis without myelopathy or radiculopathy, lumbar region: Secondary | ICD-10-CM

## 2024-04-27 DIAGNOSIS — M5136 Other intervertebral disc degeneration, lumbar region with discogenic back pain only: Secondary | ICD-10-CM | POA: Diagnosis not present

## 2024-05-02 ENCOUNTER — Other Ambulatory Visit: Payer: Self-pay | Admitting: Neurosurgery

## 2024-05-02 DIAGNOSIS — M5136 Other intervertebral disc degeneration, lumbar region with discogenic back pain only: Secondary | ICD-10-CM

## 2024-05-02 DIAGNOSIS — M48061 Spinal stenosis, lumbar region without neurogenic claudication: Secondary | ICD-10-CM

## 2024-05-02 DIAGNOSIS — G8929 Other chronic pain: Secondary | ICD-10-CM

## 2024-05-02 DIAGNOSIS — M47816 Spondylosis without myelopathy or radiculopathy, lumbar region: Secondary | ICD-10-CM

## 2024-05-07 ENCOUNTER — Encounter: Payer: Self-pay | Admitting: Family Medicine

## 2024-05-07 ENCOUNTER — Ambulatory Visit (INDEPENDENT_AMBULATORY_CARE_PROVIDER_SITE_OTHER): Admitting: Family Medicine

## 2024-05-07 VITALS — BP 146/64 | HR 86 | Wt 304.7 lb

## 2024-05-07 DIAGNOSIS — I10 Essential (primary) hypertension: Secondary | ICD-10-CM

## 2024-05-07 DIAGNOSIS — R600 Localized edema: Secondary | ICD-10-CM | POA: Diagnosis not present

## 2024-05-07 NOTE — Progress Notes (Signed)
 Established patient visit   Patient: Kaylee Reyes   DOB: 04-01-1951   73 y.o. Female  MRN: [id number] Visit Date: 05/07/2024  Today's healthcare provider: Jeralene Mom, MD   Chief Complaint  Patient presents with   Medical Management of Chronic Issues    HTN follow-up   Subjective    HPI Follow up hypertension since adding torsemide  to low dose amlodipine  3 weeks due to edema. Edema has improved a bit, but not resolved. Home BP slightly improved, mostly SBP in the 140s and 150s. Was very fatigued when started on torsemide , but somewhat better now.   BP Readings from Last 2 Encounters:  05/07/24 (!) 146/64  04/16/24 (!) 163/68   Lab Results  Component Value Date   NA 140 03/26/2024   K 4.3 03/26/2024   CREATININE 0.87 03/26/2024   EGFR 71 03/26/2024   GLUCOSE 105 (H) 03/26/2024      Medications: Outpatient Medications Prior to Visit  Medication Sig   amLODipine  (NORVASC ) 2.5 MG tablet Take 1 tablet (2.5 mg total) by mouth daily.   B Complex Vitamins (VITAMIN-B COMPLEX PO) Take by mouth.   Calcium  Carbonate Antacid (TUMS PO) Take by mouth as needed.   Carboxymeth-Glycerin-Polysorb (REFRESH OPTIVE MEGA-3 OP) Place 1 drop into both eyes as needed.   docusate sodium  (COLACE) 100 MG capsule Take 200 mg by mouth daily. 2 in am, 1 at night   fexofenadine (ALLEGRA) 60 MG tablet Take 60 mg by mouth daily.   fluticasone  (FLONASE ) 50 MCG/ACT nasal spray SHAKE LIQUID AND USE 1 SPRAY IN EACH NOSTRIL DAILY   ketoconazole  (NIZORAL ) 2 % cream Apply topically daily as needed for irritation.   levothyroxine  (SYNTHROID ) 75 MCG tablet TAKE 1 TABLET DAILY   meclizine  (ANTIVERT ) 12.5 MG tablet TAKE 1 TABLET(12.5 MG) BY MOUTH THREE TIMES DAILY AS NEEDED FOR DIZZINESS   pantoprazole  (PROTONIX ) 40 MG tablet Take 1 tablet (40 mg total) by mouth daily.   torsemide  (DEMADEX ) 5 MG tablet Take 1 tablet (5 mg total) by mouth daily.   triamcinolone  (KENALOG ) 0.025 % cream Apply 1  Application topically 2 (two) times daily. As needed   valsartan  (DIOVAN ) 80 MG tablet Take 1 tablet (80 mg total) by mouth 2 (two) times daily.   Vitamin D , Ergocalciferol , (DRISDOL ) 1.25 MG (50000 UNIT) CAPS capsule TAKE 1 CAPSULE BY MOUTH EVERY 7 DAYS   No facility-administered medications prior to visit.    Review of Systems     Objective    BP (!) 146/64 (BP Location: Left Wrist, Patient Position: Sitting, Cuff Size: Large)   Pulse 86   Wt (!) 304 lb 11.2 oz (138.2 kg)   SpO2 98%   BMI 49.18 kg/m    Physical Exam   General: Appearance:    Obese female in no acute distress  Eyes:    PERRL, conjunctiva/corneas clear, EOM's intact       Lungs:     Clear to auscultation bilaterally, respirations unlabored  Heart:    Normal heart rate. Normal rhythm. No murmurs, rubs, or gallops.    MS:   All extremities are intact.  2+ bipedal edema.   Neurologic:   Awake, alert, oriented x 3. No apparent focal neurological defect.          Assessment & Plan     1. Essential hypertension (Primary) Marginally improved since adding torsemide . Amlodipine  dose limited by edema.   - Renal function panel - Magnesium  - Cortisol  Anticipate increase  valsartan  or torsemide  depending on labs.   2. Peripheral edema Slightly better with low dose torsemide . Consider increasing dose after reviewing labs.           Jeralene Mom, MD  [facility] [phone] (phone) [phone] (fax)  [facility]

## 2024-05-08 ENCOUNTER — Encounter: Payer: Self-pay | Admitting: Family Medicine

## 2024-05-08 ENCOUNTER — Ambulatory Visit: Payer: Self-pay | Admitting: Family Medicine

## 2024-05-08 DIAGNOSIS — R600 Localized edema: Secondary | ICD-10-CM

## 2024-05-08 DIAGNOSIS — I10 Essential (primary) hypertension: Secondary | ICD-10-CM

## 2024-05-08 LAB — MAGNESIUM: Magnesium: 1.8 mg/dL (ref 1.6–2.3)

## 2024-05-08 LAB — RENAL FUNCTION PANEL
Albumin: 4.3 g/dL (ref 3.8–4.8)
BUN/Creatinine Ratio: 14 (ref 12–28)
BUN: 13 mg/dL (ref 8–27)
CO2: 22 mmol/L (ref 20–29)
Calcium: 9.3 mg/dL (ref 8.7–10.3)
Chloride: 102 mmol/L (ref 96–106)
Creatinine, Ser: 0.95 mg/dL (ref 0.57–1.00)
Glucose: 95 mg/dL (ref 70–99)
Phosphorus: 2.9 mg/dL — ABNORMAL LOW (ref 3.0–4.3)
Potassium: 4.2 mmol/L (ref 3.5–5.2)
Sodium: 141 mmol/L (ref 134–144)
eGFR: 64 mL/min/{1.73_m2} (ref >59)

## 2024-05-08 LAB — CORTISOL: Cortisol: 7.3 ug/dL (ref 6.2–19.4)

## 2024-05-08 MED ORDER — TORSEMIDE 10 MG PO TABS: 10.0000 mg | ORAL_TABLET | Freq: Every day | ORAL | 1 refills | Status: DC

## 2024-05-09 DIAGNOSIS — M4726 Other spondylosis with radiculopathy, lumbar region: Secondary | ICD-10-CM | POA: Diagnosis not present

## 2024-05-09 DIAGNOSIS — M48062 Spinal stenosis, lumbar region with neurogenic claudication: Secondary | ICD-10-CM | POA: Diagnosis not present

## 2024-05-11 ENCOUNTER — Other Ambulatory Visit: Payer: Self-pay | Admitting: Gastroenterology

## 2024-05-11 DIAGNOSIS — Z8719 Personal history of other diseases of the digestive system: Secondary | ICD-10-CM

## 2024-05-11 DIAGNOSIS — R1012 Left upper quadrant pain: Secondary | ICD-10-CM

## 2024-05-11 DIAGNOSIS — R1013 Epigastric pain: Secondary | ICD-10-CM

## 2024-05-11 DIAGNOSIS — K219 Gastro-esophageal reflux disease without esophagitis: Secondary | ICD-10-CM

## 2024-05-11 DIAGNOSIS — R110 Nausea: Secondary | ICD-10-CM

## 2024-05-14 ENCOUNTER — Ambulatory Visit
Admission: RE | Admit: 2024-05-14 | Discharge: 2024-05-14 | Disposition: A | Discharge status: Home / Self Care | Source: Ambulatory Visit | Attending: Gastroenterology | Admitting: Gastroenterology

## 2024-05-14 ENCOUNTER — Other Ambulatory Visit: Payer: Self-pay | Admitting: Family Medicine

## 2024-05-14 DIAGNOSIS — R1012 Left upper quadrant pain: Secondary | ICD-10-CM | POA: Insufficient documentation

## 2024-05-14 DIAGNOSIS — Z8719 Personal history of other diseases of the digestive system: Secondary | ICD-10-CM | POA: Insufficient documentation

## 2024-05-14 DIAGNOSIS — R1013 Epigastric pain: Secondary | ICD-10-CM | POA: Insufficient documentation

## 2024-05-14 DIAGNOSIS — K219 Gastro-esophageal reflux disease without esophagitis: Secondary | ICD-10-CM | POA: Insufficient documentation

## 2024-05-14 DIAGNOSIS — R110 Nausea: Secondary | ICD-10-CM | POA: Insufficient documentation

## 2024-05-14 DIAGNOSIS — K224 Dyskinesia of esophagus: Secondary | ICD-10-CM | POA: Diagnosis not present

## 2024-05-14 DIAGNOSIS — K449 Diaphragmatic hernia without obstruction or gangrene: Secondary | ICD-10-CM | POA: Diagnosis not present

## 2024-05-15 ENCOUNTER — Ambulatory Visit
Admission: RE | Admit: 2024-05-15 | Discharge: 2024-05-15 | Disposition: A | Discharge status: Home / Self Care | Source: Ambulatory Visit | Attending: Gastroenterology | Admitting: Gastroenterology

## 2024-05-15 DIAGNOSIS — Z8719 Personal history of other diseases of the digestive system: Secondary | ICD-10-CM | POA: Diagnosis not present

## 2024-05-15 DIAGNOSIS — R1012 Left upper quadrant pain: Secondary | ICD-10-CM | POA: Diagnosis not present

## 2024-05-15 DIAGNOSIS — R1013 Epigastric pain: Secondary | ICD-10-CM | POA: Diagnosis not present

## 2024-05-15 DIAGNOSIS — R110 Nausea: Secondary | ICD-10-CM | POA: Diagnosis not present

## 2024-05-15 MED ORDER — IOHEXOL 300 MG/ML  SOLN
100.0000 mL | Freq: Once | INTRAMUSCULAR | Status: AC | PRN
  Administered 2024-05-15: 100 mL via INTRAVENOUS

## 2024-05-17 DIAGNOSIS — M5416 Radiculopathy, lumbar region: Secondary | ICD-10-CM | POA: Diagnosis not present

## 2024-05-31 ENCOUNTER — Ambulatory Visit
Admission: RE | Admit: 2024-05-31 | Discharge: 2024-05-31 | Disposition: A | Discharge status: Home / Self Care | Source: Ambulatory Visit | Attending: Gastroenterology | Admitting: Gastroenterology

## 2024-05-31 DIAGNOSIS — R110 Nausea: Secondary | ICD-10-CM | POA: Insufficient documentation

## 2024-05-31 DIAGNOSIS — R1012 Left upper quadrant pain: Secondary | ICD-10-CM | POA: Insufficient documentation

## 2024-05-31 DIAGNOSIS — Z8719 Personal history of other diseases of the digestive system: Secondary | ICD-10-CM | POA: Diagnosis not present

## 2024-05-31 DIAGNOSIS — R162 Hepatomegaly with splenomegaly, not elsewhere classified: Secondary | ICD-10-CM | POA: Diagnosis not present

## 2024-05-31 DIAGNOSIS — K5730 Diverticulosis of large intestine without perforation or abscess without bleeding: Secondary | ICD-10-CM | POA: Diagnosis not present

## 2024-05-31 DIAGNOSIS — R1013 Epigastric pain: Secondary | ICD-10-CM | POA: Diagnosis not present

## 2024-05-31 DIAGNOSIS — R1010 Upper abdominal pain, unspecified: Secondary | ICD-10-CM | POA: Diagnosis not present

## 2024-05-31 DIAGNOSIS — N281 Cyst of kidney, acquired: Secondary | ICD-10-CM | POA: Diagnosis not present

## 2024-05-31 MED ORDER — IOHEXOL 300 MG/ML  SOLN
100.0000 mL | Freq: Once | INTRAMUSCULAR | Status: AC | PRN
  Administered 2024-05-31: 100 mL via INTRAVENOUS

## 2024-06-06 ENCOUNTER — Ambulatory Visit: Admitting: Family Medicine

## 2024-06-11 DIAGNOSIS — M48062 Spinal stenosis, lumbar region with neurogenic claudication: Secondary | ICD-10-CM | POA: Diagnosis not present

## 2024-06-11 DIAGNOSIS — M5416 Radiculopathy, lumbar region: Secondary | ICD-10-CM | POA: Diagnosis not present

## 2024-06-15 ENCOUNTER — Ambulatory Visit (INDEPENDENT_AMBULATORY_CARE_PROVIDER_SITE_OTHER): Admitting: Family Medicine

## 2024-06-15 VITALS — BP 140/70 | HR 76 | Temp 98.3°F | Ht 66.0 in | Wt 296.0 lb

## 2024-06-15 DIAGNOSIS — I10 Essential (primary) hypertension: Secondary | ICD-10-CM

## 2024-06-15 DIAGNOSIS — M5136 Other intervertebral disc degeneration, lumbar region with discogenic back pain only: Secondary | ICD-10-CM | POA: Diagnosis not present

## 2024-06-15 DIAGNOSIS — R600 Localized edema: Secondary | ICD-10-CM

## 2024-06-15 DIAGNOSIS — R0609 Other forms of dyspnea: Secondary | ICD-10-CM

## 2024-06-15 DIAGNOSIS — E7800 Pure hypercholesterolemia, unspecified: Secondary | ICD-10-CM | POA: Diagnosis not present

## 2024-06-15 NOTE — Progress Notes (Signed)
 Established patient visit   Patient: Kaylee Reyes   DOB: Apr 28, 1951   73 y.o. Female  MRN: [id number] Visit Date: 06/15/2024  Today's healthcare provider: Nancyann Perry, MD   Chief Complaint  Patient presents with   Hypertension    Patient was last seen on 05/07/24.  Patient was started on Torsemide  in May and then it was increased to 10 mg daily in June.  Patient brings in her blood pressure readings from home and they are ranging 1360's-150's over 40's-74.  Patient does report being a little more winded at times. Patient's weight has gone from 304 to 296 today.   Subjective    HPI Discussed the use of AI scribe software for clinical note transcription with the patient, who gave verbal consent to proceed.  History of Present Illness   Kaylee Reyes is a 73 year old female with hypertension who presents for follow-up of blood pressure and LE edema. She has increase dose of torsemide  since last visit  She has been experiencing shortness of breath since starting a diuretic, which she refers to as a 'fluid pill'. This symptom occurs particularly with exertion. No dizziness or lightheadedness, but she experiences nausea, which she attributes to gastrointestinal issues. She has been using meclizine  more frequently to manage symptoms.  She has been working on weight loss since being informed of fatty liver changes by her gastroenterologist. Her weight has decreased from 308 pounds to 304 pounds over the past month.  She has a history of back issues, including spinal narrowing and arthritis, for which she received two injections a month ago without relief. She is considering glucosamine for joint support and is cautious about potential interactions with her blood pressure medications.  She stopped taking magnesium  due to uncertainty about the product she purchased. She is also considering turmeric and red yeast rice for cholesterol management, with an awareness of potential joint pain  from statins, which she cannot tolerate.     Lab Results  Component Value Date   NA 141 05/07/2024   K 4.2 05/07/2024   CREATININE 0.95 05/07/2024   EGFR 64 05/07/2024   GLUCOSE 95 05/07/2024     Medications: Outpatient Medications Prior to Visit  Medication Sig   amLODipine  (NORVASC ) 2.5 MG tablet Take 1 tablet (2.5 mg total) by mouth daily.   Carboxymeth-Glycerin-Polysorb (REFRESH OPTIVE MEGA-3 OP) Place 1 drop into both eyes as needed.   docusate sodium  (COLACE) 100 MG capsule Take 200 mg by mouth daily. 2 in am, 1 at night   fexofenadine (ALLEGRA) 60 MG tablet Take 60 mg by mouth daily.   ketoconazole  (NIZORAL ) 2 % cream Apply topically daily as needed for irritation.   levothyroxine  (SYNTHROID ) 75 MCG tablet TAKE 1 TABLET DAILY   meclizine  (ANTIVERT ) 12.5 MG tablet TAKE 1 TABLET(12.5 MG) BY MOUTH THREE TIMES DAILY AS NEEDED FOR DIZZINESS   pantoprazole  (PROTONIX ) 40 MG tablet Take 1 tablet (40 mg total) by mouth daily.   torsemide  (DEMADEX ) 10 MG tablet Take 1 tablet (10 mg total) by mouth daily.   triamcinolone  (KENALOG ) 0.025 % cream Apply 1 Application topically 2 (two) times daily. As needed   valsartan  (DIOVAN ) 80 MG tablet Take 1 tablet (80 mg total) by mouth 2 (two) times daily.   Vitamin D , Ergocalciferol , (DRISDOL ) 1.25 MG (50000 UNIT) CAPS capsule TAKE 1 CAPSULE BY MOUTH EVERY 7 DAYS   B Complex Vitamins (VITAMIN-B COMPLEX PO) Take by mouth. (Patient not taking: Reported on 06/15/2024)  Calcium  Carbonate Antacid (TUMS PO) Take by mouth as needed. (Patient not taking: Reported on 06/15/2024)   fluticasone  (FLONASE ) 50 MCG/ACT nasal spray SHAKE LIQUID AND USE 1 SPRAY IN EACH NOSTRIL DAILY (Patient not taking: Reported on 06/15/2024)   No facility-administered medications prior to visit.    Review of Systems  Constitutional:  Negative for appetite change, chills, fatigue and fever.  Respiratory:  Negative for chest tightness and shortness of breath.   Cardiovascular:   Negative for chest pain and palpitations.  Gastrointestinal:  Negative for abdominal pain, nausea and vomiting.  Neurological:  Negative for dizziness and weakness.       Objective    BP (!) 140/70 (BP Location: Left Wrist, Patient Position: Sitting, Cuff Size: Large)   Pulse 76   Temp 98.3 F (36.8 C) (Oral)   Ht 5' 6 (1.676 m)   Wt 296 lb (134.3 kg)   SpO2 98%   BMI 47.78 kg/m    Physical Exam    General: Appearance:    Obese female in no acute distress  Eyes:    PERRL, conjunctiva/corneas clear, EOM's intact       Lungs:     Clear to auscultation bilaterally, respirations unlabored  Heart:    Normal heart rate. Normal rhythm. No murmurs, rubs, or gallops.    MS:   All extremities are intact.  Trace bilateral LE edema.   Neurologic:   Awake, alert, oriented x 3. No apparent focal neurological defect.         Assessment & Plan     1. Primary hypertension (Primary) Much better with addition and increase in dose of torsemide . Continue current medications.   - Renal function panel  2. Dyspnea on exertion Normal cardiopulmonary exam and oximetry.  - CBC - Brain natriuretic peptide  3. Pedal edema Much better on current dose of torsemide .   4. Pure hypercholesterolemia Intolerant to multiple statins which caused joints aches. She is going to try red yeast rice extract & CoQ10   5. Degeneration of intervertebral disc of lumbar region with discogenic back pain  She is going to try  - Misc Natural Products (GLUCOSAMINE CHONDROITIN MSM) TABS; Take by mouth.         Nancyann Perry, MD  [facility] [phone] (phone) [phone] (fax)  [facility]

## 2024-06-17 ENCOUNTER — Other Ambulatory Visit: Payer: Self-pay | Admitting: Physician Assistant

## 2024-06-17 DIAGNOSIS — I10 Essential (primary) hypertension: Secondary | ICD-10-CM

## 2024-06-17 LAB — RENAL FUNCTION PANEL
Albumin: 4.3 g/dL (ref 3.8–4.8)
BUN/Creatinine Ratio: 14 (ref 12–28)
BUN: 15 mg/dL (ref 8–27)
CO2: 19 mmol/L — ABNORMAL LOW (ref 20–29)
Calcium: 9.3 mg/dL (ref 8.7–10.3)
Chloride: 101 mmol/L (ref 96–106)
Creatinine, Ser: 1.06 mg/dL — ABNORMAL HIGH (ref 0.57–1.00)
Glucose: 98 mg/dL (ref 70–99)
Phosphorus: 2.7 mg/dL — ABNORMAL LOW (ref 3.0–4.3)
Potassium: 4.4 mmol/L (ref 3.5–5.2)
Sodium: 140 mmol/L (ref 134–144)
eGFR: 56 mL/min/1.73 — ABNORMAL LOW (ref >59)

## 2024-06-17 LAB — CBC
Hematocrit: 35.2 % (ref 34.0–46.6)
Hemoglobin: 11.2 g/dL (ref 11.1–15.9)
MCH: 28.3 pg (ref 26.6–33.0)
MCHC: 31.8 g/dL (ref 31.5–35.7)
MCV: 89 fL (ref 79–97)
Platelets: 202 x10E3/uL (ref 150–450)
RBC: 3.96 x10E6/uL (ref 3.77–5.28)
RDW: 14.1 % (ref 11.7–15.4)
WBC: 6.4 x10E3/uL (ref 3.4–10.8)

## 2024-06-17 LAB — BRAIN NATRIURETIC PEPTIDE: BNP: 7 pg/mL (ref 0.0–100.0)

## 2024-06-18 ENCOUNTER — Ambulatory Visit: Payer: Self-pay | Admitting: Family Medicine

## 2024-06-19 NOTE — Telephone Encounter (Signed)
 Labs are in date.  Requested Prescriptions  Pending Prescriptions Disp Refills   valsartan  (DIOVAN ) 80 MG tablet [Pharmacy Med Name: VALSARTAN  80MG  TABLETS] 180 tablet 0    Sig: TAKE 1 TABLET(80 MG) BY MOUTH TWICE DAILY     Cardiovascular:  Angiotensin Receptor Blockers Failed - 06/19/2024  9:32 AM      Failed - Cr in normal range and within 180 days    Creatinine  Date Value Ref Range Status  07/15/2013 0.98 0.60 - 1.30 mg/dL Final   Creatinine, Ser  Date Value Ref Range Status  06/15/2024 1.06 (H) 0.57 - 1.00 mg/dL Final         Failed - Last BP in normal range    BP Readings from Last 1 Encounters:  06/15/24 (!) 140/70         Passed - K in normal range and within 180 days    Potassium  Date Value Ref Range Status  06/15/2024 4.4 3.5 - 5.2 mmol/L Final  07/15/2013 4.1 3.5 - 5.1 mmol/L Final         Passed - Patient is not pregnant      Passed - Valid encounter within last 6 months    Recent Outpatient Visits           4 days ago Primary hypertension   [facility] Nancyann BRAVO, MD   1 month ago Essential hypertension   [place] [facility] Nancyann BRAVO, MD   2 months ago Primary hypertension   [place] [facility] Nancyann BRAVO, MD   2 months ago Dysuria   [place] [facility] Simmons-Robinson, [place], MD   3 months ago Primary hypertension   [place] [facility] Butler, Janna, PA-C

## 2024-06-22 NOTE — Progress Notes (Signed)
 Referring Physician:  Gasper Nancyann BRAVO, MD [address] [place],  [place] [address]  Primary Physician:  Gasper Nancyann BRAVO, MD  History of Present Illness: 06/26/2024 Ms. Adisa Vigeant has a history of HTN, GERD, fatty liver, h/o acute pancreatitis, hypothyroidism, breast CA, chem induced neuropathy, obesity, gastritis.   She has history of chronic intermittent LBP that has been worse and more constant in last year. She has known diffuse moderate lumbar spondylosis and DDD.   Last did phone visit with me on 04/27/24. She has known diffuse moderate/severe lumbar spondylosis and DDD. She has severe central stenosis L2-L3, moderate central stenosis L3-L4, and moderate/severe central stenosis L4-L5.   She was sent to PMR to discuss injections.   She had bilateral L5-S1 TF ESI on 05/17/24 with no improvement. She declined PT and was going to increase her daily walking.   She is here for follow up.   She continues with more constant LBP with no leg pain. She has intermittent muscle cramps in her thighs. She has no numbness, tingling, or weakness. Pain is worse with prolonged standing and walking. Pain is better with sitting in her recliner.    Care with NSAIDs due to gastritis. She has started tumeric in last week.    She does not smoke.    Bowel/Bladder Dysfunction: none   Conservative measures:  Physical therapy:  has not participated in PT recently, did not help in the past.  Multimodal medical therapy including regular antiinflammatories: none  Injections:  05/17/2024: Bilateral L5-S1 transforaminal ESI (Dr. Dodson, no relief) 07/31/2019: Right L5-S1 transforaminal ESI (complete relief) 06/12/2019: Right hip joint injection    Past Surgery: no spinal surgeries   Review of Systems:  A 10 point review of systems is negative, except for the pertinent positives and negatives detailed in the HPI.  Past Medical History: Past Medical History:  Diagnosis Date   Acute  pancreatitis 06/05/2022   Allergy    Notes in my chart   Anemia    Arthritis 2016   Noted from Mri and xrays   Body tinea 10/01/2015   Previously treated by Dr. charlott    Cancer [facility]) 09/2015   Had full mascetomy   COVID-19 02/23/2021   MAB infusion 01/23/2021   GERD (gastroesophageal reflux disease)    History of breast cancer 09/2015   breast cancer   History of chicken pox    History of peptic ulcer disease 09/25/2015   Hyperlipidemia    Hypertension    Hypothyroidism    Neuropathy of foot    bilateral   Pinched nerve    PONV (postoperative nausea and vomiting)    exploratory surgery at [facility]   SOB (shortness of breath) 03/06/2021   Thyroid  disease    Tuberculosis exposure    Mother had TB.  Pt shows (+) on tests, but has never had.   Vertigo    last episode over 1 yr ago   Wears dentures    partial upper and lower    Past Surgical History: Past Surgical History:  Procedure Laterality Date   ABDOMINAL HYSTERECTOMY  07/13/2013   Total. with BSO for postmenopausal bleeding and fibroids with large cervical polyp. Dr. Francina and Dr. Leonce at [facility]   ABDOMINAL ULTRASOUND  04/17/2011   Hepatomegaly with borderline slenomegaly. Suggestive of fatty liver. s/p cholecystectomy. Portions of aorta obscured   BREAST SURGERY Right 2011   BREAST BIOPSY   CESAREAN SECTION  1988   CHOLECYSTECTOMY  1990  COLONOSCOPY WITH PROPOFOL  N/A 09/01/2017   Procedure: COLONOSCOPY WITH PROPOFOL ;  Surgeon: Jinny Carmine, MD;  Location: [facility];  Service: Gastroenterology;  Laterality: N/A;   DIAGNOSTIC LAPAROSCOPY     ESOPHAGOGASTRODUODENOSCOPY (EGD) WITH PROPOFOL  N/A 01/17/2020   Procedure: ESOPHAGOGASTRODUODENOSCOPY (EGD) WITH PROPOFOL ;  Surgeon: Jinny Carmine, MD;  Location: [facility];  Service: Endoscopy;  Laterality: N/A;   EXPLORATORY LAPAROTOMY     Left breast   EYE SURGERY  2024   Cataract removed from both eyes   MASTECTOMY     MASTECTOMY W/ SENTINEL NODE BIOPSY  Bilateral 10/28/2015   Procedure: MASTECTOMY WITH SENTINEL LYMPH NODE BIOPSY;  Surgeon: Dorothyann LITTIE Husk, MD;  Location: [facility];  Service: General;  Laterality: Bilateral;   PORT-A-CATH REMOVAL Left 10/11/2016   Procedure: REMOVAL PORT-A-CATH;  Surgeon: Dorothyann LITTIE Husk, MD;  Location: [facility];  Service: General;  Laterality: Left;   PORTACATH PLACEMENT Left 12/23/2015   Procedure: INSERTION PORT-A-CATH;  Surgeon: Dorothyann LITTIE Husk, MD;  Location: [facility] OR[facility];  Service: General;  Laterality: Left;   TUBAL LIGATION  1988   After C-section   WRIST SURGERY      Allergies: Allergies as of 06/26/2024 - Review Complete 06/26/2024  Allergen Reaction Noted   Penicillins Shortness Of Breath 08/20/2015   Nickel Hives 10/15/2015   Cardura  [doxazosin ] Other (See Comments) 01/03/2023   Dyazide [hydrochlorothiazide -triamterene ]  04/16/2024   Keflex [cephalexin] Swelling 01/21/2021   Pravastatin sodium Other (See Comments) 08/20/2015    Medications: Outpatient Encounter Medications as of 06/26/2024  Medication Sig   amLODipine  (NORVASC ) 2.5 MG tablet Take 1 tablet (2.5 mg total) by mouth daily.   Carboxymeth-Glycerin-Polysorb (REFRESH OPTIVE MEGA-3 OP) Place 1 drop into both eyes as needed.   docusate sodium  (COLACE) 100 MG capsule Take 200 mg by mouth daily. 2 in am, 1 at night   fexofenadine (ALLEGRA) 60 MG tablet Take 60 mg by mouth daily.   ketoconazole  (NIZORAL ) 2 % cream Apply topically daily as needed for irritation.   levothyroxine  (SYNTHROID ) 75 MCG tablet TAKE 1 TABLET DAILY   meclizine  (ANTIVERT ) 12.5 MG tablet TAKE 1 TABLET(12.5 MG) BY MOUTH THREE TIMES DAILY AS NEEDED FOR DIZZINESS   Misc Natural Products (TURMERIC, CURCUMIN, PO) Take by mouth.   pantoprazole  (PROTONIX ) 40 MG tablet Take 1 tablet (40 mg total) by mouth daily.   torsemide  (DEMADEX ) 10 MG tablet Take 1 tablet (10 mg total) by mouth daily.   triamcinolone  (KENALOG ) 0.025 % cream Apply 1 Application topically 2 (two)  times daily. As needed   valsartan  (DIOVAN ) 80 MG tablet TAKE 1 TABLET(80 MG) BY MOUTH TWICE DAILY   Vitamin D , Ergocalciferol , (DRISDOL ) 1.25 MG (50000 UNIT) CAPS capsule TAKE 1 CAPSULE BY MOUTH EVERY 7 DAYS   [DISCONTINUED] fluticasone  (FLONASE ) 50 MCG/ACT nasal spray SHAKE LIQUID AND USE 1 SPRAY IN EACH NOSTRIL DAILY (Patient not taking: Reported on 06/15/2024)   [DISCONTINUED] Misc Natural Products (GLUCOSAMINE CHONDROITIN MSM) TABS Take by mouth.   No facility-administered encounter medications on file as of 06/26/2024.    Social History: Social History   Tobacco Use   Smoking status: Never   Smokeless tobacco: Never  Vaping Use   Vaping status: Never Used  Substance Use Topics   Alcohol use: Never   Drug use: Never    Family Medical History: Family History  Problem Relation Age of Onset   Hypertension Mother    Thyroid  disease Mother    Hypertension Brother    Diabetes Brother  Breast cancer Neg Hx    Ovarian cancer Neg Hx    Colon cancer Neg Hx     Physical Examination: Vitals:   06/26/24 1052 06/26/24 1124  BP: (!) 146/66 132/68      Awake, alert, oriented to person, place, and time.  Speech is clear and fluent. Fund of knowledge is appropriate.   Cranial Nerves: Pupils equal round and reactive to light.  Facial tone is symmetric.     No abnormal lesions on exposed skin.   Strength:  Side Iliopsoas Quads Hamstring PF DF EHL  R 5 5 5 5 5 5   L 5 5 5 5 5 5    Clonus is not present.   Bilateral lower extremity sensation is intact to light touch.     No pain with IR/ER of both hips.   Gait is normal.    Medical Decision Making  Imaging: none  Assessment and Plan: Ms. Parrilla has a history of chronic intermittent LBP that has been worse and more constant in last year.   She has more constant LBP with no leg pain. She has intermittent muscle cramps in her thighs. She has no numbness, tingling, or weakness. Pain is worse with prolonged standing and  walking.    She has known diffuse moderate/severe lumbar spondylosis and DDD. She has severe central stenosis L2-L3, moderate central stenosis L3-L4, and moderate/severe central stenosis L4-L5.   No relief with lumbar ESI.    Treatment options discussed with patient and following plan made:   - She started tumeric and wants to give it 6 weeks to see if it helps.  - Message to GI at [facility] to see if they are okay with her taking tumeric. She should hear back from them. History of gastritis.  - Discussed PT for lumbar spine again. She declines.  - Weight loss discussed and encouraged. Goal weight would be 220-240lbs prior to any surgery.  - She would also need an updated lumbar MRI (she is very claustrophobic).  - I will message her in 6 weeks to check on her progress and regroup.   I spent a total of 25 minutes in face-to-face and non-face-to-face activities related to this patient's care today including review of outside records, review of imaging, review of symptoms, physical exam, discussion of differential diagnosis, discussion of treatment options, and documentation.   Glade Boys PA-C Dept. of Neurosurgery

## 2024-06-26 ENCOUNTER — Ambulatory Visit (INDEPENDENT_AMBULATORY_CARE_PROVIDER_SITE_OTHER): Admitting: Orthopedic Surgery

## 2024-06-26 ENCOUNTER — Encounter: Payer: Self-pay | Admitting: Orthopedic Surgery

## 2024-06-26 VITALS — BP 132/68 | Ht 66.0 in | Wt 290.4 lb

## 2024-06-26 DIAGNOSIS — M47816 Spondylosis without myelopathy or radiculopathy, lumbar region: Secondary | ICD-10-CM | POA: Diagnosis not present

## 2024-06-26 DIAGNOSIS — M5136 Other intervertebral disc degeneration, lumbar region with discogenic back pain only: Secondary | ICD-10-CM | POA: Diagnosis not present

## 2024-06-26 DIAGNOSIS — M48061 Spinal stenosis, lumbar region without neurogenic claudication: Secondary | ICD-10-CM | POA: Diagnosis not present

## 2024-06-26 NOTE — Patient Instructions (Addendum)
 It was so nice to see you today. Thank you so much for coming in.    Let's see if you see any improvement with the tumeric. You should be hearing from GI at the [facility] to make sure they are okay with you taking it.   Continue to work on weight loss. Goal weight for surgery would be 220-240 lbs.   I will message you in about 6 weeks. If no improvement, we can discuss physical therapy and possibly updated your lumbar MRI scan.   Please do not hesitate to call if you have any questions or concerns. You can also message me in MyChart.   Glade Boys PA-C [phone]     The physicians and staff at [facility] Neurosurgery at [facility] are committed to providing excellent care. You may receive a survey asking for feedback about your experience at our office. We value you your feedback and appreciate you taking the time to to fill it out. The [facility] leadership team is also available to discuss your experience in person, feel free to contact us  [phone].

## 2024-07-18 DIAGNOSIS — Z961 Presence of intraocular lens: Secondary | ICD-10-CM | POA: Diagnosis not present

## 2024-07-18 DIAGNOSIS — H04123 Dry eye syndrome of bilateral lacrimal glands: Secondary | ICD-10-CM | POA: Diagnosis not present

## 2024-07-18 DIAGNOSIS — H0288B Meibomian gland dysfunction left eye, upper and lower eyelids: Secondary | ICD-10-CM | POA: Diagnosis not present

## 2024-07-18 DIAGNOSIS — H0288A Meibomian gland dysfunction right eye, upper and lower eyelids: Secondary | ICD-10-CM | POA: Diagnosis not present

## 2024-07-19 ENCOUNTER — Other Ambulatory Visit: Payer: Self-pay | Admitting: Family Medicine

## 2024-07-19 DIAGNOSIS — R600 Localized edema: Secondary | ICD-10-CM

## 2024-07-19 DIAGNOSIS — I10 Essential (primary) hypertension: Secondary | ICD-10-CM

## 2024-07-24 ENCOUNTER — Telehealth: Payer: Self-pay | Admitting: Family Medicine

## 2024-07-24 DIAGNOSIS — R600 Localized edema: Secondary | ICD-10-CM

## 2024-07-24 DIAGNOSIS — I10 Essential (primary) hypertension: Secondary | ICD-10-CM

## 2024-07-24 MED ORDER — VALSARTAN 80 MG PO TABS: 80.0000 mg | ORAL_TABLET | Freq: Two times a day (BID) | ORAL | 1 refills | Status: DC

## 2024-07-24 MED ORDER — TORSEMIDE 10 MG PO TABS: 10.0000 mg | ORAL_TABLET | Freq: Every day | ORAL | 1 refills | Status: DC

## 2024-07-24 NOTE — Telephone Encounter (Signed)
 [facility] faxed refill request for the following medications:   torsemide  (DEMADEX ) 10 MG tablet    valsartan  (DIOVAN ) 80 MG tablet    Please advise.

## 2024-07-25 DIAGNOSIS — N9089 Other specified noninflammatory disorders of vulva and perineum: Secondary | ICD-10-CM | POA: Diagnosis not present

## 2024-07-25 DIAGNOSIS — N9489 Other specified conditions associated with female genital organs and menstrual cycle: Secondary | ICD-10-CM | POA: Diagnosis not present

## 2024-07-25 DIAGNOSIS — D1801 Hemangioma of skin and subcutaneous tissue: Secondary | ICD-10-CM | POA: Diagnosis not present

## 2024-08-02 DIAGNOSIS — N9089 Other specified noninflammatory disorders of vulva and perineum: Secondary | ICD-10-CM | POA: Diagnosis not present

## 2024-08-21 ENCOUNTER — Other Ambulatory Visit: Payer: Self-pay | Admitting: Family Medicine

## 2024-08-21 ENCOUNTER — Telehealth: Payer: Self-pay

## 2024-08-21 DIAGNOSIS — I10 Essential (primary) hypertension: Secondary | ICD-10-CM

## 2024-08-21 DIAGNOSIS — R600 Localized edema: Secondary | ICD-10-CM

## 2024-08-21 NOTE — Telephone Encounter (Signed)
 I think there is already a refill request in your basket   Copied from CRM [id number]. 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

## 2024-08-21 NOTE — Telephone Encounter (Unsigned)
 Copied from CRM [id number]. Topic: Clinical - Medication Refill >> Aug 21, 2024  1:52 PM Kaylee Reyes wrote: Medication: triamcinolone  (KENALOG ) 0.025 % cream  Has the patient contacted their pharmacy? No  This is the patient's preferred pharmacy:  [id number] - [place], [place] - [address] AT [facility] [address] [place] [address] [place] [address] Phone: [phone] Fax: [phone]  Is this the correct pharmacy for this prescription? Yes  Has the prescription been filled recently? Yes  Is the patient out of the medication? Yes  Has the patient been seen for an appointment in the last year OR does the patient have an upcoming appointment? Yes  Can we respond through MyChart? Yes  Agent: Please be advised that Rx refills may take up to 3 business days. We ask that you follow-up with your pharmacy.

## 2024-08-22 MED ORDER — TRIAMCINOLONE ACETONIDE 0.025 % EX CREA: 1.0000 | TOPICAL_CREAM | Freq: Two times a day (BID) | CUTANEOUS | 2 refills | Status: AC

## 2024-08-22 NOTE — Telephone Encounter (Signed)
 Requested medication (s) are due for refill today: routing for review  Requested medication (s) are on the active medication list: no  Last refill:  06/04/22  Future visit scheduled: no  Notes to clinic:  Unable to refill per protocol, cannot delegate. Historical medication.      Requested Prescriptions  Pending Prescriptions Disp Refills   triamcinolone  (KENALOG ) 0.025 % cream 30 g     Sig: Apply 1 Application topically 2 (two) times daily. As needed     Not Delegated - Dermatology:  Corticosteroids Failed - 08/22/2024  3:51 PM      Failed - This refill cannot be delegated      Passed - Valid encounter within last 12 months    Recent Outpatient Visits           2 months ago Primary hypertension   [place] [facility] Nancy[facility] BRAVO, MD   3 months ago Essential hypertension   [place] [facility] Nancyann BRAVO, MD   4 months ago Primary hypertension   [place] [facility] Gas[facility] Nancyann BRAVO, MD   4 months ago Dysuria   [place] [facility] Simmons-Robinson, [place], MD   5 months ago Primary hypertension   [place] [facility] Svensen, Janna, PA-C

## 2024-08-27 DIAGNOSIS — N9489 Other specified conditions associated with female genital organs and menstrual cycle: Secondary | ICD-10-CM | POA: Diagnosis not present

## 2024-09-07 ENCOUNTER — Other Ambulatory Visit: Payer: Self-pay | Admitting: Oncology

## 2024-09-18 ENCOUNTER — Ambulatory Visit: Payer: Self-pay

## 2024-09-18 ENCOUNTER — Other Ambulatory Visit: Payer: Self-pay

## 2024-09-18 ENCOUNTER — Other Ambulatory Visit: Payer: Self-pay | Admitting: Family Medicine

## 2024-09-18 DIAGNOSIS — R600 Localized edema: Secondary | ICD-10-CM

## 2024-09-18 DIAGNOSIS — I10 Essential (primary) hypertension: Secondary | ICD-10-CM

## 2024-09-18 MED ORDER — TORSEMIDE 10 MG PO TABS: 10.0000 mg | ORAL_TABLET | Freq: Every day | ORAL | 1 refills | Status: DC

## 2024-09-18 NOTE — Telephone Encounter (Signed)
 Routing information to PCP office for review:  FYI Only or Action Required?: Action required by provider: clinical question for provider.  Patient was last seen in primary care on 06/15/2024 by Kaylee Nancyann BRAVO, MD.  Called Nurse Triage reporting No chief complaint on file..  Symptoms began today.    Copied from CRM [id number]. Topic: Clinical - Medication Question >> Sep 18, 2024  3:33 PM Amber H wrote: Reason for CRM: Patient called and stated she needed her torsemide  (DEMADEX ) 10 MG tablet refilled due to receiving a text from her pharmacy saying she had a refill due. Saw in chart that the medication was denied but unsure why. Called [facility] and spoke to [place]. Was advised to send a message due to it not being clear why med refill was denied last month. Does want refill sent to [facility] (in file) if still needed. Patient stated she still has some meds left.

## 2024-09-18 NOTE — Telephone Encounter (Signed)
 LOV 06/15/24 NOV none LRF 07/24/24 30 x 1

## 2024-09-25 ENCOUNTER — Ambulatory Visit

## 2024-09-26 NOTE — Telephone Encounter (Unsigned)
 Copied from CRM [id number]. Topic: Clinical - Medication Refill >> Sep 26, 2024  2:16 PM Lonell PEDLAR wrote: Medication: ketoconazole  (NIZORAL ) 2 % cream   Has the patient contacted their pharmacy? No  This is the patient's preferred pharmacy:  [id number] - [place], [place] - [address] AT [facility] [address] [place] [address] [place] [address] Phone: [phone] Fax: [phone]  Is this the correct pharmacy for this prescription? Yes If no, delete pharmacy and type the correct one.   Has the prescription been filled recently? No  Is the patient out of the medication? No  Has the patient been seen for an appointment in the last year OR does the patient have an upcoming appointment? Yes  Can we respond through MyChart? No  Agent: Please be advised that Rx refills may take up to 3 business days. We ask that you follow-up with your pharmacy.

## 2024-11-02 ENCOUNTER — Telehealth: Payer: Self-pay

## 2024-11-02 DIAGNOSIS — J069 Acute upper respiratory infection, unspecified: Secondary | ICD-10-CM | POA: Diagnosis not present

## 2024-11-05 NOTE — Telephone Encounter (Signed)
 Kaylee Reyes

## 2024-11-15 ENCOUNTER — Ambulatory Visit: Payer: Self-pay

## 2024-11-15 DIAGNOSIS — R300 Dysuria: Secondary | ICD-10-CM | POA: Diagnosis not present

## 2024-11-15 DIAGNOSIS — N3000 Acute cystitis without hematuria: Secondary | ICD-10-CM | POA: Diagnosis not present

## 2024-11-15 NOTE — Telephone Encounter (Signed)
 FYI Only or Action Required?: FYI only for provider: UC advised.  Patient was last seen in primary care on 06/15/2024 by Kaylee Nancyann BRAVO, MD.  Called Nurse Triage reporting Dysuria.  Symptoms began a week ago.  Interventions attempted: OTC medications: URIcalm.  Symptoms are: gradually worsening.  Triage Disposition: See Physician Within 24 Hours  Patient/caregiver understands and will follow disposition?: Yes Reason for Disposition  Age > 50 years  Answer Assessment - Initial Assessment Questions Patient taking cranberry pills and UrIcalm. Patient states she wears pads for leakage and is wondering if that has been causing it. Gave patient option of UC or appointment at PCP office tomorrow afternoon, patient states she feels its getting worse today, advised UC. Patient agreeable.  1. SEVERITY: How bad is the pain?  (e.g., Scale 1-10; mild, moderate, or severe)     Stings and pressure  2. ONSET: When did the painful urination start?      A week  3. FEVER: Do you have a fever? If Yes, ask: What is your temperature, how was it measured, and when did it start?     Denies  4. PAST UTI: Have you had a urine infection before? If Yes, ask: When was the last time? and What happened that time?      Months, but within the same year  5. OTHER SYMPTOMS: Do you have any other symptoms? (e.g., blood in urine, flank pain, genital sores, urgency, vaginal discharge)     Denies  Protocols used: Urination Pain - [facility]  Copied from CRM #[id number]. Topic: Clinical - Red Word Triage >> Nov 15, 2024 10:25 AM Emylou G wrote: [facility] that prompted transfer to Nurse Triage: Burning ( pain ) and irritation when peeing.. possible uti?

## 2024-12-10 ENCOUNTER — Telehealth: Payer: Self-pay | Admitting: Family Medicine

## 2024-12-10 ENCOUNTER — Other Ambulatory Visit: Payer: Self-pay | Admitting: *Deleted

## 2024-12-10 DIAGNOSIS — I10 Essential (primary) hypertension: Secondary | ICD-10-CM

## 2024-12-10 DIAGNOSIS — R600 Localized edema: Secondary | ICD-10-CM

## 2024-12-10 MED ORDER — VITAMIN D (ERGOCALCIFEROL) 1.25 MG (50000 UNIT) PO CAPS: 50000.0000 [IU] | ORAL_CAPSULE | ORAL | 1 refills | Status: AC

## 2024-12-10 NOTE — Telephone Encounter (Signed)
 Caller verified using pt's full name and dob prior to discussing PHI    Patient needs new script for Vitamin D  (50000 unit 1 capsule weekly) script to be sent to [facility] due to change insurance.  Script pended for your signature.

## 2024-12-10 NOTE — Telephone Encounter (Addendum)
 New prescription/ Refill Request   Pharmacy: [facility]  Medication:  torsemide  (DEMADEX ) 10 MG tablet  valsartan  (DIOVAN ) 80 MG tablet   levothyroxine  (SYNTHROID ) 75 MCG tablet   meclizine  (ANTIVERT ) 12.5 MG tablet   Fluticasone  Propionate nasal spray 50MCG  pantoprazole  (PROTONIX ) 40 MG tablet    Please send in refill request.

## 2024-12-11 MED ORDER — PANTOPRAZOLE SODIUM 40 MG PO TBEC: 40.0000 mg | DELAYED_RELEASE_TABLET | Freq: Every day | ORAL | 1 refills | Status: AC

## 2024-12-11 MED ORDER — VALSARTAN 80 MG PO TABS: 80.0000 mg | ORAL_TABLET | Freq: Two times a day (BID) | ORAL | 1 refills | Status: DC

## 2024-12-11 MED ORDER — TORSEMIDE 10 MG PO TABS: 10.0000 mg | ORAL_TABLET | Freq: Once | ORAL | 0 refills | Status: DC

## 2024-12-11 MED ORDER — MECLIZINE HCL 12.5 MG PO TABS: ORAL_TABLET | ORAL | 3 refills | Status: AC

## 2024-12-11 MED ORDER — LEVOTHYROXINE SODIUM 75 MCG PO TABS: 75.0000 ug | ORAL_TABLET | Freq: Every day | ORAL | 3 refills | Status: AC

## 2024-12-13 ENCOUNTER — Telehealth: Payer: Self-pay

## 2024-12-13 ENCOUNTER — Other Ambulatory Visit: Payer: Self-pay

## 2024-12-13 MED ORDER — KETOCONAZOLE 2 % EX CREA: TOPICAL_CREAM | Freq: Every day | CUTANEOUS | 0 refills | Status: AC | PRN

## 2024-12-13 NOTE — Telephone Encounter (Signed)
 Copied from CRM #[id number]. Topic: Clinical - Prescription Issue >> Dec 13, 2024 12:12 PM Geneva B wrote: Reason for CRM: patient is calling has questions about her rx  meclizine  (ANTIVERT ) 12.5 MG tablet  please call pt back [phone]

## 2024-12-14 ENCOUNTER — Other Ambulatory Visit: Payer: Self-pay | Admitting: Family Medicine

## 2024-12-14 DIAGNOSIS — I10 Essential (primary) hypertension: Secondary | ICD-10-CM

## 2024-12-14 DIAGNOSIS — R600 Localized edema: Secondary | ICD-10-CM

## 2024-12-14 MED ORDER — TORSEMIDE 10 MG PO TABS: 10.0000 mg | ORAL_TABLET | Freq: Every day | ORAL | 3 refills | Status: AC

## 2024-12-17 ENCOUNTER — Other Ambulatory Visit: Payer: Self-pay | Admitting: Family Medicine

## 2024-12-17 ENCOUNTER — Telehealth: Payer: Self-pay | Admitting: Family Medicine

## 2024-12-17 DIAGNOSIS — I10 Essential (primary) hypertension: Secondary | ICD-10-CM

## 2024-12-17 MED ORDER — FLUTICASONE PROPIONATE 50 MCG/ACT NA SUSP: 2.0000 | Freq: Every day | NASAL | 6 refills | Status: AC

## 2024-12-17 NOTE — Telephone Encounter (Signed)
 Converted and sent to pharmacy

## 2024-12-17 NOTE — Telephone Encounter (Unsigned)
 Copied from CRM #[id number]. Topic: Clinical - Medication Refill >> Dec 17, 2024  2:19 PM Larissa S wrote: Medication: amLODipine  (NORVASC ) 2.5 MG tablet, 90 day supply   Has the patient contacted their pharmacy? Yes (Agent: If no, request that the patient contact the pharmacy for the refill. If patient does not wish to contact the pharmacy document the reason why and proceed with request.) (Agent: If yes, when and what did the pharmacy advise?)  This is the patient's preferred pharmacy:  [facility] - [place], [place] - [address] [place] [address] Phone: [phone] Fax: [phone]  Is this the correct pharmacy for this prescription? Yes If no, delete pharmacy and type the correct one.   Has the prescription been filled recently? Yes  Is the patient out of the medication? No  Has the patient been seen for an appointment in the last year OR does the patient have an upcoming appointment? Yes  Can we respond through MyChart? No  Agent: Please be advised that Rx refills may take up to 3 business days. We ask that you follow-up with your pharmacy.

## 2024-12-17 NOTE — Telephone Encounter (Signed)
 [facility] faxed refill request for the following medications:  Fluticasone  propionate nasal spray 50mcg    Please advise.

## 2024-12-18 MED ORDER — AMLODIPINE BESYLATE 2.5 MG PO TABS: 2.5000 mg | ORAL_TABLET | Freq: Every day | ORAL | 0 refills | Status: DC

## 2024-12-18 NOTE — Telephone Encounter (Signed)
 Attempted to contact patient to schedule appointment- no answer- left message. Courtesy RF given 30 day Requested Prescriptions  Pending Prescriptions Disp Refills   amLODipine  (NORVASC ) 2.5 MG tablet 90 tablet 0    Sig: Take 1 tablet (2.5 mg total) by mouth daily.     Cardiovascular: Calcium  Channel Blockers 2 Failed - 12/18/2024  2:18 PM      Failed - Valid encounter within last 6 months    Recent Outpatient Visits           6 months ago Primary hypertension   [place] [facility] Gas[place] Nancyann BRAVO, MD   7 months ago Essential hypertension   [place] [facility] Gas[place] Nancyann BRAVO, MD   8 months ago Primary hypertension   [place] [facility] Gas[place] Nancyann BRAVO, MD   8 months ago Dysuria   [place] [facility] [place], [place], [place]   9 months ago Primary hypertension   [place] [facility] [place], Janna, PA-C              Passed - Last BP in normal range    BP Readings from Last 1 Encounters:  06/26/24 132/68         Passed - Last Heart Rate in normal range    Pulse Readings from Last 1 Encounters:  06/15/24 76

## 2024-12-31 ENCOUNTER — Emergency Department

## 2024-12-31 ENCOUNTER — Emergency Department
Admission: EM | Admit: 2024-12-31 | Discharge: 2025-01-01 | Disposition: A | Attending: Emergency Medicine | Admitting: Emergency Medicine

## 2024-12-31 ENCOUNTER — Other Ambulatory Visit: Payer: Self-pay

## 2024-12-31 DIAGNOSIS — N83202 Unspecified ovarian cyst, left side: Secondary | ICD-10-CM | POA: Diagnosis not present

## 2024-12-31 DIAGNOSIS — E669 Obesity, unspecified: Secondary | ICD-10-CM | POA: Insufficient documentation

## 2024-12-31 DIAGNOSIS — E039 Hypothyroidism, unspecified: Secondary | ICD-10-CM | POA: Diagnosis not present

## 2024-12-31 DIAGNOSIS — R030 Elevated blood-pressure reading, without diagnosis of hypertension: Secondary | ICD-10-CM | POA: Diagnosis present

## 2024-12-31 DIAGNOSIS — R519 Headache, unspecified: Secondary | ICD-10-CM | POA: Diagnosis present

## 2024-12-31 DIAGNOSIS — I10 Essential (primary) hypertension: Secondary | ICD-10-CM | POA: Insufficient documentation

## 2024-12-31 DIAGNOSIS — Z8616 Personal history of COVID-19: Secondary | ICD-10-CM | POA: Insufficient documentation

## 2024-12-31 DIAGNOSIS — E785 Hyperlipidemia, unspecified: Secondary | ICD-10-CM | POA: Diagnosis not present

## 2024-12-31 DIAGNOSIS — Z6841 Body Mass Index (BMI) 40.0 and over, adult: Secondary | ICD-10-CM | POA: Diagnosis not present

## 2024-12-31 DIAGNOSIS — Z79890 Hormone replacement therapy (postmenopausal): Secondary | ICD-10-CM | POA: Insufficient documentation

## 2024-12-31 DIAGNOSIS — Z79899 Other long term (current) drug therapy: Secondary | ICD-10-CM | POA: Diagnosis not present

## 2024-12-31 DIAGNOSIS — Z853 Personal history of malignant neoplasm of breast: Secondary | ICD-10-CM | POA: Insufficient documentation

## 2024-12-31 DIAGNOSIS — R079 Chest pain, unspecified: Secondary | ICD-10-CM

## 2024-12-31 LAB — CBC WITH DIFFERENTIAL/PLATELET
Abs Immature Granulocytes: 0.04 10*3/uL (ref 0.00–0.07)
Basophils Absolute: 0 10*3/uL (ref 0.0–0.1)
Basophils Relative: 1 %
Eosinophils Absolute: 0.1 10*3/uL (ref 0.0–0.5)
Eosinophils Relative: 1 %
HCT: 35.7 % — ABNORMAL LOW (ref 36.0–46.0)
Hemoglobin: 11.2 g/dL — ABNORMAL LOW (ref 12.0–15.0)
Immature Granulocytes: 1 %
Lymphocytes Relative: 18 %
Lymphs Abs: 1.6 10*3/uL (ref 0.7–4.0)
MCH: 27.4 pg (ref 26.0–34.0)
MCHC: 31.4 g/dL (ref 30.0–36.0)
MCV: 87.3 fL (ref 80.0–100.0)
Monocytes Absolute: 0.4 10*3/uL (ref 0.1–1.0)
Monocytes Relative: 4 %
Neutro Abs: 6.8 10*3/uL (ref 1.7–7.7)
Neutrophils Relative %: 75 %
Platelets: 206 10*3/uL (ref 150–400)
RBC: 4.09 MIL/uL (ref 3.87–5.11)
RDW: 14.4 % (ref 11.5–15.5)
WBC: 8.8 10*3/uL (ref 4.0–10.5)
nRBC: 0 % (ref 0.0–0.2)

## 2024-12-31 MED ORDER — MORPHINE SULFATE (PF) 4 MG/ML IV SOLN
4.0000 mg | Freq: Once | INTRAVENOUS | Status: DC
  Filled 2024-12-31: qty 1

## 2024-12-31 MED ORDER — IOHEXOL 350 MG/ML SOLN
100.0000 mL | Freq: Once | INTRAVENOUS | Status: AC | PRN
  Administered 2024-12-31: 100 mL via INTRAVENOUS

## 2024-12-31 MED ORDER — ONDANSETRON HCL 4 MG/2ML IJ SOLN
4.0000 mg | Freq: Once | INTRAMUSCULAR | Status: DC
  Filled 2024-12-31: qty 2

## 2024-12-31 MED ORDER — AMLODIPINE BESYLATE 5 MG PO TABS
2.5000 mg | ORAL_TABLET | Freq: Once | ORAL | Status: AC
  Administered 2024-12-31: 2.5 mg via ORAL
  Filled 2024-12-31: qty 1

## 2024-12-31 MED ORDER — IRBESARTAN 75 MG PO TABS
75.0000 mg | ORAL_TABLET | Freq: Once | ORAL | Status: AC
  Administered 2024-12-31: 75 mg via ORAL
  Filled 2024-12-31: qty 1

## 2024-12-31 MED ORDER — ACETAMINOPHEN 500 MG PO TABS
1000.0000 mg | ORAL_TABLET | Freq: Once | ORAL | Status: AC
  Administered 2024-12-31: 1000 mg via ORAL
  Filled 2024-12-31: qty 2

## 2024-12-31 NOTE — ED Triage Notes (Signed)
 Pt to ED via EMS from home, pt reports she noticed she had a headache checked her bp and it was elevated. Pt reports she only took half her prescribed dose of meds tonight. Pt denies chest pain at this time. No numbness or weakness in extremities speech clear.

## 2024-12-31 NOTE — ED Provider Notes (Incomplete)
 "  [facility] Provider Note    Event Date/Time   First MD Initiated Contact with Patient 12/31/24 2313     (approximate)   History   Hypertension (/)   HPI  Kaylee Reyes is a 74 y.o. female patient with history of hypertension on valsartan  and amlodipine , hyperlipidemia, hypothyroidism, obesity who presents to the emergency department with headache, left-sided chest pain that radiates into the back.  The symptoms started tonight at rest.  Reports her blood pressures have been high over the last week.  She has not seen her primary care doctor yet.  Has chronic shortness of breath which is unchanged.  Had some nausea tonight.  No numbness, tingling, weakness, vision changes.  Took her blood pressure medications this morning as prescribed and took an extra dose half dose of valsartan  prior to coming in due to headache and elevated blood pressure at home.  Has not taken her 7:30 PM valsartan  and amlodipine  yet.  No new calf tenderness or calf swelling.  Not on blood thinners.   History provided by patient.    Past Medical History:  Diagnosis Date   Acute pancreatitis 06/05/2022   Allergy    Notes in my chart   Anemia    Arthritis 2016   Noted from Mri and xrays   Body tinea 10/01/2015   Previously treated by Dr. charlott    Cancer [facility]) 09/2015   Had full mascetomy   COVID-19 02/23/2021   MAB infusion 01/23/2021   GERD (gastroesophageal reflux disease)    History of breast cancer 09/2015   breast cancer   History of chicken pox    History of peptic ulcer disease 09/25/2015   Hyperlipidemia    Hypertension    Hypothyroidism    Neuropathy of foot    bilateral   Pinched nerve    PONV (postoperative nausea and vomiting)    exploratory surgery at [facility]   SOB (shortness of breath) 03/06/2021   Thyroid  disease    Tuberculosis exposure    Mother had TB.  Pt shows (+) on tests, but has never had.   Vertigo    last episode over 1 yr ago   Wears  dentures    partial upper and lower    Past Surgical History:  Procedure Laterality Date   ABDOMINAL HYSTERECTOMY  07/13/2013   Total. with BSO for postmenopausal bleeding and fibroids with large cervical polyp. Dr. Francina and Dr. Leonce at [facility]   ABDOMINAL ULTRASOUND  04/17/2011   Hepatomegaly with borderline slenomegaly. Suggestive of fatty liver. s/p cholecystectomy. Portions of aorta obscured   BREAST SURGERY Right 2011   BREAST BIOPSY   CESAREAN SECTION  1988   CHOLECYSTECTOMY  1990   COLONOSCOPY WITH PROPOFOL  N/A 09/01/2017   Procedure: COLONOSCOPY WITH PROPOFOL ;  Surgeon: Jinny Carmine, MD;  [facility];  Service: Gastroenterology;  Laterality: N/A;   DIAGNOSTIC LAPAROSCOPY     ESOPHAGOGASTRODUODENOSCOPY (EGD) WITH PROPOFOL  N/A 01/17/2020   Procedure: ESOPHAGOGASTRODUODENOSCOPY (EGD) WITH PROPOFOL ;  Surgeon: Jinny Carmine, MD;  Location: [facility];  Service: Endoscopy;  Laterality: N/A;   EXPLORATORY LAPAROTOMY     Left breast   EYE SURGERY  2024   Cataract removed from both eyes   MASTECTOMY     MASTECTOMY W/ SENTINEL NODE BIOPSY Bilateral 10/28/2015   Procedure: MASTECTOMY WITH SENTINEL LYMPH NODE BIOPSY;  Surgeon: Dorothyann LITTIE Husk, MD;  Location: [facility];  Service: General;  Laterality: Bilateral;   PORT-A-CATH REMOVAL Left 10/11/2016  Procedure: REMOVAL PORT-A-CATH;  Surgeon: Dorothyann LITTIE Husk, MD;  Location: [facility];  Service: General;  Laterality: Left;   PORTACATH PLACEMENT Left 12/23/2015   Procedure: INSERTION PORT-A-CATH;  Surgeon: Dorothyann LITTIE Husk, MD;  Location: [facility];  Service: General;  Laterality: Left;   TUBAL LIGATION  1988   After C-section   WRIST SURGERY      MEDICATIONS:  Prior to Admission medications  Medication Sig Start Date End Date Taking? Authorizing Provider  amLODipine  (NORVASC ) 2.5 MG tablet Take 1 tablet (2.5 mg total) by mouth daily. 12/18/24   Gasper Nancyann BRAVO, MD  Carboxymeth-Glycerin-Polysorb (REFRESH  OPTIVE MEGA-3 OP) Place 1 drop into both eyes as needed.    [provider]  docusate sodium  (COLACE) 100 MG capsule Take 200 mg by mouth daily. 2 in am, 1 at night    [provider]  fexofenadine (ALLEGRA) 60 MG tablet Take 60 mg by mouth daily.    [provider]  fluticasone  (FLONASE ) 50 MCG/ACT nasal spray Place 2 sprays into both nostrils daily. 12/17/24   Gasper Nancyann BRAVO, MD  ketoconazole  (NIZORAL ) 2 % cream Apply topically daily as needed for irritation. 12/13/24   Gasper Nancyann BRAVO, MD  levothyroxine  (SYNTHROID ) 75 MCG tablet Take 1 tablet (75 mcg total) by mouth daily. 12/11/24   Gasper Nancyann BRAVO, MD  meclizine  (ANTIVERT ) 12.5 MG tablet TAKE 1 TABLET(12.5 MG) BY MOUTH THREE TIMES DAILY AS NEEDED FOR DIZZINESS 12/11/24   Gasper Nancyann BRAVO, MD  Misc Natural Products (TURMERIC, CURCUMIN, PO) Take by mouth.    [provider]  pantoprazole  (PROTONIX ) 40 MG tablet Take 1 tablet (40 mg total) by mouth daily. 12/11/24 12/11/25  Gasper Nancyann BRAVO, MD  torsemide  (DEMADEX ) 10 MG tablet Take 1 tablet (10 mg total) by mouth daily. For swelling and hypertension 12/14/24   Gasper Nancyann BRAVO, MD  triamcinolone  (KENALOG ) 0.025 % cream Apply 1 Application topically 2 (two) times daily. As needed 08/22/24   Gasper Nancyann BRAVO, MD  valsartan  (DIOVAN ) 80 MG tablet Take 1 tablet (80 mg total) by mouth 2 (two) times daily. 12/11/24   Gasper Nancyann BRAVO, MD  Vitamin D , Ergocalciferol , (DRISDOL ) 1.25 MG (50000 UNIT) CAPS capsule Take 1 capsule (50,000 Units total) by mouth every 7 (seven) days. 12/10/24   Jacobo Evalene PARAS, MD    Physical Exam   Triage Vital Signs: ED Triage Vitals  Encounter Vitals Group     BP 12/31/24 2018 (!) 175/80     Girls Systolic BP Percentile --      Girls Diastolic BP Percentile --      Boys Systolic BP Percentile --      Boys Diastolic BP Percentile --      Pulse Rate 12/31/24 2018 79     Resp 12/31/24 2018 17     Temp 12/31/24 2018 98.4 F (36.9 C)     Temp  src --      SpO2 12/31/24 2018 97 %     Weight 12/31/24 2017 300 lb (136.1 kg)     Height 12/31/24 2017 5' 6 (1.676 m)     Head Circumference --      Peak Flow --      Pain Score 12/31/24 2017 3     Pain Loc --      Pain Education --      Exclude from Growth Chart --     Most recent vital signs: Vitals:   01/01/25 0046 01/01/25 0100  BP: ROLLEN)  161/76 (!) 140/67  Pulse: 75 64  Resp: 19 16  Temp: 98.1 F (36.7 C)   SpO2: 100% 100%    CONSTITUTIONAL: Alert, responds appropriately to questions.  Obese HEAD: Normocephalic, atraumatic EYES: Conjunctivae clear, pupils appear equal, sclera nonicteric ENT: normal nose; moist mucous membranes NECK: Supple, normal ROM CARD: RRR; S1 and S2 appreciated, chest wall is nontender to palpation RESP: Normal chest excursion without splinting or tachypnea; breath sounds clear and equal bilaterally; no wheezes, no rhonchi, no rales, no hypoxia or respiratory distress, speaking full sentences ABD/GI: Non-distended; soft, non-tender, no rebound, no guarding, no peritoneal signs BACK: The back appears normal, upper back is nontender to palpation EXT: Normal ROM in all joints; no deformity noted, symmetric nonpitting edema in bilateral distal lower extremities, no calf tenderness, extremities warm and well-perfused SKIN: Normal color for age and race; warm; no rash on exposed skin NEURO: Moves all extremities equally, normal speech, normal sensation, no facial asymmetry PSYCH: The patient's mood and manner are appropriate.   ED Results / Procedures / Treatments   LABS: (all labs ordered are listed, but only abnormal results are displayed) Labs Reviewed  CBC WITH DIFFERENTIAL/PLATELET - Abnormal; Notable for the following components:      Result Value   Hemoglobin 11.2 (*)    HCT 35.7 (*)    All other components within normal limits  BASIC METABOLIC PANEL WITH GFR - Abnormal; Notable for the following components:   Glucose, Bld 108 (*)    All  other components within normal limits  URINALYSIS, ROUTINE W REFLEX MICROSCOPIC - Abnormal; Notable for the following components:   Color, Urine STRAW (*)    APPearance CLEAR (*)    Specific Gravity, Urine 1.032 (*)    All other components within normal limits  TROPONIN T, HIGH SENSITIVITY - Abnormal; Notable for the following components:   Troponin T High Sensitivity 21 (*)    All other components within normal limits  TROPONIN T, HIGH SENSITIVITY - Abnormal; Notable for the following components:   Troponin T High Sensitivity 20 (*)    All other components within normal limits     EKG:  EKG Interpretation Date/Time:    Ventricular Rate:    PR Interval:    QRS Duration:    QT Interval:    QTC Calculation:   R Axis:      Text Interpretation:           RADIOLOGY: My personal review and interpretation of imaging: CT is unremarkable.  I have personally reviewed all radiology reports.   CT Angio Chest/Abd/Pel for Dissection W and/or Wo Contrast Result Date: 01/01/2025 EXAM: CTA CHEST, ABDOMEN AND PELVIS WITH AND WITHOUT CONTRAST 12/31/2024 11:45:48 PM TECHNIQUE: CTA of the chest was performed with and without the administration of intravenous contrast. CTA of the abdomen and pelvis was performed with the administration of intravenous contrast. Contrast used: 100 mL iohexol  (OMNIPAQUE ) 350 MG/ML injection. Multiplanar reformatted images are provided for review. MIP images are provided for review. Automated exposure control, iterative reconstruction, and/or weight based adjustment of the mA/kV was utilized to reduce the radiation dose to as low as reasonably achievable. COMPARISON: Comparison study 01/21/2021, 05/31/2024. CLINICAL HISTORY: Chest pain and hypertension. FINDINGS: VASCULATURE: AORTA: Initial precontrast images show no hyperdense crescent to suggest acute aortic injury. Scattered atherosclerotic calcifications are noted. The thoracic aorta shows no aneurysmal dilatation or  findings of dissection. The abdominal aorta is well visualized with mild atherosclerotic calcification. No aneurysmal dilatation or dissection is  seen. PULMONARY ARTERIES: The pulmonary artery shows a normal branching pattern without evidence of pulmonary embolism. Timing wasn't performed for embolus evaluation, however. GREAT VESSELS OF AORTIC ARCH: No acute finding. No dissection. No arterial occlusion or significant stenosis. CELIAC TRUNK: The celiac axis is well visualized and within normal limits. SUPERIOR MESENTERIC ARTERY: The superior mesenteric artery is well visualized and within normal limits. INFERIOR MESENTERIC ARTERY: The inferior mesenteric artery is within normal limits. RENAL ARTERIES: Single renal artery is noted on the right. Dual renal arteries are noted on the left. ILIAC ARTERIES: The iliacs demonstrate atherosclerotic calcification without focal abnormality. CHEST: MEDIASTINUM: No mediastinal lymphadenopathy. The heart and pericardium demonstrate no acute abnormality. Mild coronary calcifications are seen. No cardiac enlargement is noted. The esophagus is within normal limits. LUNGS AND PLEURA: Lungs are well aerated bilaterally. Chronic scarring is noted in the right apex, stable from 2022. No sizable parenchymal nodule is noted. No focal consolidation or pulmonary edema. No evidence of pleural effusion or pneumothorax. THORACIC BONES AND SOFT TISSUES: Changes of prior bilateral mastectomy are noted. No acute bone or soft tissue abnormality. ABDOMEN AND PELVIS: LIVER: The liver is fatty infiltrated. GALLBLADDER AND BILE DUCTS: The gallbladder has been surgically removed. No biliary ductal dilatation. SPLEEN: The spleen is unremarkable. PANCREAS: The pancreas is unremarkable. ADRENAL GLANDS: Bilateral adrenal glands demonstrate no acute abnormality. KIDNEYS, URETERS AND BLADDER: No renal calculi or obstructive changes are seen. No stones in the kidneys or ureters. No hydronephrosis. No  perinephric or periureteral stranding. Urinary bladder is unremarkable. GI AND BOWEL: Stomach and duodenal sweep demonstrate no acute abnormality. The small bowel is unremarkable. No obstructive or inflammatory changes of the colon are noted. The appendix is within normal limits. There is no bowel obstruction. No abnormal bowel wall thickening or distension. REPRODUCTIVE: The uterus is within normal limits. A left adnexal cyst is noted which measures 6.8 cm. The overall appearance is similar to that seen on the prior exam. PERITONEUM AND RETROPERITONEUM: No ascites or free air. LYMPH NODES: Lymphadenopathy is present. ABDOMINAL BONES AND SOFT TISSUES: No acute bony abnormalities seen. No acute soft tissue abnormality. IMPRESSION: 1. No evidence of aortic dissection or aneurysmal dilatation. 2. No evidence of pulmonary embolism, although this was not a dedicated pulmonary embolism evaluation. 3. Left adnexal cyst measuring 6.8 cm, similar to prior; follow-up ultrasound in 12 months recommended. Electronically signed by: Oneil Devonshire MD 01/01/2025 12:04 AM EST RP Workstation: [id number]   CT HEAD WO CONTRAST ( ) Result Date: 12/31/2024 EXAM: CT HEAD WITHOUT CONTRAST 12/31/2024 11:45:48 PM TECHNIQUE: CT of the head was performed without the administration of intravenous contrast. Automated exposure control, iterative reconstruction, and/or weight based adjustment of the mA/kV was utilized to reduce the radiation dose to as low as reasonably achievable. COMPARISON: None available. CLINICAL HISTORY: Hypertension, headache. FINDINGS: BRAIN AND VENTRICLES: No acute hemorrhage. No evidence of acute infarct. No extra-axial collection. No mass effect or midline shift. ORBITS: Bilateral lens replacement. SINUSES: No acute abnormality. SOFT TISSUES AND SKULL: Hyperostosis frontalis interna. No acute soft tissue abnormality. No skull fracture. IMPRESSION: 1. No acute intracranial abnormality. Electronically signed by: Oneil Devonshire MD 12/31/2024 11:47 PM EST RP Workstation: [id number]     PROCEDURES:  Critical Care performed: No    .1-3 Lead EKG Interpretation  Performed by: Andruw Battie, Josette SAILOR, DO Authorized by: Rheanna Sergent, Josette SAILOR, DO     Interpretation: normal     ECG rate:  81   ECG rate assessment: normal     Rhythm:  sinus rhythm     Ectopy: none     Conduction: normal       IMPRESSION / MDM / ASSESSMENT AND PLAN / ED COURSE  I reviewed the triage vital signs and the nursing notes.    Patient here with hypertension, headache, chest pain.  The patient is on the cardiac monitor to evaluate for evidence of arrhythmia and/or significant heart rate changes.   DIFFERENTIAL DIAGNOSIS (includes but not limited to):   Hypertensive urgency, hypertensive emergency, intracranial hemorrhage, dissection, ACS, PE, chest wall pain, CHF, less likely pneumonia or pneumothorax, doubt stroke   Patient's presentation is most consistent with acute presentation with potential threat to life or bodily function.   PLAN: Will obtain labs, urine, EKG, CT head, CT dissection study.  Will give her her nightly doses of valsartan  and amlodipine .  Will give medication for pain and nausea.   MEDICATIONS GIVEN IN ED: Medications  morphine  (PF) 4 MG/ML injection 4 mg (0 mg Intravenous Hold 12/31/24 2331)  ondansetron  (ZOFRAN ) injection 4 mg (0 mg Intravenous Hold 12/31/24 2332)  irbesartan  (AVAPRO ) tablet 75 mg (75 mg Oral Given 12/31/24 2355)  amLODipine  (NORVASC ) tablet 2.5 mg (2.5 mg Oral Given 12/31/24 2355)  iohexol  (OMNIPAQUE ) 350 MG/ML injection 100 mL (100 mLs Intravenous Contrast Given 12/31/24 2332)  acetaminophen  (TYLENOL ) tablet 1,000 mg (1,000 mg Oral Given 12/31/24 2357)     ED COURSE: Blood pressure currently 140/67 she reports feeling much better after Tylenol .  Troponin x 2 minimally elevated but flat.  Normal creatinine.  Urine shows no proteinuria or hematuria.  CT scans reviewed and interpreted by  myself and the radiologist and show no intracranial hemorrhage, dissection, PE.  Given her blood pressure has improved here with just her normal routine medications have recommended that she continue this regimen and follow-up closely with her primary care doctor.  Patient also states that she previously saw Dr. Gollan in cardiology but states she thinks the last time she saw him was in 2021.  Will place new cardiology referral for chest pain.   At this time, I do not feel there is any life-threatening condition present. I reviewed all nursing notes, vitals, pertinent previous records.  All lab and urine results, EKGs, imaging ordered have been independently reviewed and interpreted by myself.  I reviewed all available radiology reports from any imaging ordered this visit.  Based on my assessment, I feel the patient is safe to be discharged home without further emergent workup and can continue workup as an outpatient as needed. Discussed all findings, treatment plan as well as usual and customary return precautions.  They verbalize understanding and are comfortable with this plan.  Outpatient follow-up has been provided as needed.  All questions have been answered.    CONSULTS:  none   OUTSIDE RECORDS REVIEWED: Reviewed recent family medicine notes.       FINAL CLINICAL IMPRESSION(S) / ED DIAGNOSES   Final diagnoses:  Uncontrolled hypertension  Chest pain, unspecified type     Rx / DC Orders   ED Discharge Orders     None        Note:  This document was prepared using Dragon voice recognition software and may include unintentional dictation errors.   Adrieana Fennelly, Josette SAILOR, DO 01/01/25 0139    Leiana Rund, Josette SAILOR, DO 01/01/25 0153  "

## 2025-01-01 ENCOUNTER — Encounter: Payer: Self-pay | Admitting: Family Medicine

## 2025-01-01 ENCOUNTER — Telehealth: Admitting: Family Medicine

## 2025-01-01 ENCOUNTER — Ambulatory Visit: Payer: Self-pay

## 2025-01-01 VITALS — BP 173/75

## 2025-01-01 DIAGNOSIS — I10 Essential (primary) hypertension: Secondary | ICD-10-CM

## 2025-01-01 LAB — BASIC METABOLIC PANEL WITH GFR
Anion gap: 13 (ref 5–15)
BUN: 10 mg/dL (ref 8–23)
CO2: 25 mmol/L (ref 22–32)
Calcium: 9.4 mg/dL (ref 8.9–10.3)
Chloride: 102 mmol/L (ref 98–111)
Creatinine, Ser: 0.86 mg/dL (ref 0.44–1.00)
GFR, Estimated: >60 mL/min
Glucose, Bld: 108 mg/dL — ABNORMAL HIGH (ref 70–99)
Potassium: 4 mmol/L (ref 3.5–5.1)
Sodium: 139 mmol/L (ref 135–145)

## 2025-01-01 LAB — URINALYSIS, ROUTINE W REFLEX MICROSCOPIC
Bilirubin Urine: NEGATIVE
Glucose, UA: NEGATIVE mg/dL
Hgb urine dipstick: NEGATIVE
Ketones, ur: NEGATIVE mg/dL
Leukocytes,Ua: NEGATIVE
Nitrite: NEGATIVE
Protein, ur: NEGATIVE mg/dL
Specific Gravity, Urine: 1.032 — ABNORMAL HIGH (ref 1.005–1.030)
pH: 6 (ref 5.0–8.0)

## 2025-01-01 LAB — TROPONIN T, HIGH SENSITIVITY
Troponin T High Sensitivity: 21 ng/L — ABNORMAL HIGH (ref 0–19)
Troponin T High Sensitivity: 20 ng/L — ABNORMAL HIGH (ref 0–19)

## 2025-01-01 MED ORDER — AMLODIPINE BESYLATE 2.5 MG PO TABS: 2.5000 mg | ORAL_TABLET | Freq: Every day | ORAL | 0 refills | Status: AC

## 2025-01-01 MED ORDER — VALSARTAN 160 MG PO TABS: 160.0000 mg | ORAL_TABLET | Freq: Two times a day (BID) | ORAL | 0 refills | Status: AC

## 2025-01-01 NOTE — Progress Notes (Signed)
 "   MyChart Video Visit    Virtual Visit via Video Note   This format is felt to be most appropriate for this patient at this time. Physical exam was limited by quality of the video and audio technology used for the visit.    Patient location: home Provider location: [facility] Persons involved in the visit: patient, provider  I discussed the limitations of evaluation and management by telemedicine and the availability of in person appointments. The patient expressed understanding and agreed to proceed.  Patient: Kaylee Reyes   DOB: [birth year]   74 y.o. Female  MRN: [id number] Visit Date: 01/01/2025  Today's healthcare provider: Jon Eva, MD   No chief complaint on file.  Subjective    HPI   Discussed the use of AI scribe software for clinical note transcription with the patient, who gave verbal consent to proceed.  History of Present Illness   Kaylee Reyes is a 74 year old female with hypertension who presents with elevated blood pressure.  She reports blood pressure elevation for at least a week, with marked rise over the last few days from about 170 mmHg up to 202/90 mmHg despite taking her medications, which led her to seek emergency care. At one point her blood pressure was high enough that she felt unable to drive and called 088. In the ER her blood pressure was 230/100 mmHg. She took an extra half dose of her medication before or during that episode, which lowered her blood pressure to 170 mmHg by the time she was evaluated. She had scans of her aorta and brain there.  Her current regimen is valsartan  80 mg twice daily, amlodipine  2.5 mg once daily, and torsemide  10 mg daily. She has tried changing medication timing, including taking amlodipine  at night and splitting torsemide , without adequate blood pressure control. She develops ankle swelling with higher doses of amlodipine , so her dose is limited. Her blood pressure had been around 145 mmHg  on this regimen until recently, when it became uncontrolled.  She has neuropathy. She recently changed insurance to [facility] and needs this visit to obtain approval for her amlodipine  prescription. Her lower (diastolic) pressures have not changed significantly aside from the recent spike. She continues to experience ankle swelling with attempts to increase amlodipine .       Review of Systems      Objective    BP (!) 173/75 Comment: home reading     Physical Exam Constitutional:      General: She is not in acute distress.    Appearance: Normal appearance.  HENT:     Head: Normocephalic.  Pulmonary:     Effort: Pulmonary effort is normal. No respiratory distress.  Neurological:     Mental Status: She is alert and oriented to person, place, and time. Mental status is at baseline.        Assessment & Plan     Problem List Items Addressed This Visit       Cardiovascular and Mediastinum   Primary hypertension - Primary   Relevant Medications   valsartan  (DIOVAN ) 160 MG tablet   amLODipine  (NORVASC ) 2.5 MG tablet        Essential hypertension Chronic and uncontrolled. Recent exacerbation with blood pressure readings up to 202/90 mmHg. Current regimen includes valsartan  80 mg twice daily, amlodipine  2.5 mg daily, and torsemide  10 mg daily. Recent ER visit ruled out acute complications. Current blood pressure remains elevated at 173/75 mmHg. Amlodipine  dose is limited due  to ankle swelling. Decision made to increase valsartan  dose to improve blood pressure control without exacerbating ankle swelling. - Increased valsartan  to 160 mg twice daily. - Maintained current doses of amlodipine  and torsemide . - Scheduled follow-up appointment with Doctor Gasper in one month for blood pressure reassessment. - Instructed to monitor blood pressure twice in the morning and twice in the evening and keep a log for the next visit. - Sent prescriptions for valsartan  and amlodipine  to  [facility].        Meds ordered this encounter  Medications   valsartan  (DIOVAN ) 160 MG tablet    Sig: Take 1 tablet (160 mg total) by mouth 2 (two) times daily.    Dispense:  180 tablet    Refill:  0   amLODipine  (NORVASC ) 2.5 MG tablet    Sig: Take 1 tablet (2.5 mg total) by mouth daily.    Dispense:  90 tablet    Refill:  0     Return in about 4 weeks (around 01/29/2025) for BP f/u, With PCP.     I discussed the assessment and treatment plan with the patient. The patient was provided an opportunity to ask questions and all were answered. The patient agreed with the plan and demonstrated an understanding of the instructions.   The patient was advised to call back or seek an in-person evaluation if the symptoms worsen or if the condition fails to improve as anticipated.   Jon Eva, MD [facility] [phone] (phone) [phone] (fax)  [facility]   "

## 2025-01-01 NOTE — Telephone Encounter (Signed)
 FYI Only or Action Required?: FYI only for provider: appointment scheduled on virtual this morning with Dr. Myrla.  Patient was last seen in primary care on 06/15/2024 by Gasper Nancyann BRAVO, MD.  Called Nurse Triage reporting Hypertension. Pressure behind eyes  Symptoms began ongoing - went to ED last night by EMS when bp was 230/100, with Chest pain, HA.  Interventions attempted: Other: went to ED. Took bp medications.  Symptoms are: BP is creeping back up today.  Pressure behind eyes.  Triage Disposition: See PCP When Office is Open (Within 3 Days) Pt wants to be seen today.  Patient/caregiver understands and will follow disposition?: yes                  Reason for Triage: The pt reports pressure behind her eyes that's causing a headache. She was seen yesterday at [facility] at [facility] for bp reading of 230/100. At the time she was experiencing nausea, chest pain, and a severe headache. The pt now needs to schedule a follow up so the provider can review her medications. She reports her car cannot make it over the hill near her home, limiting her ability to travel. She would like to know whether a virtual visit can be arranged.  Reason for Disposition  Systolic BP >= 160 OR Diastolic >= 100  Answer Assessment - Initial Assessment Questions 1. BLOOD PRESSURE: What is your blood pressure? Did you take at least two measurements 5 minutes apart?     7 am 171/80 after taking 80 mg valsartan  2.5mg  Amlodipine  and 10 mg lasix bp was 150/76 2. ONSET: When did you take your blood pressure?     2 times this morning 3. HOW: How did you take your blood pressure? (e.g., automatic home BP monitor, visiting nurse)     Home BP monitor 4. HISTORY: Do you have a history of high blood pressure?     yes 5. MEDICINES: Are you taking any medicines for blood pressure? Have you missed any doses recently?     Yes - medications were taken 6. OTHER SYMPTOMS: Do you have any  symptoms? (e.g., blurred vision, chest pain, difficulty breathing, headache, weakness)     Pressure behind eyes  Protocols used: Blood Pressure - High-A-AH

## 2025-01-01 NOTE — Discharge Instructions (Addendum)
 You may take over-the-counter Tylenol  1000 mg every 6 hours as needed for pain.  Your blood pressure improved here with just your home medications.  I recommend you continue your regimen as prescribed and follow-up closely with your outpatient doctor for further recommendations.  Your cardiac labs today showed no sign of heart attack.  Your CT scan showed no bleeding in the brain, no aortic dissection and no blood clot in your lungs.  The rest of your lab work and urine showed no signs of endorgan damage from your elevated blood pressure.  You had normal kidney function and no blood or protein in your urine.

## 2025-01-01 NOTE — Telephone Encounter (Signed)
 Noted.

## 2025-01-28 ENCOUNTER — Ambulatory Visit: Admitting: Family Medicine
